# Patient Record
Sex: Female | Born: 1940 | Race: White | Hispanic: No | Smoking: Never smoker
Health system: Southern US, Community
[De-identification: ages and names within clinical notes are randomized; demographics above are authoritative.]

## PROBLEM LIST (undated history)

## (undated) DIAGNOSIS — Z9289 Personal history of other medical treatment: Secondary | ICD-10-CM

## (undated) DIAGNOSIS — I447 Left bundle-branch block, unspecified: Secondary | ICD-10-CM

## (undated) DIAGNOSIS — M47812 Spondylosis without myelopathy or radiculopathy, cervical region: Secondary | ICD-10-CM

## (undated) DIAGNOSIS — E785 Hyperlipidemia, unspecified: Secondary | ICD-10-CM

## (undated) DIAGNOSIS — I471 Supraventricular tachycardia, unspecified: Secondary | ICD-10-CM

## (undated) DIAGNOSIS — N189 Chronic kidney disease, unspecified: Secondary | ICD-10-CM

## (undated) DIAGNOSIS — I4819 Other persistent atrial fibrillation: Secondary | ICD-10-CM

## (undated) DIAGNOSIS — E039 Hypothyroidism, unspecified: Secondary | ICD-10-CM

## (undated) DIAGNOSIS — J40 Bronchitis, not specified as acute or chronic: Secondary | ICD-10-CM

## (undated) DIAGNOSIS — M79606 Pain in leg, unspecified: Secondary | ICD-10-CM

## (undated) DIAGNOSIS — I5042 Chronic combined systolic (congestive) and diastolic (congestive) heart failure: Secondary | ICD-10-CM

## (undated) DIAGNOSIS — M47816 Spondylosis without myelopathy or radiculopathy, lumbar region: Secondary | ICD-10-CM

## (undated) DIAGNOSIS — C801 Malignant (primary) neoplasm, unspecified: Secondary | ICD-10-CM

## (undated) DIAGNOSIS — I10 Essential (primary) hypertension: Secondary | ICD-10-CM

## (undated) DIAGNOSIS — F419 Anxiety disorder, unspecified: Secondary | ICD-10-CM

## (undated) HISTORY — DX: Spondylosis without myelopathy or radiculopathy, lumbar region: M47.816

## (undated) HISTORY — DX: Supraventricular tachycardia, unspecified: I47.10

## (undated) HISTORY — PX: EYE SURGERY: SHX253

## (undated) HISTORY — DX: Hypothyroidism, unspecified: E03.9

## (undated) HISTORY — DX: Bronchitis, not specified as acute or chronic: J40

## (undated) HISTORY — DX: Essential (primary) hypertension: I10

## (undated) HISTORY — DX: Chronic kidney disease, unspecified: N18.9

## (undated) HISTORY — DX: Left bundle-branch block, unspecified: I44.7

## (undated) HISTORY — DX: Anxiety disorder, unspecified: F41.9

## (undated) HISTORY — DX: Hyperlipidemia, unspecified: E78.5

## (undated) HISTORY — DX: Spondylosis without myelopathy or radiculopathy, cervical region: M47.812

## (undated) HISTORY — DX: Supraventricular tachycardia: I47.1

---

## 2001-09-09 ENCOUNTER — Ambulatory Visit (HOSPITAL_COMMUNITY): Admission: RE | Admit: 2001-09-09 | Discharge: 2001-09-09 | Payer: Self-pay | Admitting: Neurosurgery

## 2001-09-09 ENCOUNTER — Encounter: Payer: Self-pay | Admitting: Neurosurgery

## 2001-10-01 ENCOUNTER — Encounter: Admission: RE | Admit: 2001-10-01 | Discharge: 2001-10-01 | Payer: Self-pay | Admitting: Neurosurgery

## 2001-10-01 ENCOUNTER — Encounter: Payer: Self-pay | Admitting: Neurosurgery

## 2001-10-17 ENCOUNTER — Encounter: Payer: Self-pay | Admitting: Neurosurgery

## 2001-10-17 ENCOUNTER — Encounter: Admission: RE | Admit: 2001-10-17 | Discharge: 2001-10-17 | Payer: Self-pay | Admitting: Neurosurgery

## 2001-11-01 ENCOUNTER — Encounter: Admission: RE | Admit: 2001-11-01 | Discharge: 2001-11-01 | Payer: Self-pay | Admitting: Neurosurgery

## 2001-11-01 ENCOUNTER — Encounter: Payer: Self-pay | Admitting: Neurosurgery

## 2005-03-08 ENCOUNTER — Ambulatory Visit: Payer: Self-pay | Admitting: Family Medicine

## 2005-03-14 ENCOUNTER — Ambulatory Visit: Payer: Self-pay | Admitting: Family Medicine

## 2005-12-04 ENCOUNTER — Ambulatory Visit: Payer: Self-pay | Admitting: Family Medicine

## 2006-07-16 ENCOUNTER — Ambulatory Visit: Payer: Self-pay | Admitting: Family Medicine

## 2007-08-07 ENCOUNTER — Ambulatory Visit: Payer: Self-pay | Admitting: Family Medicine

## 2008-01-31 ENCOUNTER — Other Ambulatory Visit: Payer: Self-pay

## 2008-01-31 ENCOUNTER — Inpatient Hospital Stay: Payer: Self-pay | Admitting: Internal Medicine

## 2008-02-01 ENCOUNTER — Other Ambulatory Visit: Payer: Self-pay

## 2008-08-26 ENCOUNTER — Ambulatory Visit: Payer: Self-pay | Admitting: Family Medicine

## 2009-07-07 ENCOUNTER — Ambulatory Visit: Payer: Self-pay | Admitting: Ophthalmology

## 2009-07-19 ENCOUNTER — Ambulatory Visit: Payer: Self-pay | Admitting: Ophthalmology

## 2009-08-30 ENCOUNTER — Ambulatory Visit: Payer: Self-pay | Admitting: Family Medicine

## 2009-12-13 ENCOUNTER — Ambulatory Visit: Payer: Self-pay | Admitting: Family Medicine

## 2010-10-11 ENCOUNTER — Ambulatory Visit: Payer: Self-pay | Admitting: Unknown Physician Specialty

## 2011-11-09 ENCOUNTER — Ambulatory Visit: Payer: Self-pay | Admitting: Unknown Physician Specialty

## 2012-02-21 HISTORY — PX: OTHER SURGICAL HISTORY: SHX169

## 2012-02-23 ENCOUNTER — Inpatient Hospital Stay: Payer: Self-pay | Admitting: Internal Medicine

## 2012-02-23 LAB — CBC
HGB: 14.9 g/dL (ref 12.0–16.0)
MCV: 94 fL (ref 80–100)
RBC: 4.6 10*6/uL (ref 3.80–5.20)
WBC: 7.3 10*3/uL (ref 3.6–11.0)

## 2012-02-23 LAB — CK TOTAL AND CKMB (NOT AT ARMC)
CK, Total: 66 U/L (ref 21–215)
CK, Total: 65 U/L (ref 21–215)
CK-MB: 1.2 ng/mL (ref 0.5–3.6)

## 2012-02-23 LAB — COMPREHENSIVE METABOLIC PANEL
Albumin: 4 g/dL (ref 3.4–5.0)
Anion Gap: 8 (ref 7–16)
BUN: 20 mg/dL — ABNORMAL HIGH (ref 7–18)
Bilirubin,Total: 0.5 mg/dL (ref 0.2–1.0)
Chloride: 111 mmol/L — ABNORMAL HIGH (ref 98–107)
Co2: 23 mmol/L (ref 21–32)
Glucose: 136 mg/dL — ABNORMAL HIGH (ref 65–99)
Potassium: 4.1 mmol/L (ref 3.5–5.1)
SGPT (ALT): 21 U/L
Sodium: 142 mmol/L (ref 136–145)

## 2012-02-23 LAB — PROTIME-INR: Prothrombin Time: 12.8 secs (ref 11.5–14.7)

## 2012-02-23 LAB — TROPONIN I: Troponin-I: 0.1 ng/mL — ABNORMAL HIGH

## 2012-02-23 LAB — APTT: Activated PTT: 70.4 secs — ABNORMAL HIGH (ref 23.6–35.9)

## 2012-02-24 DIAGNOSIS — R000 Tachycardia, unspecified: Secondary | ICD-10-CM

## 2012-02-24 LAB — BASIC METABOLIC PANEL
BUN: 18 mg/dL (ref 7–18)
Calcium, Total: 8.8 mg/dL (ref 8.5–10.1)
Chloride: 109 mmol/L — ABNORMAL HIGH (ref 98–107)
EGFR (African American): >60
Osmolality: 287 (ref 275–301)
Potassium: 3.4 mmol/L — ABNORMAL LOW (ref 3.5–5.1)
Sodium: 143 mmol/L (ref 136–145)

## 2012-02-24 LAB — CK TOTAL AND CKMB (NOT AT ARMC): CK, Total: 47 U/L (ref 21–215)

## 2012-02-24 LAB — CBC WITH DIFFERENTIAL/PLATELET
Basophil #: 0 10*3/uL (ref 0.0–0.1)
Basophil %: 0.4 %
Eosinophil #: 0.1 10*3/uL (ref 0.0–0.7)
Eosinophil %: 1.2 %
HCT: 38 % (ref 35.0–47.0)
HGB: 13.2 g/dL (ref 12.0–16.0)
Lymphocyte %: 26.3 %
MCH: 32.3 pg (ref 26.0–34.0)
Monocyte #: 0.7 x10 3/mm (ref 0.2–0.9)
Neutrophil #: 6.8 10*3/uL — ABNORMAL HIGH (ref 1.4–6.5)
Neutrophil %: 65.8 %
Platelet: 187 10*3/uL (ref 150–440)
RDW: 13.5 % (ref 11.5–14.5)

## 2012-02-24 LAB — MAGNESIUM
Magnesium: 1.9 mg/dL
Magnesium: 1.8 mg/dL

## 2012-02-24 LAB — TROPONIN I: Troponin-I: 0.08 ng/mL — ABNORMAL HIGH

## 2012-02-24 LAB — APTT
Activated PTT: 65.9 secs — ABNORMAL HIGH (ref 23.6–35.9)
Activated PTT: 61.2 secs — ABNORMAL HIGH (ref 23.6–35.9)

## 2012-02-24 LAB — T4, FREE: Free Thyroxine: 1.42 ng/dL (ref 0.76–1.46)

## 2012-02-26 ENCOUNTER — Telehealth: Payer: Self-pay | Admitting: Physician Assistant

## 2012-02-26 ENCOUNTER — Other Ambulatory Visit: Payer: Self-pay | Admitting: Physician Assistant

## 2012-02-26 ENCOUNTER — Telehealth: Payer: Self-pay | Admitting: Internal Medicine

## 2012-02-26 DIAGNOSIS — R002 Palpitations: Secondary | ICD-10-CM

## 2012-02-26 NOTE — Telephone Encounter (Addendum)
Rec'd a call from Dr Johney Frame. [name] was seen in [place] and is coming to [facility] 5/7 for procedure. She is to be at [facility] at 5:30 am. Nehemiah Settle has H&P with her.   Called [facility] to let them know, no answer.   Tried to write orders, they are signed and held.  Left note for Lillia Abed and extenders to let them know situation.

## 2012-02-26 NOTE — Telephone Encounter (Signed)
Pt's dtr calling re procedure to be scheduled for pt this week per dr allred who saw her at [facility]

## 2012-02-26 NOTE — Telephone Encounter (Signed)
Pt is scheduled for EP study and ablation with Dr Ladona Ridgel 02/27/12 7:30 am by Dr Ladona Ridgel. She will arrive to short stay at 5:30am. She will hold toprol overnight tonight. Orders for EP study and ablation will need to be placed in the am.

## 2012-02-26 NOTE — Telephone Encounter (Signed)
Patient called stated Dr.Allred saw her this past Saturday and was told she would get a phone call about a procedure that needs to be scheduled.Message fowarded to Dr.Allred's nurse.

## 2012-02-27 ENCOUNTER — Encounter (HOSPITAL_COMMUNITY): Payer: Self-pay | Admitting: *Deleted

## 2012-02-27 ENCOUNTER — Ambulatory Visit (HOSPITAL_COMMUNITY)
Admission: RE | Admit: 2012-02-27 | Discharge: 2012-02-28 | Disposition: A | Discharge status: Home / Self Care | Payer: Medicare Other | Source: Ambulatory Visit | Attending: Internal Medicine | Admitting: Internal Medicine

## 2012-02-27 ENCOUNTER — Encounter (HOSPITAL_COMMUNITY)
Admission: RE | Disposition: A | Discharge status: Home / Self Care | Payer: Self-pay | Source: Ambulatory Visit | Attending: Internal Medicine

## 2012-02-27 DIAGNOSIS — I471 Supraventricular tachycardia: Secondary | ICD-10-CM

## 2012-02-27 DIAGNOSIS — Z8679 Personal history of other diseases of the circulatory system: Secondary | ICD-10-CM

## 2012-02-27 DIAGNOSIS — I498 Other specified cardiac arrhythmias: Secondary | ICD-10-CM | POA: Insufficient documentation

## 2012-02-27 DIAGNOSIS — I447 Left bundle-branch block, unspecified: Secondary | ICD-10-CM | POA: Insufficient documentation

## 2012-02-27 HISTORY — PX: ELECTROPHYSIOLOGY STUDY: SHX5467

## 2012-02-27 HISTORY — PX: SUPRAVENTRICULAR TACHYCARDIA ABLATION: SHX5492

## 2012-02-27 LAB — CBC
MCH: 32.8 pg (ref 26.0–34.0)
MCHC: 36.1 g/dL — ABNORMAL HIGH (ref 30.0–36.0)
MCV: 90.7 fL (ref 78.0–100.0)
Platelets: 191 10*3/uL (ref 150–400)
WBC: 7.5 10*3/uL (ref 4.0–10.5)

## 2012-02-27 LAB — APTT: aPTT: 26 seconds (ref 24–37)

## 2012-02-27 LAB — BASIC METABOLIC PANEL
CO2: 25 mEq/L (ref 19–32)
Calcium: 9.9 mg/dL (ref 8.4–10.5)
Creatinine, Ser: 0.71 mg/dL (ref 0.50–1.10)
GFR calc non Af Amer: 86 mL/min — ABNORMAL LOW (ref >90)

## 2012-02-27 SURGERY — ELECTROPHYSIOLOGY STUDY
Anesthesia: LOCAL

## 2012-02-27 MED ORDER — MIDAZOLAM HCL 5 MG/5ML IJ SOLN
INTRAMUSCULAR | Status: AC
  Filled 2012-02-27: qty 5

## 2012-02-27 MED ORDER — HYDROXYUREA 500 MG PO CAPS
ORAL_CAPSULE | ORAL | Status: AC
  Filled 2012-02-27: qty 1

## 2012-02-27 MED ORDER — ACETAMINOPHEN 325 MG PO TABS: 650.0000 mg | ORAL_TABLET | ORAL | Status: DC | PRN

## 2012-02-27 MED ORDER — METOPROLOL SUCCINATE ER 50 MG PO TB24
50.0000 mg | ORAL_TABLET | Freq: Every day | ORAL | Status: DC
  Administered 2012-02-27: 50 mg via ORAL
  Filled 2012-02-27 (×2): qty 1

## 2012-02-27 MED ORDER — POTASSIUM CHLORIDE ER 10 MEQ PO TBCR
10.0000 meq | EXTENDED_RELEASE_TABLET | Freq: Every day | ORAL | Status: DC
  Administered 2012-02-27: 10 meq via ORAL
  Filled 2012-02-27 (×2): qty 1

## 2012-02-27 MED ORDER — LEVOTHYROXINE SODIUM 112 MCG PO TABS
112.0000 ug | ORAL_TABLET | Freq: Every day | ORAL | Status: DC
  Filled 2012-02-27 (×2): qty 1

## 2012-02-27 MED ORDER — ASPIRIN EC 81 MG PO TBEC
81.0000 mg | DELAYED_RELEASE_TABLET | Freq: Every day | ORAL | Status: DC
  Administered 2012-02-27: 81 mg via ORAL
  Filled 2012-02-27 (×2): qty 1

## 2012-02-27 MED ORDER — SIMVASTATIN 20 MG PO TABS
20.0000 mg | ORAL_TABLET | Freq: Every day | ORAL | Status: DC
  Administered 2012-02-27: 20 mg via ORAL
  Filled 2012-02-27 (×2): qty 1

## 2012-02-27 MED ORDER — BUSPIRONE HCL 15 MG PO TABS
7.5000 mg | ORAL_TABLET | Freq: Two times a day (BID) | ORAL | Status: DC
  Administered 2012-02-27 (×2): 7.5 mg via ORAL
  Filled 2012-02-27 (×5): qty 1

## 2012-02-27 MED ORDER — ADULT MULTIVITAMIN W/MINERALS CH
1.0000 | ORAL_TABLET | Freq: Every day | ORAL | Status: DC
  Administered 2012-02-27: 1 via ORAL
  Filled 2012-02-27 (×2): qty 1

## 2012-02-27 MED ORDER — HYDROCHLOROTHIAZIDE 12.5 MG PO CAPS
12.5000 mg | ORAL_CAPSULE | Freq: Every day | ORAL | Status: DC
  Administered 2012-02-27: 12.5 mg via ORAL
  Filled 2012-02-27 (×2): qty 1

## 2012-02-27 MED ORDER — LOSARTAN POTASSIUM 50 MG PO TABS
100.0000 mg | ORAL_TABLET | Freq: Every day | ORAL | Status: DC
  Administered 2012-02-27: 100 mg via ORAL
  Filled 2012-02-27 (×2): qty 2

## 2012-02-27 MED ORDER — LOSARTAN POTASSIUM-HCTZ 100-12.5 MG PO TABS: 1.0000 | ORAL_TABLET | Freq: Every day | ORAL | Status: DC

## 2012-02-27 MED ORDER — FENTANYL CITRATE 0.05 MG/ML IJ SOLN
INTRAMUSCULAR | Status: AC
  Filled 2012-02-27: qty 2

## 2012-02-27 MED ORDER — FAMOTIDINE 20 MG PO TABS
20.0000 mg | ORAL_TABLET | Freq: Two times a day (BID) | ORAL | Status: DC
  Administered 2012-02-27: 20 mg via ORAL
  Filled 2012-02-27 (×3): qty 1

## 2012-02-27 MED ORDER — LORATADINE 10 MG PO TABS
10.0000 mg | ORAL_TABLET | Freq: Every day | ORAL | Status: DC
  Administered 2012-02-27: 10 mg via ORAL
  Filled 2012-02-27 (×2): qty 1

## 2012-02-27 MED ORDER — SODIUM CHLORIDE 0.9 % IV SOLN: 250.0000 mL | INTRAVENOUS | Status: DC | PRN

## 2012-02-27 MED ORDER — HEPARIN (PORCINE) IN NACL 2-0.9 UNIT/ML-% IJ SOLN
INTRAMUSCULAR | Status: AC
  Filled 2012-02-27: qty 1000

## 2012-02-27 MED ORDER — ONDANSETRON HCL 4 MG/2ML IJ SOLN: 4.0000 mg | Freq: Four times a day (QID) | INTRAMUSCULAR | Status: DC | PRN

## 2012-02-27 MED ORDER — ACETAMINOPHEN 325 MG PO TABS: 650.0000 mg | ORAL_TABLET | Freq: Four times a day (QID) | ORAL | Status: DC | PRN

## 2012-02-27 MED ORDER — SODIUM CHLORIDE 0.9 % IV SOLN
INTRAVENOUS | Status: DC
  Administered 2012-02-27: 1000 mL via INTRAVENOUS

## 2012-02-27 MED ORDER — BUPIVACAINE HCL (PF) 0.25 % IJ SOLN
INTRAMUSCULAR | Status: AC
  Filled 2012-02-27: qty 60

## 2012-02-27 MED ORDER — CYCLOBENZAPRINE HCL 10 MG PO TABS
10.0000 mg | ORAL_TABLET | Freq: Every day | ORAL | Status: DC
Start: 2012-02-27 — End: 2012-02-28
  Administered 2012-02-27: 10 mg via ORAL
  Filled 2012-02-27 (×2): qty 1

## 2012-02-27 NOTE — Op Note (Signed)
EPS/RFA of NS AVNRT performed without immediate complication. [id number].

## 2012-02-27 NOTE — Op Note (Signed)
[name], [name] NO.:  [id number]  MEDICAL RECORD NO.:  [id number]  LOCATION:  3729                         FACILITY:  [facility]  PHYSICIAN:  Doylene Canning. 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  INTRODUCTION:  The patient is a very pleasant 71 year old woman with recurrent tachy palpitations and documented left bundle-branch block tachycardia at rates of over 200 beats per minute.  She presented several days ago and was treated with amiodarone initially and was discharged home.  She now presents for catheter ablation.  PROCEDURE:  After informed was obtained, the patient was taken to the diagnostic EP lab in a fasting state.  After usual preparation and draping, intravenous fentanyl and midazolam were given for sedation.  A 6-French hexapolar catheter was inserted percutaneously into the right jugular vein and advanced to coronary sinus.  A 6-French quadripolar catheter was inserted percutaneously in the right femoral vein and advanced to the right ventricle.  A 6-French quadripolar catheter was inserted percutaneously in the right femoral vein and advanced to His bundle region.  After measurement of the basic intervals, rapid ventricular pacing was carried out from the right ventricle demonstrating VA dissociation at 600 msec.  Programmed ventricular stimulation was carried out from the right ventricle at S1 at base drive cycle length of 147 msec.  S1, S2; S1, S2, S3; and S1, S2, S3, S4 stimuli were delivered with no inducible VT induced.  At this point, programmed atrial stimulation was carried out from the coronary sinus in the right atrium  at base drive cycle length of 829, 500, and 400 msec. The S1, S2 interval was stepwise decreased down to the AV node ERP, and then on down to atrial refractoriness.  During programmed atrial stimulation, the PR interval was greater than the RR interval.  There are multiple AH jumps, echo beats, double echo beats, and triple echo beats, but there was no inducible sustained tachycardia.  Rapid atrial pacing was carried out from the coronary sinus and high right atrium, paced cycle length of 600 msec and stepwise decreased down to 200 msec where atrial refractoriness was observed.  During rapid atrial pacing, the AV Wenckebach cycle length was 390 msec.  At this point, isoproterenol was infused at rates from 1-4 mcg per minute, and additional rapid atrial and ventricular pacing, programmed atrial and ventricular stimulation were carried out.  There was no sustained tachycardia induced.  At this point, a decision was made because there was no inducible ventricular tachycardia and because there was no evidence of accessory pathway conduction, and because there was evidence of dual AV nodal physiology as well as nonsustained SVT, the decision was made to proceed with slow pathway modification.  The 7-French quadripolar ablation catheter was inserted percutaneously into the right femoral vein and advanced to the right atrium.  Mapping of Koch's triangle was carried out.  It was oriented vertically and was extraordinarily small.  There were 2 catheter widths between the AV node and the slow pathway region.  Additional displacements of the catheter resulted in no atrial signal.  At this point, a total of 5 very brief RF energy applications were delivered resulting in accelerated junctional rhythm.  At times, there was VA block noted and the ablation catheter was immediately removed and RF was discontinued.  Following the final RF energy application, there remained AH jumps but no echo beats,  no inducible SVT, and the PR interval was less than the RR interval.  The decision was made at this point because of concerns of resulting in complete heart block and because we had not a very good definite endpoint to discontinue any additional ablation attempts.  It appeared a slow pathway modification had been successfully completed.  The catheters were then removed.  Hemostasis was assured, and the patient was returned to her room in satisfactory condition.  COMPLICATIONS:  There are no immediate procedure complications.  RESULTS:  A.  Baseline ECG:  Baseline ECG demonstrates sinus rhythm with left bundle-branch block. B.  Baseline intervals:  QRS duration was 175 msec, HV interval 65 msec, the AH interval was 115 msec.  The sinus node cycle length rather was 828 msec. C.  Rapid ventricular pacing:  At baseline, rapid ventricular pacing demonstrated VA dissociation at 600 msec.  On isoproterenol,  VA dissociation was present at 500 msec.  Rapid ventricular pacing was carried out down to 270 msec.  There was no inducible VT. D.  Programmed ventricular stimulation:  Programmed ventricular stimulation was carried out from the right ventricle at a base drive cycle length of 161 msec.  The S1-S2 interval stepwise decreased down to 220 msec where ventricular refractoriness was observed.  During programmed ventricular stimulation, there was VA dissociation at baseline, and there was no inducible SVT.  It should be noted that additional programmed stimulation was carried out from the ventricle and attempts to induce ventricular tachycardia with S1, S2, S3 and S1, S2, S3, S4 stimuli carried out, and decremented all way down to ventricular refractoriness.  Despite an aggressive pacing protocol, there was no inducible VT. E.  Rapid atrial pacing:  Rapid atrial pacing was carried out from the coronary sinus in the right atrium at base drive cycle length of 096 msec, stepwise decreased down  to 200 msec.  There were no sustained inducible arrhythmias.  The PR interval was greater than the RR interval initially prior to catheter ablation. F.  Programmed atrial stimulation:  Programmed atrial stimulation was carried out from the coronary sinus as well as the high right atrium at base drive cycle lengths of 045, 500, and 400 msec.  S1, S2 and S1, S2, S3 stimuli were delivered.  At baseline, the AV node ERP was noted at 600/340.  During programmed atrial stimulation with and without isoproterenol, there were echo beats, double echo beats, and triple echo beats.  There was also nonsustained SVT.  This was left bundle branch block SVT with one-to-one VA conduction.  The tachycardia would always terminate with block in the AV node. G.  Arrhythmias observed. 1. Nonsustained AV node reentry tachycardia, duration was     nonsustained, termination was spontaneous, QRS morphology was left     bundle-branch block.     a.     Mapping.  Mapping demonstrated a almost vertical Koch's      triangle which was exceedingly small.  b.     RF energy application.  A total of 5 brief RF energy      applications were delivered into Koch's triangle.  RF energy was      applied approximately 1 and 2 catheter widths way from the AV      node.  Accelerated junctional rhythm was observed with transient      VA block resulting in immediate discontinuation of the ablation of      the RF energy and movement of the ablation catheter.  CONCLUSION:  This study demonstrates inducible nonsustained AV node reentry tachycardia and apparent slow pathway modification.  The results of ablation are not as clear cut in that the patient's tachycardia could not be sustained.  With all of the above criteria, however, it was believed that the most likely explanation for the patient's SVT was that she had AV node reentrant tachycardia utilizing a left bundle-branch block aberrancy.  In addition, the patient's Koch's  triangle was unusually small and vertically oriented making ablation quite difficult.     Doylene Canning. Ladona Ridgel, MD     GWT/MEDQ  D:  02/27/2012  T:  02/27/2012  Job:  [id number]  cc:   Harold Hedge, MD Hillis Range, MD

## 2012-02-27 NOTE — H&P (Signed)
[name] is an 71 y.o. female.   Chief Complaint: I am here for an ablation HPI: 71 yo woman with a h/o recurrent tachypalpitations admitted for EPS/RFA of LBBB tachycardia. I have reviewed the history as documented by Dr. Johney Frame. Briefly she has had a h/o tachypalpitations over the past 3 years, refractory to medical therapy on beta blockers. She was in her usual state of health until presenting with a LBBB tachycadia at 220/min. She was given amio and the tachy terminated. She desires to have this ablated if possible as she experiences shortness of breath with her tachypalpitations but has never had syncope.  No past medical history on file.  No past surgical history on file.  No family history on file. Social History:  does not have a smoking history on file. She does not have any smokeless tobacco history on file. Her alcohol and drug histories not on file.  Allergies:  Allergies  Allergen Reactions  . Codeine Nausea And Vomiting  . Erythromycin Swelling  . Penicillins Swelling  . Septra (Sulfamethoxazole W-Trimethoprim) Swelling    Medications Prior to Admission  Medication Sig Dispense Refill  . acetaminophen (TYLENOL) 325 MG tablet Take 650 mg by mouth every 6 (six) hours as needed. For pain      . aspirin EC 81 MG tablet Take 81 mg by mouth daily.      Marland Kitchen atorvastatin (LIPITOR) 20 MG tablet Take 20 mg by mouth daily.      . busPIRone (BUSPAR) 5 MG tablet Take 7.5 mg by mouth 2 (two) times daily.      . Calcium Carbonate (CALTRATE 600 PO) Take 1,800 mg by mouth 3 (three) times daily.      . cyclobenzaprine (FLEXERIL) 10 MG tablet Take 10 mg by mouth daily.      Marland Kitchen levothyroxine (SYNTHROID, LEVOTHROID) 112 MCG tablet Take 112 mcg by mouth daily.      Marland Kitchen loratadine (CLARITIN) 10 MG tablet Take 10 mg by mouth daily.      Marland Kitchen losartan-hydrochlorothiazide (HYZAAR) 100-12.5 MG per tablet Take 1 tablet by mouth daily.      . metoprolol succinate (TOPROL-XL) 50 MG 24 hr tablet Take  50 mg by mouth daily. Take with or immediately following a meal.      . Multiple Vitamin (MULITIVITAMIN WITH MINERALS) TABS Take 1 tablet by mouth daily.      . potassium chloride (K-DUR) 10 MEQ tablet Take 10 mEq by mouth daily.        Results for orders placed during the hospital encounter of 02/27/12 (from the past 48 hour(s))  CBC     Status: Abnormal   Collection Time   02/27/12  6:07 AM      Component Value Range Comment   WBC 7.5  4.0 - 10.5 (K/uL)    RBC 4.61  3.87 - 5.11 (MIL/uL)    Hemoglobin 15.1 (*) 12.0 - 15.0 (g/dL)    HCT 40.9  81.1 - 91.4 (%)    MCV 90.7  78.0 - 100.0 (fL)    MCH 32.8  26.0 - 34.0 (pg)    MCHC 36.1 (*) 30.0 - 36.0 (g/dL)    RDW 78.2  95.6 - 21.3 (%)    Platelets 191  150 - 400 (K/uL)   BASIC METABOLIC PANEL     Status: Abnormal   Collection Time   02/27/12  6:07 AM      Component Value Range Comment   Sodium 138  135 - 145 (  mEq/L)    Potassium 3.8  3.5 - 5.1 (mEq/L)    Chloride 101  96 - 112 (mEq/L)    CO2 25  19 - 32 (mEq/L)    Glucose, Bld 114 (*) 70 - 99 (mg/dL)    BUN 18  6 - 23 (mg/dL)    Creatinine, Ser 1.61  0.50 - 1.10 (mg/dL)    Calcium 9.9  8.4 - 10.5 (mg/dL)    GFR calc non Af Amer 86 (*) >90 (mL/min)    GFR calc Af Amer >90  >90 (mL/min)   APTT     Status: Normal   Collection Time   02/27/12  6:07 AM      Component Value Range Comment   aPTT 26  24 - 37 (seconds)   PROTIME-INR     Status: Normal   Collection Time   02/27/12  6:07 AM      Component Value Range Comment   Prothrombin Time 12.5  11.6 - 15.2 (seconds)    INR 0.91  0.00 - 1.49     No results found.  As noted in the HPI. All systems reviewed and negative except for arthritis  PE  Blood pressure 145/71, pulse 85, temperature 96.9 F (36.1 C), temperature source Oral, resp. rate 18, height 5\' 6"  (1.676 m), weight 152 lb (68.947 kg), SpO2 94.00%. Well appearing NAD HEENT: Unremarkable Neck:  No JVD, no thyromegally Lymphatics:  No adenopathy Back:  No CVA  tenderness Lungs:  Clear HEART:  Regular rate rhythm, no murmurs, no rubs, no clicks Abd:  Flat, positive bowel sounds, no organomegally, no rebound, no guarding Ext:  2 plus pulses, no edema, no cyanosis, no clubbing Skin:  No rashes no nodules Neuro:  CN II through XII intact, motor grossly intact EKG: NSR with LBBB.  Assessment/Plan 1.  Recurrent medically refractory LBBB tachycardia - I have discussed the risk/benfit/goals/expectations of catheter ablation and she wishes to proceed. There is about 5/100 of heart block requiring PPM.   Lewayne Bunting 02/27/2012, 7:12 AM

## 2012-02-28 DIAGNOSIS — I471 Supraventricular tachycardia: Secondary | ICD-10-CM

## 2012-02-28 NOTE — Progress Notes (Signed)
Pt and family provied with d/c instructions and education. Pt and family verbalized understanding. All questions answered. Pt appears stable for discharge. No needs at this time. IV removed with tip intact. Heart monitor returned to front. Pt is d/c home with daughter and left floor via wheelchair. Ramond Craver, Rn

## 2012-02-28 NOTE — Discharge Summary (Signed)
ELECTROPHYSIOLOGY PROCEDURE DISCHARGE SUMMARY    Patient ID: [name],  MRN: [id number], DOB/AGE: 1941-09-23 71 y.o.  Admit date: 02/27/2012 Discharge date: 02/28/2012  Primary Care Physician: No primary provider on file. Primary Cardiologist: Dr. Mariel Kansky  Primary Discharge Diagnosis:  1.  AVNRT s/p RF ablation  Secondary Discharge Diagnosis:  1.  Chronic LBBB  Procedures Performed:  1.  ELECTROPHYSIOLOGY STUDY AND SUPRAVENTRICULAR TACHYCARDIA ABLATION Please see full dictated report by Dr. Lewayne Bunting. Briefly, arrhythmias observed/induced - nonsustained AV node reentry tachycardia, duration was nonsustained, termination was spontaneous, QRS morphology was left bundle-branch block. Mapping demonstrated an almost vertical Koch's triangle which was exceedingly small. RF energy application. A total of 5 brief RF energy applications were delivered into Koch's triangle. RF energy was applied approximately 1 and 2 catheter widths way from the AV node. Accelerated junctional rhythm was observed with transient VA block resulting in immediate discontinuation of the ablation of the RF energy and movement of the ablation catheter. CONCLUSION: This study demonstrates inducible nonsustained AV node reentry tachycardia and apparent slow pathway modification. The results of ablation are not as clear cut in that the patient's tachycardia could not be sustained. With all of the above criteria, however, it was believed that the most likely explanation for the patient's SVT was that she had AV node reentrant tachycardia utilizing a left bundle-branch block aberrancy. In addition, the patient's Koch's triangle was unusually small and vertically oriented making ablation quite difficult.  History and Hospital Course:  Please see admission H&P for full details. Briefly, [name] is a pleasant 71 year old woman with recurrent tachy palpitations x 3 years and documented left bundle-branch block tachycardia at  rates of over 200 beats per minute. She presented to [facility] several days ago and was treated with amiodarone initially then discharged home. She was evaluated by Dr. Johney Frame in consultation and decided to proceed with EP study and possible catheter ablation. She presented for EPS +/- ablation on 02/27/2012. After informed consent was obtained, [name] underwent an EP study with inducible nonsustained AV nodal reentrant tachycardia utilizing a LBBB aberrancy, as outlined above. She then underwent slow pathway modification. The results of ablation are not as clear cut in that the patient's tachycardia could not be sustained. She tolerated the procedure well with no immediate complications. She remains hemodynamically stable, afebrile and has been ambulating around the room without difficulty. Her groin site is intact with no evidence of edema, erythema, warmth, pain, drainage, bleeding or hematoma. Her telemetry shows NSR with rare PVCs. She has been seen, examined and deemed stable for discharge to home today by Dr. Lewayne Bunting.  Discharge Vitals: Blood pressure 112/46, pulse 67, temperature 97.6 F (36.4 C), temperature source Axillary, resp. rate 16, height 5\' 6"  (1.676 m), weight 152 lb (68.947 kg), SpO2 100.00%.   Labs: Lab Results  Component Value Date   WBC 7.5 02/27/2012   HGB 15.1* 02/27/2012   HCT 41.8 02/27/2012   MCV 90.7 02/27/2012   PLT 191 02/27/2012     Lab 02/27/12 0607  NA 138  K 3.8  CL 101  CO2 25  BUN 18  CREATININE 0.71  CALCIUM 9.9  PROT --  BILITOT --  ALKPHOS --  ALT --  AST --  GLUCOSE 114*    Discharge Medications:  Medication List  As of 02/28/2012  7:58 AM   STOP taking these medications     metoprolol succinate 50 MG 24 hr tablet  TAKE these medications     acetaminophen 325 MG tablet   Commonly known as: TYLENOL   Take 650 mg by mouth every 6 (six) hours as needed. For pain      aspirin EC 81 MG tablet   Take 81 mg by mouth  daily.      atorvastatin 20 MG tablet   Commonly known as: LIPITOR   Take 20 mg by mouth daily.      busPIRone 5 MG tablet   Commonly known as: BUSPAR   Take 7.5 mg by mouth 2 (two) times daily.      CALTRATE 600 PO   Take 1,800 mg by mouth 3 (three) times daily.      cyclobenzaprine 10 MG tablet   Commonly known as: FLEXERIL   Take 10 mg by mouth daily.      levothyroxine 112 MCG tablet   Commonly known as: SYNTHROID, LEVOTHROID   Take 112 mcg by mouth daily.      loratadine 10 MG tablet   Commonly known as: CLARITIN   Take 10 mg by mouth daily.      losartan-hydrochlorothiazide 100-12.5 MG per tablet   Commonly known as: HYZAAR   Take 1 tablet by mouth daily.      mulitivitamin with minerals Tabs   Take 1 tablet by mouth daily.      potassium chloride 10 MEQ tablet   Commonly known as: K-DUR   Take 10 mEq by mouth daily.       Disposition:  [name] is being discharged in stable condition.  Follow-up: Follow-up Information    Follow up with Hillis Range, MD. (In 3 months; our office will call to notify you of appointment date and time)    Contact information:   [address] [place] [address] [phone]   Duration of Discharge Encounter: Greater than 30 minutes including physician time.  Signed, Minda Meo., PA-C 02/28/2012, 7:58 AM  Cardiology Attending  Patient seen and examined. Agree with above assessment and plan. Her exam is normal this morning and her Tele demonstrates NSR with LBBB. She will followup as above.  Lewayne Bunting, M.D.

## 2012-02-28 NOTE — Discharge Instructions (Signed)
Keep procedure site clean & dry. If you notice increased pain, swelling, bleeding or pus, contact our office - [facility] [phone].  You may shower, but no soaking baths/hot tubs/pools for 1 week. No driving for 2 days. No lifting over 5 lbs for 5 days.

## 2012-05-27 ENCOUNTER — Ambulatory Visit (INDEPENDENT_AMBULATORY_CARE_PROVIDER_SITE_OTHER): Payer: Medicare Other | Admitting: Internal Medicine

## 2012-05-27 ENCOUNTER — Encounter: Payer: Self-pay | Admitting: Internal Medicine

## 2012-05-27 VITALS — BP 140/92 | HR 82 | Resp 18 | Ht 66.0 in | Wt 157.0 lb

## 2012-05-27 DIAGNOSIS — I471 Supraventricular tachycardia, unspecified: Secondary | ICD-10-CM | POA: Insufficient documentation

## 2012-05-27 DIAGNOSIS — I10 Essential (primary) hypertension: Secondary | ICD-10-CM | POA: Insufficient documentation

## 2012-05-27 DIAGNOSIS — I498 Other specified cardiac arrhythmias: Secondary | ICD-10-CM

## 2012-05-27 NOTE — Patient Instructions (Addendum)
Your physician wants you to follow-up in: 6 months with Dr Taylor in [place]. You will receive a reminder letter in the mail two months in advance. If you don't receive a letter, please call our office to schedule the follow-up appointment.  

## 2012-05-27 NOTE — Assessment & Plan Note (Signed)
Elevated today, though she reports good BP control at home. She does not wish to have her medicines adjusted today.

## 2012-05-27 NOTE — Assessment & Plan Note (Signed)
Doing well s/p ablation No further workup planned  She would like to follow-up with Dr Ladona Ridgel in [place]. I will see her as needed.

## 2012-05-27 NOTE — Progress Notes (Signed)
  [name] is a 71 y.o. female who presents today for routine electrophysiology followup.  Since her SVT ablation by Dr Ladona Ridgel, the patient reports doing very well.  She reports brief tachypalpitations which woke her from sleep a month ago but denies any other sustained arrhythmias.  Today, she denies symptoms of palpitations, chest pain, shortness of breath,  lower extremity edema, dizziness, presyncope, or syncope.  The patient is otherwise without complaint today.   Past Medical History  Diagnosis Date  . SVT (supraventricular tachycardia)     s/p ablation by Dr Ladona Ridgel 5/13  . LBBB (left bundle branch block)   . Hypertension   . Hypothyroid   . Hyperlipidemia    Past Surgical History  Procedure Date  . Eps and ablation for svt 5/13    slow pathway ablation by Dr Ladona Ridgel    Current Outpatient Prescriptions  Medication Sig Dispense Refill  . acetaminophen (TYLENOL) 325 MG tablet Take 650 mg by mouth every 6 (six) hours as needed. For pain      . aspirin EC 81 MG tablet Take 81 mg by mouth daily.      Marland Kitchen atorvastatin (LIPITOR) 20 MG tablet Take 20 mg by mouth daily.      . busPIRone (BUSPAR) 5 MG tablet Take 7.5 mg by mouth 2 (two) times daily.      . Calcium Carbonate (CALTRATE 600 PO) Take 1,800 mg by mouth 3 (three) times daily.      . cyclobenzaprine (FLEXERIL) 10 MG tablet Take 10 mg by mouth daily.      Marland Kitchen levothyroxine (SYNTHROID, LEVOTHROID) 112 MCG tablet Take 112 mcg by mouth daily.      Marland Kitchen loratadine (CLARITIN) 10 MG tablet Take 10 mg by mouth daily.      Marland Kitchen losartan-hydrochlorothiazide (HYZAAR) 100-12.5 MG per tablet Take 1 tablet by mouth daily.      . Multiple Vitamin (MULITIVITAMIN WITH MINERALS) TABS Take 1 tablet by mouth daily.      Marland Kitchen PATADAY 0.2 % SOLN       . potassium chloride (K-DUR) 10 MEQ tablet Take 10 mEq by mouth daily.        Physical Exam: Filed Vitals:   05/27/12 1604  BP: 140/92  Pulse: 82  Resp: 18  Height: 5\' 6"  (1.676 m)  Weight: 157 lb  (71.215 kg)  SpO2: 98%    GEN- The patient is well appearing, alert and oriented x 3 today.   Head- normocephalic, atraumatic Eyes-  Sclera clear, conjunctiva pink Ears- hearing intact Oropharynx- clear Lungs- Clear to ausculation bilaterally, normal work of breathing Heart- Regular rate and rhythm, no murmurs, rubs or gallops, PMI not laterally displaced GI- soft, NT, ND, + BS Extremities- no clubbing, cyanosis, or edema  ekg today reveals sinus rhythm, LBBB Assessment and Plan:

## 2012-12-10 ENCOUNTER — Ambulatory Visit (INDEPENDENT_AMBULATORY_CARE_PROVIDER_SITE_OTHER): Payer: Medicare Other | Admitting: Internal Medicine

## 2012-12-10 ENCOUNTER — Encounter: Payer: Self-pay | Admitting: Internal Medicine

## 2012-12-10 VITALS — BP 158/80 | HR 90 | Ht 66.0 in | Wt 156.0 lb

## 2012-12-10 DIAGNOSIS — I10 Essential (primary) hypertension: Secondary | ICD-10-CM

## 2012-12-10 DIAGNOSIS — R079 Chest pain, unspecified: Secondary | ICD-10-CM

## 2012-12-10 DIAGNOSIS — I471 Supraventricular tachycardia: Secondary | ICD-10-CM

## 2012-12-10 DIAGNOSIS — I498 Other specified cardiac arrhythmias: Secondary | ICD-10-CM

## 2012-12-10 DIAGNOSIS — R0602 Shortness of breath: Secondary | ICD-10-CM

## 2012-12-10 NOTE — Assessment & Plan Note (Signed)
Her blood pressure is elevated today. I've asked her to reduce her salt intake. She is on both metoprolol and amlodipine. She states that her blood pressure has been well-controlled. If her blood pressure remains elevated, we would consider up titration of her calcium channel blocker.

## 2012-12-10 NOTE — Progress Notes (Signed)
HPI Mrs. [name] returns today for followup. She is a very pleasant 72 year old woman with a history of left bundle branch block tachycardia, who underwent electrophysiologic study and catheter ablation of AV node reentrant tachycardia several months ago. In the interim, she has had intermittent palpitations, and heart rates of up 100 or 110 beats per minute. Sometimes her episodes are associated with exertion and sometimes not. She denies chest pain. No peripheral edema. Previously, she would have tachycardia palpitations at rates of over 200 beats per minute. Allergies  Allergen Reactions  . Codeine Nausea And Vomiting  . Erythromycin Swelling  . Penicillins Swelling  . Septra (Sulfamethoxazole W-Trimethoprim) Swelling     Current Outpatient Prescriptions  Medication Sig Dispense Refill  . acetaminophen (TYLENOL) 325 MG tablet Take 650 mg by mouth every 6 (six) hours as needed. For pain      . amLODipine (NORVASC) 2.5 MG tablet Take 2.5 mg by mouth daily.      Marland Kitchen aspirin EC 81 MG tablet Take 81 mg by mouth daily.      Marland Kitchen atorvastatin (LIPITOR) 20 MG tablet Take 20 mg by mouth daily.      . busPIRone (BUSPAR) 5 MG tablet Take 7.5 mg by mouth 2 (two) times daily.      . cholecalciferol (VITAMIN D) 1000 UNITS tablet Take 2,000 Units by mouth daily.      . cyclobenzaprine (FLEXERIL) 10 MG tablet Take 10 mg by mouth daily.      Marland Kitchen levothyroxine (SYNTHROID, LEVOTHROID) 112 MCG tablet Take 112 mcg by mouth daily.      Marland Kitchen loratadine (CLARITIN) 10 MG tablet Take 10 mg by mouth daily.      Marland Kitchen losartan-hydrochlorothiazide (HYZAAR) 100-12.5 MG per tablet Take 1 tablet by mouth daily.      . metoprolol succinate (TOPROL-XL) 50 MG 24 hr tablet Take 50 mg by mouth daily. Take with or immediately following a meal.      . Multiple Vitamin (MULITIVITAMIN WITH MINERALS) TABS Take 1 tablet by mouth daily.      . potassium chloride (K-DUR) 10 MEQ tablet Take 10 mEq by mouth daily.       No current  facility-administered medications for this visit.     Past Medical History  Diagnosis Date  . SVT (supraventricular tachycardia)     s/p ablation by Dr Ladona Ridgel 5/13  . LBBB (left bundle branch block)   . Hypertension   . Hypothyroid   . Hyperlipidemia     ROS:   All systems reviewed and negative except as noted in the HPI.   Past Surgical History  Procedure Laterality Date  . Eps and ablation for svt  5/13    slow pathway ablation by Dr Ladona Ridgel     History reviewed. No pertinent family history.   History   Social History  . Marital Status: Married    Spouse Name: N/A    Number of Children: N/A  . Years of Education: N/A   Occupational History  . Not on file.   Social History Main Topics  . Smoking status: Never Smoker   . Smokeless tobacco: Not on file  . Alcohol Use: No  . Drug Use: No  . Sexually Active: Not on file   Other Topics Concern  . Not on file   Social History Narrative   Lives in [place]     BP 158/80  Ht 5\' 6"  (1.676 m)  Physical Exam:  Well appearing 72 year old woman, NAD HEENT: Unremarkable Neck:  No JVD, no thyromegally Lungs:  Clear with no wheezes, rales, or rhonchi. HEART:  Regular rate rhythm, no murmurs, no rubs, no clicks Abd:  soft, positive bowel sounds, no organomegally, no rebound, no guarding Ext:  2 plus pulses, no edema, no cyanosis, no clubbing Skin:  No rashes no nodules Neuro:  CN II through XII intact, motor grossly intact  EKG Normal sinus rhythm with left bundle branch block  Assess/Plan:

## 2012-12-10 NOTE — Assessment & Plan Note (Signed)
She has had no recurrent supraventricular tachycardia. It appears that she is having sinus tachycardia. We discussed the mechanism of this. We discussed the possibility of additional diagnostic evaluation which I have not recommended versus watchful waiting and reinitiation of her regular daily exercise program. She states that she would like to start exercising and I've encouraged her to do so. Walking on flat ground has been recommended. I'll plan to see the patient back in several months.

## 2012-12-10 NOTE — Patient Instructions (Addendum)
Your physician wants you to follow-up in: 6 months with Dr Taylor You will receive a reminder letter in the mail two months in advance. If you don't receive a letter, please call our office to schedule the follow-up appointment.  

## 2012-12-18 ENCOUNTER — Telehealth: Payer: Self-pay | Admitting: *Deleted

## 2012-12-18 ENCOUNTER — Ambulatory Visit (INDEPENDENT_AMBULATORY_CARE_PROVIDER_SITE_OTHER): Payer: Medicare Other | Admitting: Nurse Practitioner

## 2012-12-18 ENCOUNTER — Encounter: Payer: Self-pay | Admitting: Nurse Practitioner

## 2012-12-18 VITALS — BP 166/76 | HR 120 | Temp 97.9°F | Ht 66.0 in | Wt 155.5 lb

## 2012-12-18 DIAGNOSIS — I471 Supraventricular tachycardia: Secondary | ICD-10-CM

## 2012-12-18 DIAGNOSIS — J40 Bronchitis, not specified as acute or chronic: Secondary | ICD-10-CM

## 2012-12-18 DIAGNOSIS — R002 Palpitations: Secondary | ICD-10-CM | POA: Insufficient documentation

## 2012-12-18 DIAGNOSIS — I4891 Unspecified atrial fibrillation: Secondary | ICD-10-CM

## 2012-12-18 DIAGNOSIS — I10 Essential (primary) hypertension: Secondary | ICD-10-CM

## 2012-12-18 DIAGNOSIS — I498 Other specified cardiac arrhythmias: Secondary | ICD-10-CM

## 2012-12-18 MED ORDER — METOPROLOL SUCCINATE ER 50 MG PO TB24: ORAL_TABLET | ORAL | Status: DC

## 2012-12-18 NOTE — Telephone Encounter (Signed)
Dr. Marguerite Olea called to say pt is in his office with possible new onset atrial fib with a rate of 100-150 BPM Pt is overall asymptomatic VSS Wants pt seen today or tomm with first available provider since this is a new issue Per Ward Givens, NP he will see pt today Dr. Marguerite Olea informed and will send pt over to [place] now

## 2012-12-18 NOTE — Progress Notes (Signed)
Patient Name: [name] Date of Encounter: 12/18/2012  Primary Care Provider:  Wonda Cheng, MD Primary Cardiologist:  Reece Agar. Ladona Ridgel, MD  Patient Profile  72 y/o female withh/o SVT s/p RFCA who presents secondary to palpitations.  Problem List   Past Medical History  Diagnosis Date  . SVT (supraventricular tachycardia)     a. s/p ablation by Dr Ladona Ridgel 5/13  . LBBB (left bundle branch block)   . Hypertension   . Hypothyroid   . Hyperlipidemia   . Bronchitis    Past Surgical History  Procedure Laterality Date  . Eps and ablation for svt  5/13    slow pathway ablation by Dr Ladona Ridgel    Allergies  Allergies  Allergen Reactions  . Codeine Nausea And Vomiting  . Erythromycin Swelling  . Penicillins Swelling  . Septra (Sulfamethoxazole W-Trimethoprim) Swelling    HPI  72 year old female with the above problem list. She was last seen in clinic on February 18 at which time she reported occasional tachycardia and palpitations. He was felt that this likely represented sinus tachycardia. Her rate and that day was in the 90s. Earlier this week, patient began to experience sore throat and this was followed by chest and upper respiratory congestion. She took Coricidin HBP yesterday and saw her primary care provider today. Over the course of the past couple days she has noted some increase in palpitations and heart rate. While at her primary care provider's office, an ECG was performed showing sinus tachycardia with PACs and PVCs. She has a left bundle branch block which is old. There was some concern that this might represent atrial fibrillation and she was referred to our clinic today. She does note that her rates have been elevated some but otherwise denies chest pain, shortness of breath, PND, orthopnea, dizziness, syncope, edema, or early satiety.  Home Medications  Prior to Admission medications   Medication Sig Start Date End Date Taking? Authorizing Provider  acetaminophen  (TYLENOL) 325 MG tablet Take 650 mg by mouth every 6 (six) hours as needed. For pain   Yes Historical Provider, MD  amLODipine (NORVASC) 2.5 MG tablet Take 2.5 mg by mouth daily.   Yes Historical Provider, MD  aspirin EC 81 MG tablet Take 81 mg by mouth daily.   Yes Historical Provider, MD  atorvastatin (LIPITOR) 20 MG tablet Take 20 mg by mouth daily.   Yes Historical Provider, MD  busPIRone (BUSPAR) 5 MG tablet Take 7.5 mg by mouth 2 (two) times daily.   Yes Historical Provider, MD  cholecalciferol (VITAMIN D) 1000 UNITS tablet Take 2,000 Units by mouth daily.   Yes Historical Provider, MD  cyclobenzaprine (FLEXERIL) 10 MG tablet Take 10 mg by mouth daily.   Yes Historical Provider, MD  doxycycline (DORYX) 100 MG EC tablet Take 100 mg by mouth 2 (two) times daily.   Yes Historical Provider, MD  guaiFENesin-codeine 100-10 MG/5ML syrup Take 5 mLs by mouth as needed for cough.   Yes Historical Provider, MD  levothyroxine (SYNTHROID, LEVOTHROID) 112 MCG tablet Take 112 mcg by mouth daily.   Yes Historical Provider, MD  loratadine (CLARITIN) 10 MG tablet Take 10 mg by mouth daily.   Yes Historical Provider, MD  losartan-hydrochlorothiazide (HYZAAR) 100-12.5 MG per tablet Take 1 tablet by mouth daily.   Yes Historical Provider, MD  metoprolol succinate (TOPROL-XL) 50 MG 24 hr tablet Take 1 1/2 tablets daily 12/18/12  Yes Ok Anis, NP  Multiple Vitamin (MULITIVITAMIN WITH MINERALS) TABS Take  1 tablet by mouth daily.   Yes Historical Provider, MD  potassium chloride (K-DUR) 10 MEQ tablet Take 10 mEq by mouth daily.   Yes Historical Provider, MD    Review of Systems  Palpitations as outlined above.  All other systems reviewed and are otherwise negative except as noted above.  Physical Exam  Blood pressure 166/76, pulse 120, temperature 97.9 F (36.6 C), height 5\' 6"  (1.676 m), weight 155 lb 8 oz (70.534 kg).  General: Pleasant, NAD Psych: Normal affect. Neuro: Alert and oriented X 3.  Moves all extremities spontaneously. HEENT: Normal  Neck: Supple without bruits or JVD. Lungs:  Resp regular and unlabored, CTA. Heart: Irregular, tachycardic, no s3, s4, or murmurs. Abdomen: Soft, non-tender, non-distended, BS + x 4.  Extremities: No clubbing, cyanosis or edema. DP/PT/Radials 2+ and equal bilaterally.  Accessory Clinical Findings  ECG - sinus tach, pac's, pvc's, 114, lbbb.  Assessment & Plan  1.  Palpitations/tachycardia: Patient has had increase in palpitations and tachycardia in the setting of bronchitis for which she was prescribed doxycycline earlier today. EKG in the office shows frequent PACs and PVCs. She had a thyroid profile and basic metabolic panel recently and these were apparently normal. I've asked her increase her metoprolol XL to 75 mg daily. I am hopeful that as she recovers from bronchitis her heart rate was stabilize.  2. Acute bronchitis: She was prescribed antibiotics this morning. She reports having had a low-grade fever this morning although is currently afebrile. This is likely driving her tachycardia.  3. Hypertension: Blood pressure is elevated today. Increasing beta blocker as above.  4. History of SVT: Status post ablation.  5. Hypothyroidism: Patient reports having had thyroid function testing within the past month and this was reportedly normal.  6. Disposition: Patient will increase her beta blocker dose and transfer heart rates at home and notify us if these do not improve. Otherwise, she will follow up with Dr. Ladona Ridgel as previously scheduled.  Nicolasa Ducking, NP 12/18/2012, 3:33 PM

## 2012-12-18 NOTE — Patient Instructions (Addendum)
Your physician has recommended you make the following change in your medication:  -increase metoprolol to 75 mg daily (1 1/2 tablets daily)  Keep appointment with Dr. Ladona Ridgel as scheduled

## 2012-12-18 NOTE — Telephone Encounter (Signed)
Kim from Dr Marguerite Olea office calling stating that pt is being seen in there office today for AFIB.pt was just seen by Dr Ladona Ridgel or 2/18. Wants to know what she should do.

## 2013-07-04 ENCOUNTER — Ambulatory Visit (INDEPENDENT_AMBULATORY_CARE_PROVIDER_SITE_OTHER): Payer: Medicare Other | Admitting: Internal Medicine

## 2013-07-04 ENCOUNTER — Encounter: Payer: Self-pay | Admitting: Internal Medicine

## 2013-07-04 VITALS — BP 184/81 | HR 84 | Ht 66.5 in | Wt 161.0 lb

## 2013-07-04 DIAGNOSIS — I471 Supraventricular tachycardia: Secondary | ICD-10-CM

## 2013-07-04 DIAGNOSIS — I10 Essential (primary) hypertension: Secondary | ICD-10-CM

## 2013-07-04 DIAGNOSIS — I498 Other specified cardiac arrhythmias: Secondary | ICD-10-CM

## 2013-07-04 DIAGNOSIS — R002 Palpitations: Secondary | ICD-10-CM

## 2013-07-04 NOTE — Assessment & Plan Note (Signed)
She has had no recurrent documented SVT. She will continue her current medical therapy.

## 2013-07-04 NOTE — Progress Notes (Signed)
HPI [name] returns today for followup. She is a very pleasant 72 year old woman with a remote history of SVT and left bundle branch block, who underwent catheter ablation several years ago. She developed palpitations which were ultimately found to be due to sinus tachycardia. These have resolved. She is been maintained on beta blockers, and her blood pressure has been fairly well-controlled. She states that at home she checks her pressure and the systolic is typically between 161 and 140 mm mercury. No peripheral edema. Allergies  Allergen Reactions  . Codeine Nausea And Vomiting  . Erythromycin Swelling  . Penicillins Swelling  . Septra [Sulfamethoxazole W-Trimethoprim] Swelling     Current Outpatient Prescriptions  Medication Sig Dispense Refill  . acetaminophen (TYLENOL) 325 MG tablet Take 650 mg by mouth every 6 (six) hours as needed. For pain      . amLODipine (NORVASC) 2.5 MG tablet Take 2.5 mg by mouth daily.      Marland Kitchen aspirin EC 81 MG tablet Take 81 mg by mouth daily.      Marland Kitchen atorvastatin (LIPITOR) 20 MG tablet Take 20 mg by mouth daily.      . busPIRone (BUSPAR) 5 MG tablet Take 7.5 mg by mouth 2 (two) times daily.      . cholecalciferol (VITAMIN D) 1000 UNITS tablet Take 2,000 Units by mouth daily.      . cyclobenzaprine (FLEXERIL) 10 MG tablet Take 10 mg by mouth daily.      Marland Kitchen levothyroxine (SYNTHROID, LEVOTHROID) 112 MCG tablet Take 112 mcg by mouth daily.      Marland Kitchen loratadine (CLARITIN) 10 MG tablet Take 10 mg by mouth daily.      Marland Kitchen losartan-hydrochlorothiazide (HYZAAR) 100-12.5 MG per tablet Take 1 tablet by mouth daily.      . metoprolol succinate (TOPROL-XL) 50 MG 24 hr tablet Take 1 1/2 tablets daily  45 tablet  6  . Multiple Vitamin (MULITIVITAMIN WITH MINERALS) TABS Take 1 tablet by mouth daily.      . potassium chloride (K-DUR) 10 MEQ tablet Take 10 mEq by mouth daily.       No current facility-administered medications for this visit.     Past Medical History   Diagnosis Date  . SVT (supraventricular tachycardia)     a. s/p ablation by Dr Ladona Ridgel 5/13  . LBBB (left bundle branch block)   . Hypertension   . Hypothyroid   . Hyperlipidemia   . Bronchitis     ROS:   All systems reviewed and negative except as noted in the HPI.   Past Surgical History  Procedure Laterality Date  . Eps and ablation for svt  5/13    slow pathway ablation by Dr Ladona Ridgel     History reviewed. No pertinent family history.   History   Social History  . Marital Status: Married    Spouse Name: N/A    Number of Children: N/A  . Years of Education: N/A   Occupational History  . Not on file.   Social History Main Topics  . Smoking status: Never Smoker   . Smokeless tobacco: Not on file  . Alcohol Use: No  . Drug Use: No  . Sexual Activity: Not on file   Other Topics Concern  . Not on file   Social History Narrative   Lives in [place]     BP 184/81  Pulse 84  Ht 5' 6.5" (1.689 m)  Wt 161 lb (73.029 kg)  BMI 25.6 kg/m2 Blood pressure 160/80  on my exam Physical Exam:  Well appearing 72 year old woman, NAD HEENT: Unremarkable Neck:  6 cm JVD, no thyromegally Back:  No CVA tenderness Lungs:  Clear with no wheezes, rales, or rhonchi. HEART:  Regular rate rhythm, no murmurs, no rubs, no clicks Abd:  soft, positive bowel sounds, no organomegally, no rebound, no guarding Ext:  2 plus pulses, no edema, no cyanosis, no clubbing Skin:  No rashes no nodules Neuro:  CN II through XII intact, motor grossly intact  EKG - normal sinus rhythm with left bundle branch block  Assess/Plan:

## 2013-07-04 NOTE — Patient Instructions (Signed)
Your physician recommends that you continue on your current medications as directed. Please refer to the Current Medication list given to you today.  Your physician wants you to follow-up in: 1 year with Dr. Taylor.  You will receive a reminder letter in the mail two months in advance. If you don't receive a letter, please call our office to schedule the follow-up appointment.  

## 2013-07-04 NOTE — Assessment & Plan Note (Signed)
On recheck, her blood pressure remains high though improved. She states that she has white coat hypertension. Her blood pressures at home are 130. I've asked the patient to keep a lot of her blood pressure and bring it when I see her back in a year. She admits to dietary indiscretion.

## 2014-10-01 ENCOUNTER — Encounter (HOSPITAL_COMMUNITY): Payer: Self-pay | Admitting: Internal Medicine

## 2015-01-19 DIAGNOSIS — I10 Essential (primary) hypertension: Secondary | ICD-10-CM | POA: Diagnosis not present

## 2015-01-19 DIAGNOSIS — E785 Hyperlipidemia, unspecified: Secondary | ICD-10-CM | POA: Diagnosis not present

## 2015-01-19 DIAGNOSIS — E039 Hypothyroidism, unspecified: Secondary | ICD-10-CM | POA: Diagnosis not present

## 2015-01-19 DIAGNOSIS — M542 Cervicalgia: Secondary | ICD-10-CM | POA: Diagnosis not present

## 2015-01-19 DIAGNOSIS — F419 Anxiety disorder, unspecified: Secondary | ICD-10-CM | POA: Diagnosis not present

## 2015-02-14 NOTE — H&P (Signed)
PATIENT NAME:  [name], [name] MR#:  [id number] DATE OF BIRTH:  07-28-1941  DATE OF ADMISSION:  02/23/2012  ADMITTING PHYSICIAN: Dr. Gladstone Lighter.   PRIMARY CARE PHYSICIAN: Dr. Debbora Dus. Since he will be retiring, the patient will be seeing Dr. Ginette Pitman from July 2013.   PRIMARY CARDIOLOGIST: Dr. Ubaldo Glassing.   CHIEF COMPLAINT: Chest pain and palpitations.   HISTORY OF PRESENT ILLNESS: [name] is a 74 year old pleasant Caucasian female with past medical history significant for hypertension, hypothyroidism, hyperlipidemia, and chronic left bundle branch block, brought in from home by EMS secondary to chest pain that started this morning. The patient said that she was fine last night when she went to bed, could not sleep well last night, woke up this morning, did not feel well. She was nauseous. She took her medications, fed her cats and then all of a sudden she had chest pain, which was sharp and heavy, 8/10 in intensity radiating to her jaw. Also, had palpitations, broke into sweat and also nearly passed out. EMS was called and her EKG showed heart rate of about 300 and the rhythm strip is ventricular tach versus atrial fibrillation with RVR. Difficult to interpret with her underlying left bundle branch block. She did receive amiodarone 300 milligrams and rate controlled and her rhythm is more bundle branch with atrial fibrillation now at this time. Her chest pain is 2/10 in intensity currently and she feels a whole lot better. She has been seeing Dr. Ubaldo Glassing for her left bundle branch block. Her echocardiogram and last stress test was in 06/2011 which was exercise stress test. EKG changes were limited secondary to underlying left bundle branch block rhythm, but the ejection fraction was 60% and no ischemia was identified on the stress test.   PAST MEDICAL HISTORY:  1. Hypertension.  2. Hypothyroidism.  3. Hyperlipidemia.  4. Chronic neck pain probably musculoskeletal versus degenerative disk disease.   5. Mild mitral and aortic insufficiency.  6. Osteopenia.  7. Anxiety.  8. Hypokalemia.   PAST SURGICAL HISTORY:  1. Polypectomy.  2. Detached left eye retina surgery.  3. Cataract surgery in her left eye.   ALLERGIES TO MEDICATIONS: Allergy to penicillin.   HOME MEDICATIONS:  1. Losartan/HCTZ 100/12 .5 mg 1 tablet p.o. daily.  2. Levothyroxine 112 micrograms p.o. daily.  3. Metoprolol ER 50 mg p.o. daily.  4. Buspirone 7.5 mg p.o. b.i.d.  5. Flexeril 10 mg p.o. at bedtime.  6. Atorvastatin 20 mg p.o. daily.  7. Calcium with vitamin D supplements.  8. Aspirin 2 tablets p.o. daily.  9. Klor-Con 10 milliequivalents p.o. daily.   SOCIAL HISTORY: Lives at home by herself, very functional. Denies use of any tobacco, alcohol, or drugs.   FAMILY HISTORY: Father died from breast cancer. He also had heart disease in his 12s and stroke. Mom died with Alzheimer's disease.   REVIEW OF SYSTEMS: CONSTITUTIONAL: No fever, fatigue, or weakness. 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 Left eye status post cataract surgery and detached retinal surgery. ENT: No tinnitus, ear pain, hearing loss, epistaxis, or discharge. RESPIRATORY: Dyspnea on exertion which is chronic. No cough, wheeze, hemoptysis, or chronic obstructive pulmonary disease. CARDIOVASCULAR: Positive for chest pain, palpitations, and arrhythmia and near syncope. GASTROINTESTINAL: No nausea, vomiting, diarrhea, abdominal pain, hematemesis, or melena. GENITOURINARY: No dysuria, hematuria, renal calculus, frequency, or incontinence. ENDOCRINE: No polyuria, nocturia, thyroid problems, heat or cold intolerance. HEMATOLOGY: No anemia, easy bruising or bleeding. SKIN: No acne, rash, or  lesions. MUSCULOSKELETAL: Positive for chronic neck pain. No gout or arthritis. NEUROLOGIC: No numbness, weakness, cerebrovascular accident, transient ischemic attack, or seizures. PSYCHOLOGIC: Positive for anxiety. No insomnia or depression.    PHYSICAL EXAMINATION:  VITAL SIGNS: Temperature afebrile, pulse 113 after converted to atrial fibrillation, after giving amiodarone, respirations 20, blood pressure 148/74, pulse ox 98% on room air.   GENERAL: Well built, well nourished female lying in bed, not in any acute distress.   HEENT: Normocephalic, atraumatic. Pupils equal, round, reacting to light. Anicteric sclerae. Extraocular movements intact. Oropharynx clear without erythema, mass or exudates.   NECK: Supple. No thyromegaly, JVD, or carotid bruits. No lymphadenopathy.   LUNGS: Moving air bilaterally. No wheeze or crackles. No use of accessory muscles for breathing.   CARDIOVASCULAR: S1, S2. II/VI systolic murmur in the precordial region. No rubs or gallops.   ABDOMEN: Soft, nontender, nondistended. No splenomegaly.   EXTREMITIES: No pedal edema. No clubbing or cyanosis. 2+ dorsalis pedis pulses palpable bilaterally.   SKIN: No acne, rash, or lesions.   LYMPHATICS: No cervical lymphadenopathy.   NEUROLOGIC: Cranial nerves intact. No focal motor or sensory deficits.   PSYCHOLOGICAL: The patient is awake, alert, oriented x3.   LABORATORY DATA: Sodium 142, potassium 4.1, chloride 111, bicarbonate 23, BUN 20, creatinine 0.72, glucose 136, calcium 8.5. ALT 21, AST 27, alkaline phosphatase 57, total bilirubin 0.7, albumin 4.0. WBC 7.3, hemoglobin 14.9, hematocrit 43.9, platelet count 200, troponin less than 0.02. Magnesium 1.9. INR 0.9. CK 66, CK-MB 0.7. Chest x-ray on admission showing mild changes consistent with mild pulmonary vascular congestion. No frank edema or pleural effusion seen, minimal atelectasis of the left lung base. No pneumonia is seen. EKG now shows atrial fibrillation with underlying bundle branch block, heart rate of 110.   ASSESSMENT AND PLAN: 74 year old female with a history of hypertension, underlying left bundle branch block, hypothyroidism, who comes in for chest pain and initial rhythm strip showing  V. tach versus atrial fibrillation with RVR converted to atrial fibrillation rate controlled now.  1. V. tach versus atrial fibrillation with RVR. Difficult to intubate with underlying left bundle branch block, now after amiodarone rate better controlled and she is in atrial fibrillation. Admit to telemetry. Cycle cardiac enzymes. Echocardiogram ordered and cardiology consultation with Dr. Ubaldo Glassing. Dr. Saralyn Pilar has not been consulted and I spoke with him. Continue Toprol for now and currently on heparin drip. Her last exercise stress test was in 06/2011 at Dr. Bethanne Ginger office which was normal.  2. Hypertension. Continue metoprolol, losartan and HCTZ.  3. Hyperlipidemia. Continue Lipitor.  4. Chronic neck pain, degenerative disk disease. On Flexeril at bedtime.  5. Anxiety. Continue buspirone.  6. CODE STATUS: FULL CODE.   TIME SPENT ON ADMISSION:  50 minutes.   ____________________________ Gladstone Lighter, MD rk:ap D: 02/23/2012 10:59:27 ET T: 02/23/2012 11:28:32 ET JOB#: [id number]  cc: Gladstone Lighter, MD, <Dictator> Milinda Pointer. Jacqualine Code, MD Javier Docker. Ubaldo Glassing, MD Gladstone Lighter MD ELECTRONICALLY SIGNED 02/27/2012 14:25

## 2015-02-14 NOTE — Consult Note (Signed)
PATIENT NAME:  [name], [name] MR#:  [id number] DATE OF BIRTH:  10-16-1941  DATE OF CONSULTATION:  02/24/2012  REFERRING PHYSICIAN:  Gladstone Lighter, MD   CONSULTING PHYSICIAN:  Isaias Cowman, MD  PRIMARY CARE PHYSICIAN: Tracie Harrier, MD   CHIEF COMPLAINT: Palpitations.   HISTORY OF PRESENT ILLNESS: The patient is a 74 year old female referred for evaluation of wide complex tachycardia. The patient has had a recent history of left bundle branch block previously seen by Dr. Ubaldo Glassing. Echocardiogram previously reported was normal. The patient was in her usual state of health until the morning of 02/23/2012 when she took her morning medications and started to experience substernal chest discomfort with palpitations. The patient felt somewhat presyncopal. EMS was called and arrived, and the patient was in rapid wide complex tachycardia. The patient was brought to the [facility] Emergency Room where she was treated with 300 mg of IV amiodarone. Her heart rate slowed down to atrial fibrillation with an underlying left bundle branch block. The patient was admitted to telemetry where she has been in sinus rhythm patient. The patient has borderline elevated troponin of 0.10. Other notable labs included an elevated thyroid stimulating hormone of 6.92.   PAST MEDICAL HISTORY:  1. History of tachycardia.  2. Hypertension.  3. Hyperlipidemia.  4. Hypothyroidism.  5. Mild mitral and aortic insufficiency.  6. Anxiety.   MEDICATIONS ON ADMISSION:  1. Metoprolol ER 50 mg daily.  2. Losartan/HCTZ 100/12.5 mg daily.  3. Aspirin 2 tablets daily.  4. Atorvastatin 20 mg daily.  5. Klor-Con 10 mEq daily.  6. Levothyroxine 112 mcg daily.  7. Buspirone 7.5 mg b.i.d.  8. Flexeril 10 mg at bedtime.  9. Calcium with D 1 daily.   SOCIAL HISTORY: The patient currently lives alone. She denies tobacco or EtOH abuse.   FAMILY HISTORY: Father had a history of coronary artery disease.     REVIEW OF SYSTEMS: CONSTITUTIONAL: No fever or chills. EYES: No blurry vision. EARS: No hearing loss. RESPIRATORY: The patient does have chronic exertional dyspnea. CARDIOVASCULAR: The patient had chest pain and palpitations as described above. GASTROINTESTINAL: No nausea, vomiting, or diarrhea. GU: No dysuria or hematuria. ENDOCRINE: No polyuria or polydipsia. HEMATOLOGICAL: No bleeding or easy bruising. MUSCULOSKELETAL: The patient has some chronic neck pain. NEUROLOGICAL: The patient denies focal muscle weakness or numbness. PSYCHOLOGICAL: The patient does have anxiety.   PHYSICAL EXAMINATION:  VITAL SIGNS: Blood pressure 150/61, pulse 86, respirations 18, temperature 98.2, pulse oximetry 98%.   HEENT: Pupils are equal, reactive to light and accommodation.   NECK: Supple without thyromegaly.   LUNGS: Clear.   CARDIOVASCULAR: Normal jugular venous pressure. Normal point of maximal impulse. Regular rate and rhythm. Normal S1, S2. No appreciable gallop, murmur, or rub.   ABDOMEN: Soft and nontender. Pulses were intact bilaterally.   MUSCULOSKELETAL: Normal muscle tone.   NEUROLOGICAL: The patient is alert and oriented x3. Motor and sensory are both grossly intact.   IMPRESSION: A 74 year old female who presents with wide complex tachycardia with very rapid rate, with underlying left bundle branch block without change in QRS axis. The patient has known normal left ventricular function. This likely represents a supraventricular source, possibly atrial flutter, with near 1:1 conduction or atrial fibrillation with a rapid ventricular response. Nonetheless, the patient converted to atrial fibrillation with amiodarone with controlled rate and has remained clinically hemodynamically stable. The patient has borderline elevated troponin likely due to demand/supply ischemia.   RECOMMENDATIONS:  1. Increase metoprolol to 50  mg b.i.d.  2. Review 2-D echocardiogram.  3. Work-up elevated TSH, though  this may be stress related.  4. Consider a functional study, which may be done as an outpatient.  5. Consider EP study in light of the patient's very rapid rate and may consider possible catheter ablation.   ____________________________ Isaias Cowman, MD ap:cbb D: 02/24/2012 09:22:11 ET T: 02/24/2012 09:50:32 ET JOB#: [id number]  cc: Isaias Cowman, MD, <Dictator> Isaias Cowman MD ELECTRONICALLY SIGNED 02/25/2012 9:39

## 2015-02-14 NOTE — Consult Note (Signed)
Brief Consult Note: Diagnosis: WCT, very rapid, probable AF or AFL with rapid condustion though cannot completely rule-out VT.   Patient was seen by consultant.   Consult note dictated.   Comments: REC  Increase metoprolol succinate to 50 mg BID, review echo, strongly consider EP eval, possible EPS and catheter ablation. Defer further amio or chronic anticoagulation, CHADS 2 score of 1.  Electronic Signatures: Isaias Cowman (MD)  (Signed [phone] 09:37)  Authored: Brief Consult Note   Last Updated: 04-May-13 09:37 by Isaias Cowman (MD)

## 2015-02-14 NOTE — Consult Note (Signed)
General Aspect EP Consultation  Requesting MD:  Dr Saralyn Pilar    Present Illness [name] is a pleasant 74 year old female with a history of LBBB and prior tachycardia who was admitted with symptomatic wide complex tachycardia.  [name] states that [name] woke yesterday morning with symptomatic dizziness, lightheadedness, diaphoresis, and chest discomfort.  [name] did not have syncope but felt presyncopal.  EMS was called and upon their arrival, [name] was found to have a left bundle branch block wide QRS tachycardia at 220 bpm.  The patient was brought to the [facility] Emergency Room where [name] was treated with 300 mg of IV amiodarone.  [name] converted to atrial fibrillation.  [name] subsequently converted to sinus rhythm with resolution of [name] symptoms.  [name] has been observed overnight to have sinus rhythm with very frequent atrial ectopy.  [name] reports feeling "tired" today and attributes this to sleeping poorly overnight.  Presently, [name] denies CP, SOB, palpitations, dizziness, N/V, presyncope, or other concerns.  [name] has had a recent history of left bundle branch block previously seen by Dr. Ubaldo Glassing. Echocardiogram previously reported was normal.  Echo this admit per Dr Saralyn Pilar reveals EF 45-50%.  PAST MEDICAL HISTORY:  1. History of tachycardia (4 years ago).  2. Hypertension.  3. Hyperlipidemia.  4. Hypothyroidism.  5. Mild mitral and aortic insufficiency.  6. Anxiety.   Allergies: reviewed  FH:  + alzheimers disease  SH:  Lives in [place] alone.  Retired Merchandiser, retail.  Denies tobacco, ETOH, drug use  ROS:  all systems reviewed and normal except as per HPI above, in addition, [name] has decreased vision chronically  EKG 02/23/12- afib, 110 bpm, LBBB Rhythm strip 02/23/12- left bundle branch morphology (similar to sinus) tachycardia at 220 bpm   Physical Exam:   GEN well developed, well nourished, no acute distress    HEENT PERRL, Oropharynx clear    NECK supple  No masses     RESP normal resp effort  clear BS    CARD Regular rate and rhythm  Normal, S1, S2  No murmur    ABD denies tenderness  denies Flank Tenderness  soft  normal BS    LYMPH negative neck    EXTR negative cyanosis/clubbing, negative edema    SKIN normal to palpation    NEURO motor/sensory function intact    PSYCH alert, A+O to time, place, person   Home Medications: Medication Instructions Status  aspirin 81 mg oral enteric coated tablet 2 tab(s) orally once a day Active  BuSpar 5 mg oral tablet 1.5 tab(s) orally 2 times a day  Active  Caltrate 600 mg oral tablet 3 tab(s) orally 3 times a day (total 1820m) Active  Levoxyl 112 mcg (0.112 mg) oral tablet 1 tab(s) orally once a day Active  Toprol-XL 50 mg oral tablet, extended release 1 tab(s) orally once a day Active  Klor-Con M10 oral tablet, extended release 1 tab(s) orally once a day Active  hydrochlorothiazide-losartan 12.5 mg-100 mg oral tablet 1 tab(s) orally once a day Active  Lipitor 20 mg oral tablet 1 tab(s) orally once a day Active  Flexeril 10 mg oral tablet 1 tab(s) orally once a day Active   Cardiac:  03-May-13 08:24    CK, Total 66   CPK-MB, Serum 0.7  Routine Chem:  03-May-13 08:24    Glucose, Serum 136   BUN 20   Creatinine (comp) 0.72   Sodium, Serum 142   Potassium, Serum 4.1   Chloride, Serum 111  CO2, Serum 23   Calcium (Total), Serum 8.5  Hepatic:  03-May-13 08:24    Bilirubin, Total 0.5   Alkaline Phosphatase 57   SGPT (ALT) 21   SGOT (AST) 27   Total Protein, Serum 7.3   Albumin, Serum 4.0  Routine Chem:  03-May-13 08:24    Osmolality (calc) 288   eGFR (African American) >60   eGFR (Non-African American) >60   Anion Gap 8  Routine Hem:  03-May-13 08:24    WBC (CBC) 7.3   RBC (CBC) 4.60   Hemoglobin (CBC) 14.9   Hematocrit (CBC) 43.1   Platelet Count (CBC) 200   MCV 94   MCH 32.4   MCHC 34.5   RDW 13.5  Cardiac:  03-May-13 08:24    Troponin I < 0.02  Routine Coag:   03-May-13 08:24    Activated PTT (APTT) 28.6  Routine Chem:  03-May-13 08:24    Magnesium, Serum 1.9  Routine Coag:  03-May-13 08:24    Prothrombin 12.8   INR 0.9  Thyroid:  03-May-13 08:24    Thyroid Stimulating Hormone 6.92  Cardiac:  03-May-13 16:42    CK, Total 65   CPK-MB, Serum 1.2   Troponin I 0.10  Routine Coag:  03-May-13 16:42    Activated PTT (APTT) 70.4  Cardiac:  04-May-13 01:12    CK, Total 47   CPK-MB, Serum 1.2  Routine Chem:  04-May-13 01:12    Glucose, Serum 107   BUN 18   Creatinine (comp) 0.68   Sodium, Serum 143   Potassium, Serum 3.4   Chloride, Serum 109   CO2, Serum 27   Calcium (Total), Serum 8.8   Osmolality (calc) 287   eGFR (African American) >60   eGFR (Non-African American) >60   Anion Gap 7  Routine Hem:  04-May-13 01:12    WBC (CBC) 10.4   RBC (CBC) 4.09   Hemoglobin (CBC) 13.2   Hematocrit (CBC) 38.0   Platelet Count (CBC) 187   MCV 93   MCH 32.3   MCHC 34.8   RDW 13.5  Cardiac:  04-May-13 01:12    Troponin I 0.08  Routine Coag:  04-May-13 01:12    Activated PTT (APTT) 65.9  Routine Chem:  04-May-13 01:12    Magnesium, Serum 1.9  Routine Hem:  04-May-13 01:12    Neutrophil % 65.8   Lymphocyte % 26.3   Monocyte % 6.3   Eosinophil % 1.2   Basophil % 0.4   Neutrophil # 6.8   Lymphocyte # 2.7   Monocyte # 0.7   Eosinophil # 0.1   Basophil # 0.0  Thyroid:  04-May-13 01:12    Thyroxine, Free 1.42  Routine Coag:  04-May-13 11:02    Activated PTT (APTT) 61.2  Routine Chem:  04-May-13 11:02    Magnesium, Serum 1.8    PCN: Swelling  Amoxicillin: Swelling  E-Mycin: Swelling  Codeine: N/V/Diarrhea  Septra: Swelling  Vital Signs/Nurse's Notes: **Vital Signs.:   04-May-13 12:34   Vital Signs Type Routine   Temperature Temperature (F) 98.1   Celsius 36.7   Temperature Source oral   Pulse Pulse 93   Pulse source per Dinamap   Respirations Respirations 20   Systolic BP Systolic BP 970   Diastolic BP  (mmHg) Diastolic BP (mmHg) 87   Mean BP 101   BP Source Dinamap   Pulse Ox % Pulse Ox % 98   Pulse Ox Activity Level  At rest   Oxygen Delivery Room  Air/ 21 %     Impression The patient presents with a left bundle branch block morphology wide complex tachycardia.  This is a very reqular tachycardia and likely represents either bundle branch reentry, 1:1 atrial flutter, or SVT.  [name] converted to afib with amiodarone and subsequently sinus rhythm.  [name] does not have a known history of afib.  Given CHADS2 score of 1, I would not favor anticoagulation unless afib presents itselft in the future.  [name] EF is 45-50%.  Given chronic dyspnea and LBBB, Dr Saralyn Pilar is planning an outpatient myoview in the next few weeks which I think is appropriate.    Plan Therapeutic options for wide complex tachycardia were discussed at length with the patient today including both medicine and catheter based therapy.  Risks, benefits, and alternatives to EP study and radiofrequency ablation were also discussed at length.  [name] understands that the risks include but are not limited to bleeding, vascular damage, tamponade, AV block requiring PPM, renal failure, stroke, MI, and death.  [name] accepts these risks and wishes to proceed. We will  therefore plan EP study in the next week as EP lab at [facility] availability will allow. In the interim, [name] will be discharged to home on twice daily toprol 69m.  [name] is instructed to not drive until after [name] EP study and to return should any of [name] symptoms return.   Electronic Signatures: ACoralyn Mark(MD)  (Signed [id number])  Authored: General Aspect/Present Illness, History and Physical Exam, Home Medications, Labs, Allergies, Vital Signs/Nurse's Notes, Impression/Plan   Last Updated: 04-May-13 17:15 by ACoralyn Mark(MD)

## 2015-02-14 NOTE — Discharge Summary (Signed)
PATIENT NAME:  [name], [name] MR#:  [id number] DATE OF BIRTH:  May 01, 1941  DATE OF ADMISSION:  02/23/2012 DATE OF DISCHARGE:  02/24/2012  ADDENDUM:   The patient was seen by cardiologist, Dr. Thompson Grayer, from [facility]. The patient presented with a left bundle branch block morphology wide complex tachycardia. He felt that it was a very regular tachycardia and likely represented either a bundle branch re-entry, 1:1 atrial flutter, or SVT. She converted to atrial fibrillation with amiodarone and subsequently to sinus rhythm. According to Dr. Rayann Heman, she doesn't have any known history of atrial fibrillation. Given the patient's CHADS-II score of 1, he would not favor anticoagulation unless atrial fibrillation presented itself in the future. Her ejection fraction was noted to be 45 to 50% on recent echocardiogram. Given chronic dyspnea as well as left bundle branch block, Dr. Saralyn Pilar was planning an outpatient Myoview in the next few weeks and Dr. Rayann Heman felt that it was appropriate. According to him, therapeutic options for wide complex tachycardia were discussed at length with the patient on day of discharge, 02/24/2012, including both medicine as well as catheter based therapy. Risks, benefits, as well as alternatives to EP study as well as radiofrequency ablation were also discussed at length. The patient understood the risks including, but not limited to, bleeding, vascular damage, tamponade, AV block requiring PPM, renal failure, stroke, MI, or death. She accepted those risks and wishes to proceed. That is why Dr. Rayann Heman is going to plan EP study next week at EP Lab at [facility] depending on availability. In the interim he recommended to discharge the patient home on Toprol-XL 50 mg twice daily dose. She was also instructed not to drive until her EP study is done and recommendations completed. For this reason, the patient is being discharged home with previously mentioned medications  as well as follow-up.   The patient's echocardiogram results are as follows: Mildly reduced left ventricular function, estimated ejection fraction of 45 to 50%, LVH, moderate MR as well as TR, small pericardial effusion of uncertain clinical significance, mild left atrial enlargement, moderate mitral regurgitation as mentioned above. Left atrium mildly dilated. Mild aortic regurgitation. Small pericardial effusion.   OTHER STUDIES: Magnesium level was found to be normal at 1.8. The patient's free thyroxine as mentioned above 1.42 which was within normal limits. T3 level is still pending.  DISCHARGE VITALS: Temperature 98.5, pulse ranging from 86 to 100, respiration rate 18 to 20, blood pressure 144/80, saturation 99% on room air at rest.   TIME SPENT: Additional 15 minutes.   ____________________________ Theodoro Grist, MD rv:drc D: 02/24/2012 17:21:08 ET T: 02/25/2012 11:05:13 ET JOB#: [id number]  cc: Theodoro Grist, MD, <Dictator> Milinda Pointer. Jacqualine Code, MD Javier Docker. Ubaldo Glassing, MD Theodoro Grist MD ELECTRONICALLY SIGNED 03/02/2012 14:18

## 2015-02-14 NOTE — Discharge Summary (Signed)
PATIENT NAME:  [name], HAGGAR MR#:  [id number] DATE OF BIRTH:  1941/03/26  DATE OF ADMISSION:  02/23/2012 DATE OF DISCHARGE:  02/24/2012  ADMITTING DIAGNOSIS: Atrial fibrillation with rapid ventricular response.   DISCHARGE DIAGNOSES:  1. Arrhythmia, wide complex tachycardia not otherwise specified, questionable ventricular tachycardia, atrial fibrillation, rapid ventricular response after slowing it down and now in sinus rhythm lead.  2. History of left bundle branch block.  3. Chest pain with arrhythmia.  4. Elevated troponin, no acute coronary syndrome, per Cardiology, likely demand ischemia.  5. History of hypertension. 6. Hyperlipidemia. 7. Hypothyroidism. 8. Anxiety. 9. Hypokalemia.   DISCHARGE CONDITION: Stable.   DISCHARGE MEDICATIONS: The patient is to resume her outpatient medications which are: 1. Aspirin 81 mg, 2 tablets once daily. 2. BuSpar 5 mg, 1-1/2 tablets twice daily.  3. Caltrate 600 mg, 3 tablets t.i.d.  4. Levoxyl 112 mcg p.o. daily.  5. Klor-Con 10 mEq p.o. daily.  6. Hydrochlorothiazide/losartan 12.5/100 mg once daily.  7. Lipitor 20 mg p.o. daily.  8. Flexeril 10 mg p.o. daily.   ADDITIONAL MEDICATIONS: Toprol-XL 50 mg p.o. b.i.d. This is a new dose, up from once daily, to be started today on 02/24/2012.   HOME OXYGEN: None.   DIET: 2 grams salt, low fat, low cholesterol.   ACTIVITY LIMITATIONS: As tolerated.   FOLLOWUP:  1. Follow-up appointment with Dr. Ubaldo Glassing in two days after discharge.  2. Follow-up appointment with Dr. Jacqualine Code in two days after discharge.    CONSULTANT: Dr. Saralyn Pilar.   LABORATORY, DIAGNOSTIC AND RADIOLOGICAL DATA:  Chest x-ray, portable single view on 02/23/2012, showed no pneumonia identified. There are observed mild changes consistent with pulmonary vascular congestion, but no frank pulmonary edema or pleural effusion is seen. Minimal atelectasis of the left lung base was noted. Echocardiogram is pending.  The patient's  initial labs were remarkable for an elevated BUN to 20, glucose 136, otherwise BMP was unremarkable. Magnesium level was normal at 1.9.  Liver enzymes were normal.  Cardiac enzymes, first set, was normal; however, troponin on the second set was elevated to 0.10 and on the third set 0.08.  MB fraction as well as CK total were within normal limits.  TSH was slightly elevated at 6.92. However, the patient's free T4 was normal at 1.42.  CBC was within normal limits.  Coagulation panel was unremarkable.   HISTORY AND PHYSICAL: The patient is a 74 year old Caucasian female who presented to the hospital with complaints of chest pains as well as palpitations. Please refer to Dr. Wyatt Portela admission note on 02/23/2012. On arrival to the hospital, the patient was syncopal, broke into a sweat, had palpitations. EMS was called, and EKG showed heart rate of around 250s to 300s, and strip was concerning for ventricular tachycardia versus ventricular fibrillation/RVR, which was very difficult to interpret due to underlying left bundle branch block. The patient received 300 mg of amiodarone which slowed her rhythm down to 110 and showed atrial fibrillation/RVR.   HOSPITAL COURSE: The patient was admitted to the hospital and started on heparin IV. She also was continued on Toprol-XL at 50 mg p.o. daily dose. Consultation with Dr. Saralyn Pilar was obtained. Dr. Saralyn Pilar felt that the patient did have wide complex tachycardia, very rapid around 250, probable atrial fibrillation or some other tachycardia, though cannot completely rule out ventricular tachycardia. Dr. Saralyn Pilar recommended to increase metoprolol succinate to 50 mg twice daily dose, get echocardiogram, also strongly consider EP evaluation, possible EPS as well as cardiac ablation. He  recommended to defer amiodarone or chronic anticoagulation as the patient's CHADS score was one. The patient converted into sinus rhythm during her stay in the hospital. Her vital  signs were stable with temperature 98.2, pulse 86, respiration rate 18, blood pressure 150/61, saturation 98% and 99% on room air at rest.   DISPOSITION: The patient will be transferred to [facility] for electrophysiologic evaluation. Echocardiogram results, as mentioned above, are still pending.   TIME SPENT: 40 minutes.  ____________________________ Theodoro Grist, MD rv:cbb D: 02/24/2012 10:10:14 ET T: 02/24/2012 10:50:23 ET JOB#: [id number]  cc: Theodoro Grist, MD, <Dictator> Javier Docker. Ubaldo Glassing, MD Milinda Pointer Jacqualine Code, MD Theodoro Grist MD ELECTRONICALLY SIGNED 03/02/2012 14:18

## 2015-04-21 ENCOUNTER — Ambulatory Visit (INDEPENDENT_AMBULATORY_CARE_PROVIDER_SITE_OTHER): Payer: Medicare Other | Admitting: Family Medicine

## 2015-04-21 ENCOUNTER — Encounter: Payer: Self-pay | Admitting: Family Medicine

## 2015-04-21 VITALS — BP 146/78 | HR 76 | Temp 97.7°F | Ht 67.0 in | Wt 153.4 lb

## 2015-04-21 DIAGNOSIS — E876 Hypokalemia: Secondary | ICD-10-CM

## 2015-04-21 DIAGNOSIS — I10 Essential (primary) hypertension: Secondary | ICD-10-CM

## 2015-04-21 DIAGNOSIS — F419 Anxiety disorder, unspecified: Secondary | ICD-10-CM | POA: Insufficient documentation

## 2015-04-21 DIAGNOSIS — T502X5A Adverse effect of carbonic-anhydrase inhibitors, benzothiadiazides and other diuretics, initial encounter: Secondary | ICD-10-CM | POA: Diagnosis not present

## 2015-04-21 DIAGNOSIS — M47812 Spondylosis without myelopathy or radiculopathy, cervical region: Secondary | ICD-10-CM | POA: Diagnosis not present

## 2015-04-21 DIAGNOSIS — E038 Other specified hypothyroidism: Secondary | ICD-10-CM

## 2015-04-21 DIAGNOSIS — E039 Hypothyroidism, unspecified: Secondary | ICD-10-CM | POA: Insufficient documentation

## 2015-04-21 DIAGNOSIS — E785 Hyperlipidemia, unspecified: Secondary | ICD-10-CM | POA: Diagnosis not present

## 2015-04-21 DIAGNOSIS — I471 Supraventricular tachycardia: Secondary | ICD-10-CM | POA: Diagnosis not present

## 2015-04-21 MED ORDER — AMLODIPINE BESYLATE 2.5 MG PO TABS: 2.5000 mg | ORAL_TABLET | Freq: Every day | ORAL | Status: DC

## 2015-04-21 MED ORDER — BUSPIRONE HCL 5 MG PO TABS: 7.5000 mg | ORAL_TABLET | Freq: Two times a day (BID) | ORAL | Status: DC

## 2015-04-21 MED ORDER — CYCLOBENZAPRINE HCL 10 MG PO TABS: 10.0000 mg | ORAL_TABLET | Freq: Every day | ORAL | Status: DC

## 2015-04-21 MED ORDER — LEVOTHYROXINE SODIUM 112 MCG PO TABS: 112.0000 ug | ORAL_TABLET | Freq: Every day | ORAL | Status: DC

## 2015-04-21 MED ORDER — METOPROLOL SUCCINATE ER 50 MG PO TB24: 50.0000 mg | ORAL_TABLET | Freq: Every day | ORAL | Status: DC

## 2015-04-21 MED ORDER — POTASSIUM CHLORIDE ER 10 MEQ PO TBCR: 10.0000 meq | EXTENDED_RELEASE_TABLET | Freq: Every day | ORAL | Status: DC

## 2015-04-21 NOTE — Progress Notes (Signed)
Name: [name]   MRN: [id number]    DOB: [birth year]   Date:04/21/2015       Progress Note  Subjective  Chief Complaint  Chief Complaint  Patient presents with  . Establish Care    Dr Jacqualine Code pt.    Hypertension This is a chronic problem. Associated symptoms include anxiety, neck pain, palpitations and shortness of breath. Pertinent negatives include no blurred vision, chest pain or headaches. Past treatments include angiotensin blockers, calcium channel blockers and diuretics. The current treatment provides significant improvement. Hypertensive end-organ damage includes a thyroid problem. There is no history of angina, kidney disease, CAD/MI or CVA.  Thyroid Problem Presents for follow-up visit. Symptoms include anxiety, dry skin, palpitations and weight loss (has lost 10 lbs.). Patient reports no cold intolerance, depressed mood, leg swelling or weight gain. The symptoms have been stable. Past treatments include levothyroxine. Her past medical history is significant for hyperlipidemia. There is no history of diabetes.  Anxiety Presents for follow-up visit. Symptoms include nervous/anxious behavior, palpitations and shortness of breath. Patient reports no chest pain, depressed mood, dizziness or insomnia. Symptoms occur most days.   Past treatments include non-benzodiazephine anxiolytics. The treatment provided significant relief. Compliance with prior treatments has been good.  Neck Pain  This is a chronic problem. The current episode started more than 1 year ago (present for over 40 years.). 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 The pain is moderate. Associated symptoms include weight loss (has lost 10 lbs.). Pertinent negatives include no chest pain, headaches, numbness or tingling. She has tried acetaminophen and muscle relaxants for the symptoms. The treatment provided moderate relief.  Supraventricular Tachycardia Pt. Has history of SVT, last  episode 3 years ago when her heart rate went up to 250 beats/min. She has had ablation therapy by Cardiology. Currently well-controlled on daily beta blocker therapy.    Past Medical History  Diagnosis Date  . SVT (supraventricular tachycardia)     a. s/p ablation by Dr Lovena Le 5/13  . LBBB (left bundle branch block)   . Hypertension   . Hypothyroid   . Hyperlipidemia   . Bronchitis   . DJD (degenerative joint disease), cervical     Past Surgical History  Procedure Laterality Date  . Eps and ablation for svt  5/13    slow pathway ablation by Dr Lovena Le  . Electrophysiology study N/A 02/27/2012    Procedure: ELECTROPHYSIOLOGY STUDY;  Surgeon: Evans Lance, MD;  Location: [facility] CATH LAB;  Service: Cardiovascular;  Laterality: N/A;  . Supraventricular tachycardia ablation N/A 02/27/2012    Procedure: SUPRAVENTRICULAR TACHYCARDIA ABLATION;  Surgeon: Evans Lance, MD;  Location: [facility] CATH LAB;  Service: Cardiovascular;  Laterality: N/A;  . Eye surgery      surgery for detatched retina    Family History  Problem Relation Age of Onset  . Alzheimer's disease Mother   . Cancer Father   . Stroke Father   . Cancer Paternal Aunt   . Thyroid disease Maternal Grandmother     History   Social History  . Marital Status: Married    Spouse Name: N/A  . Number of Children: N/A  . Years of Education: N/A   Occupational History  . Not on file.   Social History Main Topics  . Smoking status: Never Smoker   . Smokeless tobacco: Not on file  . Alcohol Use: No  . Drug Use: No  . Sexual Activity: Not on file   Other  Topics Concern  . Not on file   Social History Narrative   Lives in [place]     Current outpatient prescriptions:  .  acetaminophen (TYLENOL) 325 MG tablet, Take 650 mg by mouth every 6 (six) hours as needed. For pain, Disp: , Rfl:  .  amLODipine (NORVASC) 2.5 MG tablet, Take 1 tablet (2.5 mg total) by mouth daily., Disp: 90 tablet, Rfl: 0 .  aspirin EC 81 MG tablet,  Take 81 mg by mouth daily., Disp: , Rfl:  .  atorvastatin (LIPITOR) 20 MG tablet, Take 20 mg by mouth daily., Disp: , Rfl:  .  busPIRone (BUSPAR) 5 MG tablet, Take 1.5 tablets (7.5 mg total) by mouth 2 (two) times daily., Disp: 270 tablet, Rfl: 0 .  cholecalciferol (VITAMIN D) 1000 UNITS tablet, Take 2,000 Units by mouth daily., Disp: , Rfl:  .  cyclobenzaprine (FLEXERIL) 10 MG tablet, Take 1 tablet (10 mg total) by mouth at bedtime., Disp: 90 tablet, Rfl: 0 .  levothyroxine (SYNTHROID, LEVOTHROID) 112 MCG tablet, Take 1 tablet (112 mcg total) by mouth daily before breakfast., Disp: 90 tablet, Rfl: 0 .  loratadine (CLARITIN) 10 MG tablet, Take 10 mg by mouth daily., Disp: , Rfl:  .  losartan-hydrochlorothiazide (HYZAAR) 100-12.5 MG per tablet, Take 1 tablet by mouth daily., Disp: , Rfl:  .  metoprolol succinate (TOPROL-XL) 50 MG 24 hr tablet, Take 1 tablet (50 mg total) by mouth daily., Disp: 90 tablet, Rfl: 0 .  Multiple Vitamin (MULITIVITAMIN WITH MINERALS) TABS, Take 1 tablet by mouth daily., Disp: , Rfl:  .  potassium chloride (K-DUR) 10 MEQ tablet, Take 1 tablet (10 mEq total) by mouth daily., Disp: 90 tablet, Rfl: 0  Allergies  Allergen Reactions  . Codeine Nausea And Vomiting  . Erythromycin Swelling  . Penicillins Swelling  . Septra [Sulfamethoxazole-Trimethoprim] Swelling     Review of Systems  Constitutional: Positive for weight loss (has lost 10 lbs.). Negative for weight gain.  Eyes: Negative for blurred vision.  Respiratory: Positive for shortness of breath.   Cardiovascular: Positive for palpitations. Negative for chest pain.  Musculoskeletal: Positive for neck pain.  Neurological: Negative for dizziness, tingling, numbness and headaches.  Endo/Heme/Allergies: Negative for cold intolerance.  Psychiatric/Behavioral: The patient is nervous/anxious. The patient does not have insomnia.       Objective  Filed Vitals:   04/21/15 0844  BP: 146/78  Pulse: 76  Temp: 97.7  F (36.5 C)  TempSrc: Oral  Height: 5\' 7"  (1.702 m)  Weight: 153 lb 6.4 oz (69.582 kg)    Physical Exam  Constitutional: She is oriented to person, place, and time and well-developed, well-nourished, and in no distress.  HENT:  Head: Normocephalic and atraumatic.  Cardiovascular: Normal rate and regular rhythm.   Pulmonary/Chest: Effort normal and breath sounds normal.  Musculoskeletal: She exhibits no edema.       Cervical back: She exhibits tenderness and spasm.  Neurological: She is alert and oriented to person, place, and time.  Skin: Skin is warm and dry.  Psychiatric: Affect and judgment normal.  Nursing note and vitals reviewed.      No results found for this or any previous visit (from the past 2160 hour(s)).   Assessment & Plan 1. SVT (supraventricular tachycardia) Status post ablation. Heart rate is well controlled. Continue beta blocker therapy - metoprolol succinate (TOPROL-XL) 50 MG 24 hr tablet; Take 1 tablet (50 mg total) by mouth daily.  Dispense: 90 tablet; Refill: 0  2. Essential hypertension  Blood pressure is slightly elevated and her office today but patient has history of white coat hypertension in the past. She will follow-up in 3 months. - amLODipine (NORVASC) 2.5 MG tablet; Take 1 tablet (2.5 mg total) by mouth daily.  Dispense: 90 tablet; Refill: 0 - metoprolol succinate (TOPROL-XL) 50 MG 24 hr tablet; Take 1 tablet (50 mg total) by mouth daily.  Dispense: 90 tablet; Refill: 0  3. Other specified hypothyroidism Last TSH was mildly elevated above normal. We will repeat TSH today. - levothyroxine (SYNTHROID, LEVOTHROID) 112 MCG tablet; Take 1 tablet (112 mcg total) by mouth daily before breakfast.  Dispense: 90 tablet; Refill: 0 - TSH  4. DJD (degenerative joint disease) of cervical spine  - cyclobenzaprine (FLEXERIL) 10 MG tablet; Take 1 tablet (10 mg total) by mouth at bedtime.  Dispense: 90 tablet; Refill: 0  5. Anxiety Patient on buspirone  daily for symptoms of anxiety and it seems to be working well. Continue current management. - busPIRone (BUSPAR) 5 MG tablet; Take 1.5 tablets (7.5 mg total) by mouth 2 (two) times daily.  Dispense: 270 tablet; Refill: 0  6. Hyperlipidemia We'll recheck fasting lipid panel. Patient is on Lipitor 20 mg at bedtime. - Lipid Profile  7. Diuretic-induced hypokalemia  - Comprehensive Metabolic Panel (CMET)   Sarin Comunale [facility]ad [facility] 04/21/2015 9:29 AM

## 2015-04-22 ENCOUNTER — Other Ambulatory Visit: Payer: Self-pay

## 2015-04-22 DIAGNOSIS — E0590 Thyrotoxicosis, unspecified without thyrotoxic crisis or storm: Secondary | ICD-10-CM

## 2015-04-22 LAB — COMPREHENSIVE METABOLIC PANEL
ALT: 13 IU/L (ref 0–32)
AST: 19 IU/L (ref 0–40)
Albumin/Globulin Ratio: 2.2 (ref 1.1–2.5)
Albumin: 4.8 g/dL (ref 3.5–4.8)
Alkaline Phosphatase: 70 IU/L (ref 39–117)
BUN / CREAT RATIO: 17 (ref 11–26)
BUN: 15 mg/dL (ref 8–27)
Bilirubin Total: 0.6 mg/dL (ref 0.0–1.2)
CHLORIDE: 97 mmol/L (ref 97–108)
CO2: 23 mmol/L (ref 18–29)
CREATININE: 0.86 mg/dL (ref 0.57–1.00)
Calcium: 9.7 mg/dL (ref 8.7–10.3)
GFR calc non Af Amer: 68 mL/min/{1.73_m2} (ref >59)
GFR, EST AFRICAN AMERICAN: 78 mL/min/{1.73_m2} (ref >59)
GLOBULIN, TOTAL: 2.2 g/dL (ref 1.5–4.5)
Glucose: 119 mg/dL — ABNORMAL HIGH (ref 65–99)
POTASSIUM: 3.7 mmol/L (ref 3.5–5.2)
Sodium: 138 mmol/L (ref 134–144)
TOTAL PROTEIN: 7 g/dL (ref 6.0–8.5)

## 2015-04-22 LAB — LIPID PANEL
CHOL/HDL RATIO: 2.9 ratio (ref 0.0–4.4)
Cholesterol, Total: 162 mg/dL (ref 100–199)
HDL: 55 mg/dL (ref >39)
LDL Calculated: 80 mg/dL (ref 0–99)
Triglycerides: 134 mg/dL (ref 0–149)
VLDL Cholesterol Cal: 27 mg/dL (ref 5–40)

## 2015-04-22 LAB — TSH: TSH: 5.96 u[IU]/mL — AB (ref 0.450–4.500)

## 2015-07-22 ENCOUNTER — Ambulatory Visit (INDEPENDENT_AMBULATORY_CARE_PROVIDER_SITE_OTHER): Payer: Medicare Other | Admitting: Family Medicine

## 2015-07-22 ENCOUNTER — Encounter: Payer: Self-pay | Admitting: Family Medicine

## 2015-07-22 VITALS — BP 144/72 | HR 73 | Temp 98.0°F | Resp 17 | Ht 67.0 in | Wt 151.1 lb

## 2015-07-22 DIAGNOSIS — Z23 Encounter for immunization: Secondary | ICD-10-CM

## 2015-07-22 DIAGNOSIS — I10 Essential (primary) hypertension: Secondary | ICD-10-CM

## 2015-07-22 DIAGNOSIS — E038 Other specified hypothyroidism: Secondary | ICD-10-CM | POA: Diagnosis not present

## 2015-07-22 MED ORDER — METOPROLOL SUCCINATE ER 50 MG PO TB24: 50.0000 mg | ORAL_TABLET | Freq: Every day | ORAL | Status: DC

## 2015-07-22 NOTE — Progress Notes (Signed)
Name: [name]   MRN: [id number]    DOB: 03/24/1941   Date:07/22/2015       Progress Note  Subjective  Chief Complaint  Chief Complaint  Patient presents with  . Follow-up    3 MO  . Hyperlipidemia  . Anxiety  . Hypertension  . Medication Refill    Hypertension This is a chronic problem. Pertinent negatives include no blurred vision, chest pain, headaches or palpitations. Past treatments include calcium channel blockers, diuretics and angiotensin blockers. Hypertensive end-organ damage includes a thyroid problem.  Thyroid Problem Presents for follow-up visit. Patient reports no constipation, depressed mood, dry skin or palpitations. The symptoms have been stable. Past treatments include levothyroxine.    Past Medical History  Diagnosis Date  . SVT (supraventricular tachycardia)     a. s/p ablation by Dr Lovena Le 5/13  . LBBB (left bundle branch block)   . Hypertension   . Hypothyroid   . Hyperlipidemia   . Bronchitis   . DJD (degenerative joint disease), cervical     Past Surgical History  Procedure Laterality Date  . Eps and ablation for svt  5/13    slow pathway ablation by Dr Lovena Le  . Electrophysiology study N/A 02/27/2012    Procedure: ELECTROPHYSIOLOGY STUDY;  Surgeon: Evans Lance, MD;  Location: [facility] CATH LAB;  Service: Cardiovascular;  Laterality: N/A;  . Supraventricular tachycardia ablation N/A 02/27/2012    Procedure: SUPRAVENTRICULAR TACHYCARDIA ABLATION;  Surgeon: Evans Lance, MD;  [facility] C[facility] LAB;  Service: Cardiovascular;  Laterality: N/A;  . Eye surgery      surgery for detatched retina    Family History  Problem Relation Age of Onset  . Alzheimer's disease Mother   . Cancer Father   . Stroke Father   . Cancer Paternal Aunt   . Thyroid disease Maternal Grandmother     Social History   Social History  . Marital Status: Married    Spouse Name: N/A  . Number of Children: N/A  . Years of Education: N/A   Occupational History  . Not  on file.   Social History Main Topics  . Smoking status: Never Smoker   . Smokeless tobacco: Not on file  . Alcohol Use: No  . Drug Use: No  . Sexual Activity: Not on file   Other Topics Concern  . Not on file   Social History Narrative   Lives in [place]    Current outpatient prescriptions:  .  acetaminophen (TYLENOL) 325 MG tablet, Take 650 mg by mouth every 6 (six) hours as needed. For pain, Disp: , Rfl:  .  amLODipine (NORVASC) 2.5 MG tablet, Take 1 tablet (2.5 mg total) by mouth daily., Disp: 90 tablet, Rfl: 0 .  aspirin EC 81 MG tablet, Take 81 mg by mouth daily., Disp: , Rfl:  .  atorvastatin (LIPITOR) 20 MG tablet, Take 20 mg by mouth daily., Disp: , Rfl:  .  busPIRone (BUSPAR) 5 MG tablet, Take 1.5 tablets (7.5 mg total) by mouth 2 (two) times daily., Disp: 270 tablet, Rfl: 0 .  cholecalciferol (VITAMIN D) 1000 UNITS tablet, Take 2,000 Units by mouth daily., Disp: , Rfl:  .  cyclobenzaprine (FLEXERIL) 10 MG tablet, Take 1 tablet (10 mg total) by mouth at bedtime., Disp: 90 tablet, Rfl: 0 .  levothyroxine (SYNTHROID, LEVOTHROID) 112 MCG tablet, Take 1 tablet (112 mcg total) by mouth daily before breakfast., Disp: 90 tablet, Rfl: 0 .  loratadine (CLARITIN) 10 MG tablet, Take 10  mg by mouth daily., Disp: , Rfl:  .  losartan-hydrochlorothiazide (HYZAAR) 100-12.5 MG per tablet, Take 1 tablet by mouth daily., Disp: , Rfl:  .  metoprolol succinate (TOPROL-XL) 50 MG 24 hr tablet, Take 1 tablet (50 mg total) by mouth daily., Disp: 90 tablet, Rfl: 0 .  Multiple Vitamin (MULITIVITAMIN WITH MINERALS) TABS, Take 1 tablet by mouth daily., Disp: , Rfl:  .  potassium chloride (K-DUR) 10 MEQ tablet, Take 1 tablet (10 mEq total) by mouth daily., Disp: 90 tablet, Rfl: 0  Allergies  Allergen Reactions  . Codeine Nausea And Vomiting  . Erythromycin Swelling  . Penicillins Swelling  . Septra [Sulfamethoxazole-Trimethoprim] Swelling   Review of Systems  Eyes: Negative for blurred vision.   Cardiovascular: Negative for chest pain and palpitations.  Gastrointestinal: Negative for constipation.  Neurological: Negative for headaches.   Objective  Filed Vitals:   07/22/15 0837  BP: 144/72  Pulse: 73  Temp: 98 F (36.7 C)  TempSrc: Oral  Resp: 17  Height: 5\' 7"  (1.702 m)  Weight: 151 lb 1.6 oz (68.539 kg)  SpO2: 98%    Physical Exam  Constitutional: She is well-developed, well-nourished, and in no distress.  Neck: No thyroid mass and no thyromegaly present.  Cardiovascular: Normal rate and regular rhythm.   Pulmonary/Chest: Effort normal and breath sounds normal.  Musculoskeletal:       Right ankle: She exhibits swelling.       Left ankle: She exhibits swelling.  LE swelling L>R  Nursing note and vitals reviewed.   Assessment & Plan  1. Need for immunization against influenza  - Flu vaccine HIGH DOSE PF (Fluzone High dose)  2. Essential hypertension  - metoprolol succinate (TOPROL-XL) 50 MG 24 hr tablet; Take 1 tablet (50 mg total) by mouth daily.  Dispense: 90 tablet; Refill: 0  3. Other specified hypothyroidism  - TSH - T4, free      Syed Asad A. [facility] 07/22/2015 8:51 AM

## 2015-07-23 LAB — T4, FREE: Free T4: 1.51 ng/dL (ref 0.82–1.77)

## 2015-07-23 LAB — TSH: TSH: 2.55 u[IU]/mL (ref 0.450–4.500)

## 2015-08-24 ENCOUNTER — Other Ambulatory Visit: Payer: Self-pay | Admitting: Family Medicine

## 2015-08-25 ENCOUNTER — Telehealth: Payer: Self-pay

## 2015-08-25 DIAGNOSIS — E038 Other specified hypothyroidism: Secondary | ICD-10-CM

## 2015-08-25 MED ORDER — LEVOTHYROXINE SODIUM 112 MCG PO TABS: 112.0000 ug | ORAL_TABLET | Freq: Every day | ORAL | Status: DC

## 2015-08-25 NOTE — Telephone Encounter (Signed)
Medication has been refilled and sent to [facility] 

## 2015-09-21 ENCOUNTER — Other Ambulatory Visit: Payer: Self-pay | Admitting: Family Medicine

## 2015-09-24 ENCOUNTER — Other Ambulatory Visit: Payer: Self-pay | Admitting: Family Medicine

## 2015-10-20 ENCOUNTER — Other Ambulatory Visit: Payer: Self-pay | Admitting: Family Medicine

## 2015-10-21 ENCOUNTER — Ambulatory Visit: Payer: Medicare Other | Admitting: Family Medicine

## 2015-10-27 ENCOUNTER — Other Ambulatory Visit: Payer: Self-pay | Admitting: Family Medicine

## 2015-11-01 ENCOUNTER — Ambulatory Visit: Payer: Medicare Other | Admitting: Family Medicine

## 2015-11-05 ENCOUNTER — Ambulatory Visit (INDEPENDENT_AMBULATORY_CARE_PROVIDER_SITE_OTHER): Payer: Medicare Other | Admitting: Family Medicine

## 2015-11-05 ENCOUNTER — Encounter: Payer: Self-pay | Admitting: Family Medicine

## 2015-11-05 VITALS — BP 132/78 | HR 83 | Temp 97.6°F | Resp 16 | Ht 67.0 in | Wt 151.5 lb

## 2015-11-05 DIAGNOSIS — E785 Hyperlipidemia, unspecified: Secondary | ICD-10-CM | POA: Diagnosis not present

## 2015-11-05 MED ORDER — ATORVASTATIN CALCIUM 20 MG PO TABS: 20.0000 mg | ORAL_TABLET | Freq: Every day | ORAL | Status: DC

## 2015-11-05 NOTE — Progress Notes (Signed)
Name: [name]   MRN: [id number]    DOB: 1941/07/04   Date:11/05/2015       Progress Note  Subjective  Chief Complaint  Chief Complaint  Patient presents with  . Medication Refill    follow-up  . Hypertension  . Hyperlipidemia  . Hypothyroidism    Hyperlipidemia This is a chronic problem. The problem is controlled. Recent lipid tests were reviewed and are normal. Pertinent negatives include no leg pain or myalgias. Current antihyperlipidemic treatment includes statins. The current treatment provides significant improvement of lipids. There are no compliance problems.       Past Medical History  Diagnosis Date  . SVT (supraventricular tachycardia) ([place])     a. s/p ablation by Dr Lovena Le 5/13  . LBBB (left bundle branch block)   . Hypertension   . Hypothyroid   . Hyperlipidemia   . Bronchitis   . DJD (degenerative joint disease), cervical     Past Surgical History  Procedure Laterality Date  . Eps and ablation for svt  5/13    slow pathway ablation by Dr Lovena Le  . Electrophysiology study N/A 02/27/2012    Procedure: ELECTROPHYSIOLOGY STUDY;  Surgeon: Evans Lance, MD;  Location: [facility] CATH LAB;  Service: Cardiovascular;  Laterality: N/A;  . Supraventricular tachycardia ablation N/A 02/27/2012    Procedure: SUPRAVENTRICULAR TACHYCARDIA ABLATION;  Surgeon: Evans Lance, MD;  Location: [facility] CATH LAB;  Service: Cardiovascular;  Laterality: N/A;  . Eye surgery      surgery for detatched retina    Family History  Problem Relation Age of Onset  . Alzheimer's disease Mother   . Cancer Father   . Stroke Father   . Cancer Paternal Aunt   . Thyroid disease Maternal Grandmother     Social History   Social History  . Marital Status: Married    Spouse Name: N/A  . Number of Children: N/A  . Years of Education: N/A   Occupational History  . Not on file.   Social History Main Topics  . Smoking status: Never Smoker   . Smokeless tobacco: Not on file  . Alcohol Use: No   . Drug Use: No  . Sexual Activity: Not on file   Other Topics Concern  . Not on file   Social History Narrative   Lives in [place]     Current outpatient prescriptions:  .  acetaminophen (TYLENOL) 325 MG tablet, Take 650 mg by mouth every 6 (six) hours as needed. For pain, Disp: , Rfl:  .  amLODipine (NORVASC) 2.5 MG tablet, Take 1 tablet (2.5 mg total) by mouth daily., Disp: 90 tablet, Rfl: 0 .  aspirin EC 81 MG tablet, Take 81 mg by mouth daily., Disp: , Rfl:  .  atorvastatin (LIPITOR) 20 MG tablet, Take 20 mg by mouth daily., Disp: , Rfl:  .  busPIRone (BUSPAR) 5 MG tablet, Take 1.5 tablets (7.5 mg total) by mouth 2 (two) times daily., Disp: 270 tablet, Rfl: 0 .  cholecalciferol (VITAMIN D) 1000 UNITS tablet, Take 2,000 Units by mouth daily., Disp: , Rfl:  .  cyclobenzaprine (FLEXERIL) 10 MG tablet, TAKE ONE TABLET BY MOUTH AT BEDTIME, Disp: 90 tablet, Rfl: 0 .  levothyroxine (SYNTHROID, LEVOTHROID) 112 MCG tablet, Take 1 tablet (112 mcg total) by mouth daily before breakfast., Disp: 90 tablet, Rfl: 0 .  loratadine (CLARITIN) 10 MG tablet, Take 10 mg by mouth daily., Disp: , Rfl:  .  losartan-hydrochlorothiazide (HYZAAR) 100-12.5 MG tablet, TAKE 1 TABLET  BY MOUTH DAILY, Disp: 90 tablet, Rfl: 0 .  metoprolol succinate (TOPROL-XL) 50 MG 24 hr tablet, TAKE 1 TABLET(50 MG) BY MOUTH DAILY, Disp: 90 tablet, Rfl: 0 .  Multiple Vitamin (MULITIVITAMIN WITH MINERALS) TABS, Take 1 tablet by mouth daily., Disp: , Rfl:  .  potassium chloride (K-DUR) 10 MEQ tablet, Take 1 tablet (10 mEq total) by mouth daily., Disp: 90 tablet, Rfl: 0 .  potassium chloride (MICRO-K) 10 MEQ CR capsule, TAKE ONE CAPSULE BY MOUTH EVERY DAY, Disp: 90 capsule, Rfl: 0  Allergies  Allergen Reactions  . Codeine Nausea And Vomiting  . Erythromycin Swelling  . Penicillins Swelling  . Septra [Sulfamethoxazole-Trimethoprim] Swelling     Review of Systems  Musculoskeletal: Negative for myalgias.       Objective  Filed Vitals:   11/05/15 0923  BP: 132/78  Pulse: 83  Temp: 97.6 F (36.4 C)  TempSrc: Oral  Resp: 16  Height: 5\' 7"  (1.702 m)  Weight: 151 lb 8 oz (68.72 kg)  SpO2: 97%    Physical Exam  Constitutional: She is well-developed, well-nourished, and in no distress.  Cardiovascular: Normal rate and regular rhythm.   Pulmonary/Chest: Effort normal and breath sounds normal.  Abdominal: Soft. Bowel sounds are normal.  Skin: Skin is warm and dry.     Assessment & Plan  1. Hyperlipidemia  - Lipid Profile - Comprehensive Metabolic Panel (CMET) - atorvastatin (LIPITOR) 20 MG tablet; Take 1 tablet (20 mg total) by mouth daily at 6 PM.  Dispense: 90 tablet; Refill: 1   Adelle Zachar Asad A[facility] 11/05/2015 10:23 AM

## 2015-11-06 LAB — COMPREHENSIVE METABOLIC PANEL
A/G RATIO: 1.8 (ref 1.1–2.5)
ALBUMIN: 4.6 g/dL (ref 3.5–4.8)
ALK PHOS: 82 IU/L (ref 39–117)
ALT: 13 IU/L (ref 0–32)
AST: 15 IU/L (ref 0–40)
BUN / CREAT RATIO: 27 — AB (ref 11–26)
BUN: 18 mg/dL (ref 8–27)
Bilirubin Total: 0.6 mg/dL (ref 0.0–1.2)
CO2: 22 mmol/L (ref 18–29)
CREATININE: 0.67 mg/dL (ref 0.57–1.00)
Calcium: 9.4 mg/dL (ref 8.7–10.3)
Chloride: 101 mmol/L (ref 96–106)
GFR calc Af Amer: 101 mL/min/{1.73_m2} (ref >59)
GFR, EST NON AFRICAN AMERICAN: 87 mL/min/{1.73_m2} (ref >59)
GLOBULIN, TOTAL: 2.5 g/dL (ref 1.5–4.5)
Glucose: 115 mg/dL — ABNORMAL HIGH (ref 65–99)
POTASSIUM: 4.4 mmol/L (ref 3.5–5.2)
SODIUM: 143 mmol/L (ref 134–144)
Total Protein: 7.1 g/dL (ref 6.0–8.5)

## 2015-11-06 LAB — LIPID PANEL
CHOLESTEROL TOTAL: 178 mg/dL (ref 100–199)
Chol/HDL Ratio: 3.6 ratio units (ref 0.0–4.4)
HDL: 50 mg/dL (ref >39)
LDL CALC: 93 mg/dL (ref 0–99)
TRIGLYCERIDES: 174 mg/dL — AB (ref 0–149)
VLDL CHOLESTEROL CAL: 35 mg/dL (ref 5–40)

## 2015-11-21 ENCOUNTER — Other Ambulatory Visit: Payer: Self-pay | Admitting: Family Medicine

## 2015-11-24 ENCOUNTER — Other Ambulatory Visit: Payer: Self-pay | Admitting: Family Medicine

## 2015-12-22 ENCOUNTER — Other Ambulatory Visit: Payer: Self-pay | Admitting: Family Medicine

## 2016-01-26 ENCOUNTER — Other Ambulatory Visit: Payer: Self-pay | Admitting: Family Medicine

## 2016-02-19 ENCOUNTER — Other Ambulatory Visit: Payer: Self-pay | Admitting: Family Medicine

## 2016-02-20 ENCOUNTER — Other Ambulatory Visit: Payer: Self-pay | Admitting: Family Medicine

## 2016-02-21 ENCOUNTER — Other Ambulatory Visit: Payer: Self-pay | Admitting: Family Medicine

## 2016-02-21 ENCOUNTER — Telehealth: Payer: Self-pay | Admitting: Family Medicine

## 2016-02-21 DIAGNOSIS — I10 Essential (primary) hypertension: Secondary | ICD-10-CM

## 2016-02-21 DIAGNOSIS — E038 Other specified hypothyroidism: Secondary | ICD-10-CM

## 2016-02-21 NOTE — Telephone Encounter (Signed)
Patient prescriptions where denied stated that she need appointment. Wanted me to inform you that she has appointment for 03-06-16 and asking that you please refill Losartan, Levothyroxin, and Buspirone. Please send to [facility]

## 2016-02-22 MED ORDER — LEVOTHYROXINE SODIUM 112 MCG PO TABS: 112.0000 ug | ORAL_TABLET | Freq: Every day | ORAL | Status: DC

## 2016-02-22 MED ORDER — LOSARTAN POTASSIUM-HCTZ 100-12.5 MG PO TABS: 1.0000 | ORAL_TABLET | Freq: Every day | ORAL | Status: DC

## 2016-02-22 NOTE — Telephone Encounter (Signed)
Patient informed. 

## 2016-02-22 NOTE — Telephone Encounter (Signed)
Prescriptions have been sent to pharmacy 

## 2016-03-06 ENCOUNTER — Ambulatory Visit (INDEPENDENT_AMBULATORY_CARE_PROVIDER_SITE_OTHER): Payer: Medicare Other | Admitting: Family Medicine

## 2016-03-06 ENCOUNTER — Encounter: Payer: Self-pay | Admitting: Family Medicine

## 2016-03-06 VITALS — BP 138/76 | HR 72 | Temp 97.5°F | Resp 18 | Ht 67.0 in | Wt 153.1 lb

## 2016-03-06 DIAGNOSIS — E785 Hyperlipidemia, unspecified: Secondary | ICD-10-CM

## 2016-03-06 DIAGNOSIS — R739 Hyperglycemia, unspecified: Secondary | ICD-10-CM | POA: Diagnosis not present

## 2016-03-06 LAB — POCT GLYCOSYLATED HEMOGLOBIN (HGB A1C): HEMOGLOBIN A1C: 5.5

## 2016-03-06 NOTE — Progress Notes (Signed)
Name: [name]   MRN: [id number]    DOB: May 22, 1941   Date:03/06/2016       Progress Note  Subjective  Chief Complaint  Chief Complaint  Patient presents with  . Hyperlipidemia    pt here for 4 month follow up    Hyperlipidemia This is a chronic problem. The problem is controlled. Recent lipid tests were reviewed and are normal. Pertinent negatives include no chest pain, leg pain, myalgias or shortness of breath. Current antihyperlipidemic treatment includes statins. The current treatment provides significant improvement of lipids. There are no compliance problems.     Past Medical History  Diagnosis Date  . SVT (supraventricular tachycardia) ([place])     a. s/p ablation by Dr Lovena Le 5/13  . LBBB (left bundle branch block)   . Hypertension   . Hypothyroid   . Hyperlipidemia   . Bronchitis   . DJD (degenerative joint disease), cervical     Past Surgical History  Procedure Laterality Date  . Eps and ablation for svt  5/13    slow pathway ablation by Dr Lovena Le  . Electrophysiology study N/A 02/27/2012    Procedure: ELECTROPHYSIOLOGY STUDY;  Surgeon: Evans Lance, MD;  Location: [facility] CATH LAB;  Service: Cardiovascular;  Laterality: N/A;  . Supraventricular tachycardia ablation N/A 02/27/2012    Procedure: SUPRAVENTRICULAR TACHYCARDIA ABLATION;  Surgeon: Evans Lance, MD;  Location: [facility] CATH LAB;  Service: Cardiovascular;  Laterality: N/A;  . Eye surgery      surgery for detatched retina    Family History  Problem Relation Age of Onset  . Alzheimer's disease Mother   . Cancer Father   . Stroke Father   . Cancer Paternal Aunt   . Thyroid disease Maternal Grandmother     Social History   Social History  . Marital Status: Married    Spouse Name: N/A  . Number of Children: N/A  . Years of Education: N/A   Occupational History  . Not on file.   Social History Main Topics  . Smoking status: Never Smoker   . Smokeless tobacco: Not on file  . Alcohol Use: No  . Drug  Use: No  . Sexual Activity: Not on file   Other Topics Concern  . Not on file   Social History Narrative   Lives in [place]     Current outpatient prescriptions:  .  acetaminophen (TYLENOL) 325 MG tablet, Take 650 mg by mouth every 6 (six) hours as needed. For pain, Disp: , Rfl:  .  amLODipine (NORVASC) 2.5 MG tablet, TAKE 1 TABLET BY MOUTH DAILY, Disp: 90 tablet, Rfl: 0 .  aspirin EC 81 MG tablet, Take 81 mg by mouth daily., Disp: , Rfl:  .  atorvastatin (LIPITOR) 20 MG tablet, Take 1 tablet (20 mg total) by mouth daily at 6 PM., Disp: 90 tablet, Rfl: 1 .  busPIRone (BUSPAR) 5 MG tablet, TAKE 1 AND 1/2 TABLETS BY MOUTH TWICE DAILY, Disp: 270 tablet, Rfl: 0 .  cholecalciferol (VITAMIN D) 1000 UNITS tablet, Take 2,000 Units by mouth daily., Disp: , Rfl:  .  cyclobenzaprine (FLEXERIL) 10 MG tablet, TAKE ONE TABLET BY MOUTH AT BEDTIME, Disp: 90 tablet, Rfl: 0 .  levothyroxine (SYNTHROID, LEVOTHROID) 112 MCG tablet, Take 1 tablet (112 mcg total) by mouth daily before breakfast., Disp: 90 tablet, Rfl: 0 .  loratadine (CLARITIN) 10 MG tablet, Take 10 mg by mouth daily., Disp: , Rfl:  .  losartan-hydrochlorothiazide (HYZAAR) 100-12.5 MG tablet, Take 1 tablet  by mouth daily., Disp: 90 tablet, Rfl: 0 .  metoprolol succinate (TOPROL-XL) 50 MG 24 hr tablet, TAKE 1 TABLET BY MOUTH EVERY DAY, Disp: 90 tablet, Rfl: 0 .  Multiple Vitamin (MULITIVITAMIN WITH MINERALS) TABS, Take 1 tablet by mouth daily., Disp: , Rfl:  .  potassium chloride (MICRO-K) 10 MEQ CR capsule, TAKE ONE CAPSULE BY MOUTH EVERY DAY, Disp: 90 capsule, Rfl: 0  Allergies  Allergen Reactions  . Codeine Nausea And Vomiting  . Erythromycin Swelling  . Penicillins Swelling  . Septra [Sulfamethoxazole-Trimethoprim] Swelling     Review of Systems  Respiratory: Negative for shortness of breath.   Cardiovascular: Negative for chest pain.  Musculoskeletal: Negative for myalgias.     Objective  Filed Vitals:   03/06/16 0833   BP: 138/76  Pulse: 72  Temp: 97.5 F (36.4 C)  Resp: 18  Height: 5\' 7"  (1.702 m)  Weight: 153 lb 2 oz (69.457 kg)  SpO2: 98%    Physical Exam  Constitutional: She is well-developed, well-nourished, and in no distress.  Cardiovascular: Normal rate and regular rhythm.   Pulmonary/Chest: Effort normal and breath sounds normal.  Abdominal: Soft. Bowel sounds are normal.  Skin: Skin is warm and dry.      Assessment & Plan  1. Hyperlipidemia Obtain FLP and adjust statin as necessary - Lipid Profile  2. Hyperglycemia Rule out diabetes mellitus by obtaining A1c - POCT HgB A1C  Raesha Coonrod Asad A[facility] 03/06/2016 8:44 AM

## 2016-03-07 LAB — LIPID PANEL
CHOL/HDL RATIO: 3.1 ratio (ref 0.0–4.4)
CHOLESTEROL TOTAL: 161 mg/dL (ref 100–199)
HDL: 52 mg/dL (ref >39)
LDL Calculated: 79 mg/dL (ref 0–99)
TRIGLYCERIDES: 149 mg/dL (ref 0–149)
VLDL Cholesterol Cal: 30 mg/dL (ref 5–40)

## 2016-03-21 ENCOUNTER — Other Ambulatory Visit: Payer: Self-pay | Admitting: Family Medicine

## 2016-04-23 ENCOUNTER — Other Ambulatory Visit: Payer: Self-pay | Admitting: Family Medicine

## 2016-04-28 ENCOUNTER — Other Ambulatory Visit: Payer: Self-pay | Admitting: Family Medicine

## 2016-05-19 ENCOUNTER — Other Ambulatory Visit: Payer: Self-pay | Admitting: Family Medicine

## 2016-05-20 ENCOUNTER — Other Ambulatory Visit: Payer: Self-pay | Admitting: Family Medicine

## 2016-05-20 DIAGNOSIS — I10 Essential (primary) hypertension: Secondary | ICD-10-CM

## 2016-05-22 ENCOUNTER — Other Ambulatory Visit: Payer: Self-pay | Admitting: Family Medicine

## 2016-05-22 DIAGNOSIS — E038 Other specified hypothyroidism: Secondary | ICD-10-CM

## 2016-05-22 DIAGNOSIS — I10 Essential (primary) hypertension: Secondary | ICD-10-CM

## 2016-05-26 MED ORDER — LOSARTAN POTASSIUM-HCTZ 100-12.5 MG PO TABS: 1.0000 | ORAL_TABLET | Freq: Every day | ORAL | 0 refills | Status: DC

## 2016-05-26 NOTE — Telephone Encounter (Signed)
Medication has been refilled and sent to [facility] per pharmacy request pt has been notified

## 2016-06-06 ENCOUNTER — Ambulatory Visit: Payer: Medicare Other | Admitting: Family Medicine

## 2016-06-06 ENCOUNTER — Encounter: Payer: Self-pay | Admitting: Family Medicine

## 2016-06-06 ENCOUNTER — Other Ambulatory Visit: Payer: Self-pay | Admitting: Family Medicine

## 2016-06-06 ENCOUNTER — Ambulatory Visit (INDEPENDENT_AMBULATORY_CARE_PROVIDER_SITE_OTHER): Payer: Medicare Other | Admitting: Family Medicine

## 2016-06-06 VITALS — BP 138/76 | HR 86 | Temp 97.8°F | Resp 14 | Wt 151.6 lb

## 2016-06-06 DIAGNOSIS — E038 Other specified hypothyroidism: Secondary | ICD-10-CM

## 2016-06-06 DIAGNOSIS — E785 Hyperlipidemia, unspecified: Secondary | ICD-10-CM | POA: Diagnosis not present

## 2016-06-06 DIAGNOSIS — I10 Essential (primary) hypertension: Secondary | ICD-10-CM

## 2016-06-06 DIAGNOSIS — I471 Supraventricular tachycardia: Secondary | ICD-10-CM

## 2016-06-06 MED ORDER — ATORVASTATIN CALCIUM 20 MG PO TABS: ORAL_TABLET | ORAL | 0 refills | Status: DC

## 2016-06-06 MED ORDER — AMLODIPINE BESYLATE 2.5 MG PO TABS: 2.5000 mg | ORAL_TABLET | Freq: Every day | ORAL | 0 refills | Status: DC

## 2016-06-06 MED ORDER — METOPROLOL SUCCINATE ER 50 MG PO TB24: 50.0000 mg | ORAL_TABLET | Freq: Every day | ORAL | 0 refills | Status: DC

## 2016-06-06 NOTE — Progress Notes (Signed)
Name: [name]   MRN: [id number]    DOB: [birth year]   Date:06/06/2016       Progress Note  Subjective  Chief Complaint  Chief Complaint  Patient presents with  . Follow-up    3 months  . Hypothyroidism  . Hypertension  . Hyperlipidemia    Hypertension  This is a chronic problem. The problem is unchanged. Pertinent negatives include no blurred vision, chest pain, headaches, palpitations or shortness of breath. Past treatments include beta blockers, calcium channel blockers, diuretics and angiotensin blockers. Hypertensive end-organ damage includes a thyroid problem. There is no history of kidney disease, CAD/MI or CVA.  Hyperlipidemia  This is a chronic problem. The problem is controlled. Recent lipid tests were reviewed and are normal. Pertinent negatives include no chest pain, leg pain, myalgias or shortness of breath. Current antihyperlipidemic treatment includes statins.  Thyroid Problem  Presents for follow-up visit. Patient reports no cold intolerance, constipation, depressed mood, fatigue, hair loss or palpitations. The symptoms have been stable. Her past medical history is significant for hyperlipidemia.     Past Medical History:  Diagnosis Date  . Bronchitis   . DJD (degenerative joint disease), cervical   . Hyperlipidemia   . Hypertension   . Hypothyroid   . LBBB (left bundle branch block)   . SVT (supraventricular tachycardia) (HCC)    a. s/p ablation by Dr Lovena Le 5/13    Past Surgical History:  Procedure Laterality Date  . ELECTROPHYSIOLOGY STUDY N/A 02/27/2012   Procedure: ELECTROPHYSIOLOGY STUDY;  Surgeon: Evans Lance, MD;  Location: [facility] CATH LAB;  Service: Cardiovascular;  Laterality: N/A;  . EPS and ablation for SVT  5/13   slow pathway ablation by Dr Lovena Le  . EYE SURGERY     surgery for detatched retina  . SUPRAVENTRICULAR TACHYCARDIA ABLATION N/A 02/27/2012   Procedure: SUPRAVENTRICULAR TACHYCARDIA ABLATION;  Surgeon: Evans Lance, MD;  Location:  [facility] CATH LAB;  Service: Cardiovascular;  Laterality: N/A;    Family History  Problem Relation Age of Onset  . Alzheimer's disease Mother   . Cancer Father   . Stroke Father   . Cancer Paternal Aunt   . Thyroid disease Maternal Grandmother     Social History   Social History  . Marital status: Married    Spouse name: N/A  . Number of children: N/A  . Years of education: N/A   Occupational History  . Not on file.   Social History Main Topics  . Smoking status: Never Smoker  . Smokeless tobacco: Not on file  . Alcohol use No  . Drug use: No  . Sexual activity: Not on file   Other Topics Concern  . Not on file   Social History Narrative   Lives in [place]     Current Outpatient Prescriptions:  .  acetaminophen (TYLENOL) 325 MG tablet, Take 650 mg by mouth every 6 (six) hours as needed. For pain, Disp: , Rfl:  .  amLODipine (NORVASC) 2.5 MG tablet, TAKE 1 TABLET BY MOUTH DAILY, Disp: 90 tablet, Rfl: 0 .  aspirin EC 81 MG tablet, Take 81 mg by mouth daily., Disp: , Rfl:  .  atorvastatin (LIPITOR) 20 MG tablet, TAKE 1 TABLET(20 MG) BY MOUTH DAILY AT 6 PM, Disp: 90 tablet, Rfl: 0 .  busPIRone (BUSPAR) 5 MG tablet, Take 1 tablet (5 mg total) by mouth 2 (two) times daily., Disp: 270 tablet, Rfl: 0 .  cholecalciferol (VITAMIN D) 1000 UNITS tablet, Take 2,000 Units by  mouth daily., Disp: , Rfl:  .  cyclobenzaprine (FLEXERIL) 10 MG tablet, TAKE ONE TABLET BY MOUTH AT BEDTIME, Disp: 90 tablet, Rfl: 0 .  levothyroxine (SYNTHROID, LEVOTHROID) 112 MCG tablet, Take 1 tablet (112 mcg total) by mouth daily before breakfast., Disp: 90 tablet, Rfl: 0 .  loratadine (CLARITIN) 10 MG tablet, Take 10 mg by mouth daily., Disp: , Rfl:  .  losartan-hydrochlorothiazide (HYZAAR) 100-12.5 MG tablet, Take 1 tablet by mouth daily., Disp: 90 tablet, Rfl: 0 .  metoprolol succinate (TOPROL-XL) 50 MG 24 hr tablet, TAKE 1 TABLET BY MOUTH EVERY DAY, Disp: 90 tablet, Rfl: 0 .  Multiple Vitamin  (MULITIVITAMIN WITH MINERALS) TABS, Take 1 tablet by mouth daily., Disp: , Rfl:  .  potassium chloride (MICRO-K) 10 MEQ CR capsule, TAKE ONE CAPSULE BY MOUTH EVERY DAY, Disp: 90 capsule, Rfl: 0  Allergies  Allergen Reactions  . Codeine Nausea And Vomiting  . Erythromycin Swelling  . Penicillins Swelling  . Septra [Sulfamethoxazole-Trimethoprim] Swelling     Review of Systems  Constitutional: Negative for chills, fatigue and fever.  Eyes: Negative for blurred vision.  Respiratory: Negative for shortness of breath.   Cardiovascular: Negative for chest pain and palpitations.  Gastrointestinal: Negative for constipation.  Musculoskeletal: Negative for myalgias.  Neurological: Negative for headaches.  Endo/Heme/Allergies: Negative for cold intolerance.    Objective  Vitals:   06/06/16 0821  BP: 138/76  Pulse: 86  Resp: 14  Temp: 97.8 F (36.6 C)  TempSrc: Oral  SpO2: 95%  Weight: 151 lb 9.6 oz (68.8 kg)    Physical Exam  Constitutional: She is well-developed, well-nourished, and in no distress.  Cardiovascular: Normal rate, regular rhythm, S1 normal, S2 normal and normal heart sounds.   No murmur heard. Pulmonary/Chest: Breath sounds normal. No respiratory distress. She has no wheezes. She has no rales.  Abdominal: Soft. Bowel sounds are normal.  Psychiatric: Mood, memory, affect and judgment normal.  Nursing note and vitals reviewed.     Assessment & Plan  1. Essential hypertension  - amLODipine (NORVASC) 2.5 MG tablet; Take 1 tablet (2.5 mg total) by mouth daily.  Dispense: 90 tablet; Refill: 0  2. Other specified hypothyroidism  - TSH  3. Hyperlipidemia  - Lipid Profile - COMPLETE METABOLIC PANEL WITH GFR - atorvastatin (LIPITOR) 20 MG tablet; TAKE 1 TABLET(20 MG) BY MOUTH DAILY AT 6 PM  Dispense: 90 tablet; Refill: 0  4. SVT (supraventricular tachycardia) (HCC)  - metoprolol succinate (TOPROL-XL) 50 MG 24 hr tablet; Take 1 tablet (50 mg total) by  mouth daily. Take with or immediately following a meal.  Dispense: 90 tablet; Refill: 0   Azrael Maddix Asad A. [facility] 06/06/2016 8:30 AM

## 2016-06-07 LAB — COMPREHENSIVE METABOLIC PANEL
ALK PHOS: 74 IU/L (ref 39–117)
ALT: 12 IU/L (ref 0–32)
AST: 18 IU/L (ref 0–40)
Albumin/Globulin Ratio: 2 (ref 1.2–2.2)
Albumin: 4.7 g/dL (ref 3.5–4.8)
BILIRUBIN TOTAL: 0.6 mg/dL (ref 0.0–1.2)
BUN / CREAT RATIO: 16 (ref 12–28)
BUN: 12 mg/dL (ref 8–27)
CHLORIDE: 99 mmol/L (ref 96–106)
CO2: 27 mmol/L (ref 18–29)
CREATININE: 0.76 mg/dL (ref 0.57–1.00)
Calcium: 9.7 mg/dL (ref 8.7–10.3)
GFR calc Af Amer: 90 mL/min/{1.73_m2} (ref >59)
GFR calc non Af Amer: 78 mL/min/{1.73_m2} (ref >59)
GLOBULIN, TOTAL: 2.4 g/dL (ref 1.5–4.5)
GLUCOSE: 116 mg/dL — AB (ref 65–99)
Potassium: 4.2 mmol/L (ref 3.5–5.2)
SODIUM: 141 mmol/L (ref 134–144)
Total Protein: 7.1 g/dL (ref 6.0–8.5)

## 2016-06-07 LAB — LIPID PANEL W/O CHOL/HDL RATIO
CHOLESTEROL TOTAL: 164 mg/dL (ref 100–199)
HDL: 54 mg/dL (ref >39)
LDL CALC: 83 mg/dL (ref 0–99)
TRIGLYCERIDES: 134 mg/dL (ref 0–149)
VLDL Cholesterol Cal: 27 mg/dL (ref 5–40)

## 2016-06-19 ENCOUNTER — Other Ambulatory Visit: Payer: Self-pay | Admitting: Family Medicine

## 2016-07-24 ENCOUNTER — Other Ambulatory Visit: Payer: Self-pay | Admitting: Family Medicine

## 2016-07-25 ENCOUNTER — Other Ambulatory Visit: Payer: Self-pay | Admitting: Family Medicine

## 2016-08-21 ENCOUNTER — Other Ambulatory Visit: Payer: Self-pay | Admitting: Family Medicine

## 2016-08-21 DIAGNOSIS — I10 Essential (primary) hypertension: Secondary | ICD-10-CM

## 2016-08-22 ENCOUNTER — Other Ambulatory Visit: Payer: Self-pay | Admitting: Family Medicine

## 2016-08-22 DIAGNOSIS — E038 Other specified hypothyroidism: Secondary | ICD-10-CM

## 2016-09-07 ENCOUNTER — Ambulatory Visit (INDEPENDENT_AMBULATORY_CARE_PROVIDER_SITE_OTHER): Payer: Medicare Other | Admitting: Family Medicine

## 2016-09-07 ENCOUNTER — Encounter: Payer: Self-pay | Admitting: Family Medicine

## 2016-09-07 VITALS — BP 122/80 | HR 79 | Temp 97.9°F | Resp 16 | Ht 67.0 in | Wt 151.8 lb

## 2016-09-07 DIAGNOSIS — E785 Hyperlipidemia, unspecified: Secondary | ICD-10-CM | POA: Diagnosis not present

## 2016-09-07 DIAGNOSIS — E038 Other specified hypothyroidism: Secondary | ICD-10-CM | POA: Diagnosis not present

## 2016-09-07 DIAGNOSIS — I10 Essential (primary) hypertension: Secondary | ICD-10-CM

## 2016-09-07 DIAGNOSIS — I471 Supraventricular tachycardia, unspecified: Secondary | ICD-10-CM

## 2016-09-07 DIAGNOSIS — Z23 Encounter for immunization: Secondary | ICD-10-CM

## 2016-09-07 MED ORDER — METOPROLOL SUCCINATE ER 50 MG PO TB24: 50.0000 mg | ORAL_TABLET | Freq: Every day | ORAL | 0 refills | Status: DC

## 2016-09-07 MED ORDER — ATORVASTATIN CALCIUM 20 MG PO TABS: ORAL_TABLET | ORAL | 0 refills | Status: DC

## 2016-09-07 MED ORDER — AMLODIPINE BESYLATE 2.5 MG PO TABS: 2.5000 mg | ORAL_TABLET | Freq: Every day | ORAL | 0 refills | Status: DC

## 2016-09-07 MED ORDER — LOSARTAN POTASSIUM-HCTZ 100-12.5 MG PO TABS: 1.0000 | ORAL_TABLET | Freq: Every day | ORAL | 0 refills | Status: DC

## 2016-09-07 MED ORDER — LEVOTHYROXINE SODIUM 112 MCG PO TABS: ORAL_TABLET | ORAL | 0 refills | Status: DC

## 2016-09-07 NOTE — Progress Notes (Signed)
Name: [name]   MRN: [id number]    DOB: 11-17-1940   Date:09/07/2016       Progress Note  Subjective  Chief Complaint  Chief Complaint  Patient presents with  . Hypertension    3 month follow up, medication refills  . Hyperlipidemia  . Hypothyroidism    Hypertension  This is a chronic problem. The problem is unchanged. The problem is controlled. Pertinent negatives include no blurred vision, chest pain, headaches, orthopnea, palpitations or shortness of breath. Past treatments include angiotensin blockers, diuretics, calcium channel blockers and beta blockers. Hypertensive end-organ damage includes a thyroid problem. There is no history of kidney disease, CAD/MI or CVA.  Hyperlipidemia  This is a chronic problem. The problem is controlled. Recent lipid tests were reviewed and are normal. Pertinent negatives include no chest pain, leg pain, myalgias or shortness of breath. Current antihyperlipidemic treatment includes statins.  Thyroid Problem  Presents for follow-up visit. Patient reports no anxiety, cold intolerance, depressed mood, heat intolerance or palpitations. The symptoms have been stable. Her past medical history is significant for hyperlipidemia.     Past Medical History:  Diagnosis Date  . Bronchitis   . DJD (degenerative joint disease), cervical   . Hyperlipidemia   . Hypertension   . Hypothyroid   . LBBB (left bundle branch block)   . SVT (supraventricular tachycardia) (HCC)    a. s/p ablation by Dr Lovena Le 5/13    Past Surgical History:  Procedure Laterality Date  . ELECTROPHYSIOLOGY STUDY N/A 02/27/2012   Procedure: ELECTROPHYSIOLOGY STUDY;  Surgeon: Evans Lance, MD;  Location: [facility] CATH LAB;  Service: Cardiovascular;  Laterality: N/A;  . EPS and ablation for SVT  5/13   slow pathway ablation by Dr Lovena Le  . EYE SURGERY     surgery for detatched retina  . SUPRAVENTRICULAR TACHYCARDIA ABLATION N/A 02/27/2012   Procedure: SUPRAVENTRICULAR TACHYCARDIA  ABLATION;  Surgeon: Evans Lance, MD;  Location: [facility] CATH LAB;  Service: Cardiovascular;  Laterality: N/A;    Family History  Problem Relation Age of Onset  . Alzheimer's disease Mother   . Cancer Father   . Stroke Father   . Cancer Paternal Aunt   . Thyroid disease Maternal Grandmother     Social History   Social History  . Marital status: Married    Spouse name: N/A  . Number of children: N/A  . Years of education: N/A   Occupational History  . Not on file.   Social History Main Topics  . Smoking status: Never Smoker  . Smokeless tobacco: Not on file  . Alcohol use No  . Drug use: No  . Sexual activity: Not on file   Other Topics Concern  . Not on file   Social History Narrative   Lives in [place]     Current Outpatient Prescriptions:  .  acetaminophen (TYLENOL) 325 MG tablet, Take 650 mg by mouth every 6 (six) hours as needed. For pain, Disp: , Rfl:  .  amLODipine (NORVASC) 2.5 MG tablet, Take 1 tablet (2.5 mg total) by mouth daily., Disp: 90 tablet, Rfl: 0 .  aspirin EC 81 MG tablet, Take 81 mg by mouth daily., Disp: , Rfl:  .  atorvastatin (LIPITOR) 20 MG tablet, TAKE 1 TABLET(20 MG) BY MOUTH DAILY AT 6 PM, Disp: 90 tablet, Rfl: 0 .  atorvastatin (LIPITOR) 20 MG tablet, TAKE 1 TABLET(20 MG) BY MOUTH DAILY AT 6 PM, Disp: 90 tablet, Rfl: 0 .  busPIRone (BUSPAR) 5 MG  tablet, Take 1 tablet (5 mg total) by mouth 2 (two) times daily., Disp: 270 tablet, Rfl: 0 .  cholecalciferol (VITAMIN D) 1000 UNITS tablet, Take 2,000 Units by mouth daily., Disp: , Rfl:  .  cyclobenzaprine (FLEXERIL) 10 MG tablet, TAKE ONE TABLET BY MOUTH AT BEDTIME, Disp: 90 tablet, Rfl: 0 .  levothyroxine (SYNTHROID, LEVOTHROID) 112 MCG tablet, TAKE 1 TABLET(112 MCG) BY MOUTH DAILY BEFORE BREAKFAST, Disp: 90 tablet, Rfl: 0 .  loratadine (CLARITIN) 10 MG tablet, Take 10 mg by mouth daily., Disp: , Rfl:  .  losartan-hydrochlorothiazide (HYZAAR) 100-12.5 MG tablet, TAKE 1 TABLET BY MOUTH DAILY,  Disp: 90 tablet, Rfl: 0 .  metoprolol succinate (TOPROL-XL) 50 MG 24 hr tablet, Take 1 tablet (50 mg total) by mouth daily. Take with or immediately following a meal., Disp: 90 tablet, Rfl: 0 .  metoprolol succinate (TOPROL-XL) 50 MG 24 hr tablet, TAKE 1 TABLET BY MOUTH EVERY DAY, Disp: 90 tablet, Rfl: 0 .  Multiple Vitamin (MULITIVITAMIN WITH MINERALS) TABS, Take 1 tablet by mouth daily., Disp: , Rfl:  .  potassium chloride (MICRO-K) 10 MEQ CR capsule, TAKE ONE CAPSULE BY MOUTH EVERY DAY, Disp: 90 capsule, Rfl: 0  Allergies  Allergen Reactions  . Codeine Nausea And Vomiting  . Erythromycin Swelling  . Penicillins Swelling  . Septra [Sulfamethoxazole-Trimethoprim] Swelling     Review of Systems  Eyes: Negative for blurred vision.  Respiratory: Negative for shortness of breath.   Cardiovascular: Negative for chest pain, palpitations and orthopnea.  Musculoskeletal: Negative for myalgias.  Neurological: Negative for headaches.  Endo/Heme/Allergies: Negative for cold intolerance and heat intolerance.  Psychiatric/Behavioral: The patient is not nervous/anxious.      Objective  Vitals:   09/07/16 0828  BP: 122/80  Pulse: 79  Resp: 16  Temp: 97.9 F (36.6 C)  TempSrc: Oral  SpO2: 98%  Weight: 151 lb 12.8 oz (68.9 kg)  Height: 5\' 7"  (1.702 m)    Physical Exam  Constitutional: She is well-developed, well-nourished, and in no distress.  HENT:  Head: Normocephalic.  Cardiovascular: Normal rate, regular rhythm, S1 normal, S2 normal and normal heart sounds.   No murmur heard. Pulmonary/Chest: Breath sounds normal. No respiratory distress. She has no wheezes. She has no rales.  Abdominal: Soft. Bowel sounds are normal.  Psychiatric: Mood, memory, affect and judgment normal.  Nursing note and vitals reviewed.    Assessment & Plan  1. Essential hypertension BP stable and controlled on present antihypertensive therapy - losartan-hydrochlorothiazide (HYZAAR) 100-12.5 MG  tablet; Take 1 tablet by mouth daily.  Dispense: 90 tablet; Refill: 0 - amLODipine (NORVASC) 2.5 MG tablet; Take 1 tablet (2.5 mg total) by mouth daily.  Dispense: 90 tablet; Refill: 0  2. Other specified hypothyroidism  - levothyroxine (SYNTHROID, LEVOTHROID) 112 MCG tablet; TAKE 1 TABLET(112 MCG) BY MOUTH DAILY BEFORE BREAKFAST  Dispense: 90 tablet; Refill: 0  3. Hyperlipidemia, unspecified hyperlipidemia type  - atorvastatin (LIPITOR) 20 MG tablet; TAKE 1 TABLET(20 MG) BY MOUTH DAILY AT 6 PM  Dispense: 90 tablet; Refill: 0  4. SVT (supraventricular tachycardia) (HCC)  - metoprolol succinate (TOPROL-XL) 50 MG 24 hr tablet; Take 1 tablet (50 mg total) by mouth daily. Take with or immediately following a meal.  Dispense: 90 tablet; Refill: 0  5. Need for pneumococcal vaccination  - Pneumococcal conjugate vaccine 13-valent   Mccayla Shimada Asad A. [facility] 09/07/2016 8:35 AM

## 2016-09-19 ENCOUNTER — Other Ambulatory Visit: Payer: Self-pay | Admitting: Family Medicine

## 2016-10-10 ENCOUNTER — Other Ambulatory Visit: Payer: Self-pay | Admitting: Family Medicine

## 2016-10-20 ENCOUNTER — Other Ambulatory Visit: Payer: Self-pay | Admitting: Family Medicine

## 2016-11-11 ENCOUNTER — Other Ambulatory Visit: Payer: Self-pay | Admitting: Family Medicine

## 2016-11-11 DIAGNOSIS — I10 Essential (primary) hypertension: Secondary | ICD-10-CM

## 2016-11-29 ENCOUNTER — Ambulatory Visit (INDEPENDENT_AMBULATORY_CARE_PROVIDER_SITE_OTHER): Payer: Medicare Other | Admitting: Family Medicine

## 2016-11-29 ENCOUNTER — Encounter: Payer: Self-pay | Admitting: Family Medicine

## 2016-11-29 VITALS — BP 124/80 | HR 87 | Temp 98.8°F | Resp 16

## 2016-11-29 DIAGNOSIS — R6889 Other general symptoms and signs: Secondary | ICD-10-CM

## 2016-11-29 DIAGNOSIS — J101 Influenza due to other identified influenza virus with other respiratory manifestations: Secondary | ICD-10-CM | POA: Insufficient documentation

## 2016-11-29 LAB — POCT INFLUENZA A/B
INFLUENZA A, POC: POSITIVE — AB
Influenza B, POC: NEGATIVE

## 2016-11-29 MED ORDER — OSELTAMIVIR PHOSPHATE 75 MG PO CAPS: 75.0000 mg | ORAL_CAPSULE | Freq: Two times a day (BID) | ORAL | 0 refills | Status: DC

## 2016-11-29 NOTE — Progress Notes (Signed)
Name: [name]   MRN: [id number]    DOB: 08-05-1941   Date:11/29/2016       Progress Note  Subjective  Chief Complaint  Chief Complaint  Patient presents with  . Nausea    Seen by EMS this morning. Patient is nauseated, vomiting, congested, sorethroat, dizzy    Sore Throat   This is a new problem. The current episode started yesterday. The problem has been unchanged. There has been no fever. Associated symptoms include coughing, shortness of breath and vomiting. Treatments tried: Coricidin HBP. The treatment provided mild relief.     Past Medical History:  Diagnosis Date  . Bronchitis   . DJD (degenerative joint disease), cervical   . Hyperlipidemia   . Hypertension   . Hypothyroid   . LBBB (left bundle branch block)   . SVT (supraventricular tachycardia) (HCC)    a. s/p ablation by Dr Lovena Le 5/13    Past Surgical History:  Procedure Laterality Date  . ELECTROPHYSIOLOGY STUDY N/A 02/27/2012   Procedure: ELECTROPHYSIOLOGY STUDY;  Surgeon: Evans Lance, MD;  Location: [facility] CATH LAB;  Service: Cardiovascular;  Laterality: N/A;  . EPS and ablation for SVT  5/13   slow pathway ablation by Dr Lovena Le  . EYE SURGERY     surgery for detatched retina  . SUPRAVENTRICULAR TACHYCARDIA ABLATION N/A 02/27/2012   Procedure: SUPRAVENTRICULAR TACHYCARDIA ABLATION;  Surgeon: Evans Lance, MD;  Location: [facility] CATH LAB;  Service: Cardiovascular;  Laterality: N/A;    Family History  Problem Relation Age of Onset  . Alzheimer's disease Mother   . Cancer Father   . Stroke Father   . Cancer Paternal Aunt   . Thyroid disease Maternal Grandmother     Social History   Social History  . Marital status: Married    Spouse name: N/A  . Number of children: N/A  . Years of education: N/A   Occupational History  . Not on file.   Social History Main Topics  . Smoking status: Never Smoker  . Smokeless tobacco: Never Used  . Alcohol use No  . Drug use: No  . Sexual activity: Not on file    Other Topics Concern  . Not on file   Social History Narrative   Lives in [place]     Current Outpatient Prescriptions:  .  acetaminophen (TYLENOL) 325 MG tablet, Take 650 mg by mouth every 6 (six) hours as needed. For pain, Disp: , Rfl:  .  amLODipine (NORVASC) 2.5 MG tablet, Take 1 tablet (2.5 mg total) by mouth daily., Disp: 90 tablet, Rfl: 0 .  amLODipine (NORVASC) 2.5 MG tablet, TAKE 1 TABLET(2.5 MG) BY MOUTH DAILY, Disp: 90 tablet, Rfl: 0 .  aspirin EC 81 MG tablet, Take 81 mg by mouth daily., Disp: , Rfl:  .  atorvastatin (LIPITOR) 20 MG tablet, TAKE 1 TABLET(20 MG) BY MOUTH DAILY AT 6 PM, Disp: 90 tablet, Rfl: 0 .  busPIRone (BUSPAR) 5 MG tablet, TAKE 1 TABLET BY MOUTH TWICE DAILY, Disp: 270 tablet, Rfl: 0 .  cholecalciferol (VITAMIN D) 1000 UNITS tablet, Take 2,000 Units by mouth daily., Disp: , Rfl:  .  cyclobenzaprine (FLEXERIL) 10 MG tablet, TAKE ONE TABLET BY MOUTH AT BEDTIME, Disp: 90 tablet, Rfl: 0 .  levothyroxine (SYNTHROID, LEVOTHROID) 112 MCG tablet, TAKE 1 TABLET(112 MCG) BY MOUTH DAILY BEFORE BREAKFAST, Disp: 90 tablet, Rfl: 0 .  loratadine (CLARITIN) 10 MG tablet, Take 10 mg by mouth daily., Disp: , Rfl:  .  losartan-hydrochlorothiazide (  HYZAAR) 100-12.5 MG tablet, Take 1 tablet by mouth daily., Disp: 90 tablet, Rfl: 0 .  metoprolol succinate (TOPROL-XL) 50 MG 24 hr tablet, Take 1 tablet (50 mg total) by mouth daily. Take with or immediately following a meal., Disp: 90 tablet, Rfl: 0 .  Multiple Vitamin (MULITIVITAMIN WITH MINERALS) TABS, Take 1 tablet by mouth daily., Disp: , Rfl:  .  potassium chloride (MICRO-K) 10 MEQ CR capsule, TAKE ONE CAPSULE BY MOUTH EVERY DAY, Disp: 90 capsule, Rfl: 0  Allergies  Allergen Reactions  . Codeine Nausea And Vomiting  . Erythromycin Swelling  . Penicillins Swelling  . Septra [Sulfamethoxazole-Trimethoprim] Swelling     Review of Systems  Constitutional: Positive for fever.  Respiratory: Positive for cough and  shortness of breath.   Gastrointestinal: Positive for nausea and vomiting.     Objective  Vitals:   11/29/16 1004  BP: 124/80  Pulse: 87  Resp: 16  Temp: 98.8 F (37.1 C)  TempSrc: Oral  SpO2: 95%    Physical Exam  Constitutional: She is oriented to person, place, and time and well-developed, well-nourished, and in no distress.  HENT:  Head: Normocephalic and atraumatic.  Mouth/Throat: Posterior oropharyngeal erythema present.  Cardiovascular: Normal rate, regular rhythm, S1 normal, S2 normal and normal heart sounds.   No murmur heard. Pulmonary/Chest: Effort normal and breath sounds normal. She has no wheezes.  Abdominal: Soft. Bowel sounds are normal. There is tenderness.  Neurological: She is alert and oriented to person, place, and time.  Psychiatric: Mood, memory, affect and judgment normal.  Nursing note and vitals reviewed.      Recent Results (from the past 2160 hour(s))  POCT Influenza A/B     Status: Abnormal   Collection Time: 11/29/16 10:29 AM  Result Value Ref Range   Influenza A, POC Positive (A) Negative   Influenza B, POC Negative Negative     Assessment & Plan  1. Flu-like symptoms Point-of-care testing for Influenza is positive for Flu A.  - POCT Influenza A/B  2. Influenza A Start on Tamiflu for treatment. Advised on symtomatic measures for relief. - oseltamivir (TAMIFLU) 75 MG capsule; Take 1 capsule (75 mg total) by mouth 2 (two) times daily.  Dispense: 20 capsule; Refill: 0   Phylis Javed Asad A. [facility] 11/29/2016 10:34 AM

## 2016-12-08 ENCOUNTER — Encounter: Payer: Self-pay | Admitting: Family Medicine

## 2016-12-08 ENCOUNTER — Ambulatory Visit (INDEPENDENT_AMBULATORY_CARE_PROVIDER_SITE_OTHER): Payer: Medicare Other | Admitting: Family Medicine

## 2016-12-08 VITALS — BP 126/80 | HR 77 | Temp 97.4°F | Resp 16 | Ht 67.0 in | Wt 155.5 lb

## 2016-12-08 DIAGNOSIS — M47812 Spondylosis without myelopathy or radiculopathy, cervical region: Secondary | ICD-10-CM

## 2016-12-08 DIAGNOSIS — I471 Supraventricular tachycardia: Secondary | ICD-10-CM

## 2016-12-08 DIAGNOSIS — E038 Other specified hypothyroidism: Secondary | ICD-10-CM

## 2016-12-08 DIAGNOSIS — E785 Hyperlipidemia, unspecified: Secondary | ICD-10-CM | POA: Diagnosis not present

## 2016-12-08 DIAGNOSIS — I10 Essential (primary) hypertension: Secondary | ICD-10-CM | POA: Diagnosis not present

## 2016-12-08 LAB — COMPLETE METABOLIC PANEL WITH GFR
ALT: 10 U/L (ref 6–29)
AST: 15 U/L (ref 10–35)
Albumin: 4.2 g/dL (ref 3.6–5.1)
Alkaline Phosphatase: 75 U/L (ref 33–130)
BUN: 14 mg/dL (ref 7–25)
CALCIUM: 9.7 mg/dL (ref 8.6–10.4)
CHLORIDE: 100 mmol/L (ref 98–110)
CO2: 24 mmol/L (ref 20–31)
Creat: 0.79 mg/dL (ref 0.60–0.93)
GFR, EST AFRICAN AMERICAN: 85 mL/min (ref >=60)
GFR, EST NON AFRICAN AMERICAN: 74 mL/min (ref >=60)
Glucose, Bld: 116 mg/dL — ABNORMAL HIGH (ref 65–99)
POTASSIUM: 4.6 mmol/L (ref 3.5–5.3)
Sodium: 139 mmol/L (ref 135–146)
Total Bilirubin: 0.5 mg/dL (ref 0.2–1.2)
Total Protein: 7.2 g/dL (ref 6.1–8.1)

## 2016-12-08 LAB — LIPID PANEL
CHOL/HDL RATIO: 3.4 ratio (ref <5.0)
CHOLESTEROL: 161 mg/dL (ref <200)
HDL: 47 mg/dL — ABNORMAL LOW (ref >50)
LDL Cholesterol: 83 mg/dL (ref <100)
TRIGLYCERIDES: 153 mg/dL — AB (ref <150)
VLDL: 31 mg/dL — AB (ref <30)

## 2016-12-08 LAB — TSH: TSH: 7.02 mIU/L — ABNORMAL HIGH

## 2016-12-08 MED ORDER — POTASSIUM CHLORIDE ER 10 MEQ PO CPCR: 10.0000 meq | ORAL_CAPSULE | Freq: Every day | ORAL | 1 refills | Status: DC

## 2016-12-08 MED ORDER — AMLODIPINE BESYLATE 2.5 MG PO TABS: ORAL_TABLET | ORAL | 0 refills | Status: DC

## 2016-12-08 MED ORDER — METOPROLOL SUCCINATE ER 50 MG PO TB24: 50.0000 mg | ORAL_TABLET | Freq: Every day | ORAL | 1 refills | Status: DC

## 2016-12-08 MED ORDER — CYCLOBENZAPRINE HCL 10 MG PO TABS: 10.0000 mg | ORAL_TABLET | Freq: Every day | ORAL | 0 refills | Status: DC

## 2016-12-08 MED ORDER — LEVOTHYROXINE SODIUM 112 MCG PO TABS: ORAL_TABLET | ORAL | 1 refills | Status: DC

## 2016-12-08 MED ORDER — LOSARTAN POTASSIUM-HCTZ 100-12.5 MG PO TABS: 1.0000 | ORAL_TABLET | Freq: Every day | ORAL | 0 refills | Status: DC

## 2016-12-08 MED ORDER — ATORVASTATIN CALCIUM 20 MG PO TABS: ORAL_TABLET | ORAL | 0 refills | Status: DC

## 2016-12-08 NOTE — Progress Notes (Signed)
Name: [name]   MRN: [id number]    DOB: 12-11-1940   Date:12/08/2016       Progress Note  Subjective  Chief Complaint  Chief Complaint  Patient presents with  . Hypertension     follow up  . Hypothyroidism  . Hyperlipidemia  . Medication Refill    Hypertension  This is a chronic problem. The problem is unchanged. The problem is controlled. Associated symptoms include palpitations (occasional palpitations, hx of ablation, takes Metoprolol). Pertinent negatives include no blurred vision, chest pain, headaches or shortness of breath. Past treatments include beta blockers, angiotensin blockers and diuretics. There is no history of kidney disease, CAD/MI or CVA. Identifiable causes of hypertension include a thyroid problem.  Hyperlipidemia  This is a chronic problem. The problem is controlled. Recent lipid tests were reviewed and are normal. Pertinent negatives include no chest pain, leg pain, myalgias or shortness of breath. Current antihyperlipidemic treatment includes statins.  Thyroid Problem  Presents for follow-up visit. Symptoms include palpitations (occasional palpitations, hx of ablation, takes Metoprolol). Patient reports no anxiety, cold intolerance, constipation, depressed mood, dry skin or fatigue. Her past medical history is significant for hyperlipidemia.     Past Medical History:  Diagnosis Date  . Bronchitis   . DJD (degenerative joint disease), cervical   . Hyperlipidemia   . Hypertension   . Hypothyroid   . LBBB (left bundle branch block)   . SVT (supraventricular tachycardia) (HCC)    a. s/p ablation by Dr Lovena Le 5/13    Past Surgical History:  Procedure Laterality Date  . ELECTROPHYSIOLOGY STUDY N/A 02/27/2012   Procedure: ELECTROPHYSIOLOGY STUDY;  Surgeon: Evans Lance, MD;  Location: [facility] CATH LAB;  Service: Cardiovascular;  Laterality: N/A;  . EPS and ablation for SVT  5/13   slow pathway ablation by Dr Lovena Le  . EYE SURGERY     surgery for detatched  retina  . SUPRAVENTRICULAR TACHYCARDIA ABLATION N/A 02/27/2012   Procedure: SUPRAVENTRICULAR TACHYCARDIA ABLATION;  Surgeon: Evans Lance, MD;  Location: [facility] CATH LAB;  Service: Cardiovascular;  Laterality: N/A;    Family History  Problem Relation Age of Onset  . Alzheimer's disease Mother   . Cancer Father   . Stroke Father   . Cancer Paternal Aunt   . Thyroid disease Maternal Grandmother     Social History   Social History  . Marital status: Married    Spouse name: N/A  . Number of children: N/A  . Years of education: N/A   Occupational History  . Not on file.   Social History Main Topics  . Smoking status: Never Smoker  . Smokeless tobacco: Never Used  . Alcohol use No  . Drug use: No  . Sexual activity: Not on file   Other Topics Concern  . Not on file   Social History Narrative   Lives in [place]     Current Outpatient Prescriptions:  .  acetaminophen (TYLENOL) 325 MG tablet, Take 650 mg by mouth every 6 (six) hours as needed. For pain, Disp: , Rfl:  .  amLODipine (NORVASC) 2.5 MG tablet, TAKE 1 TABLET(2.5 MG) BY MOUTH DAILY, Disp: 90 tablet, Rfl: 0 .  aspirin EC 81 MG tablet, Take 81 mg by mouth daily., Disp: , Rfl:  .  atorvastatin (LIPITOR) 20 MG tablet, TAKE 1 TABLET(20 MG) BY MOUTH DAILY AT 6 PM, Disp: 90 tablet, Rfl: 0 .  busPIRone (BUSPAR) 5 MG tablet, TAKE 1 TABLET BY MOUTH TWICE DAILY, Disp: 270 tablet,  Rfl: 0 .  cholecalciferol (VITAMIN D) 1000 UNITS tablet, Take 2,000 Units by mouth daily., Disp: , Rfl:  .  cyclobenzaprine (FLEXERIL) 10 MG tablet, TAKE ONE TABLET BY MOUTH AT BEDTIME, Disp: 90 tablet, Rfl: 0 .  levothyroxine (SYNTHROID, LEVOTHROID) 112 MCG tablet, TAKE 1 TABLET(112 MCG) BY MOUTH DAILY BEFORE BREAKFAST, Disp: 90 tablet, Rfl: 0 .  loratadine (CLARITIN) 10 MG tablet, Take 10 mg by mouth daily., Disp: , Rfl:  .  losartan-hydrochlorothiazide (HYZAAR) 100-12.5 MG tablet, Take 1 tablet by mouth daily., Disp: 90 tablet, Rfl: 0 .  metoprolol  succinate (TOPROL-XL) 50 MG 24 hr tablet, Take 1 tablet (50 mg total) by mouth daily. Take with or immediately following a meal., Disp: 90 tablet, Rfl: 0 .  Multiple Vitamin (MULITIVITAMIN WITH MINERALS) TABS, Take 1 tablet by mouth daily., Disp: , Rfl:  .  potassium chloride (MICRO-K) 10 MEQ CR capsule, TAKE ONE CAPSULE BY MOUTH EVERY DAY, Disp: 90 capsule, Rfl: 0  Allergies  Allergen Reactions  . Codeine Nausea And Vomiting  . Erythromycin Swelling  . Penicillins Swelling  . Septra [Sulfamethoxazole-Trimethoprim] Swelling     Review of Systems  Constitutional: Negative for fatigue.  Eyes: Negative for blurred vision.  Respiratory: Negative for shortness of breath.   Cardiovascular: Positive for palpitations (occasional palpitations, hx of ablation, takes Metoprolol). Negative for chest pain.  Gastrointestinal: Negative for constipation.  Musculoskeletal: Negative for myalgias.  Neurological: Negative for headaches.  Endo/Heme/Allergies: Negative for cold intolerance.  Psychiatric/Behavioral: The patient is not nervous/anxious.     Objective  Vitals:   12/08/16 0901  BP: 126/80  Pulse: 77  Resp: 16  Temp: 97.4 F (36.3 C)  TempSrc: Oral  SpO2: 98%  Weight: 155 lb 8 oz (70.5 kg)  Height: 5\' 7"  (1.702 m)    Physical Exam  Constitutional: She is oriented to person, place, and time and well-developed, well-nourished, and in no distress.  HENT:  Head: Normocephalic and atraumatic.  Neck: Neck supple. No thyromegaly present.  Cardiovascular: Normal rate, regular rhythm, S1 normal, S2 normal and normal heart sounds.   No murmur heard. Pulmonary/Chest: Effort normal and breath sounds normal. No respiratory distress. She has no wheezes. She has no rales.  Abdominal: Soft. Bowel sounds are normal. There is no tenderness.  Musculoskeletal: She exhibits no edema.  Neurological: She is alert and oriented to person, place, and time.  Psychiatric: Mood, memory, affect and  judgment normal.  Nursing note and vitals reviewed.   Assessment & Plan  1. Essential hypertension BP stable on present antihypertensive therapy - amLODipine (NORVASC) 2.5 MG tablet; TAKE 1 TABLET(2.5 MG) BY MOUTH DAILY  Dispense: 90 tablet; Refill: 0 - losartan-hydrochlorothiazide (HYZAAR) 100-12.5 MG tablet; Take 1 tablet by mouth daily.  Dispense: 90 tablet; Refill: 0 - potassium chloride (MICRO-K) 10 MEQ CR capsule; Take 1 capsule (10 mEq total) by mouth daily.  Dispense: 90 capsule; Refill: 1  2. Hyperlipidemia, unspecified hyperlipidemia type Obtain FLP, and just statin as necessary - atorvastatin (LIPITOR) 20 MG tablet; TAKE 1 TABLET(20 MG) BY MOUTH DAILY AT 6 PM  Dispense: 90 tablet; Refill: 0 - Lipid panel - COMPLETE METABOLIC PANEL WITH GFR  3. Other specified hypothyroidism  - levothyroxine (SYNTHROID, LEVOTHROID) 112 MCG tablet; TAKE 1 TABLET(112 MCG) BY MOUTH DAILY BEFORE BREAKFAST  Dispense: 90 tablet; Refill: 1 - TSH  4. SVT (supraventricular tachycardia) (HCC)  - metoprolol succinate (TOPROL-XL) 50 MG 24 hr tablet; Take 1 tablet (50 mg total) by mouth daily. Take with  or immediately following a meal.  Dispense: 90 tablet; Refill: 1  5. Spondylosis of cervical region without myelopathy or radiculopathy Takes cyclobenzaprine at bedtime for relief of muscle spasm in the cervical spine. - cyclobenzaprine (FLEXERIL) 10 MG tablet; Take 1 tablet (10 mg total) by mouth at bedtime.  Dispense: 90 tablet; Refill: 0   Kizzy Olafson Asad A. [facility] 12/08/2016 9:10 AM

## 2016-12-13 ENCOUNTER — Other Ambulatory Visit: Payer: Self-pay | Admitting: Emergency Medicine

## 2016-12-13 MED ORDER — LEVOTHYROXINE SODIUM 125 MCG PO TABS: 125.0000 ug | ORAL_TABLET | Freq: Every day | ORAL | 0 refills | Status: DC

## 2016-12-13 NOTE — Progress Notes (Unsigned)
synthroid °

## 2016-12-18 DIAGNOSIS — H25041 Posterior subcapsular polar age-related cataract, right eye: Secondary | ICD-10-CM | POA: Diagnosis not present

## 2017-01-09 ENCOUNTER — Other Ambulatory Visit: Payer: Self-pay | Admitting: Family Medicine

## 2017-01-10 ENCOUNTER — Other Ambulatory Visit: Payer: Self-pay

## 2017-01-10 DIAGNOSIS — E038 Other specified hypothyroidism: Secondary | ICD-10-CM | POA: Diagnosis not present

## 2017-01-10 LAB — T4, FREE: Free T4: 1.2 ng/dL (ref 0.8–1.8)

## 2017-01-10 LAB — TSH: TSH: 6.02 mIU/L — ABNORMAL HIGH

## 2017-01-12 ENCOUNTER — Telehealth: Payer: Self-pay

## 2017-01-12 MED ORDER — LEVOTHYROXINE SODIUM 137 MCG PO TABS: 137.0000 ug | ORAL_TABLET | ORAL | 0 refills | Status: DC

## 2017-01-12 NOTE — Telephone Encounter (Signed)
Patient has been notified of lab results and a new prescription of Levothyroxine 137 mcg #90 take by mouth every morning has been sent to [facility] per Dr. Manuella Ghazi, patient has been notified and verbalized understanding

## 2017-01-16 ENCOUNTER — Telehealth: Payer: Self-pay | Admitting: Family Medicine

## 2017-01-16 NOTE — Telephone Encounter (Signed)
Pt can no longer afford the potassium medication she has been on due to the change in the tier. Her insurance gave her 2 other names that are Tier 1 medications, they are [place] or [place]. Pt is asking to be switched to either one.

## 2017-01-19 NOTE — Telephone Encounter (Signed)
I am not familiar within either one of these formulations for potassium supplementation, please request information including the change in tier and the alternatives in writing from the insurance company. We'll be happy to review it once received

## 2017-01-22 ENCOUNTER — Telehealth: Payer: Self-pay | Admitting: Emergency Medicine

## 2017-01-22 NOTE — Telephone Encounter (Signed)
Pt will contact insurance company to fax all information

## 2017-01-22 NOTE — Telephone Encounter (Signed)
Spoke to [place] at [facility]. Info will be sent

## 2017-01-22 NOTE — Telephone Encounter (Signed)
[name] from [facility] called on behalf of [name]. The Potassium Chloride called in is  on a higher tier. Potassium chloride Micro is on tier 1. Can it be changed

## 2017-01-24 NOTE — Telephone Encounter (Signed)
Patient is taking potassium chloride Micro-K 10 mEq, please review patient's medication refill history from February 2018. If this is approved, it may be refilled and sent to patient's pharmacy

## 2017-01-26 NOTE — Telephone Encounter (Signed)
Patient stated that Potassium was 68.00 and she could not afford. I called [facility] and got them to run Potassium Micro K and it was $29.68. Patient was notified and stated she could afford that better

## 2017-02-28 ENCOUNTER — Other Ambulatory Visit: Payer: Self-pay | Admitting: Family Medicine

## 2017-03-05 ENCOUNTER — Ambulatory Visit: Payer: Medicare Other

## 2017-03-05 ENCOUNTER — Ambulatory Visit: Payer: Medicare Other | Admitting: Family Medicine

## 2017-03-12 ENCOUNTER — Ambulatory Visit (INDEPENDENT_AMBULATORY_CARE_PROVIDER_SITE_OTHER): Payer: Medicare Other | Admitting: Family Medicine

## 2017-03-12 ENCOUNTER — Ambulatory Visit (INDEPENDENT_AMBULATORY_CARE_PROVIDER_SITE_OTHER): Payer: Medicare Other

## 2017-03-12 VITALS — BP 144/66 | HR 72 | Temp 97.6°F | Ht 67.0 in | Wt 154.8 lb

## 2017-03-12 VITALS — BP 144/76 | HR 72 | Temp 97.6°F | Resp 16 | Ht 67.0 in | Wt 154.8 lb

## 2017-03-12 DIAGNOSIS — E785 Hyperlipidemia, unspecified: Secondary | ICD-10-CM

## 2017-03-12 DIAGNOSIS — Z0000 Encounter for general adult medical examination without abnormal findings: Secondary | ICD-10-CM

## 2017-03-12 DIAGNOSIS — E039 Hypothyroidism, unspecified: Secondary | ICD-10-CM

## 2017-03-12 DIAGNOSIS — I10 Essential (primary) hypertension: Secondary | ICD-10-CM

## 2017-03-12 MED ORDER — AMLODIPINE BESYLATE 2.5 MG PO TABS: ORAL_TABLET | ORAL | 0 refills | Status: DC

## 2017-03-12 MED ORDER — LOSARTAN POTASSIUM-HCTZ 100-12.5 MG PO TABS: 1.0000 | ORAL_TABLET | Freq: Every day | ORAL | 0 refills | Status: DC

## 2017-03-12 MED ORDER — ATORVASTATIN CALCIUM 20 MG PO TABS: ORAL_TABLET | ORAL | 0 refills | Status: DC

## 2017-03-12 NOTE — Progress Notes (Signed)
Name: [name]   MRN: [id number]    DOB: Oct 06, 1941   Date:03/13/2017       Progress Note  Subjective  Chief Complaint  No chief complaint on file.   Hypertension  This is a chronic problem. The problem is unchanged. The problem is uncontrolled (has ben a little nervous today, which may be the reason her BP is up). Pertinent negatives include no blurred vision, chest pain, headaches, orthopnea, palpitations or shortness of breath. Past treatments include angiotensin blockers, calcium channel blockers, diuretics and beta blockers. There is no history of kidney disease, CAD/MI or CVA. Identifiable causes of hypertension include a thyroid problem.  Hyperlipidemia  This is a chronic problem. The problem is uncontrolled. Recent lipid tests were reviewed and are high. Pertinent negatives include no chest pain, leg pain, myalgias or shortness of breath. Current antihyperlipidemic treatment includes statins.  Thyroid Problem  Presents for follow-up visit. Patient reports no cold intolerance (hands feel cold, sometimes turn red), constipation, depressed mood, dry skin, fatigue or palpitations. Symptom course: TSH was elevate 2 months ago, Levothyroxine was increased to 137 mcg daily, repeat levels today. Her past medical history is significant for hyperlipidemia.     Past Medical History:  Diagnosis Date  . Bronchitis   . DJD (degenerative joint disease), cervical   . Hyperlipidemia   . Hypertension   . Hypothyroid   . LBBB (left bundle branch block)   . SVT (supraventricular tachycardia) (HCC)    a. s/p ablation by Dr Lovena Le 5/13    Past Surgical History:  Procedure Laterality Date  . ELECTROPHYSIOLOGY STUDY N/A 02/27/2012   Procedure: ELECTROPHYSIOLOGY STUDY;  Surgeon: Evans Lance, MD;  Location: [facility] CATH LAB;  Service: Cardiovascular;  Laterality: N/A;  . EPS and ablation for SVT  5/13   slow pathway ablation by Dr Lovena Le  . EYE SURGERY     surgery for detatched retina  .  SUPRAVENTRICULAR TACHYCARDIA ABLATION N/A 02/27/2012   Procedure: SUPRAVENTRICULAR TACHYCARDIA ABLATION;  Surgeon: Evans Lance, MD;  Location: [facility] CATH LAB;  Service: Cardiovascular;  Laterality: N/A;    Family History  Problem Relation Age of Onset  . Alzheimer's disease Mother   . Cancer Father   . Stroke Father   . Cancer Paternal Aunt   . Thyroid disease Maternal Grandmother     Social History   Social History  . Marital status: Married    Spouse name: N/A  . Number of children: N/A  . Years of education: N/A   Occupational History  . Not on file.   Social History Main Topics  . Smoking status: Never Smoker  . Smokeless tobacco: Never Used  . Alcohol use No  . Drug use: No  . Sexual activity: Not on file   Other Topics Concern  . Not on file   Social History Narrative   Lives in [place]     Current Outpatient Prescriptions:  .  acetaminophen (TYLENOL) 325 MG tablet, Take 650 mg by mouth every 6 (six) hours as needed. For pain, Disp: , Rfl:  .  amLODipine (NORVASC) 2.5 MG tablet, TAKE 1 TABLET(2.5 MG) BY MOUTH DAILY, Disp: 90 tablet, Rfl: 0 .  aspirin EC 81 MG tablet, Take 162 mg by mouth daily. , Disp: , Rfl:  .  atorvastatin (LIPITOR) 20 MG tablet, TAKE 1 TABLET(20 MG) BY MOUTH DAILY AT 6 PM, Disp: 90 tablet, Rfl: 0 .  busPIRone (BUSPAR) 5 MG tablet, TAKE 1 TABLET BY MOUTH TWICE DAILY,  Disp: 270 tablet, Rfl: 0 .  cholecalciferol (VITAMIN D) 1000 UNITS tablet, Take 2,000 Units by mouth daily., Disp: , Rfl:  .  cyclobenzaprine (FLEXERIL) 10 MG tablet, Take 1 tablet (10 mg total) by mouth at bedtime., Disp: 90 tablet, Rfl: 0 .  levothyroxine (SYNTHROID, LEVOTHROID) 137 MCG tablet, Take 1 tablet (137 mcg total) by mouth every morning., Disp: 90 tablet, Rfl: 0 .  loratadine (CLARITIN) 10 MG tablet, Take 10 mg by mouth daily., Disp: , Rfl:  .  losartan-hydrochlorothiazide (HYZAAR) 100-12.5 MG tablet, Take 1 tablet by mouth daily., Disp: 90 tablet, Rfl: 0 .   metoprolol succinate (TOPROL-XL) 50 MG 24 hr tablet, Take 1 tablet (50 mg total) by mouth daily. Take with or immediately following a meal., Disp: 90 tablet, Rfl: 1 .  Multiple Vitamin (MULITIVITAMIN WITH MINERALS) TABS, Take 1 tablet by mouth daily., Disp: , Rfl:  .  potassium chloride (MICRO-K) 10 MEQ CR capsule, Take 1 capsule (10 mEq total) by mouth daily., Disp: 90 capsule, Rfl: 1  Allergies  Allergen Reactions  . Codeine Nausea And Vomiting  . Erythromycin Swelling  . Penicillins Swelling  . Septra [Sulfamethoxazole-Trimethoprim] Swelling     Review of Systems  Constitutional: Negative for fatigue.  Eyes: Negative for blurred vision.  Respiratory: Negative for shortness of breath.   Cardiovascular: Negative for chest pain, palpitations and orthopnea.  Gastrointestinal: Negative for constipation.  Musculoskeletal: Negative for myalgias.  Neurological: Negative for headaches.  Endo/Heme/Allergies: Negative for cold intolerance (hands feel cold, sometimes turn red).    Objective  Vitals:   03/12/17 1400  BP: (!) 144/76  Pulse: 72  Resp: 16  Temp: 97.6 F (36.4 C)  Weight: 154 lb 12.8 oz (70.2 kg)  Height: 5\' 7"  (1.702 m)    Physical Exam  Constitutional: She is well-developed, well-nourished, and in no distress.  HENT:  Head: Normocephalic and atraumatic.  Neck: No thyromegaly present.  Cardiovascular: Normal rate, regular rhythm and normal heart sounds.   No murmur heard. Pulmonary/Chest: Effort normal and breath sounds normal. She has no wheezes.  Abdominal: Soft. Bowel sounds are normal. There is no tenderness.  Musculoskeletal: She exhibits edema (trace pitting edema).  Skin: Skin is warm and dry.  Psychiatric: Mood, memory, affect and judgment normal.  Nursing note and vitals reviewed.    Assessment & Plan  1. Essential hypertension BP Elevated, likely because of anxiety, no change in therapy, reassess in 4 weeks - losartan-hydrochlorothiazide (HYZAAR)  100-12.5 MG tablet; Take 1 tablet by mouth daily.  Dispense: 90 tablet; Refill: 0 - amLODipine (NORVASC) 2.5 MG tablet; TAKE 1 TABLET(2.5 MG) BY MOUTH DAILY  Dispense: 90 tablet; Refill: 0  2. Hyperlipidemia, unspecified hyperlipidemia type  - Lipid panel - atorvastatin (LIPITOR) 20 MG tablet; TAKE 1 TABLET(20 MG) BY MOUTH DAILY AT 6 PM  Dispense: 90 tablet; Refill: 0  3. Hypothyroidism, unspecified type  - TSH   Keyleigh Manninen Asad A. [facility] 03/13/2017 3:40 PM

## 2017-03-12 NOTE — Progress Notes (Signed)
Subjective:   [name] is a 76 y.o. female who presents for an Initial Medicare Annual Wellness Visit.  Review of Systems    N/A  Cardiac Risk Factors include: advanced age (>32men, >83 women);dyslipidemia;hypertension     Objective:    Today's Vitals   03/12/17 1345 03/12/17 1352  BP: (!) 144/66   Pulse: 72   Temp: 97.6 F (36.4 C)   TempSrc: Oral   Weight: 154 lb 12.8 oz (70.2 kg)   Height: 5\' 7"  (1.702 m)   PainSc: 4  4   PainLoc: Neck    Body mass index is 24.25 kg/m.   Current Medications (verified) Outpatient Encounter Prescriptions as of 03/12/2017  Medication Sig  . acetaminophen (TYLENOL) 325 MG tablet Take 650 mg by mouth every 6 (six) hours as needed. For pain  . amLODipine (NORVASC) 2.5 MG tablet TAKE 1 TABLET(2.5 MG) BY MOUTH DAILY  . aspirin EC 81 MG tablet Take 162 mg by mouth daily.   Marland Kitchen atorvastatin (LIPITOR) 20 MG tablet TAKE 1 TABLET(20 MG) BY MOUTH DAILY AT 6 PM  . busPIRone (BUSPAR) 5 MG tablet TAKE 1 TABLET BY MOUTH TWICE DAILY  . cholecalciferol (VITAMIN D) 1000 UNITS tablet Take 2,000 Units by mouth daily.  . cyclobenzaprine (FLEXERIL) 10 MG tablet Take 1 tablet (10 mg total) by mouth at bedtime.  Marland Kitchen levothyroxine (SYNTHROID, LEVOTHROID) 137 MCG tablet Take 1 tablet (137 mcg total) by mouth every morning.  . loratadine (CLARITIN) 10 MG tablet Take 10 mg by mouth daily.  Marland Kitchen losartan-hydrochlorothiazide (HYZAAR) 100-12.5 MG tablet Take 1 tablet by mouth daily.  . metoprolol succinate (TOPROL-XL) 50 MG 24 hr tablet Take 1 tablet (50 mg total) by mouth daily. Take with or immediately following a meal.  . Multiple Vitamin (MULITIVITAMIN WITH MINERALS) TABS Take 1 tablet by mouth daily.  . potassium chloride (MICRO-K) 10 MEQ CR capsule Take 1 capsule (10 mEq total) by mouth daily.   No facility-administered encounter medications on file as of 03/12/2017.     Allergies (verified) Codeine; Erythromycin; Penicillins; and Septra  [sulfamethoxazole-trimethoprim]   History: Past Medical History:  Diagnosis Date  . Bronchitis   . DJD (degenerative joint disease), cervical   . Hyperlipidemia   . Hypertension   . Hypothyroid   . LBBB (left bundle branch block)   . SVT (supraventricular tachycardia) (HCC)    a. s/p ablation by Dr Lovena Le 5/13   Past Surgical History:  Procedure Laterality Date  . ELECTROPHYSIOLOGY STUDY N/A 02/27/2012   Procedure: ELECTROPHYSIOLOGY STUDY;  Surgeon: Evans Lance, MD;  Location: [facility] CATH LAB;  Service: Cardiovascular;  Laterality: N/A;  . EPS and ablation for SVT  5/13   slow pathway ablation by Dr Lovena Le  . EYE SURGERY     surgery for detatched retina  . SUPRAVENTRICULAR TACHYCARDIA ABLATION N/A 02/27/2012   Procedure: SUPRAVENTRICULAR TACHYCARDIA ABLATION;  Surgeon: Evans Lance, MD;  Location: [facility] CATH LAB;  Service: Cardiovascular;  Laterality: N/A;   Family History  Problem Relation Age of Onset  . Alzheimer's disease Mother   . Cancer Father   . Stroke Father   . Cancer Paternal Aunt   . Thyroid disease Maternal Grandmother    Social History   Occupational History  . Not on file.   Social History Main Topics  . Smoking status: Never Smoker  . Smokeless tobacco: Never Used  . Alcohol use No  . Drug use: No  . Sexual activity: Not on file  Tobacco Counseling Counseling given: Not Answered   Activities of Daily Living In your present state of health, do you have any difficulty performing the following activities: 03/12/2017 06/06/2016  Hearing? N N  Vision? N Y  Difficulty concentrating or making decisions? N N  Walking or climbing stairs? N N  Dressing or bathing? N N  Doing errands, shopping? N N  Preparing Food and eating ? N -  Using the Toilet? N -  In the past six months, have you accidently leaked urine? Y -  Do you have problems with loss of bowel control? N -  Managing your Medications? N -  Managing your Finances? N -  Housekeeping or managing  your Housekeeping? N -  Some recent data might be hidden    Immunizations and Health Maintenance Immunization History  Administered Date(s) Administered  . Influenza, High Dose Seasonal PF 07/22/2015  . Influenza-Unspecified 08/25/2016  . Pneumococcal Conjugate-13 09/07/2016   There are no preventive care reminders to display for this patient.  Patient Care Team: Roselee Nova, MD as PCP - General (Family Medicine) Joan Flores Richard Miu, DO as Consulting Physician (Optometry) Dingeldein, Remo Lipps, MD as Consulting Physician (Ophthalmology)  Indicate any recent Medical Services you may have received from other than [facility] providers in the past year (date may be approximate).     Assessment:   This is a routine wellness examination for Mariela.   Hearing/Vision screen Vision Screening Comments: Pt sees Dr Joan Flores for vision checks every 6 months.  Dietary issues and exercise activities discussed: Current Exercise Habits: Home exercise routine, Type of exercise: walking;Other - see comments (dance), Time (Minutes): 30, Frequency (Times/Week): 6, Weekly Exercise (Minutes/Week): 180, Intensity: Mild  Goals    . Increase water intake          Recommend increasing water intake to 5-6 glasses a day.      Depression Screen PHQ 2/9 Scores 03/12/2017 06/06/2016 11/05/2015 07/22/2015  PHQ - 2 Score 1 0 0 0    Fall Risk Fall Risk  03/12/2017 06/06/2016 11/05/2015 07/22/2015  Falls in the past year? No No Yes Yes  Number falls in past yr: - - 1 1  Injury with Fall? - - No Yes  Risk for fall due to : - - - Other (Comment)  Risk for fall due to (comments): - - - patient states she tripped walking in the front door of her home  Follow up - - - Falls evaluation completed    Cognitive Function:     6CIT Screen 03/12/2017  What Year? 0 points  What month? 0 points  What time? 0 points  Count back from 20 0 points  Months in reverse 0 points  Repeat phrase 0 points  Total Score 0    Screening  Tests Health Maintenance  Topic Date Due  . MAMMOGRAM  02/20/2018 (Originally 08/14/1992)  . INFLUENZA VACCINE  05/23/2017  . PNA vac Low Risk Adult (2 of 2 - PPSV23) 09/07/2017  . TETANUS/TDAP  10/23/2017  . COLONOSCOPY  10/23/2018  . DEXA SCAN  Completed      Plan:  I have personally reviewed and addressed the Medicare Annual Wellness questionnaire and have noted the following in the patient's chart:  A. Medical and social history B. Use of alcohol, tobacco or illicit drugs  C. Current medications and supplements D. Functional ability and status E.  Nutritional status F.  Physical activity G. Advance directives H. List of other physicians I.  Hospitalizations, surgeries,  and ER visits in previous 12 months J.  [place] such as hearing and vision if needed, cognitive and depression L. Referrals and appointments - none  In addition, I have reviewed and discussed with patient certain preventive protocols, quality metrics, and best practice recommendations. A written personalized care plan for preventive services as well as general preventive health recommendations were provided to patient.  See attached scanned questionnaire for additional information.   Signed,  Fabio Neighbors, LPN Nurse Health Advisor   MD Recommendations: None, pt to set up mammogram within the next year and will receive the new shingles vaccine at a later date.  I, as supervising physician, have reviewed the nurse health advisor's Medicare Wellness Visit note for this patient and concur with the findings and recommendations listed above.  Signed Syed Asad A. Manuella Ghazi MD Attending Physician.

## 2017-03-12 NOTE — Patient Instructions (Addendum)
[name] , Thank you for taking time to come for your Medicare Wellness Visit. I appreciate your ongoing commitment to your health goals. Please review the following plan we discussed and let me know if I can assist you in the future.   Screening recommendations/referrals: Colonoscopy: completed 10/23/08, due 10/2018 Mammogram: patient to set up within the next year Bone Density: completed in 2013 (per patient), due 2023 Recommended yearly ophthalmology/optometry visit for glaucoma screening and checkup Recommended yearly dental visit for hygiene and checkup  Vaccinations: Influenza vaccine: up to date, due 06/2017 Pneumococcal vaccine: completed series Tdap vaccine: completed 10/24/2007, due 10/2017 Shingles vaccine: completed (per patient)   Advanced directives: Please bring a copy of your POA (Power of [place]) and/or Living Will to your next appointment.   Conditions/risks identified: Recommend increasing water intake to 5-6 glasses a day.  Next appointment: None, need to schedule 1 year AWV.   Preventive Care 55 Years and Older, Female Preventive care refers to lifestyle choices and visits with your health care provider that can promote health and wellness. What does preventive care include?  A yearly physical exam. This is also called an annual well check.  Dental exams once or twice a year.  Routine eye exams. Ask your health care provider how often you should have your eyes checked.  Personal lifestyle choices, including:  Daily care of your teeth and gums.  Regular physical activity.  Eating a healthy diet.  Avoiding tobacco and drug use.  Limiting alcohol use.  Practicing safe sex.  Taking low-dose aspirin every day.  Taking vitamin and mineral supplements as recommended by your health care provider. What happens during an annual well check? The services and screenings done by your health care provider during your annual well check will depend on your  age, overall health, lifestyle risk factors, and family history of disease. Counseling  Your health care provider may ask you questions about your:  Alcohol use.  Tobacco use.  Drug use.  Emotional well-being.  Home and relationship well-being.  Sexual activity.  Eating habits.  History of falls.  Memory and ability to understand (cognition).  Work and work Statistician.  Reproductive health. Screening  You may have the following tests or measurements:  Height, weight, and BMI.  Blood pressure.  Lipid and cholesterol levels. These may be checked every 5 years, or more frequently if you are over 36 years old.  Skin check.  Lung cancer screening. You may have this screening every year starting at age 78 if you have a 30-pack-year history of smoking and currently smoke or have quit within the past 15 years.  Fecal occult blood test (FOBT) of the stool. You may have this test every year starting at age 69.  Flexible sigmoidoscopy or colonoscopy. You may have a sigmoidoscopy every 5 years or a colonoscopy every 10 years starting at age 63.  Hepatitis C blood test.  Hepatitis B blood test.  Sexually transmitted disease (STD) testing.  Diabetes screening. This is done by checking your blood sugar (glucose) after you have not eaten for a while (fasting). You may have this done every 1-3 years.  Bone density scan. This is done to screen for osteoporosis. You may have this done starting at age 33.  Mammogram. This may be done every 1-2 years. Talk to your health care provider about how often you should have regular mammograms. Talk with your health care provider about your test results, treatment options, and if necessary, the need for more  tests. Vaccines  Your health care provider may recommend certain vaccines, such as:  Influenza vaccine. This is recommended every year.  Tetanus, diphtheria, and acellular pertussis (Tdap, Td) vaccine. You may need a Td booster  every 10 years.  Zoster vaccine. You may need this after age 18.  Pneumococcal 13-valent conjugate (PCV13) vaccine. One dose is recommended after age 1.  Pneumococcal polysaccharide (PPSV23) vaccine. One dose is recommended after age 14. Talk to your health care provider about which screenings and vaccines you need and how often you need them. This information is not intended to replace advice given to you by your health care provider. Make sure you discuss any questions you have with your health care provider. Document Released: 11/05/2015 Document Revised: 06/28/2016 Document Reviewed: 08/10/2015 Elsevier Interactive Patient Education  2017 [place] Prevention in the Home Falls can cause injuries. They can happen to people of all ages. There are many things you can do to make your home safe and to help prevent falls. What can I do on the outside of my home?  Regularly fix the edges of walkways and driveways and fix any cracks.  Remove anything that might make you trip as you walk through a door, such as a raised step or threshold.  Trim any bushes or trees on the path to your home.  Use bright outdoor lighting.  Clear any walking paths of anything that might make someone trip, such as rocks or tools.  Regularly check to see if handrails are loose or broken. Make sure that both sides of any steps have handrails.  Any raised decks and porches should have guardrails on the edges.  Have any leaves, snow, or ice cleared regularly.  Use sand or salt on walking paths during winter.  Clean up any spills in your garage right away. This includes oil or grease spills. What can I do in the bathroom?  Use night lights.  Install grab bars by the toilet and in the tub and shower. Do not use towel bars as grab bars.  Use non-skid mats or decals in the tub or shower.  If you need to sit down in the shower, use a plastic, non-slip stool.  Keep the floor dry. Clean up any  water that spills on the floor as soon as it happens.  Remove soap buildup in the tub or shower regularly.  Attach bath mats securely with double-sided non-slip rug tape.  Do not have throw rugs and other things on the floor that can make you trip. What can I do in the bedroom?  Use night lights.  Make sure that you have a light by your bed that is easy to reach.  Do not use any sheets or blankets that are too big for your bed. They should not hang down onto the floor.  Have a firm chair that has side arms. You can use this for support while you get dressed.  Do not have throw rugs and other things on the floor that can make you trip. What can I do in the kitchen?  Clean up any spills right away.  Avoid walking on wet floors.  Keep items that you use a lot in easy-to-reach places.  If you need to reach something above you, use a strong step stool that has a grab bar.  Keep electrical cords out of the way.  Do not use floor polish or wax that makes floors slippery. If you must use wax, use non-skid floor  wax.  Do not have throw rugs and other things on the floor that can make you trip. What can I do with my stairs?  Do not leave any items on the stairs.  Make sure that there are handrails on both sides of the stairs and use them. Fix handrails that are broken or loose. Make sure that handrails are as long as the stairways.  Check any carpeting to make sure that it is firmly attached to the stairs. Fix any carpet that is loose or worn.  Avoid having throw rugs at the top or bottom of the stairs. If you do have throw rugs, attach them to the floor with carpet tape.  Make sure that you have a light switch at the top of the stairs and the bottom of the stairs. If you do not have them, ask someone to add them for you. What else can I do to help prevent falls?  Wear shoes that:  Do not have high heels.  Have rubber bottoms.  Are comfortable and fit you well.  Are closed  at the toe. Do not wear sandals.  If you use a stepladder:  Make sure that it is fully opened. Do not climb a closed stepladder.  Make sure that both sides of the stepladder are locked into place.  Ask someone to hold it for you, if possible.  Clearly mark and make sure that you can see:  Any grab bars or handrails.  First and last steps.  Where the edge of each step is.  Use tools that help you move around (mobility aids) if they are needed. These include:  Canes.  Walkers.  Scooters.  Crutches.  Turn on the lights when you go into a dark area. Replace any light bulbs as soon as they burn out.  Set up your furniture so you have a clear path. Avoid moving your furniture around.  If any of your floors are uneven, fix them.  If there are any pets around you, be aware of where they are.  Review your medicines with your doctor. Some medicines can make you feel dizzy. This can increase your chance of falling. Ask your doctor what other things that you can do to help prevent falls. This information is not intended to replace advice given to you by your health care provider. Make sure you discuss any questions you have with your health care provider. Document Released: 08/05/2009 Document Revised: 03/16/2016 Document Reviewed: 11/13/2014 Elsevier Interactive Patient Education  2017 [employer].

## 2017-03-13 ENCOUNTER — Encounter: Payer: Self-pay | Admitting: Family Medicine

## 2017-03-14 LAB — LIPID PANEL
CHOL/HDL RATIO: 3.3 ratio (ref <5.0)
Cholesterol: 156 mg/dL (ref <200)
HDL: 47 mg/dL — AB (ref >50)
LDL CALC: 77 mg/dL (ref <100)
Triglycerides: 159 mg/dL — ABNORMAL HIGH (ref <150)
VLDL: 32 mg/dL — ABNORMAL HIGH (ref <30)

## 2017-03-15 LAB — TSH: TSH: 3.13 mIU/L

## 2017-03-20 ENCOUNTER — Other Ambulatory Visit: Payer: Self-pay | Admitting: Family Medicine

## 2017-03-20 DIAGNOSIS — M47812 Spondylosis without myelopathy or radiculopathy, cervical region: Secondary | ICD-10-CM

## 2017-04-09 ENCOUNTER — Other Ambulatory Visit: Payer: Self-pay | Admitting: Family Medicine

## 2017-04-14 ENCOUNTER — Other Ambulatory Visit: Payer: Self-pay | Admitting: Family Medicine

## 2017-04-14 DIAGNOSIS — E785 Hyperlipidemia, unspecified: Secondary | ICD-10-CM

## 2017-05-01 ENCOUNTER — Encounter: Payer: Medicare Other | Admitting: Family Medicine

## 2017-05-09 ENCOUNTER — Other Ambulatory Visit: Payer: Self-pay | Admitting: Family Medicine

## 2017-05-09 DIAGNOSIS — I10 Essential (primary) hypertension: Secondary | ICD-10-CM

## 2017-05-17 ENCOUNTER — Encounter: Payer: Self-pay | Admitting: Family Medicine

## 2017-05-17 ENCOUNTER — Ambulatory Visit (INDEPENDENT_AMBULATORY_CARE_PROVIDER_SITE_OTHER): Payer: Medicare Other | Admitting: Family Medicine

## 2017-05-17 VITALS — BP 140/70 | HR 92 | Temp 97.3°F | Resp 16 | Ht 67.0 in | Wt 152.6 lb

## 2017-05-17 DIAGNOSIS — Z780 Asymptomatic menopausal state: Secondary | ICD-10-CM

## 2017-05-17 DIAGNOSIS — R739 Hyperglycemia, unspecified: Secondary | ICD-10-CM | POA: Diagnosis not present

## 2017-05-17 DIAGNOSIS — I10 Essential (primary) hypertension: Secondary | ICD-10-CM | POA: Diagnosis not present

## 2017-05-17 DIAGNOSIS — E559 Vitamin D deficiency, unspecified: Secondary | ICD-10-CM | POA: Diagnosis not present

## 2017-05-17 DIAGNOSIS — Z1239 Encounter for other screening for malignant neoplasm of breast: Secondary | ICD-10-CM

## 2017-05-17 DIAGNOSIS — Z0000 Encounter for general adult medical examination without abnormal findings: Secondary | ICD-10-CM

## 2017-05-17 LAB — CBC WITH DIFFERENTIAL/PLATELET
BASOS PCT: 0 %
Basophils Absolute: 0 cells/uL (ref 0–200)
Eosinophils Absolute: 142 cells/uL (ref 15–500)
Eosinophils Relative: 2 %
HEMATOCRIT: 38.4 % (ref 35.0–45.0)
Hemoglobin: 12.9 g/dL (ref 11.7–15.5)
LYMPHS PCT: 20 %
Lymphs Abs: 1420 cells/uL (ref 850–3900)
MCH: 32.1 pg (ref 27.0–33.0)
MCHC: 33.6 g/dL (ref 32.0–36.0)
MCV: 95.5 fL (ref 80.0–100.0)
MONO ABS: 497 {cells}/uL (ref 200–950)
MPV: 8.8 fL (ref 7.5–12.5)
Monocytes Relative: 7 %
NEUTROS ABS: 5041 {cells}/uL (ref 1500–7800)
Neutrophils Relative %: 71 %
PLATELETS: 230 10*3/uL (ref 140–400)
RBC: 4.02 MIL/uL (ref 3.80–5.10)
RDW: 13.8 % (ref 11.0–15.0)
WBC: 7.1 10*3/uL (ref 3.8–10.8)

## 2017-05-17 LAB — POCT GLYCOSYLATED HEMOGLOBIN (HGB A1C): Hemoglobin A1C: 5.7

## 2017-05-17 NOTE — Progress Notes (Signed)
Name: [name]   MRN: [id number]    DOB: [birth year]   Date:05/17/2017       Progress Note  Subjective  Chief Complaint  Chief Complaint  Patient presents with  . Annual Exam    CPE    HPI  Pt. Presents for a Complete Physical Exam.  She had her colonoscopy in 2010, records are not available but she believes she needs to repeat in 2020. She is due for mammogram and BMD testing.     Past Medical History:  Diagnosis Date  . Bronchitis   . DJD (degenerative joint disease), cervical   . Hyperlipidemia   . Hypertension   . Hypothyroid   . LBBB (left bundle branch block)   . SVT (supraventricular tachycardia) (HCC)    a. s/p ablation by Dr Lovena Le 5/13    Past Surgical History:  Procedure Laterality Date  . ELECTROPHYSIOLOGY STUDY N/A 02/27/2012   Procedure: ELECTROPHYSIOLOGY STUDY;  Surgeon: Evans Lance, MD;  [facility];  Service: Cardiovascular;  Laterality: N/A;  . EPS and ablation for SVT  5/13   slow pathway ablation by Dr Lovena Le  . EYE SURGERY     surgery for detatched retina  . SUPRAVENTRICULAR TACHYCARDIA ABLATION N/A 02/27/2012   Procedure: SUPRAVENTRICULAR TACHYCARDIA ABLATION;  Surgeon: Evans Lance, MD;  Location: [facility] CATH LAB;  Service: Cardiovascular;  Laterality: N/A;    Family History  Problem Relation Age of Onset  . Alzheimer's disease Mother   . Cancer Father   . Stroke Father   . Cancer Paternal Aunt   . Thyroid disease Maternal Grandmother     Social History   Social History  . Marital status: Married    Spouse name: N/A  . Number of children: N/A  . Years of education: N/A   Occupational History  . Not on file.   Social History Main Topics  . Smoking status: Never Smoker  . Smokeless tobacco: Never Used  . Alcohol use No  . Drug use: No  . Sexual activity: Not on file   Other Topics Concern  . Not on file   Social History Narrative   Lives in [place]     Current Outpatient Prescriptions:  .   acetaminophen (TYLENOL) 325 MG tablet, Take 650 mg by mouth every 6 (six) hours as needed. For pain, Disp: , Rfl:  .  amLODipine (NORVASC) 2.5 MG tablet, TAKE 1 TABLET(2.5 MG) BY MOUTH DAILY, Disp: 90 tablet, Rfl: 0 .  aspirin EC 81 MG tablet, Take 162 mg by mouth daily. , Disp: , Rfl:  .  atorvastatin (LIPITOR) 20 MG tablet, TAKE 1 TABLET(20 MG) BY MOUTH DAILY AT 6 PM, Disp: 90 tablet, Rfl: 0 .  atorvastatin (LIPITOR) 20 MG tablet, TAKE 1 TABLET(20 MG) BY MOUTH DAILY AT 6 PM, Disp: 90 tablet, Rfl: 0 .  busPIRone (BUSPAR) 5 MG tablet, TAKE 1 TABLET BY MOUTH TWICE DAILY, Disp: 270 tablet, Rfl: 0 .  cholecalciferol (VITAMIN D) 1000 UNITS tablet, Take 2,000 Units by mouth daily., Disp: , Rfl:  .  cyclobenzaprine (FLEXERIL) 10 MG tablet, TAKE 1 TABLET BY MOUTH AT BEDTIME, Disp: 90 tablet, Rfl: 0 .  levothyroxine (SYNTHROID, LEVOTHROID) 137 MCG tablet, TAKE 1 TABLET(137 MCG) BY MOUTH EVERY MORNING, Disp: 90 tablet, Rfl: 0 .  loratadine (CLARITIN) 10 MG tablet, Take 10 mg by mouth daily., Disp: , Rfl:  .  losartan-hydrochlorothiazide (HYZAAR) 100-12.5 MG tablet, Take 1 tablet by mouth daily., Disp: 90 tablet, Rfl:  0 .  metoprolol succinate (TOPROL-XL) 50 MG 24 hr tablet, Take 1 tablet (50 mg total) by mouth daily. Take with or immediately following a meal., Disp: 90 tablet, Rfl: 1 .  Multiple Vitamin (MULITIVITAMIN WITH MINERALS) TABS, Take 1 tablet by mouth daily., Disp: , Rfl:  .  potassium chloride (MICRO-K) 10 MEQ CR capsule, Take 1 capsule (10 mEq total) by mouth daily., Disp: 90 capsule, Rfl: 1  Allergies  Allergen Reactions  . Codeine Nausea And Vomiting  . Erythromycin Swelling  . Penicillins Swelling  . Septra [Sulfamethoxazole-Trimethoprim] Swelling     Review of Systems  Constitutional: Negative for chills, fever and malaise/fatigue.  HENT: Negative for congestion, ear pain and sore throat.   Eyes: Negative for blurred vision and double vision.  Respiratory: Negative for cough,  sputum production and shortness of breath.   Cardiovascular: Negative for chest pain and leg swelling (ankle swelling).  Gastrointestinal: Negative for blood in stool, constipation, diarrhea, nausea and vomiting.  Genitourinary: Negative for hematuria.  Musculoskeletal: Positive for neck pain. Negative for back pain.  Neurological: Negative for dizziness and headaches.  Psychiatric/Behavioral: Negative for depression. The patient is not nervous/anxious and does not have insomnia.     Objective  Vitals:   05/17/17 1050  BP: 140/70  Pulse: 92  Resp: 16  Temp: (!) 97.3 F (36.3 C)  TempSrc: Oral  SpO2: 96%  Weight: 152 lb 9.6 oz (69.2 kg)  Height: 5\' 7"  (1.702 m)    Physical Exam  Constitutional: She is oriented to person, place, and time and well-developed, well-nourished, and in no distress.  HENT:  Head: Normocephalic and atraumatic.  Right Ear: External ear normal.  Left Ear: External ear normal.  Cardiovascular: Normal rate, regular rhythm and normal heart sounds.   No murmur heard. Pulmonary/Chest: Effort normal and breath sounds normal. She has no wheezes.  Abdominal: Soft. Bowel sounds are normal. There is no tenderness.  Musculoskeletal: She exhibits no edema.  Neurological: She is alert and oriented to person, place, and time.  Psychiatric: Mood, memory, affect and judgment normal.  Nursing note and vitals reviewed.     Assessment & Plan  1. Well woman exam (no gynecological exam) Obtain age-appropriate laboratory screenings - VITAMIN D 25 Hydroxy (Vit-D Deficiency, Fractures) - CBC with Differential/Platelet  2. Screening for breast cancer  - MM DIGITAL SCREENING BILATERAL; Future  3. Post-menopausal  - DG Bone Density; Future  4. Hyperglycemia A1c is 5.7%,  considered normal  -POCT glycosylated hemoglobin (Hb A1C)   [name] [name] 05/17/2017 11:03 AM

## 2017-05-18 LAB — VITAMIN D 25 HYDROXY (VIT D DEFICIENCY, FRACTURES): Vit D, 25-Hydroxy: 46 ng/mL (ref 30–100)

## 2017-06-12 ENCOUNTER — Ambulatory Visit
Admission: RE | Admit: 2017-06-12 | Discharge: 2017-06-12 | Disposition: A | Discharge status: Home / Self Care | Payer: Medicare Other | Source: Ambulatory Visit | Attending: Family Medicine | Admitting: Family Medicine

## 2017-06-12 DIAGNOSIS — Z780 Asymptomatic menopausal state: Secondary | ICD-10-CM | POA: Insufficient documentation

## 2017-06-12 DIAGNOSIS — Z1239 Encounter for other screening for malignant neoplasm of breast: Secondary | ICD-10-CM

## 2017-06-12 DIAGNOSIS — Z1231 Encounter for screening mammogram for malignant neoplasm of breast: Secondary | ICD-10-CM | POA: Insufficient documentation

## 2017-06-13 ENCOUNTER — Other Ambulatory Visit: Payer: Self-pay | Admitting: Family Medicine

## 2017-06-13 DIAGNOSIS — R928 Other abnormal and inconclusive findings on diagnostic imaging of breast: Secondary | ICD-10-CM

## 2017-06-13 DIAGNOSIS — N6489 Other specified disorders of breast: Secondary | ICD-10-CM

## 2017-06-18 ENCOUNTER — Other Ambulatory Visit: Payer: Self-pay | Admitting: Family Medicine

## 2017-06-18 ENCOUNTER — Other Ambulatory Visit: Payer: Self-pay | Admitting: *Deleted

## 2017-06-18 ENCOUNTER — Inpatient Hospital Stay
Admission: RE | Admit: 2017-06-18 | Discharge: 2017-06-18 | Disposition: A | Discharge status: Home / Self Care | Payer: Self-pay | Source: Ambulatory Visit | Attending: *Deleted | Admitting: *Deleted

## 2017-06-18 DIAGNOSIS — Z9289 Personal history of other medical treatment: Secondary | ICD-10-CM

## 2017-06-18 DIAGNOSIS — M47812 Spondylosis without myelopathy or radiculopathy, cervical region: Secondary | ICD-10-CM

## 2017-06-28 ENCOUNTER — Ambulatory Visit
Admission: RE | Admit: 2017-06-28 | Discharge: 2017-06-28 | Disposition: A | Discharge status: Home / Self Care | Payer: Medicare Other | Source: Ambulatory Visit | Attending: Family Medicine | Admitting: Family Medicine

## 2017-06-28 DIAGNOSIS — N6489 Other specified disorders of breast: Secondary | ICD-10-CM | POA: Diagnosis not present

## 2017-06-28 DIAGNOSIS — R928 Other abnormal and inconclusive findings on diagnostic imaging of breast: Secondary | ICD-10-CM | POA: Insufficient documentation

## 2017-06-28 DIAGNOSIS — R922 Inconclusive mammogram: Secondary | ICD-10-CM | POA: Diagnosis not present

## 2017-07-07 ENCOUNTER — Other Ambulatory Visit: Payer: Self-pay | Admitting: Family Medicine

## 2017-07-11 ENCOUNTER — Other Ambulatory Visit: Payer: Self-pay | Admitting: Family Medicine

## 2017-07-11 DIAGNOSIS — E785 Hyperlipidemia, unspecified: Secondary | ICD-10-CM

## 2017-07-11 DIAGNOSIS — I471 Supraventricular tachycardia: Secondary | ICD-10-CM

## 2017-07-12 ENCOUNTER — Other Ambulatory Visit: Payer: Self-pay | Admitting: Family Medicine

## 2017-07-23 ENCOUNTER — Other Ambulatory Visit: Payer: Self-pay | Admitting: Family Medicine

## 2017-07-23 DIAGNOSIS — I10 Essential (primary) hypertension: Secondary | ICD-10-CM

## 2017-08-09 ENCOUNTER — Other Ambulatory Visit: Payer: Self-pay | Admitting: Family Medicine

## 2017-08-09 DIAGNOSIS — I10 Essential (primary) hypertension: Secondary | ICD-10-CM

## 2017-08-10 ENCOUNTER — Other Ambulatory Visit: Payer: Self-pay | Admitting: Family Medicine

## 2017-08-10 DIAGNOSIS — I10 Essential (primary) hypertension: Secondary | ICD-10-CM

## 2017-08-17 ENCOUNTER — Ambulatory Visit: Payer: Medicare Other | Admitting: Family Medicine

## 2017-08-31 ENCOUNTER — Ambulatory Visit: Payer: Medicare Other | Admitting: Family Medicine

## 2017-09-03 ENCOUNTER — Ambulatory Visit (INDEPENDENT_AMBULATORY_CARE_PROVIDER_SITE_OTHER): Payer: Medicare Other | Admitting: Family Medicine

## 2017-09-03 ENCOUNTER — Encounter: Payer: Self-pay | Admitting: Family Medicine

## 2017-09-03 VITALS — BP 136/72 | HR 83 | Temp 98.3°F | Resp 14 | Ht 66.0 in | Wt 150.6 lb

## 2017-09-03 DIAGNOSIS — E039 Hypothyroidism, unspecified: Secondary | ICD-10-CM

## 2017-09-03 DIAGNOSIS — I10 Essential (primary) hypertension: Secondary | ICD-10-CM | POA: Diagnosis not present

## 2017-09-03 DIAGNOSIS — E782 Mixed hyperlipidemia: Secondary | ICD-10-CM

## 2017-09-03 NOTE — Progress Notes (Signed)
Name: [name]   MRN: [id number]    DOB: [birth year]   Date:09/03/2017       Progress Note  Subjective  Chief Complaint  Chief Complaint  Patient presents with  . Hypothyroidism    3 mnth f/u  . Hypertension    3 mnth f/u  . Hyperlipidemia    3 mnth f/u    Hypertension  This is a chronic problem. The problem is unchanged. The problem is controlled. Pertinent negatives include no blurred vision, chest pain, headaches, palpitations or shortness of breath. Past treatments include calcium channel blockers, beta blockers, angiotensin blockers and diuretics. There is no history of kidney disease, CAD/MI or CVA. Identifiable causes of hypertension include a thyroid problem.  Hyperlipidemia  This is a chronic problem. The problem is uncontrolled. Recent lipid tests were reviewed and are high (elevated Triglycerides.). Pertinent negatives include no chest pain, leg pain, myalgias or shortness of breath. Current antihyperlipidemic treatment includes statins.  Thyroid Problem  Presents for follow-up visit. Patient reports no cold intolerance, constipation, depressed mood, diaphoresis, dry skin, fatigue or palpitations. The symptoms have been stable. Her past medical history is significant for hyperlipidemia.     Past Medical History:  Diagnosis Date  . Bronchitis   . DJD (degenerative joint disease), cervical   . Hyperlipidemia   . Hypertension   . Hypothyroid   . LBBB (left bundle branch block)   . SVT (supraventricular tachycardia) (HCC)    a. s/p ablation by Dr Lovena Le 5/13    Past Surgical History:  Procedure Laterality Date  . EPS and ablation for SVT  5/13   slow pathway ablation by Dr Lovena Le  . EYE SURGERY     surgery for detatched retina    Family History  Problem Relation Age of Onset  . Alzheimer's disease Mother   . Cancer Father   . Stroke Father   . Cancer Paternal Aunt   . Thyroid disease Maternal Grandmother     Social History   Socioeconomic History   . Marital status: Married    Spouse name: Not on file  . Number of children: Not on file  . Years of education: Not on file  . Highest education level: Not on file  Social Needs  . Financial resource strain: Not on file  . Food insecurity - worry: Not on file  . Food insecurity - inability: Not on file  . Transportation needs - medical: Not on file  . Transportation needs - non-medical: Not on file  Occupational History  . Not on file  Tobacco Use  . Smoking status: Never Smoker  . Smokeless tobacco: Never Used  Substance and Sexual Activity  . Alcohol use: No  . Drug use: No  . Sexual activity: Not on file  Other Topics Concern  . Not on file  Social History Narrative   Lives in [place]     Current Outpatient Medications:  .  acetaminophen (TYLENOL) 325 MG tablet, Take 650 mg by mouth every 6 (six) hours as needed. For pain, Disp: , Rfl:  .  amLODipine (NORVASC) 2.5 MG tablet, TAKE 1 TABLET(2.5 MG) BY MOUTH DAILY, Disp: 90 tablet, Rfl: 0 .  aspirin EC 81 MG tablet, Take 162 mg by mouth daily. , Disp: , Rfl:  .  atorvastatin (LIPITOR) 20 MG tablet, TAKE 1 TABLET(20 MG) BY MOUTH DAILY AT 6 PM, Disp: 90 tablet, Rfl: 0 .  busPIRone (BUSPAR) 5 MG tablet, Take 1 tablet (5 mg total) by mouth  2 (two) times daily., Disp: 180 tablet, Rfl: 0 .  cholecalciferol (VITAMIN D) 1000 UNITS tablet, Take 2,000 Units by mouth daily., Disp: , Rfl:  .  levothyroxine (SYNTHROID, LEVOTHROID) 137 MCG tablet, TAKE 1 TABLET(137 MCG) BY MOUTH EVERY MORNING, Disp: 90 tablet, Rfl: 0 .  loratadine (CLARITIN) 10 MG tablet, Take 10 mg by mouth daily., Disp: , Rfl:  .  losartan-hydrochlorothiazide (HYZAAR) 100-12.5 MG tablet, TAKE 1 TABLET BY MOUTH DAILY, Disp: 90 tablet, Rfl: 0 .  metoprolol succinate (TOPROL-XL) 50 MG 24 hr tablet, TAKE 1 TABLET BY MOUTH DAILY. TAKE WITH OR IMMEDIATELY FOLLOWING A MEAL, Disp: 90 tablet, Rfl: 0 .  Multiple Vitamin (MULITIVITAMIN WITH MINERALS) TABS, Take 1 tablet by mouth  daily., Disp: , Rfl:  .  potassium chloride (MICRO-K) 10 MEQ CR capsule, TAKE 1 CAPSULE(10 MEQ) BY MOUTH DAILY, Disp: 90 capsule, Rfl: 0 .  atorvastatin (LIPITOR) 20 MG tablet, TAKE 1 TABLET(20 MG) BY MOUTH DAILY AT 6 PM (Patient not taking: Reported on 09/03/2017), Disp: 90 tablet, Rfl: 0  Allergies  Allergen Reactions  . Codeine Nausea And Vomiting  . Erythromycin Swelling  . Penicillins Swelling  . Septra [Sulfamethoxazole-Trimethoprim] Swelling     Review of Systems  Constitutional: Negative for diaphoresis and fatigue.  Eyes: Negative for blurred vision.  Respiratory: Negative for shortness of breath.   Cardiovascular: Negative for chest pain and palpitations.  Gastrointestinal: Negative for constipation.  Musculoskeletal: Negative for myalgias.  Neurological: Negative for headaches.  Endo/Heme/Allergies: Negative for cold intolerance.    Objective  Vitals:   09/03/17 1035  BP: 136/72  Pulse: 83  Resp: 14  Temp: 98.3 F (36.8 C)  TempSrc: Oral  SpO2: 98%  Weight: 150 lb 9.6 oz (68.3 kg)  Height: 5\' 6"  (1.676 m)    Physical Exam  Constitutional: She is well-developed, well-nourished, and in no distress.  HENT:  Head: Normocephalic and atraumatic.  Neck: No thyromegaly present.  Cardiovascular: Normal rate, regular rhythm and normal heart sounds.  No murmur heard. Pulmonary/Chest: Effort normal and breath sounds normal. She has no wheezes.  Abdominal: Soft. Bowel sounds are normal. There is no tenderness.  Musculoskeletal: She exhibits edema (trace pitting edema).  Skin: Skin is warm and dry.  Psychiatric: Mood, memory, affect and judgment normal.  Nursing note and vitals reviewed.    Assessment & Plan  1. Essential hypertension BP stable on present anti-hypertensive treatment  2. Mixed hyperlipidemia Elevated triglycerides from May 2018, repeat and no consider adjusting statin - Lipid panel  3. Hypothyroidism, unspecified type TSH normal 5 months  ago, repeat levels today - TSH   [facility] 09/03/2017 10:56 AM

## 2017-09-04 DIAGNOSIS — E039 Hypothyroidism, unspecified: Secondary | ICD-10-CM | POA: Diagnosis not present

## 2017-09-04 DIAGNOSIS — E782 Mixed hyperlipidemia: Secondary | ICD-10-CM | POA: Diagnosis not present

## 2017-09-04 LAB — TSH: TSH: 3.97 m[IU]/L (ref 0.40–4.50)

## 2017-09-04 LAB — LIPID PANEL
CHOL/HDL RATIO: 2.9 (calc) (ref <5.0)
Cholesterol: 173 mg/dL (ref <200)
HDL: 60 mg/dL (ref >50)
LDL Cholesterol (Calc): 87 mg/dL (calc)
NON-HDL CHOLESTEROL (CALC): 113 mg/dL (ref <130)
Triglycerides: 159 mg/dL — ABNORMAL HIGH (ref <150)

## 2017-10-08 ENCOUNTER — Other Ambulatory Visit: Payer: Self-pay | Admitting: Family Medicine

## 2017-10-08 DIAGNOSIS — I471 Supraventricular tachycardia: Secondary | ICD-10-CM

## 2017-10-08 DIAGNOSIS — E785 Hyperlipidemia, unspecified: Secondary | ICD-10-CM

## 2017-10-10 MED ORDER — METOPROLOL SUCCINATE ER 50 MG PO TB24: ORAL_TABLET | ORAL | 0 refills | Status: DC

## 2017-10-10 MED ORDER — ATORVASTATIN CALCIUM 20 MG PO TABS: ORAL_TABLET | ORAL | 0 refills | Status: DC

## 2017-10-10 NOTE — Telephone Encounter (Signed)
PT seen 09/03/2017 for HTN, hyperlipidemia, and Hypothyroidism by PCP Dr. Manuella Ghazi. Lipid panel and TSH reviewed from 09/04/17, pt to remain on current regimen. F/u on 12/04/17 is scheduled. Pt is completely out of medications today, 90-day supplies are provided per orders.

## 2017-10-10 NOTE — Telephone Encounter (Signed)
Recent labs were done. Last visit was 09/03/2017.

## 2017-10-11 ENCOUNTER — Other Ambulatory Visit: Payer: Self-pay | Admitting: Family Medicine

## 2017-10-20 ENCOUNTER — Other Ambulatory Visit: Payer: Self-pay | Admitting: Family Medicine

## 2017-10-20 DIAGNOSIS — I10 Essential (primary) hypertension: Secondary | ICD-10-CM

## 2017-11-04 ENCOUNTER — Other Ambulatory Visit: Payer: Self-pay | Admitting: Family Medicine

## 2017-11-04 DIAGNOSIS — I10 Essential (primary) hypertension: Secondary | ICD-10-CM

## 2017-11-06 ENCOUNTER — Other Ambulatory Visit: Payer: Self-pay | Admitting: Family Medicine

## 2017-11-06 DIAGNOSIS — I10 Essential (primary) hypertension: Secondary | ICD-10-CM

## 2017-11-09 ENCOUNTER — Encounter: Payer: Self-pay | Admitting: Family Medicine

## 2017-11-09 ENCOUNTER — Ambulatory Visit (INDEPENDENT_AMBULATORY_CARE_PROVIDER_SITE_OTHER): Payer: Medicare Other | Admitting: Family Medicine

## 2017-11-09 VITALS — BP 120/80 | HR 82 | Temp 98.1°F | Resp 16 | Ht 66.0 in | Wt 153.8 lb

## 2017-11-09 DIAGNOSIS — J069 Acute upper respiratory infection, unspecified: Secondary | ICD-10-CM | POA: Diagnosis not present

## 2017-11-09 MED ORDER — PROMETHAZINE-DM 6.25-15 MG/5ML PO SYRP: 5.0000 mL | ORAL_SOLUTION | Freq: Four times a day (QID) | ORAL | 0 refills | Status: DC | PRN

## 2017-11-09 MED ORDER — FLUTICASONE PROPIONATE 50 MCG/ACT NA SUSP: 2.0000 | Freq: Every day | NASAL | 0 refills | Status: DC

## 2017-11-09 MED ORDER — FEXOFENADINE HCL 180 MG PO TABS: 180.0000 mg | ORAL_TABLET | Freq: Every day | ORAL | Status: DC

## 2017-11-09 NOTE — Progress Notes (Signed)
Name: [name]   MRN: [id number]    DOB: 09/10/1941   Date:11/09/2017       Progress Note  Subjective  Chief Complaint  Chief Complaint  Patient presents with  . Sinusitis    cough, bodyaches for 3 days    HPI  Patient presents with 3 day history of cold symptoms.  She reports coughing fits where she has trouble catching her breath - this has been keeping her up at night.  She also endorses chest congestion with some occasional discomfort with coughing. Endorses some shortness of breath, occasional diarrhea and nausea as well.  Reports temp was 74F yesterday at home, and chills.  No abdominal pain, body aches.  Has not been taking her claritin because it doesn't really do anything.  She had flu last year, but says this feels much less severe.  Patient Active Problem List   Diagnosis Date Noted  . Hyperglycemia 03/06/2016  . Hypothyroid 04/21/2015  . 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 Anxiety 04/21/2015  . Hyperlipidemia 04/21/2015  . Diuretic-induced hypokalemia 04/21/2015  . Palpitations 12/18/2012  . SVT (supraventricular tachycardia) ([place]) 05/27/2012  . Hypertension 05/27/2012    Social History   Tobacco Use  . Smoking status: Never Smoker  . Smokeless tobacco: Never Used  Substance Use Topics  . Alcohol use: No     Current Outpatient Medications:  .  acetaminophen (TYLENOL) 325 MG tablet, Take 650 mg by mouth every 6 (six) hours as needed. For pain, Disp: , Rfl:  .  amLODipine (NORVASC) 2.5 MG tablet, TAKE 1 TABLET(2.5 MG) BY MOUTH DAILY, Disp: 90 tablet, Rfl: 0 .  aspirin EC 81 MG tablet, Take 162 mg by mouth daily. , Disp: , Rfl:  .  atorvastatin (LIPITOR) 20 MG tablet, TAKE 1 TABLET(20 MG) BY MOUTH DAILY AT 6 PM, Disp: 90 tablet, Rfl: 0 .  busPIRone (BUSPAR) 5 MG tablet, TAKE 1 TABLET(5 MG) BY MOUTH TWICE DAILY, Disp: 180 tablet, Rfl: 0 .  cholecalciferol (VITAMIN D) 1000 UNITS tablet, Take 2,000 Units by mouth daily., Disp: ,  Rfl:  .  levothyroxine (SYNTHROID, LEVOTHROID) 137 MCG tablet, TAKE 1 TABLET(137 MCG) BY MOUTH EVERY MORNING, Disp: 90 tablet, Rfl: 0 .  loratadine (CLARITIN) 10 MG tablet, Take 10 mg by mouth daily., Disp: , Rfl:  .  losartan-hydrochlorothiazide (HYZAAR) 100-12.5 MG tablet, TAKE 1 TABLET BY MOUTH DAILY, Disp: 90 tablet, Rfl: 0 .  metoprolol succinate (TOPROL-XL) 50 MG 24 hr tablet, TAKE 1 TABLET BY MOUTH DAILY. TAKE WITH OR IMMEDIATELY FOLLOWING A MEAL, Disp: 90 tablet, Rfl: 0 .  Multiple Vitamin (MULITIVITAMIN WITH MINERALS) TABS, Take 1 tablet by mouth daily., Disp: , Rfl:  .  potassium chloride (MICRO-K) 10 MEQ CR capsule, TAKE 1 CAPSULE(10 MEQ) BY MOUTH DAILY, Disp: 90 capsule, Rfl: 0  Allergies  Allergen Reactions  . Codeine Nausea And Vomiting  . Erythromycin Swelling  . Penicillins Swelling  . Septra [Sulfamethoxazole-Trimethoprim] Swelling    ROS  Ten systems reviewed and is negative except as mentioned in HPI  Objective  Vitals:   11/09/17 0818  BP: 120/80  Pulse: 82  Resp: 16  Temp: 98.1 F (36.7 C)  TempSrc: Oral  SpO2: 96%  Weight: 153 lb 12.8 oz (69.8 kg)  Height: 5\' 6"  (1.676 m)    Body mass index is 24.82 kg/m.  Nursing Note and Vital Signs reviewed.  Physical Exam  Constitutional: Patient appears well-developed and well-nourished.  No distress.  HEENT:  head atraumatic, normocephalic, pupils equal and reactive to light, EOM's intact, TM's without erythema or bulging, +maxillary sinus tenderness (mild), no frontal sinus tenderness, neck supple without lymphadenopathy, oropharynx pink and moist without exudate Cardiovascular: Normal rate, regular rhythm, S1/S2 present.  No murmur or rub heard. No BLE edema. Pulmonary/Chest: Effort normal and breath sounds clear. No respiratory distress or retractions. Abdominal: Soft and non-tender, bowel sounds present x4 quadrants. Psychiatric: Patient has a normal mood and affect. behavior is normal. Judgment and  thought content normal.  No results found for this or any previous visit (from the past 72 hour(s)).  Assessment & Plan  1. Upper respiratory tract infection, unspecified type - Saline nasal spray PRN, cool mist humidifier. - promethazine-dextromethorphan (PROMETHAZINE-DM) 6.25-15 MG/5ML syrup; Take 5 mLs by mouth 4 (four) times daily as needed for cough.  Dispense: 180 mL; Refill: 0 - fluticasone (FLONASE) 50 MCG/ACT nasal spray; Place 2 sprays into both nostrils daily.  Dispense: 16 g; Refill: 0 - fexofenadine (ALLEGRA ALLERGY) 180 MG tablet; Take 1 tablet (180 mg total) by mouth daily. - Advised that I do not suspect this is influenza based on mildness of symptoms and lack of fever/body aches/sudden onset.  We will monitor and treat symptomatically for the time being. Follow up in 5-7 days if not improving, sooner if worsening.   -Red flags and when to present for emergency care or RTC including fever >101.42F, chest pain, shortness of breath, new/worsening/un-resolving symptoms, reviewed with patient at time of visit. Follow up and care instructions discussed and provided in AVS.

## 2017-11-09 NOTE — Patient Instructions (Signed)
Cool Mist Vaporizer A cool mist vaporizer is a device that releases a cool mist into the air. If you have a cough or a cold, using a vaporizer may help relieve your symptoms. The mist adds moisture to the air, which may help thin your mucus and make it less sticky. When your mucus is thin and less sticky, it easier for you to breathe and to cough up secretions. Do not use a vaporizer if you are allergic to mold. Follow these instructions at home:  Follow the instructions that come with the vaporizer.  Do not use anything other than distilled water in the vaporizer.  Do not run the vaporizer all of the time. Doing that can cause mold or bacteria to grow in the vaporizer.  Clean the vaporizer after each time that you use it.  Clean and dry the vaporizer well before storing it.  Stop using the vaporizer if your breathing symptoms get worse. This information is not intended to replace advice given to you by your health care provider. Make sure you discuss any questions you have with your health care provider. Document Released: 07/06/2004 Document Revised: 04/28/2016 Document Reviewed: 01/08/2016 Elsevier Interactive Patient Education  2018 Elsevier Inc[employer]   Upper Respiratory Infection, Adult Most upper respiratory infections (URIs) are caused by a virus. A URI affects the nose, throat, and upper air passages. The most common type of URI is often called "the common cold." Follow these instructions at home:  Take medicines only as told by your doctor.  Gargle warm saltwater or take cough drops to comfort your throat as told by your doctor.  Use a warm mist humidifier or inhale steam from a shower to increase air moisture. This may make it easier to breathe.  Drink enough fluid to keep your pee (urine) clear or pale yellow.  Eat soups and other clear broths.  Have a healthy diet.  Rest as needed.  Go back to work when your fever is gone or your doctor says it is okay. ? You may need  to stay home longer to avoid giving your URI to others. ? You can also wear a face mask and wash your hands often to prevent spread of the virus.  Use your inhaler more if you have asthma.  Do not use any tobacco products, including cigarettes, chewing tobacco, or electronic cigarettes. If you need help quitting, ask your doctor. Contact a doctor if:  You are getting worse, not better.  Your symptoms are not helped by medicine.  You have chills.  You are getting more short of breath.  You have brown or red mucus.  You have yellow or brown discharge from your nose.  You have pain in your face, especially when you bend forward.  You have a fever.  You have puffy (swollen) neck glands.  You have pain while swallowing.  You have white areas in the back of your throat. Get help right away if:  You have very bad or constant: ? Headache. ? Ear pain. ? Pain in your forehead, behind your eyes, and over your cheekbones (sinus pain). ? Chest pain.  You have long-lasting (chronic) lung disease and any of the following: ? Wheezing. ? Long-lasting cough. ? Coughing up blood. ? A change in your usual mucus.  You have a stiff neck.  You have changes in your: ? Vision. ? Hearing. ? Thinking. ? Mood. This information is not intended to replace advice given to you by your health care provider. Make   sure you discuss any questions you have with your health care provider. Document Released: 03/27/2008 Document Revised: 06/11/2016 Document Reviewed: 01/14/2014 Elsevier Interactive Patient Education  2018 Elsevier Inc.   

## 2017-12-04 ENCOUNTER — Ambulatory Visit
Admission: RE | Admit: 2017-12-04 | Discharge: 2017-12-04 | Disposition: A | Discharge status: Home / Self Care | Payer: Medicare Other | Source: Ambulatory Visit | Attending: Family Medicine | Admitting: Family Medicine

## 2017-12-04 ENCOUNTER — Ambulatory Visit (INDEPENDENT_AMBULATORY_CARE_PROVIDER_SITE_OTHER): Payer: Medicare Other | Admitting: Family Medicine

## 2017-12-04 ENCOUNTER — Encounter: Payer: Self-pay | Admitting: Family Medicine

## 2017-12-04 VITALS — BP 122/84 | Temp 97.9°F | Resp 14 | Ht 66.0 in | Wt 153.6 lb

## 2017-12-04 DIAGNOSIS — M4312 Spondylolisthesis, cervical region: Secondary | ICD-10-CM | POA: Diagnosis not present

## 2017-12-04 DIAGNOSIS — M542 Cervicalgia: Secondary | ICD-10-CM | POA: Diagnosis not present

## 2017-12-04 DIAGNOSIS — M25561 Pain in right knee: Secondary | ICD-10-CM | POA: Insufficient documentation

## 2017-12-04 DIAGNOSIS — Z23 Encounter for immunization: Secondary | ICD-10-CM | POA: Diagnosis not present

## 2017-12-04 DIAGNOSIS — M47812 Spondylosis without myelopathy or radiculopathy, cervical region: Secondary | ICD-10-CM | POA: Insufficient documentation

## 2017-12-04 MED ORDER — TIZANIDINE HCL 4 MG PO CAPS: 4.0000 mg | ORAL_CAPSULE | Freq: Two times a day (BID) | ORAL | 0 refills | Status: DC | PRN

## 2017-12-04 NOTE — Progress Notes (Signed)
Name: [name]   MRN: [id number]    DOB: [birth year]   Date:12/04/2017       Progress Note  Subjective  Chief Complaint  Chief Complaint  Patient presents with  . Follow-up    3 months   . Neck Pain    back of her neck. Onset years ago. Pt states she had injections in her neck for years last one was 2004 and now its bothering her more .  Marland Kitchen Leg Pain    right leg; tingling in right leg ad hands    Neck Pain   This is a recurrent (had first episode in 2004, received injections to her neck, now its starting to bother her again.) problem. The problem occurs constantly. The pain is present in the right side. The quality of the pain is described as aching. The pain is moderate. Exacerbated by: low pressure weather makes it worse, working in her yard made it worse as well. Associated symptoms include leg pain (right leg numbness, has been going on fro last 3 months.). Pertinent negatives include no trouble swallowing or visual change. She has tried muscle relaxants, bed rest and acetaminophen for the symptoms.     Past Medical History:  Diagnosis Date  . Bronchitis   . DJD (degenerative joint disease), cervical   . Hyperlipidemia   . Hypertension   . Hypothyroid   . LBBB (left bundle branch block)   . SVT (supraventricular tachycardia) (HCC)    a. s/p ablation by Dr Lovena Le 5/13    Past Surgical History:  Procedure Laterality Date  . ELECTROPHYSIOLOGY STUDY N/A 02/27/2012   Procedure: ELECTROPHYSIOLOGY STUDY;  Surgeon: Evans Lance, MD;  Location: [facility] CATH LAB;  Service: Cardiovascular;  Laterality: N/A;  . EPS and ablation for SVT  5/13   slow pathway ablation by Dr Lovena Le  . EYE SURGERY     surgery for detatched retina  . SUPRAVENTRICULAR TACHYCARDIA ABLATION N/A 02/27/2012   Procedure: SUPRAVENTRICULAR TACHYCARDIA ABLATION;  Surgeon: Evans Lance, MD;  Location: [facility] CATH LAB;  Service: Cardiovascular;  Laterality: N/A;    Family History  Problem Relation Age of Onset  .  Alzheimer's disease Mother   . Cancer Father   . Stroke Father   . Cancer Paternal Aunt   . Thyroid disease Maternal Grandmother     Social History   Socioeconomic History  . Marital status: Married    Spouse name: Not on file  . Number of children: Not on file  . Years of education: Not on file  . Highest education level: Not on file  Social Needs  . Financial resource strain: Not on file  . Food insecurity - worry: Not on file  . Food insecurity - inability: Not on file  . Transportation needs - medical: Not on file  . Transportation needs - non-medical: Not on file  Occupational History  . Not on file  Tobacco Use  . Smoking status: Never Smoker  . Smokeless tobacco: Never Used  Substance and Sexual Activity  . Alcohol use: No  . Drug use: No  . Sexual activity: Not on file  Other Topics Concern  . Not on file  Social History Narrative   Lives in [place]     Current Outpatient Medications:  .  acetaminophen (TYLENOL) 325 MG tablet, Take 650 mg by mouth every 6 (six) hours as needed. For pain, Disp: , Rfl:  .  amLODipine (NORVASC) 2.5 MG tablet, TAKE 1 TABLET(2.5 MG) BY MOUTH  DAILY, Disp: 90 tablet, Rfl: 0 .  aspirin EC 81 MG tablet, Take 162 mg by mouth daily. , Disp: , Rfl:  .  atorvastatin (LIPITOR) 20 MG tablet, TAKE 1 TABLET(20 MG) BY MOUTH DAILY AT 6 PM, Disp: 90 tablet, Rfl: 0 .  busPIRone (BUSPAR) 5 MG tablet, TAKE 1 TABLET(5 MG) BY MOUTH TWICE DAILY, Disp: 180 tablet, Rfl: 0 .  cholecalciferol (VITAMIN D) 1000 UNITS tablet, Take 2,000 Units by mouth daily., Disp: , Rfl:  .  levothyroxine (SYNTHROID, LEVOTHROID) 137 MCG tablet, TAKE 1 TABLET(137 MCG) BY MOUTH EVERY MORNING, Disp: 90 tablet, Rfl: 0 .  losartan-hydrochlorothiazide (HYZAAR) 100-12.5 MG tablet, TAKE 1 TABLET BY MOUTH DAILY, Disp: 90 tablet, Rfl: 0 .  metoprolol succinate (TOPROL-XL) 50 MG 24 hr tablet, TAKE 1 TABLET BY MOUTH DAILY. TAKE WITH OR IMMEDIATELY FOLLOWING A MEAL, Disp: 90 tablet, Rfl:  0 .  Multiple Vitamin (MULITIVITAMIN WITH MINERALS) TABS, Take 1 tablet by mouth daily., Disp: , Rfl:  .  potassium chloride (MICRO-K) 10 MEQ CR capsule, TAKE 1 CAPSULE(10 MEQ) BY MOUTH DAILY, Disp: 90 capsule, Rfl: 0 .  fexofenadine (ALLEGRA ALLERGY) 180 MG tablet, Take 1 tablet (180 mg total) by mouth daily. (Patient not taking: Reported on 12/04/2017), Disp: , Rfl:  .  fluticasone (FLONASE) 50 MCG/ACT nasal spray, Place 2 sprays into both nostrils daily. (Patient not taking: Reported on 12/04/2017), Disp: 16 g, Rfl: 0 .  promethazine-dextromethorphan (PROMETHAZINE-DM) 6.25-15 MG/5ML syrup, Take 5 mLs by mouth 4 (four) times daily as needed for cough. (Patient not taking: Reported on 12/04/2017), Disp: 180 mL, Rfl: 0  Allergies  Allergen Reactions  . Codeine Nausea And Vomiting  . Erythromycin Swelling  . Penicillins Swelling  . Septra [Sulfamethoxazole-Trimethoprim] Swelling     Review of Systems  HENT: Negative for trouble swallowing.   Musculoskeletal: Positive for neck pain.     Objective  Vitals:   12/04/17 0820  BP: 122/84  Resp: 14  Temp: 97.9 F (36.6 C)  TempSrc: Oral  SpO2: 97%  Weight: 153 lb 9.6 oz (69.7 kg)  Height: 5\' 6"  (1.676 m)    Physical Exam  Constitutional: She is oriented to person, place, and time and well-developed, well-nourished, and in no distress.  Cardiovascular: Normal rate, regular rhythm and normal heart sounds.  No murmur heard. Pulmonary/Chest: Effort normal and breath sounds normal. She has no wheezes.  Musculoskeletal:       Right knee: She exhibits swelling. She exhibits no erythema. Tenderness found. Lateral joint line tenderness noted.       Cervical back: She exhibits tenderness, pain and spasm.       Back:       Legs: Neurological: She is alert and oriented to person, place, and time.  Nursing note and vitals reviewed.    Assessment & Plan  1. Need for 23-polyvalent pneumococcal polysaccharide vaccine  - Pneumococcal  polysaccharide vaccine 23-valent greater than or equal to 2yo subcutaneous/IM  2. Neck pain Suspect osteoarthritis, obtain x-rays of cervical spine, start on muscle relaxer - DG Cervical Spine Complete; Future - tiZANidine (ZANAFLEX) 4 MG capsule; Take 1 capsule (4 mg total) by mouth 2 (two) times daily as needed for up to 10 days for muscle spasms.  Dispense: 20 capsule; Refill: 0  3. Acute pain of right knee  - DG Knee Complete 4 Views Right; Future   Nasire Reali [facility] 12/04/2017 8:38 AM

## 2017-12-06 ENCOUNTER — Other Ambulatory Visit: Payer: Self-pay | Admitting: Family Medicine

## 2017-12-06 DIAGNOSIS — J069 Acute upper respiratory infection, unspecified: Secondary | ICD-10-CM

## 2017-12-07 ENCOUNTER — Other Ambulatory Visit: Payer: Self-pay | Admitting: Family Medicine

## 2017-12-07 DIAGNOSIS — M4712 Other spondylosis with myelopathy, cervical region: Secondary | ICD-10-CM

## 2017-12-10 ENCOUNTER — Other Ambulatory Visit: Payer: Self-pay

## 2017-12-10 DIAGNOSIS — M62838 Other muscle spasm: Secondary | ICD-10-CM

## 2017-12-10 MED ORDER — TIZANIDINE HCL 4 MG PO TABS: ORAL_TABLET | ORAL | 0 refills | Status: DC

## 2017-12-10 NOTE — Telephone Encounter (Signed)
Pharmacy sends incoming request for tizanidine capsules to be changed to tablets. Per Dr.Shah orders changed, new rx sent in.

## 2017-12-12 ENCOUNTER — Other Ambulatory Visit: Payer: Self-pay

## 2017-12-13 MED ORDER — VALSARTAN-HYDROCHLOROTHIAZIDE 160-12.5 MG PO TABS: 1.0000 | ORAL_TABLET | Freq: Every day | ORAL | 1 refills | Status: DC

## 2017-12-19 ENCOUNTER — Ambulatory Visit (INDEPENDENT_AMBULATORY_CARE_PROVIDER_SITE_OTHER): Payer: Medicare Other | Admitting: Family Medicine

## 2017-12-19 ENCOUNTER — Encounter: Payer: Self-pay | Admitting: Family Medicine

## 2017-12-19 VITALS — BP 160/90 | HR 68 | Resp 14 | Ht 66.0 in | Wt 151.2 lb

## 2017-12-19 DIAGNOSIS — I6523 Occlusion and stenosis of bilateral carotid arteries: Secondary | ICD-10-CM | POA: Diagnosis not present

## 2017-12-19 DIAGNOSIS — I4891 Unspecified atrial fibrillation: Secondary | ICD-10-CM | POA: Diagnosis not present

## 2017-12-19 DIAGNOSIS — I499 Cardiac arrhythmia, unspecified: Secondary | ICD-10-CM | POA: Diagnosis not present

## 2017-12-19 DIAGNOSIS — I10 Essential (primary) hypertension: Secondary | ICD-10-CM

## 2017-12-19 DIAGNOSIS — M5412 Radiculopathy, cervical region: Secondary | ICD-10-CM

## 2017-12-19 MED ORDER — RIVAROXABAN 20 MG PO TABS: 20.0000 mg | ORAL_TABLET | Freq: Every day | ORAL | 0 refills | Status: DC

## 2017-12-19 NOTE — Progress Notes (Signed)
Name: [name]   MRN: [id number]    DOB: 1941/08/16   Date:12/19/2017       Progress Note  Subjective  Chief Complaint  Chief Complaint  Patient presents with  . Hypothyroidism  . Hypertension  . Hyperlipidemia    HPI  Carotid calcification: found on x-ray of neck. She also had a vascular study done back in 2011 that showed mild plaque formation. She is on statin therapy and aspirin, last LDL was 87. Discussed going up on statin therapy to get LDL below 70, but she would like to hold off on adjusting dose at this time. She denies headache or dizziness, she states she has balance problems - but states secondary to visual disturbance ( she can see better from left eye than right).   Cervical radiculitis: seeing neurosurgeon at [facility] finishing medrol dose pain, still has neck pain and some pain that radiates down to right shoulder, described as aching, also occasional tingling on fingers. Also getting evaluated for lower back pain because on right leg tingling, also improving with medrol dose pain.   HTN: bp at home usually controlled but up during visit today, she states she was nervous to see a new physician, no chest pain or palpitation. She is compliant with her medications   Patient Active Problem List   Diagnosis Date Noted  . Carotid artery calcification, bilateral 12/19/2017  . Cervical radiculitis 12/19/2017  . Hyperglycemia 03/06/2016  . Hypothyroid 04/21/2015  . 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 Anxiety 04/21/2015  . Hyperlipidemia 04/21/2015  . Diuretic-induced hypokalemia 04/21/2015  . Palpitations 12/18/2012  . SVT (supraventricular tachycardia) ([place]) 05/27/2012  . Hypertension 05/27/2012    Past Surgical History:  Procedure Laterality Date  . ELECTROPHYSIOLOGY STUDY N/A 02/27/2012   Procedure: ELECTROPHYSIOLOGY STUDY;  Surgeon: Evans Lance, MD;  Location: [facility] CATH LAB;  Service: Cardiovascular;  Laterality: N/A;  .  EPS and ablation for SVT  5/13   slow pathway ablation by Dr Lovena Le  . EYE SURGERY     surgery for detatched retina  . SUPRAVENTRICULAR TACHYCARDIA ABLATION N/A 02/27/2012   Procedure: SUPRAVENTRICULAR TACHYCARDIA ABLATION;  Surgeon: Evans Lance, MD;  Location: [facility] CATH LAB;  Service: Cardiovascular;  Laterality: N/A;    Family History  Problem Relation Age of Onset  . Alzheimer's disease Mother   . Cancer Father   . Stroke Father   . Cancer Paternal Aunt   . Thyroid disease Maternal Grandmother     Social History   Socioeconomic History  . Marital status: Married    Spouse name: Not on file  . Number of children: Not on file  . Years of education: Not on file  . Highest education level: Not on file  Social Needs  . Financial resource strain: Not on file  . Food insecurity - worry: Not on file  . Food insecurity - inability: Not on file  . Transportation needs - medical: Not on file  . Transportation needs - non-medical: Not on file  Occupational History  . Not on file  Tobacco Use  . Smoking status: Never Smoker  . Smokeless tobacco: Never Used  Substance and Sexual Activity  . Alcohol use: No  . Drug use: No  . Sexual activity: Not on file  Other Topics Concern  . Not on file  Social History Narrative   Lives in [place]     Current Outpatient Medications:  .  acetaminophen (TYLENOL) 325 MG tablet, Take  650 mg by mouth every 6 (six) hours as needed. For pain, Disp: , Rfl:  .  amLODipine (NORVASC) 2.5 MG tablet, TAKE 1 TABLET(2.5 MG) BY MOUTH DAILY, Disp: 90 tablet, Rfl: 0 .  aspirin EC 81 MG tablet, Take 162 mg by mouth daily. , Disp: , Rfl:  .  atorvastatin (LIPITOR) 20 MG tablet, TAKE 1 TABLET(20 MG) BY MOUTH DAILY AT 6 PM, Disp: 90 tablet, Rfl: 0 .  busPIRone (BUSPAR) 5 MG tablet, TAKE 1 TABLET(5 MG) BY MOUTH TWICE DAILY, Disp: 180 tablet, Rfl: 0 .  cholecalciferol (VITAMIN D) 1000 UNITS tablet, Take 2,000 Units by mouth daily., Disp: , Rfl:  .   fexofenadine (ALLEGRA ALLERGY) 180 MG tablet, Take 1 tablet (180 mg total) by mouth daily., Disp: , Rfl:  .  levothyroxine (SYNTHROID, LEVOTHROID) 137 MCG tablet, TAKE 1 TABLET(137 MCG) BY MOUTH EVERY MORNING, Disp: 90 tablet, Rfl: 0 .  metoprolol succinate (TOPROL-XL) 50 MG 24 hr tablet, TAKE 1 TABLET BY MOUTH DAILY. TAKE WITH OR IMMEDIATELY FOLLOWING A MEAL, Disp: 90 tablet, Rfl: 0 .  Multiple Vitamin (MULITIVITAMIN WITH MINERALS) TABS, Take 1 tablet by mouth daily., Disp: , Rfl:  .  potassium chloride (MICRO-K) 10 MEQ CR capsule, TAKE 1 CAPSULE(10 MEQ) BY MOUTH DAILY, Disp: 90 capsule, Rfl: 0 .  tiZANidine (ZANAFLEX) 4 MG tablet, Take 1 tablet by mouth twice daily for up to 10 days as needed for muscle spasms, Disp: 20 tablet, Rfl: 0 .  valsartan-hydrochlorothiazide (DIOVAN HCT) 160-12.5 MG tablet, Take 1 tablet by mouth daily. In place of losartan because of recall, Disp: 90 tablet, Rfl: 1  Allergies  Allergen Reactions  . Codeine Nausea And Vomiting  . Erythromycin Swelling  . Penicillins Swelling  . Septra [Sulfamethoxazole-Trimethoprim] Swelling     ROS  Ten systems reviewed and is negative except as mentioned in HPI   Objective  Vitals:   12/19/17 1340  BP: (!) 160/80  Pulse: 68  Resp: 14  SpO2: 98%  Weight: 151 lb 3.2 oz (68.6 kg)  Height: 5\' 6"  (1.676 m)    Body mass index is 24.4 kg/m.  Physical Exam  Constitutional: Patient appears well-developed and well-nourished.  No distress.  HEENT: head atraumatic, normocephalic, pupils equal and reactive to light,  neck supple, but has decrease rom , throat within normal limits Cardiovascular:  Irregular rate and  rhythm and normal heart sounds.  No murmur heard. No BLE edema. Pulmonary/Chest: Effort normal and breath sounds normal. No respiratory distress. Abdominal: Soft.  There is no tenderness. Psychiatric: Patient has a normal mood and affect. behavior is normal. Judgment and thought content  normal.  PHQ2/9: Depression screen [facility] 2/9 12/04/2017 09/03/2017 05/17/2017 03/12/2017 06/06/2016  Decreased Interest 0 0 0 0 0  Down, Depressed, Hopeless 0 0 0 1 0  PHQ - 2 Score 0 0 0 1 0     Fall Risk: Fall Risk  12/19/2017 12/04/2017 09/03/2017 05/17/2017 03/12/2017  Falls in the past year? No No No No No  Number falls in past yr: - - - - -  Injury with Fall? - - - - -  [place] for fall due to : - - - - -  Risk for fall due to: Comment - - - - -  Follow up - - - - -     Functional Status Survey: Is the patient deaf or have difficulty hearing?: No Does the patient have difficulty seeing, even when wearing glasses/contacts?:  No Does the patient have difficulty concentrating, remembering, or making decisions?: No Does the patient have difficulty walking or climbing stairs?: No Does the patient have difficulty dressing or bathing?: No Does the patient have difficulty doing errands alone such as visiting a doctor's office or shopping?: No   Assessment & Plan   1. Carotid artery calcification, bilateral  - Ambulatory referral to Vascular Surgery  2. Cervical radiculitis  Going to [facility], but states they are out of network and if she needs surgery she will have to go to [facility]  3. Essential hypertension  She also has anxiety, advised buspar before visit next time  4. Irregular heart beats  - EKG 12-Lead  5. Atrial fibrillation, new onset (HCC)  - rivaroxaban (XARELTO) 20 MG TABS tablet; Take 1 tablet (20 mg total) by mouth daily with supper.  Dispense: 30 tablet; Refill: 0 - Ambulatory referral to Cardiology  Hold aspirin until seen by cardiologist

## 2017-12-19 NOTE — Patient Instructions (Signed)
Atrial Fibrillation Atrial fibrillation is a type of irregular or rapid heartbeat (arrhythmia). In atrial fibrillation, the heart quivers continuously in a chaotic pattern. This occurs when parts of the heart receive disorganized signals that make the heart unable to pump blood normally. This can increase the risk for stroke, heart failure, and other heart-related conditions. There are different types of atrial fibrillation, including:  Paroxysmal atrial fibrillation. This type starts suddenly, and it usually stops on its own shortly after it starts.  Persistent atrial fibrillation. This type often lasts longer than a week. It may stop on its own or with treatment.  Long-lasting persistent atrial fibrillation. This type lasts longer than 12 months.  Permanent atrial fibrillation. This type does not go away.  Talk with your health care provider to learn about the type of atrial fibrillation that you have. What are the causes? This condition is caused by some heart-related conditions or procedures, including:  A heart attack.  Coronary artery disease.  Heart failure.  Heart valve conditions.  High blood pressure.  Inflammation of the sac that surrounds the heart (pericarditis).  Heart surgery.  Certain heart rhythm disorders, such as Wolf-Parkinson-White syndrome.  Other causes include:  Pneumonia.  Obstructive sleep apnea.  Blockage of an artery in the lungs (pulmonary embolism, or PE).  Lung cancer.  Chronic lung disease.  Thyroid problems, especially if the thyroid is overactive (hyperthyroidism).  Caffeine.  Excessive alcohol use or illegal drug use.  Use of some medicines, including certain decongestants and diet pills.  Sometimes, the cause cannot be found. What increases the risk? This condition is more likely to develop in:  People who are older in age.  People who smoke.  People who have diabetes mellitus.  People who are overweight  (obese).  Athletes who exercise vigorously.  What are the signs or symptoms? Symptoms of this condition include:  A feeling that your heart is beating rapidly or irregularly.  A feeling of discomfort or pain in your chest.  Shortness of breath.  Sudden light-headedness or weakness.  Getting tired easily during exercise.  In some cases, there are no symptoms. How is this diagnosed? Your health care provider may be able to detect atrial fibrillation when taking your pulse. If detected, this condition may be diagnosed with:  An electrocardiogram (ECG).  A Holter monitor test that records your heartbeat patterns over a 24-hour period.  Transthoracic echocardiogram (TTE) to evaluate how blood flows through your heart.  Transesophageal echocardiogram (TEE) to view more detailed images of your heart.  A stress test.  Imaging tests, such as a CT scan or chest X-ray.  Blood tests.  How is this treated? The main goals of treatment are to prevent blood clots from forming and to keep your heart beating at a normal rate and rhythm. The type of treatment that you receive depends on many factors, such as your underlying medical conditions and how you feel when you are experiencing atrial fibrillation. This condition may be treated with:  Medicine to slow down the heart rate, bring the heart's rhythm back to normal, or prevent clots from forming.  Electrical cardioversion. This is a procedure that resets your heart's rhythm by delivering a controlled, low-energy shock to the heart through your skin.  Different types of ablation, such as catheter ablation, catheter ablation with pacemaker, or surgical ablation. These procedures destroy the heart tissues that send abnormal signals. When the pacemaker is used, it is placed under your skin to help your heart beat in   a regular rhythm.  Follow these instructions at home:  Take over-the counter and prescription medicines only as told by your  health care provider.  If your health care provider prescribed a blood-thinning medicine (anticoagulant), take it exactly as told. Taking too much blood-thinning medicine can cause bleeding. If you do not take enough blood-thinning medicine, you will not have the protection that you need against stroke and other problems.  Do not use tobacco products, including cigarettes, chewing tobacco, and e-cigarettes. If you need help quitting, ask your health care provider.  If you have obstructive sleep apnea, manage your condition as told by your health care provider.  Do not drink alcohol.  Do not drink beverages that contain caffeine, such as coffee, soda, and tea.  Maintain a healthy weight. Do not use diet pills unless your health care provider approves. Diet pills may make heart problems worse.  Follow diet instructions as told by your health care provider.  Exercise regularly as told by your health care provider.  Keep all follow-up visits as told by your health care provider. This is important. How is this prevented?  Avoid drinking beverages that contain caffeine or alcohol.  Avoid certain medicines, especially medicines that are used for breathing problems.  Avoid certain herbs and herbal medicines, such as those that contain ephedra or ginseng.  Do not use illegal drugs, such as cocaine and amphetamines.  Do not smoke.  Manage your high blood pressure. Contact a health care provider if:  You notice a change in the rate, rhythm, or strength of your heartbeat.  You are taking an anticoagulant and you notice increased bruising.  You tire more easily when you exercise or exert yourself. Get help right away if:  You have chest pain, abdominal pain, sweating, or weakness.  You feel nauseous.  You notice blood in your vomit, bowel movement, or urine.  You have shortness of breath.  You suddenly have swollen feet and ankles.  You feel dizzy.  You have sudden weakness or  numbness of the face, arm, or leg, especially on one side of the body.  You have trouble speaking, trouble understanding, or both (aphasia).  Your face or your eyelid droops on one side. These symptoms may represent a serious problem that is an emergency. Do not wait to see if the symptoms will go away. Get medical help right away. Call your local emergency services (911 in the [place] Do not drive yourself to the hospital. This information is not intended to replace advice given to you by your health care provider. Make sure you discuss any questions you have with your health care provider. Document Released: 10/09/2005 Document Revised: 02/16/2016 Document Reviewed: 02/03/2015 Elsevier Interactive Patient Education  2018 [employer]  

## 2018-01-03 ENCOUNTER — Ambulatory Visit (INDEPENDENT_AMBULATORY_CARE_PROVIDER_SITE_OTHER): Payer: Medicare Other | Admitting: Vascular Surgery

## 2018-01-03 ENCOUNTER — Encounter (INDEPENDENT_AMBULATORY_CARE_PROVIDER_SITE_OTHER): Payer: Self-pay | Admitting: Vascular Surgery

## 2018-01-03 VITALS — BP 169/66 | HR 76 | Resp 17 | Ht 66.0 in | Wt 150.4 lb

## 2018-01-03 DIAGNOSIS — M79605 Pain in left leg: Secondary | ICD-10-CM | POA: Diagnosis not present

## 2018-01-03 DIAGNOSIS — I4891 Unspecified atrial fibrillation: Secondary | ICD-10-CM

## 2018-01-03 DIAGNOSIS — I6523 Occlusion and stenosis of bilateral carotid arteries: Secondary | ICD-10-CM | POA: Diagnosis not present

## 2018-01-03 DIAGNOSIS — M79604 Pain in right leg: Secondary | ICD-10-CM

## 2018-01-03 DIAGNOSIS — E782 Mixed hyperlipidemia: Secondary | ICD-10-CM

## 2018-01-03 DIAGNOSIS — I739 Peripheral vascular disease, unspecified: Secondary | ICD-10-CM | POA: Diagnosis not present

## 2018-01-04 ENCOUNTER — Encounter (INDEPENDENT_AMBULATORY_CARE_PROVIDER_SITE_OTHER): Payer: Self-pay | Admitting: Vascular Surgery

## 2018-01-04 DIAGNOSIS — M79606 Pain in leg, unspecified: Secondary | ICD-10-CM | POA: Insufficient documentation

## 2018-01-04 DIAGNOSIS — I739 Peripheral vascular disease, unspecified: Secondary | ICD-10-CM | POA: Insufficient documentation

## 2018-01-04 NOTE — Progress Notes (Signed)
MRN : [id number]  [name] is a 77 y.o. (1941-05-12) female who presents with chief complaint of  Chief Complaint  Patient presents with  . New Patient (Initial Visit)    ref [facility] for Carotid   .  History of Present Illness: The patient is seen for evaluation of carotid stenosis. The carotid stenosis was identified after  A bruit was appreciated on exam.  The patient denies amaurosis fugax. There is no recent history of TIA symptoms or focal motor deficits. There is no prior documented CVA.  There is no history of migraine headaches. There is no history of seizures.  The patient is also c/o of painful lower extremities. Patient notes the pain is variable and not always associated with activity.  The pain is somewhat consistent day to day occurring on most days. The patient notes the pain also occurs with standing and routinely seems worse as the day wears on. The pain has been progressive over the past several years. The patient states these symptoms are causing  a profound negative impact on quality of life and daily activities.  The patient denies rest pain or dangling of an extremity off the side of the bed during the night for relief. No open wounds or sores at this time.  The patient is taking enteric-coated aspirin 81 mg daily.  The patient has a history of coronary artery disease, no recent episodes of angina or shortness of breath. There is a history of hyperlipidemia which is being treated with a statin.    Current Meds  Medication Sig  . acetaminophen (TYLENOL) 325 MG tablet Take 650 mg by mouth every 6 (six) hours as needed. For pain  . amLODipine (NORVASC) 2.5 MG tablet TAKE 1 TABLET(2.5 MG) BY MOUTH DAILY  . atorvastatin (LIPITOR) 20 MG tablet TAKE 1 TABLET(20 MG) BY MOUTH DAILY AT 6 PM  . busPIRone (BUSPAR) 5 MG tablet TAKE 1 TABLET(5 MG) BY MOUTH TWICE DAILY  . cholecalciferol (VITAMIN D) 1000 UNITS tablet Take 2,000 Units by mouth daily.  . fexofenadine  (ALLEGRA ALLERGY) 180 MG tablet Take 1 tablet (180 mg total) by mouth daily.  Marland Kitchen levothyroxine (SYNTHROID, LEVOTHROID) 137 MCG tablet TAKE 1 TABLET(137 MCG) BY MOUTH EVERY MORNING  . metoprolol succinate (TOPROL-XL) 50 MG 24 hr tablet TAKE 1 TABLET BY MOUTH DAILY. TAKE WITH OR IMMEDIATELY FOLLOWING A MEAL  . Multiple Vitamin (MULITIVITAMIN WITH MINERALS) TABS Take 1 tablet by mouth daily.  . potassium chloride (MICRO-K) 10 MEQ CR capsule TAKE 1 CAPSULE(10 MEQ) BY MOUTH DAILY  . rivaroxaban (XARELTO) 20 MG TABS tablet Take 1 tablet (20 mg total) by mouth daily with supper.  Marland Kitchen tiZANidine (ZANAFLEX) 4 MG tablet Take 1 tablet by mouth twice daily for up to 10 days as needed for muscle spasms  . valsartan-hydrochlorothiazide (DIOVAN HCT) 160-12.5 MG tablet Take 1 tablet by mouth daily. In place of losartan because of recall    Past Medical History:  Diagnosis Date  . Bronchitis   . DJD (degenerative joint disease), cervical   . Hyperlipidemia   . Hypertension   . Hypothyroid   . LBBB (left bundle branch block)   . SVT (supraventricular tachycardia) (HCC)    a. s/p ablation by Dr Lovena Le 5/13    Past Surgical History:  Procedure Laterality Date  . ELECTROPHYSIOLOGY STUDY N/A 02/27/2012   Procedure: ELECTROPHYSIOLOGY STUDY;  Surgeon: Evans Lance, MD;  Location: [facility] CATH LAB;  Service: Cardiovascular;  Laterality: N/A;  . EPS  and ablation for SVT  5/13   slow pathway ablation by Dr Lovena Le  . EYE SURGERY     surgery for detatched retina  . SUPRAVENTRICULAR TACHYCARDIA ABLATION N/A 02/27/2012   Procedure: SUPRAVENTRICULAR TACHYCARDIA ABLATION;  Surgeon: Evans Lance, MD;  Location: [facility] CATH LAB;  Service: Cardiovascular;  Laterality: N/A;    Social History Social History   Tobacco Use  . Smoking status: Never Smoker  . Smokeless tobacco: Never Used  Substance Use Topics  . Alcohol use: No  . Drug use: No    Family History Family History  Problem Relation Age of Onset  . Alzheimer's  disease Mother   . Cancer Father   . Stroke Father   . Cancer Paternal Aunt   . Thyroid disease Maternal Grandmother   No family history of bleeding/clotting disorders, porphyria or autoimmune disease   Allergies  Allergen Reactions  . Codeine Nausea And Vomiting  . Erythromycin Swelling  . Penicillins Swelling  . Septra [Sulfamethoxazole-Trimethoprim] Swelling     REVIEW OF SYSTEMS (Negative unless checked)  Constitutional: [] Weight loss  [] Fever  [] Chills Cardiac: [] Chest pain   [] Chest pressure   [] Palpitations   [] Shortness of breath when laying flat   [] Shortness of breath with exertion. Vascular:  [x] Pain in legs with walking   [] Pain in legs at rest  [] History of DVT   [] Phlebitis   [] Swelling in legs   [] Varicose veins   [] Non-healing ulcers Pulmonary:   [] Uses home oxygen   [] Productive cough   [] Hemoptysis   [] Wheeze  [] COPD   [] Asthma Neurologic:  [x] Dizziness   [] Seizures   [] History of stroke   [] History of TIA  [] Aphasia   [] Vissual changes   [] Weakness or numbness in arm   [] Weakness or numbness in leg Musculoskeletal:   [] Joint swelling   [] Joint pain   [] Low back pain Hematologic:  [] Easy bruising  [] Easy bleeding   [] Hypercoagulable state   [] Anemic Gastrointestinal:  [] Diarrhea   [] Vomiting  [] Gastroesophageal reflux/heartburn   [] Difficulty swallowing. Genitourinary:  [] Chronic kidney disease   [] Difficult urination  [] Frequent urination   [] Blood in urine Skin:  [] Rashes   [] Ulcers  Psychological:  [] History of anxiety   []  History of major depression.  Physical Examination  Vitals:   01/03/18 0928  BP: (!) 169/66  Pulse: 76  Resp: 17  Weight: 150 lb 6.4 oz (68.2 kg)  Height: 5\' 6"  (1.676 m)   Body mass index is 24.28 kg/m. Gen: WD/WN, NAD Head: [place]/AT, No temporalis wasting.  Ear/Nose/Throat: Hearing grossly intact, nares w/o erythema or drainage, poor dentition Eyes: PER, EOMI, sclera nonicteric.  Neck: Supple, no masses.  No bruit or JVD.    Pulmonary:  Good air movement, clear to auscultation bilaterally, no use of accessory muscles.  Cardiac: RRR, normal S1, S2, no Murmurs. Vascular:  Bilateral carotid bruits left >right Vessel Right Left  Radial Palpable Palpable  Brachial Palpable Palpable  Carotid Palpable Palpable  PT Not Palpable Not Palpable  DP Trace Palpable Trace Palpable  Gastrointestinal: soft, non-distended. No guarding/no peritoneal signs.  Musculoskeletal: M/S 5/5 throughout.  No deformity or atrophy.  Neurologic: CN 2-12 intact. Pain and light touch intact in extremities.  Symmetrical.  Speech is fluent. Motor exam as listed above. Psychiatric: Judgment intact, Mood & affect appropriate for pt's clinical situation. Dermatologic: No rashes or ulcers noted.  No changes consistent with cellulitis. Lymph : No Cervical lymphadenopathy, no lichenification or skin changes of chronic lymphedema.  CBC Lab Results  Component Value Date   WBC 7.1 05/17/2017   HGB 12.9 05/17/2017   HCT 38.4 05/17/2017   MCV 95.5 05/17/2017   PLT 230 05/17/2017    BMET    Component Value Date/Time   NA 139 12/08/2016 0924   NA 141 06/06/2016 0907   NA 143 02/24/2012 0112   K 4.6 12/08/2016 0924   K 3.4 (L) 02/24/2012 0112   CL 100 12/08/2016 0924   CL 109 (H) 02/24/2012 0112   CO2 24 12/08/2016 0924   CO2 27 02/24/2012 0112   GLUCOSE 116 (H) 12/08/2016 0924   GLUCOSE 107 (H) 02/24/2012 0112   BUN 14 12/08/2016 0924   BUN 12 06/06/2016 0907   BUN 18 02/24/2012 0112   CREATININE 0.79 12/08/2016 0924   CALCIUM 9.7 12/08/2016 0924   CALCIUM 8.8 02/24/2012 0112   GFRNONAA 74 12/08/2016 0924   GFRAA 85 12/08/2016 0924   CrCl cannot be calculated (Patient's most recent lab result is older than the maximum 21 days allowed.).  COAG Lab Results  Component Value Date   INR 0.91 02/27/2012   INR 0.9 02/23/2012    Radiology No results found.   Assessment/Plan 1. Carotid artery calcification,  bilateral Recommend:  Given the patient's asymptomatic subcritical stenosis no further invasive testing or surgery at this time.  Duplex ultrasound will be obtained at the patients convenience to assess the degree of stenosis bilaterally.  Continue antiplatelet therapy as prescribed Continue management of CAD, HTN and Hyperlipidemia Healthy heart diet,  encouraged exercise at least 4 times per week Follow up in 1 month with duplex ultrasound and physical exam   - VAS US CAROTID; Future  2. Pain in both lower extremities  Recommend:  The patient has atypical pain symptoms for pure atherosclerotic disease. However, on physical exam there is evidence of mixed venous and arterial disease, given the diminished pulses of the legs.  Noninvasive studies including ABI's of the legs will be obtained and the patient will follow up with me to review these studies.  The patient should continue walking and begin a more formal exercise program. The patient should continue his antiplatelet therapy and aggressive treatment of the lipid abnormalities.  The patient should begin wearing graduated compression socks 15-20 mmHg strength to control edema.  - VAS [place] ABI WITH/WO TBI; Future  3. PAD (peripheral artery disease) ([place]) See #2  - VAS [place] ABI WITH/WO TBI; Future  4. Atrial fibrillation, new onset ([place]) Continue antiarrhythmia medications as already ordered, these medications have been reviewed and there are no changes at this time.  Continue anticoagulation as ordered by Cardiology Service   5. Mixed hyperlipidemia Continue statin as ordered and reviewed, no changes at this time     Hortencia Pilar, MD  01/04/2018 7:58 AM

## 2018-01-06 ENCOUNTER — Other Ambulatory Visit: Payer: Self-pay | Admitting: Family Medicine

## 2018-01-06 DIAGNOSIS — E785 Hyperlipidemia, unspecified: Secondary | ICD-10-CM

## 2018-01-06 DIAGNOSIS — I471 Supraventricular tachycardia: Secondary | ICD-10-CM

## 2018-01-16 ENCOUNTER — Other Ambulatory Visit: Payer: Self-pay | Admitting: Family Medicine

## 2018-01-16 DIAGNOSIS — I4891 Unspecified atrial fibrillation: Secondary | ICD-10-CM

## 2018-01-16 NOTE — Telephone Encounter (Signed)
Refill request for general medication. Xarelto to [facility].  Last office visit: 12/19/2017   Follow up on 02/15/2018

## 2018-01-17 ENCOUNTER — Other Ambulatory Visit: Payer: Self-pay

## 2018-01-17 DIAGNOSIS — I10 Essential (primary) hypertension: Secondary | ICD-10-CM

## 2018-01-17 NOTE — Telephone Encounter (Signed)
Refill request for general medication. Potassium to [facility].  Last office visit: 12/19/2017   Follow up on 02/15/2018

## 2018-01-23 ENCOUNTER — Ambulatory Visit (INDEPENDENT_AMBULATORY_CARE_PROVIDER_SITE_OTHER): Payer: Medicare Other | Admitting: Vascular Surgery

## 2018-01-23 ENCOUNTER — Ambulatory Visit (INDEPENDENT_AMBULATORY_CARE_PROVIDER_SITE_OTHER): Payer: Medicare Other

## 2018-01-23 ENCOUNTER — Encounter (INDEPENDENT_AMBULATORY_CARE_PROVIDER_SITE_OTHER): Payer: Self-pay | Admitting: Vascular Surgery

## 2018-01-23 VITALS — BP 172/74 | HR 86 | Resp 15 | Ht 66.0 in | Wt 150.0 lb

## 2018-01-23 DIAGNOSIS — M79604 Pain in right leg: Secondary | ICD-10-CM

## 2018-01-23 DIAGNOSIS — I6523 Occlusion and stenosis of bilateral carotid arteries: Secondary | ICD-10-CM

## 2018-01-23 DIAGNOSIS — I739 Peripheral vascular disease, unspecified: Secondary | ICD-10-CM | POA: Diagnosis not present

## 2018-01-23 DIAGNOSIS — M79605 Pain in left leg: Secondary | ICD-10-CM | POA: Diagnosis not present

## 2018-01-23 DIAGNOSIS — E782 Mixed hyperlipidemia: Secondary | ICD-10-CM | POA: Diagnosis not present

## 2018-01-23 DIAGNOSIS — R739 Hyperglycemia, unspecified: Secondary | ICD-10-CM | POA: Diagnosis not present

## 2018-01-23 NOTE — Progress Notes (Signed)
Subjective:    Patient ID: [name], female    DOB: 01-21-1941, 76 y.o.   MRN: [id number] Chief Complaint  Patient presents with  . Follow-up    ABI and Carotid f/u   Patient presents to review vascular studies.  Patient was initially seen on January 03, 2018 for evaluation of a newly auscultated left-sided bruit and lower extremity pain.  The patient underwent a bilateral carotid artery duplex which was notable for no evidence of carotid artery stenosis bilaterally there is minimal nonhemodynamically significant plaque noted in the left common carotid artery.  Bilateral vertebral arteries demonstrate antegrade flow.  Normal flow dynamics were seen in the bilateral subclavian arteries the patient also underwent a bilateral ABI which was notable for normal resting ankle-brachial indices bilateral.  No evidence of significant bilateral lower extremity arterial disease.  Triphasic tibials bilaterally.  The patient denies any worsening of [name] symptoms.  The patient denies any fever, nausea vomiting.  Review of Systems  Constitutional: Negative.   HENT: Negative.   Eyes: Negative.   Respiratory: Negative.   Cardiovascular:       Minimal Left carotid bruit noted  Gastrointestinal: Negative.   Endocrine: Negative.   Genitourinary: Negative.   Musculoskeletal: Negative.   Skin: Negative.   Allergic/Immunologic: Negative.   Neurological: Negative.   Hematological: Negative.   Psychiatric/Behavioral: Negative.       Objective:   Physical Exam  Constitutional: [name] is oriented to person, place, and time. [name] appears well-developed and well-nourished. No distress.  HENT:  Head: Normocephalic and atraumatic.  Eyes: Pupils are equal, round, and reactive to light. Conjunctivae are normal.  Neck: Normal range of motion.  Cardiovascular: Normal rate, regular rhythm, normal heart sounds and intact distal pulses.  Pulses:      Radial pulses are 2+ on the right side, and 2+ on the left side.         Dorsalis pedis pulses are 1+ on the right side, and 1+ on the left side.       Posterior tibial pulses are 0 on the right side, and 0 on the left side.  Pulmonary/Chest: Effort normal and breath sounds normal.  Musculoskeletal: Normal range of motion. [name] exhibits no edema.  Neurological: [name] is alert and oriented to person, place, and time.  Skin: Skin is warm and dry. [name] is not diaphoretic.  Psychiatric: [name] has a normal mood and affect. [name] behavior is normal. Judgment and thought content normal.  Vitals reviewed.  BP (!) 172/74 (BP Location: Right Arm, Patient Position: Sitting)   Pulse 86   Resp 15   Ht 5\' 6"  (1.676 m)   Wt 150 lb (68 kg)   BMI 24.21 kg/m   Past Medical History:  Diagnosis Date  . Bronchitis   . DJD (degenerative joint disease), cervical   . Hyperlipidemia   . Hypertension   . Hypothyroid   . LBBB (left bundle branch block)   . SVT (supraventricular tachycardia) (HCC)    a. s/p ablation by Dr Lovena Le 5/13   Social History   Socioeconomic History  . Marital status: Married    Spouse name: Not on file  . Number of children: Not on file  . Years of education: Not on file  . Highest education level: Not on file  Occupational History  . Not on file  Social Needs  . Financial resource strain: Not on file  . Food insecurity:    Worry: Not on file    Inability:  Not on file  . Transportation needs:    Medical: Not on file    Non-medical: Not on file  Tobacco Use  . Smoking status: Never Smoker  . Smokeless tobacco: Never Used  Substance and Sexual Activity  . Alcohol use: No  . Drug use: No  . Sexual activity: Not on file  Lifestyle  . Physical activity:    Days per week: Not on file    Minutes per session: Not on file  . Stress: Not on file  Relationships  . Social connections:    Talks on phone: Not on file    Gets together: Not on file    Attends religious service: Not on file    Active member of club or organization: Not on file     Attends meetings of clubs or organizations: Not on file    Relationship status: Not on file  . Intimate partner violence:    Fear of current or ex partner: Not on file    Emotionally abused: Not on file    Physically abused: Not on file    Forced sexual activity: Not on file  Other Topics Concern  . Not on file  Social History Narrative   L[place] in [place]   Past Surgical History:  Procedure Laterality Date  . ELECTROPHYSIOLOGY STUDY N/A 02/27/2012   Procedure: ELECTROPHYSIOLOGY STUDY;  Surgeon: Evans Lance, MD;  Location[facility];  Service: Cardiovascular;  Laterality: N/A;  . EPS and ablation for SVT  5/13   slow pathway ablation by Dr Lovena Le  . EYE SURGERY     surgery for detatched retina  . SUPRAVENTRICULAR TACHYCARDIA ABLATION N/A 02/27/2012   Procedure: SUPRAVENTRICULAR TACHYCARDIA ABLATION;  Surgeon: Evans Lance, MD;  Location: [facility] CATH LAB;  Service: Cardiovascular;  Laterality: N/A;   Family History  Problem Relation Age of Onset  . Alzheimer's disease Mother   . Cancer Father   . Stroke Father   . Cancer Paternal Aunt   . Thyroid disease Maternal Grandmother    Allergies  Allergen Reactions  . Codeine Nausea And Vomiting  . Erythromycin Swelling  . Penicillins Swelling  . Septra [Sulfamethoxazole-Trimethoprim] Swelling      Assessment & Plan:  Patient presents to review vascular studies.  Patient was initially seen on January 03, 2018 for evaluation of a newly auscultated left-sided bruit and lower extremity pain.  The patient underwent a bilateral carotid artery duplex which was notable for no evidence of carotid artery stenosis bilaterally there is minimal nonhemodynamically significant plaque noted in the left common carotid artery.  Bilateral vertebral arteries demonstrate antegrade flow.  Normal flow dynamics were seen in the bilateral subclavian arteries the patient also underwent a bilateral ABI which was notable for normal resting ankle-brachial  indices bilateral.  No evidence of significant bilateral lower extremity arterial disease.  Triphasic tibials bilaterally.  The patient denies any worsening of [name] symptoms.  The patient denies any fever, nausea vomiting.  1. Carotid artery calcification, bilateral - Stable Patient with minimal nonhemodynamically significant plaque noted in the left common carotid artery. No hemodynamically significant stenosis to the right common carotid artery Recommend follow-up in about 2-3 years with a repeat carotid ultrasound I have discussed with the patient at length the risk factors for and pathogenesis of atherosclerotic disease and encouraged a healthy diet, regular exercise regimen and blood pressure / glucose control.  Patient was instructed to contact our office in the interim with problems such as arm / leg weakness  or numbness, speech / swallowing difficulty or temporary monocular blindness. The patient expresses their understanding  - VAS US CAROTID; Future  2. Pain in both lower extremities - Stable Patient with normal ABI to the bilateral lower extremity Triphasic tibials with no evidence of significant lower extremity arterial disease Recommend follow-up with the patient's primary care physician for further workup with DDD / OA I have discussed with the patient at length the risk factors for and pathogenesis of atherosclerotic disease and encouraged a healthy diet, regular exercise regimen and blood pressure / glucose control.  The patient was encouraged to call the office in the interim if he experiences any claudication like symptoms, rest pain or ulcers to his feet / toes.  3. Mixed hyperlipidemia - Stable Encouraged good control as its slows the progression of atherosclerotic disease  4. Hyperglycemia - Stable Encouraged good control as its slows the progression of atherosclerotic disease  Current Outpatient Medications on File Prior to Visit  Medication Sig Dispense Refill  .  acetaminophen (TYLENOL) 325 MG tablet Take 650 mg by mouth every 6 (six) hours as needed. For pain    . amLODipine (NORVASC) 2.5 MG tablet TAKE 1 TABLET(2.5 MG) BY MOUTH DAILY 90 tablet 0  . aspirin EC 81 MG tablet Take 162 mg by mouth daily.     Marland Kitchen atorvastatin (LIPITOR) 20 MG tablet TAKE 1 TABLET(20 MG) BY MOUTH DAILY AT 6 PM 90 tablet 0  . cholecalciferol (VITAMIN D) 1000 UNITS tablet Take 2,000 Units by mouth daily.    . fexofenadine (ALLEGRA ALLERGY) 180 MG tablet Take 1 tablet (180 mg total) by mouth daily.    Marland Kitchen levothyroxine (SYNTHROID, LEVOTHROID) 137 MCG tablet TAKE 1 TABLET(137 MCG) BY MOUTH EVERY MORNING 90 tablet 0  . metoprolol succinate (TOPROL-XL) 50 MG 24 hr tablet TAKE 1 TABLET BY MOUTH DAILY WITH OR IMMEDIATELY FOLLOWING A MEAL 90 tablet 0  . Multiple Vitamin (MULITIVITAMIN WITH MINERALS) TABS Take 1 tablet by mouth daily.    . potassium chloride (MICRO-K) 10 MEQ CR capsule TAKE 1 CAPSULE(10 MEQ) BY MOUTH DAILY 90 capsule 0  . valsartan-hydrochlorothiazide (DIOVAN HCT) 160-12.5 MG tablet Take 1 tablet by mouth daily. In place of losartan because of recall 90 tablet 1  . XARELTO 20 MG TABS tablet TAKE 1 TABLET BY MOUTH DAILY WITH SUPPER 30 tablet 0  . busPIRone (BUSPAR) 5 MG tablet TAKE 1 TABLET(5 MG) BY MOUTH TWICE DAILY (Patient not taking: Reported on 01/23/2018) 180 tablet 0  . tiZANidine (ZANAFLEX) 4 MG tablet Take 1 tablet by mouth twice daily for up to 10 days as needed for muscle spasms (Patient not taking: Reported on 01/23/2018) 20 tablet 0   No current facility-administered medications on file prior to visit.    There are no Patient Instructions on file for this visit. No follow-ups on file.  Christobal Morado A Yannis Gumbs, PA-C

## 2018-01-25 ENCOUNTER — Other Ambulatory Visit: Payer: Self-pay | Admitting: Family Medicine

## 2018-01-25 MED ORDER — BUSPIRONE HCL 5 MG PO TABS: 5.0000 mg | ORAL_TABLET | Freq: Two times a day (BID) | ORAL | 0 refills | Status: DC

## 2018-01-25 NOTE — Telephone Encounter (Signed)
Copied from [place]. Topic: Quick Communication - See Telephone Encounter >> Jan 25, 2018 11:43 AM Rutherford Nail, NT wrote: CRM for notification. See Telephone encounter for: 01/25/18.  Patient called stating she needs her busPIRone (BUSPAR) 5 MG tablet sent to the pharmacy.   [facility] [place], [place] [phone] (Phone) [phone] (Fax)

## 2018-01-26 NOTE — Progress Notes (Signed)
Cardiology Office Note  Date:  01/29/2018   ID:  [name], DOB 11-Jul-1941, MRN [id number]  PCP:  Steele Sizer, MD   Chief Complaint  Patient presents with  . other    Afib c/o right leg pain. Meds reviewed verbally with pt.    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 Lovena Le to have sinus tachycardia, lost to cardiology follow-up Who presents by referral from Dr. Ancil Boozer for possible atrial fibrillation, abnormal EKG, palpitations  She reports long history of palpitations, tachycardia Typically rate 90-100 bpm.  Previous EP notes recorded heart rate 100-110 at times She is symptomatic, will typically sit until symptoms resolve Present at rest, goes away after several minutes  EKG done in primary care office read as atrial fibrillation  Active at baseline denies any chest pain or shortness of breath on exertion,  No lower extremity edema, no PND orthopnea  She reports having mild carotid stenosis on the left Initially found on x-ray of neck.  vascular study done back in 2011 that showed mild plaque formation. Repeat ultrasound April 2019 with plaque noted,  less than 50%   EKG personally reviewed by myself on todays visit Shows normal sinus rhythm rate 69 bpm left bundle branch block  EKG dated 12/19/2017 reviewed Showing periods of irregular rhythm, as well as normal sinus rhythm rate 90 bpm Fusion beats, PVCs left bundle branch block unable to exclude Paroxysmal atrial fibrillation   PMH:   has a past medical history of Bronchitis, DJD (degenerative joint disease), cervical, Hyperlipidemia, Hypertension, Hypothyroid, LBBB (left bundle branch block), and SVT (supraventricular tachycardia) ([place]).  PSH:    Past Surgical History:  Procedure Laterality Date  . ELECTROPHYSIOLOGY STUDY N/A 02/27/2012   Procedure: ELECTROPHYSIOLOGY STUDY;  Surgeon: Evans Lance, MD;   Location: [facility] CATH LAB;  Service: Cardiovascular;  Laterality: N/A;  . EPS and ablation for SVT  5/13   slow pathway ablation by Dr Lovena Le  . EYE SURGERY     surgery for detatched retina  . SUPRAVENTRICULAR TACHYCARDIA ABLATION N/A 02/27/2012   Procedure: SUPRAVENTRICULAR TACHYCARDIA ABLATION;  Surgeon: Evans Lance, MD;  Location: [facility] CATH LAB;  Service: Cardiovascular;  Laterality: N/A;    Current Outpatient Medications  Medication Sig Dispense Refill  . acetaminophen (TYLENOL) 325 MG tablet Take 650 mg by mouth every 6 (six) hours as needed. For pain    . amLODipine (NORVASC) 2.5 MG tablet TAKE 1 TABLET(2.5 MG) BY MOUTH DAILY 90 tablet 0  . atorvastatin (LIPITOR) 20 MG tablet TAKE 1 TABLET(20 MG) BY MOUTH DAILY AT 6 PM 90 tablet 0  . busPIRone (BUSPAR) 5 MG tablet Take 1 tablet (5 mg total) by mouth 2 (two) times daily. 60 tablet 0  . cholecalciferol (VITAMIN D) 1000 UNITS tablet Take 2,000 Units by mouth daily.    Marland Kitchen levothyroxine (SYNTHROID, LEVOTHROID) 137 MCG tablet TAKE 1 TABLET(137 MCG) BY MOUTH EVERY MORNING 90 tablet 0  . metoprolol succinate (TOPROL-XL) 50 MG 24 hr tablet TAKE 1 TABLET BY MOUTH DAILY WITH OR IMMEDIATELY FOLLOWING A MEAL 90 tablet 0  . Multiple Vitamin (MULITIVITAMIN WITH MINERALS) TABS Take 1 tablet by mouth daily.    . potassium chloride (MICRO-K) 10 MEQ CR capsule TAKE 1 CAPSULE(10 MEQ) BY MOUTH DAILY 90 capsule 0  . valsartan-hydrochlorothiazide (DIOVAN HCT) 160-12.5 MG tablet Take 1 tablet by mouth daily. In place of  losartan because of recall 90 tablet 1  . XARELTO 20 MG TABS tablet TAKE 1 TABLET BY MOUTH DAILY WITH SUPPER 30 tablet 0  . ezetimibe (ZETIA) 10 MG tablet Take 1 tablet (10 mg total) by mouth daily. 30 tablet 11   No current facility-administered medications for this visit.      Allergies:   Codeine; Erythromycin; Penicillins; and Septra [sulfamethoxazole-trimethoprim]   Social History:  The patient  reports that she has never smoked. She has  never used smokeless tobacco. She reports that she does not drink alcohol or use drugs.   Family History:   family history includes Alzheimer's disease in her mother; Cancer in her father and paternal aunt; Stroke in her father; Thyroid disease in her maternal grandmother.    Review of Systems: Review of Systems  Constitutional: Negative.   Respiratory: Negative.   Cardiovascular: Positive for palpitations.       Tachycardia  Gastrointestinal: Negative.   Musculoskeletal: Negative.   Neurological: Negative.   Psychiatric/Behavioral: Negative.   All other systems reviewed and are negative.    PHYSICAL EXAM: VS:  BP (!) 164/78 (BP Location: Right Arm, Patient Position: Sitting, Cuff Size: Normal)   Pulse 69   Ht 5\' 6"  (1.676 m)   Wt 151 lb (68.5 kg)   BMI 24.37 kg/m  , BMI Body mass index is 24.37 kg/m. GEN: Well nourished, well developed, in no acute distress  HEENT: normal  Neck: no JVD, +2+ carotid bruit on the left, no masses Cardiac: RRR; no murmurs, rubs, or gallops,no edema  Respiratory:  clear to auscultation bilaterally, normal work of breathing GI: soft, nontender, nondistended, + BS Leslie: no deformity or atrophy  Skin: warm and dry, no rash Neuro:  Strength and sensation are intact Psych: euthymic mood, full affect   Recent Labs: 05/17/2017: Hemoglobin 12.9; Platelets 230 09/04/2017: TSH 3.97    Lipid Panel Lab Results  Component Value Date   CHOL 173 09/04/2017   HDL 60 09/04/2017   LDLCALC 87 09/04/2017   TRIG 159 (H) 09/04/2017      Wt Readings from Last 3 Encounters:  01/29/18 151 lb (68.5 kg)  01/23/18 150 lb (68 kg)  01/03/18 150 lb 6.4 oz (68.2 kg)       ASSESSMENT AND PLAN:  Atrial fibrillation, new onset ([place]) - Plan: LONG TERM MONITOR (3-14 DAYS) Previous EKG reviewed Sinus beats noted in V1, fusion beats,  PVCs, unable to exclude paroxysmal atrial fibrillation Long discussion with her  she was recently started on anticoagulation,  Xarelto She was previously noted to have sinus tachycardia when she was seen by Dr. Lovena Le after ablation 2013 We have ordered event monitor for further evaluation  PAD (peripheral artery disease) ([place]) - Plan: EKG 12-Lead Carotid stenosis less than 50% on the left per the notes Cholesterol not at goal, Recommended LDL less than 70 SVT (supraventricular tachycardia) ([place]) - Plan: EKG 12-Lead  Mixed hyperlipidemia We have suggested she is down her Lipitor 20, add Zetia 10 mg daily  Essential hypertension   Blood pressure elevated on today's visit Previous office visits January February was well controlled 109 systolic at home Recommended she monitor blood pressure at home, call us with numbers  Paroxysmal sinus tachycardia ([place]) - Plan: EKG 12-Lead Previous EP notes 2013 documenting sinus tachycardia Sinus beats noted in V1 on EKG in February Monitor ordered  Disposition:   F/U  1 month   Total encounter time more than 60 minutes  Greater than 50% was  spent in counseling and coordination of care with the patient    Orders Placed This Encounter  Procedures  . LONG TERM MONITOR (3-14 DAYS)  . EKG 12-Lead     Signed, Esmond Plants, M.D., Ph.D. 01/29/2018  [place], [place]

## 2018-01-29 ENCOUNTER — Encounter: Payer: Self-pay | Admitting: Cardiovascular Disease

## 2018-01-29 ENCOUNTER — Ambulatory Visit: Payer: Medicare Other | Admitting: Cardiovascular Disease

## 2018-01-29 ENCOUNTER — Ambulatory Visit (INDEPENDENT_AMBULATORY_CARE_PROVIDER_SITE_OTHER): Payer: Medicare Other

## 2018-01-29 VITALS — BP 164/78 | HR 69 | Ht 66.0 in | Wt 151.0 lb

## 2018-01-29 DIAGNOSIS — I471 Supraventricular tachycardia: Secondary | ICD-10-CM | POA: Diagnosis not present

## 2018-01-29 DIAGNOSIS — I739 Peripheral vascular disease, unspecified: Secondary | ICD-10-CM

## 2018-01-29 DIAGNOSIS — I4891 Unspecified atrial fibrillation: Secondary | ICD-10-CM

## 2018-01-29 DIAGNOSIS — I10 Essential (primary) hypertension: Secondary | ICD-10-CM

## 2018-01-29 DIAGNOSIS — E782 Mixed hyperlipidemia: Secondary | ICD-10-CM

## 2018-01-29 MED ORDER — EZETIMIBE 10 MG PO TABS: 10.0000 mg | ORAL_TABLET | Freq: Every day | ORAL | 11 refills | Status: DC

## 2018-01-29 NOTE — Patient Instructions (Addendum)
Medication Instructions:   Please start zetia one a day for cholesterol  Labwork:  No new labs needed  Testing/Procedures:  Event monitor (Zio) for sinus tachycardia, possible atrial fibrillation   Follow-Up: It was a pleasure seeing you in the office today. Please call us if you have new issues that need to be addressed before your next appt.  [phone]  Your physician wants you to follow-up in: 4 - 5  weeks You will receive a reminder letter in the mail two months in advance. If you don't receive a letter, please call our office to schedule the follow-up appointment.  If you need a refill on your cardiac medications before your next appointment, please call your pharmacy.  For educational health videos Log in to : [email or web address] Or : [email or web address], password : [email or web address]

## 2018-02-04 ENCOUNTER — Other Ambulatory Visit: Payer: Self-pay

## 2018-02-04 DIAGNOSIS — I10 Essential (primary) hypertension: Secondary | ICD-10-CM

## 2018-02-04 NOTE — Telephone Encounter (Signed)
Dr. Rockey Situ, I'm covering for Dr. Ancil Boozer Received request for amlodipine 2.5 mg, but I see other issues going on I see that her BP has not been controlled Would you prefer that I increase her CCB or should we increase her beta-blocker? Wanted your input since I've never seen her Thank you

## 2018-02-04 NOTE — Telephone Encounter (Signed)
Refill request for Hypertension medication:  Amlodipine 2.5 mg  Last office visit pertaining to hypertension: 01/29/2018  BP Readings from Last 3 Encounters:  01/29/18 (!) 164/78  01/23/18 (!) 172/74  01/03/18 (!) 169/66     Lab Results  Component Value Date   CREATININE 0.79 12/08/2016   BUN 14 12/08/2016   NA 139 12/08/2016   K 4.6 12/08/2016   CL 100 12/08/2016   CO2 24 12/08/2016    Follow-ups on file. 03/04/2018

## 2018-02-07 ENCOUNTER — Other Ambulatory Visit: Payer: Self-pay

## 2018-02-07 DIAGNOSIS — I10 Essential (primary) hypertension: Secondary | ICD-10-CM

## 2018-02-07 NOTE — Telephone Encounter (Signed)
Hypertension medication request: Amlodipine to [facility].   Last office visit pertaining to hypertension:  12/19/2017  BP Readings from Last 3 Encounters:  01/29/18 (!) 164/78  01/23/18 (!) 172/74  01/03/18 (!) 169/66    Lab Results  Component Value Date   CREATININE 0.79 12/08/2016   BUN 14 12/08/2016   NA 139 12/08/2016   K 4.6 12/08/2016   CL 100 12/08/2016   CO2 24 12/08/2016     Follow up on 02/15/2018

## 2018-02-08 MED ORDER — AMLODIPINE BESYLATE 2.5 MG PO TABS: 2.5000 mg | ORAL_TABLET | Freq: Every day | ORAL | 0 refills | Status: DC

## 2018-02-12 DIAGNOSIS — I4891 Unspecified atrial fibrillation: Secondary | ICD-10-CM

## 2018-02-14 ENCOUNTER — Other Ambulatory Visit: Payer: Self-pay | Admitting: Family Medicine

## 2018-02-14 DIAGNOSIS — I4891 Unspecified atrial fibrillation: Secondary | ICD-10-CM

## 2018-02-15 ENCOUNTER — Ambulatory Visit (INDEPENDENT_AMBULATORY_CARE_PROVIDER_SITE_OTHER): Payer: Medicare Other | Admitting: Family Medicine

## 2018-02-15 ENCOUNTER — Encounter: Payer: Self-pay | Admitting: Family Medicine

## 2018-02-15 VITALS — BP 152/76 | HR 63 | Temp 98.1°F | Resp 16 | Ht 66.0 in | Wt 155.7 lb

## 2018-02-15 DIAGNOSIS — M5441 Lumbago with sciatica, right side: Secondary | ICD-10-CM

## 2018-02-15 DIAGNOSIS — E782 Mixed hyperlipidemia: Secondary | ICD-10-CM | POA: Diagnosis not present

## 2018-02-15 DIAGNOSIS — I4891 Unspecified atrial fibrillation: Secondary | ICD-10-CM | POA: Diagnosis not present

## 2018-02-15 DIAGNOSIS — I10 Essential (primary) hypertension: Secondary | ICD-10-CM | POA: Diagnosis not present

## 2018-02-15 DIAGNOSIS — G8929 Other chronic pain: Secondary | ICD-10-CM | POA: Diagnosis not present

## 2018-02-15 DIAGNOSIS — F411 Generalized anxiety disorder: Secondary | ICD-10-CM

## 2018-02-15 DIAGNOSIS — R002 Palpitations: Secondary | ICD-10-CM | POA: Diagnosis not present

## 2018-02-15 DIAGNOSIS — M5412 Radiculopathy, cervical region: Secondary | ICD-10-CM

## 2018-02-15 DIAGNOSIS — J209 Acute bronchitis, unspecified: Secondary | ICD-10-CM | POA: Diagnosis not present

## 2018-02-15 DIAGNOSIS — I471 Supraventricular tachycardia: Secondary | ICD-10-CM | POA: Diagnosis not present

## 2018-02-15 DIAGNOSIS — M5416 Radiculopathy, lumbar region: Secondary | ICD-10-CM

## 2018-02-15 MED ORDER — FLUTICASONE FUROATE-VILANTEROL 100-25 MCG/INH IN AEPB: 1.0000 | INHALATION_SPRAY | Freq: Every day | RESPIRATORY_TRACT | 0 refills | Status: DC

## 2018-02-15 MED ORDER — BUSPIRONE HCL 5 MG PO TABS: 5.0000 mg | ORAL_TABLET | Freq: Two times a day (BID) | ORAL | 1 refills | Status: DC

## 2018-02-15 MED ORDER — BENZONATATE 100 MG PO CAPS: 100.0000 mg | ORAL_CAPSULE | Freq: Three times a day (TID) | ORAL | 0 refills | Status: DC | PRN

## 2018-02-15 NOTE — Progress Notes (Signed)
Name: [name]   MRN: [id number]    DOB: [birth year]   Date:02/15/2018       Progress Note  Subjective  Chief Complaint  Chief Complaint  Patient presents with  . Follow-up    low back pain and left leg pain. Was seen at [facility] and had testing, still in pain worse at night  . URI    cough, congested  . Neck Pain    HPI  Carotid calcification: found on x-ray of neck. She also had a vascular study done back in 2011 that showed mild plaque formation on right. . She is on statin therapy and aspirin, last LDL was 87, recently seen by Dr. Rockey Situ and he added Zetia, she is also on aspirin.  She denies headache or dizziness, she states she has balance problems - but states secondary to visual disturbance ( she can see better from left eye than right). She went back to Dr. Erven Colla and had ABI that was normal, carotid doppler unchanged and will go back in 2 years   Cervical radiculitis: seeing neurosurgeon at [facility] finishing medrol dose pain, still has neck pain and some pain that radiates down to right shoulder, described as aching, also occasional tingling on fingers. Also getting evaluated for lower back pain because on right leg tingling, also had temporarily improved with medrol dose pack given back in Feb, however back is getting worse. Affecting ability to walk and standing for a long time because it causes numbness on right lateral thigh. She has also stopped dancing because of pain. She never got the appointment for MRI c-spine and lumbar spine recommended by neurosurgeon and would like to see another neurosurgeon ( in network)   HTN: bp at home usually controlled but up during office visits. Possible white coat component, advised Buspar right before office visit. She also states she has not taken her bp medication this am. No chest pain, but has occasional palpitation  Possible Afib versus SVT: seen by Dr. Rockey Situ, he does not think afib, she had a heart holter and result is  pending, he will decide if she needs to stay on Xarelto. So far, no side effects of medication  GAD: doing well on Buspar twice daily  URI: she states mild rhinorrhea, no fever or chills, but has also been coughing, usually dry but sometimes productive ( pale yellow), symptoms started a few days ago. Feels like tickle on the back of her throat, also some wheezing at night. She never smoked in the past.     Patient Active Problem List   Diagnosis Date Noted  . Paroxysmal sinus tachycardia ([place]) 01/29/2018  . Leg pain 01/04/2018  . PAD (peripheral artery disease) ([place]) 01/04/2018  . Carotid artery calcification, bilateral 12/19/2017  . Cervical radiculitis 12/19/2017  . Atrial fibrillation, new onset ([place]) 12/19/2017  . Hyperglycemia 03/06/2016  . Hypothyroid 04/21/2015  . 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 Anxiety 04/21/2015  . Hyperlipidemia 04/21/2015  . Diuretic-induced hypokalemia 04/21/2015  . Palpitations 12/18/2012  . SVT (supraventricular tachycardia) ([place]) 05/27/2012  . Hypertension 05/27/2012    Past Surgical History:  Procedure Laterality Date  . ELECTROPHYSIOLOGY STUDY N/A 02/27/2012   Procedure: ELECTROPHYSIOLOGY STUDY;  Surgeon: Evans Lance, MD;  Location: [facility] CATH LAB;  Service: Cardiovascular;  Laterality: N/A;  . EPS and ablation for SVT  5/13   slow pathway ablation by Dr Lovena Le  . EYE SURGERY     surgery for detatched retina  .  SUPRAVENTRICULAR TACHYCARDIA ABLATION N/A 02/27/2012   Procedure: SUPRAVENTRICULAR TACHYCARDIA ABLATION;  Surgeon: Evans Lance, MD;  Location: [facility] CATH LAB;  Service: Cardiovascular;  Laterality: N/A;    Family History  Problem Relation Age of Onset  . Alzheimer's disease Mother   . Cancer Father   . Stroke Father   . Cancer Paternal Aunt   . Thyroid disease Maternal Grandmother     Social History   Socioeconomic History  . Marital status: Married    Spouse name: Not on file  . Number of  children: Not on file  . Years of education: Not on file  . Highest education level: Not on file  Occupational History  . Not on file  Social Needs  . Financial resource strain: Not on file  . Food insecurity:    Worry: Not on file    Inability: Not on file  . Transportation needs:    Medical: Not on file    Non-medical: Not on file  Tobacco Use  . Smoking status: Never Smoker  . Smokeless tobacco: Never Used  Substance and Sexual Activity  . Alcohol use: No  . Drug use: No  . Sexual activity: Not on file  Lifestyle  . Physical activity:    Days per week: Not on file    Minutes per session: Not on file  . Stress: Not on file  Relationships  . Social connections:    Talks on phone: Not on file    Gets together: Not on file    Attends religious service: Not on file    Active member of club or organization: Not on file    Attends meetings of clubs or organizations: Not on file    Relationship status: Not on file  . Intimate partner violence:    Fear of current or ex partner: Not on file    Emotionally abused: Not on file    Physically abused: Not on file    Forced sexual activity: Not on file  Other Topics Concern  . Not on file  Social History Narrative   Lives in [place]     Current Outpatient Medications:  .  acetaminophen (TYLENOL) 325 MG tablet, Take 650 mg by mouth every 6 (six) hours as needed. For pain, Disp: , Rfl:  .  amLODipine (NORVASC) 2.5 MG tablet, Take 1 tablet (2.5 mg total) by mouth daily., Disp: 30 tablet, Rfl: 0 .  atorvastatin (LIPITOR) 20 MG tablet, TAKE 1 TABLET(20 MG) BY MOUTH DAILY AT 6 PM, Disp: 90 tablet, Rfl: 0 .  busPIRone (BUSPAR) 5 MG tablet, Take 1 tablet (5 mg total) by mouth 2 (two) times daily., Disp: 180 tablet, Rfl: 1 .  cholecalciferol (VITAMIN D) 1000 UNITS tablet, Take 2,000 Units by mouth daily., Disp: , Rfl:  .  ezetimibe (ZETIA) 10 MG tablet, Take 1 tablet (10 mg total) by mouth daily., Disp: 30 tablet, Rfl: 11 .   levothyroxine (SYNTHROID, LEVOTHROID) 137 MCG tablet, TAKE 1 TABLET(137 MCG) BY MOUTH EVERY MORNING, Disp: 90 tablet, Rfl: 0 .  metoprolol succinate (TOPROL-XL) 50 MG 24 hr tablet, TAKE 1 TABLET BY MOUTH DAILY WITH OR IMMEDIATELY FOLLOWING A MEAL, Disp: 90 tablet, Rfl: 0 .  Multiple Vitamin (MULITIVITAMIN WITH MINERALS) TABS, Take 1 tablet by mouth daily., Disp: , Rfl:  .  potassium chloride (MICRO-K) 10 MEQ CR capsule, TAKE 1 CAPSULE(10 MEQ) BY MOUTH DAILY, Disp: 90 capsule, Rfl: 0 .  valsartan-hydrochlorothiazide (DIOVAN HCT) 160-12.5 MG tablet, Take 1 tablet by mouth  daily. In place of losartan because of recall, Disp: 90 tablet, Rfl: 1 .  XARELTO 20 MG TABS tablet, TAKE 1 TABLET BY MOUTH DAILY WITH SUPPER, Disp: 30 tablet, Rfl: 0 .  benzonatate (TESSALON) 100 MG capsule, Take 1-2 capsules (100-200 mg total) by mouth 3 (three) times daily as needed., Disp: 40 capsule, Rfl: 0 .  fluticasone furoate-vilanterol (BREO ELLIPTA) 100-25 MCG/INH AEPB, Inhale 1 puff into the lungs daily., Disp: 60 each, Rfl: 0  Allergies  Allergen Reactions  . Codeine Nausea And Vomiting  . Erythromycin Swelling  . Penicillins Swelling  . Septra [Sulfamethoxazole-Trimethoprim] Swelling     ROS  Constitutional: Negative for fever or weight change.  Respiratory: Positive  for cough but no  shortness of breath.   Cardiovascular: Negative for chest pain , she has intermittent palpitations.  Gastrointestinal: Negative for abdominal pain, no bowel changes.  Musculoskeletal: Positive for gait problem ( needs to stop when walking because pain goes from right lower back to right leg) or joint swelling.  Skin: Negative for rash.  Neurological: Negative for dizziness or headache.  No other specific complaints in a complete review of systems (except as listed in HPI above).  Objective  Vitals:   02/15/18 0759 02/15/18 0900  BP: (!) 160/82 (!) 152/76  Pulse: 63   Resp: 16   Temp: 98.1 F (36.7 C)   TempSrc: Oral    SpO2: 94%   Weight: 155 lb 11.2 oz (70.6 kg)   Height: 5\' 6"  (1.676 m)     Body mass index is 25.13 kg/m.  Physical Exam  Constitutional: Patient appears well-developed and well-nourished.No distress.  HEENT: head atraumatic, normocephalic, pupils equal and reactive to light, normal TM bilaterally, neck supple, throat within normal limits. Pain with rom of neck, pain radiates from right side of neck to shoulder, no weakness on arms, pain worse with right rotation  Cardiovascular: Normal rate, irregular rhythm and normal heart sounds.  No murmur heard. No BLE edema. Pulmonary/Chest: Effort normal and breath sounds normal. No respiratory distress. Abdominal: Soft.  There is no tenderness. Psychiatric: Patient has a normal mood and affect. behavior is normal. Judgment and thought content normal. Muscular Skeletal: lumbar spine tender to palpation of right lower back, negative straight leg raise.  PHQ2/9: Depression screen [facility] 2/9 12/04/2017 09/03/2017 05/17/2017 03/12/2017 06/06/2016  Decreased Interest 0 0 0 0 0  Down, Depressed, Hopeless 0 0 0 1 0  PHQ - 2 Score 0 0 0 1 0     Fall Risk: Fall Risk  12/19/2017 12/04/2017 09/03/2017 05/17/2017 03/12/2017  Falls in the past year? No No No No No  Number falls in past yr: - - - - -  Injury with Fall? - - - - -  [place] for fall due to : - - - - -  Risk for fall due to: Comment - - - - -  Follow up - - - - -     Functional Status Survey:   Normal   Assessment & Plan   1. Cervical radiculitis  - MR Cervical Spine Wo Contrast; Future - Ambulatory referral to Neurosurgery  2. Chronic radicular low back pain  - Ambulatory referral to Neurosurgery  3. Chronic right-sided low back pain with right-sided sciatica  - MR Lumbar Spine Wo Contrast; Future  4. Acute bronchitis, unspecified organism  - benzonatate (TESSALON) 100 MG capsule; Take 1-2 capsules (100-200 mg total) by mouth 3 (three) times daily as  needed.   Dispense: 40 capsule; Refill: 0 - fluticasone furoate-vilanterol (BREO ELLIPTA) 100-25 MCG/INH AEPB; Inhale 1 puff into the lungs daily.  Dispense: 60 each; Refill: 0  5. Essential hypertension  Recheck bp improved before she left, take bp medication prior to office visit  6. SVT (supraventricular tachycardia) (HCC)  Keep follow up with Dr. Rockey Situ   7. Atrial fibrillation, new onset ([place])  Keep follow up with Dr. Rockey Situ   8. Mixed hyperlipidemia  On statin and Zetia   9. GAD (generalized anxiety disorder)  - busPIRone (BUSPAR) 5 MG tablet; Take 1 tablet (5 mg total) by mouth 2 (two) times daily.  Dispense: 180 tablet; Refill: 1

## 2018-02-21 MED ORDER — AMLODIPINE BESYLATE 2.5 MG PO TABS: ORAL_TABLET | ORAL | 0 refills | Status: DC

## 2018-03-03 NOTE — Progress Notes (Signed)
Cardiology Office Note  Date:  03/04/2018   ID:  [name], DOB June 23, 1941, MRN [id number]  PCP:  Steele Sizer, MD   Chief Complaint  Patient presents with  . othe r    1 month follow up as well as discuss the [place]. Meds reviewed by the pt. verbally. Pt. c/o ankles swelling, rapid heart beats, dry cough, shortness of breath and chest tightness; symptoms lasted all last week.     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 Lovena Le to have sinus tachycardia, lost to cardiology follow-up Who presents by referral from Dr. Ancil Boozer for possible atrial fibrillation, abnormal EKG, palpitations  Event monitor She is having paroxysmal atrial fibrillation Would stay on her anticoagulation, Xarelto Also having other runs of tachycardia but very short  In follow-up today she reports having atrial fibrillation, rate greater than 100 Had difficulty going to church yesterday rate was 140 Today in clinic rate 130, moderately symptomatic Someone else's driving  Reports having periodic bradycardia but is asymptomatic Periodic leg swelling, not bad this morning Consistent on her anticoagulation  EKG personally reviewed by myself on todays visit Shows atrial fibrillation intermittent left bundle branch block rate 138 bpm  Other past medical history reviewed She reports having mild carotid stenosis on the left Initially found on x-ray of neck.  vascular study done back in 2011 that showed mild plaque formation. Repeat ultrasound April 2019 with plaque noted,  less than 50%   EKG dated 12/19/2017  Showing periods of irregular rhythm, as well as normal sinus rhythm rate 90 bpm Fusion beats, PVCs left bundle branch block unable to exclude Paroxysmal atrial fibrillation   PMH:   has a past medical history of Bronchitis, DJD (degenerative joint disease), cervical, Hyperlipidemia, Hypertension,  Hypothyroid, LBBB (left bundle branch block), and SVT (supraventricular tachycardia) ([place]).  PSH:    Past Surgical History:  Procedure Laterality Date  . ELECTROPHYSIOLOGY STUDY N/A 02/27/2012   Procedure: ELECTROPHYSIOLOGY STUDY;  Surgeon: Evans Lance, MD;  Location: [facility] CATH LAB;  Service: Cardiovascular;  Laterality: N/A;  . EPS and ablation for SVT  5/13   slow pathway ablation by Dr Lovena Le  . EYE SURGERY     surgery for detatched retina  . SUPRAVENTRICULAR TACHYCARDIA ABLATION N/A 02/27/2012   Procedure: SUPRAVENTRICULAR TACHYCARDIA ABLATION;  Surgeon: Evans Lance, MD;  Location: [facility] CATH LAB;  Service: Cardiovascular;  Laterality: N/A;    Current Outpatient Medications  Medication Sig Dispense Refill  . acetaminophen (TYLENOL) 325 MG tablet Take 650 mg by mouth every 6 (six) hours as needed. For pain    . amLODipine (NORVASC) 2.5 MG tablet TAKE 1 TABLET(2.5 MG) BY MOUTH DAILY 90 tablet 0  . atorvastatin (LIPITOR) 20 MG tablet TAKE 1 TABLET(20 MG) BY MOUTH DAILY AT 6 PM 90 tablet 0  . benzonatate (TESSALON) 100 MG capsule Take 1-2 capsules (100-200 mg total) by mouth 3 (three) times daily as needed. 40 capsule 0  . busPIRone (BUSPAR) 5 MG tablet Take 1 tablet (5 mg total) by mouth 2 (two) times daily. 180 tablet 1  . cholecalciferol (VITAMIN D) 1000 UNITS tablet Take 2,000 Units by mouth daily.    Marland Kitchen ezetimibe (ZETIA) 10 MG tablet Take 1 tablet (10 mg total) by mouth daily. 30 tablet 11  . levothyroxine (SYNTHROID, LEVOTHROID) 137 MCG tablet TAKE 1 TABLET(137 MCG) BY MOUTH EVERY MORNING 90  tablet 0  . metoprolol succinate (TOPROL-XL) 50 MG 24 hr tablet TAKE 1 TABLET BY MOUTH DAILY WITH OR IMMEDIATELY FOLLOWING A MEAL 90 tablet 0  . Multiple Vitamin (MULITIVITAMIN WITH MINERALS) TABS Take 1 tablet by mouth daily.    . potassium chloride (MICRO-K) 10 MEQ CR capsule TAKE 1 CAPSULE(10 MEQ) BY MOUTH DAILY 90 capsule 0  . valsartan-hydrochlorothiazide (DIOVAN HCT) 160-12.5 MG tablet Take 1  tablet by mouth daily. In place of losartan because of recall 90 tablet 1  . XARELTO 20 MG TABS tablet TAKE 1 TABLET BY MOUTH DAILY WITH SUPPER 30 tablet 0   No current facility-administered medications for this visit.      Allergies:   Codeine; Erythromycin; Penicillins; and Septra [sulfamethoxazole-trimethoprim]   Social History:  The patient  reports that she has never smoked. She has never used smokeless tobacco. She reports that she does not drink alcohol or use drugs.   Family History:   family history includes Alzheimer's disease in her mother; Cancer in her father and paternal aunt; Stroke in her father; Thyroid disease in her maternal grandmother.    Review of Systems: Review of Systems  Constitutional: Negative.   Respiratory: Negative.   Cardiovascular:       Tachycardia  Gastrointestinal: Negative.   Musculoskeletal: Negative.   Neurological: Negative.   Psychiatric/Behavioral: Negative.   All other systems reviewed and are negative.    PHYSICAL EXAM: VS:  BP (!) 164/80 (BP Location: Left Arm, Patient Position: Sitting, Cuff Size: Normal)   Pulse (!) 138   Ht 5\' 6"  (1.676 m)   Wt 153 lb (69.4 kg)   SpO2 98%   BMI 24.69 kg/m  , BMI Body mass index is 24.69 kg/m. Constitutional:  oriented to person, place, and time. No distress.  HENT:  Head: Normocephalic and atraumatic.  Eyes:  no discharge. No scleral icterus.  Neck: Normal range of motion. Neck supple. No JVD present.  Cardiovascular: Irregularly irregular, normal heart sounds and intact distal pulses. Exam reveals no gallop and no friction rub. No edema No murmur heard. Pulmonary/Chest: Effort normal and breath sounds normal. No stridor. No respiratory distress.  no wheezes.  no rales.  no tenderness.  Abdominal: Soft.  no distension.  no tenderness.  Musculoskeletal: Normal range of motion.  no  tenderness or deformity.  Neurological:  normal muscle tone. Coordination normal. No atrophy Skin: Skin is  warm and dry. No rash noted. not diaphoretic.  Psychiatric:  normal mood and affect. behavior is normal. Thought content normal.    Recent Labs: 05/17/2017: Hemoglobin 12.9; Platelets 230 09/04/2017: TSH 3.97    Lipid Panel Lab Results  Component Value Date   CHOL 173 09/04/2017   HDL 60 09/04/2017   LDLCALC 87 09/04/2017   TRIG 159 (H) 09/04/2017      Wt Readings from Last 3 Encounters:  03/04/18 153 lb (69.4 kg)  02/15/18 155 lb 11.2 oz (70.6 kg)  01/29/18 151 lb (68.5 kg)       ASSESSMENT AND PLAN:  Atrial fibrillation, new onset ([place]) - Plan: LONG TERM MONITOR (3-14 DAYS) Tolerating her anticoagulation Having paroxysmal atrial fibrillation as noted on EKG today In an effort to restore normal sinus rhythm we will increase metoprolol succinate up to 50 twice a day Stop amlodipine and change to diltiazem extended release 120 mg daily  PAD (peripheral artery disease) ([place]) - Plan: EKG 12-Lead Carotid stenosis less than 50% on the left  Recommended LDL less than 70 Statin with  Zetia  SVT (supraventricular tachycardia) (HCC) - Plan: EKG 12-Lead Medication changes as above  Mixed hyperlipidemia Lipitor 20, Zetia 10 mg daily  Essential hypertension   Blood pressure elevated on today's visit Medication changes as above for atrial fibrillation and rate control  Paroxysmal sinus tachycardia ([place]) - Plan: EKG 12-Lead Previous EP notes 2013 documenting sinus tachycardia Atrial fibrillation noted on event monitor Monitor reviewed with her in detail  Disposition:   F/U  1 month   Total encounter time more than 25 minutes  Greater than 50% was spent in counseling and coordination of care with the patient    Orders Placed This Encounter  Procedures  . EKG 12-Lead     Signed, Esmond Plants, M.D., Ph.D. 03/04/2018  [place], [place]

## 2018-03-04 ENCOUNTER — Encounter: Payer: Self-pay | Admitting: Cardiovascular Disease

## 2018-03-04 ENCOUNTER — Ambulatory Visit: Payer: Medicare Other | Admitting: Cardiovascular Disease

## 2018-03-04 VITALS — BP 164/80 | HR 138 | Ht 66.0 in | Wt 153.0 lb

## 2018-03-04 DIAGNOSIS — I739 Peripheral vascular disease, unspecified: Secondary | ICD-10-CM

## 2018-03-04 DIAGNOSIS — I4891 Unspecified atrial fibrillation: Secondary | ICD-10-CM | POA: Diagnosis not present

## 2018-03-04 DIAGNOSIS — I471 Supraventricular tachycardia: Secondary | ICD-10-CM | POA: Diagnosis not present

## 2018-03-04 DIAGNOSIS — I10 Essential (primary) hypertension: Secondary | ICD-10-CM

## 2018-03-04 DIAGNOSIS — I6523 Occlusion and stenosis of bilateral carotid arteries: Secondary | ICD-10-CM | POA: Diagnosis not present

## 2018-03-04 DIAGNOSIS — E782 Mixed hyperlipidemia: Secondary | ICD-10-CM

## 2018-03-04 MED ORDER — DILTIAZEM HCL ER COATED BEADS 120 MG PO CP24: 120.0000 mg | ORAL_CAPSULE | Freq: Every day | ORAL | 3 refills | Status: DC

## 2018-03-04 MED ORDER — METOPROLOL SUCCINATE ER 50 MG PO TB24: ORAL_TABLET | ORAL | 3 refills | Status: DC

## 2018-03-04 NOTE — Patient Instructions (Addendum)
Monitor your heart rate/ blood pressure readings for the next few days- call and let us know how your readings are doing.   Medication Instructions:   Please increase the metoprolol succinate up to 50 mg twice a day Start diltiazem 120 mg- take one capsule a day Stop the amlodipine   Labwork:  No new labs needed  Testing/Procedures:  No further testing at this time   Follow-Up: It was a pleasure seeing you in the office today. Please call us if you have new issues that need to be addressed before your next appt.  [phone]  Your physician wants you to follow-up in: 1 months.  You will receive a reminder letter in the mail two months in advance. If you don't receive a letter, please call our office to schedule the follow-up appointment.  If you need a refill on your cardiac medications before your next appointment, please call your pharmacy.  For educational health videos Log in to : [email or web address] Or : [email or web address], password : [email or web address]

## 2018-03-05 ENCOUNTER — Telehealth: Payer: Self-pay | Admitting: Cardiovascular Disease

## 2018-03-05 DIAGNOSIS — M47892 Other spondylosis, cervical region: Secondary | ICD-10-CM | POA: Diagnosis not present

## 2018-03-05 DIAGNOSIS — M47812 Spondylosis without myelopathy or radiculopathy, cervical region: Secondary | ICD-10-CM | POA: Diagnosis not present

## 2018-03-05 NOTE — Telephone Encounter (Signed)
I reviewed the patient's HR readings with Dr. Rockey Situ. No changes recommended at this time. He asked that the patient come in later this week for an EKG to be done to confirm sinus rhythm. The patient is agreeable with coming in this Thursday at 11:00 am for an EKG.

## 2018-03-05 NOTE — Telephone Encounter (Signed)
Pt is calling with HR today is 77, and 79

## 2018-03-07 ENCOUNTER — Ambulatory Visit (INDEPENDENT_AMBULATORY_CARE_PROVIDER_SITE_OTHER): Payer: Medicare Other | Admitting: *Deleted

## 2018-03-07 VITALS — BP 142/70 | HR 117 | Ht 66.0 in | Wt 153.8 lb

## 2018-03-07 DIAGNOSIS — I4891 Unspecified atrial fibrillation: Secondary | ICD-10-CM | POA: Diagnosis not present

## 2018-03-07 MED ORDER — DILTIAZEM HCL ER COATED BEADS 120 MG PO CP24: 120.0000 mg | ORAL_CAPSULE | Freq: Two times a day (BID) | ORAL | 3 refills | Status: DC

## 2018-03-07 NOTE — Patient Instructions (Signed)
Medication Instructions:  Your physician has recommended you make the following change in your medication:   1. INCREASE Diltiazem 120 mg twice a day   Follow-Up: Nurse visit for repeat EKG in one week.   Follow up with Dr. Rockey Situ first part of June.  Your physician has requested that you regularly monitor and record your blood pressure readings at home. Please use the same machine at the same time of day to check your readings and record them to bring to your follow-up visit.  Dr. Rockey Situ would like you to monitor your blood pressures and heart rates. He would like you to keep a log of those readings to bring into your appointments.

## 2018-03-07 NOTE — Progress Notes (Signed)
1.) Reason for visit: EKG  2.) Name of MD requesting visit: Gollan  3.) H&P: Patient with new onset atrial fibrillation that was seen in the office on 03/04/18 with Dr. Rockey Situ. The patient presented to the office in a-fib w/ RVR on 5/13 with at HR of 138 bpm. She have complaints of shortness of breath at that time. Dr. Rockey Situ increased her metoprolol succinate to 50 mg BID & started diltiazem 120 mg once daily at that visit. She called the office the next day reporting her HR's were in the mid to upper 70's and that she was feeling much better. Dr. Rockey Situ advised she come in later this week for an EKG to follow up on her rhythm.  4.) ROS related to problem: Per the patient, HR readings at home have been in the 70's up to around 80 bpm last night. She states she is feeling much better and was able to walk from the parking lot to the office today with getting SOB. EKG obtained today shows atrial fib with a HR of 117 bpm. The patient states her HR was 79 just prior to leaving her house to come here.  5.) Assessment and plan per MD: EKG reviewed with Dr. Rockey Situ. Orders received to have the patient increase diltiazem to 120 mg BID and come in for a follow up nurse visit in 1 week. The patient was agreeable.

## 2018-03-08 ENCOUNTER — Other Ambulatory Visit: Payer: Self-pay | Admitting: Neurosurgery

## 2018-03-08 DIAGNOSIS — M47892 Other spondylosis, cervical region: Secondary | ICD-10-CM

## 2018-03-08 DIAGNOSIS — M5416 Radiculopathy, lumbar region: Secondary | ICD-10-CM

## 2018-03-13 ENCOUNTER — Ambulatory Visit (INDEPENDENT_AMBULATORY_CARE_PROVIDER_SITE_OTHER): Payer: Medicare Other | Admitting: *Deleted

## 2018-03-13 VITALS — BP 142/82 | HR 83 | Ht 66.0 in | Wt 156.8 lb

## 2018-03-13 DIAGNOSIS — I4891 Unspecified atrial fibrillation: Secondary | ICD-10-CM

## 2018-03-13 NOTE — Progress Notes (Signed)
1.) Reason for visit: EKG check  2.) Name of MD requesting visit: Gollan*  3.) H&P: atrial fib  4.) ROS related to problem: EKG check. Diltiazem 120 mg was increased to BID on 03/07/18 after EKG nurse visit. Patient denies shortness of breath, chest pain or dizziness. She is experiencing palpitations at times and lower extremity swelling that worsens throughout the day and gets better at night.  5.) Assessment and plan per MD: Advised patient I will show EKG to Dr Rockey Situ and let her know if he changes the plan of care. Otherwise, continue current medications and keep f/u appointment as scheduled. She verbalized understanding.

## 2018-03-14 ENCOUNTER — Other Ambulatory Visit: Payer: Self-pay | Admitting: Family Medicine

## 2018-03-14 DIAGNOSIS — J209 Acute bronchitis, unspecified: Secondary | ICD-10-CM

## 2018-03-16 ENCOUNTER — Ambulatory Visit
Admission: RE | Admit: 2018-03-16 | Discharge: 2018-03-16 | Disposition: A | Discharge status: Home / Self Care | Payer: Medicare Other | Source: Ambulatory Visit | Attending: Neurosurgery | Admitting: Neurosurgery

## 2018-03-16 DIAGNOSIS — M4802 Spinal stenosis, cervical region: Secondary | ICD-10-CM | POA: Diagnosis not present

## 2018-03-16 DIAGNOSIS — M4722 Other spondylosis with radiculopathy, cervical region: Secondary | ICD-10-CM | POA: Diagnosis not present

## 2018-03-16 DIAGNOSIS — M48061 Spinal stenosis, lumbar region without neurogenic claudication: Secondary | ICD-10-CM | POA: Diagnosis not present

## 2018-03-16 DIAGNOSIS — M5416 Radiculopathy, lumbar region: Secondary | ICD-10-CM

## 2018-03-16 DIAGNOSIS — M5126 Other intervertebral disc displacement, lumbar region: Secondary | ICD-10-CM | POA: Diagnosis not present

## 2018-03-16 DIAGNOSIS — M47892 Other spondylosis, cervical region: Secondary | ICD-10-CM

## 2018-03-16 DIAGNOSIS — M545 Low back pain: Secondary | ICD-10-CM | POA: Diagnosis not present

## 2018-03-20 ENCOUNTER — Telehealth: Payer: Self-pay | Admitting: Cardiovascular Disease

## 2018-03-20 DIAGNOSIS — R0602 Shortness of breath: Secondary | ICD-10-CM

## 2018-03-20 DIAGNOSIS — I4891 Unspecified atrial fibrillation: Secondary | ICD-10-CM

## 2018-03-20 NOTE — Telephone Encounter (Signed)
Pt c/o swelling: STAT is pt has developed SOB within 24 hours  How much weight have you gained and in what time span? A few pounds  1) If swelling, where is the swelling located? Both ankles and up into legs, up more into the left  2) Are you currently taking a fluid pill? yes  3) Are you currently SOB? Yes, states she had to sit up to sleep  4) Do you have a log of your daily weights (if so, list)? 3 pounds over 3-4 days  5) Have you gained 3 pounds in a day or 5 pounds in a week? yes  6) Have you traveled recently? no

## 2018-03-20 NOTE — Telephone Encounter (Signed)
Please have [name] come in for a basic metabolic panel, CBC, BNP, and blood pressure/heart rate check today or tomorrow.  Assuming her renal function and electrolytes are normal, I recommend starting furosemide 40 mg daily and set her up for an echo as soon as possible.  She should be seen by an APP or Dr. Rockey Situ later this week or early next week.  If her symptoms worsen in the meantime, she should seek immediate medical attention in the emergency department.  Leslie Bush, MD [facility] Pager: [phone]

## 2018-03-20 NOTE — Progress Notes (Signed)
Minna Merritts, MD  Vanessa Ralphs, RN        Rate was well controlled  No med changes  TG

## 2018-03-20 NOTE — Telephone Encounter (Signed)
Spoke with patient and she reports that her ankles, feet, and legs have been swelling over the last couple of weeks. Now she reports trouble breathing, dry cough, and now not able to sleep. She also reports gaining 7 pounds in the last week 160.5 and was 153 at last office visit. Instructed her to wear compression socks/hose, elevate feet when possible, and avoid salt and fluid intake. Let her know that I would have provider review and if any recommendations I would give her a call back. She verbalized understanding with no further questions at this time.

## 2018-03-20 NOTE — Telephone Encounter (Signed)
Spoke with patient and reviewed Dr. Darnelle Bos recommendations. Patient will come in tomorrow AM to have labs done over at the [facility] and then come to our office for blood pressure and heart rate check. Also scheduled her to come in on Friday to see Christell Faith PA at 11:00 AM. Patient also needs echocardiogram as well and will make sure this is scheduled as well. She verbalized understanding of all instructions with no further questions at this time.

## 2018-03-21 ENCOUNTER — Ambulatory Visit (INDEPENDENT_AMBULATORY_CARE_PROVIDER_SITE_OTHER): Payer: Medicare Other

## 2018-03-21 ENCOUNTER — Other Ambulatory Visit
Admission: RE | Admit: 2018-03-21 | Discharge: 2018-03-21 | Disposition: A | Discharge status: Home / Self Care | Payer: Medicare Other | Source: Ambulatory Visit | Attending: Cardiovascular Disease | Admitting: Cardiovascular Disease

## 2018-03-21 ENCOUNTER — Other Ambulatory Visit: Payer: Self-pay

## 2018-03-21 ENCOUNTER — Ambulatory Visit (INDEPENDENT_AMBULATORY_CARE_PROVIDER_SITE_OTHER): Payer: Medicare Other | Admitting: *Deleted

## 2018-03-21 VITALS — BP 155/89 | HR 87 | Resp 19 | Ht 66.0 in | Wt 157.5 lb

## 2018-03-21 DIAGNOSIS — M5126 Other intervertebral disc displacement, lumbar region: Secondary | ICD-10-CM | POA: Diagnosis not present

## 2018-03-21 DIAGNOSIS — R0602 Shortness of breath: Secondary | ICD-10-CM | POA: Insufficient documentation

## 2018-03-21 DIAGNOSIS — I4891 Unspecified atrial fibrillation: Secondary | ICD-10-CM | POA: Diagnosis not present

## 2018-03-21 DIAGNOSIS — M542 Cervicalgia: Secondary | ICD-10-CM | POA: Diagnosis not present

## 2018-03-21 DIAGNOSIS — M47892 Other spondylosis, cervical region: Secondary | ICD-10-CM | POA: Diagnosis not present

## 2018-03-21 DIAGNOSIS — M5416 Radiculopathy, lumbar region: Secondary | ICD-10-CM | POA: Diagnosis not present

## 2018-03-21 DIAGNOSIS — I10 Essential (primary) hypertension: Secondary | ICD-10-CM | POA: Diagnosis not present

## 2018-03-21 LAB — CBC WITH DIFFERENTIAL/PLATELET
BASOS ABS: 0 10*3/uL (ref 0–0.1)
BASOS PCT: 0 %
EOS PCT: 1 %
Eosinophils Absolute: 0.1 10*3/uL (ref 0–0.7)
HCT: 35.4 % (ref 35.0–47.0)
Hemoglobin: 12.3 g/dL (ref 12.0–16.0)
Lymphocytes Relative: 17 %
Lymphs Abs: 1.1 10*3/uL (ref 1.0–3.6)
MCH: 32.2 pg (ref 26.0–34.0)
MCHC: 34.6 g/dL (ref 32.0–36.0)
MCV: 93 fL (ref 80.0–100.0)
MONO ABS: 0.5 10*3/uL (ref 0.2–0.9)
Monocytes Relative: 8 %
Neutro Abs: 4.7 10*3/uL (ref 1.4–6.5)
Neutrophils Relative %: 74 %
PLATELETS: 299 10*3/uL (ref 150–440)
RBC: 3.81 MIL/uL (ref 3.80–5.20)
RDW: 14.1 % (ref 11.5–14.5)
WBC: 6.5 10*3/uL (ref 3.6–11.0)

## 2018-03-21 LAB — BASIC METABOLIC PANEL
Anion gap: 9 (ref 5–15)
BUN: 15 mg/dL (ref 6–20)
CO2: 26 mmol/L (ref 22–32)
CREATININE: 0.78 mg/dL (ref 0.44–1.00)
Calcium: 9.1 mg/dL (ref 8.9–10.3)
Chloride: 104 mmol/L (ref 101–111)
GFR calc Af Amer: >60 mL/min (ref >60)
GLUCOSE: 145 mg/dL — AB (ref 65–99)
Potassium: 3.5 mmol/L (ref 3.5–5.1)
Sodium: 139 mmol/L (ref 135–145)

## 2018-03-21 LAB — BRAIN NATRIURETIC PEPTIDE: B NATRIURETIC PEPTIDE 5: 336 pg/mL — AB (ref 0.0–100.0)

## 2018-03-21 MED ORDER — PERFLUTREN LIPID MICROSPHERE
1.0000 mL | INTRAVENOUS | Status: AC | PRN
  Administered 2018-03-21: 2 mL via INTRAVENOUS

## 2018-03-21 NOTE — Progress Notes (Signed)
  1.) Reason for visit: BP/Heart rate check  2.) Name of MD requesting visit: Dr. Saunders Revel  3.) H&P: Afib, ST, SVT, Hypertension  4.) ROS related to problem: Patient was experiencing increased swelling to legs, feet, and ankles with 7 pound weight increase based on her scales. She did keep legs elevated yesterday for most of the day.  She reports chest "feels tight like fluid there" and is going to Neurosurgery after this visit. She reports some chest pain but not all the time and reports it feels like indigestion. Also has some shortness of breath but not all the time. Denies any pain at this time and reports her swelling is better.  EKG showed afib and will review with provider.  5.) Assessment and plan per MD: Patient had labs and echo done this morning and has appointment to see Christell Faith PA tomorrow. She reports swelling has gotten better and denies pain at this time. She confirmed appointment for tomorrow with no further questions or concerns at this time.

## 2018-03-21 NOTE — Progress Notes (Signed)
Cardiology Office Note Date:  03/22/2018  Patient ID:  [name], [name] October 29, 1940, MRN [id number] PCP:  Steele Sizer, MD  Cardiologist:  Dr. Rockey Situ, MD    Chief Complaint: Follow-up A. fib  History of Present Illness: [name] is a 77 y.o. female with history of AVNRT status post catheter ablation in 2013, SVT, recently diagnosed PAF on Xarelto, LBBB, mild carotid artery stenosis with carotid ultrasound on 01/2018 showing no significant stenosis, hypertension, hyperlipidemia, leg pain with normal ABIs in 01/2018, and DJD who presents for follow-up of A. fib  Patient with a long history of tachypalpitations that usually resolve with sitting after several minutes.  Patient was seen PCP in 11/2017 with twelve-lead EKG at that time showing periods of irregular rhythm as well as NSR with PVCs, fusion beats, LBBB, heart rate 90 bpm, unable to exclude PAF.  She was started on Xarelto.  She was referred to cardiology and was seen by Dr. Rockey Situ on 01/29/2018 with EKG at that visit showing NSR, 69 bpm, LBBB.  At that time, most recent labs from 08/2017 showed a normal TSH.  No recent BMP.  CBC from 04/2017 was unremarkable.  She underwent outpatient cardiac monitoring that showed normal sinus rhythm with a minimum heart rate of 30 bpm, maximum heart rate 179 bpm, and an average heart rate of 66 bpm.  Bundle branch block was noted.  She was noted to have 45 runs of SVT with the fastest interval lasting 13 beats with a max rate of 179 bpm and the longest lasting 14.5 seconds with an average rate of 96 bpm.  It was felt that some episodes of SVT may be short bursts of A. fib/flutter.  She was noted to have an A. fib burden of less than 2% with heart rates ranging from 56 to 129 bpm with an average rate of 79 bpm.  The longest run of A. fib was 4 hours and 24 minutes with an average rate of 79 bpm.  Mobitz type I was noted.  Some of the patient detected events corresponded with A. fib.  She was most recently  seen in the office on 03/04/2018 with a BP of 164/80, heart rate 138 bpm.  She was noted to be in A. fib with RVR.  Her Toprol-XL was increased to 50 mg twice daily, amlodipine was stopped and she was started on Cardizem CD 120 mg daily.  RN visit on 5/16 showed continued A. fib with RVR with heart rate of 116 bpm.  At that time, her Cardizem CD 120 mg was increased to twice daily.  RN visit on 5/22 showed A. fib, 83 bpm.  No changes were made.  Patient called our office on 5/29 noting increased lower extremity swelling and orthopnea with associated weight gain of 7 pounds over the prior week.  Labs drawn on 5/30 showed an unremarkable CBC, BNP 336, potassium 3.5, serum creatinine 0.78, glucose 145, sodium 139.  TTE on 03/21/2018 showed EF 45-50%, diffuse HK, trivial AI, mildly calcified mitral annulus with moderate mitral regurgitation directed posteriorly, mildly dilated left atrium, RVSF normal, PASP 40-45 mmHg, elevated CVP, trivial pericardial effusion. EKG at that visit showed Afib, 78 bpm, LBBB.  Patient comes in today continuing to note lower extremity swelling. She dates this back to the titration of her diltiazem to bid dosing. She also notes palpitations, increased SOB, and intermittent dizziness. No chest pain, presyncope, or syncope. No falls. No BRBPR or melena. She has been compliant with  her Xarelto since she was started on it in 11/2017, not missing any doses. Heart rate at home has ranged from the 70s to 145 bpm. She is fairly symptomatic with her Afib. Weight is up 5 pounds today when compared to her 01/2018 office visit, though down 4 pounds from her peak weight of 160 pounds the week prior.     Past Medical History:  Diagnosis Date  . Bronchitis   . DJD (degenerative joint disease), cervical   . Hyperlipidemia   . Hypertension   . Hypothyroid   . LBBB (left bundle branch block)   . SVT (supraventricular tachycardia) (HCC)    a. s/p ablation by Dr Lovena Le 5/13    Past Surgical  History:  Procedure Laterality Date  . ELECTROPHYSIOLOGY STUDY N/A 02/27/2012   Procedure: ELECTROPHYSIOLOGY STUDY;  Surgeon: Evans Lance, MD;  Location: [facility] CATH LAB;  Service: Cardiovascular;  Laterality: N/A;  . EPS and ablation for SVT  5/13   slow pathway ablation by Dr Lovena Le  . EYE SURGERY     surgery for detatched retina  . SUPRAVENTRICULAR TACHYCARDIA ABLATION N/A 02/27/2012   Procedure: SUPRAVENTRICULAR TACHYCARDIA ABLATION;  Surgeon: Evans Lance, MD;  Location: [facility] C[facility] LAB;  Service: Cardiovascular;  Laterality: N/A;    Current Meds  Medication Sig  . acetaminophen (TYLENOL) 325 MG tablet Take 650 mg by mouth every 6 (six) hours as needed. For pain  . atorvastatin (LIPITOR) 20 MG tablet TAKE 1 TABLET(20 MG) BY MOUTH DAILY AT 6 PM  . busPIRone (BUSPAR) 5 MG tablet Take 1 tablet (5 mg total) by mouth 2 (two) times daily.  . cholecalciferol (VITAMIN D) 1000 UNITS tablet Take 2,000 Units by mouth daily.  Marland Kitchen ezetimibe (ZETIA) 10 MG tablet Take 1 tablet (10 mg total) by mouth daily.  Marland Kitchen levothyroxine (SYNTHROID, LEVOTHROID) 137 MCG tablet TAKE 1 TABLET(137 MCG) BY MOUTH EVERY MORNING  . Multiple Vitamin (MULITIVITAMIN WITH MINERALS) TABS Take 1 tablet by mouth daily.  . valsartan-hydrochlorothiazide (DIOVAN HCT) 160-12.5 MG tablet Take 1 tablet by mouth daily. In place of losartan because of recall  . XARELTO 20 MG TABS tablet TAKE 1 TABLET BY MOUTH DAILY WITH SUPPER  . [DISCONTINUED] diltiazem (CARDIZEM CD) 120 MG 24 hr capsule Take 1 capsule (120 mg total) by mouth 2 (two) times daily.  . [DISCONTINUED] metoprolol succinate (TOPROL-XL) 50 MG 24 hr tablet TAKE 1 TABLET (50 mg)  BY MOUTH TWICE DAILY WITH OR IMMEDIATELY FOLLOWING A MEAL    Allergies:   Codeine; Erythromycin; Penicillins; and Septra [sulfamethoxazole-trimethoprim]   Social History:  The patient  reports that she has never smoked. She has never used smokeless tobacco. She reports that she does not drink alcohol or use  drugs.   Family History:  The patient's family history includes Alzheimer's disease in her mother; Cancer in her father and paternal aunt; Stroke in her father; Thyroid disease in her maternal grandmother.  ROS:   Review of Systems  Constitutional: Positive for malaise/fatigue. Negative for chills, diaphoresis, fever and weight loss.  HENT: Negative for congestion.   Eyes: Negative for discharge and redness.  Respiratory: Positive for shortness of breath. Negative for cough, hemoptysis, sputum production and wheezing.   Cardiovascular: Positive for palpitations and leg swelling. Negative for chest pain, orthopnea, claudication and PND.  Gastrointestinal: Negative for abdominal pain, blood in stool, heartburn, melena, nausea and vomiting.  Genitourinary: Negative for hematuria.  Musculoskeletal: Negative for falls and myalgias.  Skin: Negative for rash.  Neurological:  Positive for weakness. Negative for dizziness, tingling, tremors, sensory change, speech change, focal weakness and loss of consciousness.  Endo/Heme/Allergies: Does not bruise/bleed easily.  Psychiatric/Behavioral: Negative for substance abuse. The patient is not nervous/anxious.   All other systems reviewed and are negative.    PHYSICAL EXAM:  VS:  BP (!) 144/70 (BP Location: Left Arm, Patient Position: Sitting, Cuff Size: Normal)   Pulse 84   Ht 5\' 6"  (1.676 m)   Wt 156 lb (70.8 kg)   SpO2 97%   BMI 25.18 kg/m  BMI: Body mass index is 25.18 kg/m.  Physical Exam  Constitutional: She is oriented to person, place, and time. She appears well-developed and well-nourished.  HENT:  Head: Normocephalic and atraumatic.  Eyes: Right eye exhibits no discharge. Left eye exhibits no discharge.  Neck: Normal range of motion. No JVD present.  Cardiovascular: Normal rate, S1 normal and S2 normal. An irregularly irregular rhythm present. Exam reveals no distant heart sounds, no friction rub, no midsystolic click and no opening  snap.  Murmur heard. High-pitched blowing holosystolic murmur is present with a grade of 2/6 at the apex. Pulses:      Posterior tibial pulses are 2+ on the right side, and 2+ on the left side.  Pulmonary/Chest: Effort normal and breath sounds normal. No respiratory distress. She has no decreased breath sounds. She has no wheezes. She has no rales. She exhibits no tenderness.  Abdominal: Soft. She exhibits no distension. There is no tenderness.  Musculoskeletal: She exhibits edema.  1+ bilateral lower extremity edema to the knees  Neurological: She is alert and oriented to person, place, and time.  Skin: Skin is warm and dry. No cyanosis. Nails show no clubbing.  Psychiatric: She has a normal mood and affect. Her speech is normal and behavior is normal. Judgment and thought content normal.     EKG:  Was ordered and interpreted by me today. Shows Afib, 77 bpm, LBBB  Recent Labs: 09/04/2017: TSH 3.97 03/21/2018: B Natriuretic Peptide 336.0; BUN 15; Creatinine, Ser 0.78; Hemoglobin 12.3; Platelets 299; Potassium 3.5; Sodium 139  09/04/2017: Cholesterol 173; HDL 60; LDL Cholesterol (Calc) 87; Total CHOL/HDL Ratio 2.9; Triglycerides 159   Estimated Creatinine Clearance: 56.9 mL/min (by C-G formula based on SCr of 0.78 mg/dL).   Wt Readings from Last 3 Encounters:  03/22/18 156 lb (70.8 kg)  03/21/18 157 lb 8 oz (71.4 kg)  03/13/18 156 lb 12 oz (71.1 kg)     Other studies reviewed: Additional studies/records reviewed today include: summarized above  ASSESSMENT AND PLAN:  1. Persistent Afib: She remains in Aifb and is quite symptomatic. She has been adequately anticoagulated since 11/2017 without missing any doses. TTE on 03/21/2018 showed acute systolic CHF with elevated PA pressures, elevated CVP, and moderate mitral regurgitation. She would benefit from restoring sinus rhythm, sooner rather than later. Given her cardiomyopathy and mildly dilated left atrium, I do have concerns that she  may not be able to hold sinus rhythm without the aid an AAD. Start amiodarone 400 mg bid x 1 week, followed by 200 mg bid x 1 week, then 200 mg daily thereafter. Recent TSH normal. Check LFTs. Side effects discussed. Increase Toprol XL to 75 mg bid. Stop diltiazem as this is likely playing a role in her lower extremity swelling. Continue Xarelto 20 mg q dinner. Scheduled for DCCV the following week.   2. Acute systolic CHF/pulmonary hypertension: She prefers to continue Diovan HCT and not add Lasix in the place of  HCTZ at this time. Hopefully, with restoration of sinus rhythm and stopping of her calcium channel blocker her volume status will improve. If it does not, we will likely need to add Lasix in place of her HCTZ. Continue Toprol XL and valsartan. Plan to recheck TTE in 1 month following restoration of sinus rhythm to evaluate for improved LVSF. If her EF remains low following restoration of sinus rhythm, would add spironolactone and she would require ischemia evaluation. CHF education.   3. Lower extremity swelling: Likely multifactorial including her acute CHF as above as well as in the setting of diltiazem. Stop diltiazem and diuresis as above. Elevate legs.   4. Mitral regurgitation: Recheck echo once sinus rhythm has been restored. Likely exacerbated by the above.   5. SVT: Noted on Holter monitor as above. Uncertain at this time if her home tachycardic readings are related to Afib with RVR vs paroxysmal SVT. Increase Toprol to 75 mg bid as well as add amiodarone as above. Recommend EP evaluation following DCCV for her Afib as above.   6. HLD: LDL of 87 from 08/2017. On Lipitor and Zetia.   7. Carotid artery disease: Followed by vascular. Lipitor and Zetia as above.  8. High risk medication: Recent BMP and CBC from 5/30 stable. Check LFT today. Will need routine ophthalmology checks. Monitor QTc in follow up.   Disposition: F/u with me 1 week following DCCV.   Current medicines are  reviewed at length with the patient today.  The patient did not have any concerns regarding medicines.  Signed, Christell Faith, PA-C 03/22/2018 12:32 PM     [address] [place], [place] [address] [phone]

## 2018-03-22 ENCOUNTER — Other Ambulatory Visit
Admission: RE | Admit: 2018-03-22 | Discharge: 2018-03-22 | Disposition: A | Discharge status: Home / Self Care | Payer: Medicare Other | Source: Ambulatory Visit | Attending: Physician Assistant | Admitting: Physician Assistant

## 2018-03-22 ENCOUNTER — Encounter: Payer: Self-pay | Admitting: Physician Assistant

## 2018-03-22 ENCOUNTER — Other Ambulatory Visit: Payer: Self-pay | Admitting: Physician Assistant

## 2018-03-22 ENCOUNTER — Ambulatory Visit: Payer: Medicare Other | Admitting: Physician Assistant

## 2018-03-22 VITALS — BP 144/70 | HR 84 | Ht 66.0 in | Wt 156.0 lb

## 2018-03-22 DIAGNOSIS — Z79899 Other long term (current) drug therapy: Secondary | ICD-10-CM | POA: Diagnosis not present

## 2018-03-22 DIAGNOSIS — E782 Mixed hyperlipidemia: Secondary | ICD-10-CM

## 2018-03-22 DIAGNOSIS — I4819 Other persistent atrial fibrillation: Secondary | ICD-10-CM

## 2018-03-22 DIAGNOSIS — I481 Persistent atrial fibrillation: Secondary | ICD-10-CM

## 2018-03-22 DIAGNOSIS — I5021 Acute systolic (congestive) heart failure: Secondary | ICD-10-CM | POA: Diagnosis not present

## 2018-03-22 DIAGNOSIS — I471 Supraventricular tachycardia: Secondary | ICD-10-CM

## 2018-03-22 DIAGNOSIS — I272 Pulmonary hypertension, unspecified: Secondary | ICD-10-CM | POA: Diagnosis not present

## 2018-03-22 DIAGNOSIS — I340 Nonrheumatic mitral (valve) insufficiency: Secondary | ICD-10-CM | POA: Diagnosis not present

## 2018-03-22 LAB — HEPATIC FUNCTION PANEL
ALK PHOS: 65 U/L (ref 38–126)
ALT: 25 U/L (ref 14–54)
AST: 29 U/L (ref 15–41)
Albumin: 4.3 g/dL (ref 3.5–5.0)
BILIRUBIN INDIRECT: 0.7 mg/dL (ref 0.3–0.9)
BILIRUBIN TOTAL: 0.8 mg/dL (ref 0.3–1.2)
Bilirubin, Direct: 0.1 mg/dL (ref 0.1–0.5)
Total Protein: 7.1 g/dL (ref 6.5–8.1)

## 2018-03-22 MED ORDER — METOPROLOL SUCCINATE ER 25 MG PO TB24: 75.0000 mg | ORAL_TABLET | Freq: Two times a day (BID) | ORAL | 6 refills | Status: DC

## 2018-03-22 MED ORDER — AMIODARONE HCL 200 MG PO TABS: ORAL_TABLET | ORAL | 3 refills | Status: DC

## 2018-03-22 NOTE — Addendum Note (Signed)
Addended by: Valora Corporal on: 03/22/2018 11:06 AM   Modules accepted: Orders

## 2018-03-22 NOTE — Patient Instructions (Addendum)
Medication Instructions:  Your physician has recommended you make the following change in your medication:   1- STOP Diltiazem. 2- START Amiodarone - Take 2 tablets (400 mg) by mouth two times a day for 1 week, then take 1 tablet (200 mg) by mouth two a day. 3 - Take metoprolol 75 mg by mouth two times a day.  Labwork: Your physician recommends that you return for lab work in: ADDED ON TO [place].   Testing/Procedures: Your physician has recommended that you have a Cardioversion (DCCV). Electrical Cardioversion uses a jolt of electricity to your heart either through paddles or wired patches attached to your chest. This is a controlled, usually prescheduled, procedure. Defibrillation is done under light anesthesia in the hospital, and you usually go home the day of the procedure. This is done to get your heart back into a normal rhythm. You are not awake for the procedure. Please see the instruction sheet given to you today.  You are scheduled for a Cardioversion on __6/5/2019__ with Dr._GOLLAN___  Please arrive at the [facility] at __9:00__ a.m. on the day of your procedure.  DIET INSTRUCTIONS:  Nothing to eat or drink after midnight except your medications with a SMALL sip of water.         1) Labs: ___DONE______  2) Medications:  YOU MAY TAKE ALL of your remaining medications with a small amount of water.  3) Must have a responsible person to drive you home.  4) Bring a current list of your medications and current insurance cards.    If you have any questions after you get home, please call the office at [phone]    Follow-Up: Your physician recommends that you schedule a follow-up appointment in: [place].  If you need a refill on your cardiac medications before your next appointment, please call your pharmacy.     Electrical Cardioversion Electrical cardioversion is the delivery of a jolt of  electricity to restore a normal rhythm to the heart. A rhythm that is too fast or is not regular keeps the heart from pumping well. In this procedure, sticky patches or metal paddles are placed on the chest to deliver electricity to the heart from a device. This procedure may be done in an emergency if:  There is low or no blood pressure as a result of the heart rhythm.  Normal rhythm must be restored as fast as possible to protect the brain and heart from further damage.  It may save a life.  This procedure may also be done for irregular or fast heart rhythms that are not immediately life-threatening. Tell a health care provider about:  Any allergies you have.  All medicines you are taking, including vitamins, herbs, eye drops, creams, and over-the-counter medicines.  Any problems you or family members have had with anesthetic medicines.  Any blood disorders you have.  Any surgeries you have had.  Any medical conditions you have.  Whether you are pregnant or may be pregnant. What are the risks? Generally, this is a safe procedure. However, problems may occur, including:  Allergic reactions to medicines.  A blood clot that breaks free and travels to other parts of your body.  The possible return of an abnormal heart rhythm within hours or days after the procedure.  Your heart stopping (cardiac arrest). This is rare.  What happens before the procedure? Medicines  Your health care provider may have you start  taking: ? Blood-thinning medicines (anticoagulants) so your blood does not clot as easily. ? Medicines may be given to help stabilize your heart rate and rhythm.  Ask your health care provider about changing or stopping your regular medicines. This is especially important if you are taking diabetes medicines or blood thinners. General instructions  Plan to have someone take you home from the hospital or clinic.  If you will be going home right after the procedure,  plan to have someone with you for 24 hours.  Follow instructions from your health care provider about eating or drinking restrictions. What happens during the procedure?  To lower your risk of infection: ? Your health care team will wash or sanitize their hands. ? Your skin will be washed with soap.  An IV tube will be inserted into one of your veins.  You will be given a medicine to help you relax (sedative).  Sticky patches (electrodes) or metal paddles may be placed on your chest.  An electrical shock will be delivered. The procedure may vary among health care providers and hospitals. What happens after the procedure?  Your blood pressure, heart rate, breathing rate, and blood oxygen level will be monitored until the medicines you were given have worn off.  Do not drive for 24 hours if you were given a sedative.  Your heart rhythm will be watched to make sure it does not change. This information is not intended to replace advice given to you by your health care provider. Make sure you discuss any questions you have with your health care provider. Document Released: 09/29/2002 Document Revised: 06/07/2016 Document Reviewed: 04/14/2016 Elsevier Interactive Patient Education  2017 [employer].

## 2018-03-22 NOTE — Progress Notes (Signed)
Orders for cardioversion have been placed.

## 2018-03-25 ENCOUNTER — Encounter: Payer: Self-pay | Admitting: Nurse Practitioner

## 2018-03-25 ENCOUNTER — Emergency Department: Payer: Medicare Other

## 2018-03-25 ENCOUNTER — Inpatient Hospital Stay
Admission: EM | Admit: 2018-03-25 | Discharge: 2018-03-27 | DRG: 308 | Disposition: A | Discharge status: Home / Self Care | Payer: Medicare Other | Attending: Specialist | Admitting: Specialist

## 2018-03-25 ENCOUNTER — Other Ambulatory Visit: Payer: Self-pay

## 2018-03-25 DIAGNOSIS — Z880 Allergy status to penicillin: Secondary | ICD-10-CM | POA: Diagnosis not present

## 2018-03-25 DIAGNOSIS — I4819 Other persistent atrial fibrillation: Secondary | ICD-10-CM

## 2018-03-25 DIAGNOSIS — I272 Pulmonary hypertension, unspecified: Secondary | ICD-10-CM | POA: Diagnosis not present

## 2018-03-25 DIAGNOSIS — M47812 Spondylosis without myelopathy or radiculopathy, cervical region: Secondary | ICD-10-CM | POA: Diagnosis not present

## 2018-03-25 DIAGNOSIS — R079 Chest pain, unspecified: Secondary | ICD-10-CM | POA: Diagnosis not present

## 2018-03-25 DIAGNOSIS — Z823 Family history of stroke: Secondary | ICD-10-CM

## 2018-03-25 DIAGNOSIS — R0989 Other specified symptoms and signs involving the circulatory and respiratory systems: Secondary | ICD-10-CM

## 2018-03-25 DIAGNOSIS — I482 Chronic atrial fibrillation: Secondary | ICD-10-CM | POA: Diagnosis present

## 2018-03-25 DIAGNOSIS — R0602 Shortness of breath: Secondary | ICD-10-CM | POA: Diagnosis not present

## 2018-03-25 DIAGNOSIS — Z881 Allergy status to other antibiotic agents status: Secondary | ICD-10-CM

## 2018-03-25 DIAGNOSIS — E785 Hyperlipidemia, unspecified: Secondary | ICD-10-CM | POA: Diagnosis not present

## 2018-03-25 DIAGNOSIS — I110 Hypertensive heart disease with heart failure: Secondary | ICD-10-CM | POA: Diagnosis present

## 2018-03-25 DIAGNOSIS — Z7901 Long term (current) use of anticoagulants: Secondary | ICD-10-CM | POA: Diagnosis not present

## 2018-03-25 DIAGNOSIS — I739 Peripheral vascular disease, unspecified: Secondary | ICD-10-CM | POA: Diagnosis present

## 2018-03-25 DIAGNOSIS — Z79890 Hormone replacement therapy (postmenopausal): Secondary | ICD-10-CM | POA: Diagnosis not present

## 2018-03-25 DIAGNOSIS — Z820 Family history of epilepsy and other diseases of the nervous system: Secondary | ICD-10-CM

## 2018-03-25 DIAGNOSIS — I480 Paroxysmal atrial fibrillation: Secondary | ICD-10-CM | POA: Diagnosis not present

## 2018-03-25 DIAGNOSIS — I5041 Acute combined systolic (congestive) and diastolic (congestive) heart failure: Secondary | ICD-10-CM | POA: Diagnosis not present

## 2018-03-25 DIAGNOSIS — Z79899 Other long term (current) drug therapy: Secondary | ICD-10-CM

## 2018-03-25 DIAGNOSIS — I5033 Acute on chronic diastolic (congestive) heart failure: Secondary | ICD-10-CM | POA: Diagnosis not present

## 2018-03-25 DIAGNOSIS — E039 Hypothyroidism, unspecified: Secondary | ICD-10-CM | POA: Diagnosis present

## 2018-03-25 DIAGNOSIS — I5043 Acute on chronic combined systolic (congestive) and diastolic (congestive) heart failure: Secondary | ICD-10-CM | POA: Diagnosis present

## 2018-03-25 DIAGNOSIS — I5023 Acute on chronic systolic (congestive) heart failure: Secondary | ICD-10-CM

## 2018-03-25 DIAGNOSIS — I481 Persistent atrial fibrillation: Principal | ICD-10-CM | POA: Diagnosis present

## 2018-03-25 DIAGNOSIS — Z885 Allergy status to narcotic agent status: Secondary | ICD-10-CM

## 2018-03-25 DIAGNOSIS — Z882 Allergy status to sulfonamides status: Secondary | ICD-10-CM | POA: Diagnosis not present

## 2018-03-25 DIAGNOSIS — I509 Heart failure, unspecified: Secondary | ICD-10-CM | POA: Diagnosis not present

## 2018-03-25 DIAGNOSIS — Z809 Family history of malignant neoplasm, unspecified: Secondary | ICD-10-CM | POA: Diagnosis not present

## 2018-03-25 DIAGNOSIS — I447 Left bundle-branch block, unspecified: Secondary | ICD-10-CM | POA: Diagnosis present

## 2018-03-25 DIAGNOSIS — I4891 Unspecified atrial fibrillation: Secondary | ICD-10-CM | POA: Diagnosis not present

## 2018-03-25 DIAGNOSIS — I10 Essential (primary) hypertension: Secondary | ICD-10-CM | POA: Diagnosis not present

## 2018-03-25 HISTORY — DX: Pain in leg, unspecified: M79.606

## 2018-03-25 HISTORY — DX: Other persistent atrial fibrillation: I48.19

## 2018-03-25 HISTORY — DX: Personal history of other medical treatment: Z92.89

## 2018-03-25 HISTORY — DX: Chronic combined systolic (congestive) and diastolic (congestive) heart failure: I50.42

## 2018-03-25 LAB — BASIC METABOLIC PANEL
Anion gap: 9 (ref 5–15)
BUN: 21 mg/dL — ABNORMAL HIGH (ref 6–20)
CALCIUM: 8.9 mg/dL (ref 8.9–10.3)
CHLORIDE: 106 mmol/L (ref 101–111)
CO2: 22 mmol/L (ref 22–32)
CREATININE: 0.79 mg/dL (ref 0.44–1.00)
GFR calc Af Amer: >60 mL/min (ref >60)
GFR calc non Af Amer: >60 mL/min (ref >60)
GLUCOSE: 163 mg/dL — AB (ref 65–99)
Potassium: 3.6 mmol/L (ref 3.5–5.1)
Sodium: 137 mmol/L (ref 135–145)

## 2018-03-25 LAB — CBC
HCT: 36.9 % (ref 35.0–47.0)
Hemoglobin: 12.8 g/dL (ref 12.0–16.0)
MCH: 31.9 pg (ref 26.0–34.0)
MCHC: 34.7 g/dL (ref 32.0–36.0)
MCV: 91.8 fL (ref 80.0–100.0)
PLATELETS: 322 10*3/uL (ref 150–440)
RBC: 4.02 MIL/uL (ref 3.80–5.20)
RDW: 14 % (ref 11.5–14.5)
WBC: 9.6 10*3/uL (ref 3.6–11.0)

## 2018-03-25 LAB — TROPONIN I
Troponin I: <0.03 ng/mL (ref <0.03)
Troponin I: <0.03 ng/mL (ref <0.03)

## 2018-03-25 LAB — BRAIN NATRIURETIC PEPTIDE: B Natriuretic Peptide: 452 pg/mL — ABNORMAL HIGH (ref 0.0–100.0)

## 2018-03-25 MED ORDER — ACETAMINOPHEN 325 MG PO TABS
650.0000 mg | ORAL_TABLET | Freq: Four times a day (QID) | ORAL | Status: DC | PRN
  Administered 2018-03-25 – 2018-03-27 (×4): 650 mg via ORAL
  Filled 2018-03-25 (×5): qty 2

## 2018-03-25 MED ORDER — RIVAROXABAN 20 MG PO TABS
20.0000 mg | ORAL_TABLET | Freq: Every day | ORAL | Status: DC
  Administered 2018-03-25 – 2018-03-26 (×2): 20 mg via ORAL
  Filled 2018-03-25 (×3): qty 1

## 2018-03-25 MED ORDER — EZETIMIBE 10 MG PO TABS
10.0000 mg | ORAL_TABLET | Freq: Every day | ORAL | Status: DC
  Administered 2018-03-25 – 2018-03-27 (×3): 10 mg via ORAL
  Filled 2018-03-25 (×3): qty 1

## 2018-03-25 MED ORDER — METOPROLOL SUCCINATE ER 50 MG PO TB24
75.0000 mg | ORAL_TABLET | Freq: Two times a day (BID) | ORAL | Status: DC
  Administered 2018-03-25 (×2): 75 mg via ORAL
  Filled 2018-03-25 (×2): qty 2

## 2018-03-25 MED ORDER — FUROSEMIDE 10 MG/ML IJ SOLN
20.0000 mg | Freq: Two times a day (BID) | INTRAMUSCULAR | Status: DC
  Administered 2018-03-25: 20 mg via INTRAVENOUS
  Filled 2018-03-25: qty 2

## 2018-03-25 MED ORDER — ASPIRIN 81 MG PO CHEW
324.0000 mg | CHEWABLE_TABLET | Freq: Once | ORAL | Status: AC
  Administered 2018-03-25: 324 mg via ORAL
  Filled 2018-03-25: qty 4

## 2018-03-25 MED ORDER — ONDANSETRON HCL 4 MG PO TABS: 4.0000 mg | ORAL_TABLET | Freq: Four times a day (QID) | ORAL | Status: DC | PRN

## 2018-03-25 MED ORDER — BENZONATATE 100 MG PO CAPS
200.0000 mg | ORAL_CAPSULE | Freq: Three times a day (TID) | ORAL | Status: DC | PRN
  Administered 2018-03-25 – 2018-03-26 (×3): 200 mg via ORAL
  Filled 2018-03-25 (×3): qty 2

## 2018-03-25 MED ORDER — LEVOTHYROXINE SODIUM 137 MCG PO TABS
137.0000 ug | ORAL_TABLET | Freq: Every day | ORAL | Status: DC
  Administered 2018-03-25 – 2018-03-27 (×3): 137 ug via ORAL
  Filled 2018-03-25 (×3): qty 1

## 2018-03-25 MED ORDER — AMIODARONE HCL 200 MG PO TABS: 200.0000 mg | ORAL_TABLET | Freq: Two times a day (BID) | ORAL | Status: DC

## 2018-03-25 MED ORDER — VITAMIN D3 25 MCG (1000 UNIT) PO TABS
2000.0000 [IU] | ORAL_TABLET | Freq: Every day | ORAL | Status: DC
Start: 2018-03-25 — End: 2018-03-27
  Administered 2018-03-25 – 2018-03-27 (×3): 2000 [IU] via ORAL
  Filled 2018-03-25 (×3): qty 2

## 2018-03-25 MED ORDER — TRAZODONE HCL 50 MG PO TABS
25.0000 mg | ORAL_TABLET | Freq: Every evening | ORAL | Status: DC | PRN
  Administered 2018-03-25 – 2018-03-26 (×2): 25 mg via ORAL
  Filled 2018-03-25 (×2): qty 1

## 2018-03-25 MED ORDER — NITROGLYCERIN 2 % TD OINT
1.0000 [in_us] | TOPICAL_OINTMENT | Freq: Once | TRANSDERMAL | Status: AC
  Administered 2018-03-25: 1 [in_us] via TOPICAL
  Filled 2018-03-25: qty 1

## 2018-03-25 MED ORDER — DOCUSATE SODIUM 100 MG PO CAPS
100.0000 mg | ORAL_CAPSULE | Freq: Two times a day (BID) | ORAL | Status: DC
  Administered 2018-03-25 – 2018-03-27 (×4): 100 mg via ORAL
  Filled 2018-03-25 (×5): qty 1

## 2018-03-25 MED ORDER — FUROSEMIDE 10 MG/ML IJ SOLN
20.0000 mg | Freq: Once | INTRAMUSCULAR | Status: AC
  Administered 2018-03-25: 20 mg via INTRAVENOUS
  Filled 2018-03-25: qty 4

## 2018-03-25 MED ORDER — BISACODYL 5 MG PO TBEC: 5.0000 mg | DELAYED_RELEASE_TABLET | Freq: Every day | ORAL | Status: DC | PRN

## 2018-03-25 MED ORDER — ACETAMINOPHEN 650 MG RE SUPP: 650.0000 mg | Freq: Four times a day (QID) | RECTAL | Status: DC | PRN

## 2018-03-25 MED ORDER — SODIUM CHLORIDE 0.9% FLUSH
3.0000 mL | Freq: Two times a day (BID) | INTRAVENOUS | Status: DC
  Administered 2018-03-25 – 2018-03-27 (×3): 3 mL via INTRAVENOUS

## 2018-03-25 MED ORDER — ADULT MULTIVITAMIN W/MINERALS CH
1.0000 | ORAL_TABLET | Freq: Every day | ORAL | Status: DC
  Administered 2018-03-25 – 2018-03-27 (×3): 1 via ORAL
  Filled 2018-03-25 (×3): qty 1

## 2018-03-25 MED ORDER — ONDANSETRON HCL 4 MG/2ML IJ SOLN: 4.0000 mg | Freq: Four times a day (QID) | INTRAMUSCULAR | Status: DC | PRN

## 2018-03-25 MED ORDER — FUROSEMIDE 10 MG/ML IJ SOLN: 40.0000 mg | Freq: Two times a day (BID) | INTRAMUSCULAR | Status: DC

## 2018-03-25 MED ORDER — FUROSEMIDE 10 MG/ML IJ SOLN: 20.0000 mg | Freq: Every day | INTRAMUSCULAR | Status: DC

## 2018-03-25 MED ORDER — IRBESARTAN 150 MG PO TABS
150.0000 mg | ORAL_TABLET | Freq: Every day | ORAL | Status: DC
  Administered 2018-03-25 – 2018-03-27 (×3): 150 mg via ORAL
  Filled 2018-03-25 (×3): qty 1

## 2018-03-25 MED ORDER — ATORVASTATIN CALCIUM 20 MG PO TABS
10.0000 mg | ORAL_TABLET | Freq: Every day | ORAL | Status: DC
  Administered 2018-03-25 – 2018-03-26 (×2): 10 mg via ORAL
  Filled 2018-03-25 (×2): qty 1

## 2018-03-25 MED ORDER — SODIUM CHLORIDE 0.9 % IV SOLN
250.0000 mL | INTRAVENOUS | Status: DC
  Administered 2018-03-26: 10:00:00 via INTRAVENOUS

## 2018-03-25 MED ORDER — AMIODARONE HCL 200 MG PO TABS
400.0000 mg | ORAL_TABLET | Freq: Two times a day (BID) | ORAL | Status: DC
  Administered 2018-03-25 – 2018-03-27 (×5): 400 mg via ORAL
  Filled 2018-03-25 (×5): qty 2

## 2018-03-25 MED ORDER — BUSPIRONE HCL 5 MG PO TABS
5.0000 mg | ORAL_TABLET | Freq: Two times a day (BID) | ORAL | Status: DC
  Administered 2018-03-25 – 2018-03-27 (×5): 5 mg via ORAL
  Filled 2018-03-25 (×6): qty 1

## 2018-03-25 MED ORDER — SODIUM CHLORIDE 0.9% FLUSH: 3.0000 mL | INTRAVENOUS | Status: DC | PRN

## 2018-03-25 NOTE — Care Management (Signed)
From home.  Admitted withy atrial fib with rvr.  Was to have had elective cardioversion this week. She has been very symptomatic with her atrial fib. Chronic Xarelto.  Receiving IV lasix for acute heart failure. Current with her cardiologist and pcp. No issues accessing medical care, transportation or obtaining medications.  Cariology consult is pending

## 2018-03-25 NOTE — Progress Notes (Signed)
Talked to Dr. Jannifer Franklin about patient's complaints of cough, order for Tessalon, PRN given. RN will continue to monitor.

## 2018-03-25 NOTE — ED Notes (Signed)
Patient placed on 2L [place] per MD VO.

## 2018-03-25 NOTE — ED Notes (Addendum)
This RN ambulated patient and her pulse increased to 139, and oxygen saturation decreased to 91% on room air.  MD notified.

## 2018-03-25 NOTE — ED Triage Notes (Signed)
Patient reports Saturday and Sunday night having shortness of breath, states "it feels like I can hear a rattle".  Patient reports schedule for cardiac cardioversion on Wednesday for A Fib.

## 2018-03-25 NOTE — Consult Note (Signed)
Cardiology Consult    Patient ID: LESHIA KOPE MRN: [id number], DOB/AGE: 76-30-1942   Admit date: 03/25/2018 Date of Consult: 03/25/2018  Primary Physician: Steele Sizer, MD Primary Cardiologist: Ida Rogue, MD Requesting Provider: Governor Specking, MD  Patient Profile    [name] is a 77 y.o. female with a history of AVNRT s/p RFCA, PAF (Xarelto), LBBB, HTN, HL, DJD, leg pain w/ nl ABI's (01/2018), HFpEF, and mild carotid dzs, who is being seen today for the evaluation of afib and CHF at the request of Dr. Vianne Bulls.  Past Medical History   Past Medical History:  Diagnosis Date  . Bronchitis   . Chronic combined systolic (congestive) and diastolic (congestive) heart failure ([place])    a. 02/2018 Echo: EF 45-50%, diff HK, triv AI, mod MR, mildly dil LA/RA, nl RV fxn, mod TR. PASP 40-34mmHg.  Marland Kitchen DJD (degenerative joint disease), cervical   . History of stress test    a. 01/2008 MV: EF 76%. Fair ex tol. No ischemia.  . Hyperlipidemia   . Hypertension   . Hypothyroid   . LBBB (left bundle branch block)   . Leg pain    a. 01/2018 ABI's wnl.  . Persistent atrial fibrillation ([place])    a. 01/2018 Event monitor: 45 runs of SVT, longes 14.5 sec. SVT felt to be Afib/flutter-->2% burden. Longest run of AF 4h 64m (CHA2DS2VASc = 4-->Xarelto).  . SVT (supraventricular tachycardia) ([place])    a. AVNRT - s/p ablation by Dr Lovena Le 5/13    Past Surgical History:  Procedure Laterality Date  . ELECTROPHYSIOLOGY STUDY N/A 02/27/2012   Procedure: ELECTROPHYSIOLOGY STUDY;  Surgeon: Evans Lance, MD;  Location: [facility] CATH LAB;  Service: Cardiovascular;  Laterality: N/A;  . EPS and ablation for SVT  5/13   slow pathway ablation by Dr Lovena Le  . EYE SURGERY     surgery for detatched retina  . SUPRAVENTRICULAR TACHYCARDIA ABLATION N/A 02/27/2012   Procedure: SUPRAVENTRICULAR TACHYCARDIA ABLATION;  Surgeon: Evans Lance, MD;  Location: [facility] CATH LAB;  Service: Cardiovascular;  Laterality: N/A;      Allergies  Allergies  Allergen Reactions  . Codeine Nausea And Vomiting  . Erythromycin Itching and Swelling  . Septra [Sulfamethoxazole-Trimethoprim] Swelling  . Penicillins Swelling and Rash    Has patient had a PCN reaction causing immediate rash, facial/tongue/throat swelling, SOB or lightheadedness with hypotension: Yes Has patient had a PCN reaction causing severe rash involving mucus membranes or skin necrosis: No Has patient had a PCN reaction that required hospitalization: No Has patient had a PCN reaction occurring within the last 10 years: No If all of the above answers are "NO", then may proceed with Cephalosporin use.     History of Present Illness    77 year old female with the above complex past medical history including AVNRT/SVT status post catheter ablation in 2013, left bundle branch block, hypertension, hyperlipidemia, hypothyroidism, and more recent findings of persistent atrial fibrillation and chronic combined heart failure.  In February 2019, she reported palpitations and was noted on 12-lead ECG to have frequent PVCs and fusion beats.  She was placed on event monitoring in April which showed runs of SVT as well as runs of atrial fibrillation.  She has been anticoagulated with Xarelto.  When she was seen in clinic on May 13, she was noted to be in atrial fibrillation with rapid ventricular response and her Toprol was increased.  Amlodipine was discontinued in favor of diltiazem.  She remained in atrial fibrillation  on nurse visit May 16 at which time diltiazem was increased further to 120 mill grams twice daily.  She was seen in the office on May 31 with progressive swelling and weight gain.  She remained in atrial fibrillation and it was felt that she would require cardioversion.  She was placed on amiodarone therapy and metoprolol therapy was increased.  She did not wish to start Lasix.  Unfortunately, over the weekend, she had 2 episodes of significant dyspnea  including one that occurred this morning at 1:30 AM, awakening her from sleep.  She noted wheezing and coarse breath sounds, which prompted her to call her boyfriend who then came and took her to the emergency department.  Here, she was in rapid atrial fibrillation.  Chest x-ray showed pulmonary vascular congestion and bronchitic changes as well as bibasilar consolidation with small right pleural effusion.  She was admitted for further evaluation.  She has responded well to IV diuresis and is down 4 pounds since admission.  Inpatient Medications    . amiodarone  400 mg Oral BID  . atorvastatin  10 mg Oral q1800  . busPIRone  5 mg Oral BID  . cholecalciferol  2,000 Units Oral Daily  . docusate sodium  100 mg Oral BID  . ezetimibe  10 mg Oral Daily  . [START ON 03/26/2018] furosemide  20 mg Intravenous Daily  . furosemide  40 mg Intravenous Q12H  . levothyroxine  137 mcg Oral QAC breakfast  . metoprolol succinate  75 mg Oral BID  . multivitamin with minerals  1 tablet Oral Daily  . rivaroxaban  20 mg Oral Daily    Family History    Family History  Problem Relation Age of Onset  . Alzheimer's disease Mother   . Cancer Father   . Stroke Father   . Cancer Paternal Aunt   . Thyroid disease Maternal Grandmother    indicated that her mother is deceased. She indicated that her father is deceased. She indicated that the status of her maternal grandmother is unknown. She indicated that the status of her paternal aunt is unknown.   Social History    Social History   Socioeconomic History  . Marital status: Widowed    Spouse name: Not on file  . Number of children: Not on file  . Years of education: Not on file  . Highest education level: Not on file  Occupational History  . Not on file  Social Needs  . Financial resource strain: Not on file  . Food insecurity:    Worry: Not on file    Inability: Not on file  . Transportation needs:    Medical: Not on file    Non-medical: Not on  file  Tobacco Use  . Smoking status: Never Smoker  . Smokeless tobacco: Never Used  Substance and Sexual Activity  . Alcohol use: No  . Drug use: No  . Sexual activity: Not on file  Lifestyle  . Physical activity:    Days per week: Not on file    Minutes per session: Not on file  . Stress: Not on file  Relationships  . Social connections:    Talks on phone: Not on file    Gets together: Not on file    Attends religious service: Not on file    Active member of club or organization: Not on file    Attends meetings of clubs or organizations: Not on file    Relationship status: Not on file  .  Intimate partner violence:    Fear of current or ex partner: Not on file    Emotionally abused: Not on file    Physically abused: Not on file    Forced sexual activity: Not on file  Other Topics Concern  . Not on file  Social History Narrative   Lives in [place]:  No chills, fever, night sweats or weight changes.  Cardiovascular:  No chest pain, +++ dyspnea on exertion, +++ edema, +++ orthopnea, +++ palpitations, +++ paroxysmal nocturnal dyspnea. Dermatological: No rash, lesions/masses Respiratory: No cough, +++ dyspnea Urologic: No hematuria, dysuria Abdominal:   No nausea, vomiting, diarrhea, bright red blood per rectum, melena, or hematemesis Neurologic:  No visual changes, wkns, changes in mental status. All other systems reviewed and are otherwise negative except as noted above.  Physical Exam    Blood pressure (!) 147/119, pulse (!) 109, temperature (!) 97.5 F (36.4 C), temperature source Oral, resp. rate (!) 21, weight 153 lb (69.4 kg), SpO2 97 %.  General: Pleasant, NAD Psych: Normal affect. Neuro: Alert and oriented X 3. Moves all extremities spontaneously. HEENT: Normal  Neck: Supple without bruits.  JVP ~ 10 cm Lungs:  Resp regular and unlabored, crackles 1/2 way up bilat. Heart: IR, IR no s3, s4, or murmurs. Abdomen: Soft,  non-tender, non-distended, BS + x 4.  Extremities: No clubbing, cyanosis or edema. DP/PT/Radials 2+ and equal bilaterally.  Labs     Recent Labs    03/25/18 0537 03/25/18 0829  TROPONINI <0.03 <0.03   Lab Results  Component Value Date   WBC 9.6 03/25/2018   HGB 12.8 03/25/2018   HCT 36.9 03/25/2018   MCV 91.8 03/25/2018   PLT 322 03/25/2018    Recent Labs  Lab 03/21/18 0816 03/25/18 0537  NA 139 137  K 3.5 3.6  CL 104 106  CO2 26 22  BUN 15 21*  CREATININE 0.78 0.79  CALCIUM 9.1 8.9  PROT 7.1  --   BILITOT 0.8  --   ALKPHOS 65  --   ALT 25  --   AST 29  --   GLUCOSE 145* 163*   Lab Results  Component Value Date   CHOL 173 09/04/2017   HDL 60 09/04/2017   LDLCALC 87 09/04/2017   TRIG 159 (H) 09/04/2017     Radiology Studies    Dg Chest 2 View  Result Date: 03/25/2018 CLINICAL DATA:  Shortness of breath. History of atrial fibrillation, bronchitis. EXAM: CHEST - 2 VIEW COMPARISON:  Chest radiograph Feb 23, 2012 FINDINGS: Cardiac silhouette is moderately enlarged. Calcified aortic arch. Pulmonary vascular congestion. Bronchitic changes. Small RIGHT pleural effusion with bibasilar consolidation. No pneumothorax. Soft tissue planes and included osseous structures are nonsuspicious. IMPRESSION: Similar cardiomegaly with pulmonary vascular congestion and bronchitic changes. Bibasilar consolidation with small RIGHT pleural effusion. Aortic Atherosclerosis (ICD10-I70.0). Electronically Signed   By: Elon Alas M.D.   On: 03/25/2018 05:53   Mr Cervical Spine Wo Contrast  Result Date: 03/16/2018 CLINICAL DATA:  SIX-MONTH HISTORY OF CHRONIC CERVICAL PAIN WITH CRACKING AND POPPING. THREE MONTH HISTORY OF LOW BACK PAIN EXTENDING INTO THE RIGHT LOWER EXTREMITY. RIGHT LOWER EXTREMITY TINGLING. EXAM: MRI CERVICAL AND LUMBAR SPINE WITHOUT CONTRAST TECHNIQUE: Multiplanar and multiecho pulse sequences of the cervical spine, to include the craniocervical junction and  cervicothoracic junction, and lumbar spine, were obtained without intravenous contrast. COMPARISON:  Cervical spine radiographs 12/04/2017. FINDINGS: MRI CERVICAL SPINE FINDINGS Alignment: Degenerative  anterolisthesis is again noted at C3-4. More mild anterolisthesis at C4-5 is stable. Retrolisthesis at C5-6 is noted. Vertebrae: Chronic endplate marrow changes are present at C5-6 greater than C6-7. Marrow signal and vertebral body heights are otherwise normal. Cord: Normal signal is present in the cervical and upper thoracic spinal cord to the lowest imaged level, T2-3. There is some artifact over the cord on the sagittal images. Posterior Fossa, vertebral arteries, paraspinal tissues: Craniocervical junction is normal. The visualized intracranial contents are normal. Flow is present in the vertebral arteries bilaterally. The right vertebral artery is dominant. Paraspinous soft tissues are within normal limits. Musculature is unremarkable. Disc levels: C2-3: Asymmetric facet hypertrophy is present on the right. This contributes to mild right foraminal narrowing. Central canal and left foramen are patent. C3-4: There is uncovering of a broad-based disc osteophyte complex. Advanced facet hypertrophy and spurring is present on the right. This results in severe right and moderate left foraminal stenosis. There is partial effacement of the ventral CSF. C4-5: The central disc protrusion effaces the ventral CSF. Asymmetric facet hypertrophy is present on the right. Mild foraminal narrowing is worse on the right. C5-6: A broad-based disc osteophyte complex is present. Uncovertebral spurring is asymmetric on the left. Moderate facet hypertrophy is noted bilaterally. Severe left and moderate right foraminal narrowing is present. There is partial effacement of the ventral CSF. C6-7: A broad-based disc osteophyte complex partially effaces the ventral CSF. Uncovertebral spurring contributes to moderate foraminal narrowing  bilaterally, slightly worse on the left. C7-T1: Negative. A perineural root sleeve cyst is noted on the left at T1-2. MRI LUMBAR SPINE FINDINGS Segmentation: 5 non rib-bearing lumbar type vertebral bodies are present. The lowest fully formed vertebral body is L5. Alignment: AP alignment is anatomic. There is slight retrolisthesis at L1-2. Mild leftward curvature is centered at L3. Vertebrae:  Marrow signal and vertebral body heights are normal. Conus medullaris and cauda equina: Conus extends to the L1 level. Conus and cauda equina appear normal. Paraspinal and other soft tissues: Limited imaging of the abdomen is unremarkable. There is no significant adenopathy. Disc levels: L1-2: Mild disc bulging is present bilaterally at L1-2 without significant focal stenosis. L2-3: A large right paramedian disc protrusion is present. This results in severe right lateral recess and subarticular stenosis. Mild left subarticular narrowing is present. Mild foraminal narrowing is present on the right. L3-4: A mild broad-based disc protrusion is asymmetric to the left. Mild left subarticular narrowing is present. The foramina are patent bilaterally. L4-5: A leftward disc protrusion is present. Asymmetric left-sided facet hypertrophy and spurring is noted. This results an moderate left subarticular and foraminal stenosis. Mild right subarticular and foraminal narrowing is present. L5-S1: Moderate facet hypertrophy is worse on the right. There is no significant stenosis. IMPRESSION: 1. Multilevel spondylosis of the cervical spine as described. 2. Mild right foraminal narrowing at C2-3 due to asymmetric facet hypertrophy. 3. Advanced right-sided facet hypertrophy at C3-4 and C4-5. 4. Severe right and moderate left foraminal narrowing at C3-4. 5. Mild foraminal narrowing at C4-5 is worse on the right. 6. Severe left and moderate right foraminal narrowing at C5-6. 7. Moderate foraminal narrowing bilaterally at C6-7 is slightly worse on  the left. 8. Mild central canal narrowing at multiple levels is most significant at C4-5. 9. Large right paramedian disc protrusion at L2-3 with severe right subarticular stenosis, likely impacting the right L3 nerve root. 10. Mild left subarticular and right foraminal narrowing at L2-3. 11. Mild left subarticular narrowing at L3-4  without significant foraminal stenosis. 12. Moderate left and mild right subarticular and foraminal stenosis at L4-5 secondary to a leftward disc protrusion. This potentially impacts the L4 and L5 nerve roots. 13. Asymmetric right-sided facet hypertrophy at L5-S1 without focal stenosis. Electronically Signed   By: San Morelle M.D.   On: 03/16/2018 14:39   Mr Lumbar Spine Wo Contrast  Result Date: 03/16/2018 CLINICAL DATA:  SIX-MONTH HISTORY OF CHRONIC CERVICAL PAIN WITH CRACKING AND POPPING. THREE MONTH HISTORY OF LOW BACK PAIN EXTENDING INTO THE RIGHT LOWER EXTREMITY. RIGHT LOWER EXTREMITY TINGLING. EXAM: MRI CERVICAL AND LUMBAR SPINE WITHOUT CONTRAST TECHNIQUE: Multiplanar and multiecho pulse sequences of the cervical spine, to include the craniocervical junction and cervicothoracic junction, and lumbar spine, were obtained without intravenous contrast. COMPARISON:  Cervical spine radiographs 12/04/2017. FINDINGS: MRI CERVICAL SPINE FINDINGS Alignment: Degenerative anterolisthesis is again noted at C3-4. More mild anterolisthesis at C4-5 is stable. Retrolisthesis at C5-6 is noted. Vertebrae: Chronic endplate marrow changes are present at C5-6 greater than C6-7. Marrow signal and vertebral body heights are otherwise normal. Cord: Normal signal is present in the cervical and upper thoracic spinal cord to the lowest imaged level, T2-3. There is some artifact over the cord on the sagittal images. Posterior Fossa, vertebral arteries, paraspinal tissues: Craniocervical junction is normal. The visualized intracranial contents are normal. Flow is present in the vertebral arteries  bilaterally. The right vertebral artery is dominant. Paraspinous soft tissues are within normal limits. Musculature is unremarkable. Disc levels: C2-3: Asymmetric facet hypertrophy is present on the right. This contributes to mild right foraminal narrowing. Central canal and left foramen are patent. C3-4: There is uncovering of a broad-based disc osteophyte complex. Advanced facet hypertrophy and spurring is present on the right. This results in severe right and moderate left foraminal stenosis. There is partial effacement of the ventral CSF. C4-5: The central disc protrusion effaces the ventral CSF. Asymmetric facet hypertrophy is present on the right. Mild foraminal narrowing is worse on the right. C5-6: A broad-based disc osteophyte complex is present. Uncovertebral spurring is asymmetric on the left. Moderate facet hypertrophy is noted bilaterally. Severe left and moderate right foraminal narrowing is present. There is partial effacement of the ventral CSF. C6-7: A broad-based disc osteophyte complex partially effaces the ventral CSF. Uncovertebral spurring contributes to moderate foraminal narrowing bilaterally, slightly worse on the left. C7-T1: Negative. A perineural root sleeve cyst is noted on the left at T1-2. MRI LUMBAR SPINE FINDINGS Segmentation: 5 non rib-bearing lumbar type vertebral bodies are present. The lowest fully formed vertebral body is L5. Alignment: AP alignment is anatomic. There is slight retrolisthesis at L1-2. Mild leftward curvature is centered at L3. Vertebrae:  Marrow signal and vertebral body heights are normal. Conus medullaris and cauda equina: Conus extends to the L1 level. Conus and cauda equina appear normal. Paraspinal and other soft tissues: Limited imaging of the abdomen is unremarkable. There is no significant adenopathy. Disc levels: L1-2: Mild disc bulging is present bilaterally at L1-2 without significant focal stenosis. L2-3: A large right paramedian disc protrusion is  present. This results in severe right lateral recess and subarticular stenosis. Mild left subarticular narrowing is present. Mild foraminal narrowing is present on the right. L3-4: A mild broad-based disc protrusion is asymmetric to the left. Mild left subarticular narrowing is present. The foramina are patent bilaterally. L4-5: A leftward disc protrusion is present. Asymmetric left-sided facet hypertrophy and spurring is noted. This results an moderate left subarticular and foraminal stenosis. Mild right subarticular and  foraminal narrowing is present. L5-S1: Moderate facet hypertrophy is worse on the right. There is no significant stenosis. IMPRESSION: 1. Multilevel spondylosis of the cervical spine as described. 2. Mild right foraminal narrowing at C2-3 due to asymmetric facet hypertrophy. 3. Advanced right-sided facet hypertrophy at C3-4 and C4-5. 4. Severe right and moderate left foraminal narrowing at C3-4. 5. Mild foraminal narrowing at C4-5 is worse on the right. 6. Severe left and moderate right foraminal narrowing at C5-6. 7. Moderate foraminal narrowing bilaterally at C6-7 is slightly worse on the left. 8. Mild central canal narrowing at multiple levels is most significant at C4-5. 9. Large right paramedian disc protrusion at L2-3 with severe right subarticular stenosis, likely impacting the right L3 nerve root. 10. Mild left subarticular and right foraminal narrowing at L2-3. 11. Mild left subarticular narrowing at L3-4 without significant foraminal stenosis. 12. Moderate left and mild right subarticular and foraminal stenosis at L4-5 secondary to a leftward disc protrusion. This potentially impacts the L4 and L5 nerve roots. 13. Asymmetric right-sided facet hypertrophy at L5-S1 without focal stenosis. Electronically Signed   By: San Morelle M.D.   On: 03/16/2018 14:39    ECG & Cardiac Imaging    Afib, 107, left axis, lbbb, no acute changes  Assessment & Plan    1.  Persistent Atrial  Fibrillation: Patient with a recent finding of persistent atrial fibrillation RVR since mid May despite outpatient attempts to titrate medications and control rate.  She was recently seen in clinic with heart failure symptoms on top of rapid atrial fibrillation was placed on amiodarone with plan for cardioversion this coming Wednesday.  She had worsening dyspnea and heart failure symptoms this morning prompting presentation and admission.  Continue IV diuresis along with amiodarone and beta-blocker therapy.  She has not missed any doses of Xarelto.  Likely plan on cardioversion in the morning.  2.  Acute on chronic diastolic CHF: Still with mild volume excess.  She is responding well to diuresis.  Continue.  Recent echo showed an EF of 45 to 50% in the setting of rapid A. fib.  3.  Essential HTN: Blood pressure elevated this morning.  Follow after morning meds.  Signed, Murray Hodgkins, NP 03/25/2018, 1:09 PM  For questions or updates, please contact   Please consult [email or web address] for contact info under Cardiology/STEMI.

## 2018-03-25 NOTE — ED Provider Notes (Addendum)
[facility] Emergency Department Provider Note   ____________________________________________   First MD Initiated Contact with Patient 03/25/18 [phone]     (approximate)  I have reviewed the triage vital signs and the nursing notes.   HISTORY  Chief Complaint Shortness of Breath and Tachycardia    HPI [name] is a 77 y.o. female who presents to the ED from home with a chief complaint of chest tightness and shortness of breath.  Patient has atrial fibrillation, on Xarelto, scheduled for cardioversion in 2 days.  Awoke with chest tightness and shortness of breath.  Feels a rattling in her chest.  Complains of nonproductive cough.  Denies recent fever, chills, abdominal pain, nausea, vomiting, dysuria, diarrhea.  Denies recent travel or trauma.   Past Medical History:  Diagnosis Date  . Bronchitis   . DJD (degenerative joint disease), cervical   . Hyperlipidemia   . Hypertension   . Hypothyroid   . LBBB (left bundle branch block)   . SVT (supraventricular tachycardia) ([place])    a. s/p ablation by Dr Lovena Le 5/13    Patient Active Problem List   Diagnosis Date Noted  . Paroxysmal sinus tachycardia ([place]) 01/29/2018  . Leg pain 01/04/2018  . PAD (peripheral artery disease) ([place]) 01/04/2018  . Carotid artery calcification, bilateral 12/19/2017  . Cervical radiculitis 12/19/2017  . Atrial fibrillation, new onset ([place]) 12/19/2017  . Hyperglycemia 03/06/2016  . Hypothyroid 04/21/2015  . 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 Anxiety 04/21/2015  . Hyperlipidemia 04/21/2015  . Diuretic-induced hypokalemia 04/21/2015  . Palpitations 12/18/2012  . SVT (supraventricular tachycardia) ([place]) 05/27/2012  . Hypertension 05/27/2012    Past Surgical History:  Procedure Laterality Date  . ELECTROPHYSIOLOGY STUDY N/A 02/27/2012   Procedure: ELECTROPHYSIOLOGY STUDY;  Surgeon: Evans Lance, MD;  Location: [facility] CATH LAB;  Service:  Cardiovascular;  Laterality: N/A;  . EPS and ablation for SVT  5/13   slow pathway ablation by Dr Lovena Le  . EYE SURGERY     surgery for detatched retina  . SUPRAVENTRICULAR TACHYCARDIA ABLATION N/A 02/27/2012   Procedure: SUPRAVENTRICULAR TACHYCARDIA ABLATION;  Surgeon: Evans Lance, MD;  Location: [facility] CATH LAB;  Service: Cardiovascular;  Laterality: N/A;    Prior to Admission medications   Medication Sig Start Date End Date Taking? Authorizing Provider  acetaminophen (TYLENOL) 650 MG CR tablet Take 1,300 mg by mouth every 8 (eight) hours as needed for pain.   Yes [provider]  amiodarone (PACERONE) 200 MG tablet Take 2 tab (400 mg) by mouth two times a day for 1 week, then take 1 tablet (200 mg) by mouth two times a day. 03/22/18  Yes Dunn, Ryan M, PA-C  atorvastatin (LIPITOR) 20 MG tablet TAKE 1 TABLET(20 MG) BY MOUTH DAILY AT 6 PM 01/07/18  Yes Sowles, Drue Stager, MD  busPIRone (BUSPAR) 5 MG tablet Take 1 tablet (5 mg total) by mouth 2 (two) times daily. 02/15/18  Yes Sowles, Drue Stager, MD  cholecalciferol (VITAMIN D) 1000 UNITS tablet Take 2,000 Units by mouth daily.   Yes [provider]  ezetimibe (ZETIA) 10 MG tablet Take 1 tablet (10 mg total) by mouth daily. 01/29/18  Yes Gollan, Kathlene November, MD  levothyroxine (SYNTHROID, LEVOTHROID) 137 MCG tablet TAKE 1 TABLET(137 MCG) BY MOUTH EVERY MORNING 01/07/18  Yes Sowles, Drue Stager, MD  metoprolol succinate (TOPROL-XL) 25 MG 24 hr tablet Take 3 tablets (75 mg total) by mouth 2 (two) times daily. Take with or immediately following  a meal. 03/22/18  Yes Dunn, Areta Haber, PA-C  Multiple Vitamin (MULITIVITAMIN WITH MINERALS) TABS Take 1 tablet by mouth daily.   Yes [provider]  valsartan-hydrochlorothiazide (DIOVAN HCT) 160-12.5 MG tablet Take 1 tablet by mouth daily. In place of losartan because of recall 12/13/17  Yes Sowles, Drue Stager, MD  XARELTO 20 MG TABS tablet TAKE 1 TABLET BY MOUTH DAILY WITH SUPPER 02/14/18  Yes Steele Sizer,  MD    Allergies Codeine; Erythromycin; Septra [sulfamethoxazole-trimethoprim]; and Penicillins  Family History  Problem Relation Age of Onset  . Alzheimer's disease Mother   . Cancer Father   . Stroke Father   . Cancer Paternal Aunt   . Thyroid disease Maternal Grandmother     Social History Social History   Tobacco Use  . Smoking status: Never Smoker  . Smokeless tobacco: Never Used  Substance Use Topics  . Alcohol use: No  . Drug use: No    Review of Systems  Constitutional: No fever/chills Eyes: No visual changes. ENT: No sore throat. Cardiovascular: Positive for chest pain. Respiratory: Positive for shortness of breath. Gastrointestinal: No abdominal pain.  No nausea, no vomiting.  No diarrhea.  No constipation. Genitourinary: Negative for dysuria. Musculoskeletal: Negative for back pain. Skin: Negative for rash. Neurological: Negative for headaches, focal weakness or numbness.   ____________________________________________   PHYSICAL EXAM:  VITAL SIGNS: ED Triage Vitals [03/25/18 0512]  Enc Vitals Group     BP (!) 157/98     Pulse Rate (!) 111     Resp 20     Temp (!) 95.6 F (35.3 C)     Temp Source Oral     SpO2 96 %     Weight      Height      Head Circumference      Peak Flow      Pain Score 6     Pain Loc      Pain Edu?      Excl. in [place]?     Constitutional: Alert and oriented. Well appearing and in no acute distress. Eyes: Conjunctivae are normal. PERRL. EOMI. Head: Atraumatic. Nose: No congestion/rhinnorhea. Mouth/Throat: Mucous membranes are moist.  Oropharynx non-erythematous. Neck: No stridor.   Cardiovascular: Tachycardic rate, irregular rhythm. Grossly normal heart sounds.  Good peripheral circulation. Respiratory: Normal respiratory effort.  No retractions. Lungs with bibasilar rales. Gastrointestinal: Soft and nontender. No distention. No abdominal bruits. No CVA tenderness. Musculoskeletal: No lower extremity tenderness. 1+  BLE nonpitting edema.  No joint effusions. Neurologic:  Normal speech and language. No gross focal neurologic deficits are appreciated. No gait instability. Skin:  Skin is warm, dry and intact. No rash noted. Psychiatric: Mood and affect are normal. Speech and behavior are normal.  ____________________________________________   LABS (all labs ordered are listed, but only abnormal results are displayed)  Labs Reviewed  BASIC METABOLIC PANEL - Abnormal; Notable for the following components:      Result Value   Glucose, Bld 163 (*)    BUN 21 (*)    All other components within normal limits  BRAIN NATRIURETIC PEPTIDE - Abnormal; Notable for the following components:   B Natriuretic Peptide 452.0 (*)    All other components within normal limits  CBC  TROPONIN I   ____________________________________________  EKG  ED ECG REPORT I, Kolbie Clarkston J, the attending physician, personally viewed and interpreted this ECG.   Date: 03/25/2018  EKG Time: 0507  Rate: 107  Rhythm: atrial fibrillation, rate 107  Axis:  LAD  Intervals:left bundle branch block  ST&T Change: Nonspecific No significant change from 03/13/2018 ____________________________________________  RADIOLOGY  ED MD interpretation: Pulmonary vascular congestion, right pleural effusion  Official radiology report(s): Dg Chest 2 View  Result Date: 03/25/2018 CLINICAL DATA:  Shortness of breath. History of atrial fibrillation, bronchitis. EXAM: CHEST - 2 VIEW COMPARISON:  Chest radiograph Feb 23, 2012 FINDINGS: Cardiac silhouette is moderately enlarged. Calcified aortic arch. Pulmonary vascular congestion. Bronchitic changes. Small RIGHT pleural effusion with bibasilar consolidation. No pneumothorax. Soft tissue planes and included osseous structures are nonsuspicious. IMPRESSION: Similar cardiomegaly with pulmonary vascular congestion and bronchitic changes. Bibasilar consolidation with small RIGHT pleural effusion. Aortic  Atherosclerosis (ICD10-I70.0). Electronically Signed   By: Elon Alas M.D.   On: 03/25/2018 05:53    ____________________________________________   PROCEDURES  Procedure(s) performed: None  Procedures  Critical Care performed:   CRITICAL CARE Performed by: Paulette Blanch   Total critical care time: 30 minutes  Critical care time was exclusive of separately billable procedures and treating other patients.  Critical care was necessary to treat or prevent imminent or life-threatening deterioration.  Critical care was time spent personally by me on the following activities: development of treatment plan with patient and/or surrogate as well as nursing, discussions with consultants, evaluation of patient's response to treatment, examination of patient, obtaining history from patient or surrogate, ordering and performing treatments and interventions, ordering and review of laboratory studies, ordering and review of radiographic studies, pulse oximetry and re-evaluation of patient's condition.  ____________________________________________   INITIAL IMPRESSION / ASSESSMENT AND PLAN / ED COURSE  As part of my medical decision making, I reviewed the following data within the [place] History obtained from family, Nursing notes reviewed and incorporated, Labs reviewed, EKG interpreted, Old EKG reviewed, Old chart reviewed, Radiograph reviewed and Notes from prior ED visits   77 year old female with atrial fibrillation who is scheduled for ablation on Wednesday.  Presents with chest tightness and shortness of breath. Differential includes, but is not limited to, viral syndrome, bronchitis including COPD exacerbation, pneumonia, reactive airway disease including asthma, CHF including exacerbation with or without pulmonary/interstitial edema, pneumothorax, ACS, thoracic trauma, and pulmonary embolism.   Laboratory results unremarkable; initial troponin negative.  Will  check BNP.  Clinically and by x-ray patient is fluid overloaded.  Will ambulate patient to assess how she does.  Will initiate IV diuresis.   Clinical Course as of Mar 25 658  Mon Mar 25, 2018  0631 Patient ambulated with pulse oximeter.  She became tachycardic to the 130s, atrial fibrillation.  Room air saturation dropped to 91%.  This was just walking around the room; did not attempt to walk patient into the hallway.  She returns to her bed tachypneic and tachycardic.   [JS]    Clinical Course User Index [JS] Paulette Blanch, MD     ____________________________________________   FINAL CLINICAL IMPRESSION(S) / ED DIAGNOSES  Final diagnoses:  Persistent atrial fibrillation [facility])  Pulmonary vascular congestion  Chest pain, unspecified type     ED Discharge Orders    None       Note:  This document was prepared using Dragon voice recognition software and may include unintentional dictation errors.    Paulette Blanch, MD 03/25/18 2229    Paulette Blanch, MD 03/25/18 [phone]

## 2018-03-25 NOTE — H&P (Signed)
[facility] [place] NAME: [name]    MR#:  [id number]  DATE OF BIRTH:  [birth year]  DATE OF ADMISSION:  03/25/2018  PRIMARY CARE PHYSICIAN: Steele Sizer, MD   REQUESTING/REFERRING PHYSICIAN: Dr. Lurline Hare  CHIEF COMPLAINT: Chest tightness   Chief Complaint  Patient presents with  . Shortness of Breath  . Tachycardia    HISTORY OF PRESENT ILLNESS:  [name]  is a 77 y.o. female with a known history of chronic A. fib, supposed to get cardioverted this Wednesday came in because of chest tightness, shortness of breath patient had chest tightness that woke her up from sleep associated with nausea, dizziness.  Patient is on Xarelto, amiodarone, supposed to be cardioverted this once a day.  But she says she cannot take anymore and having shortness of breath, palpitations.  Chest x-ray showed pulmonary vascular congestion.  First set of troponins are negative.  Ambulation patient heart rate went up to 139 bpm, oxygen saturation decreased to 91% on room air.  She is now on 2 L of oxygen saturation is around 98% at rest and also she is not tachycardic at rest heart rate is staying around 80 to 90 bpm.  Patient appears comfortable at rest.  She also received Nitropaste in the emergency room with she says it is helping her with chest pain. PAST MEDICAL HISTORY:   Past Medical History:  Diagnosis Date  . Bronchitis   . DJD (degenerative joint disease), cervical   . Hyperlipidemia   . Hypertension   . Hypothyroid   . LBBB (left bundle branch block)   . SVT (supraventricular tachycardia) ([facility])    a. s/p ablation by Dr Lovena Le 5/13    PAST SURGICAL HISTOIRY:   Past Surgical History:  Procedure Laterality Date  . ELECTROPHYSIOLOGY STUDY N/A 02/27/2012   Procedure: ELECTROPHYSIOLOGY STUDY;  Surgeon: Evans Lance, MD;  Location: [facility] CATH LAB;  Service: Cardiovascular;  Laterality: N/A;  . EPS and ablation for SVT  5/13   slow pathway  ablation by Dr Lovena Le  . EYE SURGERY     surgery for detatched retina  . SUPRAVENTRICULAR TACHYCARDIA ABLATION N/A 02/27/2012   Procedure: SUPRAVENTRICULAR TACHYCARDIA ABLATION;  Surgeon: Evans Lance, MD;  Location: [facility] CATH LAB;  Service: Cardiovascular;  Laterality: N/A;    SOCIAL HISTORY:   Social History   Tobacco Use  . Smoking status: Never Smoker  . Smokeless tobacco: Never Used  Substance Use Topics  . Alcohol use: No    FAMILY HISTORY:   Family History  Problem Relation Age of Onset  . Alzheimer's disease Mother   . Cancer Father   . Stroke Father   . Cancer Paternal Aunt   . Thyroid disease Maternal Grandmother     DRUG ALLERGIES:   Allergies  Allergen Reactions  . Codeine Nausea And Vomiting  . Erythromycin Itching and Swelling  . Septra [Sulfamethoxazole-Trimethoprim] Swelling  . Penicillins Swelling and Rash    Has patient had a PCN reaction causing immediate rash, facial/tongue/throat swelling, SOB or lightheadedness with hypotension: Yes Has patient had a PCN reaction causing severe rash involving mucus membranes or skin necrosis: No Has patient had a PCN reaction that required hospitalization: No Has patient had a PCN reaction occurring within the last 10 years: No If all of the above answers are "NO", then may proceed with Cephalosporin use.     REVIEW OF SYSTEMS:  CONSTITUTIONAL: No fever, fatigue or weakness.  EYES: No blurred or double vision.  EARS, NOSE, AND THROAT: No tinnitus or ear pain.  RESPIRATORY: No cough, shortness of breath, wheezing or hemoptysis.  CARDIOVASCULAR: Chest pain, shortness of breath   GASTROINTESTINAL: No nausea, vomiting, diarrhea or abdominal pain.  GENITOURINARY: No dysuria, hematuria.  ENDOCRINE: No polyuria, nocturia,  HEMATOLOGY: No anemia, easy bruising or bleeding SKIN: No rash or lesion. MUSCULOSKELETAL: No joint pain or arthritis.   NEUROLOGIC: No tingling, numbness, weakness.  PSYCHIATRY: No anxiety or  depression.   MEDICATIONS AT HOME:   Prior to Admission medications   Medication Sig Start Date End Date Taking? Authorizing Provider  acetaminophen (TYLENOL) 650 MG CR tablet Take 1,300 mg by mouth every 8 (eight) hours as needed for pain.   Yes [provider]  amiodarone (PACERONE) 200 MG tablet Take 2 tab (400 mg) by mouth two times a day for 1 week, then take 1 tablet (200 mg) by mouth two times a day. 03/22/18  Yes Dunn, Ryan M, PA-C  atorvastatin (LIPITOR) 20 MG tablet TAKE 1 TABLET(20 MG) BY MOUTH DAILY AT 6 PM 01/07/18  Yes Sowles, Drue Stager, MD  busPIRone (BUSPAR) 5 MG tablet Take 1 tablet (5 mg total) by mouth 2 (two) times daily. 02/15/18  Yes Sowles, Drue Stager, MD  cholecalciferol (VITAMIN D) 1000 UNITS tablet Take 2,000 Units by mouth daily.   Yes [provider]  ezetimibe (ZETIA) 10 MG tablet Take 1 tablet (10 mg total) by mouth daily. 01/29/18  Yes Gollan, Kathlene November, MD  levothyroxine (SYNTHROID, LEVOTHROID) 137 MCG tablet TAKE 1 TABLET(137 MCG) BY MOUTH EVERY MORNING 01/07/18  Yes Sowles, Drue Stager, MD  metoprolol succinate (TOPROL-XL) 25 MG 24 hr tablet Take 3 tablets (75 mg total) by mouth 2 (two) times daily. Take with or immediately following a meal. 03/22/18  Yes Dunn, Areta Haber, PA-C  Multiple Vitamin (MULITIVITAMIN WITH MINERALS) TABS Take 1 tablet by mouth daily.   Yes [provider]  valsartan-hydrochlorothiazide (DIOVAN HCT) 160-12.5 MG tablet Take 1 tablet by mouth daily. In place of losartan because of recall 12/13/17  Yes Sowles, Drue Stager, MD  XARELTO 20 MG TABS tablet TAKE 1 TABLET BY MOUTH DAILY WITH SUPPER 02/14/18  Yes Sowles, Drue Stager, MD      VITAL SIGNS:  Blood pressure (!) 143/93, pulse 89, temperature (!) 95.6 F (35.3 C), temperature source Oral, resp. rate (!) 21, SpO2 97 %.  PHYSICAL EXAMINATION:  GENERAL:  77 y.o.-year-old patient lying in the bed with no acute distress.  EYES: Pupils equal, round, reactive to light and accommodation. No  scleral icterus. Extraocular muscles intact.  HEENT: Head atraumatic, normocephalic. Oropharynx and nasopharynx clear.  NECK:  Supple, no jugular venous distention. No thyroid enlargement, no tenderness.  LUNGS: Normal breath sounds bilaterally, no wheezing, rales,rhonchi or crepitation. No use of accessory muscles of respiration.  CARDIOVASCULAR: S1, S2 normal. No murmurs, rubs, or gallops.  ABDOMEN: Soft, nontender, nondistended. Bowel sounds present. No organomegaly or mass.  EXTREMITIES: No pedal edema, cyanosis, or clubbing.  NEUROLOGIC: Cranial nerves II through XII are intact. Muscle strength 5/5 in all extremities. Sensation intact. Gait not checked.  PSYCHIATRIC: The patient is alert and oriented x 3.  SKIN: No obvious rash, lesion, or ulcer.   LABORATORY PANEL:   CBC Recent Labs  Lab 03/25/18 0537  WBC 9.6  HGB 12.8  HCT 36.9  PLT 322   ------------------------------------------------------------------------------------------------------------------  Chemistries  Recent Labs  Lab 03/21/18 0816 03/25/18 0537  NA 139 137  K 3.5 3.6  CL 104 106  CO2 26 22  GLUCOSE 145* 163*  BUN 15 21*  CREATININE 0.78 0.79  CALCIUM 9.1 8.9  AST 29  --   ALT 25  --   ALKPHOS 65  --   BILITOT 0.8  --    ------------------------------------------------------------------------------------------------------------------  Cardiac Enzymes Recent Labs  Lab 03/25/18 0537  TROPONINI <0.03   ------------------------------------------------------------------------------------------------------------------  RADIOLOGY:  Dg Chest 2 View  Result Date: 03/25/2018 CLINICAL DATA:  Shortness of breath. History of atrial fibrillation, bronchitis. EXAM: CHEST - 2 VIEW COMPARISON:  Chest radiograph Feb 23, 2012 FINDINGS: Cardiac silhouette is moderately enlarged. Calcified aortic arch. Pulmonary vascular congestion. Bronchitic changes. Small RIGHT pleural effusion with bibasilar consolidation. No  pneumothorax. Soft tissue planes and included osseous structures are nonsuspicious. IMPRESSION: Similar cardiomegaly with pulmonary vascular congestion and bronchitic changes. Bibasilar consolidation with small RIGHT pleural effusion. Aortic Atherosclerosis (ICD10-I70.0). Electronically Signed   By: Elon Alas M.D.   On: 03/25/2018 05:53    EKG:   Orders placed or performed during the hospital encounter of 03/25/18  . EKG 12-Lead  . EKG 12-Lead  . ED EKG within 10 minutes  . 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 Rockey Situ came in because of chest tightness, shortness of breath. 1.  Atrial fibrillation with RVR: Patient heart rate went up to 140 bpm with ambulation with hypoxia.  Admit the patient to telemetry, continue oxygen, patient will get her home dose amiodarone, Toprol-XL 75 mg p.o. twice daily.  Patient started on high-dose amiodarone 400 twice daily for 1 week that he started on May 31.  wondering if she can havecardioversion while she is here.  2.  Acute systolic CHF, pulmonary hypertension: Patient BNP slightly elevated to 452.  Patient referred Diovan instead of Lasix as per cardiology note.  But she was given Lasix in the emergency room.  Appreciate cardiology following on this.  3.  Carotid artery disease: Patient is on Lipitor, Zetia. Case discussed with Dr. Fletcher Anon from [facility] health cardiology. All the records are reviewed and case discussed with ED provider. Management plans discussed with the patient, family and they are in agreement.  CODE STATUS:full code  TOTAL TIME TAKING CARE OF THIS PATIENT: 55 minutes.    Epifanio Lesches M.D on 03/25/2018 at 8:00 AM  Between 7am to 6pm - Pager -  [phone]  After 6pm go to [email or web address] - password [email or web address] [facility]  [place] [facility]  Office  [phone]  CC: Primary care physician; Steele Sizer, MD  Note: This dictation was prepared with Dragon dictation along with smaller phrase technology. Any transcriptional errors that result from this process are unintentional.

## 2018-03-26 ENCOUNTER — Other Ambulatory Visit: Payer: Self-pay | Admitting: Family Medicine

## 2018-03-26 ENCOUNTER — Inpatient Hospital Stay: Payer: Medicare Other | Admitting: Anesthesiology

## 2018-03-26 ENCOUNTER — Encounter
Admission: EM | Disposition: A | Discharge status: Home / Self Care | Payer: Self-pay | Source: Home / Self Care | Attending: Internal Medicine

## 2018-03-26 ENCOUNTER — Other Ambulatory Visit: Payer: Self-pay | Admitting: Cardiovascular Disease

## 2018-03-26 DIAGNOSIS — I4891 Unspecified atrial fibrillation: Secondary | ICD-10-CM

## 2018-03-26 DIAGNOSIS — I5041 Acute combined systolic (congestive) and diastolic (congestive) heart failure: Secondary | ICD-10-CM

## 2018-03-26 HISTORY — PX: CARDIOVERSION: EP1203

## 2018-03-26 LAB — GLUCOSE, CAPILLARY: Glucose-Capillary: 130 mg/dL — ABNORMAL HIGH (ref 65–99)

## 2018-03-26 LAB — CBC
HEMATOCRIT: 35.9 % (ref 35.0–47.0)
Hemoglobin: 12.2 g/dL (ref 12.0–16.0)
MCH: 31.4 pg (ref 26.0–34.0)
MCHC: 34.1 g/dL (ref 32.0–36.0)
MCV: 92.1 fL (ref 80.0–100.0)
Platelets: 301 10*3/uL (ref 150–440)
RBC: 3.89 MIL/uL (ref 3.80–5.20)
RDW: 14.1 % (ref 11.5–14.5)
WBC: 10 10*3/uL (ref 3.6–11.0)

## 2018-03-26 LAB — BASIC METABOLIC PANEL
Anion gap: 11 (ref 5–15)
BUN: 21 mg/dL — AB (ref 6–20)
CO2: 24 mmol/L (ref 22–32)
Calcium: 8.8 mg/dL — ABNORMAL LOW (ref 8.9–10.3)
Chloride: 103 mmol/L (ref 101–111)
Creatinine, Ser: 0.78 mg/dL (ref 0.44–1.00)
GFR calc Af Amer: >60 mL/min (ref >60)
GLUCOSE: 135 mg/dL — AB (ref 65–99)
POTASSIUM: 3.4 mmol/L — AB (ref 3.5–5.1)
Sodium: 138 mmol/L (ref 135–145)

## 2018-03-26 SURGERY — CARDIOVERSION (CATH LAB)
Anesthesia: Choice

## 2018-03-26 MED ORDER — PROPOFOL 10 MG/ML IV BOLUS
INTRAVENOUS | Status: DC | PRN
  Administered 2018-03-26: 20 mg via INTRAVENOUS
  Administered 2018-03-26: 40 mg via INTRAVENOUS

## 2018-03-26 MED ORDER — ONDANSETRON HCL 4 MG/2ML IJ SOLN: 4.0000 mg | Freq: Once | INTRAMUSCULAR | Status: DC | PRN

## 2018-03-26 MED ORDER — POTASSIUM CHLORIDE CRYS ER 20 MEQ PO TBCR
40.0000 meq | EXTENDED_RELEASE_TABLET | Freq: Once | ORAL | Status: AC
  Administered 2018-03-26: 40 meq via ORAL
  Filled 2018-03-26: qty 2

## 2018-03-26 MED ORDER — FENTANYL CITRATE (PF) 100 MCG/2ML IJ SOLN: 25.0000 ug | INTRAMUSCULAR | Status: DC | PRN

## 2018-03-26 MED ORDER — FUROSEMIDE 20 MG PO TABS
20.0000 mg | ORAL_TABLET | Freq: Every day | ORAL | Status: DC
  Administered 2018-03-26 – 2018-03-27 (×2): 20 mg via ORAL
  Filled 2018-03-26 (×2): qty 1

## 2018-03-26 NOTE — Progress Notes (Signed)
Dr End notified pt HR irregular, with unsustained episodes of bradycardia in the 40's. Orders to hold metoprolol, and continue to monitor patient at this time.

## 2018-03-26 NOTE — Plan of Care (Signed)
  Problem: Clinical Measurements: Goal: Cardiovascular complication will be avoided Outcome: Progressing   Problem: Activity: Goal: Risk for activity intolerance will decrease Outcome: Progressing   Problem: Pain Managment: Goal: General experience of comfort will improve Outcome: Progressing   Problem: Safety: Goal: Ability to remain free from injury will improve Outcome: Progressing   

## 2018-03-26 NOTE — Plan of Care (Signed)
°  Problem: Clinical Measurements: °Goal: Ability to maintain clinical measurements within normal limits will improve °Outcome: Progressing °Goal: Diagnostic test results will improve °Outcome: Progressing °Goal: Cardiovascular complication will be avoided °Outcome: Progressing °  °

## 2018-03-26 NOTE — Progress Notes (Signed)
[facility] NAME: [name]    MR#:  [id number]  DATE OF BIRTH:  1941-01-03  SUBJECTIVE: Admitted yesterday by me for atrial fibrillation with RVR, acute systolic heart failure.  Patient is cardioverted this morning by [facility] health cardiologist Dr. Saunders Revel, now in sinus rhythm.  She says that she is feeling much better than yesterday.  CHIEF COMPLAINT:   Chief Complaint  Patient presents with  . Shortness of Breath  . Tachycardia    REVIEW OF SYSTEMS:    ROS  Nutrition:  Tolerating Diet: Tolerating PT:      DRUG ALLERGIES:   Allergies  Allergen Reactions  . Codeine Nausea And Vomiting  . Erythromycin Itching and Swelling  . Septra [Sulfamethoxazole-Trimethoprim] Swelling  . Penicillins Swelling and Rash    Has patient had a PCN reaction causing immediate rash, facial/tongue/throat swelling, SOB or lightheadedness with hypotension: Yes Has patient had a PCN reaction causing severe rash involving mucus membranes or skin necrosis: No Has patient had a PCN reaction that required hospitalization: No Has patient had a PCN reaction occurring within the last 10 years: No If all of the above answers are "NO", then may proceed with Cephalosporin use.     VITALS:  Blood pressure (!) 130/54, pulse 64, temperature 97.8 F (36.6 C), temperature source Oral, resp. rate (!) 22, weight 68.5 kg (151 lb), SpO2 98 %.  PHYSICAL EXAMINATION:   Physical Exam  GENERAL:  77 y.o.-year-old patient lying in the bed with no acute distress.  EYES: Pupils equal, round, reactive to light and accommodation. No scleral icterus. Extraocular muscles intact.  HEENT: Head atraumatic, normocephalic. Oropharynx and nasopharynx clear.  NECK:  Supple, no jugular venous distention. No thyroid enlargement, no tenderness.  LUNGS: Normal breath sounds bilaterally, no wheezing, rales,rhonchi or crepitation. No use of accessory muscles of respiration.   CARDIOVASCULAR: S1, S2 normal. No murmurs, rubs, or gallops.  ABDOMEN: Soft, nontender, nondistended. Bowel sounds present. No organomegaly or mass.  EXTREMITIES: No pedal edema, cyanosis, or clubbing.  NEUROLOGIC: Cranial nerves II through XII are intact. Muscle strength 5/5 in all extremities. Sensation intact. Gait not checked.  PSYCHIATRIC: The patient is alert and oriented x 3.  SKIN: No obvious rash, lesion, or ulcer.    LABORATORY PANEL:   CBC Recent Labs  Lab 03/26/18 0346  WBC 10.0  HGB 12.2  HCT 35.9  PLT 301   ------------------------------------------------------------------------------------------------------------------  Chemistries  Recent Labs  Lab 03/21/18 0816  03/26/18 0346  NA 139   < > 138  K 3.5   < > 3.4*  CL 104   < > 103  CO2 26   < > 24  GLUCOSE 145*   < > 135*  BUN 15   < > 21*  CREATININE 0.78   < > 0.78  CALCIUM 9.1   < > 8.8*  AST 29  --   --   ALT 25  --   --   ALKPHOS 65  --   --   BILITOT 0.8  --   --    < > = values in this interval not displayed.   ------------------------------------------------------------------------------------------------------------------  Cardiac Enzymes Recent Labs  Lab 03/25/18 1951  TROPONINI <0.03   ------------------------------------------------------------------------------------------------------------------  RADIOLOGY:  Dg Chest 2 View  Result Date: 03/25/2018 CLINICAL DATA:  Shortness of breath. History of atrial fibrillation, bronchitis. EXAM: CHEST - 2 VIEW COMPARISON:  Chest radiograph Feb 23, 2012 FINDINGS: Cardiac silhouette is  moderately enlarged. Calcified aortic arch. Pulmonary vascular congestion. Bronchitic changes. Small RIGHT pleural effusion with bibasilar consolidation. No pneumothorax. Soft tissue planes and included osseous structures are nonsuspicious. IMPRESSION: Similar cardiomegaly with pulmonary vascular congestion and bronchitic changes. Bibasilar consolidation with small  RIGHT pleural effusion. Aortic Atherosclerosis (ICD10-I70.0). Electronically Signed   By: Elon Alas M.D.   On: 03/25/2018 05:53     ASSESSMENT AND PLAN:   Active Problems:   A-fib ([place])   #1. persistent atrial fibrillation: Patient started on amiodarone 400 mg twice daily on May 30 for 1 week, metoprolol, Xarelto, plan to do cardioversion as an outpatient tomorrow but patient got admitted because of admission with RVR with heart rate up to 140 bpm with exertion and also patient was feeling short of breath.  Cardioversion is expectorated, patient had cardioversion today morning, now she is in sinus rhythm.  She says that she is feeling much better, denies any chest pain or shortness of breath.  Continue to monitor on telemetry, post cardioversion sinus bradycardia with 57 bpm, discontinued #2 beta-blocker, monitor overnight for watching heart rate closely, likely discharge home tomorrow and follow-up with Dr. Rockey Situ as an outpatient.  #2.  acute on chronic systolic heart failure, Lasix started on admission, patient says that she feels much better today less short of breath, continue Lasix 20 mg p.o. daily, ARB, patient does have hypokalemia, potassium being replaced, potassium more than 4.  3.  Essential hypertension: Controlled.   All the records are reviewed and case discussed with Care Management/Social Workerr. Management plans discussed with the patient, family and they are in agreement.  CODE STATUS: Full  code  TOTAL TIME TAKING CARE OF THIS PATIENT:35  minutes.   POSSIBLE D/C IN 1 DAY, DEPENDING ON CLINICAL CONDITION.   Epifanio Lesches M.D on 03/26/2018 at 4:58 PM  Between 7am to 6pm - Pager - [phone]  After 6pm go to [email or web address] - password [email or web address] [facility]  [facility]  Office  [phone]  CC: Primary care physician; Steele Sizer, MD

## 2018-03-26 NOTE — Transfer of Care (Signed)
Immediate Anesthesia Transfer of Care Note  Patient: [name]  Procedure(s) Performed: CARDIOVERSION (N/A )  Patient Location:  [facility] 7  Anesthesia Type:General  Level of Consciousness: sedated  Airway & Oxygen Therapy: Patient Spontanous Breathing and Patient connected to nasal cannula oxygen  Post-op Assessment: Report given to RN and Post -op Vital signs reviewed and stable  Post vital signs: Reviewed and stable  Last Vitals:  Vitals Value Taken Time  BP    Temp    Pulse 92 03/26/2018  9:32 AM  Resp 23 03/26/2018  9:32 AM  SpO2 99 % 03/26/2018  9:32 AM  Vitals shown include unvalidated device data.  Last Pain:  Vitals:   03/26/18 0915  TempSrc: Oral  PainSc: 0-No pain         Complications: No apparent anesthesia complications

## 2018-03-26 NOTE — Telephone Encounter (Signed)
She had recent hospital visit and cardioversion. Please contact Dr. Fletcher Anon. I have not seen her since procedure.

## 2018-03-26 NOTE — CV Procedure (Signed)
    Cardioversion Note  [name] [id number] [birth year]  Procedure: DC Cardioversion Indications: Persistent atrial fibrillation  Procedure Details Consent: Obtained Time Out: Verified patient identification, verified procedure, site/side was marked, verified correct patient position, special equipment/implants available, Radiology Safety Procedures followed,  medications/allergies/relevent history reviewed, required imaging and test results available.  Performed  The patient has been on adequate anticoagulation.  The patient received IV propofol for sedation by anesthesia services.  Synchronous cardioversion was performed at 120 joules.  The cardioversion was successful with restoration of sinus rhythm.  Complications: No apparent complications Patient did tolerate procedure well.  Nelva Bush., MD 03/26/2018, 9:55 AM

## 2018-03-26 NOTE — Anesthesia Procedure Notes (Signed)
Date/Time: 03/26/2018 9:30 AM Performed by: Allean Found, CRNA Pre-anesthesia Checklist: Patient identified, Emergency Drugs available, Suction available, Patient being monitored and Timeout performed Patient Re-evaluated:Patient Re-evaluated prior to induction Oxygen Delivery Method: Nasal cannula Induction Type: IV induction Placement Confirmation: positive ETCO2 Dental Injury: Teeth and Oropharynx as per pre-operative assessment

## 2018-03-26 NOTE — Progress Notes (Signed)
Progress Note  Patient Name: [name] Date of Encounter: 03/26/2018  Primary Cardiologist: Ida Rogue, MD  Subjective   Breathing much improved.  For dccv this AM.  Questions answered.  Inpatient Medications    Scheduled Meds: . amiodarone  400 mg Oral BID  . atorvastatin  10 mg Oral q1800  . busPIRone  5 mg Oral BID  . cholecalciferol  2,000 Units Oral Daily  . docusate sodium  100 mg Oral BID  . ezetimibe  10 mg Oral Daily  . furosemide  20 mg Intravenous BID  . irbesartan  150 mg Oral Daily  . levothyroxine  137 mcg Oral QAC breakfast  . metoprolol succinate  75 mg Oral BID  . multivitamin with minerals  1 tablet Oral Daily  . rivaroxaban  20 mg Oral Daily  . sodium chloride flush  3 mL Intravenous Q12H   Continuous Infusions: . sodium chloride     PRN Meds: acetaminophen **OR** acetaminophen, benzonatate, bisacodyl, ondansetron **OR** ondansetron (ZOFRAN) IV, sodium chloride flush, traZODone   Vital Signs    Vitals:   03/25/18 1534 03/25/18 1932 03/26/18 0347 03/26/18 0740  BP: 126/77 133/78 132/79 (!) 149/76  Pulse: (!) 101 (!) 119 65 75  Resp:  18 18 16   Temp: (!) 97.5 F (36.4 C) 97.6 F (36.4 C) 98.3 F (36.8 C) (!) 97.3 F (36.3 C)  TempSrc: Oral Oral  Oral  SpO2: 99% 96% 96% 94%  Weight:   151 lb (68.5 kg)     Intake/Output Summary (Last 24 hours) at 03/26/2018 0837 Last data filed at 03/26/2018 0300 Gross per 24 hour  Intake 240 ml  Output 2000 ml  Net -1760 ml   Filed Weights   03/25/18 0828 03/26/18 0347  Weight: 153 lb (69.4 kg) 151 lb (68.5 kg)    Physical Exam   GEN: Well nourished, well developed, in no acute distress.  HEENT: Grossly normal.  Neck: Supple, no JVD, carotid bruits, or masses. Cardiac: IR, IR, 2/6 SEM RUSB, no rubs, or gallops. No clubbing, cyanosis, edema.  Radials/DP/PT 2+ and equal bilaterally.  Respiratory:  Respirations regular and unlabored, bibasilar crackles. GI: Soft, nontender, nondistended, BS + x  4. MS: no deformity or atrophy. Skin: warm and dry, no rash. Neuro:  Strength and sensation are intact. Psych: AAOx3.  Normal affect.  Labs    Chemistry Recent Labs  Lab 03/21/18 0816 03/25/18 0537 03/26/18 0346  NA 139 137 138  K 3.5 3.6 3.4*  CL 104 106 103  CO2 26 22 24   GLUCOSE 145* 163* 135*  BUN 15 21* 21*  CREATININE 0.78 0.79 0.78  CALCIUM 9.1 8.9 8.8*  PROT 7.1  --   --   ALBUMIN 4.3  --   --   AST 29  --   --   ALT 25  --   --   ALKPHOS 65  --   --   BILITOT 0.8  --   --   GFRNONAA >60 >60 >60  GFRAA >60 >60 >60  ANIONGAP 9 9 11      Hematology Recent Labs  Lab 03/21/18 0816 03/25/18 0537 03/26/18 0346  WBC 6.5 9.6 10.0  RBC 3.81 4.02 3.89  HGB 12.3 12.8 12.2  HCT 35.4 36.9 35.9  MCV 93.0 91.8 92.1  MCH 32.2 31.9 31.4  MCHC 34.6 34.7 34.1  RDW 14.1 14.0 14.1  PLT 299 322 301    Cardiac Enzymes Recent Labs  Lab 03/25/18 0537 03/25/18 0829  03/25/18 1258 03/25/18 1951  TROPONINI <0.03 <0.03 <0.03 <0.03      BNP Recent Labs  Lab 03/21/18 0816 03/25/18 0554  BNP 336.0* 452.0*     Radiology    Dg Chest 2 View  Result Date: 03/25/2018 CLINICAL DATA:  Shortness of breath. History of atrial fibrillation, bronchitis. EXAM: CHEST - 2 VIEW COMPARISON:  Chest radiograph Feb 23, 2012 FINDINGS: Cardiac silhouette is moderately enlarged. Calcified aortic arch. Pulmonary vascular congestion. Bronchitic changes. Small RIGHT pleural effusion with bibasilar consolidation. No pneumothorax. Soft tissue planes and included osseous structures are nonsuspicious. IMPRESSION: Similar cardiomegaly with pulmonary vascular congestion and bronchitic changes. Bibasilar consolidation with small RIGHT pleural effusion. Aortic Atherosclerosis (ICD10-I70.0). Electronically Signed   By: Elon Alas M.D.   On: 03/25/2018 05:53    Telemetry    Afib, 60's to 90's, pvc's - Personally Reviewed  Cardiac Studies   2D Echocardiogram 5.30.2019  Study  Conclusions  - Left ventricle: The cavity size was normal. Wall thickness was   increased in a pattern of mild LVH. Systolic function was mildly   reduced. The estimated ejection fraction was in the range of 45%   to 50%. Diffuse hypokinesis. - Aortic valve: There was trivial regurgitation. - Mitral valve: Mildly calcified annulus. There was moderate   regurgitation directed posteriorly. - Left atrium: The atrium was mildly dilated. - Right ventricle: The cavity size was normal. Systolic function   was normal. - Right atrium: The atrium was mildly dilated. - Tricuspid valve: There was moderate regurgitation. - Pulmonary arteries: Systolic pressure was mildly to moderately   increased, in the range of 40 mm Hg to 45 mm Hg. - Inferior vena cava: The vessel was normal in size. The   respirophasic diameter changes were blunted (< 50%), consistent   with elevated central venous pressure. - Pericardium, extracardiac: A trivial pericardial effusion was   identified.  Patient Profile      77 y.o. female with a history of AVNRT s/p RFCA, PAF (Xarelto), LBBB, HTN, HL, DJD, leg pain w/ nl ABI's (01/2018), HFpEF, and mild carotid dzs, admitted 6/2 with progressive dyspnea and chf in the setting of afib.  Assessment & Plan    1.  Paroxysmal for fibrillation: Patient with recent finding of persistent fibrillation RVR since mid May, despite outpatient times of-medications and control rate.  She was initially set up for outpatient cardioversion later this week but presented over the weekend with progressive dyspnea and volume overload.  She has responded well to diuresis and is -1.7 L for admission.  Weight down to 151 pounds.  Appears euvolemic on exam this morning though bibasilar crackles remain.  Chest x-ray yesterday showed bibasilar consolidation with small right effusion.  Continue amiodarone.  Hold beta-blocker this morning as she is scheduled for cardioversion.  Continue Xarelto.  2.  Acute on  chronic combined systolic and diastolic congestive heart failure: Recent echo showed an EF of 45 to 50% in the setting of A. fib.  As above, volume much improved.  -1.7 L with weight down to 151.  We will switch over from IV Lasix to oral Lasix today.  She was only on HCTZ at home.  Plan to continue beta-blocker if not bradycardic post cardioversion.  Continue ARB.  3.  Essential hypertension: Higher this morning but stable yesterday and overnight.  Signed, Murray Hodgkins, NP  03/26/2018, 8:37 AM    For questions or updates, please contact   Please consult [email or web address] for contact info under Cardiology/STEMI.

## 2018-03-26 NOTE — Progress Notes (Signed)
Notified Dr. Marcille Blanco of K level of 3.4. No new orders.

## 2018-03-26 NOTE — Progress Notes (Signed)
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  CXR revealed pulmonary vascular congestion. BNP on 03/21/2018 336 and 452 on 03/25/2018.  Patient is a NEVER smoker.    Active Problem List this admission: 1. Atrial Fib with RVR 2. Acute on chronic combined systolic and diastolic CHF.  Echo performed on 03/21/2018 revealed EF 45-50% in the setting of a-fib.   3. Carotid Artery Disease   Patient underwent cardioversion this a.m. and was successful converted to NSR.    CHF Education:  Patient sitting up in bed with TV on and talking on the telephone.  Patient informed this RN that she feels much better after having had the cardioversion.   Educational session with patient completed.   Provided patient with "Living Better with Heart Failure" packet. Briefly reviewed definition of heart failure and signs and symptoms of an exacerbation.    *Reviewed importance of and reason behind checking weight daily in the AM, after using the bathroom, but before getting dressed. Patient has scales.  Patient stated she had noticed an increase in her weight the last couple of weeks, but had attributed the weight gain to eating too much and lack of exercise, as she did not feel like walking (exercising) the past few weeks.    Reviewed the following information with patient:  *Discussed when to call the Dr= weight gain of >2-3lb overnight of 5lb in a week,  *Discussed yellow zone= call MD: weight gain of >2-3lb overnight of 5lb in a week, increased swelling, increased SOB when lying down, chest discomfort, dizziness, increased fatigue. ?Patient was able to repeat this back to me.?? *Red Zone= call 911: struggle to breath, fainting or near fainting,  significant chest pain. ?Patient stated this is how she felt when she came to the ER.  Patient stated she presented to the ER with worsening SOB and swelling in her feet and ankles.   *Reviewed low sodium diet-provided handout of recommended and not recommended foods. Reviewed reading labels with patient. Discussed fluid intake with patient as well.   Patient stated this is really a problem for her.  She reports she has always loved salt.  Patient stated, "I love potato chips."  Long discussion with patient about sodium restriction of less than 2000 mg of sodium per day.    *Patient not currently on a fluid restriction, but advised no more than 8-8 ounces glass of fluids (all liquids) per day.    *Instructed patient to take medications as prescribed for heart failure. Explained briefly why pt is on the medications (either make you feel better, live longer or keep you out of the hospital) and discussed monitoring and side effects.   *Discussed exercise. Patient likes to walk, but has been unable to do so the last couple of weeks.  Encouraged patient to remain as active as possible.    *Smoking Cessation?- patient is a NEVER smoker.    ? *[facility] - Role of the [facility] explained to patient. Patient does not want to have an appointment with the [facility] at this time.   Patient will discuss with Dr. Rockey Situ when she sees him next week.   *Follow-up:  Patient has an appointment scheduled with  Dr. Rockey Situ on 04/04/2018 at 0900.     *[place] - Provided patient with information on [employer] of [place].  Invited / Encourage patient to attend.   Patient thanked me for my time in reviewing this information with her.    Roanna Epley, RN, BSN, [facility]? [facility]  Cardiovascular &?Pulmonary Nurse Navigator  Direct Line: [phone]  Department Phone #: [phone] Fax:  [phone]? Email Address: [email or web address]

## 2018-03-26 NOTE — Anesthesia Post-op Follow-up Note (Signed)
Anesthesia QCDR form completed.        

## 2018-03-26 NOTE — Anesthesia Preprocedure Evaluation (Signed)
Anesthesia Evaluation  Patient identified by MRN, date of birth, ID band Patient awake    Reviewed: Allergy & Precautions, H&P , NPO status , Patient's Chart, lab work & pertinent test results, reviewed documented beta blocker date and time   Airway Mallampati: II   Neck ROM: full    Dental  (+) Poor Dentition   Pulmonary neg pulmonary ROS,    Pulmonary exam normal        Cardiovascular hypertension, + Peripheral Vascular Disease and +CHF  negative cardio ROS Normal cardiovascular exam+ dysrhythmias Atrial Fibrillation  Rhythm:regular Rate:Normal     Neuro/Psych Anxiety  Neuromuscular disease negative neurological ROS  negative psych ROS   GI/Hepatic negative GI ROS, Neg liver ROS,   Endo/Other  negative endocrine ROSHypothyroidism   Renal/GU negative Renal ROS  negative genitourinary   Musculoskeletal   Abdominal   Peds  Hematology negative hematology ROS (+)   Anesthesia Other Findings Past Medical History: No date: Bronchitis No date: Chronic combined systolic (congestive) and diastolic  (congestive) heart failure (HCC)     Comment:  a. 02/2018 Echo: EF 45-50%, diff HK, triv AI, mod MR,               mildly dil LA/RA, nl RV fxn, mod TR. PASP 40-31mmHg. No date: DJD (degenerative joint disease), cervical No date: History of stress test     Comment:  a. 01/2008 MV: EF 76%. Fair ex tol. No ischemia. No date: Hyperlipidemia No date: Hypertension No date: Hypothyroid No date: LBBB (left bundle branch block) No date: Leg pain     Comment:  a. 01/2018 ABI's wnl. No date: Persistent atrial fibrillation ([place])     Comment:  a. 01/2018 Event monitor: 45 runs of SVT, longes 14.5               sec. SVT felt to be Afib/flutter-->2% burden. Longest run              of AF 4h 50m (CHA2DS2VASc = 4-->Xarelto). No date: SVT (supraventricular tachycardia) (HCC)     Comment:  a. AVNRT - s/p ablation by Dr Lovena Le 5/13 Past  Surgical History: 02/27/2012: ELECTROPHYSIOLOGY STUDY; N/A     Comment:  Procedure: ELECTROPHYSIOLOGY STUDY;  Surgeon: Evans Lance, MD;  Location[facility];  Service:               Cardiovascular;  Laterality: N/A; 5/13: EPS and ablation for SVT     Comment:  slow pathway ablation by Dr Lovena Le No date: EYE SURGERY     Comment:  surgery for detatched retina 02/27/2012: [place]; N/A     Comment:  Procedure: [place];                Surgeon: Evans Lance, MD;  Location: [facility] CATH LAB;                Service: Cardiovascular;  Laterality: N/A; BMI    Body Mass Index:  24.37 kg/m     Reproductive/Obstetrics negative OB ROS                             Anesthesia Physical Anesthesia Plan  ASA: IV  Anesthesia Plan: General   Post-op Pain Management:    Induction:   PONV Risk Score and Plan:   Airway Management Planned:   Additional  Equipment:   Intra-op Plan:   Post-operative Plan:   Informed Consent: I have reviewed the patients History and Physical, chart, labs and discussed the procedure including the risks, benefits and alternatives for the proposed anesthesia with the patient or authorized representative who has indicated his/her understanding and acceptance.   Dental Advisory Given  Plan Discussed with: CRNA  Anesthesia Plan Comments:         Anesthesia Quick Evaluation

## 2018-03-27 ENCOUNTER — Encounter: Admission: RE | Payer: Self-pay | Source: Ambulatory Visit

## 2018-03-27 ENCOUNTER — Other Ambulatory Visit: Payer: Self-pay | Admitting: Cardiovascular Disease

## 2018-03-27 ENCOUNTER — Other Ambulatory Visit: Payer: Self-pay

## 2018-03-27 ENCOUNTER — Ambulatory Visit: Admission: RE | Admit: 2018-03-27 | Payer: Medicare Other | Source: Ambulatory Visit | Admitting: Cardiovascular Disease

## 2018-03-27 DIAGNOSIS — I4891 Unspecified atrial fibrillation: Secondary | ICD-10-CM

## 2018-03-27 LAB — GLUCOSE, CAPILLARY: Glucose-Capillary: 127 mg/dL — ABNORMAL HIGH (ref 65–99)

## 2018-03-27 SURGERY — CARDIOVERSION (CATH LAB)
Anesthesia: General

## 2018-03-27 MED ORDER — POTASSIUM CHLORIDE ER 10 MEQ PO TBCR: 10.0000 meq | EXTENDED_RELEASE_TABLET | Freq: Every day | ORAL | 1 refills | Status: DC

## 2018-03-27 MED ORDER — AMIODARONE HCL 200 MG PO TABS: ORAL_TABLET | ORAL | 1 refills | Status: DC

## 2018-03-27 MED ORDER — FUROSEMIDE 20 MG PO TABS: 20.0000 mg | ORAL_TABLET | Freq: Every day | ORAL | 1 refills | Status: DC

## 2018-03-27 MED ORDER — VALSARTAN 160 MG PO TABS: 160.0000 mg | ORAL_TABLET | Freq: Every day | ORAL | 1 refills | Status: DC

## 2018-03-27 NOTE — Progress Notes (Signed)
Pt discharged home today with daughter, she has expressed understanding of her medication regimen, and follow up appointments. Her VSS are stable at this time, and she voices no complaints.

## 2018-03-27 NOTE — Addendum Note (Signed)
Addended by: Alvis Lemmings C on: 03/27/2018 09:16 AM   Modules accepted: Orders

## 2018-03-27 NOTE — Telephone Encounter (Signed)
Please review for refill. Thanks!  

## 2018-03-27 NOTE — Discharge Summary (Signed)
[facility] NAME: [name]    MR#:  [id number]  DATE OF BIRTH:  1941-08-09  DATE OF ADMISSION:  03/25/2018 ADMITTING PHYSICIAN: Epifanio Lesches, MD  DATE OF DISCHARGE: 03/27/2018  1:16 PM  PRIMARY CARE PHYSICIAN: Steele Sizer, MD    ADMISSION DIAGNOSIS:  Persistent atrial fibrillation ([place]) [I48.1] Pulmonary vascular congestion [R09.89] Chest pain, unspecified type [R07.9]  DISCHARGE DIAGNOSIS:  Active Problems:   A-fib ([place])   SECONDARY DIAGNOSIS:   Past Medical History:  Diagnosis Date  . Bronchitis   . Chronic combined systolic (congestive) and diastolic (congestive) heart failure ([place])    a. 02/2018 Echo: EF 45-50%, diff HK, triv AI, mod MR, mildly dil LA/RA, nl RV fxn, mod TR. PASP 40-41mmHg.  Marland Kitchen DJD (degenerative joint disease), cervical   . History of stress test    a. 01/2008 MV: EF 76%. Fair ex tol. No ischemia.  . Hyperlipidemia   . Hypertension   . Hypothyroid   . LBBB (left bundle branch block)   . Leg pain    a. 01/2018 ABI's wnl.  . Persistent atrial fibrillation ([place])    a. 01/2018 Event monitor: 45 runs of SVT, longes 14.5 sec. SVT felt to be Afib/flutter-->2% burden. Longest run of AF 4h 39m (CHA2DS2VASc = 4-->Xarelto).  . 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  1.  Atrial fibrillation with rapid ventricular response- patient was admitted to the hospital and placed on a oral amiodarone bolus, maintained on her metoprolol.  Despite being on those medications patient's heart rates continue to be elevated and therefore cardiology consult was obtained.  Patient underwent electrical  cardioversion and currently is in a normal sinus rhythm. - Patient is somewhat bradycardic and therefore patient has been taken off the metoprolol for now.  Patient will continue small dose of amiodarone which is to be tapered over the next few days.  Patient is clinically stable and therefore being discharged home. -Patient will continue her Xarelto  2.  CHF-acute on chronic combined systolic diastolic CHF. - Patient was diuresed with IV Lasix and now being discharged on small dose of oral Lasix.   -Patient's metoprolol was discontinued due to some bradycardia.  Patient will continue Diovan.  3.  Hypothyroidism-patient will resume her Synthroid.  4.  Hyperlipidemia-patient will continue her atorvastatin, Zetia.  5. Essential HTN - pt. Will cont. Her Diovan. Pt. Has been taken off HCTZ as she was started on some low dose lasix while in the hospital.   DISCHARGE CONDITIONS:   Stable.   CONSULTS OBTAINED:    DRUG ALLERGIES:   Allergies  Allergen Reactions  . Codeine Nausea And Vomiting  . Erythromycin Itching and Swelling  . Septra [Sulfamethoxazole-Trimethoprim] Swelling  . Penicillins Swelling and Rash    Has patient had a PCN reaction causing immediate rash, facial/tongue/throat swelling, SOB or lightheadedness with hypotension: Yes Has patient had a PCN reaction causing severe rash involving mucus membranes or skin necrosis: No Has patient had a PCN reaction that required hospitalization: No Has patient had a PCN reaction occurring within the last 10 years: No If all of the above answers are "NO", then may proceed with Cephalosporin use.  DISCHARGE MEDICATIONS:   Allergies as of 03/27/2018      Reactions   Codeine Nausea And Vomiting   Erythromycin Itching, Swelling   Septra [sulfamethoxazole-trimethoprim] Swelling   Penicillins Swelling, Rash   Has patient had a PCN reaction causing immediate rash, facial/tongue/throat swelling, SOB or lightheadedness with  hypotension: Yes Has patient had a PCN reaction causing severe rash involving mucus membranes or skin necrosis: No Has patient had a PCN reaction that required hospitalization: No Has patient had a PCN reaction occurring within the last 10 years: No If all of the above answers are "NO", then may proceed with Cephalosporin use.      Medication List    STOP taking these medications   metoprolol succinate 25 MG 24 hr tablet Commonly known as:  TOPROL XL   valsartan-hydrochlorothiazide 160-12.5 MG tablet Commonly known as:  DIOVAN HCT     TAKE these medications   acetaminophen 650 MG CR tablet Commonly known as:  TYLENOL Take 1,300 mg by mouth every 8 (eight) hours as needed for pain.   amiodarone 200 MG tablet Commonly known as:  PACERONE Take 400 mg PO BID X 4 days, then take 200 mg PO BID What changed:  additional instructions   atorvastatin 20 MG tablet Commonly known as:  LIPITOR TAKE 1 TABLET(20 MG) BY MOUTH DAILY AT 6 PM   busPIRone 5 MG tablet Commonly known as:  BUSPAR Take 1 tablet (5 mg total) by mouth 2 (two) times daily.   cholecalciferol 1000 units tablet Commonly known as:  VITAMIN D Take 2,000 Units by mouth daily.   ezetimibe 10 MG tablet Commonly known as:  ZETIA Take 1 tablet (10 mg total) by mouth daily.   furosemide 20 MG tablet Commonly known as:  LASIX Take 1 tablet (20 mg total) by mouth daily. Start taking on:  03/28/2018   levothyroxine 137 MCG tablet Commonly known as:  SYNTHROID, LEVOTHROID TAKE 1 TABLET(137 MCG) BY MOUTH EVERY MORNING   multivitamin with minerals Tabs tablet Take 1 tablet by mouth daily.   potassium chloride 10 MEQ tablet Commonly known as:  K-DUR Take 1 tablet (10 mEq total) by mouth daily.   valsartan 160 MG tablet Commonly known as:  DIOVAN Take 1 tablet (160 mg total) by mouth daily.   XARELTO 20 MG Tabs tablet Generic drug:  rivaroxaban TAKE 1 TABLET BY MOUTH DAILY WITH SUPPER         DISCHARGE  INSTRUCTIONS:   DIET:  Cardiac diet  DISCHARGE CONDITION:  Stable  ACTIVITY:  Activity as tolerated  OXYGEN:  Home Oxygen: No.   Oxygen Delivery: room air  DISCHARGE LOCATION:  home   If you experience worsening of your admission symptoms, develop shortness of breath, life threatening emergency, suicidal or homicidal thoughts you must seek medical attention immediately by calling 911 or calling your MD immediately  if symptoms less severe.  You Must read complete instructions/literature along with all the possible adverse reactions/side effects for all the Medicines you take and that have been prescribed to you. Take any new Medicines after you have completely understood and accpet all the possible adverse reactions/side effects.   Please note  You were cared for by a hospitalist during your hospital stay. If you have any questions about your discharge medications or the care you received while you were in the hospital after you are discharged, you can call the unit and asked to speak with the hospitalist on call if the hospitalist that took care  of you is not available. Once you are discharged, your primary care physician will handle any further medical issues. Please note that NO REFILLS for any discharge medications will be authorized once you are discharged, as it is imperative that you return to your primary care physician (or establish a relationship with a primary care physician if you do not have one) for your aftercare needs so that they can reassess your need for medications and monitor your lab values.     Today   Bradycardia improved.  Asymptomatic.  No shortness of breath.  Remains in a sinus rhythm.  Will discharge home today.  VITAL SIGNS:  Blood pressure (!) 141/54, pulse 63, temperature (!) 97.5 F (36.4 C), temperature source Oral, resp. rate 20, weight 69 kg (152 lb 1.6 oz), SpO2 92 %.  I/O:    Intake/Output Summary (Last 24 hours) at 03/27/2018 1553 Last  data filed at 03/27/2018 1024 Gross per 24 hour  Intake 240 ml  Output 900 ml  Net -660 ml    PHYSICAL EXAMINATION:  GENERAL:  77 y.o.-year-old patient lying in the bed with no acute distress.  EYES: Pupils equal, round, reactive to light and accommodation. No scleral icterus. Extraocular muscles intact.  HEENT: Head atraumatic, normocephalic. Oropharynx and nasopharynx clear.  NECK:  Supple, no jugular venous distention. No thyroid enlargement, no tenderness.  LUNGS: Normal breath sounds bilaterally, no wheezing, rales,rhonchi. No use of accessory muscles of respiration.  CARDIOVASCULAR: S1, S2 normal. No murmurs, rubs, or gallops.  ABDOMEN: Soft, non-tender, non-distended. Bowel sounds present. No organomegaly or mass.  EXTREMITIES: No pedal edema, cyanosis, or clubbing.  NEUROLOGIC: Cranial nerves II through XII are intact. No focal motor or sensory defecits b/l.  PSYCHIATRIC: The patient is alert and oriented x 3.  SKIN: No obvious rash, lesion, or ulcer.   DATA REVIEW:   CBC Recent Labs  Lab 03/26/18 0346  WBC 10.0  HGB 12.2  HCT 35.9  PLT 301    Chemistries  Recent Labs  Lab 03/21/18 0816  03/26/18 0346  NA 139   < > 138  K 3.5   < > 3.4*  CL 104   < > 103  CO2 26   < > 24  GLUCOSE 145*   < > 135*  BUN 15   < > 21*  CREATININE 0.78   < > 0.78  CALCIUM 9.1   < > 8.8*  AST 29  --   --   ALT 25  --   --   ALKPHOS 65  --   --   BILITOT 0.8  --   --    < > = values in this interval not displayed.    Cardiac Enzymes Recent Labs  Lab 03/25/18 1951  TROPONINI <0.03    Microbiology Results  No results found for this or any previous visit.  RADIOLOGY:  No results found.    Management plans discussed with the patient, family and they are in agreement.  CODE STATUS:     Code Status Orders  (From admission, onward)        Start     Ordered   03/25/18 0736  Full code  Continuous     03/25/18 0740    TOTAL TIME TAKING CARE OF THIS PATIENT: 40  minutes.    Henreitta Leber M.D on 03/27/2018 at 3:53 PM  Between 7am to 6pm - Pager - [phone]  After 6pm go to [email or web address] - [facility]  [facility]  Office  [phone]  CC: Primary care physician; Steele Sizer, MD

## 2018-03-27 NOTE — Plan of Care (Signed)
  Problem: Clinical Measurements: Goal: Ability to maintain clinical measurements within normal limits will improve Outcome: Adequate for Discharge   Problem: Clinical Measurements: Goal: Respiratory complications will improve Outcome: Adequate for Discharge   Problem: Clinical Measurements: Goal: Cardiovascular complication will be avoided Outcome: Adequate for Discharge   Problem: Activity: Goal: Risk for activity intolerance will decrease Outcome: Adequate for Discharge   Problem: Cardiac: Goal: Ability to achieve and maintain adequate cardiopulmonary perfusion will improve Outcome: Adequate for Discharge   Problem: Education: Goal: Knowledge of disease or condition will improve Outcome: Adequate for Discharge

## 2018-03-27 NOTE — Telephone Encounter (Signed)
Age 77 years 03/22/2018 Wt 70.8 03/22/2018 Saw Dayna Dunn Has appt to see Dr Rockey Situ on 04/04/2018 03/26/2018 Hgb 12.2 HCT 35.9  SrCr 0.78 CrCl 69.65  Refill done for Xarelto 20 mg daily as requested

## 2018-03-28 ENCOUNTER — Telehealth: Payer: Self-pay

## 2018-03-28 NOTE — Telephone Encounter (Signed)
Transition Care Management Follow-Up Telephone Call   Date discharged and where: 03/27/18 from [facility]  How have you been since you were released from the hospital?  States she is feeling much better. Further states she got up this morning and walked around [employer] without any concerns or issues. Denies chest pain, dyspnea, dizziness, headache or increased pedal edema. Confirmed she is weighing herself daily and has not noticed any weight gain since discharge.  Any patient concerns? None  Items Reviewed: Verified = V   Meds: V  Allergies: V  Dietary Orders: Heart Healthy diet. Verbalized acceptance and understanding about dietary limitations and restrictions.  Functional Questionnaire:  Independent = I Dependent = D  ADLs: I  Dressing- I   Eating- I  Maintaining continence- I  Transferring- I  Transportation- I  Meal Prep- I  Managing Meds- I  Additional Items Reviewed:  Hospital f/u appt confirmed? Yes. Scheduled to see Suezanne Cheshire, NP on 04/03/18 @ 2:20pm. Denies need for transportation arrangements. Also confirmed f/u appt with Dr. Rockey Situ on 04/04/18  If their condition worsens, is the pt aware to call PCP or go to the Emergency Dept.? Yes. Verbalized acceptance and understanding  Was the patient provided with contact information for the PCP's office or ED? Yes  Was to pt encouraged to call back with questions or concerns? Yes

## 2018-03-29 ENCOUNTER — Telehealth: Payer: Self-pay | Admitting: Cardiovascular Disease

## 2018-03-29 NOTE — Telephone Encounter (Signed)
Patient c/o Palpitations:  High priority if patient c/o lightheadedness, shortness of breath, or chest pain  1) How long have you had palpitations/irregular HR/ Afib? Are you having the symptoms now? Since yesterday  2) Are you currently experiencing lightheadedness, SOB or CP? Sob shaky   3) Do you have a history of afib (atrial fibrillation) or irregular heart rhythm ? Yes s/p cardioversion   4) Have you checked your BP or HR? (document readings if available): HR now 120-140  5) Are you experiencing any other symptoms? spb shaky maybe nerves not sure what to do now

## 2018-03-29 NOTE — Telephone Encounter (Signed)
Spoke with patient and reviewed that provider recommended she start Toprol XL 25 mg twice a day if SBP greater than 100 and heart rate greater than 100. She verbalized understanding of all instructions, medication changes, and has no further questions at this time.

## 2018-03-29 NOTE — Telephone Encounter (Signed)
Sounds like she may be back in Afib. Would need EKG to confirm. She was not continued on Toprol XL at discharge given bradycardia it appears. If she has Toprol XL at home, could take 25 mg bid (if BP > 161 mmHg systolic and if HR > 096 bpm). Would continue amiodarone and Xarelto.

## 2018-03-29 NOTE — Telephone Encounter (Signed)
Spoke with patient and she states that she was good yesterday but today not felt well. BP was 140/89 heart rate 124 and was up to 140 earlier today. She does report shortness of breath and she just don't feel well. Reviewed signs and symptoms to monitor which would require immediate evaluation in the ED. Advised that I would route to provider and if there are any further recommendations we will give her a call. She does have appointment next week here in our office. She verbalized understanding with no further questions.

## 2018-04-01 NOTE — Anesthesia Postprocedure Evaluation (Signed)
Anesthesia Post Note  Patient: [name]  Procedure(s) Performed: CARDIOVERSION (N/A )  Patient location during evaluation: PACU Anesthesia Type: General Level of consciousness: awake and alert Pain management: pain level controlled Vital Signs Assessment: post-procedure vital signs reviewed and stable Respiratory status: spontaneous breathing, nonlabored ventilation, respiratory function stable and patient connected to nasal cannula oxygen Cardiovascular status: blood pressure returned to baseline and stable Postop Assessment: no apparent nausea or vomiting Anesthetic complications: no     Last Vitals:  Vitals:   03/27/18 0318 03/27/18 0723  BP: (!) 140/52 (!) 141/54  Pulse: 60 63  Resp: 18 20  Temp: (!) 36.4 C (!) 36.4 C  SpO2: 92% 92%    Last Pain:  Vitals:   03/27/18 0941  TempSrc:   PainSc: 1                  Molli Barrows

## 2018-04-03 ENCOUNTER — Inpatient Hospital Stay: Payer: Medicare Other | Admitting: Nurse Practitioner

## 2018-04-03 NOTE — Progress Notes (Signed)
Cardiology Office Note  Date:  04/04/2018   ID:  [name], DOB Jul 02, 1941, MRN [id number]  PCP:  Steele Sizer, MD   Chief Complaint  Patient presents with  . OTHER    1 month f/u c/o chest discomfort, unable to sleep at night and coughing up clear phelgm. Meds reviewed verbally with pt.    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 Lovena Le to have sinus tachycardia, lost to cardiology follow-up Who presents for follow-up of her paroxysmal  atrial fibrillation  Was in the hospital with atrial fib with RVR  Atrial fibrillation with rapid ventricular response-   admitted to the hospital and placed on a oral amiodarone bolus,  Metoprolol. It appears that diltiazem was held  Patient underwent electrical cardioversion   normal sinus rhythm obtained discharged on only amiodarone 200 twice a day with no diltiazem or metoprolol  Maintained NSR for 2 days after d/c Then back into atrial fib Some cough , Chronic congestion Bad neck pain, scheduled to see pain clinic  Bothered by palpitations at nighttime, would like to restore normal sinus rhythm  EKG personally reviewed by myself on todays visit Shows atrial fibrillation with left bundle branch block rate 95 bpm  Other past medical history reviewed Event monitor She is having paroxysmal atrial fibrillation  She reports having mild carotid stenosis on the left Initially found on x-ray of neck.  vascular study done back in 2011 that showed mild plaque formation. Repeat ultrasound April 2019 with plaque noted,  less than 50%   EKG dated 12/19/2017  Showing periods of irregular rhythm, as well as normal sinus rhythm rate 90 bpm Fusion beats, PVCs left bundle branch block unable to exclude Paroxysmal atrial fibrillation   PMH:   has a past medical history of Bronchitis, Chronic  combined systolic (congestive) and diastolic (congestive) heart failure ([place]), DJD (degenerative joint disease), cervical, History of stress test, Hyperlipidemia, Hypertension, Hypothyroid, LBBB (left bundle branch block), Leg pain, Persistent atrial fibrillation ([place]), and SVT (supraventricular tachycardia) ([place]).  PSH:    Past Surgical History:  Procedure Laterality Date  . CARDIOVERSION N/A 03/26/2018   Procedure: CARDIOVERSION;  Surgeon: Nelva Bush, MD;  Location: [facility] ORS;  Service: Cardiovascular;  Laterality: N/A;  . ELECTROPHYSIOLOGY STUDY N/A 02/27/2012   Procedure: ELECTROPHYSIOLOGY STUDY;  Surgeon: Evans Lance, MD;  Location: [facility] CATH LAB;  Service: Cardiovascular;  Laterality: N/A;  . EPS and ablation for SVT  5/13   slow pathway ablation by Dr Lovena Le  . EYE SURGERY     surgery for detatched retina  . SUPRAVENTRICULAR TACHYCARDIA ABLATION N/A 02/27/2012   Procedure: SUPRAVENTRICULAR TACHYCARDIA ABLATION;  Surgeon: Evans Lance, MD;  Location: [facility] CATH LAB;  Service: Cardiovascular;  Laterality: N/A;    Current Outpatient Medications  Medication Sig Dispense Refill  . acetaminophen (TYLENOL) 650 MG CR tablet Take 1,300 mg by mouth every 8 (eight) hours as needed for pain.    Marland Kitchen amiodarone (PACERONE) 200 MG tablet Take 400 mg PO BID X 4 days, then take 200 mg PO BID 60 tablet 1  . atorvastatin (LIPITOR) 20 MG tablet TAKE 1 TABLET(20 MG) BY MOUTH DAILY AT 6 PM 90 tablet 0  . busPIRone (BUSPAR) 5 MG tablet Take 1 tablet (5 mg total) by mouth 2 (two) times daily. 180 tablet 1  . cholecalciferol (VITAMIN D)  1000 UNITS tablet Take 2,000 Units by mouth daily.    Marland Kitchen ezetimibe (ZETIA) 10 MG tablet Take 1 tablet (10 mg total) by mouth daily. 30 tablet 11  . furosemide (LASIX) 20 MG tablet Take 1 tablet (20 mg total) by mouth daily. 30 tablet 1  . levothyroxine (SYNTHROID, LEVOTHROID) 137 MCG tablet TAKE 1 TABLET(137 MCG) BY MOUTH EVERY MORNING 90 tablet 0  . metoprolol succinate  (TOPROL-XL) 25 MG 24 hr tablet Take 25 mg by mouth 2 (two) times daily as needed.    . Multiple Vitamin (MULITIVITAMIN WITH MINERALS) TABS Take 1 tablet by mouth daily.    . potassium chloride (K-DUR) 10 MEQ tablet Take 1 tablet (10 mEq total) by mouth daily. 30 tablet 1  . valsartan (DIOVAN) 160 MG tablet Take 1 tablet (160 mg total) by mouth daily. 30 tablet 1  . XARELTO 20 MG TABS tablet TAKE 1 TABLET BY MOUTH DAILY WITH SUPPER 30 tablet 5   No current facility-administered medications for this visit.      Allergies:   Codeine; Erythromycin; Septra [sulfamethoxazole-trimethoprim]; and Penicillins   Social History:  The patient  reports that she has never smoked. She has never used smokeless tobacco. She reports that she does not drink alcohol or use drugs.   Family History:   family history includes Alzheimer's disease in her mother; Cancer in her father and paternal aunt; Stroke in her father; Thyroid disease in her maternal grandmother.    Review of Systems: Review of Systems  Constitutional: Negative.   Respiratory: Positive for cough.   Cardiovascular: Positive for palpitations.       Tachycardia  Gastrointestinal: Negative.   Musculoskeletal: Negative.   Neurological: Negative.   Psychiatric/Behavioral: Negative.   All other systems reviewed and are negative.    PHYSICAL EXAM: VS:  BP 134/84 (BP Location: Left Arm, Patient Position: Sitting, Cuff Size: Normal)   Pulse 95   Ht 5\' 6"  (1.676 m)   Wt 154 lb 8 oz (70.1 kg)   BMI 24.94 kg/m  , BMI Body mass index is 24.94 kg/m.  o significant change in exam Constitutional:  oriented to person, place, and time. No distress.  HENT:  Head: Normocephalic and atraumatic.  Eyes:  no discharge. No scleral icterus.  Neck: Normal range of motion. Neck supple. No JVD present.  Cardiovascular: Irregularly irregular, normal heart sounds and intact distal pulses. Exam reveals no gallop and no friction rub. No edema No murmur  heard. Pulmonary/Chest: Effort normal and breath sounds normal. No stridor. No respiratory distress.  no wheezes.  no rales.  no tenderness.  Abdominal: Soft.  no distension.  no tenderness.  Musculoskeletal: Normal range of motion.  no  tenderness or deformity.  Neurological:  normal muscle tone. Coordination normal. No atrophy Skin: Skin is warm and dry. No rash noted. not diaphoretic.  Psychiatric:  normal mood and affect. behavior is normal. Thought content normal.    Recent Labs: 09/04/2017: TSH 3.97 03/21/2018: ALT 25 03/25/2018: B Natriuretic Peptide 452.0 03/26/2018: BUN 21; Creatinine, Ser 0.78; Hemoglobin 12.2; Platelets 301; Potassium 3.4; Sodium 138    Lipid Panel Lab Results  Component Value Date   CHOL 173 09/04/2017   HDL 60 09/04/2017   LDLCALC 87 09/04/2017   TRIG 159 (H) 09/04/2017      Wt Readings from Last 3 Encounters:  04/04/18 154 lb 8 oz (70.1 kg)  03/27/18 152 lb 1.6 oz (69 kg)  03/22/18 156 lb (70.8 kg)  ASSESSMENT AND PLAN:  Atrial fibrillation, new onset ([place]) - Plan: LONG TERM MONITOR (3-14 DAYS) Diltiazem previously held presumably for mildly depressed ejection fraction in the setting of atrial fibrillation Recent discharge from the hospital on amiodarone only without metoprolol secondary to bradycardia. 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 Limited by bradycardia  PAD (peripheral artery disease) ([place]) - Plan: EKG 12-Lead Carotid stenosis less than 50% on the left  Recommended LDL less than 70 Statin with Zetia. No changes made  SVT (supraventricular tachycardia) (HCC) - Plan: EKG  12-Lead Medication changes as above On amiodarone and metoprolol  Mixed hyperlipidemia Lipitor 20, Zetia 10 mg daily  Essential hypertension Increased metoprolol as above blood pressure stable  Disposition:   F/U  1 month   Total encounter time more than 45 minutes  Greater than 50% was spent in counseling and coordination of care with the patient    Orders Placed This Encounter  Procedures  . EKG 12-Lead     Signed, Esmond Plants, M.D., Ph.D. 04/04/2018  [place], [place]

## 2018-04-04 ENCOUNTER — Encounter: Payer: Self-pay | Admitting: Cardiovascular Disease

## 2018-04-04 ENCOUNTER — Ambulatory Visit: Payer: Medicare Other | Admitting: Cardiovascular Disease

## 2018-04-04 VITALS — BP 134/84 | HR 95 | Ht 66.0 in | Wt 154.5 lb

## 2018-04-04 DIAGNOSIS — I4891 Unspecified atrial fibrillation: Secondary | ICD-10-CM | POA: Diagnosis not present

## 2018-04-04 DIAGNOSIS — I739 Peripheral vascular disease, unspecified: Secondary | ICD-10-CM | POA: Diagnosis not present

## 2018-04-04 DIAGNOSIS — E782 Mixed hyperlipidemia: Secondary | ICD-10-CM | POA: Diagnosis not present

## 2018-04-04 DIAGNOSIS — I471 Supraventricular tachycardia: Secondary | ICD-10-CM | POA: Diagnosis not present

## 2018-04-04 MED ORDER — METOPROLOL SUCCINATE ER 50 MG PO TB24: 50.0000 mg | ORAL_TABLET | Freq: Two times a day (BID) | ORAL | Status: DC

## 2018-04-04 MED ORDER — AMIODARONE HCL 200 MG PO TABS: 200.0000 mg | ORAL_TABLET | Freq: Two times a day (BID) | ORAL | 1 refills | Status: DC

## 2018-04-04 NOTE — Patient Instructions (Signed)
Medication Instructions:   Increase the metoprolol up to 50 mg twice a day  Stay on amiodarone 200 mg twice a day  Call if still in atrial fibrillation in a week  Labwork:  No new labs needed  Testing/Procedures:  No further testing at this time   Follow-Up: It was a pleasure seeing you in the office today. Please call us if you have new issues that need to be addressed before your next appt.  [phone]  Your physician wants you to follow-up in: 1 month.   If you need a refill on your cardiac medications before your next appointment, please call your pharmacy.  For educational health videos Log in to : [email or web address] Or : [email or web address], password : [email or web address]

## 2018-04-05 ENCOUNTER — Ambulatory Visit (INDEPENDENT_AMBULATORY_CARE_PROVIDER_SITE_OTHER): Payer: Medicare Other | Admitting: Family Medicine

## 2018-04-05 ENCOUNTER — Encounter: Payer: Self-pay | Admitting: Family Medicine

## 2018-04-05 VITALS — BP 158/88 | HR 87 | Temp 97.7°F | Resp 16 | Ht 66.0 in | Wt 153.1 lb

## 2018-04-05 DIAGNOSIS — I482 Chronic atrial fibrillation, unspecified: Secondary | ICD-10-CM

## 2018-04-05 DIAGNOSIS — I5043 Acute on chronic combined systolic (congestive) and diastolic (congestive) heart failure: Secondary | ICD-10-CM | POA: Insufficient documentation

## 2018-04-05 DIAGNOSIS — F5102 Adjustment insomnia: Secondary | ICD-10-CM

## 2018-04-05 DIAGNOSIS — I5042 Chronic combined systolic (congestive) and diastolic (congestive) heart failure: Secondary | ICD-10-CM | POA: Diagnosis not present

## 2018-04-05 DIAGNOSIS — R0989 Other specified symptoms and signs involving the circulatory and respiratory systems: Secondary | ICD-10-CM

## 2018-04-05 DIAGNOSIS — Z09 Encounter for follow-up examination after completed treatment for conditions other than malignant neoplasm: Secondary | ICD-10-CM | POA: Diagnosis not present

## 2018-04-05 DIAGNOSIS — E039 Hypothyroidism, unspecified: Secondary | ICD-10-CM

## 2018-04-05 DIAGNOSIS — I272 Pulmonary hypertension, unspecified: Secondary | ICD-10-CM | POA: Diagnosis not present

## 2018-04-05 DIAGNOSIS — I471 Supraventricular tachycardia: Secondary | ICD-10-CM

## 2018-04-05 DIAGNOSIS — I10 Essential (primary) hypertension: Secondary | ICD-10-CM

## 2018-04-05 LAB — COMPLETE METABOLIC PANEL WITH GFR
AG Ratio: 2.1 (calc) (ref 1.0–2.5)
ALBUMIN MSPROF: 4.6 g/dL (ref 3.6–5.1)
ALKALINE PHOSPHATASE (APISO): 65 U/L (ref 33–130)
ALT: 23 U/L (ref 6–29)
AST: 25 U/L (ref 10–35)
BUN / CREAT RATIO: 18 (calc) (ref 6–22)
BUN: 18 mg/dL (ref 7–25)
CALCIUM: 9.3 mg/dL (ref 8.6–10.4)
CO2: 26 mmol/L (ref 20–32)
CREATININE: 1.01 mg/dL — AB (ref 0.60–0.93)
Chloride: 103 mmol/L (ref 98–110)
GFR, EST AFRICAN AMERICAN: 63 mL/min/{1.73_m2} (ref >=60)
GFR, EST NON AFRICAN AMERICAN: 54 mL/min/{1.73_m2} — AB (ref >=60)
GLOBULIN: 2.2 g/dL (ref 1.9–3.7)
Glucose, Bld: 150 mg/dL — ABNORMAL HIGH (ref 65–139)
Potassium: 3.9 mmol/L (ref 3.5–5.3)
Sodium: 138 mmol/L (ref 135–146)
TOTAL PROTEIN: 6.8 g/dL (ref 6.1–8.1)
Total Bilirubin: 0.7 mg/dL (ref 0.2–1.2)

## 2018-04-05 LAB — TSH: TSH: 4.13 mIU/L (ref 0.40–4.50)

## 2018-04-05 MED ORDER — GUAIFENESIN ER 600 MG PO TB12: 600.0000 mg | ORAL_TABLET | Freq: Two times a day (BID) | ORAL | 0 refills | Status: DC

## 2018-04-05 MED ORDER — TEMAZEPAM 15 MG PO CAPS: 15.0000 mg | ORAL_CAPSULE | Freq: Every evening | ORAL | 0 refills | Status: DC | PRN

## 2018-04-05 NOTE — Progress Notes (Signed)
Name: [name]   MRN: [id number]    DOB: Oct 09, 1941   Date:04/05/2018       Progress Note  Subjective  Chief Complaint  Chief Complaint  Patient presents with  . Hospitalization Follow-up    Went to the ER for SOB and Pulse was running 140. They did a Cardioversion and she seen Dr. Rockey Situ. Diagnosis with CHF  . Congestive Heart Failure    HPI  Hospital discharge follow up: she developed worsening of SOB and palpitation, heart rate went up to 140 range. She has a history of SVT and back in 2013 had an ablation. Diagnosed with afib 12/19/2017. She was under control with metoprolol and Xarelto to preventing clotting. However with symptoms change she went to [facility] on 06/03 and was given amiodarone bolus, also had a cardioversion on 03/26/2018 however two days after discharge she started to have palpitation again. She had been discharged off metoprolol and Dr. Rockey Situ re-started when symptoms resumed and yesterday during her follow up visit, dose was increased again. BP at his office was at goal, but elevated today. She continues to feel tired, lack of energy also difficulty eating food without salt. She has been compliant with medication, still has some palpitation intermittently. Lost some weight since discharged, she states edema of lower legs has resolved with lasix. Overall she is feeling much better. She walked a couple of blocks with her boyfriend yesterday. She has been having chest congestion for weeks and is afraid of taking anything otc. Sputum is clear to white no fever or chills. We will try otc mucinex, also having difficulty sleeping at night, , advised to raise the head of bed since CHF causes orthopnea, and we will try low dose temazepam to see if helps with her sleep, but advised to try raising the head of the bed, take melatonin first.    Echo before hospital stay showed mild to moderate pulmonary hypertension, mildly reduced LVEF, and moderate MR/TR    Patient Active Problem  List   Diagnosis Date Noted  . Pulmonary hypertension ([place]) 04/05/2018  . A-fib ([place]) 03/25/2018  . Paroxysmal sinus tachycardia ([place]) 01/29/2018  . Leg pain 01/04/2018  . PAD (peripheral artery disease) ([place]) 01/04/2018  . Carotid artery calcification, bilateral 12/19/2017  . Cervical radiculitis 12/19/2017  . Hyperglycemia 03/06/2016  . Hypothyroid 04/21/2015  . 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 Anxiety 04/21/2015  . Hyperlipidemia 04/21/2015  . Diuretic-induced hypokalemia 04/21/2015  . Palpitations 12/18/2012  . SVT (supraventricular tachycardia) ([place]) 05/27/2012  . Hypertension 05/27/2012    Past Surgical History:  Procedure Laterality Date  . CARDIOVERSION N/A 03/26/2018   Procedure: CARDIOVERSION;  Surgeon: Nelva Bush, MD;  Location: [facility] ORS;  Service: Cardiovascular;  Laterality: N/A;  . ELECTROPHYSIOLOGY STUDY N/A 02/27/2012   Procedure: ELECTROPHYSIOLOGY STUDY;  Surgeon: Evans Lance, MD;  Location: [facility] CATH LAB;  Service: Cardiovascular;  Laterality: N/A;  . EPS and ablation for SVT  5/13   slow pathway ablation by Dr Lovena Le  . EYE SURGERY     surgery for detatched retina  . SUPRAVENTRICULAR TACHYCARDIA ABLATION N/A 02/27/2012   Procedure: SUPRAVENTRICULAR TACHYCARDIA ABLATION;  Surgeon: Evans Lance, MD;  Location: [facility] CATH LAB;  Service: Cardiovascular;  Laterality: N/A;    Family History  Problem Relation Age of Onset  . Alzheimer's disease Mother   . Cancer Father   . Stroke Father   . Cancer Paternal Aunt   . Thyroid disease Maternal Grandmother  Social History   Socioeconomic History  . Marital status: Widowed    Spouse name: Not on file  . Number of children: Not on file  . Years of education: Not on file  . Highest education level: Not on file  Occupational History  . Not on file  Social Needs  . Financial resource strain: Not on file  . Food insecurity:    Worry: Not on file    Inability: Not on file  .  Transportation needs:    Medical: Not on file    Non-medical: Not on file  Tobacco Use  . Smoking status: Never Smoker  . Smokeless tobacco: Never Used  Substance and Sexual Activity  . Alcohol use: No  . Drug use: No  . Sexual activity: Not on file  Lifestyle  . Physical activity:    Days per week: Not on file    Minutes per session: Not on file  . Stress: Not on file  Relationships  . Social connections:    Talks on phone: Not on file    Gets together: Not on file    Attends religious service: Not on file    Active member of club or organization: Not on file    Attends meetings of clubs or organizations: Not on file    Relationship status: Not on file  . Intimate partner violence:    Fear of current or ex partner: Not on file    Emotionally abused: Not on file    Physically abused: Not on file    Forced sexual activity: Not on file  Other Topics Concern  . Not on file  Social History Narrative   Lives in [place]     Current Outpatient Medications:  .  acetaminophen (TYLENOL) 650 MG CR tablet, Take 1,300 mg by mouth every 8 (eight) hours as needed for pain., Disp: , Rfl:  .  amiodarone (PACERONE) 200 MG tablet, Take 1 tablet (200 mg total) by mouth 2 (two) times daily., Disp: 60 tablet, Rfl: 1 .  atorvastatin (LIPITOR) 20 MG tablet, TAKE 1 TABLET(20 MG) BY MOUTH DAILY AT 6 PM, Disp: 90 tablet, Rfl: 0 .  busPIRone (BUSPAR) 5 MG tablet, Take 1 tablet (5 mg total) by mouth 2 (two) times daily., Disp: 180 tablet, Rfl: 1 .  cholecalciferol (VITAMIN D) 1000 UNITS tablet, Take 2,000 Units by mouth daily., Disp: , Rfl:  .  ezetimibe (ZETIA) 10 MG tablet, Take 1 tablet (10 mg total) by mouth daily., Disp: 30 tablet, Rfl: 11 .  furosemide (LASIX) 20 MG tablet, Take 1 tablet (20 mg total) by mouth daily., Disp: 30 tablet, Rfl: 1 .  levothyroxine (SYNTHROID, LEVOTHROID) 137 MCG tablet, TAKE 1 TABLET(137 MCG) BY MOUTH EVERY MORNING, Disp: 90 tablet, Rfl: 0 .  metoprolol succinate  (TOPROL-XL) 50 MG 24 hr tablet, Take 1 tablet (50 mg total) by mouth 2 (two) times daily., Disp: , Rfl:  .  Multiple Vitamin (MULITIVITAMIN WITH MINERALS) TABS, Take 1 tablet by mouth daily., Disp: , Rfl:  .  potassium chloride (K-DUR) 10 MEQ tablet, Take 1 tablet (10 mEq total) by mouth daily., Disp: 30 tablet, Rfl: 1 .  valsartan (DIOVAN) 160 MG tablet, Take 1 tablet (160 mg total) by mouth daily., Disp: 30 tablet, Rfl: 1 .  XARELTO 20 MG TABS tablet, TAKE 1 TABLET BY MOUTH DAILY WITH SUPPER, Disp: 30 tablet, Rfl: 5  Allergies  Allergen Reactions  . Codeine Nausea And Vomiting  . Erythromycin Itching and Swelling  .  Septra [Sulfamethoxazole-Trimethoprim] Swelling  . Penicillins Swelling and Rash    Has patient had a PCN reaction causing immediate rash, facial/tongue/throat swelling, SOB or lightheadedness with hypotension: Yes Has patient had a PCN reaction causing severe rash involving mucus membranes or skin necrosis: No Has patient had a PCN reaction that required hospitalization: No Has patient had a PCN reaction occurring within the last 10 years: No If all of the above answers are "NO", then may proceed with Cephalosporin use.      ROS  Constitutional: Negative for fever, positive for  weight change - had gone up to 163 lbs before hospital stay but is back to baseline.  Respiratory: Positive  for cough and shortness of breath.   Cardiovascular: Negative for chest pain but has intermittent  palpitations.  Gastrointestinal: Negative for abdominal pain, no bowel changes.  Musculoskeletal: Negative for gait problem or joint swelling.  Skin: Negative for rash.  Neurological: Negative for dizziness or headache.  No other specific complaints in a complete review of systems (except as listed in HPI above).  Objective  Vitals:   04/05/18 0841  BP: (!) 158/88  Pulse: 87  Resp: 16  Temp: 97.7 F (36.5 C)  TempSrc: Oral  SpO2: 98%  Weight: 153 lb 1.6 oz (69.4 kg)  Height: 5'  6" (1.676 m)    Body mass index is 24.71 kg/m.  Physical Exam  Constitutional: Patient appears well-developed and well-nourished.  No distress.  HEENT: head atraumatic, normocephalic, pupils equal and reactive to light,  neck supple, throat within normal limits Cardiovascular:irregular rhythm, opening click , 1/6 SEM. No BLE edema. Pulmonary/Chest: Effort normal and breath sounds normal. No respiratory distress. Abdominal: Soft.  There is no tenderness. Psychiatric: Patient has a normal mood and affect. behavior is normal. Judgment and thought content normal.  Recent Results (from the past 2160 hour(s))  Brain natriuretic peptide     Status: Abnormal   Collection Time: 03/21/18  8:16 AM  Result Value Ref Range   B Natriuretic Peptide 336.0 (H) 0.0 - 100.0 pg/mL    Comment: Performed at [facility], [address] [place], [place] [address]  Basic metabolic panel     Status: Abnormal   Collection Time: 03/21/18  8:16 AM  Result Value Ref Range   Sodium 139 135 - 145 mmol/L   Potassium 3.5 3.5 - 5.1 mmol/L   Chloride 104 101 - 111 mmol/L   CO2 26 22 - 32 mmol/L   Glucose, Bld 145 (H) 65 - 99 mg/dL   BUN 15 6 - 20 mg/dL   Creatinine, Ser 0.78 0.44 - 1.00 mg/dL   Calcium 9.1 8.9 - 10.3 mg/dL   GFR calc non Af Amer >60 >60 mL/min   GFR calc Af Amer >60 >60 mL/min    Comment: (NOTE) The eGFR has been calculated using the CKD EPI equation. This calculation has not been validated in all clinical situations. eGFR's persistently <60 mL/min signify possible Chronic Kidney Disease.    Anion gap 9 5 - 15    Comment: Performed at [facility], [address] [place], [place] [address]  CBC with Differential/Platelet     Status: None   Collection Time: 03/21/18  8:16 AM  Result Value Ref Range   WBC 6.5 3.6 - 11.0 K/uL   RBC 3.81 3.80 - 5.20 MIL/uL   Hemoglobin 12.3 12.0 - 16.0 g/dL   HCT 35.4 35.0 - 47.0 %   MCV 93.0 80.0 - 100.0 fL  MCH 32.2 26.0 - 34.0 pg    MCHC 34.6 32.0 - 36.0 g/dL   RDW 14.1 11.5 - 14.5 %   Platelets 299 150 - 440 K/uL   Neutrophils Relative % 74 %   Neutro Abs 4.7 1.4 - 6.5 K/uL   Lymphocytes Relative 17 %   Lymphs Abs 1.1 1.0 - 3.6 K/uL   Monocytes Relative 8 %   Monocytes Absolute 0.5 0.2 - 0.9 K/uL   Eosinophils Relative 1 %   Eosinophils Absolute 0.1 0 - 0.7 K/uL   Basophils Relative 0 %   Basophils Absolute 0.0 0 - 0.1 K/uL    Comment: Performed at [facility], [address] [place], [place] [address]  Hepatic function panel     Status: None   Collection Time: 03/21/18  8:16 AM  Result Value Ref Range   Total Protein 7.1 6.5 - 8.1 g/dL   Albumin 4.3 3.5 - 5.0 g/dL   AST 29 15 - 41 U/L   ALT 25 14 - 54 U/L   Alkaline Phosphatase 65 38 - 126 U/L   Total Bilirubin 0.8 0.3 - 1.2 mg/dL   Bilirubin, Direct 0.1 0.1 - 0.5 mg/dL   Indirect Bilirubin 0.7 0.3 - 0.9 mg/dL    Comment: Performed at [facility], [address] [place], [place] [address]  Basic metabolic panel     Status: Abnormal   Collection Time: 03/25/18  5:37 AM  Result Value Ref Range   Sodium 137 135 - 145 mmol/L   Potassium 3.6 3.5 - 5.1 mmol/L   Chloride 106 101 - 111 mmol/L   CO2 22 22 - 32 mmol/L   Glucose, Bld 163 (H) 65 - 99 mg/dL   BUN 21 (H) 6 - 20 mg/dL   Creatinine, Ser 0.79 0.44 - 1.00 mg/dL   Calcium 8.9 8.9 - 10.3 mg/dL   GFR calc non Af Amer >60 >60 mL/min   GFR calc Af Amer >60 >60 mL/min    Comment: (NOTE) The eGFR has been calculated using the CKD EPI equation. This calculation has not been validated in all clinical situations. eGFR's persistently <60 mL/min signify possible Chronic Kidney Disease.    Anion gap 9 5 - 15    Comment: Performed at [facility], [address] [place], [place] [address]  CBC     Status: None   Collection Time: 03/25/18  5:37 AM  Result Value Ref Range   WBC 9.6 3.6 - 11.0 K/uL   RBC 4.02 3.80 - 5.20 MIL/uL   Hemoglobin 12.8 12.0 - 16.0 g/dL   HCT  36.9 35.0 - 47.0 %   MCV 91.8 80.0 - 100.0 fL   MCH 31.9 26.0 - 34.0 pg   MCHC 34.7 32.0 - 36.0 g/dL   RDW 14.0 11.5 - 14.5 %   Platelets 322 150 - 440 K/uL    Comment: Performed at [facility], [address] [place], [place] [address]  Troponin I     Status: None   Collection Time: 03/25/18  5:37 AM  Result Value Ref Range   Troponin I <0.03 <0.03 ng/mL    Comment: Performed at [facility], [address] [place],  [address]  Brain natriuretic peptide     Status: Abnormal   Collection Time: 03/25/18  5:54 AM  Result Value Ref Range   B Natriuretic Peptide 452.0 (H) 0.0 - 100.0 pg/mL    Comment: Performed at [facility], [address] [place], [place] [address]  Troponin I     Status: None   Collection Time: 03/25/18  8:29 AM  Result Value Ref Range   Troponin I <0.03 <0.03 ng/mL    Comment: Performed at [facility], [address] [place], [place] [address]  Troponin I     Status: None   Collection Time: 03/25/18 12:58 PM  Result Value Ref Range   Troponin I <0.03 <0.03 ng/mL    Comment: Performed at [facility], [address] [place], [place] [address]  Troponin I     Status: None   Collection Time: 03/25/18  7:51 PM  Result Value Ref Range   Troponin I <0.03 <0.03 ng/mL    Comment: Performed at [facility], [address] [place], [place] [address]  Basic metabolic panel     Status: Abnormal   Collection Time: 03/26/18  3:46 AM  Result Value Ref Range   Sodium 138 135 - 145 mmol/L   Potassium 3.4 (L) 3.5 - 5.1 mmol/L   Chloride 103 101 - 111 mmol/L   CO2 24 22 - 32 mmol/L   Glucose, Bld 135 (H) 65 - 99 mg/dL   BUN 21 (H) 6 - 20 mg/dL   Creatinine, Ser 0.78 0.44 - 1.00 mg/dL   Calcium 8.8 (L) 8.9 - 10.3 mg/dL   GFR calc non Af Amer >60 >60 mL/min   GFR calc Af Amer >60 >60 mL/min    Comment: (NOTE) The eGFR has been calculated using the CKD EPI equation. This calculation has not been  validated in all clinical situations. eGFR's persistently <60 mL/min signify possible Chronic Kidney Disease.    Anion gap 11 5 - 15    Comment: Performed at [facility], [address] [place], [place] [address]  CBC     Status: None   Collection Time: 03/26/18  3:46 AM  Result Value Ref Range   WBC 10.0 3.6 - 11.0 K/uL   RBC 3.89 3.80 - 5.20 MIL/uL   Hemoglobin 12.2 12.0 - 16.0 g/dL   HCT 35.9 35.0 - 47.0 %   MCV 92.1 80.0 - 100.0 fL   MCH 31.4 26.0 - 34.0 pg   MCHC 34.1 32.0 - 36.0 g/dL   RDW 14.1 11.5 - 14.5 %   Platelets 301 150 - 440 K/uL    Comment: Performed at [facility], [address] [place], [place] [address]  Glucose, capillary     Status: Abnormal   Collection Time: 03/26/18  7:43 AM  Result Value Ref Range   Glucose-Capillary 130 (H) 65 - 99 mg/dL   Comment 1 Notify RN   Glucose, capillary     Status: Abnormal   Collection Time: 03/27/18  7:26 AM  Result Value Ref Range   Glucose-Capillary 127 (H) 65 - 99 mg/dL   Comment 1 Notify RN      PHQ2/9: Depression screen [facility] 2/9 12/04/2017 09/03/2017 05/17/2017 03/12/2017 06/06/2016  Decreased Interest 0 0 0 0 0  Down, Depressed, Hopeless 0 0 0 1 0  PHQ - 2 Score 0 0 0 1 0     Fall Risk: Fall Risk  04/05/2018 12/19/2017 12/04/2017 09/03/2017 05/17/2017  Falls in the past year? _0   Number falls in past yr: - - - - -  Injury with Fall? - - - - -  [place] for fall due to : - - - - -  Risk for fall due to: Comment - - - - -  Follow up - - - - -     Assessment & Plan  1. Hospital discharge follow-up  She has been compliant with medication, and follow ups, started to walk again  2. Chronic a-fib (HCC)  Continue Xarelto, beta-blocker and amiodarone   3. SVT (supraventricular tachycardia) (HCC)  S/p ablation  4. Essential hypertension  BP a little high today, it was at goal yesterday, she states feeling a little anxious today  - COMPLETE METABOLIC PANEL  WITH GFR Low potassium during hospital stay we will recheck labs  5. Pulmonary hypertension ([place])  Keep follow up with Dr. Rockey Situ, no symptoms of Sleep apnea  6. Chest congestion  - guaiFENesin (MUCINEX) 600 MG 12 hr tablet; Take 1 tablet (600 mg total) by mouth 2 (two) times daily.  Dispense: 60 tablet; Refill: 0  7. Hypothyroidism, unspecified type  - TSH  8. Adjustment insomnia  - temazepam (RESTORIL) 15 MG capsule; Take 1 capsule (15 mg total) by mouth at bedtime as needed for sleep.  Dispense: 30 capsule; Refill: 0  9. Chronic combined systolic (congestive) and diastolic (congestive) heart failure (HCC)  Recent echo reviewed

## 2018-04-06 ENCOUNTER — Other Ambulatory Visit: Payer: Self-pay | Admitting: Family Medicine

## 2018-04-06 DIAGNOSIS — E785 Hyperlipidemia, unspecified: Secondary | ICD-10-CM

## 2018-04-11 ENCOUNTER — Telehealth: Payer: Self-pay | Admitting: Cardiovascular Disease

## 2018-04-11 NOTE — Telephone Encounter (Signed)
Patient was told to call with update on her heart rate. She checks it in the morning before her medications and it has been running 63, 86, 71, 63, 89, 66. She also takes it again around 4 pm and it has been 88, 85, 105, 92, 94. She states that her blood pressures have been good and she has been feeling better. She stated that Dr. Rockey Situ may make changes to her medications and she does report that it still "skips around". Let her know that I would route this over to the provider and I would give her a call back with any further recommendations. She was agreeable with no further concerns.

## 2018-04-11 NOTE — Telephone Encounter (Signed)
Pt calling stating her HR is getting better but she states it is still skipping She states Dr Rockey Situ wanted an update for he was going to change her medications around  Please advise

## 2018-04-12 NOTE — Telephone Encounter (Signed)
Spoke with patient and reviewed Dr. Donivan Scull recommendations. She verbalized understanding and states that she is going out of town a few days. Let her know that I would have scheduling give her a call to get the nurse visit scheduled and we will leave her appointment with Dr. Rockey Situ as well. She was very appreciative for the call with no further questions at this time.

## 2018-04-12 NOTE — Telephone Encounter (Signed)
Good to hear that heart rate is better controlled Would recommend repeat EKG early July,  nurse visit If still in atrial fibrillation would schedule cardioversion again in effort to restore normal sinus rhythm if she is willing to proceed Restoring normal sinus rhythm with cardioversion is not mandatory. Though she would like to stay in atrial fibrillation

## 2018-04-18 DIAGNOSIS — M47812 Spondylosis without myelopathy or radiculopathy, cervical region: Secondary | ICD-10-CM | POA: Diagnosis not present

## 2018-04-18 DIAGNOSIS — M47892 Other spondylosis, cervical region: Secondary | ICD-10-CM | POA: Diagnosis not present

## 2018-04-18 DIAGNOSIS — M542 Cervicalgia: Secondary | ICD-10-CM | POA: Diagnosis not present

## 2018-04-19 ENCOUNTER — Telehealth: Payer: Self-pay | Admitting: Cardiovascular Disease

## 2018-04-19 NOTE — Telephone Encounter (Signed)
° °  [facility] Pre-operative Risk Assessment    Request for surgical clearance:  1. What type of surgery is being performed? Cervical Spine Epidural   2. When is this surgery scheduled? TBA   3. What type of clearance is required (medical clearance vs. Pharmacy clearance to hold med vs. Both)?Pharmacy   4. Are there any medications that need to be held prior to surgery and how long? Asking about Xarelto 3-5 days   5. Practice name and name of physician performing surgery? [facility] Dr Brien Few   6. What is your office phone number [phone]  7.   What is your office fax number [phone]  8.   Anesthesia type (None, local, MAC, general) ? Not listed

## 2018-04-19 NOTE — Telephone Encounter (Signed)
The patient has new onset A-fib and is pending possible DCCV.  She is currently on xarelto 20 mg once daily. She is due to come back on Monday 04/22/18 for a repeat EKG.   To Dr. Rockey Situ for recommendations regarding timing of spinal epidural injection.

## 2018-04-22 ENCOUNTER — Emergency Department: Payer: Medicare Other

## 2018-04-22 ENCOUNTER — Encounter: Payer: Self-pay | Admitting: Emergency Medicine

## 2018-04-22 ENCOUNTER — Ambulatory Visit: Payer: Medicare Other

## 2018-04-22 ENCOUNTER — Other Ambulatory Visit: Payer: Self-pay

## 2018-04-22 ENCOUNTER — Inpatient Hospital Stay
Admission: EM | Admit: 2018-04-22 | Discharge: 2018-04-25 | DRG: 291 | Disposition: A | Discharge status: Home / Self Care | Payer: Medicare Other | Attending: Internal Medicine | Admitting: Internal Medicine

## 2018-04-22 DIAGNOSIS — I447 Left bundle-branch block, unspecified: Secondary | ICD-10-CM | POA: Diagnosis not present

## 2018-04-22 DIAGNOSIS — Z79899 Other long term (current) drug therapy: Secondary | ICD-10-CM | POA: Diagnosis not present

## 2018-04-22 DIAGNOSIS — I4891 Unspecified atrial fibrillation: Secondary | ICD-10-CM | POA: Diagnosis not present

## 2018-04-22 DIAGNOSIS — Z888 Allergy status to other drugs, medicaments and biological substances status: Secondary | ICD-10-CM | POA: Diagnosis not present

## 2018-04-22 DIAGNOSIS — I429 Cardiomyopathy, unspecified: Secondary | ICD-10-CM | POA: Diagnosis present

## 2018-04-22 DIAGNOSIS — Z880 Allergy status to penicillin: Secondary | ICD-10-CM | POA: Diagnosis not present

## 2018-04-22 DIAGNOSIS — J9601 Acute respiratory failure with hypoxia: Secondary | ICD-10-CM | POA: Diagnosis present

## 2018-04-22 DIAGNOSIS — I110 Hypertensive heart disease with heart failure: Principal | ICD-10-CM | POA: Diagnosis present

## 2018-04-22 DIAGNOSIS — I5023 Acute on chronic systolic (congestive) heart failure: Secondary | ICD-10-CM

## 2018-04-22 DIAGNOSIS — Z885 Allergy status to narcotic agent status: Secondary | ICD-10-CM | POA: Diagnosis not present

## 2018-04-22 DIAGNOSIS — I5021 Acute systolic (congestive) heart failure: Secondary | ICD-10-CM

## 2018-04-22 DIAGNOSIS — E785 Hyperlipidemia, unspecified: Secondary | ICD-10-CM | POA: Diagnosis present

## 2018-04-22 DIAGNOSIS — I481 Persistent atrial fibrillation: Secondary | ICD-10-CM | POA: Diagnosis present

## 2018-04-22 DIAGNOSIS — E876 Hypokalemia: Secondary | ICD-10-CM | POA: Diagnosis present

## 2018-04-22 DIAGNOSIS — I10 Essential (primary) hypertension: Secondary | ICD-10-CM | POA: Diagnosis not present

## 2018-04-22 DIAGNOSIS — I5033 Acute on chronic diastolic (congestive) heart failure: Secondary | ICD-10-CM | POA: Diagnosis not present

## 2018-04-22 DIAGNOSIS — Z7901 Long term (current) use of anticoagulants: Secondary | ICD-10-CM

## 2018-04-22 DIAGNOSIS — I5043 Acute on chronic combined systolic (congestive) and diastolic (congestive) heart failure: Secondary | ICD-10-CM | POA: Diagnosis present

## 2018-04-22 DIAGNOSIS — R0602 Shortness of breath: Secondary | ICD-10-CM | POA: Diagnosis not present

## 2018-04-22 DIAGNOSIS — Z881 Allergy status to other antibiotic agents status: Secondary | ICD-10-CM | POA: Diagnosis not present

## 2018-04-22 DIAGNOSIS — I739 Peripheral vascular disease, unspecified: Secondary | ICD-10-CM | POA: Diagnosis not present

## 2018-04-22 DIAGNOSIS — I4819 Other persistent atrial fibrillation: Secondary | ICD-10-CM | POA: Diagnosis present

## 2018-04-22 DIAGNOSIS — E039 Hypothyroidism, unspecified: Secondary | ICD-10-CM | POA: Diagnosis present

## 2018-04-22 LAB — CBC WITH DIFFERENTIAL/PLATELET
Basophils Absolute: 0 10*3/uL (ref 0–0.1)
Basophils Relative: 1 %
EOS ABS: 0.1 10*3/uL (ref 0–0.7)
Eosinophils Relative: 1 %
HEMATOCRIT: 38.3 % (ref 35.0–47.0)
HEMOGLOBIN: 13.2 g/dL (ref 12.0–16.0)
LYMPHS ABS: 1.4 10*3/uL (ref 1.0–3.6)
Lymphocytes Relative: 18 %
MCH: 31.2 pg (ref 26.0–34.0)
MCHC: 34.4 g/dL (ref 32.0–36.0)
MCV: 90.6 fL (ref 80.0–100.0)
MONOS PCT: 5 %
Monocytes Absolute: 0.4 10*3/uL (ref 0.2–0.9)
NEUTROS ABS: 6.1 10*3/uL (ref 1.4–6.5)
NEUTROS PCT: 75 %
Platelets: 341 10*3/uL (ref 150–440)
RBC: 4.22 MIL/uL (ref 3.80–5.20)
RDW: 15.6 % — ABNORMAL HIGH (ref 11.5–14.5)
WBC: 8 10*3/uL (ref 3.6–11.0)

## 2018-04-22 LAB — COMPREHENSIVE METABOLIC PANEL
ALT: 29 U/L (ref 0–44)
ANION GAP: 10 (ref 5–15)
AST: 34 U/L (ref 15–41)
Albumin: 4.4 g/dL (ref 3.5–5.0)
Alkaline Phosphatase: 64 U/L (ref 38–126)
BILIRUBIN TOTAL: 0.7 mg/dL (ref 0.3–1.2)
BUN: 19 mg/dL (ref 8–23)
CHLORIDE: 105 mmol/L (ref 98–111)
CO2: 23 mmol/L (ref 22–32)
Calcium: 8.9 mg/dL (ref 8.9–10.3)
Creatinine, Ser: 0.95 mg/dL (ref 0.44–1.00)
GFR, EST NON AFRICAN AMERICAN: 57 mL/min — AB (ref >60)
Glucose, Bld: 224 mg/dL — ABNORMAL HIGH (ref 70–99)
POTASSIUM: 3.3 mmol/L — AB (ref 3.5–5.1)
Sodium: 138 mmol/L (ref 135–145)
TOTAL PROTEIN: 6.9 g/dL (ref 6.5–8.1)

## 2018-04-22 LAB — BRAIN NATRIURETIC PEPTIDE: B NATRIURETIC PEPTIDE 5: 614 pg/mL — AB (ref 0.0–100.0)

## 2018-04-22 LAB — TROPONIN I

## 2018-04-22 LAB — MRSA PCR SCREENING: MRSA by PCR: NEGATIVE

## 2018-04-22 LAB — MAGNESIUM: Magnesium: 2.2 mg/dL (ref 1.7–2.4)

## 2018-04-22 LAB — GLUCOSE, CAPILLARY: Glucose-Capillary: 123 mg/dL — ABNORMAL HIGH (ref 70–99)

## 2018-04-22 MED ORDER — BLISTEX MEDICATED EX OINT
TOPICAL_OINTMENT | CUTANEOUS | Status: DC | PRN
  Administered 2018-04-22: 13:00:00 via TOPICAL
  Filled 2018-04-22: qty 6.3

## 2018-04-22 MED ORDER — SENNOSIDES-DOCUSATE SODIUM 8.6-50 MG PO TABS: 1.0000 | ORAL_TABLET | Freq: Every evening | ORAL | Status: DC | PRN

## 2018-04-22 MED ORDER — GUAIFENESIN ER 600 MG PO TB12
600.0000 mg | ORAL_TABLET | Freq: Two times a day (BID) | ORAL | Status: DC
  Administered 2018-04-22 – 2018-04-25 (×6): 600 mg via ORAL
  Filled 2018-04-22 (×7): qty 1

## 2018-04-22 MED ORDER — AMIODARONE HCL 200 MG PO TABS
200.0000 mg | ORAL_TABLET | Freq: Two times a day (BID) | ORAL | Status: DC
  Administered 2018-04-22: 200 mg via ORAL
  Filled 2018-04-22: qty 1

## 2018-04-22 MED ORDER — METOPROLOL TARTRATE 50 MG PO TABS
75.0000 mg | ORAL_TABLET | Freq: Three times a day (TID) | ORAL | Status: DC
  Administered 2018-04-22 – 2018-04-23 (×6): 75 mg via ORAL
  Filled 2018-04-22 (×6): qty 2

## 2018-04-22 MED ORDER — SODIUM CHLORIDE 0.9 % IV SOLN
250.0000 mL | INTRAVENOUS | Status: DC | PRN
  Administered 2018-04-24: 1000 mL via INTRAVENOUS

## 2018-04-22 MED ORDER — RIVAROXABAN 20 MG PO TABS
20.0000 mg | ORAL_TABLET | Freq: Every day | ORAL | Status: DC
  Administered 2018-04-22 – 2018-04-24 (×3): 20 mg via ORAL
  Filled 2018-04-22 (×3): qty 1

## 2018-04-22 MED ORDER — ENALAPRILAT 1.25 MG/ML IV SOLN: 1.2500 mg | Freq: Once | INTRAVENOUS | Status: DC

## 2018-04-22 MED ORDER — TEMAZEPAM 15 MG PO CAPS
15.0000 mg | ORAL_CAPSULE | Freq: Every evening | ORAL | Status: DC | PRN
  Administered 2018-04-22 – 2018-04-24 (×3): 15 mg via ORAL
  Filled 2018-04-22 (×3): qty 1

## 2018-04-22 MED ORDER — VITAMIN D 1000 UNITS PO TABS
2000.0000 [IU] | ORAL_TABLET | Freq: Every day | ORAL | Status: DC
  Administered 2018-04-22 – 2018-04-25 (×4): 2000 [IU] via ORAL
  Filled 2018-04-22 (×4): qty 2

## 2018-04-22 MED ORDER — ASPIRIN 81 MG PO CHEW
324.0000 mg | CHEWABLE_TABLET | Freq: Once | ORAL | Status: AC
  Administered 2018-04-22: 324 mg via ORAL
  Filled 2018-04-22: qty 4

## 2018-04-22 MED ORDER — BUSPIRONE HCL 5 MG PO TABS
5.0000 mg | ORAL_TABLET | Freq: Two times a day (BID) | ORAL | Status: DC
  Administered 2018-04-22 – 2018-04-25 (×7): 5 mg via ORAL
  Filled 2018-04-22 (×9): qty 1

## 2018-04-22 MED ORDER — ACETAMINOPHEN 325 MG PO TABS
650.0000 mg | ORAL_TABLET | Freq: Four times a day (QID) | ORAL | Status: DC | PRN
  Administered 2018-04-22 (×2): 650 mg via ORAL
  Filled 2018-04-22 (×2): qty 2

## 2018-04-22 MED ORDER — SODIUM CHLORIDE 0.9% FLUSH
3.0000 mL | INTRAVENOUS | Status: DC | PRN
  Administered 2018-04-22: 3 mL via INTRAVENOUS
  Filled 2018-04-22: qty 3

## 2018-04-22 MED ORDER — POTASSIUM CHLORIDE ER 10 MEQ PO TBCR
20.0000 meq | EXTENDED_RELEASE_TABLET | Freq: Every day | ORAL | Status: DC
  Administered 2018-04-23: 20 meq via ORAL
  Filled 2018-04-22 (×2): qty 2

## 2018-04-22 MED ORDER — IRBESARTAN 150 MG PO TABS
150.0000 mg | ORAL_TABLET | Freq: Every day | ORAL | Status: DC
  Administered 2018-04-22 – 2018-04-24 (×3): 150 mg via ORAL
  Filled 2018-04-22 (×3): qty 1

## 2018-04-22 MED ORDER — AMIODARONE HCL 200 MG PO TABS
400.0000 mg | ORAL_TABLET | Freq: Two times a day (BID) | ORAL | Status: DC
Start: 2018-04-22 — End: 2018-04-24
  Administered 2018-04-22 – 2018-04-23 (×3): 400 mg via ORAL
  Filled 2018-04-22 (×3): qty 2

## 2018-04-22 MED ORDER — LIP MEDEX EX OINT: 1.0000 "application " | TOPICAL_OINTMENT | CUTANEOUS | Status: DC | PRN

## 2018-04-22 MED ORDER — METOPROLOL SUCCINATE ER 50 MG PO TB24
50.0000 mg | ORAL_TABLET | Freq: Two times a day (BID) | ORAL | Status: DC
  Administered 2018-04-22: 50 mg via ORAL
  Filled 2018-04-22: qty 1

## 2018-04-22 MED ORDER — ATORVASTATIN CALCIUM 20 MG PO TABS
20.0000 mg | ORAL_TABLET | Freq: Every day | ORAL | Status: DC
  Administered 2018-04-22 – 2018-04-24 (×3): 20 mg via ORAL
  Filled 2018-04-22 (×3): qty 1

## 2018-04-22 MED ORDER — FUROSEMIDE 10 MG/ML IJ SOLN: 20.0000 mg | Freq: Once | INTRAMUSCULAR | Status: DC

## 2018-04-22 MED ORDER — ACETAMINOPHEN 650 MG RE SUPP: 650.0000 mg | Freq: Four times a day (QID) | RECTAL | Status: DC | PRN

## 2018-04-22 MED ORDER — ONDANSETRON HCL 4 MG/2ML IJ SOLN
4.0000 mg | Freq: Four times a day (QID) | INTRAMUSCULAR | Status: DC | PRN
Start: 2018-04-22 — End: 2018-04-25

## 2018-04-22 MED ORDER — EZETIMIBE 10 MG PO TABS
10.0000 mg | ORAL_TABLET | Freq: Every day | ORAL | Status: DC
  Administered 2018-04-22 – 2018-04-25 (×4): 10 mg via ORAL
  Filled 2018-04-22 (×4): qty 1

## 2018-04-22 MED ORDER — ALBUTEROL SULFATE (2.5 MG/3ML) 0.083% IN NEBU: 2.5000 mg | INHALATION_SOLUTION | RESPIRATORY_TRACT | Status: DC | PRN

## 2018-04-22 MED ORDER — FUROSEMIDE 10 MG/ML IJ SOLN
40.0000 mg | Freq: Two times a day (BID) | INTRAMUSCULAR | Status: DC
  Administered 2018-04-22 – 2018-04-23 (×2): 40 mg via INTRAVENOUS
  Filled 2018-04-22 (×2): qty 4

## 2018-04-22 MED ORDER — BISACODYL 5 MG PO TBEC: 5.0000 mg | DELAYED_RELEASE_TABLET | Freq: Every day | ORAL | Status: DC | PRN

## 2018-04-22 MED ORDER — ONDANSETRON HCL 4 MG PO TABS: 4.0000 mg | ORAL_TABLET | Freq: Four times a day (QID) | ORAL | Status: DC | PRN

## 2018-04-22 MED ORDER — POTASSIUM CHLORIDE ER 10 MEQ PO TBCR
10.0000 meq | EXTENDED_RELEASE_TABLET | Freq: Every day | ORAL | Status: DC
  Administered 2018-04-22: 10 meq via ORAL
  Filled 2018-04-22: qty 1

## 2018-04-22 MED ORDER — NITROGLYCERIN 2 % TD OINT: 0.5000 [in_us] | TOPICAL_OINTMENT | Freq: Four times a day (QID) | TRANSDERMAL | Status: DC

## 2018-04-22 MED ORDER — NITROGLYCERIN IN D5W 200-5 MCG/ML-% IV SOLN
0.0000 ug/min | Freq: Once | INTRAVENOUS | Status: AC
  Administered 2018-04-22: 5 ug/min via INTRAVENOUS
  Filled 2018-04-22: qty 250

## 2018-04-22 MED ORDER — LEVOTHYROXINE SODIUM 137 MCG PO TABS
137.0000 ug | ORAL_TABLET | Freq: Every day | ORAL | Status: DC
  Administered 2018-04-22 – 2018-04-25 (×4): 137 ug via ORAL
  Filled 2018-04-22 (×4): qty 1

## 2018-04-22 MED ORDER — FUROSEMIDE 10 MG/ML IJ SOLN
20.0000 mg | Freq: Two times a day (BID) | INTRAMUSCULAR | Status: DC
  Administered 2018-04-22: 20 mg via INTRAVENOUS
  Filled 2018-04-22: qty 2

## 2018-04-22 MED ORDER — SODIUM CHLORIDE 0.9% FLUSH
3.0000 mL | Freq: Two times a day (BID) | INTRAVENOUS | Status: DC
  Administered 2018-04-22 – 2018-04-25 (×7): 3 mL via INTRAVENOUS

## 2018-04-22 MED ORDER — MUSCLE RUB 10-15 % EX CREA
1.0000 "application " | TOPICAL_CREAM | CUTANEOUS | Status: DC | PRN
  Administered 2018-04-22 (×2): 1 via TOPICAL
  Filled 2018-04-22: qty 85

## 2018-04-22 MED ORDER — SODIUM CHLORIDE 0.9% FLUSH
3.0000 mL | Freq: Two times a day (BID) | INTRAVENOUS | Status: DC
  Administered 2018-04-22 – 2018-04-23 (×4): 3 mL via INTRAVENOUS

## 2018-04-22 NOTE — Progress Notes (Signed)
Patient transferred to room 205. Report called to Bear RN on [place]. Patient transferred to and from wheelchair with no difficulties and was able to walk to the bathroom in room 205 to void. Patient's boyfriend and sister at bedside. Patient titrated to room air during shift.

## 2018-04-22 NOTE — Telephone Encounter (Signed)
Update- the patient is currently admitted as of today.

## 2018-04-22 NOTE — H&P (Signed)
PULMONARY CONSULT/ICU NOTE  PATIENT NAME: [name]    MR#:  [id number]  DATE OF BIRTH:  12-09-1940  DATE OF ADMISSION:  04/22/2018  PRIMARY CARE PHYSICIAN: Steele Sizer, MD   REQUESTING/REFERRING PHYSICIAN: Darel Hong, MD  CHIEF COMPLAINT:   Chief Complaint  Patient presents with  . Respiratory Distress   Shortness of breath and cough for 2 to 3 days, worsening this morning. HISTORY OF PRESENT ILLNESS:  [name]  is a 77 y.o. female with a known history of multiple medical problems as below.   The patient has had worsening shortness of breath, cough with thick sputum for the past 2 to 3 days.  She also complains of leg swelling, orthopnea and nocturnal dyspnea.   She is found respiratory distress, put on BiPAP. Chest x-ray ED show pulmonary edema.  She is given Lasix in the ED.  Patient was placed on biPAP and nitro infusion and transferred to ICU   PAST MEDICAL HISTORY:   Past Medical History:  Diagnosis Date  . Bronchitis   . Chronic combined systolic (congestive) and diastolic (congestive) heart failure ([place])    a. 02/2018 Echo: EF 45-50%, diff HK, triv AI, mod MR, mildly dil LA/RA, nl RV fxn, mod TR. PASP 40-96mmHg.  Marland Kitchen DJD (degenerative joint disease), cervical   . History of stress test    a. 01/2008 MV: EF 76%. Fair ex tol. No ischemia.  . Hyperlipidemia   . Hypertension   . Hypothyroid   . LBBB (left bundle branch block)   . Leg pain    a. 01/2018 ABI's wnl.  . Persistent atrial fibrillation ([place])    a. 01/2018 Event monitor: 45 runs of SVT, longes 14.5 sec. SVT felt to be Afib/flutter-->2% burden. Longest run of AF 4h 74m (CHA2DS2VASc = 4-->Xarelto).  . 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 CARDIOVERSION N/A 03/26/2018   Procedure: CARDIOVERSION;  Surgeon: Nelva Bush, MD;  Location: [facility] ORS;  Service:  Cardiovascular;  Laterality: N/A;  . ELECTROPHYSIOLOGY STUDY N/A 02/27/2012   Procedure: ELECTROPHYSIOLOGY STUDY;  Surgeon: Evans Lance, MD;  Location: [facility] C[facility] LAB;  Service: Cardiovascular;  Laterality: N/A;  . EPS and ablation for SVT  5/13   slow pathway ablation by Dr Lovena Le  . EYE SURGERY     surgery for detatched retina  . SUPRAVENTRICULAR TACHYCARDIA ABLATION N/A 02/27/2012   Procedure: SUPRAVENTRICULAR TACHYCARDIA ABLATION;  Surgeon: Evans Lance, MD;  Location: [facility] C[facility] LAB;  Service: Cardiovascular;  Laterality: N/A;    SOCIAL HISTORY:   Social History   Tobacco Use  . Smoking status: Never Smoker  . Smokeless tobacco: Never Used  Substance Use Topics  . Alcohol use: No    FAMILY HISTORY:   Family History  Problem Relation Age of Onset  . Alzheimer's disease Mother   . Cancer Father   . Stroke Father   . Cancer Paternal Aunt   . Thyroid disease Maternal Grandmother     DRUG ALLERGIES:   Allergies  Allergen Reactions  . Codeine Nausea And Vomiting  . Erythromycin Itching and Swelling  . Septra [Sulfamethoxazole-Trimethoprim] Swelling  . Penicillins Swelling and Rash    Has patient had a PCN reaction causing immediate rash, facial/tongue/throat swelling, SOB or lightheadedness with hypotension: Yes Has patient had a PCN reaction causing severe rash involving mucus membranes or skin necrosis:  No Has patient had a PCN reaction that required hospitalization: No Has patient had a PCN reaction occurring within the last 10 years: No If all of the above answers are "NO", then may proceed with Cephalosporin use.     REVIEW OF SYSTEMS:   Review of Systems  Constitutional: Positive for malaise/fatigue. Negative for chills and fever.  HENT: Negative for sore throat.   Eyes: Negative for blurred vision and double vision.  Respiratory: Positive for cough, sputum production and shortness of breath. Negative for hemoptysis, wheezing and stridor.   Cardiovascular:  Positive for orthopnea and leg swelling. Negative for chest pain and palpitations.  Gastrointestinal: Negative for abdominal pain, blood in stool, diarrhea, melena, nausea and vomiting.  Genitourinary: Negative for dysuria, flank pain and hematuria.  Musculoskeletal: Negative for back pain and joint pain.  Skin: Negative for rash.  Neurological: Negative for dizziness, sensory change, focal weakness, seizures, loss of consciousness, weakness and headaches.  Endo/Heme/Allergies: Negative for polydipsia.  Psychiatric/Behavioral: Negative for depression. The patient is not nervous/anxious.     MEDICATIONS AT HOME:   Prior to Admission medications   Medication Sig Start Date End Date Taking? Authorizing Provider  acetaminophen (TYLENOL) 650 MG CR tablet Take 1,300 mg by mouth every 8 (eight) hours as needed for pain.   Yes [provider]  amiodarone (PACERONE) 200 MG tablet Take 1 tablet (200 mg total) by mouth 2 (two) times daily. 04/04/18  Yes Gollan, Kathlene November, MD  atorvastatin (LIPITOR) 20 MG tablet TAKE 1 TABLET(20 MG) BY MOUTH DAILY AT 6 PM 04/06/18  Yes Sowles, Drue Stager, MD  busPIRone (BUSPAR) 5 MG tablet Take 1 tablet (5 mg total) by mouth 2 (two) times daily. 02/15/18  Yes Sowles, Drue Stager, MD  cholecalciferol (VITAMIN D) 1000 UNITS tablet Take 2,000 Units by mouth daily.   Yes [provider]  ezetimibe (ZETIA) 10 MG tablet Take 1 tablet (10 mg total) by mouth daily. 01/29/18  Yes Gollan, Kathlene November, MD  furosemide (LASIX) 20 MG tablet Take 1 tablet (20 mg total) by mouth daily. 03/28/18  Yes Henreitta Leber, MD  guaiFENesin (MUCINEX) 600 MG 12 hr tablet Take 1 tablet (600 mg total) by mouth 2 (two) times daily. 04/05/18  Yes Sowles, Drue Stager, MD  levothyroxine (SYNTHROID, LEVOTHROID) 137 MCG tablet TAKE 1 TABLET(137 MCG) BY MOUTH EVERY MORNING 04/06/18  Yes Sowles, Drue Stager, MD  metoprolol succinate (TOPROL-XL) 50 MG 24 hr tablet Take 1 tablet (50 mg total) by mouth 2 (two) times  daily. 04/04/18  Yes Gollan, Kathlene November, MD  Multiple Vitamin (MULITIVITAMIN WITH MINERALS) TABS Take 1 tablet by mouth daily.   Yes [provider]  potassium chloride (K-DUR) 10 MEQ tablet Take 1 tablet (10 mEq total) by mouth daily. 03/27/18 05/26/18 Yes Sainani, Belia Heman, MD  temazepam (RESTORIL) 15 MG capsule Take 1 capsule (15 mg total) by mouth at bedtime as needed for sleep. 04/05/18  Yes Sowles, Drue Stager, MD  valsartan (DIOVAN) 160 MG tablet Take 1 tablet (160 mg total) by mouth daily. 03/27/18 05/26/18 Yes Sainani, Belia Heman, MD  XARELTO 20 MG TABS tablet TAKE 1 TABLET BY MOUTH DAILY WITH SUPPER 03/27/18  Yes Wellington Hampshire, MD      VITAL SIGNS:  Blood pressure (!) 144/101, pulse 97, temperature 97.8 F (36.6 C), temperature source Axillary, resp. rate 20, height 5\' 6"  (1.676 m), weight 153 lb 3.5 oz (69.5 kg), SpO2 97 %.  PHYSICAL EXAMINATION:  Physical Exam  GENERAL:  77 y.o.-year-old patient  lying in the bed, on BIPAP. EYES: Pupils equal, round, reactive to light and accommodation. No scleral icterus. Extraocular muscles intact.  HEENT: Head atraumatic, normocephalic. Oropharynx and nasopharynx clear.  NECK:  Supple, no jugular venous distention. No thyroid enlargement, no tenderness.  LUNGS: Normal breath sounds bilaterally, no wheezing, bilateral basilar rales,no rhonchi or crepitation. No use of accessory muscles of respiration.  CARDIOVASCULAR: Irregular rate and rhythm. No murmurs, rubs, or gallops.  ABDOMEN: Soft, nontender, nondistended. Bowel sounds present. No organomegaly or mass.  EXTREMITIES: Bilateral leg trace edema no cyanosis, or clubbing.  NEUROLOGIC: Cranial nerves II through XII are intact. Muscle strength 5/5 in all extremities. Sensation intact. Gait not checked.  PSYCHIATRIC: The patient is alert and oriented x 3.  SKIN: No obvious rash, lesion, or ulcer.   LABORATORY PANEL:   CBC Recent Labs  Lab 04/22/18 0616  WBC 8.0  HGB 13.2  HCT 38.3  PLT 341    ------------------------------------------------------------------------------------------------------------------  Chemistries  Recent Labs  Lab 04/22/18 0616  NA 138  K 3.3*  CL 105  CO2 23  GLUCOSE 224*  BUN 19  CREATININE 0.95  CALCIUM 8.9  AST 34  ALT 29  ALKPHOS 64  BILITOT 0.7   ------------------------------------------------------------------------------------------------------------------  Cardiac Enzymes Recent Labs  Lab 04/22/18 0616  TROPONINI <0.03   ------------------------------------------------------------------------------------------------------------------  RADIOLOGY:  Dg Chest Port 1 View  Result Date: 04/22/2018 CLINICAL DATA:  Shortness of Breath EXAM: PORTABLE CHEST 1 VIEW COMPARISON:  March 25, 2018 FINDINGS: There are small pleural effusions bilaterally. There is patchy consolidation in the right base. There is mild atelectatic change in the left base. There is cardiomegaly with mild pulmonary venous hypertension. No adenopathy. There is aortic atherosclerosis. No evident bone lesions. IMPRESSION: Pulmonary vascular congestion. Small pleural effusions bilaterally. Patchy consolidation right base concerning for early pneumonia. Mild left base atelectasis. There is aortic atherosclerosis. Aortic Atherosclerosis (ICD10-I70.0). Electronically Signed   By: Lowella Grip III M.D.   On: 04/22/2018 07:01   I have Independently reviewed images of  CXR   on 04/22/2018 Interpretation: b/l infiltrates, RLL opacity-chronic    IMPRESSION AND PLAN:   Acute respiratory failure with hypoxia due to acute on chronic diastolic CHF EF 40 to 81%. Wean off  BiPAP Start CHF protocol, Lasix 20 mg IV twice daily, fluid restriction and monitor input and output.  History of chronic persistent A. fib.  Continue Xarelto and amiodarone.  No need for ABX or steroids Wean off nitro infusion Wean off oxygen  Cardiology consult  Corrin Parker, M.D.  [facility] Heckler  Pulmonary & Critical Care Medicine  Medical Director [place] Director [facility] Cardio-Pulmonary Department

## 2018-04-22 NOTE — Care Management (Signed)
Signed           Show:Clear all [x] Manual[] Template[] Copied  Added by: [x] Marshell Garfinkel, RN   [] Hover for details   [facility] consult received an will follow. Patient had cardioversion early in June 2019. She is on Chronic Xarelto per notes.  She is followed by Dr. Johnny Bridge. She has been referred to outpatient cardio rehab per cardiac rehab RN navigator note 03/26/18.

## 2018-04-22 NOTE — Telephone Encounter (Signed)
Pt is scheduled to come in today for EKG  Nothing else needed.

## 2018-04-22 NOTE — ED Notes (Signed)
XR at bedside

## 2018-04-22 NOTE — ED Provider Notes (Signed)
[facility] Emergency Department Provider Note  ____________________________________________   First MD Initiated Contact with Patient 04/22/18 0615     (approximate)  I have reviewed the triage vital signs and the nursing notes.   HISTORY  Chief Complaint Respiratory Distress   HPI [name] is a 77 y.o. female who comes to the emergency department via EMS with acute shortness of breath that woke her from sleep this morning.  She has a past medical history of nonischemic cardiomyopathy and congestive heart failure as well as atrial fibrillation.  She denies gaining weight although last night she began sleeping on 3 pillows.  Her shortness of breath is worse when lying flat and improved when sitting up.  When EMS arrived and noted she was saturating in the low 80s.  The initially placed her on nasal cannula however her symptoms persisted so they put her on CPAP which nearly resolved her shortness of breath.  She also had moderate severity aching chest pain.  Not sudden onset not maximal onset.  Not ripping or tearing.  She reports compliance with her medications.  03/21/18 Echo: Study Conclusions  - Left ventricle: The cavity size was normal. Wall thickness was   increased in a pattern of mild LVH. Systolic function was mildly   reduced. The estimated ejection fraction was in the range of 45%   to 50%. Diffuse hypokinesis.   Past Medical History:  Diagnosis Date  . Bronchitis   . Chronic combined systolic (congestive) and diastolic (congestive) heart failure ([place])    a. 02/2018 Echo: EF 45-50%, diff HK, triv AI, mod MR, mildly dil LA/RA, nl RV fxn, mod TR. PASP 40-67mmHg.  Marland Kitchen DJD (degenerative joint disease), cervical   . History of stress test    a. 01/2008 MV: EF 76%. Fair ex tol. No ischemia.  . Hyperlipidemia   . Hypertension   . Hypothyroid   . LBBB (left bundle branch block)   . Leg pain    a. 01/2018 ABI's wnl.  . Persistent atrial  fibrillation ([place])    a. 01/2018 Event monitor: 45 runs of SVT, longes 14.5 sec. SVT felt to be Afib/flutter-->2% burden. Longest run of AF 4h 27m (CHA2DS2VASc = 4-->Xarelto).  . 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 Pulmonary hypertension ([place]) 04/05/2018  . Chronic combined systolic (congestive) and diastolic (congestive) heart failure ([place]) 04/05/2018  . Persistent atrial fibrillation ([place]) 03/25/2018  . Paroxysmal sinus tachycardia ([place]) 01/29/2018  . Leg pain 01/04/2018  . PAD (peripheral artery disease) ([place]) 01/04/2018  . Carotid artery calcification, bilateral 12/19/2017  . Cervical radiculitis 12/19/2017  . Hyperglycemia 03/06/2016  . Hypothyroid 04/21/2015  . 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 Anxiety 04/21/2015  . Hyperlipidemia 04/21/2015  . Diuretic-induced hypokalemia 04/21/2015  . Palpitations 12/18/2012  . SVT (supraventricular tachycardia) ([place]) 05/27/2012  . Hypertension 05/27/2012    Past Surgical History:  Procedure Laterality Date  . CARDIOVERSION N/A 03/26/2018   Procedure: CARDIOVERSION;  Surgeon: Nelva Bush, MD;  Location: [facility] ORS;  Service: Cardiovascular;  Laterality: N/A;  . ELECTROPHYSIOLOGY STUDY N/A 02/27/2012   Procedure: ELECTROPHYSIOLOGY STUDY;  Surgeon: Evans Lance, MD;  Location: [facility] CATH LAB;  Service: Cardiovascular;  Laterality: N/A;  . EPS and ablation for SVT  5/13   slow pathway ablation by Dr Lovena Le  . EYE SURGERY  surgery for detatched retina  . SUPRAVENTRICULAR TACHYCARDIA ABLATION N/A 02/27/2012   Procedure: SUPRAVENTRICULAR TACHYCARDIA ABLATION;  Surgeon: Evans Lance, MD;  Location: [facility] CATH LAB;  Service: Cardiovascular;  Laterality: N/A;    Prior to Admission medications   Medication Sig Start Date End Date Taking? Authorizing Provider  acetaminophen (TYLENOL) 650 MG CR tablet Take 1,300  mg by mouth every 8 (eight) hours as needed for pain.   Yes [provider]  amiodarone (PACERONE) 200 MG tablet Take 1 tablet (200 mg total) by mouth 2 (two) times daily. 04/04/18  Yes Gollan, Kathlene November, MD  atorvastatin (LIPITOR) 20 MG tablet TAKE 1 TABLET(20 MG) BY MOUTH DAILY AT 6 PM 04/06/18  Yes Sowles, Drue Stager, MD  busPIRone (BUSPAR) 5 MG tablet Take 1 tablet (5 mg total) by mouth 2 (two) times daily. 02/15/18  Yes Sowles, Drue Stager, MD  cholecalciferol (VITAMIN D) 1000 UNITS tablet Take 2,000 Units by mouth daily.   Yes [provider]  ezetimibe (ZETIA) 10 MG tablet Take 1 tablet (10 mg total) by mouth daily. 01/29/18  Yes Gollan, Kathlene November, MD  furosemide (LASIX) 20 MG tablet Take 1 tablet (20 mg total) by mouth daily. 03/28/18  Yes Henreitta Leber, MD  guaiFENesin (MUCINEX) 600 MG 12 hr tablet Take 1 tablet (600 mg total) by mouth 2 (two) times daily. 04/05/18  Yes Sowles, Drue Stager, MD  levothyroxine (SYNTHROID, LEVOTHROID) 137 MCG tablet TAKE 1 TABLET(137 MCG) BY MOUTH EVERY MORNING 04/06/18  Yes Sowles, Drue Stager, MD  metoprolol succinate (TOPROL-XL) 50 MG 24 hr tablet Take 1 tablet (50 mg total) by mouth 2 (two) times daily. 04/04/18  Yes Gollan, Kathlene November, MD  Multiple Vitamin (MULITIVITAMIN WITH MINERALS) TABS Take 1 tablet by mouth daily.   Yes [provider]  potassium chloride (K-DUR) 10 MEQ tablet Take 1 tablet (10 mEq total) by mouth daily. 03/27/18 05/26/18 Yes Sainani, Belia Heman, MD  temazepam (RESTORIL) 15 MG capsule Take 1 capsule (15 mg total) by mouth at bedtime as needed for sleep. 04/05/18  Yes Sowles, Drue Stager, MD  valsartan (DIOVAN) 160 MG tablet Take 1 tablet (160 mg total) by mouth daily. 03/27/18 05/26/18 Yes Sainani, Belia Heman, MD  XARELTO 20 MG TABS tablet TAKE 1 TABLET BY MOUTH DAILY WITH SUPPER 03/27/18  Yes Wellington Hampshire, MD    Allergies Codeine; Erythromycin; Septra [sulfamethoxazole-trimethoprim]; and Penicillins  Family History  Problem Relation Age of  Onset  . Alzheimer's disease Mother   . Cancer Father   . Stroke Father   . Cancer Paternal Aunt   . Thyroid disease Maternal Grandmother     Social History Social History   Tobacco Use  . Smoking status: Never Smoker  . Smokeless tobacco: Never Used  Substance Use Topics  . Alcohol use: No  . Drug use: No    Review of Systems Constitutional: No fever/chills Eyes: No visual changes. ENT: No sore throat. Cardiovascular: Positive for chest pain. Respiratory: Positive for shortness of breath. Gastrointestinal: No abdominal pain.  No nausea, no vomiting.  No diarrhea.  No constipation. Genitourinary: Negative for dysuria. Musculoskeletal: Negative for back pain. Skin: Negative for rash. Neurological: Negative for headaches, focal weakness or numbness.   ____________________________________________   PHYSICAL EXAM:  VITAL SIGNS: ED Triage Vitals  Enc Vitals Group     BP      Pulse      Resp      Temp      Temp src  SpO2      Weight      Height      Head Circumference      Peak Flow      Pain Score      Pain Loc      Pain Edu?      Excl. in [place]?     Constitutional: Severe respiratory distress Eyes: PERRL EOMI. Head: Atraumatic. Nose: No congestion/rhinnorhea. Mouth/Throat: No trismus Neck: No stridor.   Cardiovascular: Tachycardic irregularly irregular Respiratory: Severe respiratory distress on BiPAP.  Crackles throughout.  Using accessory muscles Gastrointestinal: Soft nontender Musculoskeletal: 1+ pitting edema to midshin bilaterally.  Legs are equal in size Neurologic:  Normal speech and language. No gross focal neurologic deficits are appreciated. Skin:  Skin is warm, dry and intact. No rash noted. Psychiatric: Anxious appearing    ____________________________________________   DIFFERENTIAL includes but not limited to  Acute pulmonary edema, CHF exacerbation, aortic dissection, acute coronary syndrome,  pneumothorax ____________________________________________   LABS (all labs ordered are listed, but only abnormal results are displayed)  Labs Reviewed  CBC WITH DIFFERENTIAL/PLATELET - Abnormal; Notable for the following components:      Result Value   RDW 15.6 (*)    All other components within normal limits  COMPREHENSIVE METABOLIC PANEL  BRAIN NATRIURETIC PEPTIDE  TROPONIN I    Lab work reviewed by me with no signs of acute ischemia __________________________________________  EKG  ED ECG REPORT I, Darel Hong, the attending physician, personally viewed and interpreted this ECG.  Date: 04/22/2018 EKG Time:  Rate: 104 Rhythm: Atrial fibrillation with rapid ventricular response QRS Axis: Leftward axis Intervals: normal ST/T Wave abnormalities: normal Narrative Interpretation: Left bundle branch block consistent with old.  No signs of acute ischemia  ____________________________________________  RADIOLOGY  Chest x-ray reviewed by me shows pulmonary congestion and right-sided pleural effusion ____________________________________________   PROCEDURES  Procedure(s) performed: no  .Critical Care Performed by: Darel Hong, MD Authorized by: Darel Hong, MD   Critical care provider statement:    Critical care time (minutes):  30   Critical care time was exclusive of:  Separately billable procedures and treating other patients   Critical care was necessary to treat or prevent imminent or life-threatening deterioration of the following conditions:  Respiratory failure   Critical care was time spent personally by me on the following activities:  Development of treatment plan with patient or surrogate, discussions with consultants, evaluation of patient's response to treatment, examination of patient, obtaining history from patient or surrogate, ordering and performing treatments and interventions, ordering and review of laboratory studies, ordering and review of  radiographic studies, pulse oximetry, re-evaluation of patient's condition and review of old charts    Critical Care performed: Yes  ____________________________________________   INITIAL IMPRESSION / ASSESSMENT AND PLAN / ED COURSE  Pertinent labs & imaging results that were available during my care of the patient were reviewed by me and considered in my medical decision making (see chart for details).   The patient arrives extremely short of breath unable to tolerate being off BiPAP.  Her clinical picture is most consistent with acute pulmonary edema from acute CHF exacerbation.  We will continue the BiPAP start a nitroglycerin drip and reevaluate.  ----------------------------------------- 7:24 AM on 04/22/2018 -----------------------------------------  The patient is significantly improved on BiPAP..  I have begun IV diuresis now.  At this point the patient requires inpatient admission for continued management of her acute hypoxic respiratory failure.  I spoke with the hospitalist Dr. Marcille Blanco  who has graciously agreed to admit the patient to his service.      ____________________________________________   FINAL CLINICAL IMPRESSION(S) / ED DIAGNOSES  Final diagnoses:  Acute on chronic systolic congestive heart failure (HCC)  Acute respiratory failure with hypoxia (HCC)      NEW MEDICATIONS STARTED DURING THIS VISIT:  New Prescriptions   No medications on file     Note:  This document was prepared using Dragon voice recognition software and may include unintentional dictation errors.     Darel Hong, MD 04/22/18 [phone]

## 2018-04-22 NOTE — ED Notes (Signed)
Pt repositioned in bed for comfort. External urinary catheter applied due to patient complaints of having to urinate, pt is alert and oriented, remains on BiPap. Will continue to monitor.

## 2018-04-22 NOTE — Consult Note (Signed)
Cardiology Consultation:   Patient ID: [name]; [id number]; 1941-03-04   Admit date: 04/22/2018 Date of Consult: 04/22/2018  Primary Care Provider: Steele Sizer, MD Primary Cardiologist: [name]   Patient Profile:   [name] is a 76 y.o. female with a hx of AVNRT status post catheter ablation in 2013, SVT, recently diagnosed persistent Afib on Xarelto s/p DCCV 6/4, LBBB, mild carotid artery stenosis with carotid ultrasound on 01/2018 showing no significant stenosis, hypertension, hyperlipidemia, leg pain with normal ABIs in 01/2018, and DJD who is being seen today for the evaluation of acute on chronic combined CHF, persistent A. fib with RVR at the request of [name].  History of Present Illness:   [name] has a long history of tachypalpitations that usually resolve with sitting after several minutes.  Patient was seen PCP in 11/2017 with twelve-lead EKG at that time showing periods of irregular rhythm as well as NSR with PVCs, fusion beats, LBBB, heart rate 90 bpm, unable to exclude PAF.  She was started on Xarelto.  She was referred to cardiology and was seen by Dr. Rockey Gonzales on 01/29/2018 with EKG at that visit showing NSR, 69 bpm, LBBB.  At that time, most recent labs from 08/2017 showed a normal TSH.  No recent BMP.  CBC from 04/2017 was unremarkable.  She underwent outpatient cardiac monitoring that showed normal sinus rhythm with a minimum heart rate of 30 bpm, maximum heart rate 179 bpm, and an average heart rate of 66 bpm.  Bundle branch block was noted.  She was noted to have 45 runs of SVT with the fastest interval lasting 13 beats with a max rate of 179 bpm and the longest lasting 14.5 seconds with an average rate of 96 bpm.  It was felt that some episodes of SVT may be short bursts of A. fib/flutter.  She was noted to have an A. fib burden of less than 2% with heart rates ranging from 56 to 129 bpm with an average rate of 79 bpm.  The longest run of A. fib was 4 hours and  24 minutes with an average rate of 79 bpm.  Mobitz type I was noted.  Some of the patient detected events corresponded with A. fib.  She was seen in the office on 03/04/2018 with a BP of 164/80, heart rate 138 bpm.  She was noted to be in A. fib with RVR.  Her Toprol-XL was increased to 50 mg twice daily, amlodipine was stopped and she was started on Cardizem CD 120 mg daily.  RN visit on 5/16 showed continued A. fib with RVR with heart rate of 116 bpm.  At that time, her Cardizem CD 120 mg was increased to twice daily.  RN visit on 5/22 showed A. fib, 83 bpm.  No changes were made.  Patient called our office on 5/29 noting increased lower extremity swelling and orthopnea with associated weight gain of 7 pounds over the prior week.  Labs drawn on 5/30 showed an unremarkable CBC, BNP 336, potassium 3.5, serum creatinine 0.78, glucose 145, sodium 139.  TTE on 03/21/2018 showed EF 45-50%, diffuse HK, trivial AI, mildly calcified mitral annulus with moderate mitral regurgitation directed posteriorly, mildly dilated left atrium, RVSF normal, PASP 40-45 mmHg, elevated CVP, trivial pericardial effusion. EKG at that visit showed Afib, 78 bpm, LBBB.  And follow-up on 5/31 she remained in A. fib and was volume overloaded.  She prefer to avoid Lasix at that time and was  continued on HCTZ.  Unfortunately, her shortness of breath worsened prompting hospital admission on 6/3 for volume overload and A. fib with RVR.  She was IV diuresed and underwent successful DCCV on 03/26/2018.  Discharge weight of 152 pounds.  At that time, her Toprol-XL was held given mildly bradycardic heart rates.  She was continued on amiodarone, Lipitor, Zetia, Lasix 20 mg daily, valsartan, and Xarelto.  She was seen in hospital follow-up on 04/04/2018 with a weight of 154 pounds.  She was noted to be back in atrial fibrillation at that time with a ventricular rate of 95 bpm.  Her metoprolol was increased and she was advised to stay on amiodarone 200 mg twice  daily with plans for possible repeat cardioversion following a longer load of amiodarone.  Unfortunately, the patient continued to notice worsening shortness of breath and tachypalpitations with associated 3 pillow orthopnea (baseline 2 pillow), abdominal distention, and lower extremity swelling.  She reports compliance with all medications including Xarelto and Eliquis.  She does not add salt to foods and eats out at restaurants approximately 1 time per week.  She drinks approximately 2 L of fluids daily.  Weight has been slowly increasing at home, gaining 1 to just under 2 pounds on a nightly basis with a maximum weight prior to admission of 155 pounds at home.  Because of her worsening shortness of breath, and increasing weight she presented to [facility] where she was noted to be in A. fib with RVR with heart rates in the 1 teens to 120s bpm and volume overloaded with chest x-ray showing pulmonary edema.  She was initially placed on BiPAP though has subsequently been transitioned to supplemental oxygen via nasal cannula.  BNP mildly elevated at 614, troponin negative x1, sodium 138, potassium 3.3, glucose 224, BUN 19, serum creatinine 0.95, LFT normal, WBC 8.0, Hgb 13.2, magnesium 2.2.  EKG showed A. fib with RVR, 104 bpm, LBBB (old).  She was started on IV Lasix.  No documented urine output however patient has approximately 500 mL's noted in catheter.  Continues to note shortness of breath along with palpitations.   Past Medical History:  Diagnosis Date  . Bronchitis   . Chronic combined systolic (congestive) and diastolic (congestive) heart failure ([place])    a. 02/2018 Echo: EF 45-50%, diff HK, triv AI, mod MR, mildly dil LA/RA, nl RV fxn, mod TR. PASP 40-66mmHg.  Marland Kitchen DJD (degenerative joint disease), cervical   . History of stress test    a. 01/2008 MV: EF 76%. Fair ex tol. No ischemia.  . Hyperlipidemia   . Hypertension   . Hypothyroid   . LBBB (left bundle branch block)   . Leg pain    a. 01/2018 ABI's  wnl.  . Persistent atrial fibrillation ([place])    a. 01/2018 Event monitor: 45 runs of SVT, longes 14.5 sec. SVT felt to be Afib/flutter-->2% burden. Longest run of AF 4h 69m (CHA2DS2VASc = 4-->Xarelto).  . SVT (supraventricular tachycardia) ([place])    a. AVNRT - s/p ablation by Dr Lovena Le 5/13    Past Surgical History:  Procedure Laterality Date  . CARDIOVERSION N/A 03/26/2018   Procedure: CARDIOVERSION;  Surgeon: Nelva Bush, MD;  Location: [facility] ORS;  Service: Cardiovascular;  Laterality: N/A;  . ELECTROPHYSIOLOGY STUDY N/A 02/27/2012   Procedure: ELECTROPHYSIOLOGY STUDY;  Surgeon: Evans Lance, MD;  Location: [facility] CATH LAB;  Service: Cardiovascular;  Laterality: N/A;  . EPS and ablation for SVT  5/13   slow pathway ablation by Dr  Lovena Le  . EYE SURGERY     surgery for detatched retina  . SUPRAVENTRICULAR TACHYCARDIA ABLATION N/A 02/27/2012   Procedure: SUPRAVENTRICULAR TACHYCARDIA ABLATION;  Surgeon: Evans Lance, MD;  Location: [facility] CATH LAB;  Service: Cardiovascular;  Laterality: N/A;     Home Meds: Prior to Admission medications   Medication Sig Start Date End Date Taking? Authorizing Provider  acetaminophen (TYLENOL) 650 MG CR tablet Take 1,300 mg by mouth every 8 (eight) hours as needed for pain.   Yes [provider]  amiodarone (PACERONE) 200 MG tablet Take 1 tablet (200 mg total) by mouth 2 (two) times daily. 04/04/18  Yes Gollan, Kathlene November, MD  atorvastatin (LIPITOR) 20 MG tablet TAKE 1 TABLET(20 MG) BY MOUTH DAILY AT 6 PM 04/06/18  Yes Sowles, Drue Stager, MD  busPIRone (BUSPAR) 5 MG tablet Take 1 tablet (5 mg total) by mouth 2 (two) times daily. 02/15/18  Yes Sowles, Drue Stager, MD  cholecalciferol (VITAMIN D) 1000 UNITS tablet Take 2,000 Units by mouth daily.   Yes [provider]  ezetimibe (ZETIA) 10 MG tablet Take 1 tablet (10 mg total) by mouth daily. 01/29/18  Yes Gollan, Kathlene November, MD  furosemide (LASIX) 20 MG tablet Take 1 tablet (20 mg total) by mouth daily. 03/28/18   Yes Henreitta Leber, MD  guaiFENesin (MUCINEX) 600 MG 12 hr tablet Take 1 tablet (600 mg total) by mouth 2 (two) times daily. 04/05/18  Yes Sowles, Drue Stager, MD  levothyroxine (SYNTHROID, LEVOTHROID) 137 MCG tablet TAKE 1 TABLET(137 MCG) BY MOUTH EVERY MORNING 04/06/18  Yes Sowles, Drue Stager, MD  metoprolol succinate (TOPROL-XL) 50 MG 24 hr tablet Take 1 tablet (50 mg total) by mouth 2 (two) times daily. 04/04/18  Yes Gollan, Kathlene November, MD  Multiple Vitamin (MULITIVITAMIN WITH MINERALS) TABS Take 1 tablet by mouth daily.   Yes [provider]  potassium chloride (K-DUR) 10 MEQ tablet Take 1 tablet (10 mEq total) by mouth daily. 03/27/18 05/26/18 Yes Sainani, Belia Heman, MD  temazepam (RESTORIL) 15 MG capsule Take 1 capsule (15 mg total) by mouth at bedtime as needed for sleep. 04/05/18  Yes Sowles, Drue Stager, MD  valsartan (DIOVAN) 160 MG tablet Take 1 tablet (160 mg total) by mouth daily. 03/27/18 05/26/18 Yes Sainani, Belia Heman, MD  XARELTO 20 MG TABS tablet TAKE 1 TABLET BY MOUTH DAILY WITH SUPPER 03/27/18  Yes Wellington Hampshire, MD    Inpatient Medications: Scheduled Meds: . amiodarone  400 mg Oral BID  . atorvastatin  20 mg Oral q1800  . busPIRone  5 mg Oral BID  . cholecalciferol  2,000 Units Oral Daily  . ezetimibe  10 mg Oral Daily  . furosemide  40 mg Intravenous Q12H  . guaiFENesin  600 mg Oral BID  . irbesartan  150 mg Oral Daily  . levothyroxine  137 mcg Oral QAC breakfast  . metoprolol tartrate  75 mg Oral TID  . [START ON 04/23/2018] potassium chloride  20 mEq Oral Daily  . rivaroxaban  20 mg Oral Q supper  . sodium chloride flush  3 mL Intravenous Q12H  . sodium chloride flush  3 mL Intravenous Q12H   Continuous Infusions: . sodium chloride     PRN Meds: sodium chloride, acetaminophen **OR** acetaminophen, albuterol, bisacodyl, lip balm, MUSCLE RUB, ondansetron **OR** ondansetron (ZOFRAN) IV, senna-docusate, sodium chloride flush, temazepam  Allergies:   Allergies  Allergen  Reactions  . Codeine Nausea And Vomiting  . Erythromycin Itching and Swelling  . Septra [Sulfamethoxazole-Trimethoprim] Swelling  .  Penicillins Swelling and Rash    Has patient had a PCN reaction causing immediate rash, facial/tongue/throat swelling, SOB or lightheadedness with hypotension: Yes Has patient had a PCN reaction causing severe rash involving mucus membranes or skin necrosis: No Has patient had a PCN reaction that required hospitalization: No Has patient had a PCN reaction occurring within the last 10 years: No If all of the above answers are "NO", then may proceed with Cephalosporin use.     Social History:   Social History   Socioeconomic History  . Marital status: Widowed    Spouse name: Not on file  . Number of children: Not on file  . Years of education: Not on file  . Highest education level: Not on file  Occupational History  . Not on file  Social Needs  . Financial resource strain: Not on file  . Food insecurity:    Worry: Not on file    Inability: Not on file  . Transportation needs:    Medical: Not on file    Non-medical: Not on file  Tobacco Use  . Smoking status: Never Smoker  . Smokeless tobacco: Never Used  Substance and Sexual Activity  . Alcohol use: No  . Drug use: No  . Sexual activity: Not on file  Lifestyle  . Physical activity:    Days per week: Not on file    Minutes per session: Not on file  . Stress: Not on file  Relationships  . Social connections:    Talks on phone: Not on file    Gets together: Not on file    Attends religious service: Not on file    Active member of club or organization: Not on file    Attends meetings of clubs or organizations: Not on file    Relationship status: Not on file  . Intimate partner violence:    Fear of current or ex partner: Not on file    Emotionally abused: Not on file    Physically abused: Not on file    Forced sexual activity: Not on file  Other Topics Concern  . Not on file  Social  History Narrative   Lives in [place]     Family History:   Family History  Problem Relation Age of Onset  . Alzheimer's disease Mother   . Cancer Father   . Stroke Father   . Cancer Paternal Aunt   . Thyroid disease Maternal Grandmother     ROS:  Review of Systems  Constitutional: Positive for malaise/fatigue. Negative for chills, diaphoresis, fever and weight loss.  HENT: Negative for congestion.   Eyes: Negative for discharge and redness.  Respiratory: Positive for shortness of breath. Negative for cough, hemoptysis, sputum production and wheezing.   Cardiovascular: Positive for palpitations, orthopnea and leg swelling. Negative for chest pain, claudication and PND.  Gastrointestinal: Negative for abdominal pain, blood in stool, heartburn, melena, nausea and vomiting.  Genitourinary: Negative for hematuria.  Musculoskeletal: Negative for falls and myalgias.  Skin: Negative for rash.  Neurological: Positive for weakness. Negative for dizziness, tingling, tremors, sensory change, speech change, focal weakness and loss of consciousness.  Endo/Heme/Allergies: Does not bruise/bleed easily.  Psychiatric/Behavioral: Negative for substance abuse. The patient is not nervous/anxious.   All other systems reviewed and are negative.     Physical Exam/Data:   Vitals:   04/22/18 1100 04/22/18 1200 04/22/18 1230 04/22/18 1300  BP: (!) 152/95 122/87  139/86  Pulse: (!) 101 (!) 125 88 92  Resp: Marland Kitchen)  24 (!) 25 20 (!) 24  Temp:   (!) 96.8 F (36 C)   TempSrc:   Axillary   SpO2: 98% 96% 99% 94%  Weight:      Height:        Intake/Output Summary (Last 24 hours) at 04/22/2018 1349 Last data filed at 04/22/2018 1200 Gross per 24 hour  Intake 484.05 ml  Output -  Net 484.05 ml   Filed Weights   04/22/18 0621 04/22/18 0825  Weight: 155 lb 6.8 oz (70.5 kg) 153 lb 3.5 oz (69.5 kg)   Body mass index is 24.73 kg/m.   Physical Exam: General: Well developed, well nourished, in no acute  distress. Head: Normocephalic, atraumatic, sclera non-icteric, no xanthomas, nares without discharge.  Neck: Negative for carotid bruits. JVD not elevated. Lungs: Diminished breath sounds bilaterally with crackles along the bilateral bases.  Breathing is unlabored. Heart: Tachycardic, irregularly irregular with S1 S2. II/VI systolic murmur at the apex, no rubs, or gallops appreciated. Abdomen: Soft, non-tender, distended with normoactive bowel sounds. No hepatomegaly. No rebound/guarding. No obvious abdominal masses. Msk:  Strength and tone appear normal for age. Extremities: No clubbing or cyanosis.  Trace pretibial edema bilaterally. Distal pedal pulses are 2+ and equal bilaterally. Neuro: Alert and oriented X 3. No facial asymmetry. No focal deficit. Moves all extremities spontaneously. Psych:  Responds to questions appropriately with a normal affect.   EKG:  The EKG was personally reviewed and demonstrates: A. fib with RVR, 104 bpm, LBBB (old) Telemetry:  Telemetry was personally reviewed and demonstrates: A. fib with RVR, 1 teens bpm, LBBB  Weights: [facility] Weights   04/22/18 0621 04/22/18 0825  Weight: 155 lb 6.8 oz (70.5 kg) 153 lb 3.5 oz (69.5 kg)    Relevant CV Studies: Echo 03/21/2018: Study Conclusions  - Left ventricle: The cavity size was normal. Wall thickness was   increased in a pattern of mild LVH. Systolic function was mildly   reduced. The estimated ejection fraction was in the range of 45%   to 50%. Diffuse hypokinesis. - Aortic valve: There was trivial regurgitation. - Mitral valve: Mildly calcified annulus. There was moderate   regurgitation directed posteriorly. - Left atrium: The atrium was mildly dilated. - Right ventricle: The cavity size was normal. Systolic function   was normal. - Right atrium: The atrium was mildly dilated. - Tricuspid valve: There was moderate regurgitation. - Pulmonary arteries: Systolic pressure was mildly to moderately    increased, in the range of 40 mm Hg to 45 mm Hg. - Inferior vena cava: The vessel was normal in size. The   respirophasic diameter changes were blunted (< 50%), consistent   with elevated central venous pressure. - Pericardium, extracardiac: A trivial pericardial effusion was   identified.  Laboratory Data:  Chemistry Recent Labs  Lab 04/22/18 0616  NA 138  K 3.3*  CL 105  CO2 23  GLUCOSE 224*  BUN 19  CREATININE 0.95  CALCIUM 8.9  GFRNONAA 57*  GFRAA >60  ANIONGAP 10    Recent Labs  Lab 04/22/18 0616  PROT 6.9  ALBUMIN 4.4  AST 34  ALT 29  ALKPHOS 64  BILITOT 0.7   Hematology Recent Labs  Lab 04/22/18 0616  WBC 8.0  RBC 4.22  HGB 13.2  HCT 38.3  MCV 90.6  MCH 31.2  MCHC 34.4  RDW 15.6*  PLT 341   Cardiac Enzymes Recent Labs  Lab 04/22/18 0616  TROPONINI <0.03   No results for input(s): TROPIPOC  in the last 168 hours.  BNP Recent Labs  Lab 04/22/18 0616  BNP 614.0*    DDimer No results for input(s): DDIMER in the last 168 hours.  Radiology/Studies:  Dg Chest Port 1 View  Result Date: 04/22/2018 IMPRESSION: Pulmonary vascular congestion. Small pleural effusions bilaterally. Patchy consolidation right base concerning for early pneumonia. Mild left base atelectasis. There is aortic atherosclerosis. Aortic Atherosclerosis (ICD10-I70.0). Electronically Signed   By: Lowella Grip III M.D.   On: 04/22/2018 07:01    Assessment and Plan:   1.  Acute respiratory distress with hypoxia: -Likely multifactorial including acute on chronic combined CHF in the setting of persistent A. fib with RVR -Initially required BiPAP, now on supplemental oxygen via nasal cannula, continue to wean as able -Supportive care  2.  Persistent A. fib with RVR: -Status post briefly successful DCCV on 6/4.  Patient indicates she was back in A. fib approximately 6/7 to 6/8 -She has been compliant with all medications including full dose anticoagulation -Recommend reloading  with amiodarone for a longer time.  As well as changing her metoprolol succinate to metoprolol tartrate 75 mg 3 times daily for added rate control.  Increase amiodarone to 400 mg twice daily for now. -Continue to diurese as below -Following diuresis, if she does not spontaneously convert to sinus rhythm on her own, would plan for repeat DCCV -Continue Xarelto  3.  Acute on chronic combined CHF: -She remains volume overloaded -Increase IV Lasix to 40 mg twice daily with KCl repletion -Strict I's and O's with daily weights -Metoprolol as above -Continue irbesartan -Cardiomyopathy possibly in the setting of persistent A. fib with RVR, plan to repeat limited echocardiogram following sinus rhythm as an outpatient  4.  Hypertension: -Blood pressure has been running elevated as an outpatient into the 751W systolic recently -Blood pressure remains elevated as an inpatient -Escalate metoprolol and Lasix therapy as above  5.  Hypokalemia: -Replete to goal greater than 4.0   For questions or updates, please contact [place] Please consult [email or web address] for contact info under Cardiology/STEMI.   Signed, Christell Faith, PA-C [place] Pager: [phone] 04/22/2018, 1:49 PM

## 2018-04-22 NOTE — Care Management (Deleted)
RNCM consult received an will follow. Patient had cardioversion early in June 2019. She is on Chronic Xarelto per notes.  She is followed by Dr. Johnny Bridge. She has been referred to outpatient cardio rehab per cardiac rehab RN navigator note 03/26/18.

## 2018-04-22 NOTE — H&P (Addendum)
[facility] [place] NAME: [name]    MR#:  [id number]  DATE OF BIRTH:  1940/12/23  DATE OF ADMISSION:  04/22/2018  PRIMARY CARE PHYSICIAN: Steele Sizer, MD   REQUESTING/REFERRING PHYSICIAN: Darel Hong, MD  CHIEF COMPLAINT:   Chief Complaint  Patient presents with  . Respiratory Distress   Shortness of breath and cough for 2 to 3 days, worsening this morning. HISTORY OF PRESENT ILLNESS:  [name]  is a 77 y.o. female with a known history of multiple medical problems as below.  The patient has had worsening shortness of breath, cough with thick sputum for the past 2 to 3 days.  She also complains of leg swelling, orthopnea and nocturnal dyspnea.  She is found respiratory distress, put on BiPAP. Chest x-ray ED show pulmonary edema.  She is given Lasix in the ED.  PAST MEDICAL HISTORY:   Past Medical History:  Diagnosis Date  . Bronchitis   . Chronic combined systolic (congestive) and diastolic (congestive) heart failure ([place])    a. 02/2018 Echo: EF 45-50%, diff HK, triv AI, mod MR, mildly dil LA/RA, nl RV fxn, mod TR. PASP 40-76mmHg.  Marland Kitchen DJD (degenerative joint disease), cervical   . History of stress test    a. 01/2008 MV: EF 76%. Fair ex tol. No ischemia.  . Hyperlipidemia   . Hypertension   . Hypothyroid   . LBBB (left bundle branch block)   . Leg pain    a. 01/2018 ABI's wnl.  . Persistent atrial fibrillation ([place])    a. 01/2018 Event monitor: 45 runs of SVT, longes 14.5 sec. SVT felt to be Afib/flutter-->2% burden. Longest run of AF 4h 26m (CHA2DS2VASc = 4-->Xarelto).  . 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 CARDIOVERSION N/A 03/26/2018   Procedure: CARDIOVERSION;  Surgeon: Nelva Bush, MD;  Location: [facility] ORS;  Service: Cardiovascular;  Laterality: N/A;  . ELECTROPHYSIOLOGY  STUDY N/A 02/27/2012   Procedure: ELECTROPHYSIOLOGY STUDY;  Surgeon: Evans Lance, MD;  Location: [facility] CATH LAB;  Service: Cardiovascular;  Laterality: N/A;  . EPS and ablation for SVT  5/13   slow pathway ablation by Dr Lovena Le  . EYE SURGERY     surgery for detatched retina  . SUPRAVENTRICULAR TACHYCARDIA ABLATION N/A 02/27/2012   Procedure: SUPRAVENTRICULAR TACHYCARDIA ABLATION;  Surgeon: Evans Lance, MD;  Location: [facility] C[facility] LAB;  Service: Cardiovascular;  Laterality: N/A;    SOCIAL HISTORY:   Social History   Tobacco Use  . Smoking status: Never Smoker  . Smokeless tobacco: Never Used  Substance Use Topics  . Alcohol use: No    FAMILY HISTORY:   Family History  Problem Relation Age of Onset  . Alzheimer's disease Mother   . Cancer Father   . Stroke Father   . Cancer Paternal Aunt   . Thyroid disease Maternal Grandmother     DRUG ALLERGIES:   Allergies  Allergen Reactions  . Codeine Nausea And Vomiting  . Erythromycin Itching and Swelling  . Septra [Sulfamethoxazole-Trimethoprim] Swelling  . Penicillins Swelling and Rash    Has patient had a PCN reaction causing immediate rash, facial/tongue/throat swelling, SOB or lightheadedness with hypotension: Yes Has patient had a PCN reaction causing severe rash involving mucus membranes or skin necrosis: No Has patient had a PCN reaction that required  hospitalization: No Has patient had a PCN reaction occurring within the last 10 years: No If all of the above answers are "NO", then may proceed with Cephalosporin use.     REVIEW OF SYSTEMS:   Review of Systems  Constitutional: Positive for malaise/fatigue. Negative for chills and fever.  HENT: Negative for sore throat.   Eyes: Negative for blurred vision and double vision.  Respiratory: Positive for cough, sputum production and shortness of breath. Negative for hemoptysis, wheezing and stridor.   Cardiovascular: Positive for orthopnea and leg swelling. Negative for chest  pain and palpitations.  Gastrointestinal: Negative for abdominal pain, blood in stool, diarrhea, melena, nausea and vomiting.  Genitourinary: Negative for dysuria, flank pain and hematuria.  Musculoskeletal: Negative for back pain and joint pain.  Skin: Negative for rash.  Neurological: Negative for dizziness, sensory change, focal weakness, seizures, loss of consciousness, weakness and headaches.  Endo/Heme/Allergies: Negative for polydipsia.  Psychiatric/Behavioral: Negative for depression. The patient is not nervous/anxious.     MEDICATIONS AT HOME:   Prior to Admission medications   Medication Sig Start Date End Date Taking? Authorizing Provider  acetaminophen (TYLENOL) 650 MG CR tablet Take 1,300 mg by mouth every 8 (eight) hours as needed for pain.   Yes [provider]  amiodarone (PACERONE) 200 MG tablet Take 1 tablet (200 mg total) by mouth 2 (two) times daily. 04/04/18  Yes Gollan, Kathlene November, MD  atorvastatin (LIPITOR) 20 MG tablet TAKE 1 TABLET(20 MG) BY MOUTH DAILY AT 6 PM 04/06/18  Yes Sowles, Drue Stager, MD  busPIRone (BUSPAR) 5 MG tablet Take 1 tablet (5 mg total) by mouth 2 (two) times daily. 02/15/18  Yes Sowles, Drue Stager, MD  cholecalciferol (VITAMIN D) 1000 UNITS tablet Take 2,000 Units by mouth daily.   Yes [provider]  ezetimibe (ZETIA) 10 MG tablet Take 1 tablet (10 mg total) by mouth daily. 01/29/18  Yes Gollan, Kathlene November, MD  furosemide (LASIX) 20 MG tablet Take 1 tablet (20 mg total) by mouth daily. 03/28/18  Yes Henreitta Leber, MD  guaiFENesin (MUCINEX) 600 MG 12 hr tablet Take 1 tablet (600 mg total) by mouth 2 (two) times daily. 04/05/18  Yes Sowles, Drue Stager, MD  levothyroxine (SYNTHROID, LEVOTHROID) 137 MCG tablet TAKE 1 TABLET(137 MCG) BY MOUTH EVERY MORNING 04/06/18  Yes Sowles, Drue Stager, MD  metoprolol succinate (TOPROL-XL) 50 MG 24 hr tablet Take 1 tablet (50 mg total) by mouth 2 (two) times daily. 04/04/18  Yes Gollan, Kathlene November, MD  Multiple Vitamin  (MULITIVITAMIN WITH MINERALS) TABS Take 1 tablet by mouth daily.   Yes [provider]  potassium chloride (K-DUR) 10 MEQ tablet Take 1 tablet (10 mEq total) by mouth daily. 03/27/18 05/26/18 Yes Sainani, Belia Heman, MD  temazepam (RESTORIL) 15 MG capsule Take 1 capsule (15 mg total) by mouth at bedtime as needed for sleep. 04/05/18  Yes Sowles, Drue Stager, MD  valsartan (DIOVAN) 160 MG tablet Take 1 tablet (160 mg total) by mouth daily. 03/27/18 05/26/18 Yes Sainani, Belia Heman, MD  XARELTO 20 MG TABS tablet TAKE 1 TABLET BY MOUTH DAILY WITH SUPPER 03/27/18  Yes Wellington Hampshire, MD      VITAL SIGNS:  Blood pressure (!) 146/97, pulse 93, temperature 97.6 F (36.4 C), temperature source Oral, resp. rate (!) 21, height 5\' 6"  (1.676 m), weight 155 lb 6.8 oz (70.5 kg), SpO2 98 %.  PHYSICAL EXAMINATION:  Physical Exam  GENERAL:  77 y.o.-year-old patient lying in the bed, on BIPAP. EYES: Pupils  equal, round, reactive to light and accommodation. No scleral icterus. Extraocular muscles intact.  HEENT: Head atraumatic, normocephalic. Oropharynx and nasopharynx clear.  NECK:  Supple, no jugular venous distention. No thyroid enlargement, no tenderness.  LUNGS: Normal breath sounds bilaterally, no wheezing, bilateral basilar rales,no rhonchi or crepitation. No use of accessory muscles of respiration.  CARDIOVASCULAR: Irregular rate and rhythm. No murmurs, rubs, or gallops.  ABDOMEN: Soft, nontender, nondistended. Bowel sounds present. No organomegaly or mass.  EXTREMITIES: Bilateral leg trace edema no cyanosis, or clubbing.  NEUROLOGIC: Cranial nerves II through XII are intact. Muscle strength 5/5 in all extremities. Sensation intact. Gait not checked.  PSYCHIATRIC: The patient is alert and oriented x 3.  SKIN: No obvious rash, lesion, or ulcer.   LABORATORY PANEL:   CBC Recent Labs  Lab 04/22/18 0616  WBC 8.0  HGB 13.2  HCT 38.3  PLT 341    ------------------------------------------------------------------------------------------------------------------  Chemistries  Recent Labs  Lab 04/22/18 0616  NA 138  K 3.3*  CL 105  CO2 23  GLUCOSE 224*  BUN 19  CREATININE 0.95  CALCIUM 8.9  AST 34  ALT 29  ALKPHOS 64  BILITOT 0.7   ------------------------------------------------------------------------------------------------------------------  Cardiac Enzymes Recent Labs  Lab 04/22/18 0616  TROPONINI <0.03   ------------------------------------------------------------------------------------------------------------------  RADIOLOGY:  Dg Chest Port 1 View  Result Date: 04/22/2018 CLINICAL DATA:  Shortness of Breath EXAM: PORTABLE CHEST 1 VIEW COMPARISON:  March 25, 2018 FINDINGS: There are small pleural effusions bilaterally. There is patchy consolidation in the right base. There is mild atelectatic change in the left base. There is cardiomegaly with mild pulmonary venous hypertension. No adenopathy. There is aortic atherosclerosis. No evident bone lesions. IMPRESSION: Pulmonary vascular congestion. Small pleural effusions bilaterally. Patchy consolidation right base concerning for early pneumonia. Mild left base atelectasis. There is aortic atherosclerosis. Aortic Atherosclerosis (ICD10-I70.0). Electronically Signed   By: Lowella Grip III M.D.   On: 04/22/2018 07:01      IMPRESSION AND PLAN:   Acute respiratory failure with hypoxia due to acute on chronic diastolic CHF EF 40 to 71%. The patient will be admitted to stepdown unit. Continue BiPAP, DuoNeb 6 hours and intensivist consult. Start CHF protocol, Lasix 20 mg IV twice daily, fluid restriction and monitor input and output.  History of chronic persistent A. fib.  Continue Xarelto and amiodarone.  Hypokalemia.  Give potassium supplement, follow-up potassium and magnesium level.  Hypertension.  Continue home hypertension medication. Informed  intensivist. All the records are reviewed and case discussed with ED provider. Management plans discussed with the patient, family and they are in agreement.  CODE STATUS: Full code  TOTAL CRITICAL TIME TAKING CARE OF THIS PATIENT: 56 minutes.    Demetrios Loll M.D on 04/22/2018 at 7:44 AM  Between 7am to 6pm - Pager - [phone]  After 6pm go to [email or web address] - [employer] [facility]  Office  [phone]  CC: Primary care physician; Steele Sizer, MD   Note: This dictation was prepared with Dragon dictation along with smaller phrase technology. Any transcriptional errors that result from this process are unin

## 2018-04-22 NOTE — ED Triage Notes (Signed)
Patient arrived from home co SOB, was dx with CHF and "was just told recently I got over it".  Patient is on CPAP from EMS and patient in NAD ao arrival.  Pt stated she had 7/10 CP and was given Nitro paste on left chest.  MD at bedside during triage.  Patient currently co of no CP.

## 2018-04-23 DIAGNOSIS — I5033 Acute on chronic diastolic (congestive) heart failure: Secondary | ICD-10-CM

## 2018-04-23 LAB — BASIC METABOLIC PANEL
Anion gap: 12 (ref 5–15)
BUN: 19 mg/dL (ref 8–23)
CALCIUM: 8.7 mg/dL — AB (ref 8.9–10.3)
CO2: 25 mmol/L (ref 22–32)
CREATININE: 0.86 mg/dL (ref 0.44–1.00)
Chloride: 99 mmol/L (ref 98–111)
GFR calc Af Amer: >60 mL/min (ref >60)
GFR calc non Af Amer: >60 mL/min (ref >60)
Glucose, Bld: 130 mg/dL — ABNORMAL HIGH (ref 70–99)
Potassium: 3.2 mmol/L — ABNORMAL LOW (ref 3.5–5.1)
Sodium: 136 mmol/L (ref 135–145)

## 2018-04-23 LAB — CBC
HCT: 37.1 % (ref 35.0–47.0)
Hemoglobin: 12.7 g/dL (ref 12.0–16.0)
MCH: 30.7 pg (ref 26.0–34.0)
MCHC: 34.1 g/dL (ref 32.0–36.0)
MCV: 90.1 fL (ref 80.0–100.0)
PLATELETS: 306 10*3/uL (ref 150–440)
RBC: 4.12 MIL/uL (ref 3.80–5.20)
RDW: 15.4 % — ABNORMAL HIGH (ref 11.5–14.5)
WBC: 9 10*3/uL (ref 3.6–11.0)

## 2018-04-23 MED ORDER — FUROSEMIDE 10 MG/ML IJ SOLN
60.0000 mg | Freq: Two times a day (BID) | INTRAMUSCULAR | Status: DC
  Administered 2018-04-23 – 2018-04-25 (×3): 60 mg via INTRAVENOUS
  Filled 2018-04-23 (×3): qty 8

## 2018-04-23 MED ORDER — POTASSIUM CHLORIDE CRYS ER 10 MEQ PO TBCR
20.0000 meq | EXTENDED_RELEASE_TABLET | Freq: Two times a day (BID) | ORAL | Status: DC
  Administered 2018-04-23 – 2018-04-25 (×4): 20 meq via ORAL
  Filled 2018-04-23 (×7): qty 2

## 2018-04-23 MED ORDER — POTASSIUM CHLORIDE CRYS ER 20 MEQ PO TBCR
40.0000 meq | EXTENDED_RELEASE_TABLET | Freq: Once | ORAL | Status: AC
  Administered 2018-04-23: 40 meq via ORAL
  Filled 2018-04-23: qty 2

## 2018-04-23 NOTE — Progress Notes (Signed)
Progress Note  Patient Name: [name] Date of Encounter: 04/23/2018  Primary Cardiologist: Rockey Situ  Subjective   Transferred out of the ICU. Remains on supplemental oxygen. Reports her SOB is much improved. No weight this morning. No documentation of UOP. Renal function stable. Potassium low at 3.2. Remains on IV Lasix 40 mg q 12 hours. Remains in Afib with controlled ventricular response.   Inpatient Medications    Scheduled Meds: . amiodarone  400 mg Oral BID  . atorvastatin  20 mg Oral q1800  . busPIRone  5 mg Oral BID  . cholecalciferol  2,000 Units Oral Daily  . ezetimibe  10 mg Oral Daily  . furosemide  40 mg Intravenous Q12H  . guaiFENesin  600 mg Oral BID  . irbesartan  150 mg Oral Daily  . levothyroxine  137 mcg Oral QAC breakfast  . metoprolol tartrate  75 mg Oral TID  . potassium chloride  20 mEq Oral Daily  . potassium chloride  40 mEq Oral Once  . rivaroxaban  20 mg Oral Q supper  . sodium chloride flush  3 mL Intravenous Q12H  . sodium chloride flush  3 mL Intravenous Q12H   Continuous Infusions: . sodium chloride     PRN Meds: sodium chloride, acetaminophen **OR** acetaminophen, albuterol, bisacodyl, lip balm, MUSCLE RUB, ondansetron **OR** ondansetron (ZOFRAN) IV, senna-docusate, sodium chloride flush, temazepam   Vital Signs    Vitals:   04/22/18 1700 04/22/18 1830 04/22/18 2028 04/23/18 0625  BP: (!) 140/94 (!) 153/92 (!) 142/89 134/77  Pulse: 80 65 88 (!) 59  Resp: (!) 21 16 19 18   Temp:  97.7 F (36.5 C) (!) 96.6 F (35.9 C) (!) 97.2 F (36.2 C)  TempSrc:  Oral Axillary Axillary  SpO2: 94% 93% 94% 95%  Weight:      Height:        Intake/Output Summary (Last 24 hours) at 04/23/2018 1135 Last data filed at 04/23/2018 0900 Gross per 24 hour  Intake 720 ml  Output 1750 ml  Net -1030 ml   Filed Weights   04/22/18 0621 04/22/18 0825  Weight: 155 lb 6.8 oz (70.5 kg) 153 lb 3.5 oz (69.5 kg)    Telemetry    Afib, 80s to 90s bpm -  Personally Reviewed  ECG    n/a - Personally Reviewed  Physical Exam   GEN: No acute distress.   Neck: JVD elevated to the angle of the mandible. Cardiac: Irregularly irregular, no murmurs, rubs, or gallops.  Respiratory: Crackles along the bilateral bases.  GI: Soft, nontender, non-distended.   MS: Improved LE edema; No deformity. Neuro:  Alert and oriented x 3; Nonfocal.  Psych: Normal affect.  Labs    Chemistry Recent Labs  Lab 04/22/18 0616 04/23/18 0424  NA 138 136  K 3.3* 3.2*  CL 105 99  CO2 23 25  GLUCOSE 224* 130*  BUN 19 19  CREATININE 0.95 0.86  CALCIUM 8.9 8.7*  PROT 6.9  --   ALBUMIN 4.4  --   AST 34  --   ALT 29  --   ALKPHOS 64  --   BILITOT 0.7  --   GFRNONAA 57* >60  GFRAA >60 >60  ANIONGAP 10 12     Hematology Recent Labs  Lab 04/22/18 0616 04/23/18 0424  WBC 8.0 9.0  RBC 4.22 4.12  HGB 13.2 12.7  HCT 38.3 37.1  MCV 90.6 90.1  MCH 31.2 30.7  MCHC 34.4 34.1  RDW  15.6* 15.4*  PLT 341 306    Cardiac Enzymes Recent Labs  Lab 04/22/18 0616  TROPONINI <0.03   No results for input(s): TROPIPOC in the last 168 hours.   BNP Recent Labs  Lab 04/22/18 0616  BNP 614.0*     DDimer No results for input(s): DDIMER in the last 168 hours.   Radiology    Dg Chest Port 1 View  Result Date: 04/22/2018 IMPRESSION: Pulmonary vascular congestion. Small pleural effusions bilaterally. Patchy consolidation right base concerning for early pneumonia. Mild left base atelectasis. There is aortic atherosclerosis. Aortic Atherosclerosis (ICD10-I70.0). Electronically Signed   By: Lowella Grip III M.D.   On: 04/22/2018 07:01    Cardiac Studies   Echo 03/21/2018: Study Conclusions  - Left ventricle: The cavity size was normal. Wall thickness was increased in a pattern of mild LVH. Systolic function was mildly reduced. The estimated ejection fraction was in the range of 45% to 50%. Diffuse hypokinesis. - Aortic valve: There was  trivial regurgitation. - Mitral valve: Mildly calcified annulus. There was moderate regurgitation directed posteriorly. - Left atrium: The atrium was mildly dilated. - Right ventricle: The cavity size was normal. Systolic function was normal. - Right atrium: The atrium was mildly dilated. - Tricuspid valve: There was moderate regurgitation. - Pulmonary arteries: Systolic pressure was mildly to moderately increased, in the range of 40 mm Hg to 45 mm Hg. - Inferior vena cava: The vessel was normal in size. The respirophasic diameter changes were blunted (<50%), consistent with elevated central venous pressure. - Pericardium, extracardiac: A trivial pericardial effusion was identified.  Patient Profile     77 y.o. female with history of status post catheter ablation in 2013, SVT, recently diagnosed persistent Afib on Xarelto s/p DCCV 6/4,LBBB, mild carotid artery stenosis with carotid ultrasound on 01/2018 showing no significant stenosis, hypertension, hyperlipidemia, leg pain with normal ABIs in 01/2018, and DJD who is being seen today for the evaluation of acute on chronic combined CHF, persistent A. fib with RVR.  Assessment & Plan    1. Acute respiratory distress with hypoxia: -Likely multifactorial including acute on chronic combined CHF in the setting of persistent A. fib with RVR -Initially required BiPAP, now on supplemental oxygen via nasal cannula, continue to wean as able -Supportive care  2.  Persistent A. fib with RVR: -Status post briefly successful DCCV on 6/4.  Patient indicates she was back in A. fib approximately 6/7 to 6/8 -She has been compliant with all medications including full dose anticoagulation -Continue reloading with amiodarone for a longer time at 400 mg bid -Continue increased dose of metoprolol tartrate 75 mg 3 times daily for added rate control. Following DCCV, this can be changed back to Toprol XL -Continue to diurese as below -Plan for  DCCV on 7/3 -Continue Xarelto  3.  Acute on chronic combined CHF: -She remains volume overloaded -Increase IV Lasix to 60 mg twice daily with KCl repletion -Strict I's and O's with daily weights (orders placed), needs to be weighed on a standing scale today -Metoprolol as above -Continue irbesartan -Cardiomyopathy possibly in the setting of persistent A. fib with RVR, plan to repeat limited echocardiogram following sinus rhythm as an outpatient  4.  Hypertension: -Reasonably controlled -Continue current medications as above  5.  Hypokalemia: -Replete to goal greater than 4.0    For questions or updates, please contact [place] Please consult [email or web address] for contact info under Cardiology/STEMI.    Signed, Christell Faith, PA-C [facility] Pager: [phone])  [phone] 04/23/2018, 11:35 AM

## 2018-04-23 NOTE — Progress Notes (Signed)
[facility] [place] NAME: [name]    MR#:  [id number]  DATE OF BIRTH:  [birth year]  SUBJECTIVE:  CHIEF COMPLAINT:   Chief Complaint  Patient presents with  . Respiratory Distress   Much better shortness of breath and cough.  Off BiPAP and off oxygen. REVIEW OF SYSTEMS:  Review of Systems  Constitutional: Negative for chills, fever and malaise/fatigue.  HENT: Negative for sore throat.   Eyes: Negative for blurred vision and double vision.  Respiratory: Negative for cough, hemoptysis, shortness of breath, wheezing and stridor.   Cardiovascular: Negative for chest pain, palpitations, orthopnea and leg swelling.  Gastrointestinal: Negative for abdominal pain, blood in stool, diarrhea, melena, nausea and vomiting.  Genitourinary: Negative for dysuria, flank pain and hematuria.  Musculoskeletal: Negative for back pain and joint pain.  Skin: Negative for rash.  Neurological: Negative for dizziness, sensory change, focal weakness, seizures, loss of consciousness, weakness and headaches.  Endo/Heme/Allergies: Negative for polydipsia.  Psychiatric/Behavioral: Negative for depression. The patient is not nervous/anxious.     DRUG ALLERGIES:   Allergies  Allergen Reactions  . Codeine Nausea And Vomiting  . Erythromycin Itching and Swelling  . Septra [Sulfamethoxazole-Trimethoprim] Swelling  . Penicillins Swelling and Rash    Has patient had a PCN reaction causing immediate rash, facial/tongue/throat swelling, SOB or lightheadedness with hypotension: Yes Has patient had a PCN reaction causing severe rash involving mucus membranes or skin necrosis: No Has patient had a PCN reaction that required hospitalization: No Has patient had a PCN reaction occurring within the last 10 years: No If all of the above answers are "NO", then may proceed with Cephalosporin use.    VITALS:  Blood pressure 133/88, pulse 82, temperature (!) 97.1 F (36.2  C), temperature source Axillary, resp. rate 16, height 5\' 6"  (1.676 m), weight 151 lb 14.4 oz (68.9 kg), SpO2 97 %. PHYSICAL EXAMINATION:  Physical Exam  Constitutional: She is oriented to person, place, and time. She appears well-developed.  HENT:  Head: Normocephalic.  Mouth/Throat: Oropharynx is clear and moist.  Eyes: Pupils are equal, round, and reactive to light. Conjunctivae and EOM are normal. No scleral icterus.  Neck: Normal range of motion. Neck supple. No JVD present. No tracheal deviation present.  Cardiovascular: Normal rate, regular rhythm and normal heart sounds. Exam reveals no gallop.  No murmur heard. Pulmonary/Chest: Effort normal. No respiratory distress. She has no wheezes. She has rales.  Abdominal: Soft. Bowel sounds are normal. She exhibits no distension. There is no tenderness. There is no rebound.  Musculoskeletal: Normal range of motion. She exhibits no edema or tenderness.  Neurological: She is alert and oriented to person, place, and time. No cranial nerve deficit.  Skin: No rash noted. No erythema.   LABORATORY PANEL:  Female CBC Recent Labs  Lab 04/23/18 0424  WBC 9.0  HGB 12.7  HCT 37.1  PLT 306   ------------------------------------------------------------------------------------------------------------------ Chemistries  Recent Labs  Lab 04/22/18 0616 04/23/18 0424  NA 138 136  K 3.3* 3.2*  CL 105 99  CO2 23 25  GLUCOSE 224* 130*  BUN 19 19  CREATININE 0.95 0.86  CALCIUM 8.9 8.7*  MG 2.2  --   AST 34  --   ALT 29  --   ALKPHOS 64  --   BILITOT 0.7  --    RADIOLOGY:  No results found. ASSESSMENT AND PLAN:  Steele Ledonne  is a 77 y.o. female with a known history of  multiple medical problems has had worsening shortness of breath, cough with thick sputum for the past 2 to 3 days.  She also complains of leg swelling, orthopnea and nocturnal dyspnea.  She is found respiratory distress, put on BiPAP. Chest x-ray ED show pulmonary  edema.  Acute respiratory failure with hypoxia due to acute on chronic diastolic CHF EF 40 to 18%. She is off BiPAP and O2 [place].  Lasix increased to 60 mg IV twice daily per cardiologist, fluid restriction and monitor input and output.  Persistent A. Fib with RVR.   Per cardiology consult, reloading with amiodarone for a longer time at 400 mg bid, increased dose of metoprolol tartrate 75 mg 3 times daily, can be changed back to Toprol XL. Continue Xarelto. Cardioversion tomorrow.   Hypokalemia.    Continue potassium supplement, follow-up potassium.  Hypertension.  Continue home hypertension medication.  All the records are reviewed and case discussed with Care Management/Social Worker. Management plans discussed with the patient, family and they are in agreement.  CODE STATUS: Full Code  TOTAL TIME TAKING CARE OF THIS PATIENT: 35 minutes.   More than 50% of the time was spent in counseling/coordination of care: YES  POSSIBLE D/C IN 1-2 DAYS, DEPENDING ON CLINICAL CONDITION.   Demetrios Loll M.D on 04/23/2018 at 4:42 PM  Between 7am to 6pm - Pager - [phone]  After 6pm go to [email or web address] - [facility]

## 2018-04-24 ENCOUNTER — Inpatient Hospital Stay: Payer: Medicare Other

## 2018-04-24 ENCOUNTER — Encounter: Payer: Self-pay | Admitting: *Deleted

## 2018-04-24 ENCOUNTER — Encounter
Admission: EM | Disposition: A | Discharge status: Home / Self Care | Payer: Self-pay | Source: Home / Self Care | Attending: Internal Medicine

## 2018-04-24 ENCOUNTER — Telehealth: Payer: Self-pay | Admitting: Family Medicine

## 2018-04-24 DIAGNOSIS — J9601 Acute respiratory failure with hypoxia: Secondary | ICD-10-CM

## 2018-04-24 HISTORY — PX: CARDIOVERSION: EP1203

## 2018-04-24 LAB — CBC WITH DIFFERENTIAL/PLATELET
BASOS ABS: 0 10*3/uL (ref 0–0.1)
Basophils Relative: 0 %
EOS PCT: 1 %
Eosinophils Absolute: 0.1 10*3/uL (ref 0–0.7)
HCT: 38.9 % (ref 35.0–47.0)
Hemoglobin: 13.3 g/dL (ref 12.0–16.0)
LYMPHS PCT: 20 %
Lymphs Abs: 1.7 10*3/uL (ref 1.0–3.6)
MCH: 30.3 pg (ref 26.0–34.0)
MCHC: 34.1 g/dL (ref 32.0–36.0)
MCV: 88.9 fL (ref 80.0–100.0)
MONO ABS: 0.8 10*3/uL (ref 0.2–0.9)
MONOS PCT: 9 %
Neutro Abs: 6.2 10*3/uL (ref 1.4–6.5)
Neutrophils Relative %: 70 %
PLATELETS: 319 10*3/uL (ref 150–440)
RBC: 4.38 MIL/uL (ref 3.80–5.20)
RDW: 15.2 % — AB (ref 11.5–14.5)
WBC: 8.8 10*3/uL (ref 3.6–11.0)

## 2018-04-24 LAB — BASIC METABOLIC PANEL
Anion gap: 11 (ref 5–15)
BUN: 25 mg/dL — AB (ref 8–23)
CALCIUM: 9.2 mg/dL (ref 8.9–10.3)
CO2: 25 mmol/L (ref 22–32)
Chloride: 102 mmol/L (ref 98–111)
Creatinine, Ser: 1.05 mg/dL — ABNORMAL HIGH (ref 0.44–1.00)
GFR calc Af Amer: 59 mL/min — ABNORMAL LOW (ref >60)
GFR, EST NON AFRICAN AMERICAN: 51 mL/min — AB (ref >60)
GLUCOSE: 141 mg/dL — AB (ref 70–99)
POTASSIUM: 4.2 mmol/L (ref 3.5–5.1)
Sodium: 138 mmol/L (ref 135–145)

## 2018-04-24 SURGERY — CARDIOVERSION (CATH LAB)
Anesthesia: General

## 2018-04-24 MED ORDER — PROPOFOL 10 MG/ML IV BOLUS
INTRAVENOUS | Status: DC | PRN
  Administered 2018-04-24: 20 mg via INTRAVENOUS
  Administered 2018-04-24: 50 mg via INTRAVENOUS

## 2018-04-24 MED ORDER — AMIODARONE HCL 200 MG PO TABS
200.0000 mg | ORAL_TABLET | Freq: Two times a day (BID) | ORAL | Status: DC
  Administered 2018-04-24 – 2018-04-25 (×3): 200 mg via ORAL
  Filled 2018-04-24 (×4): qty 1

## 2018-04-24 MED ORDER — PROPOFOL 10 MG/ML IV BOLUS
INTRAVENOUS | Status: AC
  Filled 2018-04-24: qty 20

## 2018-04-24 MED ORDER — EPHEDRINE SULFATE 50 MG/ML IJ SOLN
INTRAMUSCULAR | Status: AC
  Filled 2018-04-24: qty 1

## 2018-04-24 MED ORDER — METOPROLOL TARTRATE 25 MG PO TABS
25.0000 mg | ORAL_TABLET | Freq: Two times a day (BID) | ORAL | Status: DC
  Administered 2018-04-24: 25 mg via ORAL
  Filled 2018-04-24: qty 1

## 2018-04-24 NOTE — CV Procedure (Signed)
Cardioversion procedure note For atrial fibrillation, persistent  Procedure Details:  Consent: Risks of procedure as well as the alternatives and risks of each were explained to the (patient/caregiver).  Consent for procedure obtained.  Time Out: Verified patient identification, verified procedure, site/side was marked, verified correct patient position, special equipment/implants available, medications/allergies/relevent history reviewed, required imaging and test results available.  Performed  Patient placed on cardiac monitor, pulse oximetry, supplemental oxygen as necessary.   Sedation given: propofol IV, Dr. Karenz Pacer pads placed anterior and posterior chest.   Cardioverted 1 time(s).   Cardioverted at  150 J. Synchronized biphasic Converted to NSR   Evaluation: Findings: Post procedure EKG shows: NSR Complications: None Patient did tolerate procedure well.  Time Spent Directly with the Patient:  45 minutes   Tim Gollan, M.D., Ph.D.  

## 2018-04-24 NOTE — Transfer of Care (Signed)
Immediate Anesthesia Transfer of Care Note  Patient: [name]  Procedure(s) Performed: CARDIOVERSION (N/A )  Patient Location: spu  Anesthesia Type:General  Level of Consciousness: awake  Airway & Oxygen Therapy: Patient Spontanous Breathing and Patient connected to nasal cannula oxygen  Post-op Assessment: Report given to RN and Post -op Vital signs reviewed and stable  Post vital signs: Reviewed  Last Vitals:  Vitals Value Taken Time  BP 112/65 04/24/2018  7:40 AM  Temp    Pulse 49 04/24/2018  7:41 AM  Resp 18 04/24/2018  7:41 AM  SpO2 97 % 04/24/2018  7:41 AM  Vitals shown include unvalidated device data.  Last Pain:  Vitals:   04/24/18 0716  TempSrc:   PainSc: 0-No pain      Patients Stated Pain Goal: 2 (12/50/87 1994)  Complications: No apparent anesthesia complications

## 2018-04-24 NOTE — Progress Notes (Signed)
[facility] NAME: [name]    MR#:  [id number]  DATE OF BIRTH:  09/30/1941  SUBJECTIVE:  CHIEF COMPLAINT:   Chief Complaint  Patient presents with  . Respiratory Distress   The patient has no complaints, status post cardioversion this morning.  Bradycardia at 40 to 50s. REVIEW OF SYSTEMS:  Review of Systems  Constitutional: Negative for chills, fever and malaise/fatigue.  HENT: Negative for sore throat.   Eyes: Negative for blurred vision and double vision.  Respiratory: Negative for cough, hemoptysis, shortness of breath, wheezing and stridor.   Cardiovascular: Negative for chest pain, palpitations, orthopnea and leg swelling.  Gastrointestinal: Negative for abdominal pain, blood in stool, diarrhea, melena, nausea and vomiting.  Genitourinary: Negative for dysuria, flank pain and hematuria.  Musculoskeletal: Negative for back pain and joint pain.  Skin: Negative for rash.  Neurological: Negative for dizziness, sensory change, focal weakness, seizures, loss of consciousness, weakness and headaches.  Endo/Heme/Allergies: Negative for polydipsia.  Psychiatric/Behavioral: Negative for depression. The patient is not nervous/anxious.     DRUG ALLERGIES:   Allergies  Allergen Reactions  . Codeine Nausea And Vomiting  . Erythromycin Itching and Swelling  . Septra [Sulfamethoxazole-Trimethoprim] Swelling  . Penicillins Swelling and Rash    Has patient had a PCN reaction causing immediate rash, facial/tongue/throat swelling, SOB or lightheadedness with hypotension: Yes Has patient had a PCN reaction causing severe rash involving mucus membranes or skin necrosis: No Has patient had a PCN reaction that required hospitalization: No Has patient had a PCN reaction occurring within the last 10 years: No If all of the above answers are "NO", then may proceed with Cephalosporin use.    VITALS:  Blood pressure (!) 124/57, pulse  (!) 48, temperature 97.7 F (36.5 C), temperature source Oral, resp. rate 16, height 5\' 6"  (1.676 m), weight 158 lb 4.6 oz (71.8 kg), SpO2 97 %. PHYSICAL EXAMINATION:  Physical Exam  Constitutional: She is oriented to person, place, and time. She appears well-developed.  HENT:  Head: Normocephalic.  Mouth/Throat: Oropharynx is clear and moist.  Eyes: Pupils are equal, round, and reactive to light. Conjunctivae and EOM are normal. No scleral icterus.  Neck: Normal range of motion. Neck supple. No JVD present. No tracheal deviation present.  Cardiovascular: Regular rhythm and normal heart sounds. Exam reveals no gallop.  No murmur heard. Bradycardia.  Pulmonary/Chest: Effort normal. No respiratory distress. She has no wheezes. She has no rales.  Abdominal: Soft. Bowel sounds are normal. She exhibits no distension. There is no tenderness. There is no rebound.  Musculoskeletal: Normal range of motion. She exhibits no edema or tenderness.  Neurological: She is alert and oriented to person, place, and time. No cranial nerve deficit.  Skin: No rash noted. No erythema.   LABORATORY PANEL:  Female CBC Recent Labs  Lab 04/24/18 0515  WBC 8.8  HGB 13.3  HCT 38.9  PLT 319   ------------------------------------------------------------------------------------------------------------------ Chemistries  Recent Labs  Lab 04/22/18 0616  04/24/18 0515  NA 138   < > 138  K 3.3*   < > 4.2  CL 105   < > 102  CO2 23   < > 25  GLUCOSE 224*   < > 141*  BUN 19   < > 25*  CREATININE 0.95   < > 1.05*  CALCIUM 8.9   < > 9.2  MG 2.2  --   --   AST 34  --   --  ALT 29  --   --   ALKPHOS 64  --   --   BILITOT 0.7  --   --    < > = values in this interval not displayed.   RADIOLOGY:  No results found. ASSESSMENT AND PLAN:  [name]  is a 77 y.o. female with a known history of multiple medical problems has had worsening shortness of breath, cough with thick sputum for the past 2 to 3 days.   She also complains of leg swelling, orthopnea and nocturnal dyspnea.  She is found respiratory distress, put on BiPAP. Chest x-ray ED show pulmonary edema.  Acute respiratory failure with hypoxia due to acute on chronic diastolic CHF EF 40 to 20%. She is off BiPAP and O2 [name].  Lasix increased to 60 mg IV twice daily per cardiologist, fluid restriction and monitor input and output. Change to p.o. Lasix 20 mg twice daily tomorrow.  Persistent A. Fib with RVR.   Per cardiology consult, reloading with amiodarone for a longer time at 400 mg bid, increased dose of metoprolol tartrate 75 mg 3 times daily, can be changed back to Toprol XL after cardioversion. Continue Xarelto. Status post cardioversion today. Amiodarone 200 mg twice daily and Lopressor 25 mg p.o. twice daily, hold if heart rate less than 50 per Dr. Rockey Situ.  Hypokalemia.    Improved with potassium supplement.  Hypertension.  Continue home hypertension medication.  I discussed with Dr. Donivan Scull PA Mr. Idolina Primer. All the records are reviewed and case discussed with Care Management/Social Worker. Management plans discussed with the patient, family and they are in agreement.  CODE STATUS: Full Code  TOTAL TIME TAKING CARE OF THIS PATIENT: 32 minutes.   More than 50% of the time was spent in counseling/coordination of care: YES  POSSIBLE D/C IN 1 DAYS, DEPENDING ON CLINICAL CONDITION.   Demetrios Loll M.D on 04/24/2018 at 1:01 PM  Between 7am to 6pm - Pager - [phone]  After 6pm go to [email or web address] - [facility]

## 2018-04-24 NOTE — Anesthesia Preprocedure Evaluation (Signed)
Anesthesia Evaluation  Patient identified by MRN, date of birth, ID band Patient awake    Reviewed: Allergy & Precautions, H&P , NPO status , Patient's Chart, lab work & pertinent test results, reviewed documented beta blocker date and time   History of Anesthesia Complications Negative for: history of anesthetic complications  Airway Mallampati: II   Neck ROM: full    Dental  (+) Poor Dentition   Pulmonary shortness of breath (on oxygen today),    Pulmonary exam normal        Cardiovascular hypertension, + Peripheral Vascular Disease and +CHF  Normal cardiovascular exam+ dysrhythmias Atrial Fibrillation  Rhythm:regular Rate:Normal     Neuro/Psych Anxiety negative neurological ROS  negative psych ROS   GI/Hepatic negative GI ROS, Neg liver ROS,   Endo/Other  neg diabetesHypothyroidism   Renal/GU negative Renal ROS  negative genitourinary   Musculoskeletal   Abdominal   Peds  Hematology negative hematology ROS (+)   Anesthesia Other Findings Past Medical History: No date: Bronchitis No date: Chronic combined systolic (congestive) and diastolic  (congestive) heart failure (HCC)     Comment:  a. 02/2018 Echo: EF 45-50%, diff HK, triv AI, mod MR,               mildly dil LA/RA, nl RV fxn, mod TR. PASP 40-7mmHg. No date: DJD (degenerative joint disease), cervical No date: History of stress test     Comment:  a. 01/2008 MV: EF 76%. Fair ex tol. No ischemia. No date: Hyperlipidemia No date: Hypertension No date: Hypothyroid No date: LBBB (left bundle branch block) No date: Leg pain     Comment:  a. 01/2018 ABI's wnl. No date: Persistent atrial fibrillation ([place])     Comment:  a. 01/2018 Event monitor: 45 runs of SVT, longes 14.5               sec. SVT felt to be Afib/flutter-->2% burden. Longest run              of AF 4h 77m (CHA2DS2VASc = 4-->Xarelto). 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 Past Surgical History: 02/27/2012: ELECTROPHYSIOLOGY STUDY; N/A     Comment:  Procedure: ELECTROPHYSIOLOGY STUDY;  Surgeon: Evans Lance, MD;  Location: [facility] CATH LAB;  Service:               Cardiovascular;  Laterality: N/A; 5/13: EPS and ablation for SVT     Comment:  slow pathway ablation by Dr Lovena Le No date: EYE SURGERY     Comment:  surgery for detatched retina 02/27/2012: [place]; N/A     Comment:  Procedure: [place];                Surgeon: Evans Lance, MD;  Location: [place] CATH LAB;                Service: Cardiovascular;  Laterality: N/A; BMI    Body Mass Index:  24.37 kg/m     Reproductive/Obstetrics negative OB ROS                             Anesthesia Physical  Anesthesia Plan  ASA: IV  Anesthesia Plan: General   Post-op Pain Management:    Induction: Intravenous  PONV Risk Score and Plan: 3 and Propofol infusion  Airway Management Planned: Nasal Cannula  Additional Equipment:   Intra-op Plan:   Post-operative Plan:   Informed Consent: I have reviewed the patients History and Physical, chart, labs and discussed the procedure including the risks, benefits and alternatives for the proposed anesthesia with the patient or authorized representative who has indicated his/her understanding and acceptance.   Dental Advisory Given  Plan Discussed with: CRNA  Anesthesia Plan Comments:         Anesthesia Quick Evaluation

## 2018-04-24 NOTE — Telephone Encounter (Signed)
Copied from [place] [id number]. Topic: Appointment Scheduling - Scheduling Inquiry for Clinic >> Apr 24, 2018 11:52 AM Cecelia Byars, NT wrote: Reason for [id number] is being  d/c from [facility]  today and will need a  f/u  visit with Dr Ancil Boozer please advise

## 2018-04-24 NOTE — Telephone Encounter (Signed)
Check with Dr. Ancil Boozer and see where she want to add her at next week.

## 2018-04-24 NOTE — Telephone Encounter (Signed)
Please advise as to where we can schedule pt for HFU

## 2018-04-24 NOTE — Care Management (Signed)
Per nursing staff patient ambulated independently to the bathroom.  Reported that patient ambulated 2 laps around the nursing station yesterday.  At baseline patient goes to [facility] every morning to walk.  Patient would not meet home bound criteria at this time for home health.

## 2018-04-24 NOTE — Progress Notes (Signed)
Progress Note  Patient Name: [name] Date of Encounter: 04/24/2018  Primary Cardiologist: Rockey Situ  Subjective   on supplemental oxygen.  Cardioversion this AM Potassium up to 4.2 Still mild SOB, not at her bnaseline    Inpatient Medications    Scheduled Meds: . [MAR Hold] amiodarone  400 mg Oral BID  . [MAR Hold] atorvastatin  20 mg Oral q1800  . [MAR Hold] busPIRone  5 mg Oral BID  . [MAR Hold] cholecalciferol  2,000 Units Oral Daily  . [MAR Hold] ezetimibe  10 mg Oral Daily  . [MAR Hold] furosemide  60 mg Intravenous Q12H  . [MAR Hold] guaiFENesin  600 mg Oral BID  . [MAR Hold] irbesartan  150 mg Oral Daily  . [MAR Hold] levothyroxine  137 mcg Oral QAC breakfast  . [MAR Hold] metoprolol tartrate  75 mg Oral TID  . [MAR Hold] potassium chloride  20 mEq Oral BID  . [MAR Hold] rivaroxaban  20 mg Oral Q supper  . [MAR Hold] sodium chloride flush  3 mL Intravenous Q12H  . [MAR Hold] sodium chloride flush  3 mL Intravenous Q12H   Continuous Infusions: . [MAR Hold] sodium chloride 1,000 mL (04/24/18 0725)   PRN Meds: [MAR Hold] sodium chloride, [MAR Hold] acetaminophen **OR** [MAR Hold] acetaminophen, [MAR Hold] albuterol, [MAR Hold] bisacodyl, [MAR Hold] lip balm, [MAR Hold] MUSCLE RUB, [MAR Hold] ondansetron **OR** [MAR Hold] ondansetron (ZOFRAN) IV, [MAR Hold] senna-docusate, [MAR Hold] sodium chloride flush, [MAR Hold] temazepam   Vital Signs    Vitals:   04/24/18 0736 04/24/18 0737 04/24/18 0738 04/24/18 0740  BP: (!) 121/58   112/65  Pulse: (!) 50 (!) 44 (!) 44 (!) 50  Resp: 19 (!) 23 (!) 23 12  Temp:      TempSrc:      SpO2: 94% 92% 93% 97%  Weight:      Height:        Intake/Output Summary (Last 24 hours) at 04/24/2018 0743 Last data filed at 04/24/2018 0737 Gross per 24 hour  Intake 700 ml  Output 1600 ml  Net -900 ml   Filed Weights   04/22/18 0825 04/23/18 1350 04/24/18 0458  Weight: 153 lb 3.5 oz (69.5 kg) 151 lb 14.4 oz (68.9 kg) 158 lb 4.6  oz (71.8 kg)    Telemetry    NSR/sinus brady - Personally Reviewed  ECG    n/a - Personally Reviewed  Physical Exam   Constitutional:  oriented to person, place, and time. No distress.  HENT:  Head: Normocephalic and atraumatic.  Eyes:  no discharge. No scleral icterus.  Neck: Normal range of motion. Neck supple. No JVD present.  Cardiovascular: normal rhythm, bradycardia normal heart sounds and intact distal pulses. Exam reveals no gallop and no friction rub. No edema No murmur heard. Pulmonary/Chest: Effort normal and breath sounds normal. No stridor. No respiratory distress.  no wheezes.  no rales.  no tenderness.  Abdominal: Soft.  no distension.  no tenderness.  Musculoskeletal: Normal range of motion.  no  tenderness or deformity.  Neurological:  normal muscle tone. Coordination normal. No atrophy Skin: Skin is warm and dry. No rash noted. not diaphoretic.  Psychiatric:  normal mood and affect. behavior is normal. Thought content normal.      Labs    Chemistry Recent Labs  Lab 04/22/18 0616 04/23/18 0424 04/24/18 0515  NA 138 136 138  K 3.3* 3.2* 4.2  CL 105 99 102  CO2 23 25  25  GLUCOSE 224* 130* 141*  BUN 19 19 25*  CREATININE 0.95 0.86 1.05*  CALCIUM 8.9 8.7* 9.2  PROT 6.9  --   --   ALBUMIN 4.4  --   --   AST 34  --   --   ALT 29  --   --   ALKPHOS 64  --   --   BILITOT 0.7  --   --   GFRNONAA 57* >60 51*  GFRAA >60 >60 59*  ANIONGAP 10 12 11      Hematology Recent Labs  Lab 04/22/18 0616 04/23/18 0424 04/24/18 0515  WBC 8.0 9.0 8.8  RBC 4.22 4.12 4.38  HGB 13.2 12.7 13.3  HCT 38.3 37.1 38.9  MCV 90.6 90.1 88.9  MCH 31.2 30.7 30.3  MCHC 34.4 34.1 34.1  RDW 15.6* 15.4* 15.2*  PLT 341 306 319    Cardiac Enzymes Recent Labs  Lab 04/22/18 0616  TROPONINI <0.03   No results for input(s): TROPIPOC in the last 168 hours.   BNP Recent Labs  Lab 04/22/18 0616  BNP 614.0*     DDimer No results for input(s): DDIMER in the last 168  hours.   Radiology    Dg Chest Port 1 View  Result Date: 04/22/2018 IMPRESSION: Pulmonary vascular congestion. Small pleural effusions bilaterally. Patchy consolidation right base concerning for early pneumonia. Mild left base atelectasis. There is aortic atherosclerosis. Aortic Atherosclerosis (ICD10-I70.0). Electronically Signed   By: Lowella Grip III M.D.   On: 04/22/2018 07:01    Cardiac Studies   Echo 03/21/2018: Study Conclusions  - Left ventricle: The cavity size was normal. Wall thickness was increased in a pattern of mild LVH. Systolic function was mildly reduced. The estimated ejection fraction was in the range of 45% to 50%. Diffuse hypokinesis. - Aortic valve: There was trivial regurgitation. - Mitral valve: Mildly calcified annulus. There was moderate regurgitation directed posteriorly. - Left atrium: The atrium was mildly dilated. - Right ventricle: The cavity size was normal. Systolic function was normal. - Right atrium: The atrium was mildly dilated. - Tricuspid valve: There was moderate regurgitation. - Pulmonary arteries: Systolic pressure was mildly to moderately increased, in the range of 40 mm Hg to 45 mm Hg. - Inferior vena cava: The vessel was normal in size. The respirophasic diameter changes were blunted (<50%), consistent with elevated central venous pressure. - Pericardium, extracardiac: A trivial pericardial effusion was identified.  Patient Profile     77 y.o. female with history of status post catheter ablation in 2013, SVT, recently diagnosed persistent Afib on Xarelto s/p DCCV 6/4,LBBB, mild carotid artery stenosis with carotid ultrasound on 01/2018 showing no significant stenosis, hypertension, hyperlipidemia, leg pain with normal ABIs in 01/2018, and DJD who is being seen today for the evaluation of acute on chronic combined CHF, persistent A. fib with RVR.  Assessment & Plan    1. Acute respiratory distress with  hypoxia:  acute on chronic combined CHF in the setting of persistent A. fib with RVR CR and BUN starting to climb, will monitor Consider changing to oral lasix BID tomorrow  2.  Persistent A. fib with RVR: -Status post briefly successful DCCV on 6/4.  back in A. fib approximately 6/7 to 6/8  DCCV  7/3, successful, bradycardia -Continue Xarelto -Metoprolol BID, amiodarone (rate currently in the 40s) Hold parameters placed : Amiodarone 200 BID hold for pulse <50 Metoprolol 25 BID hold for pulse <55  3.  Acute on chronic combined CHF:  Lasix to 60 mg twice daily with KCl repletion, then oral dosing 20 mg  tomorrow BID -Continue irbesartan   4.  Hypertension: -Reasonably controlled  5.  Hypokalemia: Better this AM   Total encounter time more than 25 minutes  Greater than 50% was spent in counseling and coordination of care with the patient   For questions or updates, please contact [place] Please consult [email or web address] for contact info under Cardiology/STEMI.    Signed, Signed, Esmond Plants, MD, Ph.D [facility]

## 2018-04-24 NOTE — Anesthesia Post-op Follow-up Note (Signed)
Anesthesia QCDR form completed.        

## 2018-04-25 LAB — BASIC METABOLIC PANEL
ANION GAP: 6 (ref 5–15)
BUN: 31 mg/dL — ABNORMAL HIGH (ref 8–23)
CO2: 28 mmol/L (ref 22–32)
CREATININE: 1.13 mg/dL — AB (ref 0.44–1.00)
Calcium: 8.9 mg/dL (ref 8.9–10.3)
Chloride: 104 mmol/L (ref 98–111)
GFR, EST AFRICAN AMERICAN: 54 mL/min — AB (ref >60)
GFR, EST NON AFRICAN AMERICAN: 46 mL/min — AB (ref >60)
GLUCOSE: 124 mg/dL — AB (ref 70–99)
Potassium: 4.3 mmol/L (ref 3.5–5.1)
Sodium: 138 mmol/L (ref 135–145)

## 2018-04-25 MED ORDER — METOPROLOL TARTRATE 50 MG PO TABS: 50.0000 mg | ORAL_TABLET | Freq: Two times a day (BID) | ORAL | Status: DC

## 2018-04-25 MED ORDER — VALSARTAN 320 MG PO TABS: 320.0000 mg | ORAL_TABLET | Freq: Every day | ORAL | 1 refills | Status: DC

## 2018-04-25 MED ORDER — APIXABAN 5 MG PO TABS: 5.0000 mg | ORAL_TABLET | Freq: Two times a day (BID) | ORAL | 0 refills | Status: DC

## 2018-04-25 MED ORDER — APIXABAN 5 MG PO TABS: 5.0000 mg | ORAL_TABLET | Freq: Two times a day (BID) | ORAL | Status: DC

## 2018-04-25 MED ORDER — FUROSEMIDE 20 MG PO TABS: 20.0000 mg | ORAL_TABLET | Freq: Two times a day (BID) | ORAL | Status: DC

## 2018-04-25 MED ORDER — IRBESARTAN 150 MG PO TABS
300.0000 mg | ORAL_TABLET | Freq: Every day | ORAL | Status: DC
  Administered 2018-04-25: 300 mg via ORAL
  Filled 2018-04-25: qty 2

## 2018-04-25 NOTE — Progress Notes (Signed)
Progress Note  Patient Name: [name] Date of Encounter: 04/25/2018  Primary Cardiologist: Rockey Situ  Subjective   Continues to maintain normal sinus rhythm Denies significant shortness of breath Ambulating without assistance Feels ready to go home Telemetry reviewed maintaining normal sinus rhythm Daughter at the bedside, long discussion concerning CHF management    Inpatient Medications    Scheduled Meds: . amiodarone  200 mg Oral BID  . apixaban  5 mg Oral BID  . atorvastatin  20 mg Oral q1800  . busPIRone  5 mg Oral BID  . cholecalciferol  2,000 Units Oral Daily  . ezetimibe  10 mg Oral Daily  . [START ON 04/26/2018] furosemide  20 mg Oral BID  . guaiFENesin  600 mg Oral BID  . irbesartan  300 mg Oral Daily  . levothyroxine  137 mcg Oral QAC breakfast  . metoprolol tartrate  50 mg Oral BID  . potassium chloride  20 mEq Oral BID  . sodium chloride flush  3 mL Intravenous Q12H   Continuous Infusions:  PRN Meds: acetaminophen **OR** acetaminophen, albuterol, bisacodyl, lip balm, MUSCLE RUB, ondansetron **OR** ondansetron (ZOFRAN) IV, senna-docusate, temazepam   Vital Signs    Vitals:   04/24/18 1958 04/25/18 0538 04/25/18 0758 04/25/18 1108  BP: (!) 150/66 (!) 144/67  (!) 141/50  Pulse: 71 (!) 55  (!) 57  Resp: 18 20  18   Temp: 97.7 F (36.5 C) 97.6 F (36.4 C)  97.6 F (36.4 C)  TempSrc: Oral Oral  Oral  SpO2: 95% 96%  97%  Weight:   147 lb 0.8 oz (66.7 kg)   Height:        Intake/Output Summary (Last 24 hours) at 04/25/2018 1407 Last data filed at 04/25/2018 1031 Gross per 24 hour  Intake 240 ml  Output -  Net 240 ml   Filed Weights   04/23/18 1350 04/24/18 0458 04/25/18 0758  Weight: 151 lb 14.4 oz (68.9 kg) 158 lb 4.6 oz (71.8 kg) 147 lb 0.8 oz (66.7 kg)    Telemetry    NSR/sinus brady - Personally Reviewed  ECG    n/a - Personally Reviewed  Physical Exam   No significant change in exam Constitutional:  oriented to person, place,  and time. No distress.  HENT:  Head: Normocephalic and atraumatic.  Eyes:  no discharge. No scleral icterus.  Neck: Normal range of motion. Neck supple. No JVD present.  Cardiovascular: normal rhythm, bradycardia normal heart sounds and intact distal pulses. Exam reveals no gallop and no friction rub. No edema No murmur heard. Pulmonary/Chest: Effort normal and breath sounds normal. No stridor. No respiratory distress.  no wheezes.  no rales.  no tenderness.  Abdominal: Soft.  no distension.  no tenderness.  Musculoskeletal: Normal range of motion.  no  tenderness or deformity.  Neurological:  normal muscle tone. Coordination normal. No atrophy Skin: Skin is warm and dry. No rash noted. not diaphoretic.  Psychiatric:  normal mood and affect. behavior is normal. Thought content normal.      Labs    Chemistry Recent Labs  Lab 04/22/18 0616 04/23/18 0424 04/24/18 0515 04/25/18 0425  NA 138 136 138 138  K 3.3* 3.2* 4.2 4.3  CL 105 99 102 104  CO2 23 25 25 28   GLUCOSE 224* 130* 141* 124*  BUN 19 19 25* 31*  CREATININE 0.95 0.86 1.05* 1.13*  CALCIUM 8.9 8.7* 9.2 8.9  PROT 6.9  --   --   --  ALBUMIN 4.4  --   --   --   AST 34  --   --   --   ALT 29  --   --   --   ALKPHOS 64  --   --   --   BILITOT 0.7  --   --   --   GFRNONAA 57* >60 51* 46*  GFRAA >60 >60 59* 54*  ANIONGAP 10 12 11 6      Hematology Recent Labs  Lab 04/22/18 0616 04/23/18 0424 04/24/18 0515  WBC 8.0 9.0 8.8  RBC 4.22 4.12 4.38  HGB 13.2 12.7 13.3  HCT 38.3 37.1 38.9  MCV 90.6 90.1 88.9  MCH 31.2 30.7 30.3  MCHC 34.4 34.1 34.1  RDW 15.6* 15.4* 15.2*  PLT 341 306 319    Cardiac Enzymes Recent Labs  Lab 04/22/18 0616  TROPONINI <0.03   No results for input(s): TROPIPOC in the last 168 hours.   BNP Recent Labs  Lab 04/22/18 0616  BNP 614.0*     DDimer No results for input(s): DDIMER in the last 168 hours.   Radiology    Dg Chest Port 1 View  Result Date: 04/22/2018 IMPRESSION:  Pulmonary vascular congestion. Small pleural effusions bilaterally. Patchy consolidation right base concerning for early pneumonia. Mild left base atelectasis. There is aortic atherosclerosis. Aortic Atherosclerosis (ICD10-I70.0). Electronically Signed   By: Lowella Grip III M.D.   On: 04/22/2018 07:01    Cardiac Studies   Echo 03/21/2018: Study Conclusions  - Left ventricle: The cavity size was normal. Wall thickness was increased in a pattern of mild LVH. Systolic function was mildly reduced. The estimated ejection fraction was in the range of 45% to 50%. Diffuse hypokinesis. - Aortic valve: There was trivial regurgitation. - Mitral valve: Mildly calcified annulus. There was moderate regurgitation directed posteriorly. - Left atrium: The atrium was mildly dilated. - Right ventricle: The cavity size was normal. Systolic function was normal. - Right atrium: The atrium was mildly dilated. - Tricuspid valve: There was moderate regurgitation. - Pulmonary arteries: Systolic pressure was mildly to moderately increased, in the range of 40 mm Hg to 45 mm Hg. - Inferior vena cava: The vessel was normal in size. The respirophasic diameter changes were blunted (<50%), consistent with elevated central venous pressure. - Pericardium, extracardiac: A trivial pericardial effusion was identified.  Patient Profile     77 y.o. female with history of status post catheter ablation in 2013, SVT, recently diagnosed persistent Afib on Xarelto s/p DCCV 6/4,LBBB, mild carotid artery stenosis with carotid ultrasound on 01/2018 showing no significant stenosis, hypertension, hyperlipidemia, leg pain with normal ABIs in 01/2018, and DJD who is being seen today for the evaluation of acute on chronic combined CHF, persistent A. fib with RVR.  Assessment & Plan    1. Acute respiratory distress with hypoxia:  acute on chronic combined CHF in the setting of persistent A. fib with RVR CR and  BUN starting to climb Would recommend she restart Lasix 20 twice a day 2 days after dischargengiven prerenal state -track daily weights Will need outpatient BMP  2.  Persistent A. fib with RVR: -Status post briefly successful DCCV on 6/4.  back in A. fib approximately 6/7 to 6/8  DCCV  7/3, successful, bradycardia -Continue Xarelto Amiodarone 200 BID  Metoprolol 50 BID   3.  Acute on chronic combined CHF: Changed to oral dose lasix 20 twice a day -Continue irbesartan, could increase up to 300 mg daily  4.  Hypertension: -Reasonably controlled  Case discussed with hospitalist service  Total encounter time more than 35 minutes  Greater than 50% was spent in counseling and coordination of care with the patient   For questions or updates, please contact [place] Please consult [email or web address] for contact info under Cardiology/STEMI.    Signed, Signed, Esmond Plants, MD, Ph.D [facility]

## 2018-04-25 NOTE — Care Management Important Message (Signed)
Important Message  Patient Details  Name: [name] MRN: [id number] Date of Birth: 1941-06-29   Medicare Important Message Given:  Yes    Juliann Pulse A Tiarah Shisler 04/25/2018, 9:33 AM

## 2018-04-25 NOTE — Discharge Summary (Signed)
[name] at [place] NAME: [name]    MR#:  [id number]  DATE OF BIRTH:  1941/03/17  DATE OF ADMISSION:  04/22/2018 ADMITTING PHYSICIAN: Demetrios Loll, MD  DATE OF DISCHARGE: 04/25/2018  PRIMARY CARE PHYSICIAN: Steele Sizer, MD    ADMISSION DIAGNOSIS:  Acute on chronic systolic congestive heart failure (HCC) [I50.23] Acute respiratory failure with hypoxia (HCC) [J96.01]  DISCHARGE DIAGNOSIS:  Principal Problem:   Acute respiratory failure with hypoxia (HCC) Active Problems:   Persistent atrial fibrillation (HCC)   Acute on chronic systolic congestive heart failure ([place])   SECONDARY DIAGNOSIS:   Past Medical History:  Diagnosis Date  . Bronchitis   . Chronic combined systolic (congestive) and diastolic (congestive) heart failure ([place])    a. 02/2018 Echo: EF 45-50%, diff HK, triv AI, mod MR, mildly dil LA/RA, nl RV fxn, mod TR. PASP 40-40mmHg.  Marland Kitchen DJD (degenerative joint disease), cervical   . History of stress test    a. 01/2008 MV: EF 76%. Fair ex tol. No ischemia.  . Hyperlipidemia   . Hypertension   . Hypothyroid   . LBBB (left bundle branch block)   . Leg pain    a. 01/2018 ABI's wnl.  . Persistent atrial fibrillation ([place])    a. 01/2018 Event monitor: 45 runs of SVT, longes 14.5 sec. SVT felt to be Afib/flutter-->2% burden. Longest run of AF 4h 31m (CHA2DS2VASc = 4-->Xarelto).  . 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   1.  Acute hypoxic respiratory failure due to acute on chronic combined systolic and diastolic heart failure in the setting of persistent atrial fibrillation with RVR. Patient has been weaned off of oxygen.  She initially required  BiPAP.  2.  Persistent atrial fibrillation with RVR status post DCCV in June: Patient was eval by cardiology. Due to persistent true fibrillation she underwent another attempt for DCCV. She will continue on amiodarone and metoprolol.  She will continue Eliquis for anticoagulation.  3.  Acute on chronic combined congestive heart failure ejection fraction 45%: Patient is euvolemic.  Patient will continue current dose of Lasix.  4.  Essential hypertension: Recommendations are to increase ARB.  She will continue metoprolol.  5.  Hypokalemia: This was repleted.  DISCHARGE CONDITIONS AND DIET:  Stable for discharge on heart healthy diet  CONSULTS OBTAINED:  Treatment Team:  Wellington Hampshire, MD  DRUG ALLERGIES:   Allergies  Allergen Reactions  . Codeine Nausea And Vomiting  . Erythromycin Itching and Swelling  . Septra [Sulfamethoxazole-Trimethoprim] Swelling  . Penicillins Swelling and Rash    Has patient had a PCN reaction causing immediate rash, facial/tongue/throat swelling, SOB or lightheadedness with hypotension: Yes Has patient had a PCN reaction causing severe rash involving mucus membranes or skin necrosis: No Has patient had a PCN reaction that required hospitalization: No Has patient had a PCN reaction occurring within the last 10 years: No If all of the above answers are "NO", then may proceed with Cephalosporin use.     DISCHARGE MEDICATIONS:   Allergies as of 04/25/2018      Reactions   Codeine Nausea And Vomiting   Erythromycin Itching, Swelling   Septra [sulfamethoxazole-trimethoprim] Swelling   Penicillins Swelling, Rash  Has patient had a PCN reaction causing immediate rash, facial/tongue/throat swelling, SOB or lightheadedness with hypotension: Yes Has patient had a PCN reaction causing severe rash involving mucus membranes or skin necrosis: No Has patient had a PCN reaction that required hospitalization: No Has patient had a PCN reaction occurring within  the last 10 years: No If all of the above answers are "NO", then may proceed with Cephalosporin use.      Medication List    STOP taking these medications   XARELTO 20 MG Tabs tablet Generic drug:  rivaroxaban     TAKE these medications   acetaminophen 650 MG CR tablet Commonly known as:  TYLENOL Take 1,300 mg by mouth every 8 (eight) hours as needed for pain.   amiodarone 200 MG tablet Commonly known as:  PACERONE Take 1 tablet (200 mg total) by mouth 2 (two) times daily.   apixaban 5 MG Tabs tablet Commonly known as:  ELIQUIS Take 1 tablet (5 mg total) by mouth 2 (two) times daily.   atorvastatin 20 MG tablet Commonly known as:  LIPITOR TAKE 1 TABLET(20 MG) BY MOUTH DAILY AT 6 PM   busPIRone 5 MG tablet Commonly known as:  BUSPAR Take 1 tablet (5 mg total) by mouth 2 (two) times daily.   cholecalciferol 1000 units tablet Commonly known as:  VITAMIN D Take 2,000 Units by mouth daily.   ezetimibe 10 MG tablet Commonly known as:  ZETIA Take 1 tablet (10 mg total) by mouth daily.   furosemide 20 MG tablet Commonly known as:  LASIX Take 1 tablet (20 mg total) by mouth daily.   guaiFENesin 600 MG 12 hr tablet Commonly known as:  MUCINEX Take 1 tablet (600 mg total) by mouth 2 (two) times daily.   levothyroxine 137 MCG tablet Commonly known as:  SYNTHROID, LEVOTHROID TAKE 1 TABLET(137 MCG) BY MOUTH EVERY MORNING   metoprolol succinate 50 MG 24 hr tablet Commonly known as:  TOPROL-XL Take 1 tablet (50 mg total) by mouth 2 (two) times daily.   multivitamin with minerals Tabs tablet Take 1 tablet by mouth daily.   potassium chloride 10 MEQ tablet Commonly known as:  K-DUR Take 1 tablet (10 mEq total) by mouth daily.   temazepam 15 MG capsule Commonly known as:  RESTORIL Take 1 capsule (15 mg total) by mouth at bedtime as needed for sleep.   valsartan 320 MG tablet Commonly known as:  DIOVAN Take 1 tablet (320 mg total) by mouth daily. What changed:     medication strength  how much to take         Today   CHIEF COMPLAINT:   Doing well this am    VITAL SIGNS:  Blood pressure (!) 144/67, pulse (!) 55, temperature 97.6 F (36.4 C), temperature source Oral, resp. rate 20, height 5\' 6"  (1.676 m), weight 66.7 kg (147 lb 0.8 oz), SpO2 96 %.   REVIEW OF SYSTEMS:  Review of Systems  Constitutional: Negative.  Negative for chills, fever and malaise/fatigue.  HENT: Negative.  Negative for ear discharge, ear pain, hearing loss, nosebleeds and sore throat.   Eyes: Negative.  Negative for blurred vision and pain.  Respiratory: Negative.  Negative for cough, hemoptysis, shortness of breath and wheezing.   Cardiovascular: Negative.  Negative for chest pain, palpitations and leg swelling.  Gastrointestinal: Negative.  Negative for abdominal pain, blood in stool, diarrhea, nausea and vomiting.  Genitourinary: Negative.  Negative for dysuria.  Musculoskeletal: Negative.  Negative for back pain.  Skin: Negative.   Neurological: Negative for dizziness, tremors, speech change, focal weakness, seizures and headaches.  Endo/Heme/Allergies: Negative.  Does not bruise/bleed easily.  Psychiatric/Behavioral: Negative.  Negative for depression, hallucinations and suicidal ideas.     PHYSICAL EXAMINATION:  GENERAL:  77 y.o.-year-old patient lying in the bed with no acute distress.  NECK:  Supple, no jugular venous distention. No thyroid enlargement, no tenderness.  LUNGS: Normal breath sounds bilaterally, no wheezing, rales,rhonchi  No use of accessory muscles of respiration.  CARDIOVASCULAR: S1, S2 normal. No murmurs, rubs, or gallops.  ABDOMEN: Soft, non-tender, non-distended. Bowel sounds present. No organomegaly or mass.  EXTREMITIES: No pedal edema, cyanosis, or clubbing.  PSYCHIATRIC: The patient is alert and oriented x 3.  SKIN: No obvious rash, lesion, or ulcer.   DATA REVIEW:   CBC Recent Labs  Lab 04/24/18 0515  WBC 8.8  HGB  13.3  HCT 38.9  PLT 319    Chemistries  Recent Labs  Lab 04/22/18 0616  04/25/18 0425  NA 138   < > 138  K 3.3*   < > 4.3  CL 105   < > 104  CO2 23   < > 28  GLUCOSE 224*   < > 124*  BUN 19   < > 31*  CREATININE 0.95   < > 1.13*  CALCIUM 8.9   < > 8.9  MG 2.2  --   --   AST 34  --   --   ALT 29  --   --   ALKPHOS 64  --   --   BILITOT 0.7  --   --    < > = values in this interval not displayed.    Cardiac Enzymes Recent Labs  Lab 04/22/18 0616  TROPONINI <0.03    Microbiology Results  @MICRORSLT48 @  RADIOLOGY:  No results found.    Allergies as of 04/25/2018      Reactions   Codeine Nausea And Vomiting   Erythromycin Itching, Swelling   Septra [sulfamethoxazole-trimethoprim] Swelling   Penicillins Swelling, Rash   Has patient had a PCN reaction causing immediate rash, facial/tongue/throat swelling, SOB or lightheadedness with hypotension: Yes Has patient had a PCN reaction causing severe rash involving mucus membranes or skin necrosis: No Has patient had a PCN reaction that required hospitalization: No Has patient had a PCN reaction occurring within the last 10 years: No If all of the above answers are "NO", then may proceed with Cephalosporin use.      Medication List    STOP taking these medications   XARELTO 20 MG Tabs tablet Generic drug:  rivaroxaban     TAKE these medications   acetaminophen 650 MG CR tablet Commonly known as:  TYLENOL Take 1,300 mg by mouth every 8 (eight) hours as needed for pain.   amiodarone 200 MG tablet Commonly known as:  PACERONE Take 1 tablet (200 mg total) by mouth 2 (two) times daily.   apixaban 5 MG Tabs tablet Commonly known as:  ELIQUIS Take 1 tablet (5 mg total) by mouth 2 (two) times daily.   atorvastatin 20 MG tablet Commonly known as:  LIPITOR TAKE 1 TABLET(20 MG) BY MOUTH DAILY AT 6 PM   busPIRone 5 MG tablet Commonly known as:  BUSPAR Take 1 tablet (5 mg total) by mouth 2 (two) times daily.    cholecalciferol 1000 units tablet Commonly known as:  VITAMIN D Take 2,000 Units by mouth daily.   ezetimibe 10 MG tablet Commonly  known as:  ZETIA Take 1 tablet (10 mg total) by mouth daily.   furosemide 20 MG tablet Commonly known as:  LASIX Take 1 tablet (20 mg total) by mouth daily.   guaiFENesin 600 MG 12 hr tablet Commonly known as:  MUCINEX Take 1 tablet (600 mg total) by mouth 2 (two) times daily.   levothyroxine 137 MCG tablet Commonly known as:  SYNTHROID, LEVOTHROID TAKE 1 TABLET(137 MCG) BY MOUTH EVERY MORNING   metoprolol succinate 50 MG 24 hr tablet Commonly known as:  TOPROL-XL Take 1 tablet (50 mg total) by mouth 2 (two) times daily.   multivitamin with minerals Tabs tablet Take 1 tablet by mouth daily.   potassium chloride 10 MEQ tablet Commonly known as:  K-DUR Take 1 tablet (10 mEq total) by mouth daily.   temazepam 15 MG capsule Commonly known as:  RESTORIL Take 1 capsule (15 mg total) by mouth at bedtime as needed for sleep.   valsartan 320 MG tablet Commonly known as:  DIOVAN Take 1 tablet (320 mg total) by mouth daily. What changed:    medication strength  how much to take           Management plans discussed with the patient and she is in agreement. Stable for discharge home   Patient should follow up with cardiology  CODE STATUS:     Code Status Orders  (From admission, onward)        Start     Ordered   04/22/18 0825  Full code  Continuous     04/22/18 0824    Code Status History    Date Active Date Inactive Code Status Order ID Comments User Context   03/25/2018 0740 03/27/2018 1621 Full Code [id number]  Epifanio Lesches, MD ED      TOTAL TIME TAKING CARE OF THIS PATIENT: 38 minutes.    Note: This dictation was prepared with Dragon dictation along with smaller phrase technology. Any transcriptional errors that result from this process are unintentional.  Lavoris Sparling M.D on 04/25/2018 at 10:29 AM  Between 7am to  6pm - Pager - [phone] After 6pm go to [email or web address] - password [email or web address]  Office  [phone]  CC: Primary care physician; Steele Sizer, MD

## 2018-04-25 NOTE — Anesthesia Postprocedure Evaluation (Signed)
Anesthesia Post Note  Patient: [name]  Procedure(s) Performed: CARDIOVERSION (N/A )  Patient location during evaluation: Cath Lab Anesthesia Type: General Level of consciousness: awake and alert Pain management: pain level controlled Vital Signs Assessment: post-procedure vital signs reviewed and stable Respiratory status: spontaneous breathing, nonlabored ventilation, respiratory function stable and patient connected to nasal cannula oxygen Cardiovascular status: blood pressure returned to baseline and stable Postop Assessment: no apparent nausea or vomiting Anesthetic complications: no     Last Vitals:  Vitals:   04/25/18 0538 04/25/18 1108  BP: (!) 144/67 (!) 141/50  Pulse: (!) 55 (!) 57  Resp: 20 18  Temp: 36.4 C 36.4 C  SpO2: 96% 97%    Last Pain:  Vitals:   04/25/18 1108  TempSrc: Oral  PainSc:                  Martha Clan

## 2018-04-25 NOTE — Progress Notes (Signed)
Pt's vitals were WDL. Gave her and daughter D/C instructions along with follow up appts. She stated she understood her instructions and would comply. Pt was d/c to her daughter and she will be taking her home.

## 2018-04-26 ENCOUNTER — Encounter: Payer: Self-pay | Admitting: Cardiovascular Disease

## 2018-04-26 ENCOUNTER — Telehealth: Payer: Self-pay

## 2018-04-26 NOTE — Telephone Encounter (Signed)
Spoke with patient and reviewed Dr. Donivan Scull recommendations. Advised that he would prefer her to wait for 30 days from her cardioversion. She verbalized understanding and states that the provider is also aware of this. She had no further questions at this time.

## 2018-04-26 NOTE — Telephone Encounter (Signed)
I have made the 1st attempt to contact the patient or family member in charge, in order to follow up from recently being discharged from the hospital. I left a message on voicemail requesting a CB. -MM 

## 2018-04-26 NOTE — Telephone Encounter (Signed)
She will need to wait 30 days after recent cardioversion before surgery Following that can hold xarelto for procedure

## 2018-04-29 NOTE — Telephone Encounter (Signed)
She already has an appointment for next week and she will see Dr. Rockey Situ before she sees me

## 2018-04-30 DIAGNOSIS — M542 Cervicalgia: Secondary | ICD-10-CM | POA: Diagnosis not present

## 2018-05-01 NOTE — Telephone Encounter (Signed)
Patient stated she will follow up here after seeing Dr. Rockey Situ

## 2018-05-04 NOTE — Progress Notes (Signed)
Cardiology Office Note  Date:  05/07/2018   ID:  [name], DOB 1940-11-19, MRN [id number]  PCP:  Steele Sizer, MD   Chief Complaint  Patient presents with  . Other    Follow up from Cardioversion 04/24/2018. Patient denies chest pain and SOB. Meds reviewed verbally with patient.     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 Lovena Le to have sinus tachycardia, lost to cardiology follow-up Carotid u/s  01/2018 with no significant blockages Who presents for follow-up of her paroxysmal  atrial fibrillation   in the hospital July 2019 for acute on chronic combined CHF Persistent atrial fibrillation with RVR and respiratory distress with hypoxia Treated with Lasix Status post briefly successful DCCV on 6/4.  back in A. fib approximately 6/7 to 6/8  DCCV  7/3, successful, bradycardia  In follow-up today she feels well Little bit of lower extremity edema which she attributes to her veins right leg worse than left Takes Lasix daily,with potassium,  denies significant shortness of breath Brings blood pressure measurements with her showing blood pressure trending up into the afternoon and evening Often with pressures in the 027O systolic Morning 536 range in the morning Worried by some bradycardia workup heart rate 48 this morning took metoprolol 25 and instead of 50  Bad neck pain, previously seen by pain clinic  She's having rare palpitations maybe 2 times but for the most part maintaining normal rhythm  EKG personally reviewed by myself on todays visit Shows normal sinus rhythm rate 58 bpm left bundle branch block  Other past medical history reviewed Event monitor She is having paroxysmal atrial fibrillation  She reports having mild carotid stenosis on the left Initially found on x-ray of neck.  vascular study done back in 2011  that showed mild plaque formation. Repeat ultrasound April 2019 with plaque noted,  less than 50%   EKG dated 12/19/2017  Showing periods of irregular rhythm, as well as normal sinus rhythm rate 90 bpm Fusion beats, PVCs left bundle branch block unable to exclude Paroxysmal atrial fibrillation   PMH:   has a past medical history of Bronchitis, Chronic combined systolic (congestive) and diastolic (congestive) heart failure ([place]), DJD (degenerative joint disease), cervical, History of stress test, Hyperlipidemia, Hypertension, Hypothyroid, LBBB (left bundle branch block), Leg pain, Persistent atrial fibrillation ([place]), and SVT (supraventricular tachycardia) ([place]).  PSH:    Past Surgical History:  Procedure Laterality Date  . CARDIOVERSION N/A 03/26/2018   Procedure: CARDIOVERSION;  Surgeon: Nelva Bush, MD;  Location: [facility] ORS;  Service: Cardiovascular;  Laterality: N/A;  . CARDIOVERSION N/A 04/24/2018   Procedure: CARDIOVERSION;  Surgeon: Minna Merritts, MD;  Location: [facility] ORS;  Service: Cardiovascular;  Laterality: N/A;  . ELECTROPHYSIOLOGY STUDY N/A 02/27/2012   Procedure: ELECTROPHYSIOLOGY STUDY;  Surgeon: Evans Lance, MD;  Location: [facility] CATH LAB;  Service: Cardiovascular;  Laterality: N/A;  . EPS and ablation for SVT  5/13   slow pathway ablation by Dr Lovena Le  . EYE SURGERY     surgery for detatched retina  . SUPRAVENTRICULAR TACHYCARDIA ABLATION N/A 02/27/2012   Procedure: SUPRAVENTRICULAR TACHYCARDIA ABLATION;  Surgeon: Evans Lance, MD;  Location[facility] CATH LAB;  Service: Cardiovascular;  Laterality: N/A;    Current Outpatient Medications  Medication Sig Dispense Refill  . acetaminophen (TYLENOL) 650 MG CR tablet Take 1,300 mg by mouth  every 8 (eight) hours as needed for pain.    Marland Kitchen amiodarone (PACERONE) 200 MG tablet Take 1 tablet (200 mg total) by mouth daily. 90 tablet 3  . apixaban (ELIQUIS) 5 MG TABS tablet Take 1 tablet (5 mg total) by mouth 2 (two) times daily. 60 tablet  0  . atorvastatin (LIPITOR) 20 MG tablet TAKE 1 TABLET(20 MG) BY MOUTH DAILY AT 6 PM 90 tablet 1  . busPIRone (BUSPAR) 5 MG tablet Take 1 tablet (5 mg total) by mouth 2 (two) times daily. 180 tablet 1  . cholecalciferol (VITAMIN D) 1000 UNITS tablet Take 2,000 Units by mouth daily.    Marland Kitchen ezetimibe (ZETIA) 10 MG tablet Take 1 tablet (10 mg total) by mouth daily. 30 tablet 11  . furosemide (LASIX) 20 MG tablet Take 1 tablet (20 mg total) by mouth daily. 30 tablet 1  . guaiFENesin (MUCINEX) 600 MG 12 hr tablet Take 1 tablet (600 mg total) by mouth 2 (two) times daily. 60 tablet 0  . levothyroxine (SYNTHROID, LEVOTHROID) 137 MCG tablet TAKE 1 TABLET(137 MCG) BY MOUTH EVERY MORNING 90 tablet 1  . metoprolol succinate (TOPROL-XL) 50 MG 24 hr tablet Take 1 tablet (50 mg total) by mouth 2 (two) times daily.    . Multiple Vitamin (MULITIVITAMIN WITH MINERALS) TABS Take 1 tablet by mouth daily.    . potassium chloride (K-DUR) 10 MEQ tablet Take 1 tablet (10 mEq total) by mouth daily. 30 tablet 1  . temazepam (RESTORIL) 15 MG capsule Take 1 capsule (15 mg total) by mouth at bedtime as needed for sleep. 30 capsule 0  . valsartan (DIOVAN) 320 MG tablet Take 1 tablet (320 mg total) by mouth daily. 30 tablet 1  . doxazosin (CARDURA) 1 MG tablet Take 1 tablet (1 mg total) by mouth daily. 30 tablet 11   No current facility-administered medications for this visit.      Allergies:   Codeine; Erythromycin; Septra [sulfamethoxazole-trimethoprim]; and Penicillins   Social History:  The patient  reports that she has never smoked. She has never used smokeless tobacco. She reports that she does not drink alcohol or use drugs.   Family History:   family history includes Alzheimer's disease in her mother; Cancer in her father and paternal aunt; Stroke in her father; Thyroid disease in her maternal grandmother.    Review of Systems: Review of Systems  Constitutional: Negative.   Respiratory: Negative.    Cardiovascular: Positive for leg swelling.  Gastrointestinal: Negative.   Musculoskeletal: Negative.   Neurological: Negative.   Psychiatric/Behavioral: Negative.   All other systems reviewed and are negative.    PHYSICAL EXAM: VS:  BP (!) 170/76 (BP Location: Left Arm, Patient Position: Sitting, Cuff Size: Normal)   Pulse (!) 58   Ht 5\' 6"  (1.676 m)   Wt 148 lb 5 oz (67.3 kg) Comment: Weight was taken from home this morning.  BMI 23.94 kg/m  , BMI Body mass index is 23.94 kg/m.  Rhythm has changed on today's visit compared to prior office visit Constitutional:  oriented to person, place, and time. No distress.  HENT:  Head: Normocephalic and atraumatic.  Eyes:  no discharge. No scleral icterus.  Neck: Normal range of motion. Neck supple. No JVD present. bruit on the left Cardiovascular RRR,  normal heart sounds and intact distal pulses. Exam reveals no gallop and no friction rub. No edema No murmur heard. Pulmonary/Chest: Effort normal and breath sounds normal. No stridor. No respiratory distress.  no wheezes.  no rales.  no tenderness.  Abdominal: Soft.  no distension.  no tenderness.  Musculoskeletal: Normal range of motion.  no  tenderness or deformity.  Neurological:  normal muscle tone. Coordination normal. No atrophy Skin: Skin is warm and dry. No rash noted. not diaphoretic.  Psychiatric:  normal mood and affect. behavior is normal. Thought content normal.    Recent Labs: 04/05/2018: TSH 4.13 04/22/2018: ALT 29; B Natriuretic Peptide 614.0; Magnesium 2.2 04/24/2018: Hemoglobin 13.3; Platelets 319 04/25/2018: BUN 31; Creatinine, Ser 1.13; Potassium 4.3; Sodium 138    Lipid Panel Lab Results  Component Value Date   CHOL 173 09/04/2017   HDL 60 09/04/2017   LDLCALC 87 09/04/2017   TRIG 159 (H) 09/04/2017      Wt Readings from Last 3 Encounters:  05/07/18 148 lb 5 oz (67.3 kg)  04/25/18 147 lb 0.8 oz (66.7 kg)  04/05/18 153 lb 1.6 oz (69.4 kg)       ASSESSMENT  AND PLAN:  Atrial fibrillation, new onset ([place]) - Plan: LONG TERM MONITOR (3-14 DAYS) Recommend she stay on metoprolol 50 twice a day decrease amiodarone down to 200 mg daily, stay on anticoagulation  PAD (peripheral artery disease) ([place]) - Plan: EKG 12-Lead Carotid stenosis less than 50% on the left  Recommended LDL less than 70 Statin with Zetia. No changes made Ultrasound reviewed with her again  SVT (supraventricular tachycardia) (HCC) - Plan: EKG 12-Lead On amiodarone and metoprolol Denies any recent episodes  Mixed hyperlipidemia Lipitor 20, Zetia 10 mg daily Repeat lipid panel through primary care Goal LDL less than 70  Essential hypertension We will add Cardura 1 mg in the morning in addition to her current medications given systolic pressures 245Y at night Pressure 170 on today's visit. She reports this is from anxiety She will call with blood pressure measurements from home  Disposition:   F/U  6 month   Total encounter time more than 25 minutes  Greater than 50% was spent in counseling and coordination of care with the patient    Orders Placed This Encounter  Procedures  . EKG 12-Lead     Signed, Esmond Plants, M.D., Ph.D. 05/07/2018  [place], [place]

## 2018-05-07 ENCOUNTER — Encounter: Payer: Self-pay | Admitting: Cardiovascular Disease

## 2018-05-07 ENCOUNTER — Ambulatory Visit: Payer: Medicare Other | Admitting: Cardiovascular Disease

## 2018-05-07 VITALS — BP 170/76 | HR 58 | Ht 66.0 in | Wt 148.3 lb

## 2018-05-07 DIAGNOSIS — R0602 Shortness of breath: Secondary | ICD-10-CM

## 2018-05-07 DIAGNOSIS — I340 Nonrheumatic mitral (valve) insufficiency: Secondary | ICD-10-CM | POA: Diagnosis not present

## 2018-05-07 DIAGNOSIS — I4891 Unspecified atrial fibrillation: Secondary | ICD-10-CM | POA: Diagnosis not present

## 2018-05-07 DIAGNOSIS — I739 Peripheral vascular disease, unspecified: Secondary | ICD-10-CM

## 2018-05-07 DIAGNOSIS — E782 Mixed hyperlipidemia: Secondary | ICD-10-CM

## 2018-05-07 DIAGNOSIS — I471 Supraventricular tachycardia: Secondary | ICD-10-CM | POA: Diagnosis not present

## 2018-05-07 DIAGNOSIS — I5021 Acute systolic (congestive) heart failure: Secondary | ICD-10-CM

## 2018-05-07 DIAGNOSIS — I272 Pulmonary hypertension, unspecified: Secondary | ICD-10-CM | POA: Diagnosis not present

## 2018-05-07 MED ORDER — AMIODARONE HCL 200 MG PO TABS: 200.0000 mg | ORAL_TABLET | Freq: Every day | ORAL | 3 refills | Status: DC

## 2018-05-07 MED ORDER — DOXAZOSIN MESYLATE 1 MG PO TABS: 1.0000 mg | ORAL_TABLET | Freq: Every day | ORAL | 11 refills | Status: DC

## 2018-05-07 NOTE — Patient Instructions (Addendum)
Medication Instructions:   Please start cardura/doxazosin one a day in the morning  Please decrease the amiodarone down to one a day  Labwork:  No new labs needed  Testing/Procedures:  No further testing at this time   Follow-Up: It was a pleasure seeing you in the office today. Please call us if you have new issues that need to be addressed before your next appt.  [phone]  Your physician wants you to follow-up in: 6 months.  You will receive a reminder letter in the mail two months in advance. If you don't receive a letter, please call our office to schedule the follow-up appointment.  If you need a refill on your cardiac medications before your next appointment, please call your pharmacy.  For educational health videos Log in to : [email or web address] Or : [email or web address], password : [email or web address]

## 2018-05-08 DIAGNOSIS — M542 Cervicalgia: Secondary | ICD-10-CM | POA: Diagnosis not present

## 2018-05-08 NOTE — Telephone Encounter (Signed)
Left message for [name], per dr Rockey Situ, the patient can hold blood thinner for surgery but surgery can not be done until after 05-25-18, which is 1 month post cardioversion. She is to call with other questions.

## 2018-05-08 NOTE — Telephone Encounter (Signed)
Please call and update status of clearance request

## 2018-05-10 ENCOUNTER — Inpatient Hospital Stay: Payer: Medicare Other | Admitting: Family Medicine

## 2018-05-14 ENCOUNTER — Ambulatory Visit: Payer: Medicare Other | Admitting: Family

## 2018-05-15 DIAGNOSIS — M542 Cervicalgia: Secondary | ICD-10-CM | POA: Diagnosis not present

## 2018-05-17 ENCOUNTER — Ambulatory Visit (INDEPENDENT_AMBULATORY_CARE_PROVIDER_SITE_OTHER): Payer: Medicare Other

## 2018-05-17 ENCOUNTER — Ambulatory Visit: Payer: Medicare Other | Admitting: Family Medicine

## 2018-05-17 ENCOUNTER — Encounter: Payer: Self-pay | Admitting: Family Medicine

## 2018-05-17 VITALS — BP 150/76 | HR 63 | Temp 97.8°F | Resp 16 | Ht 66.0 in | Wt 149.8 lb

## 2018-05-17 VITALS — BP 148/50 | HR 64 | Temp 97.8°F | Resp 16 | Ht 66.0 in | Wt 149.8 lb

## 2018-05-17 DIAGNOSIS — N183 Chronic kidney disease, stage 3 unspecified: Secondary | ICD-10-CM

## 2018-05-17 DIAGNOSIS — I10 Essential (primary) hypertension: Secondary | ICD-10-CM | POA: Diagnosis not present

## 2018-05-17 DIAGNOSIS — I5042 Chronic combined systolic (congestive) and diastolic (congestive) heart failure: Secondary | ICD-10-CM | POA: Diagnosis not present

## 2018-05-17 DIAGNOSIS — E039 Hypothyroidism, unspecified: Secondary | ICD-10-CM

## 2018-05-17 DIAGNOSIS — I4891 Unspecified atrial fibrillation: Secondary | ICD-10-CM | POA: Diagnosis not present

## 2018-05-17 DIAGNOSIS — F411 Generalized anxiety disorder: Secondary | ICD-10-CM

## 2018-05-17 DIAGNOSIS — E782 Mixed hyperlipidemia: Secondary | ICD-10-CM

## 2018-05-17 DIAGNOSIS — I272 Pulmonary hypertension, unspecified: Secondary | ICD-10-CM

## 2018-05-17 DIAGNOSIS — F5102 Adjustment insomnia: Secondary | ICD-10-CM | POA: Diagnosis not present

## 2018-05-17 DIAGNOSIS — G8929 Other chronic pain: Secondary | ICD-10-CM

## 2018-05-17 DIAGNOSIS — N393 Stress incontinence (female) (male): Secondary | ICD-10-CM

## 2018-05-17 DIAGNOSIS — I482 Chronic atrial fibrillation, unspecified: Secondary | ICD-10-CM

## 2018-05-17 DIAGNOSIS — Z0000 Encounter for general adult medical examination without abnormal findings: Secondary | ICD-10-CM | POA: Diagnosis not present

## 2018-05-17 DIAGNOSIS — M542 Cervicalgia: Secondary | ICD-10-CM

## 2018-05-17 MED ORDER — POTASSIUM CHLORIDE ER 10 MEQ PO TBCR: 10.0000 meq | EXTENDED_RELEASE_TABLET | Freq: Every day | ORAL | 1 refills | Status: DC

## 2018-05-17 MED ORDER — VALSARTAN 320 MG PO TABS: 320.0000 mg | ORAL_TABLET | Freq: Every day | ORAL | 1 refills | Status: DC

## 2018-05-17 MED ORDER — APIXABAN 5 MG PO TABS: 5.0000 mg | ORAL_TABLET | Freq: Two times a day (BID) | ORAL | 5 refills | Status: DC

## 2018-05-17 MED ORDER — FUROSEMIDE 20 MG PO TABS: 20.0000 mg | ORAL_TABLET | Freq: Every day | ORAL | 1 refills | Status: DC

## 2018-05-17 MED ORDER — TEMAZEPAM 15 MG PO CAPS: 15.0000 mg | ORAL_CAPSULE | Freq: Every evening | ORAL | 2 refills | Status: DC | PRN

## 2018-05-17 NOTE — Patient Instructions (Signed)
[name] , Thank you for taking time to come for your Medicare Wellness Visit. I appreciate your ongoing commitment to your health goals. Please review the following plan we discussed and let me know if I can assist you in the future.   Screening recommendations/referrals: Colorectal Screening: Up to date Mammogram: Up to date Bone Density: Up to date  Vision and Dental Exams: Recommended annual ophthalmology exams for early detection of glaucoma and other disorders of the eye Recommended annual dental exams for proper oral hygiene  Vaccinations: Influenza vaccine: Up to date Pneumococcal vaccine: Up to date Tdap vaccine: Up to date Shingles vaccine: Please call your insurance company to determine your out of pocket expense for the Shingrix vaccine. You may also receive this vaccine at your local pharmacy or Health Dept.    Advanced directives: Please bring a copy of your POA (Power of Attorney) and/or Living Will to your next appointment.  Goals: Recommend to exercise for at least 150 minutes per week.  Next appointment: Please schedule your Annual Wellness Visit with your Nurse Health Advisor in one year.  Preventive Care 15 Years and Older, Female Preventive care refers to lifestyle choices and visits with your health care provider that can promote health and wellness. What does preventive care include?  A yearly physical exam. This is also called an annual well check.  Dental exams once or twice a year.  Routine eye exams. Ask your health care provider how often you should have your eyes checked.  Personal lifestyle choices, including:  Daily care of your teeth and gums.  Regular physical activity.  Eating a healthy diet.  Avoiding tobacco and drug use.  Limiting alcohol use.  Practicing safe sex.  Taking low-dose aspirin every day.  Taking vitamin and mineral supplements as recommended by your health care provider. What happens during an annual well  check? The services and screenings done by your health care provider during your annual well check will depend on your age, overall health, lifestyle risk factors, and family history of disease. Counseling  Your health care provider may ask you questions about your:  Alcohol use.  Tobacco use.  Drug use.  Emotional well-being.  Home and relationship well-being.  Sexual activity.  Eating habits.  History of falls.  Memory and ability to understand (cognition).  Work and work Statistician.  Reproductive health. Screening  You may have the following tests or measurements:  Height, weight, and BMI.  Blood pressure.  Lipid and cholesterol levels. These may be checked every 5 years, or more frequently if you are over 60 years old.  Skin check.  Lung cancer screening. You may have this screening every year starting at age 70 if you have a 30-pack-year history of smoking and currently smoke or have quit within the past 15 years.  Fecal occult blood test (FOBT) of the stool. You may have this test every year starting at age 52.  Flexible sigmoidoscopy or colonoscopy. You may have a sigmoidoscopy every 5 years or a colonoscopy every 10 years starting at age 63.  Hepatitis C blood test.  Hepatitis B blood test.  Sexually transmitted disease (STD) testing.  Diabetes screening. This is done by checking your blood sugar (glucose) after you have not eaten for a while (fasting). You may have this done every 1-3 years.  Bone density scan. This is done to screen for osteoporosis. You may have this done starting at age 88.  Mammogram. This may be done every 1-2 years. Talk  to your health care provider about how often you should have regular mammograms. Talk with your health care provider about your test results, treatment options, and if necessary, the need for more tests. Vaccines  Your health care provider may recommend certain vaccines, such as:  Influenza vaccine. This is  recommended every year.  Tetanus, diphtheria, and acellular pertussis (Tdap, Td) vaccine. You may need a Td booster every 10 years.  Zoster vaccine. You may need this after age 64.  Pneumococcal 13-valent conjugate (PCV13) vaccine. One dose is recommended after age 43.  Pneumococcal polysaccharide (PPSV23) vaccine. One dose is recommended after age 25. Talk to your health care provider about which screenings and vaccines you need and how often you need them. This information is not intended to replace advice given to you by your health care provider. Make sure you discuss any questions you have with your health care provider. Document Released: 11/05/2015 Document Revised: 06/28/2016 Document Reviewed: 08/10/2015 Elsevier Interactive Patient Education  2017 [employer] Prevention in the Home Falls can cause injuries. They can happen to people of all ages. There are many things you can do to make your home safe and to help prevent falls. What can I do on the outside of my home?  Regularly fix the edges of walkways and driveways and fix any cracks.  Remove anything that might make you trip as you walk through a door, such as a raised step or threshold.  Trim any bushes or trees on the path to your home.  Use bright outdoor lighting.  Clear any walking paths of anything that might make someone trip, such as rocks or tools.  Regularly check to see if handrails are loose or broken. Make sure that both sides of any steps have handrails.  Any raised decks and porches should have guardrails on the edges.  Have any leaves, snow, or ice cleared regularly.  Use sand or salt on walking paths during winter.  Clean up any spills in your garage right away. This includes oil or grease spills. What can I do in the bathroom?  Use night lights.  Install grab bars by the toilet and in the tub and shower. Do not use towel bars as grab bars.  Use non-skid mats or decals in the tub or  shower.  If you need to sit down in the shower, use a plastic, non-slip stool.  Keep the floor dry. Clean up any water that spills on the floor as soon as it happens.  Remove soap buildup in the tub or shower regularly.  Attach bath mats securely with double-sided non-slip rug tape.  Do not have throw rugs and other things on the floor that can make you trip. What can I do in the bedroom?  Use night lights.  Make sure that you have a light by your bed that is easy to reach.  Do not use any sheets or blankets that are too big for your bed. They should not hang down onto the floor.  Have a firm chair that has side arms. You can use this for support while you get dressed.  Do not have throw rugs and other things on the floor that can make you trip. What can I do in the kitchen?  Clean up any spills right away.  Avoid walking on wet floors.  Keep items that you use a lot in easy-to-reach places.  If you need to reach something above you, use a strong step stool that  has a grab bar.  Keep electrical cords out of the way.  Do not use floor polish or wax that makes floors slippery. If you must use wax, use non-skid floor wax.  Do not have throw rugs and other things on the floor that can make you trip. What can I do with my stairs?  Do not leave any items on the stairs.  Make sure that there are handrails on both sides of the stairs and use them. Fix handrails that are broken or loose. Make sure that handrails are as long as the stairways.  Check any carpeting to make sure that it is firmly attached to the stairs. Fix any carpet that is loose or worn.  Avoid having throw rugs at the top or bottom of the stairs. If you do have throw rugs, attach them to the floor with carpet tape.  Make sure that you have a light switch at the top of the stairs and the bottom of the stairs. If you do not have them, ask someone to add them for you. What else can I do to help prevent  falls?  Wear shoes that:  Do not have high heels.  Have rubber bottoms.  Are comfortable and fit you well.  Are closed at the toe. Do not wear sandals.  If you use a stepladder:  Make sure that it is fully opened. Do not climb a closed stepladder.  Make sure that both sides of the stepladder are locked into place.  Ask someone to hold it for you, if possible.  Clearly mark and make sure that you can see:  Any grab bars or handrails.  First and last steps.  Where the edge of each step is.  Use tools that help you move around (mobility aids) if they are needed. These include:  Canes.  Walkers.  Scooters.  Crutches.  Turn on the lights when you go into a dark area. Replace any light bulbs as soon as they burn out.  Set up your furniture so you have a clear path. Avoid moving your furniture around.  If any of your floors are uneven, fix them.  If there are any pets around you, be aware of where they are.  Review your medicines with your doctor. Some medicines can make you feel dizzy. This can increase your chance of falling. Ask your doctor what other things that you can do to help prevent falls. This information is not intended to replace advice given to you by your health care provider. Make sure you discuss any questions you have with your health care provider. Document Released: 08/05/2009 Document Revised: 03/16/2016 Document Reviewed: 11/13/2014 Elsevier Interactive Patient Education  2017 [employer].

## 2018-05-17 NOTE — Progress Notes (Signed)
Name: [name]   MRN: [id number]    DOB: 04/18/1941   Date:05/17/2018       Progress Note  Subjective  Chief Complaint  Chief Complaint  Patient presents with  . Medication Refill  . Hypertension  . Cervical Radiculitis    HPI  Carotid calcification: found on x-ray of neck. She also had a vascular study done back in 2011 that showed mild plaque formation on right. . She is now on statin, Zetia and Eliquis. Off aspirin since ablation 04/2018.  She denies headache or dizziness, she states she has balance problems - but states secondary to visual disturbance ( she can see better from left eye than right). She went back to Dr. Erven Colla and had ABI that was normal, carotid doppler unchanged and will go back in 2 years   Cervical radiculitis: she is now going to [facility] under the care of Dr. Larose Hires and pain management there. She is currently only having neck pain that radiates to shoulders, not to hands and no weakness. She is waiting to have steroid injection in August , about one month after cardioversion  HTN: bp at home usually controlled but up during office visits. Possible white coat component. SBP is okay today, no side effects of medications. Reviewed notes from Dr. Rockey Situ and had some medication adjustment on her last visit. Medication list reviewed today with patient.   Afib : she had a cardioversion in June and another one in July , doing well since, no palpitation or chest pain since. Compliant with medications, no blood in stools or easy bruising.    CHF chronic: still uses two pillows at night, but states mostly because she is scared of removing them. She has mild sob with moderate activity, no chest pain, denies lower extremity edema.   CKI: on previous labs, we will recheck prior to her next visit, denies pruritis and has good urine output  GAD: doing well on Buspar twice daily, she also takes temazepam qhs prn and it helps her sleep, no side effects of  medications.   Hypothyroidism: doing well on medication, denies hair loss, denies constipation.    Patient Active Problem List   Diagnosis Date Noted  . 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 Shortness of breath 05/07/2018  . Acute respiratory failure with hypoxia ([place]) 04/22/2018  . Acute systolic CHF (congestive heart failure), NYHA class 2 ([place])   . Pulmonary hypertension, unspecified ([place]) 04/05/2018  . Chronic combined systolic (congestive) and diastolic (congestive) heart failure ([place]) 04/05/2018  . Persistent atrial fibrillation ([place]) 03/25/2018  . Paroxysmal sinus tachycardia ([place]) 01/29/2018  . Leg pain 01/04/2018  . PAD (peripheral artery disease) ([place]) 01/04/2018  . Carotid artery calcification, bilateral 12/19/2017  . Cervical radiculitis 12/19/2017  . Atrial fibrillation, new onset ([place]) 12/19/2017  . Hyperglycemia 03/06/2016  . Hypothyroid 04/21/2015  . 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 Anxiety 04/21/2015  . Hyperlipidemia 04/21/2015  . Diuretic-induced hypokalemia 04/21/2015  . Palpitations 12/18/2012  . SVT (supraventricular tachycardia) ([place]) 05/27/2012  . Hypertension 05/27/2012    Past Surgical History:  Procedure Laterality Date  . CARDIOVERSION N/A 03/26/2018   Procedure: CARDIOVERSION;  Surgeon: Nelva Bush, MD;  Location: [facility] ORS;  Service: Cardiovascular;  Laterality: N/A;  . CARDIOVERSION N/A 04/24/2018   Procedure: CARDIOVERSION;  Surgeon: Minna Merritts, MD;  Location: [facility] ORS;  Service: Cardiovascular;  Laterality: N/A;  . ELECTROPHYSIOLOGY STUDY N/A 02/27/2012   Procedure: ELECTROPHYSIOLOGY STUDY;  Surgeon: Champ Mungo  Lovena Le, MD;  Location: [facility] CATH LAB;  Service: Cardiovascular;  Laterality: N/A;  . EPS and ablation for SVT  5/13   slow pathway ablation by Dr Lovena Le  . EYE SURGERY     surgery for detatched retina  . SUPRAVENTRICULAR TACHYCARDIA ABLATION N/A 02/27/2012   Procedure: SUPRAVENTRICULAR TACHYCARDIA  ABLATION;  Surgeon: Evans Lance, MD;  Location: [facility] CATH LAB;  Service: Cardiovascular;  Laterality: N/A;    Family History  Problem Relation Age of Onset  . Alzheimer's disease Mother   . Stroke Father   . Breast cancer Father   . Heart disease Father        Pacemaker  . Breast cancer Paternal Aunt   . Kidney cancer Maternal Grandmother   . Alzheimer's disease Maternal Grandfather   . Thyroid disease Paternal Grandmother   . Stroke Paternal Grandfather     Social History   Socioeconomic History  . Marital status: Widowed    Spouse name: Not on file  . Number of children: 1  . Years of education: Not on file  . Highest education level: 12th grade  Occupational History  . Occupation: Retired  Scientific laboratory technician  . Financial resource strain: Not hard at all  . Food insecurity:    Worry: Never true    Inability: Never true  . Transportation needs:    Medical: No    Non-medical: No  Tobacco Use  . Smoking status: Never Smoker  . Smokeless tobacco: Never Used  . Tobacco comment: smoking cessation materials not required  Substance and Sexual Activity  . Alcohol use: No  . Drug use: No  . Sexual activity: Not Currently    Partners: Male  Lifestyle  . Physical activity:    Days per week: 0 days    Minutes per session: 0 min  . Stress: Not at all  Relationships  . Social connections:    Talks on phone: Patient refused    Gets together: Patient refused    Attends religious service: Patient refused    Active member of club or organization: Patient refused    Attends meetings of clubs or organizations: Patient refused    Relationship status: Widowed  . Intimate partner violence:    Fear of current or ex partner: No    Emotionally abused: No    Physically abused: No    Forced sexual activity: No  Other Topics Concern  . Not on file  Social History Narrative   Lives in [place]     Current Outpatient Medications:  .  acetaminophen (TYLENOL) 650 MG CR tablet, Take  1,300 mg by mouth every 8 (eight) hours as needed for pain., Disp: , Rfl:  .  amiodarone (PACERONE) 200 MG tablet, Take 1 tablet (200 mg total) by mouth daily., Disp: 90 tablet, Rfl: 3 .  apixaban (ELIQUIS) 5 MG TABS tablet, Take 1 tablet (5 mg total) by mouth 2 (two) times daily., Disp: 60 tablet, Rfl: 5 .  atorvastatin (LIPITOR) 20 MG tablet, TAKE 1 TABLET(20 MG) BY MOUTH DAILY AT 6 PM, Disp: 90 tablet, Rfl: 1 .  busPIRone (BUSPAR) 5 MG tablet, Take 1 tablet (5 mg total) by mouth 2 (two) times daily., Disp: 180 tablet, Rfl: 1 .  cholecalciferol (VITAMIN D) 1000 UNITS tablet, Take 2,000 Units by mouth daily., Disp: , Rfl:  .  doxazosin (CARDURA) 1 MG tablet, Take 1 tablet (1 mg total) by mouth daily., Disp: 30 tablet, Rfl: 11 .  ezetimibe (ZETIA) 10 MG tablet, Take  1 tablet (10 mg total) by mouth daily., Disp: 30 tablet, Rfl: 11 .  furosemide (LASIX) 20 MG tablet, Take 1 tablet (20 mg total) by mouth daily., Disp: 90 tablet, Rfl: 1 .  guaiFENesin (MUCINEX) 600 MG 12 hr tablet, Take 1 tablet (600 mg total) by mouth 2 (two) times daily., Disp: 60 tablet, Rfl: 0 .  levothyroxine (SYNTHROID, LEVOTHROID) 137 MCG tablet, TAKE 1 TABLET(137 MCG) BY MOUTH EVERY MORNING, Disp: 90 tablet, Rfl: 1 .  metoprolol succinate (TOPROL-XL) 50 MG 24 hr tablet, Take 1 tablet (50 mg total) by mouth 2 (two) times daily., Disp: , Rfl:  .  Multiple Vitamin (MULITIVITAMIN WITH MINERALS) TABS, Take 1 tablet by mouth daily., Disp: , Rfl:  .  potassium chloride (K-DUR) 10 MEQ tablet, Take 1 tablet (10 mEq total) by mouth daily., Disp: 90 tablet, Rfl: 1 .  temazepam (RESTORIL) 15 MG capsule, Take 1 capsule (15 mg total) by mouth at bedtime as needed for sleep., Disp: 30 capsule, Rfl: 2 .  valsartan (DIOVAN) 320 MG tablet, Take 1 tablet (320 mg total) by mouth daily., Disp: 90 tablet, Rfl: 1  Allergies  Allergen Reactions  . Codeine Nausea And Vomiting  . Erythromycin Itching and Swelling  . Septra  [Sulfamethoxazole-Trimethoprim] Swelling  . Penicillins Swelling and Rash    Has patient had a PCN reaction causing immediate rash, facial/tongue/throat swelling, SOB or lightheadedness with hypotension: Yes Has patient had a PCN reaction causing severe rash involving mucus membranes or skin necrosis: No Has patient had a PCN reaction that required hospitalization: No Has patient had a PCN reaction occurring within the last 10 years: No If all of the above answers are "NO", then may proceed with Cephalosporin use.      ROS  Constitutional: Negative for fever or weight change.  Respiratory: Negative for cough mild shortness of breath.   Cardiovascular: Negative for chest pain or palpitations.  Gastrointestinal: Negative for abdominal pain, no bowel changes.  Musculoskeletal: Negative for gait problem or joint swelling.  Skin: Negative for rash.  Neurological: Negative for dizziness or headache.  No other specific complaints in a complete review of systems (except as listed in HPI above).  Objective  Vitals:   05/17/18 0918  BP: (!) 150/76  Pulse: 63  Resp: 16  Temp: 97.8 F (36.6 C)  TempSrc: Oral  SpO2: 97%  Weight: 149 lb 12.8 oz (67.9 kg)  Height: 5' 6"  (1.676 m)    Body mass index is 24.18 kg/m.  Physical Exam  Constitutional: Patient appears well-developed and well-nourished.  No distress.  HEENT: head atraumatic, normocephalic, pupils equal and reactive to light, neck supple, throat within normal limits Cardiovascular: Normal rate, some extra beats but not in afib .  No murmur heard, she has a split second sound . No BLE edema. Pulmonary/Chest: Effort normal and breath sounds normal. No respiratory distress. Abdominal: Soft.  There is no tenderness. Psychiatric: Patient has a normal mood and affect. behavior is normal. Judgment and thought content normal.  Recent Results (from the past 2160 hour(s))  Brain natriuretic peptide     Status: Abnormal   Collection  Time: 03/21/18  8:16 AM  Result Value Ref Range   B Natriuretic Peptide 336.0 (H) 0.0 - 100.0 pg/mL    Comment: Performed at [facility], [address] [place], [place] [address]  Basic metabolic panel     Status: Abnormal   Collection Time: 03/21/18  8:16 AM  Result Value Ref Range  Sodium 139 135 - 145 mmol/L   Potassium 3.5 3.5 - 5.1 mmol/L   Chloride 104 101 - 111 mmol/L   CO2 26 22 - 32 mmol/L   Glucose, Bld 145 (H) 65 - 99 mg/dL   BUN 15 6 - 20 mg/dL   Creatinine, Ser 0.78 0.44 - 1.00 mg/dL   Calcium 9.1 8.9 - 10.3 mg/dL   GFR calc non Af Amer >60 >60 mL/min   GFR calc Af Amer >60 >60 mL/min    Comment: (NOTE) The eGFR has been calculated using the CKD EPI equation. This calculation has not been validated in all clinical situations. eGFR's persistently <60 mL/min signify possible Chronic Kidney Disease.    Anion gap 9 5 - 15    Comment: Performed at [facility], [address] [place], [place] [address]  CBC with Differential/Platelet     Status: None   Collection Time: 03/21/18  8:16 AM  Result Value Ref Range   WBC 6.5 3.6 - 11.0 K/uL   RBC 3.81 3.80 - 5.20 MIL/uL   Hemoglobin 12.3 12.0 - 16.0 g/dL   HCT 35.4 35.0 - 47.0 %   MCV 93.0 80.0 - 100.0 fL   MCH 32.2 26.0 - 34.0 pg   MCHC 34.6 32.0 - 36.0 g/dL   RDW 14.1 11.5 - 14.5 %   Platelets 299 150 - 440 K/uL   Neutrophils Relative % 74 %   Neutro Abs 4.7 1.4 - 6.5 K/uL   Lymphocytes Relative 17 %   Lymphs Abs 1.1 1.0 - 3.6 K/uL   Monocytes Relative 8 %   Monocytes Absolute 0.5 0.2 - 0.9 K/uL   Eosinophils Relative 1 %   Eosinophils Absolute 0.1 0 - 0.7 K/uL   Basophils Relative 0 %   Basophils Absolute 0.0 0 - 0.1 K/uL    Comment: Performed at [facility], [address] [place], [place] [address]  Hepatic function panel     Status: None   Collection Time: 03/21/18  8:16 AM  Result Value Ref Range   Total Protein 7.1 6.5 - 8.1 g/dL   Albumin 4.3 3.5 - 5.0 g/dL   AST 29  15 - 41 U/L   ALT 25 14 - 54 U/L   Alkaline Phosphatase 65 38 - 126 U/L   Total Bilirubin 0.8 0.3 - 1.2 mg/dL   Bilirubin, Direct 0.1 0.1 - 0.5 mg/dL   Indirect Bilirubin 0.7 0.3 - 0.9 mg/dL    Comment: Performed at [facility], [address] [place], [place] [address]  Basic metabolic panel     Status: Abnormal   Collection Time: 03/25/18  5:37 AM  Result Value Ref Range   Sodium 137 135 - 145 mmol/L   Potassium 3.6 3.5 - 5.1 mmol/L   Chloride 106 101 - 111 mmol/L   CO2 22 22 - 32 mmol/L   Glucose, Bld 163 (H) 65 - 99 mg/dL   BUN 21 (H) 6 - 20 mg/dL   Creatinine, Ser 0.79 0.44 - 1.00 mg/dL   Calcium 8.9 8.9 - 10.3 mg/dL   GFR calc non Af Amer >60 >60 mL/min   GFR calc Af Amer >60 >60 mL/min    Comment: (NOTE) The eGFR has been calculated using the CKD EPI equation. This calculation has not been validated in all clinical situations. eGFR's persistently <60 mL/min signify possible Chronic Kidney Disease.    Anion gap 9 5 - 15    Comment: Performed at [facility], [address] [place], [place] [address]  CBC     Status: None   Collection Time: 03/25/18  5:37 AM  Result Value Ref Range   WBC 9.6 3.6 - 11.0 K/uL   RBC 4.02 3.80 - 5.20 MIL/uL   Hemoglobin 12.8 12.0 - 16.0 g/dL   HCT 36.9 35.0 - 47.0 %   MCV 91.8 80.0 - 100.0 fL   MCH 31.9 26.0 - 34.0 pg   MCHC 34.7 32.0 - 36.0 g/dL   RDW 14.0 11.5 - 14.5 %   Platelets 322 150 - 440 K/uL    Comment: Performed at [facility], [address] [place], [place] [address]  Troponin I     Status: None   Collection Time: 03/25/18  5:37 AM  Result Value Ref Range   Troponin I <0.03 <0.03 ng/mL    Comment: Performed at [facility], [address] [place], [place] [address]  Brain natriuretic peptide     Status: Abnormal   Collection Time: 03/25/18  5:54 AM  Result Value Ref Range   B Natriuretic Peptide 452.0 (H) 0.0 - 100.0 pg/mL    Comment: Performed at [facility], [address] [place], [place] [address]  Troponin I     Status: None   Collection Time: 03/25/18  8:29 AM  Result Value Ref Range   Troponin I <0.03 <0.03 ng/mL    Comment: Performed at [facility], [address] [place], [place] [address]  Troponin I     Status: None   Collection Time: 03/25/18 12:58 PM  Result Value Ref Range   Troponin I <0.03 <0.03 ng/mL    Comment: Performed at [facility], [address] [place], [place] [address]  Troponin I     Status: None   Collection Time: 03/25/18  7:51 PM  Result Value Ref Range   Troponin I <0.03 <0.03 ng/mL    Comment: Performed at [facility], [address] [place], [place] [address]  Basic metabolic panel     Status: Abnormal   Collection Time: 03/26/18  3:46 AM  Result Value Ref Range   Sodium 138 135 - 145 mmol/L   Potassium 3.4 (L) 3.5 - 5.1 mmol/L   Chloride 103 101 - 111 mmol/L   CO2 24 22 - 32 mmol/L   Glucose, Bld 135 (H) 65 - 99 mg/dL   BUN 21 (H) 6 - 20 mg/dL   Creatinine, Ser 0.78 0.44 - 1.00 mg/dL   Calcium 8.8 (L) 8.9 - 10.3 mg/dL   GFR calc non Af Amer >60 >60 mL/min   GFR calc Af Amer >60 >60 mL/min    Comment: (NOTE) The eGFR has been calculated using the CKD EPI equation. This calculation has not been validated in all clinical situations. eGFR's persistently <60 mL/min signify possible Chronic Kidney Disease.    Anion gap 11 5 - 15    Comment: Performed at [facility], [address] [place], [place] [address]  CBC     Status: None   Collection Time: 03/26/18  3:46 AM  Result Value Ref Range   WBC 10.0 3.6 - 11.0 K/uL   RBC 3.89 3.80 - 5.20 MIL/uL   Hemoglobin 12.2 12.0 - 16.0 g/dL   HCT 35.9 35.0 - 47.0 %   MCV 92.1 80.0 - 100.0 fL   MCH 31.4 26.0 - 34.0 pg   MCHC 34.1 32.0 - 36.0 g/dL   RDW 14.1 11.5 -  14.5 %   Platelets 301 150 - 440 K/uL    Comment: Performed at [facility], [address] [place], [place] [address]  Glucose, capillary      Status: Abnormal   Collection Time: 03/26/18  7:43 AM  Result Value Ref Range   Glucose-Capillary 130 (H) 65 - 99 mg/dL   Comment 1 Notify RN   Glucose, capillary     Status: Abnormal   Collection Time: 03/27/18  7:26 AM  Result Value Ref Range   Glucose-Capillary 127 (H) 65 - 99 mg/dL   Comment 1 Notify RN   COMPLETE METABOLIC PANEL WITH GFR     Status: Abnormal   Collection Time: 04/05/18  9:40 AM  Result Value Ref Range   Glucose, Bld 150 (H) 65 - 139 mg/dL    Comment: .        Non-fasting reference interval .    BUN 18 7 - 25 mg/dL   Creat 1.01 (H) 0.60 - 0.93 mg/dL    Comment: For patients >70 years of age, the reference limit for Creatinine is approximately 13% higher for people identified as African-American. .    GFR, Est Non African American 54 (L) > OR = 60 mL/min/1.54m   GFR, Est African American 63 > OR = 60 mL/min/1.773m  BUN/Creatinine Ratio 18 6 - 22 (calc)   Sodium 138 135 - 146 mmol/L   Potassium 3.9 3.5 - 5.3 mmol/L   Chloride 103 98 - 110 mmol/L   CO2 26 20 - 32 mmol/L   Calcium 9.3 8.6 - 10.4 mg/dL   Total Protein 6.8 6.1 - 8.1 g/dL   Albumin 4.6 3.6 - 5.1 g/dL   Globulin 2.2 1.9 - 3.7 g/dL (calc)   AG Ratio 2.1 1.0 - 2.5 (calc)   Total Bilirubin 0.7 0.2 - 1.2 mg/dL   Alkaline phosphatase (APISO) 65 33 - 130 U/L   AST 25 10 - 35 U/L   ALT 23 6 - 29 U/L  TSH     Status: None   Collection Time: 04/05/18  9:40 AM  Result Value Ref Range   TSH 4.13 0.40 - 4.50 mIU/L  Comprehensive metabolic panel     Status: Abnormal   Collection Time: 04/22/18  6:16 AM  Result Value Ref Range   Sodium 138 135 - 145 mmol/L   Potassium 3.3 (L) 3.5 - 5.1 mmol/L   Chloride 105 98 - 111 mmol/L    Comment: Please note change in reference range.   CO2 23 22 - 32 mmol/L   Glucose, Bld 224 (H) 70 - 99 mg/dL    Comment: Please note change in reference range.   BUN 19 8 - 23 mg/dL    Comment: Please note change in reference range.   Creatinine, Ser 0.95 0.44 - 1.00  mg/dL   Calcium 8.9 8.9 - 10.3 mg/dL   Total Protein 6.9 6.5 - 8.1 g/dL   Albumin 4.4 3.5 - 5.0 g/dL   AST 34 15 - 41 U/L   ALT 29 0 - 44 U/L    Comment: Please note change in reference range.   Alkaline Phosphatase 64 38 - 126 U/L   Total Bilirubin 0.7 0.3 - 1.2 mg/dL   GFR calc non Af Amer 57 (L) >60 mL/min   GFR calc Af Amer >60 >60 mL/min    Comment: (NOTE) The eGFR has been calculated using the CKD EPI equation. This calculation has not been validated in all clinical situations.  eGFR's persistently <60 mL/min signify possible Chronic Kidney Disease.    Anion gap 10 5 - 15    Comment: Performed at [facility], [address] [place], [place] [address]  Brain natriuretic peptide     Status: Abnormal   Collection Time: 04/22/18  6:16 AM  Result Value Ref Range   B Natriuretic Peptide 614.0 (H) 0.0 - 100.0 pg/mL    Comment: Performed at [facility], [address] [place], [place] [address]  Troponin I     Status: None   Collection Time: 04/22/18  6:16 AM  Result Value Ref Range   Troponin I <0.03 <0.03 ng/mL    Comment: Performed at [facility], [address] [place], [place] [address]  CBC with Differential     Status: Abnormal   Collection Time: 04/22/18  6:16 AM  Result Value Ref Range   WBC 8.0 3.6 - 11.0 K/uL   RBC 4.22 3.80 - 5.20 MIL/uL   Hemoglobin 13.2 12.0 - 16.0 g/dL   HCT 38.3 35.0 - 47.0 %   MCV 90.6 80.0 - 100.0 fL   MCH 31.2 26.0 - 34.0 pg   MCHC 34.4 32.0 - 36.0 g/dL   RDW 15.6 (H) 11.5 - 14.5 %   Platelets 341 150 - 440 K/uL   Neutrophils Relative % 75 %   Neutro Abs 6.1 1.4 - 6.5 K/uL   Lymphocytes Relative 18 %   Lymphs Abs 1.4 1.0 - 3.6 K/uL   Monocytes Relative 5 %   Monocytes Absolute 0.4 0.2 - 0.9 K/uL   Eosinophils Relative 1 %   Eosinophils Absolute 0.1 0 - 0.7 K/uL   Basophils Relative 1 %   Basophils Absolute 0.0 0 - 0.1 K/uL    Comment: Performed at [facility], [address]  [place], [place] [address]  Magnesium     Status: None   Collection Time: 04/22/18  6:16 AM  Result Value Ref Range   Magnesium 2.2 1.7 - 2.4 mg/dL    Comment: Performed at [facility], [address] [place], [place] [address]  MRSA PCR Screening     Status: None   Collection Time: 04/22/18  8:29 AM  Result Value Ref Range   MRSA by PCR NEGATIVE NEGATIVE    Comment:        The GeneXpert MRSA Assay (FDA approved for NASAL specimens only), is one component of a comprehensive MRSA colonization surveillance program. It is not intended to diagnose MRSA infection nor to guide or monitor treatment for MRSA infections. Performed at [facility], [address] [place], [place] [address]   Glucose, capillary     Status: Abnormal   Collection Time: 04/22/18  8:29 AM  Result Value Ref Range   Glucose-Capillary 123 (H) 70 - 99 mg/dL  Basic metabolic panel     Status: Abnormal   Collection Time: 04/23/18  4:24 AM  Result Value Ref Range   Sodium 136 135 - 145 mmol/L   Potassium 3.2 (L) 3.5 - 5.1 mmol/L   Chloride 99 98 - 111 mmol/L    Comment: Please note change in reference range.   CO2 25 22 - 32 mmol/L   Glucose, Bld 130 (H) 70 - 99 mg/dL    Comment: Please note change in reference range.   BUN 19 8 - 23 mg/dL    Comment: Please note change in reference range.   Creatinine, Ser 0.86 0.44 - 1.00 mg/dL  Calcium 8.7 (L) 8.9 - 10.3 mg/dL   GFR calc non Af Amer >60 >60 mL/min   GFR calc Af Amer >60 >60 mL/min    Comment: (NOTE) The eGFR has been calculated using the CKD EPI equation. This calculation has not been validated in all clinical situations. eGFR's persistently <60 mL/min signify possible Chronic Kidney Disease.    Anion gap 12 5 - 15    Comment: Performed at [facility], [address] [place], [place] [address]  CBC     Status: Abnormal   Collection Time: 04/23/18  4:24 AM  Result Value Ref Range   WBC 9.0 3.6 - 11.0 K/uL   RBC 4.12  3.80 - 5.20 MIL/uL   Hemoglobin 12.7 12.0 - 16.0 g/dL   HCT 37.1 35.0 - 47.0 %   MCV 90.1 80.0 - 100.0 fL   MCH 30.7 26.0 - 34.0 pg   MCHC 34.1 32.0 - 36.0 g/dL   RDW 15.4 (H) 11.5 - 14.5 %   Platelets 306 150 - 440 K/uL    Comment: Performed at [facility], [address], [place], [place] [address]  Basic metabolic panel     Status: Abnormal   Collection Time: 04/24/18  5:15 AM  Result Value Ref Range   Sodium 138 135 - 145 mmol/L   Potassium 4.2 3.5 - 5.1 mmol/L   Chloride 102 98 - 111 mmol/L    Comment: Please note change in reference range.   CO2 25 22 - 32 mmol/L   Glucose, Bld 141 (H) 70 - 99 mg/dL    Comment: Please note change in reference range.   BUN 25 (H) 8 - 23 mg/dL    Comment: Please note change in reference range.   Creatinine, Ser 1.05 (H) 0.44 - 1.00 mg/dL   Calcium 9.2 8.9 - 10.3 mg/dL   GFR calc non Af Amer 51 (L) >60 mL/min   GFR calc Af Amer 59 (L) >60 mL/min    Comment: (NOTE) The eGFR has been calculated using the CKD EPI equation. This calculation has not been validated in all clinical situations. eGFR's persistently <60 mL/min signify possible Chronic Kidney Disease.    Anion gap 11 5 - 15    Comment: Performed at [facility], [address] [place], [place] [address]  CBC WITH DIFFERENTIAL     Status: Abnormal   Collection Time: 04/24/18  5:15 AM  Result Value Ref Range   WBC 8.8 3.6 - 11.0 K/uL   RBC 4.38 3.80 - 5.20 MIL/uL   Hemoglobin 13.3 12.0 - 16.0 g/dL   HCT 38.9 35.0 - 47.0 %   MCV 88.9 80.0 - 100.0 fL   MCH 30.3 26.0 - 34.0 pg   MCHC 34.1 32.0 - 36.0 g/dL   RDW 15.2 (H) 11.5 - 14.5 %   Platelets 319 150 - 440 K/uL   Neutrophils Relative % 70 %   Neutro Abs 6.2 1.4 - 6.5 K/uL   Lymphocytes Relative 20 %   Lymphs Abs 1.7 1.0 - 3.6 K/uL   Monocytes Relative 9 %   Monocytes Absolute 0.8 0.2 - 0.9 K/uL   Eosinophils Relative 1 %   Eosinophils Absolute 0.1 0 - 0.7 K/uL   Basophils Relative 0 %   Basophils  Absolute 0.0 0 - 0.1 K/uL    Comment: Performed at [facility], [address] [place], [place] [address]  Basic metabolic panel     Status: Abnormal   Collection  Time: 04/25/18  4:25 AM  Result Value Ref Range   Sodium 138 135 - 145 mmol/L   Potassium 4.3 3.5 - 5.1 mmol/L   Chloride 104 98 - 111 mmol/L    Comment: Please note change in reference range.   CO2 28 22 - 32 mmol/L   Glucose, Bld 124 (H) 70 - 99 mg/dL    Comment: Please note change in reference range.   BUN 31 (H) 8 - 23 mg/dL    Comment: Please note change in reference range.   Creatinine, Ser 1.13 (H) 0.44 - 1.00 mg/dL   Calcium 8.9 8.9 - 10.3 mg/dL   GFR calc non Af Amer 46 (L) >60 mL/min   GFR calc Af Amer 54 (L) >60 mL/min    Comment: (NOTE) The eGFR has been calculated using the CKD EPI equation. This calculation has not been validated in all clinical situations. eGFR's persistently <60 mL/min signify possible Chronic Kidney Disease.    Anion gap 6 5 - 15    Comment: Performed at [facility], [address] [place], [place] [address]    PHQ2/9: Depression screen [facility] 2/9 05/17/2018 05/17/2018 04/05/2018 12/04/2017 09/03/2017  Decreased Interest 0 0 0 0 0  Down, Depressed, Hopeless 0 0 0 0 0  PHQ - 2 Score 0 0 0 0 0  Altered sleeping 0 2 2 - -  Tired, decreased energy 0 1 2 - -  Change in appetite 0 0 0 - -  Feeling bad or failure about yourself  0 0 0 - -  Trouble concentrating 0 0 0 - -  Moving slowly or fidgety/restless 0 0 0 - -  Suicidal thoughts 0 0 0 - -  PHQ-9 Score 0 3 4 - -  Difficult doing work/chores Not difficult at all Somewhat difficult Somewhat difficult - -     Fall Risk: Fall Risk  05/17/2018 05/17/2018 04/05/2018 12/19/2017 12/04/2017  Falls in the past year? No No No No No  Number falls in past yr: - - - - -  Injury with Fall? - - - - -  [place] for fall due to : Impaired vision;Other (Comment);Medication side effect - - - -  Risk for fall due to:  Comment contacts; R cataract; fatigue, dyspnea; s/p cardioversion - - - -  Follow up - - - - -     Assessment & Plan  1. Chronic a-fib ([place])  Doing well since last ablation   2. Adjustment insomnia  - temazepam (RESTORIL) 15 MG capsule; Take 1 capsule (15 mg total) by mouth at bedtime as needed for sleep.  Dispense: 30 capsule; Refill: 2  3. Pulmonary hypertension ([place])  Under the care of cardiologist   4. Essential hypertension  - COMPLETE METABOLIC PANEL WITH GFR - CBC with Differential/Platelet  5. Chronic combined systolic (congestive) and diastolic (congestive) heart failure (HCC)  Stable at this time  6. GAD (generalized anxiety disorder)  Doing better with Buspar   7. Mixed hyperlipidemia  - Lipid panel  8. Hypothyroidism, unspecified type  - TSH  9. Mild stress incontinence  Discussed kegel exercises  10. Chronic neck pain  She is seeing Dr. Larose Hires but also going to have injections done in 05/2018.

## 2018-05-17 NOTE — Progress Notes (Addendum)
Subjective:   [name] is a 77 y.o. female who presents for Medicare Annual (Subsequent) preventive examination.  Review of Systems:  N/A Cardiac Risk Factors include: advanced age (>37men, >8 women);dyslipidemia;hypertension;sedentary lifestyle;Other (see comment), Risk factor comments: s/p cardioversion x2     Objective:     Vitals: BP (!) 148/50 (BP Location: Left Arm, Cuff Size: Normal)   Pulse 64   Temp 97.8 F (36.6 C) (Oral)   Resp 16   Ht 5\' 6"  (1.676 m)   Wt 149 lb 12.8 oz (67.9 kg)   SpO2 97%   BMI 24.18 kg/m   Body mass index is 24.18 kg/m.   B/P rechecked at the end of visit. 148/50, L arm, sitting, normal cuff, manual.  Advanced Directives 05/17/2018 04/24/2018 04/22/2018 03/25/2018 03/25/2018 05/17/2017 03/12/2017  Does Patient Have a Medical Advance Directive? Yes No No Yes Yes No Yes  Type of Paramedic of [place];Living will - - Living will Living will - [place];Living will  Does patient want to make changes to medical advance directive? - - - No - Patient declined - - -  Copy of [place] in Chart? No - copy requested - - - - - No - copy requested  Would patient like information on creating a medical advance directive? - No - Patient declined No - Patient declined - - - -    Tobacco Social History   Tobacco Use  Smoking Status Never Smoker  Smokeless Tobacco Never Used  Tobacco Comment   smoking cessation materials not required     Counseling given: No Comment: smoking cessation materials not required  Clinical Intake:  Pre-visit preparation completed: Yes  Pain : No/denies pain   BMI - recorded: 24.18 Nutritional Status: BMI of 19-24  Normal Nutritional Risks: None Diabetes: No  How often do you need to have someone help you when you read instructions, pamphlets, or other written materials from your doctor or pharmacy?: 1 - Never  Interpreter Needed?: No  Information entered  by :: AEversole, LPN  Past Medical History:  Diagnosis Date  . Bronchitis   . Chronic combined systolic (congestive) and diastolic (congestive) heart failure ([place])    a. 02/2018 Echo: EF 45-50%, diff HK, triv AI, mod MR, mildly dil LA/RA, nl RV fxn, mod TR. PASP 40-2mmHg.  Marland Kitchen DJD (degenerative joint disease), cervical   . History of stress test    a. 01/2008 MV: EF 76%. Fair ex tol. No ischemia.  . Hyperlipidemia   . Hypertension   . Hypothyroid   . LBBB (left bundle branch block)   . Leg pain    a. 01/2018 ABI's wnl.  . Persistent atrial fibrillation ([place])    a. 01/2018 Event monitor: 45 runs of SVT, longes 14.5 sec. SVT felt to be Afib/flutter-->2% burden. Longest run of AF 4h 19m (CHA2DS2VASc = 4-->Xarelto).  . 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 CARDIOVERSION N/A 03/26/2018   Procedure: CARDIOVERSION;  Surgeon: Nelva Bush, MD;  Location: [facility] ORS;  Service: Cardiovascular;  Laterality: N/A;  . CARDIOVERSION N/A 04/24/2018   Procedure: CARDIOVERSION;  Surgeon: Minna Merritts, MD;  Location: [facility] ORS;  Service: Cardiovascular;  Laterality: N/A;  . ELECTROPHYSIOLOGY STUDY N/A 02/27/2012   Procedure: ELECTROPHYSIOLOGY STUDY;  Surgeon: Evans Lance, MD;  [facility] C[facility] LAB;  Service: Cardiovascular;  Laterality:  N/A;  . EPS and ablation for SVT  5/13   slow pathway ablation by Dr Lovena Le  . EYE SURGERY     surgery for detatched retina  . SUPRAVENTRICULAR TACHYCARDIA ABLATION N/A 02/27/2012   Procedure: SUPRAVENTRICULAR TACHYCARDIA ABLATION;  Surgeon: Evans Lance, MD;  Location: [facility] CATH LAB;  Service: Cardiovascular;  Laterality: N/A;   Family History  Problem Relation Age of Onset  . Alzheimer's disease Mother   . Stroke Father   . Breast cancer Father   . Heart disease Father        Pacemaker  . Breast cancer Paternal Aunt   . Kidney cancer Maternal Grandmother   .  Alzheimer's disease Maternal Grandfather   . Thyroid disease Paternal Grandmother   . Stroke Paternal Grandfather    Social History   Socioeconomic History  . Marital status: Widowed    Spouse name: Not on file  . Number of children: 1  . Years of education: Not on file  . Highest education level: 12th grade  Occupational History  . Occupation: Retired  Scientific laboratory technician  . Financial resource strain: Not hard at all  . Food insecurity:    Worry: Never true    Inability: Never true  . Transportation needs:    Medical: No    Non-medical: No  Tobacco Use  . Smoking status: Never Smoker  . Smokeless tobacco: Never Used  . Tobacco comment: smoking cessation materials not required  Substance and Sexual Activity  . Alcohol use: No  . Drug use: No  . Sexual activity: Not Currently    Partners: Male  Lifestyle  . Physical activity:    Days per week: 0 days    Minutes per session: 0 min  . Stress: Not at all  Relationships  . Social connections:    Talks on phone: Patient refused    Gets together: Patient refused    Attends religious service: Patient refused    Active member of club or organization: Patient refused    Attends meetings of clubs or organizations: Patient refused    Relationship status: Widowed  Other Topics Concern  . Not on file  Social History Narrative   Lives in [place]    Outpatient Encounter Medications as of 05/17/2018  Medication Sig  . acetaminophen (TYLENOL) 650 MG CR tablet Take 1,300 mg by mouth every 8 (eight) hours as needed for pain.  Marland Kitchen amiodarone (PACERONE) 200 MG tablet Take 1 tablet (200 mg total) by mouth daily.  Marland Kitchen apixaban (ELIQUIS) 5 MG TABS tablet Take 1 tablet (5 mg total) by mouth 2 (two) times daily.  Marland Kitchen atorvastatin (LIPITOR) 20 MG tablet TAKE 1 TABLET(20 MG) BY MOUTH DAILY AT 6 PM  . busPIRone (BUSPAR) 5 MG tablet Take 1 tablet (5 mg total) by mouth 2 (two) times daily.  . cholecalciferol (VITAMIN D) 1000 UNITS tablet Take 2,000  Units by mouth daily.  Marland Kitchen doxazosin (CARDURA) 1 MG tablet Take 1 tablet (1 mg total) by mouth daily.  Marland Kitchen ezetimibe (ZETIA) 10 MG tablet Take 1 tablet (10 mg total) by mouth daily.  . furosemide (LASIX) 20 MG tablet Take 1 tablet (20 mg total) by mouth daily.  Marland Kitchen guaiFENesin (MUCINEX) 600 MG 12 hr tablet Take 1 tablet (600 mg total) by mouth 2 (two) times daily.  Marland Kitchen levothyroxine (SYNTHROID, LEVOTHROID) 137 MCG tablet TAKE 1 TABLET(137 MCG) BY MOUTH EVERY MORNING  . metoprolol succinate (TOPROL-XL) 50 MG 24 hr tablet Take 1 tablet (50 mg total)  by mouth 2 (two) times daily.  . Multiple Vitamin (MULITIVITAMIN WITH MINERALS) TABS Take 1 tablet by mouth daily.  . potassium chloride (K-DUR) 10 MEQ tablet Take 1 tablet (10 mEq total) by mouth daily.  . temazepam (RESTORIL) 15 MG capsule Take 1 capsule (15 mg total) by mouth at bedtime as needed for sleep.  . valsartan (DIOVAN) 320 MG tablet Take 1 tablet (320 mg total) by mouth daily.   No facility-administered encounter medications on file as of 05/17/2018.     Activities of Daily Living In your present state of health, do you have any difficulty performing the following activities: 05/17/2018 04/22/2018  Hearing? N N  Comment denies hearing aids -  Vision? N N  Comment contacts; R cataract -  Difficulty concentrating or making decisions? N N  Walking or climbing stairs? Y N  Comment fatigue, dyspnea, back pain -  Dressing or bathing? N N  Doing errands, shopping? N N  Preparing Food and eating ? N -  Comment denies dentures -  Using the Toilet? N -  In the past six months, have you accidently leaked urine? Y -  Comment stress incontinence -  Do you have problems with loss of bowel control? N -  Managing your Medications? N -  Managing your Finances? N -  Housekeeping or managing your Housekeeping? N -  Some recent data might be hidden    Patient Care Team: Steele Sizer, MD as PCP - General (Family Medicine) Minna Merritts, MD as PCP  - Cardiology (Cardiology) Romine, Richard Miu, DO as Consulting Physician (Optometry) Dingeldein, Remo Lipps, MD as Consulting Physician (Ophthalmology) Alisa Graff, FNP as Consulting Physician (Family Medicine)    Assessment:   This is a routine wellness examination for [name].  Exercise Activities and Dietary recommendations Current Exercise Habits: The patient does not participate in regular exercise at present, Exercise limited by: cardiac condition(s)(s/p cardioversion)  Goals    . Exercise 150 min/wk Moderate Activity     Recommend to exercise for at least 150 minutes per week.       Fall Risk Fall Risk  05/17/2018 05/17/2018 04/05/2018 12/19/2017 12/04/2017  Falls in the past year? No No No No No  Number falls in past yr: - - - - -  Injury with Fall? - - - - -  [place] for fall due to : Impaired vision;Other (Comment);Medication side effect - - - -  Risk for fall due to: Comment contacts; R cataract; fatigue, dyspnea; s/p cardioversion - - - -  Follow up - - - - -   FALL RISK PREVENTION PERTAINING TO HOME: Is your home free of loose throw rugs in walkways, pet beds, electrical cords, etc? Yes Is there adequate lighting in your home to reduce risk of falls?  Yes Are there stairs in or around your home WITH handrails? Yes  ASSISTIVE DEVICES UTILIZED TO PREVENT FALLS: Use of a cane, walker or w/c? No Grab bars in the bathroom? Yes  Shower chair or a place to sit while bathing? No An elevated toilet seat or a handicapped toilet? Yes  Timed Get Up and Go Performed: Yes. Pt ambulated 10 feet within 7 sec. Gait stead-fast and without the use of an assistive device. No intervention required at this time. Fall risk prevention has been discussed.  Community Resource Referral:  Pt declined my offer to send [employer] Referral to Care Guide for a shower chair.  Depression Screen  PHQ 2/9 Scores 05/17/2018 05/17/2018 04/05/2018 12/04/2017  PHQ - 2 Score 0 0 0 0  PHQ-  9 Score 0 3 4 -     Cognitive Function     6CIT Screen 05/17/2018 03/12/2017  What Year? 0 points 0 points  What month? 0 points 0 points  What time? 0 points 0 points  Count back from 20 0 points 0 points  Months in reverse 0 points 0 points  Repeat phrase 0 points 0 points  Total Score 0 0    Immunization History  Administered Date(s) Administered  . Influenza, High Dose Seasonal PF 07/22/2015  . Influenza-Unspecified 08/25/2016, 08/13/2017  . Pneumococcal Conjugate-13 09/07/2016  . Pneumococcal Polysaccharide-23 12/04/2017    Qualifies for Shingles Vaccine? Yes. Due for Shingrix. Education has been provided regarding the importance of this vaccine. Pt has been advised to call insurance company to determine out of pocket expense. Advised may also receive vaccine at local pharmacy or Health Dept. Verbalized acceptance and understanding.  Screening Tests Health Maintenance  Topic Date Due  . TETANUS/TDAP  12/04/2018 (Originally 10/23/2017)  . INFLUENZA VACCINE  05/23/2018  . COLONOSCOPY  10/23/2018  . DEXA SCAN  Completed  . PNA vac Low Risk Adult  Completed    Cancer Screenings: Lung: Low Dose CT Chest recommended if Age 58-80 years, 30 pack-year currently smoking OR have quit w/in 15years. Patient does not qualify. Breast:  Up to date on Mammogram? Yes. Completed 06/28/17. Declined my offer to order mammogram at this time. Recently underwent cardioversion on 04/24/18. Pt states she wants to "allow things to settle" before proceeding with repeat mammographies.   Up to date of Bone Density/Dexa? Yes. Completed 06/12/17. Results reflect osteoporosis. Repeat every 2 years Colorectal: Completed 10/23/08. Repeat every 10 years  Additional Screenings: Hepatitis C Screening: Does not qualify    Plan:  I have personally reviewed and addressed the Medicare Annual Wellness questionnaire and have noted the following in the patient's chart:  A. Medical and social history B. Use of alcohol,  tobacco or illicit drugs  C. Current medications and supplements D. Functional ability and status E.  Nutritional status F.  Physical activity G. Advance directives H. List of other physicians I.  Hospitalizations, surgeries, and ER visits in previous 12 months J.  [place] such as hearing and vision if needed, cognitive and depression L. Referrals and appointments  In addition, I have reviewed and discussed with patient certain preventive protocols, quality metrics, and best practice recommendations. A written personalized care plan for preventive services as well as general preventive health recommendations were provided to patient.  See attached scanned questionnaire for additional information.   Signed,  Aleatha Borer, LPN Nurse Health Advisor  I have reviewed this encounter including the documentation in this note and/or discussed this patient with the provider, Aleatha Borer, LPN. I am certifying that I agree with the content of this note as supervising physician.  Steele Sizer, MD [facility] 05/17/2018, 1:43 PM

## 2018-05-22 DIAGNOSIS — M542 Cervicalgia: Secondary | ICD-10-CM | POA: Diagnosis not present

## 2018-05-23 ENCOUNTER — Telehealth: Payer: Self-pay | Admitting: Cardiovascular Disease

## 2018-05-23 NOTE — Telephone Encounter (Signed)
Dr. Santiago Bur office calling States the medical clearance was for Xarelto but patient is now on Eliquis Surgery is scheduled for 8/14 and will need to know if patient can be cleared by then Will be faxing over medical clearance form Please advise

## 2018-05-23 NOTE — Telephone Encounter (Signed)
The patient was hospitalized from 7/1-7/419. She was admitted on xarelto, but her discharge summary states she was sent home on eliquis (I am unsure as to why this was changed).   DCCV done on 04/24/18.   To Dr. Rockey Situ to clarify that the patient is still ok to hold eliquis for upcoming procedure (cervical spine injection) on 06/05/18.

## 2018-05-23 NOTE — Telephone Encounter (Signed)
° °  [facility] Pre-operative Risk Assessment    Request for surgical clearance:  1. What type of surgery is being performed? Cervical Spine Epidural     2. When is this surgery scheduled? Not noted   3. What type of clearance is required (medical clearance vs. Pharmacy clearance to hold med vs. Both)? Pharmacy   4. Are there any medications that need to be held prior to surgery and how long? Eliquis 5 mg for 3-5 days    5. Practice name and name of physician performing surgery? [facility] Dr. Brien Few  6. What is your office phone number    7.   What is your office fax number  [phone]  8.   Anesthesia type (None, local, MAC, general) ? Not noted    [name] 05/23/2018, 1:40 PM  _________________________________________________________________   (provider comments below)

## 2018-05-24 ENCOUNTER — Encounter: Payer: Self-pay | Admitting: Family

## 2018-05-24 ENCOUNTER — Ambulatory Visit: Payer: Medicare Other | Attending: Family | Admitting: Family

## 2018-05-24 VITALS — BP 161/62 | HR 68 | Resp 18 | Ht 66.0 in | Wt 148.2 lb

## 2018-05-24 DIAGNOSIS — Z880 Allergy status to penicillin: Secondary | ICD-10-CM | POA: Insufficient documentation

## 2018-05-24 DIAGNOSIS — E785 Hyperlipidemia, unspecified: Secondary | ICD-10-CM | POA: Insufficient documentation

## 2018-05-24 DIAGNOSIS — R42 Dizziness and giddiness: Secondary | ICD-10-CM | POA: Diagnosis not present

## 2018-05-24 DIAGNOSIS — Z823 Family history of stroke: Secondary | ICD-10-CM | POA: Diagnosis not present

## 2018-05-24 DIAGNOSIS — Z885 Allergy status to narcotic agent status: Secondary | ICD-10-CM | POA: Diagnosis not present

## 2018-05-24 DIAGNOSIS — Z8249 Family history of ischemic heart disease and other diseases of the circulatory system: Secondary | ICD-10-CM | POA: Diagnosis not present

## 2018-05-24 DIAGNOSIS — Z803 Family history of malignant neoplasm of breast: Secondary | ICD-10-CM | POA: Insufficient documentation

## 2018-05-24 DIAGNOSIS — I110 Hypertensive heart disease with heart failure: Secondary | ICD-10-CM | POA: Diagnosis not present

## 2018-05-24 DIAGNOSIS — Z820 Family history of epilepsy and other diseases of the nervous system: Secondary | ICD-10-CM | POA: Insufficient documentation

## 2018-05-24 DIAGNOSIS — I5022 Chronic systolic (congestive) heart failure: Secondary | ICD-10-CM

## 2018-05-24 DIAGNOSIS — I480 Paroxysmal atrial fibrillation: Secondary | ICD-10-CM | POA: Insufficient documentation

## 2018-05-24 DIAGNOSIS — I509 Heart failure, unspecified: Secondary | ICD-10-CM | POA: Diagnosis present

## 2018-05-24 DIAGNOSIS — Z881 Allergy status to other antibiotic agents status: Secondary | ICD-10-CM | POA: Diagnosis not present

## 2018-05-24 DIAGNOSIS — I4891 Unspecified atrial fibrillation: Secondary | ICD-10-CM | POA: Insufficient documentation

## 2018-05-24 DIAGNOSIS — E039 Hypothyroidism, unspecified: Secondary | ICD-10-CM | POA: Insufficient documentation

## 2018-05-24 DIAGNOSIS — Z79890 Hormone replacement therapy (postmenopausal): Secondary | ICD-10-CM | POA: Diagnosis not present

## 2018-05-24 DIAGNOSIS — Z7901 Long term (current) use of anticoagulants: Secondary | ICD-10-CM | POA: Diagnosis not present

## 2018-05-24 DIAGNOSIS — Z882 Allergy status to sulfonamides status: Secondary | ICD-10-CM | POA: Insufficient documentation

## 2018-05-24 DIAGNOSIS — Z79899 Other long term (current) drug therapy: Secondary | ICD-10-CM | POA: Insufficient documentation

## 2018-05-24 DIAGNOSIS — I5042 Chronic combined systolic (congestive) and diastolic (congestive) heart failure: Secondary | ICD-10-CM | POA: Insufficient documentation

## 2018-05-24 DIAGNOSIS — I10 Essential (primary) hypertension: Secondary | ICD-10-CM

## 2018-05-24 DIAGNOSIS — I5032 Chronic diastolic (congestive) heart failure: Secondary | ICD-10-CM | POA: Insufficient documentation

## 2018-05-24 NOTE — Progress Notes (Signed)
Patient ID: [name], female    DOB: 03/11/1941, 77 y.o.   MRN: [id number]  HPI  Ms [name] is a 77 y/o female with a history of atrial fibrillation, hyperlipidemia, HTN, thyroid disease, SVT, DJD and chronic heart failure.   Echo report from 03/21/18 reviewed and showed an EF of 45-50% along with moderate MR.   Admitted 04/22/18 due to acute on chronic HF along with atrial fibrillation with RVR. Initially needed bipap and oxygen but was then weaned off oxygen. Cardiology consult obtained. Cardioverted in June and again, during this admission. Discharged after 3 days.   She presents today for her initial visit with a chief complaint of minimal fatigue upon moderate exertion. She says that this has been present for several months. She has associated light-headedness, difficulty sleeping and easy bruising along with this. She denies any abdominal distention, palpitations, pedal edema, chest pain, shortness of breath or weight gain.   Past Medical History:  Diagnosis Date  . Bronchitis   . Chronic combined systolic (congestive) and diastolic (congestive) heart failure ([place])    a. 02/2018 Echo: EF 45-50%, diff HK, triv AI, mod MR, mildly dil LA/RA, nl RV fxn, mod TR. PASP 40-37mmHg.  Marland Kitchen DJD (degenerative joint disease), cervical   . History of stress test    a. 01/2008 MV: EF 76%. Fair ex tol. No ischemia.  . Hyperlipidemia   . Hypertension   . Hypothyroid   . LBBB (left bundle branch block)   . Leg pain    a. 01/2018 ABI's wnl.  . Persistent atrial fibrillation ([place])    a. 01/2018 Event monitor: 45 runs of SVT, longes 14.5 sec. SVT felt to be Afib/flutter-->2% burden. Longest run of AF 4h 84m (CHA2DS2VASc = 4-->Xarelto).  . 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 CARDIOVERSION N/A 03/26/2018   Procedure: CARDIOVERSION;  Surgeon: Nelva Bush, MD;  Location: [facility] ORS;  Service:  Cardiovascular;  Laterality: N/A;  . CARDIOVERSION N/A 04/24/2018   Procedure: CARDIOVERSION;  Surgeon: Minna Merritts, MD;  Location: [facility] ORS;  Service: Cardiovascular;  Laterality: N/A;  . ELECTROPHYSIOLOGY STUDY N/A 02/27/2012   Procedure: ELECTROPHYSIOLOGY STUDY;  Surgeon: Evans Lance, MD;  Location: [facility] CATH LAB;  Service: Cardiovascular;  Laterality: N/A;  . EPS and ablation for SVT  5/13   slow pathway ablation by Dr Lovena Le  . EYE SURGERY     surgery for detatched retina  . SUPRAVENTRICULAR TACHYCARDIA ABLATION N/A 02/27/2012   Procedure: SUPRAVENTRICULAR TACHYCARDIA ABLATION;  Surgeon: Evans Lance, MD;  Location: [facility] CATH LAB;  Service: Cardiovascular;  Laterality: N/A;   Family History  Problem Relation Age of Onset  . Alzheimer's disease Mother   . Stroke Father   . Breast cancer Father   . Heart disease Father        Pacemaker  . Breast cancer Paternal Aunt   . Kidney cancer Maternal Grandmother   . Alzheimer's disease Maternal Grandfather   . Thyroid disease Paternal Grandmother   . Stroke Paternal Grandfather    Social History   Tobacco Use  . Smoking status: Never Smoker  . Smokeless tobacco: Never Used  . Tobacco comment: smoking cessation materials not required  Substance Use Topics  . Alcohol use: No   Allergies  Allergen Reactions  . Codeine Nausea And Vomiting  . Erythromycin Itching and Swelling  . Septra [Sulfamethoxazole-Trimethoprim] Swelling  .  Penicillins Swelling and Rash    Has patient had a PCN reaction causing immediate rash, facial/tongue/throat swelling, SOB or lightheadedness with hypotension: Yes Has patient had a PCN reaction causing severe rash involving mucus membranes or skin necrosis: No Has patient had a PCN reaction that required hospitalization: No Has patient had a PCN reaction occurring within the last 10 years: No If all of the above answers are "NO", then may proceed with Cephalosporin use.    Prior to Admission medications    Medication Sig Start Date End Date Taking? Authorizing Provider  acetaminophen (TYLENOL) 650 MG CR tablet Take 1,300 mg by mouth every 8 (eight) hours as needed for pain.   Yes [provider]  amiodarone (PACERONE) 200 MG tablet Take 1 tablet (200 mg total) by mouth daily. 05/07/18  Yes Gollan, Kathlene November, MD  apixaban (ELIQUIS) 5 MG TABS tablet Take 1 tablet (5 mg total) by mouth 2 (two) times daily. 05/17/18  Yes Sowles, Drue Stager, MD  atorvastatin (LIPITOR) 20 MG tablet TAKE 1 TABLET(20 MG) BY MOUTH DAILY AT 6 PM 04/06/18  Yes Sowles, Drue Stager, MD  busPIRone (BUSPAR) 5 MG tablet Take 1 tablet (5 mg total) by mouth 2 (two) times daily. 02/15/18  Yes Sowles, Drue Stager, MD  cholecalciferol (VITAMIN D) 1000 UNITS tablet Take 2,000 Units by mouth daily.   Yes [provider]  doxazosin (CARDURA) 1 MG tablet Take 1 tablet (1 mg total) by mouth daily. 05/07/18  Yes Minna Merritts, MD  ezetimibe (ZETIA) 10 MG tablet Take 1 tablet (10 mg total) by mouth daily. 01/29/18  Yes Gollan, Kathlene November, MD  furosemide (LASIX) 20 MG tablet Take 1 tablet (20 mg total) by mouth daily. 05/17/18  Yes Sowles, Drue Stager, MD  guaiFENesin (MUCINEX) 600 MG 12 hr tablet Take 1 tablet (600 mg total) by mouth 2 (two) times daily. 04/05/18  Yes Sowles, Drue Stager, MD  levothyroxine (SYNTHROID, LEVOTHROID) 137 MCG tablet TAKE 1 TABLET(137 MCG) BY MOUTH EVERY MORNING 04/06/18  Yes Sowles, Drue Stager, MD  metoprolol succinate (TOPROL-XL) 50 MG 24 hr tablet Take 1 tablet (50 mg total) by mouth 2 (two) times daily. 04/04/18  Yes Gollan, Kathlene November, MD  Multiple Vitamin (MULITIVITAMIN WITH MINERALS) TABS Take 1 tablet by mouth daily.   Yes [provider]  potassium chloride (K-DUR) 10 MEQ tablet Take 1 tablet (10 mEq total) by mouth daily. 05/17/18 07/16/18 Yes Sowles, Drue Stager, MD  temazepam (RESTORIL) 15 MG capsule Take 1 capsule (15 mg total) by mouth at bedtime as needed for sleep. 05/17/18  Yes Sowles, Drue Stager, MD  valsartan  (DIOVAN) 320 MG tablet Take 1 tablet (320 mg total) by mouth daily. 05/17/18 07/16/18 Yes Steele Sizer, MD    Review of Systems  Constitutional: Positive for fatigue (improving). Negative for appetite change.  HENT: Negative for congestion, postnasal drip and sore throat.   Eyes: Negative.   Respiratory: Negative for chest tightness and shortness of breath.   Cardiovascular: Negative for chest pain, palpitations and leg swelling.  Gastrointestinal: Negative for abdominal distention and abdominal pain.  Endocrine: Negative.   Genitourinary: Negative.   Musculoskeletal: Positive for neck pain.  Skin: Negative.   Allergic/Immunologic: Negative.   Neurological: Positive for light-headedness (on occasion). Negative for dizziness.  Hematological: Negative for adenopathy. Bruises/bleeds easily.  Psychiatric/Behavioral: Positive for sleep disturbance (due to neck pain). Negative for dysphoric mood. The patient is not nervous/anxious.    Vitals:   05/24/18 1304  BP: (!) 161/62  Pulse: 68  Resp: 18  SpO2: 99%  Weight: 148 lb 4 oz (67.2 kg)  Height: 5\' 6"  (1.676 m)   Wt Readings from Last 3 Encounters:  05/24/18 148 lb 4 oz (67.2 kg)  05/17/18 149 lb 12.8 oz (67.9 kg)  05/17/18 149 lb 12.8 oz (67.9 kg)   Lab Results  Component Value Date   CREATININE 1.13 (H) 04/25/2018   CREATININE 1.05 (H) 04/24/2018   CREATININE 0.86 04/23/2018    Physical Exam  Constitutional: She is oriented to person, place, and time. She appears well-developed and well-nourished.  HENT:  Head: Normocephalic and atraumatic.  Neck: Normal range of motion. Neck supple. No JVD present.  Cardiovascular: Normal rate and regular rhythm.  Pulmonary/Chest: Effort normal. No respiratory distress. She has no wheezes. She has no rales.  Abdominal: Soft. She exhibits no distension. There is no tenderness.  Musculoskeletal: She exhibits no edema or tenderness.  Neurological: She is alert and oriented to person, place,  and time.  Skin: Skin is warm and dry.  Psychiatric: She has a normal mood and affect. Her behavior is normal. Thought content normal.  Nursing note and vitals reviewed.  Assessment & Plan:  1: Chronic heart failure with reduced ejection fraction- - NYHA class II - euvolemic today - already weighing daily and she says that her weight has been stable. Reminded her to call for an overnight weight gain gain of >2 pounds or a weekly weight gain of >5 pounds - not adding salt and has been reading food labels. Reminded to closely follow a 2000mg  sodium diet and written dietary information was given to her about this - BNP 04/22/18 was 614.0  2: HTN- - BP elevated but she says that she checks it at home and it tends to run 140's/ 60's at home - saw PCP Ancil Boozer) 05/17/18 - BMP 04/25/18 reviewed and showed sodium 138, potassium 4.3 and GFR 46  3: Atrial fibrillation- - saw cardiology Rockey Situ) 05/07/18 - currently rate controlled  Medication list was reviewed.  Return in 6 weeks or sooner for any questions/problems before then.

## 2018-05-24 NOTE — Patient Instructions (Addendum)
Continue weighing daily and call for an overnight weight gain of > 2 pounds or a weekly weight gain of >5 pounds.  Drink between 60-64 ounces

## 2018-05-27 NOTE — Telephone Encounter (Signed)
Routed to number provided via EPIC fax.  

## 2018-05-27 NOTE — Telephone Encounter (Signed)
Ok to hold eliquis 3 days  Prior to injection 5 days if required by surgery Restart when approved by surgery

## 2018-06-05 DIAGNOSIS — M47812 Spondylosis without myelopathy or radiculopathy, cervical region: Secondary | ICD-10-CM | POA: Diagnosis not present

## 2018-06-26 ENCOUNTER — Encounter: Payer: Self-pay | Admitting: Emergency Medicine

## 2018-06-26 ENCOUNTER — Inpatient Hospital Stay
Admission: EM | Admit: 2018-06-26 | Discharge: 2018-06-28 | DRG: 683 | Disposition: A | Discharge status: Home / Self Care | Payer: Medicare Other | Attending: Internal Medicine | Admitting: Internal Medicine

## 2018-06-26 ENCOUNTER — Other Ambulatory Visit: Payer: Self-pay

## 2018-06-26 DIAGNOSIS — Z79899 Other long term (current) drug therapy: Secondary | ICD-10-CM | POA: Diagnosis not present

## 2018-06-26 DIAGNOSIS — E876 Hypokalemia: Secondary | ICD-10-CM | POA: Diagnosis not present

## 2018-06-26 DIAGNOSIS — Z7901 Long term (current) use of anticoagulants: Secondary | ICD-10-CM | POA: Diagnosis not present

## 2018-06-26 DIAGNOSIS — I5042 Chronic combined systolic (congestive) and diastolic (congestive) heart failure: Secondary | ICD-10-CM | POA: Diagnosis present

## 2018-06-26 DIAGNOSIS — I130 Hypertensive heart and chronic kidney disease with heart failure and stage 1 through stage 4 chronic kidney disease, or unspecified chronic kidney disease: Secondary | ICD-10-CM | POA: Diagnosis present

## 2018-06-26 DIAGNOSIS — G8929 Other chronic pain: Secondary | ICD-10-CM | POA: Diagnosis present

## 2018-06-26 DIAGNOSIS — T502X5A Adverse effect of carbonic-anhydrase inhibitors, benzothiadiazides and other diuretics, initial encounter: Secondary | ICD-10-CM | POA: Diagnosis not present

## 2018-06-26 DIAGNOSIS — N3001 Acute cystitis with hematuria: Secondary | ICD-10-CM | POA: Diagnosis not present

## 2018-06-26 DIAGNOSIS — E860 Dehydration: Secondary | ICD-10-CM | POA: Diagnosis present

## 2018-06-26 DIAGNOSIS — Z66 Do not resuscitate: Secondary | ICD-10-CM | POA: Diagnosis not present

## 2018-06-26 DIAGNOSIS — N189 Chronic kidney disease, unspecified: Secondary | ICD-10-CM | POA: Diagnosis not present

## 2018-06-26 DIAGNOSIS — Z888 Allergy status to other drugs, medicaments and biological substances status: Secondary | ICD-10-CM | POA: Diagnosis not present

## 2018-06-26 DIAGNOSIS — N390 Urinary tract infection, site not specified: Secondary | ICD-10-CM | POA: Diagnosis not present

## 2018-06-26 DIAGNOSIS — E039 Hypothyroidism, unspecified: Secondary | ICD-10-CM | POA: Diagnosis present

## 2018-06-26 DIAGNOSIS — N183 Chronic kidney disease, stage 3 (moderate): Secondary | ICD-10-CM | POA: Diagnosis present

## 2018-06-26 DIAGNOSIS — M542 Cervicalgia: Secondary | ICD-10-CM | POA: Diagnosis present

## 2018-06-26 DIAGNOSIS — Z880 Allergy status to penicillin: Secondary | ICD-10-CM | POA: Diagnosis not present

## 2018-06-26 DIAGNOSIS — I481 Persistent atrial fibrillation: Secondary | ICD-10-CM | POA: Diagnosis not present

## 2018-06-26 DIAGNOSIS — Z881 Allergy status to other antibiotic agents status: Secondary | ICD-10-CM

## 2018-06-26 DIAGNOSIS — N179 Acute kidney failure, unspecified: Principal | ICD-10-CM | POA: Diagnosis present

## 2018-06-26 DIAGNOSIS — I129 Hypertensive chronic kidney disease with stage 1 through stage 4 chronic kidney disease, or unspecified chronic kidney disease: Secondary | ICD-10-CM | POA: Diagnosis not present

## 2018-06-26 DIAGNOSIS — E785 Hyperlipidemia, unspecified: Secondary | ICD-10-CM | POA: Diagnosis present

## 2018-06-26 DIAGNOSIS — I10 Essential (primary) hypertension: Secondary | ICD-10-CM | POA: Diagnosis not present

## 2018-06-26 LAB — CBC WITH DIFFERENTIAL/PLATELET
BASOS ABS: 0 10*3/uL (ref 0–0.1)
Basophils Relative: 0 %
Eosinophils Absolute: 0 10*3/uL (ref 0–0.7)
Eosinophils Relative: 0 %
HCT: 34.4 % — ABNORMAL LOW (ref 35.0–47.0)
Hemoglobin: 12.3 g/dL (ref 12.0–16.0)
LYMPHS PCT: 2 %
Lymphs Abs: 0.3 10*3/uL — ABNORMAL LOW (ref 1.0–3.6)
MCH: 32.6 pg (ref 26.0–34.0)
MCHC: 35.7 g/dL (ref 32.0–36.0)
MCV: 91.5 fL (ref 80.0–100.0)
Monocytes Absolute: 0.4 10*3/uL (ref 0.2–0.9)
Monocytes Relative: 3 %
NEUTROS ABS: 12.3 10*3/uL — AB (ref 1.4–6.5)
Neutrophils Relative %: 95 %
Platelets: 178 10*3/uL (ref 150–440)
RBC: 3.75 MIL/uL — AB (ref 3.80–5.20)
RDW: 17.2 % — ABNORMAL HIGH (ref 11.5–14.5)
WBC: 13.1 10*3/uL — ABNORMAL HIGH (ref 3.6–11.0)

## 2018-06-26 LAB — URINALYSIS, COMPLETE (UACMP) WITH MICROSCOPIC
BACTERIA UA: NONE SEEN
Bilirubin Urine: NEGATIVE
Glucose, UA: NEGATIVE mg/dL
Ketones, ur: NEGATIVE mg/dL
Nitrite: POSITIVE — AB
PH: 5 (ref 5.0–8.0)
Protein, ur: 100 mg/dL — AB
Specific Gravity, Urine: 1.016 (ref 1.005–1.030)

## 2018-06-26 LAB — BASIC METABOLIC PANEL
ANION GAP: 11 (ref 5–15)
BUN: 16 mg/dL (ref 8–23)
CHLORIDE: 98 mmol/L (ref 98–111)
CO2: 26 mmol/L (ref 22–32)
CREATININE: 1.57 mg/dL — AB (ref 0.44–1.00)
Calcium: 8.9 mg/dL (ref 8.9–10.3)
GFR calc Af Amer: 36 mL/min — ABNORMAL LOW (ref >60)
GFR calc non Af Amer: 31 mL/min — ABNORMAL LOW (ref >60)
GLUCOSE: 167 mg/dL — AB (ref 70–99)
POTASSIUM: 2.6 mmol/L — AB (ref 3.5–5.1)
Sodium: 135 mmol/L (ref 135–145)

## 2018-06-26 LAB — MAGNESIUM: MAGNESIUM: 1.7 mg/dL (ref 1.7–2.4)

## 2018-06-26 LAB — POTASSIUM: Potassium: 4.3 mmol/L (ref 3.5–5.1)

## 2018-06-26 MED ORDER — TRAMADOL HCL 50 MG PO TABS
50.0000 mg | ORAL_TABLET | Freq: Three times a day (TID) | ORAL | Status: DC | PRN
  Administered 2018-06-26 – 2018-06-27 (×4): 50 mg via ORAL
  Filled 2018-06-26 (×4): qty 1

## 2018-06-26 MED ORDER — AMIODARONE HCL 200 MG PO TABS
200.0000 mg | ORAL_TABLET | Freq: Two times a day (BID) | ORAL | Status: DC
  Administered 2018-06-26 – 2018-06-28 (×4): 200 mg via ORAL
  Filled 2018-06-26 (×5): qty 1

## 2018-06-26 MED ORDER — ONDANSETRON HCL 4 MG PO TABS: 4.0000 mg | ORAL_TABLET | Freq: Four times a day (QID) | ORAL | Status: DC | PRN

## 2018-06-26 MED ORDER — METOPROLOL SUCCINATE ER 50 MG PO TB24
50.0000 mg | ORAL_TABLET | Freq: Two times a day (BID) | ORAL | Status: DC
  Administered 2018-06-26 – 2018-06-28 (×4): 50 mg via ORAL
  Filled 2018-06-26 (×5): qty 1

## 2018-06-26 MED ORDER — ADULT MULTIVITAMIN W/MINERALS CH
1.0000 | ORAL_TABLET | Freq: Every day | ORAL | Status: DC
  Administered 2018-06-26 – 2018-06-28 (×3): 1 via ORAL
  Filled 2018-06-26 (×3): qty 1

## 2018-06-26 MED ORDER — VITAMIN D3 25 MCG (1000 UNIT) PO TABS
2000.0000 [IU] | ORAL_TABLET | Freq: Every day | ORAL | Status: DC
  Administered 2018-06-26 – 2018-06-28 (×3): 2000 [IU] via ORAL
  Filled 2018-06-26 (×3): qty 2

## 2018-06-26 MED ORDER — ONDANSETRON HCL 4 MG/2ML IJ SOLN
4.0000 mg | Freq: Four times a day (QID) | INTRAMUSCULAR | Status: DC | PRN
  Administered 2018-06-26: 4 mg via INTRAVENOUS
  Filled 2018-06-26: qty 2

## 2018-06-26 MED ORDER — SODIUM CHLORIDE 0.9 % IV SOLN
Freq: Once | INTRAVENOUS | Status: AC
  Administered 2018-06-26: 09:00:00 via INTRAVENOUS

## 2018-06-26 MED ORDER — SODIUM CHLORIDE 0.9 % IV SOLN
1.0000 g | INTRAVENOUS | Status: DC
  Administered 2018-06-27 – 2018-06-28 (×2): 1 g via INTRAVENOUS
  Filled 2018-06-26: qty 1
  Filled 2018-06-26: qty 10

## 2018-06-26 MED ORDER — ATORVASTATIN CALCIUM 20 MG PO TABS
20.0000 mg | ORAL_TABLET | Freq: Every day | ORAL | Status: DC
  Administered 2018-06-26 – 2018-06-27 (×2): 20 mg via ORAL
  Filled 2018-06-26 (×2): qty 1

## 2018-06-26 MED ORDER — POTASSIUM CHLORIDE CRYS ER 20 MEQ PO TBCR
40.0000 meq | EXTENDED_RELEASE_TABLET | Freq: Once | ORAL | Status: AC
  Administered 2018-06-26: 40 meq via ORAL
  Filled 2018-06-26: qty 2

## 2018-06-26 MED ORDER — ACETAMINOPHEN 325 MG PO TABS
650.0000 mg | ORAL_TABLET | Freq: Four times a day (QID) | ORAL | Status: DC | PRN
  Administered 2018-06-26 (×2): 650 mg via ORAL
  Filled 2018-06-26 (×2): qty 2

## 2018-06-26 MED ORDER — SENNOSIDES-DOCUSATE SODIUM 8.6-50 MG PO TABS: 1.0000 | ORAL_TABLET | Freq: Every evening | ORAL | Status: DC | PRN

## 2018-06-26 MED ORDER — POTASSIUM CHLORIDE IN NACL 40-0.9 MEQ/L-% IV SOLN
INTRAVENOUS | Status: DC
  Administered 2018-06-26: 75 mL/h via INTRAVENOUS
  Filled 2018-06-26 (×3): qty 1000

## 2018-06-26 MED ORDER — POTASSIUM CHLORIDE CRYS ER 20 MEQ PO TBCR
60.0000 meq | EXTENDED_RELEASE_TABLET | Freq: Once | ORAL | Status: AC
  Administered 2018-06-26: 60 meq via ORAL
  Filled 2018-06-26: qty 3

## 2018-06-26 MED ORDER — SODIUM CHLORIDE 0.9 % IV BOLUS: 500.0000 mL | Freq: Once | INTRAVENOUS | Status: DC

## 2018-06-26 MED ORDER — EZETIMIBE 10 MG PO TABS
10.0000 mg | ORAL_TABLET | Freq: Every day | ORAL | Status: DC
  Administered 2018-06-26 – 2018-06-28 (×3): 10 mg via ORAL
  Filled 2018-06-26 (×3): qty 1

## 2018-06-26 MED ORDER — DOXAZOSIN MESYLATE 1 MG PO TABS
1.0000 mg | ORAL_TABLET | Freq: Every day | ORAL | Status: DC
  Administered 2018-06-27 – 2018-06-28 (×2): 1 mg via ORAL
  Filled 2018-06-26 (×3): qty 1

## 2018-06-26 MED ORDER — ACETAMINOPHEN 650 MG RE SUPP: 650.0000 mg | Freq: Four times a day (QID) | RECTAL | Status: DC | PRN

## 2018-06-26 MED ORDER — SODIUM CHLORIDE 0.9 % IV SOLN
1.0000 g | Freq: Once | INTRAVENOUS | Status: AC
  Administered 2018-06-26: 1 g via INTRAVENOUS
  Filled 2018-06-26: qty 10

## 2018-06-26 MED ORDER — MAGNESIUM SULFATE 2 GM/50ML IV SOLN
2.0000 g | Freq: Once | INTRAVENOUS | Status: AC
  Administered 2018-06-26: 2 g via INTRAVENOUS
  Filled 2018-06-26: qty 50

## 2018-06-26 MED ORDER — LEVOTHYROXINE SODIUM 137 MCG PO TABS
137.0000 ug | ORAL_TABLET | Freq: Every day | ORAL | Status: DC
  Administered 2018-06-27 – 2018-06-28 (×2): 137 ug via ORAL
  Filled 2018-06-26 (×3): qty 1

## 2018-06-26 MED ORDER — TEMAZEPAM 15 MG PO CAPS
15.0000 mg | ORAL_CAPSULE | Freq: Every evening | ORAL | Status: DC | PRN
  Administered 2018-06-27: 15 mg via ORAL
  Filled 2018-06-26: qty 1

## 2018-06-26 MED ORDER — POTASSIUM CHLORIDE 10 MEQ/100ML IV SOLN
10.0000 meq | Freq: Once | INTRAVENOUS | Status: AC
  Administered 2018-06-26: 10 meq via INTRAVENOUS
  Filled 2018-06-26 (×2): qty 100

## 2018-06-26 MED ORDER — APIXABAN 5 MG PO TABS
5.0000 mg | ORAL_TABLET | Freq: Two times a day (BID) | ORAL | Status: DC
  Administered 2018-06-26 – 2018-06-28 (×5): 5 mg via ORAL
  Filled 2018-06-26 (×5): qty 1

## 2018-06-26 MED ORDER — BUSPIRONE HCL 5 MG PO TABS
5.0000 mg | ORAL_TABLET | Freq: Two times a day (BID) | ORAL | Status: DC
  Administered 2018-06-26 – 2018-06-28 (×4): 5 mg via ORAL
  Filled 2018-06-26 (×6): qty 1

## 2018-06-26 NOTE — Progress Notes (Signed)
Pharmacy Electrolyte Monitoring Consult:  Pharmacy consulted to assist in monitoring and replacing electrolytes in this 77 y.o. female admitted on 06/26/2018 with Urinary Tract Infection   Labs:  Sodium (mmol/L)  Date Value  06/26/2018 135  06/06/2016 141  02/24/2012 143   Potassium (mmol/L)  Date Value  06/26/2018 2.6 (LL)  02/24/2012 3.4 (L)   Magnesium (mg/dL)  Date Value  06/26/2018 1.7  02/24/2012 1.8   Calcium (mg/dL)  Date Value  06/26/2018 8.9   Calcium, Total (mg/dL)  Date Value  02/24/2012 8.8   Albumin (g/dL)  Date Value  04/22/2018 4.4  06/06/2016 4.7  02/23/2012 4.0   SCr 1.57  CrCl 29  Assessment/Plan: 0904 am: K 2.6 - Pt has received KCl 40 mEq PO x1 thus far NS with 40 mEq KCl at 75 ml/hr and KCl 10 mEq IV x1. After discussion with MD, MD requested  KCl 60 mEq PO x1                 Mag 1.7 - mag 2 g IV x1 since K is low 0904 pm(1747): K=4.3 mmol/L. No further replacement required at this time  Pharmacy will continue to follow.   Dallie Piles, PharmD 06/26/2018 5:37 PM

## 2018-06-26 NOTE — ED Provider Notes (Signed)
[facility] Emergency Department Provider Note  ____________________________________________  Time seen: Approximately 7:30 AM  I have reviewed the triage vital signs and the nursing notes.   HISTORY  Chief Complaint Urinary Tract Infection   HPI [name] is a 77 y.o. female with a history of CHF, DJD of the neck, hypertension, hyperlipidemia, hypothyroidism, A. fib on Eliquis who presents for evaluation of dysuria.  Patient reports 6 days of dysuria and frequency.  Over the last 3 days she started having bilateral flank pain right worse than left that she describes as a dull achy pain, constant and nonradiating.  She has had chills and a temp of 100F at home.  She took 650 mg of Tylenol this morning.  She reports nausea and one small nonbloody nonbilious emesis episode yesterday.  She is also complaining of mild dull/pressure suprapubic pain which has been present for 6 days.  She tried Azo at home with no relief.  She denies history of kidney stones.  Past Medical History:  Diagnosis Date  . Bronchitis   . Chronic combined systolic (congestive) and diastolic (congestive) heart failure ([place])    a. 02/2018 Echo: EF 45-50%, diff HK, triv AI, mod MR, mildly dil LA/RA, nl RV fxn, mod TR. PASP 40-40mmHg.  Marland Kitchen DJD (degenerative joint disease), cervical   . History of stress test    a. 01/2008 MV: EF 76%. Fair ex tol. No ischemia.  . Hyperlipidemia   . Hypertension   . Hypothyroid   . LBBB (left bundle branch block)   . Leg pain    a. 01/2018 ABI's wnl.  . Persistent atrial fibrillation ([place])    a. 01/2018 Event monitor: 45 runs of SVT, longes 14.5 sec. SVT felt to be Afib/flutter-->2% burden. Longest run of AF 4h 52m (CHA2DS2VASc = 4-->Xarelto).  . 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 Chronic heart failure ([place]) 05/24/2018  . Atrial fibrillation ([place])  05/24/2018  . CKD (chronic kidney disease), stage III ([place]) 05/17/2018  . 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 Shortness of breath 05/07/2018  . Acute systolic CHF (congestive heart failure), NYHA class 2 ([place])   . Pulmonary hypertension, unspecified ([place]) 04/05/2018  . Chronic combined systolic (congestive) and diastolic (congestive) heart failure ([place]) 04/05/2018  . Persistent atrial fibrillation ([place]) 03/25/2018  . Paroxysmal sinus tachycardia ([place]) 01/29/2018  . Leg pain 01/04/2018  . PAD (peripheral artery disease) ([place]) 01/04/2018  . Carotid artery calcification, bilateral 12/19/2017  . Cervical radiculitis 12/19/2017  . Atrial fibrillation, new onset ([place]) 12/19/2017  . Hyperglycemia 03/06/2016  . Hypothyroid 04/21/2015  . 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 Anxiety 04/21/2015  . Hyperlipidemia 04/21/2015  . Diuretic-induced hypokalemia 04/21/2015  . Palpitations 12/18/2012  . Hypertension 05/27/2012    Past Surgical History:  Procedure Laterality Date  . CARDIOVERSION N/A 03/26/2018   Procedure: CARDIOVERSION;  Surgeon: Nelva Bush, MD;  Location: [facility] ORS;  Service: Cardiovascular;  Laterality: N/A;  . CARDIOVERSION N/A 04/24/2018   Procedure: CARDIOVERSION;  Surgeon: Minna Merritts, MD;  Location: [facility] ORS;  Service: Cardiovascular;  Laterality: N/A;  . ELECTROPHYSIOLOGY STUDY N/A 02/27/2012   Procedure: ELECTROPHYSIOLOGY STUDY;  Surgeon: Evans Lance, MD;  Location: [facility] CATH LAB;  Service: Cardiovascular;  Laterality: N/A;  . EPS and ablation for SVT  5/13   slow pathway ablation by  Dr Lovena Le  . EYE SURGERY     surgery for detatched retina  . SUPRAVENTRICULAR TACHYCARDIA ABLATION N/A 02/27/2012   Procedure: SUPRAVENTRICULAR TACHYCARDIA ABLATION;  Surgeon: Evans Lance, MD;  Location[facility] CATH LAB;  Service: Cardiovascular;  Laterality: N/A;    Prior to Admission medications   Medication Sig Start Date End Date Taking? Authorizing  Provider  amiodarone (PACERONE) 200 MG tablet Take 1 tablet (200 mg total) by mouth daily. Patient taking differently: Take 200 mg by mouth 2 (two) times daily.  05/07/18  Yes Gollan, Kathlene November, MD  apixaban (ELIQUIS) 5 MG TABS tablet Take 1 tablet (5 mg total) by mouth 2 (two) times daily. 05/17/18  Yes Sowles, Drue Stager, MD  atorvastatin (LIPITOR) 20 MG tablet TAKE 1 TABLET(20 MG) BY MOUTH DAILY AT 6 PM 04/06/18  Yes Sowles, Drue Stager, MD  busPIRone (BUSPAR) 5 MG tablet Take 1 tablet (5 mg total) by mouth 2 (two) times daily. 02/15/18  Yes Sowles, Drue Stager, MD  cholecalciferol (VITAMIN D) 1000 UNITS tablet Take 2,000 Units by mouth daily.   Yes [provider]  doxazosin (CARDURA) 1 MG tablet Take 1 tablet (1 mg total) by mouth daily. 05/07/18  Yes Minna Merritts, MD  ezetimibe (ZETIA) 10 MG tablet Take 1 tablet (10 mg total) by mouth daily. 01/29/18  Yes Gollan, Kathlene November, MD  furosemide (LASIX) 20 MG tablet Take 1 tablet (20 mg total) by mouth daily. 05/17/18  Yes Sowles, Drue Stager, MD  levothyroxine (SYNTHROID, LEVOTHROID) 137 MCG tablet TAKE 1 TABLET(137 MCG) BY MOUTH EVERY MORNING 04/06/18  Yes Sowles, Drue Stager, MD  metoprolol succinate (TOPROL-XL) 50 MG 24 hr tablet Take 1 tablet (50 mg total) by mouth 2 (two) times daily. 04/04/18  Yes Gollan, Kathlene November, MD  Multiple Vitamin (MULITIVITAMIN WITH MINERALS) TABS Take 1 tablet by mouth daily.   Yes [provider]  potassium chloride (K-DUR) 10 MEQ tablet Take 1 tablet (10 mEq total) by mouth daily. 05/17/18 07/16/18 Yes Sowles, Drue Stager, MD  temazepam (RESTORIL) 15 MG capsule Take 1 capsule (15 mg total) by mouth at bedtime as needed for sleep. 05/17/18  Yes Sowles, Drue Stager, MD  valsartan (DIOVAN) 320 MG tablet Take 1 tablet (320 mg total) by mouth daily. 05/17/18 07/16/18 Yes Sowles, Drue Stager, MD  acetaminophen (TYLENOL) 650 MG CR tablet Take 1,300 mg by mouth every 8 (eight) hours as needed for pain.    [provider]  guaiFENesin  (MUCINEX) 600 MG 12 hr tablet Take 1 tablet (600 mg total) by mouth 2 (two) times daily. Patient not taking: Reported on 06/26/2018 04/05/18   Steele Sizer, MD    Allergies Codeine; Erythromycin; Septra [sulfamethoxazole-trimethoprim]; and Penicillins  Family History  Problem Relation Age of Onset  . Alzheimer's disease Mother   . Stroke Father   . Breast cancer Father   . Heart disease Father        Pacemaker  . Breast cancer Paternal Aunt   . Kidney cancer Maternal Grandmother   . Alzheimer's disease Maternal Grandfather   . Thyroid disease Paternal Grandmother   . Stroke Paternal Grandfather     Social History Social History   Tobacco Use  . Smoking status: Never Smoker  . Smokeless tobacco: Never Used  . Tobacco comment: smoking cessation materials not required  Substance Use Topics  . Alcohol use: No  . Drug use: No    Review of Systems  Constitutional: + low grade fever and chills Eyes: Negative for visual changes. ENT: Negative for  sore throat. Neck: No neck pain  Cardiovascular: Negative for chest pain. Respiratory: Negative for shortness of breath. Gastrointestinal: Negative for abdominal pain or diarrhea. + nausea and vomiting Genitourinary: + dysuria, b/l flank pain Musculoskeletal: Negative for back pain. Skin: Negative for rash. Neurological: Negative for headaches, weakness or numbness. Psych: No SI or HI  ____________________________________________   PHYSICAL EXAM:  VITAL SIGNS: ED Triage Vitals  Enc Vitals Group     BP 06/26/18 0710 (!) 117/37     Pulse Rate 06/26/18 0710 72     Resp 06/26/18 0710 16     Temp 06/26/18 0710 98 F (36.7 C)     Temp Source 06/26/18 0710 Oral     SpO2 06/26/18 0710 94 %     Weight 06/26/18 0712 144 lb (65.3 kg)     Height 06/26/18 0712 5\' 6"  (1.676 m)     Head Circumference --      Peak Flow --      Pain Score 06/26/18 0712 7     Pain Loc --      Pain Edu? --      Excl. in [place]? --      Constitutional: Alert and oriented. Well appearing and in no apparent distress. HEENT:      Head: Normocephalic and atraumatic.         Eyes: Conjunctivae are normal. Sclera is non-icteric.       Mouth/Throat: Mucous membranes are moist.       Neck: Supple with no signs of meningismus. Cardiovascular: Regular rate and rhythm. No murmurs, gallops, or rubs. 2+ symmetrical distal pulses are present in all extremities. No JVD. Respiratory: Normal respiratory effort. Lungs are clear to auscultation bilaterally. No wheezes, crackles, or rhonchi.  Gastrointestinal: Soft, mild suprapubic tenderness to palpation, and non distended with positive bowel sounds. No rebound or guarding. Genitourinary: No CVA tenderness. Musculoskeletal: Nontender with normal range of motion in all extremities. No edema, cyanosis, or erythema of extremities. Neurologic: Normal speech and language. Face is symmetric. Moving all extremities. No gross focal neurologic deficits are appreciated. Skin: Skin is warm, dry and intact. No rash noted. Psychiatric: Mood and affect are normal. Speech and behavior are normal.  ____________________________________________   LABS (all labs ordered are listed, but only abnormal results are displayed)  Labs Reviewed  CBC WITH DIFFERENTIAL/PLATELET - Abnormal; Notable for the following components:      Result Value   WBC 13.1 (*)    RBC 3.75 (*)    HCT 34.4 (*)    RDW 17.2 (*)    Neutro Abs 12.3 (*)    Lymphs Abs 0.3 (*)    All other components within normal limits  BASIC METABOLIC PANEL - Abnormal; Notable for the following components:   Potassium 2.6 (*)    Glucose, Bld 167 (*)    Creatinine, Ser 1.57 (*)    GFR calc non Af Amer 31 (*)    GFR calc Af Amer 36 (*)    All other components within normal limits  URINALYSIS, COMPLETE (UACMP) WITH MICROSCOPIC - Abnormal; Notable for the following components:   Color, Urine AMBER (*)    APPearance CLOUDY (*)    Hgb urine  dipstick MODERATE (*)    Protein, ur 100 (*)    Nitrite POSITIVE (*)    Leukocytes, UA MODERATE (*)    RBC / HPF >50 (*)    WBC, UA >50 (*)    Non Squamous Epithelial PRESENT (*)    All  other components within normal limits  URINE CULTURE   ____________________________________________  EKG  ED ECG REPORT I, Rudene Re, the attending physician, personally viewed and interpreted this ECG.  Normal sinus rhythm with a rate of 69, prolonged QTC, left bundle branch block, left axis deviation, no ST elevations or depressions.  Unchanged from prior. ____________________________________________  RADIOLOGY  none  ____________________________________________   PROCEDURES  Procedure(s) performed: None Procedures Critical Care performed:  None ____________________________________________   INITIAL IMPRESSION / ASSESSMENT AND PLAN / ED COURSE  77 y.o. female with a history of CHF, DJD of the neck, hypertension, hyperlipidemia, hypothyroidism, A. fib on Eliquis who presents for evaluation of dysuria x 5 days and 3 days of bilateral flank pain, chills, nausea.  Patient is well-appearing, in no distress, she has normal vital signs, she is afebrile with no tachycardia and normotensive.  Presentation concerning for UTI versus early pyelonephritis.  Less likely kidney stone with bilateral flank pain.  Will check CBC, BMP, urinalysis.  Clinical Course as of Jun 26 842  Wed Jun 26, 2018  0839 UA positive for urinary tract infection with an elevated white count of 13.1.  Vitals remain normal with no signs of sepsis at this time.  Patient was given Rocephin.  Creatinine showing acute on chronic kidney injury.  Patient given gentle hydration of 75cc/hr cc due to history of CHF.  Also hypokalemic with a K of 2.6 for which she was supplemented p.o. and IV.  Due to all of the above findings I feel that it is safer if patient be admitted to the hospital for close monitoring.   [CV]    Clinical  Course User Index [CV] Alfred Levins [place], MD     As part of my medical decision making, I reviewed the following data within the [facility] notes reviewed and incorporated, Labs reviewed , EKG interpreted , Old EKG reviewed, Old chart reviewed, Discussed with admitting physician  Notes from prior ED visits and [place] Controlled Substance Database    Pertinent labs & imaging results that were available during my care of the patient were reviewed by me and considered in my medical decision making (see chart for details).    ____________________________________________   FINAL CLINICAL IMPRESSION(S) / ED DIAGNOSES  Final diagnoses:  Acute cystitis with hematuria  Hypokalemia  Acute kidney injury superimposed on chronic kidney disease ([place])      NEW MEDICATIONS STARTED DURING THIS VISIT:  ED Discharge Orders    None       Note:  This document was prepared using Dragon voice recognition software and may include unintentional dictation errors.    Alfred Levins, [place], MD 06/26/18 [phone]

## 2018-06-26 NOTE — Progress Notes (Signed)
Family Meeting Note  Advance Directive:yes  Today a meeting took place with the pt in the room. No family in the room  Patient is being admitted with UTI, acute on chronic renal failure has history of chronic a fib on eliquis. She has chronic neck pain. Discuss code status with patient. She wants it to be DNR.  Time spent during discussion: 16 mins Fritzi Mandes, MD

## 2018-06-26 NOTE — Progress Notes (Signed)
Pharmacy Electrolyte Monitoring Consult:  Pharmacy consulted to assist in monitoring and replacing electrolytes in this 77 y.o. female admitted on 06/26/2018 with Urinary Tract Infection   Labs:  Sodium (mmol/L)  Date Value  06/26/2018 135  06/06/2016 141  02/24/2012 143   Potassium (mmol/L)  Date Value  06/26/2018 2.6 (LL)  02/24/2012 3.4 (L)   Magnesium (mg/dL)  Date Value  04/22/2018 2.2  02/24/2012 1.8   Calcium (mg/dL)  Date Value  06/26/2018 8.9   Calcium, Total (mg/dL)  Date Value  02/24/2012 8.8   Albumin (g/dL)  Date Value  04/22/2018 4.4  06/06/2016 4.7  02/23/2012 4.0   SCr 1.57  CrCl 29  Assessment/Plan: K 2.6 - Pt has received KCl 40 mEq PO x1 thus far; pt ordered NS with 40 mEq KCl at 75 ml/hr and KCl 10 mEq IV x1. Per discussion with MD, MD requesting PO KCl replacement at this time.   Will order KCl 60 mEq PO x1 and recheck at 1800  Mag 1.7 - will order mag 2 g IV x1 since K is low  Pharmacy will continue to follow.   Rocky Morel 06/26/2018 12:13 PM

## 2018-06-26 NOTE — ED Notes (Signed)
Pt missed urine collection device in toilet. Still no urine to send for urine culture.

## 2018-06-26 NOTE — ED Triage Notes (Signed)
Pt here from home with c/o burning with urination that began last week, states she took azo and has been feeling "bad" for a few days now, also c/o aching in her legs, lower back and neck. Appears in NAD.

## 2018-06-26 NOTE — H&P (Signed)
[facility] at [place] NAME: [name]    MR#:  [id number]  DATE OF BIRTH:  [birth year]  DATE OF ADMISSION:  06/26/2018  PRIMARY CARE PHYSICIAN: Steele Sizer, MD   REQUESTING/REFERRING PHYSICIAN: Dr Alfred Levins  CHIEF COMPLAINT:   Dysuria back pain and chills HISTORY OF PRESENT ILLNESS:  [name]  is a 77 y.o. female with a known history of DJD of the neck, hypertension, hyperlipidemia, hypothyroidism, atrial fibrillation chronic on eliquis comes to the emergency room for evaluation of dysuria. She reports six days on and off of dysuria frequency last three days started having bilateral flank pain right worse than left. Denies any nausea or vomiting today however did have some nausea yesterday.  She was found to have urinary tract infection along with hypokalemia with potassium of 2.8 leg cramping and temperature 100F at home. With acute on chronic renal failure, acute cystitis and severe hyperkalemia.  PAST MEDICAL HISTORY:   Past Medical History:  Diagnosis Date  . Bronchitis   . Chronic combined systolic (congestive) and diastolic (congestive) heart failure ([place])    a. 02/2018 Echo: EF 45-50%, diff HK, triv AI, mod MR, mildly dil LA/RA, nl RV fxn, mod TR. PASP 40-55mmHg.  Marland Kitchen DJD (degenerative joint disease), cervical   . History of stress test    a. 01/2008 MV: EF 76%. Fair ex tol. No ischemia.  . Hyperlipidemia   . Hypertension   . Hypothyroid   . LBBB (left bundle branch block)   . Leg pain    a. 01/2018 ABI's wnl.  . Persistent atrial fibrillation ([place])    a. 01/2018 Event monitor: 45 runs of SVT, longes 14.5 sec. SVT felt to be Afib/flutter-->2% burden. Longest run of AF 4h 50m (CHA2DS2VASc = 4-->Xarelto).  . 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 CARDIOVERSION N/A 03/26/2018   Procedure:  CARDIOVERSION;  Surgeon: Nelva Bush, MD;  Location: [facility] ORS;  Service: Cardiovascular;  Laterality: N/A;  . CARDIOVERSION N/A 04/24/2018   Procedure: CARDIOVERSION;  Surgeon: Minna Merritts, MD;  Location: [facility] ORS;  Service: Cardiovascular;  Laterality: N/A;  . ELECTROPHYSIOLOGY STUDY N/A 02/27/2012   Procedure: ELECTROPHYSIOLOGY STUDY;  Surgeon: Evans Lance, MD;  Location: [facility] C[facility] LAB;  Service: Cardiovascular;  Laterality: N/A;  . EPS and ablation for SVT  5/13   slow pathway ablation by Dr Lovena Le  . EYE SURGERY     surgery for detatched retina  . SUPRAVENTRICULAR TACHYCARDIA ABLATION N/A 02/27/2012   Procedure: SUPRAVENTRICULAR TACHYCARDIA ABLATION;  Surgeon: Evans Lance, MD;  Location: [facility] CATH LAB;  Service: Cardiovascular;  Laterality: N/A;    SOCIAL HISTORY:   Social History   Tobacco Use  . Smoking status: Never Smoker  . Smokeless tobacco: Never Used  . Tobacco comment: smoking cessation materials not required  Substance Use Topics  . Alcohol use: No    FAMILY HISTORY:   Family History  Problem Relation Age of Onset  . Alzheimer's disease Mother   . Stroke Father   . Breast cancer Father   . Heart disease Father        Pacemaker  . Breast cancer Paternal Aunt   . Kidney cancer Maternal Grandmother   . Alzheimer's disease Maternal Grandfather   . Thyroid disease Paternal Grandmother   . Stroke Paternal Grandfather  DRUG ALLERGIES:   Allergies  Allergen Reactions  . Codeine Nausea And Vomiting  . Erythromycin Itching and Swelling  . Septra [Sulfamethoxazole-Trimethoprim] Swelling  . Penicillins Swelling and Rash    Has patient had a PCN reaction causing immediate rash, facial/tongue/throat swelling, SOB or lightheadedness with hypotension: Yes Has patient had a PCN reaction causing severe rash involving mucus membranes or skin necrosis: No Has patient had a PCN reaction that required hospitalization: No Has patient had a PCN reaction occurring  within the last 10 years: No If all of the above answers are "NO", then may proceed with Cephalosporin use.     REVIEW OF SYSTEMS:  Review of Systems  Constitutional: Positive for chills. Negative for weight loss.  HENT: Negative for ear discharge, ear pain and nosebleeds.   Eyes: Negative for blurred vision, pain and discharge.  Respiratory: Negative for sputum production, shortness of breath, wheezing and stridor.   Cardiovascular: Negative for chest pain, palpitations, orthopnea and PND.  Gastrointestinal: Negative for abdominal pain, diarrhea, nausea and vomiting.  Genitourinary: Positive for dysuria and frequency. Negative for urgency.  Musculoskeletal: Positive for back pain. Negative for joint pain.  Neurological: Negative for sensory change, speech change, focal weakness and weakness.  Psychiatric/Behavioral: Negative for depression and hallucinations. The patient is not nervous/anxious.      MEDICATIONS AT HOME:   Prior to Admission medications   Medication Sig Start Date End Date Taking? Authorizing Provider  amiodarone (PACERONE) 200 MG tablet Take 1 tablet (200 mg total) by mouth daily. Patient taking differently: Take 200 mg by mouth 2 (two) times daily.  05/07/18  Yes Gollan, Kathlene November, MD  apixaban (ELIQUIS) 5 MG TABS tablet Take 1 tablet (5 mg total) by mouth 2 (two) times daily. 05/17/18  Yes Sowles, Drue Stager, MD  atorvastatin (LIPITOR) 20 MG tablet TAKE 1 TABLET(20 MG) BY MOUTH DAILY AT 6 PM 04/06/18  Yes Sowles, Drue Stager, MD  busPIRone (BUSPAR) 5 MG tablet Take 1 tablet (5 mg total) by mouth 2 (two) times daily. 02/15/18  Yes Sowles, Drue Stager, MD  cholecalciferol (VITAMIN D) 1000 UNITS tablet Take 2,000 Units by mouth daily.   Yes [provider]  doxazosin (CARDURA) 1 MG tablet Take 1 tablet (1 mg total) by mouth daily. 05/07/18  Yes Minna Merritts, MD  ezetimibe (ZETIA) 10 MG tablet Take 1 tablet (10 mg total) by mouth daily. 01/29/18  Yes Gollan, Kathlene November, MD   furosemide (LASIX) 20 MG tablet Take 1 tablet (20 mg total) by mouth daily. 05/17/18  Yes Sowles, Drue Stager, MD  levothyroxine (SYNTHROID, LEVOTHROID) 137 MCG tablet TAKE 1 TABLET(137 MCG) BY MOUTH EVERY MORNING 04/06/18  Yes Sowles, Drue Stager, MD  metoprolol succinate (TOPROL-XL) 50 MG 24 hr tablet Take 1 tablet (50 mg total) by mouth 2 (two) times daily. 04/04/18  Yes Gollan, Kathlene November, MD  Multiple Vitamin (MULITIVITAMIN WITH MINERALS) TABS Take 1 tablet by mouth daily.   Yes [provider]  potassium chloride (K-DUR) 10 MEQ tablet Take 1 tablet (10 mEq total) by mouth daily. 05/17/18 07/16/18 Yes Sowles, Drue Stager, MD  temazepam (RESTORIL) 15 MG capsule Take 1 capsule (15 mg total) by mouth at bedtime as needed for sleep. 05/17/18  Yes Sowles, Drue Stager, MD  valsartan (DIOVAN) 320 MG tablet Take 1 tablet (320 mg total) by mouth daily. 05/17/18 07/16/18 Yes Sowles, Drue Stager, MD  acetaminophen (TYLENOL) 650 MG CR tablet Take 1,300 mg by mouth every 8 (eight) hours as needed for pain.    [provider]      VITAL SIGNS:  Blood pressure (!) 150/83, pulse 71, temperature (!) 97.4 F (36.3 C), temperature source Oral, resp. rate 18, height 5\' 6"  (1.676 m), weight 63.7 kg, SpO2 96 %.  PHYSICAL EXAMINATION:  GENERAL:  77 y.o.-year-old patient lying in the bed with no acute distress.  EYES: Pupils equal, round, reactive to light and accommodation. No scleral icterus. Extraocular muscles intact.  HEENT: Head atraumatic, normocephalic. Oropharynx and nasopharynx clear.  NECK:  Supple, no jugular venous distention. No thyroid enlargement, no tenderness.  LUNGS: Normal breath sounds bilaterally, no wheezing, rales,rhonchi or crepitation. No use of accessory muscles of respiration.  CARDIOVASCULAR: S1, S2 normal. No murmurs, rubs, or gallops.  ABDOMEN: Soft, nontender, nondistended. Bowel sounds present. No organomegaly or mass.  EXTREMITIES: No pedal edema, cyanosis, or clubbing.  NEUROLOGIC:  Cranial nerves II through XII are intact. Muscle strength 5/5 in all extremities. Sensation intact. Gait not checked.  PSYCHIATRIC: The patient is alert and oriented x 3.  SKIN: No obvious rash, lesion, or ulcer.   LABORATORY PANEL:   CBC Recent Labs  Lab 06/26/18 0733  WBC 13.1*  HGB 12.3  HCT 34.4*  PLT 178   ------------------------------------------------------------------------------------------------------------------  Chemistries  Recent Labs  Lab 06/26/18 0733  NA 135  K 2.6*  CL 98  CO2 26  GLUCOSE 167*  BUN 16  CREATININE 1.57*  CALCIUM 8.9  MG 1.7   ------------------------------------------------------------------------------------------------------------------  Cardiac Enzymes No results for input(s): TROPONINI in the last 168 hours. ------------------------------------------------------------------------------------------------------------------  RADIOLOGY:  No results found.  EKG:    IMPRESSION AND PLAN:    [name]  is a 77 y.o. female with a known history of DJD of the neck, hypertension, hyperlipidemia, hypothyroidism, atrial fibrillation chronic on eliquis comes to the emergency room for evaluation of dysuria. She reports six days on and off of dysuria frequency last three days started having bilateral flank pain right worse than left. Denies any nausea or vomiting today however did have some nausea yesterday.  1. acute on chronic renal failure due to dehydration in the setting of acute cystitis -IV fluids at low rate -IV Rocephin. Follow-up urine culture -monitor fever curve -avoid nephrotoxins. Hold off Lasix today  2. chronic atrial fibrillation on PO eliquis  3. Hypertension continue home meds  4. Hypokalemia  - replace oral and IV K  5. Chronic neck pain Prn pain meds  Ambulate in the hallways    All the records are reviewed and case discussed with ED provider. Management plans discussed with the patient, family and they  are in agreement.  CODE STATUS: DNR  TOTAL TIME TAKING CARE OF THIS PATIENT: *45* minutes.    Fritzi Mandes M.D on 06/26/2018 at 2:46 PM  Between 7am to 6pm - Pager - [phone]  After 6pm go to [email or web address] - password [email or web address] [facility]  [facility]  Office  [phone]  CC: Primary care physician; Steele Sizer, MD

## 2018-06-26 NOTE — Progress Notes (Signed)
Patient complaining of severe pain in neck and back, 9 out 10, pt states "its intolerable, just hurts" Dr. Posey Pronto notified and received verbal orders for Ultram 50 mg. Will administer and continue to monitor.

## 2018-06-26 NOTE — ED Notes (Signed)
Dorian, EDT, to transport pt to 2A-246. Floor aware.

## 2018-06-27 LAB — BASIC METABOLIC PANEL
Anion gap: 6 (ref 5–15)
BUN: 23 mg/dL (ref 8–23)
CALCIUM: 8.3 mg/dL — AB (ref 8.9–10.3)
CO2: 24 mmol/L (ref 22–32)
CREATININE: 1.39 mg/dL — AB (ref 0.44–1.00)
Chloride: 105 mmol/L (ref 98–111)
GFR calc non Af Amer: 36 mL/min — ABNORMAL LOW (ref >60)
GFR, EST AFRICAN AMERICAN: 42 mL/min — AB (ref >60)
Glucose, Bld: 132 mg/dL — ABNORMAL HIGH (ref 70–99)
Potassium: 4.9 mmol/L (ref 3.5–5.1)
Sodium: 135 mmol/L (ref 135–145)

## 2018-06-27 LAB — MAGNESIUM: Magnesium: 2.5 mg/dL — ABNORMAL HIGH (ref 1.7–2.4)

## 2018-06-27 NOTE — Progress Notes (Signed)
[place] at [place] NAME: [name]    MR#:  [id number]  DATE OF BIRTH:  1941/05/17  SUBJECTIVE: Patient admitted for acute on chronic renal failure, cystitis.  Patient had dysuria, back pain and chills.  Today she is feeling much better started on antibiotics for UTI.  Patient Lasix is held because of renal failure.  She has no shortness of breath, no back pain today.  CHIEF COMPLAINT:   Chief Complaint  Patient presents with  . Urinary Tract Infection    REVIEW OF SYSTEMS:   ROS CONSTITUTIONAL: No fever, fatigue or weakness.  EYES: No blurred or double vision.  EARS, NOSE, AND THROAT: No tinnitus or ear pain.  RESPIRATORY: No cough, shortness of breath, wheezing or hemoptysis.  CARDIOVASCULAR: No chest pain, orthopnea, edema.  GASTROINTESTINAL: No nausea, vomiting, diarrhea or abdominal pain.  GENITOURINARY: No dysuria, hematuria.  ENDOCRINE: No polyuria, nocturia,  HEMATOLOGY: No anemia, easy bruising or bleeding SKIN: No rash or lesion. MUSCULOSKELETAL: No joint pain or arthritis.   NEUROLOGIC: No tingling, numbness, weakness.  PSYCHIATRY: No anxiety or depression.   DRUG ALLERGIES:   Allergies  Allergen Reactions  . Codeine Nausea And Vomiting  . Erythromycin Itching and Swelling  . Septra [Sulfamethoxazole-Trimethoprim] Swelling  . Penicillins Swelling and Rash    Has patient had a PCN reaction causing immediate rash, facial/tongue/throat swelling, SOB or lightheadedness with hypotension: Yes Has patient had a PCN reaction causing severe rash involving mucus membranes or skin necrosis: No Has patient had a PCN reaction that required hospitalization: No Has patient had a PCN reaction occurring within the last 10 years: No If all of the above answers are "NO", then may proceed with Cephalosporin use.     VITALS:  Blood pressure (!) 118/44, pulse 77, temperature 98.4 F (36.9 C), temperature source Oral, resp.  rate 18, height 5\' 6"  (1.676 m), weight 65.7 kg, SpO2 94 %.  PHYSICAL EXAMINATION:  GENERAL:  77 y.o.-year-old patient lying in the bed with no acute distress.  EYES: Pupils equal, round, reactive to light and accommodation. No scleral icterus. Extraocular muscles intact.  HEENT: Head atraumatic, normocephalic. Oropharynx and nasopharynx clear.  NECK:  Supple, no jugular venous distention. No thyroid enlargement, no tenderness.  LUNGS: Normal breath sounds bilaterally, no wheezing, rales,rhonchi or crepitation. No use of accessory muscles of respiration.  CARDIOVASCULAR: S1, S2 normal. No murmurs, rubs, or gallops.  ABDOMEN: Soft, nontender, nondistended. Bowel sounds present. No organomegaly or mass.  EXTREMITIES: No pedal edema, cyanosis, or clubbing.  NEUROLOGIC: Cranial nerves II through XII are intact. Muscle strength 5/5 in all extremities. Sensation intact. Gait not checked.  PSYCHIATRIC: The patient is alert and oriented x 3.  SKIN: No obvious rash, lesion, or ulcer.    LABORATORY PANEL:   CBC Recent Labs  Lab 06/26/18 0733  WBC 13.1*  HGB 12.3  HCT 34.4*  PLT 178   ------------------------------------------------------------------------------------------------------------------  Chemistries  Recent Labs  Lab 06/27/18 0415  NA 135  K 4.9  CL 105  CO2 24  GLUCOSE 132*  BUN 23  CREATININE 1.39*  CALCIUM 8.3*  MG 2.5*   ------------------------------------------------------------------------------------------------------------------  Cardiac Enzymes No results for input(s): TROPONINI in the last 168 hours. ------------------------------------------------------------------------------------------------------------------  RADIOLOGY:  No results found.  EKG:   Orders placed or performed in visit on 05/07/18  . EKG 12-Lead    ASSESSMENT AND PLAN:  77 year old female with history of hypertension, hyperlipidemia, chronic atrial fibrillation on Eliquis came  in  because of dysuria, back pain. 1.  Acute on chronic renal failure secondary to dehydration in the setting of acute cystitis: Patient receiving IV fluids, kidney function improving, creatinine decreased from 1.57-1.39. 2.  Severe hypokalemia secondary to diuretics: Patient potassium 2.6 yesterday but 4.9 today with replacement. 3.  Acute cystitis,; urine cultures are pending, continue IV Rocephin. #4. chronicfibrillation: Rate controlled, continue Eliquis, Toprol-XL 50 mg daily, amiodarone 200 mg p.o. twice daily #5. acute on chronic systolic heart failure, ejection fraction 45 to 50% by echocardiogram in May.  Hold Lasix in light of renal failure.  Resume Lasix from tomorrow. All the records are reviewed and case discussed with Care Management/Social Workerr. Management plans discussed with the patient, family and they are in agreement.  CODE STATUS: DNR.  TOTAL TIME TAKING CARE OF THIS PATIENT: 38 min.   POSSIBLE D/C IN 1-2DAYS, DEPENDING ON CLINICAL CONDITION. 50% time spent in counseling, coordination of care, reviewing medical records, medications.  Epifanio Lesches M.D on 06/27/2018 at 12:14 PM  Between 7am to 6pm - Pager - [phone]  After 6pm go to [email or web address] - password [email or web address] [facility]  [facility]  Office  [phone]  CC: Primary care physician; Steele Sizer, MD   Note: This dictation was prepared with Dragon dictation along with smaller phrase technology. Any transcriptional errors that result from this process are unintentional.

## 2018-06-27 NOTE — Progress Notes (Signed)
Pharmacy Electrolyte Monitoring Consult:  Pharmacy consulted to assist in monitoring and replacing electrolytes in this 77 y.o. female admitted on 06/26/2018 with Urinary Tract Infection   Labs:  Sodium (mmol/L)  Date Value  06/27/2018 135  06/06/2016 141  02/24/2012 143   Potassium (mmol/L)  Date Value  06/27/2018 4.9  02/24/2012 3.4 (L)   Magnesium (mg/dL)  Date Value  06/27/2018 2.5 (H)  02/24/2012 1.8   Calcium (mg/dL)  Date Value  06/27/2018 8.3 (L)   Calcium, Total (mg/dL)  Date Value  02/24/2012 8.8   Albumin (g/dL)  Date Value  04/22/2018 4.4  06/06/2016 4.7  02/23/2012 4.0   SCr 1.57  CrCl 29  Assessment/Plan: K=4.9, Mag=2.5, no supplementation required at this time. BMET with AM labs.  Pharmacy will continue to follow.   Paulina Fusi, PharmD, BCPS 06/27/2018 11:31 AM

## 2018-06-28 ENCOUNTER — Telehealth: Payer: Self-pay

## 2018-06-28 LAB — BASIC METABOLIC PANEL
Anion gap: 6 (ref 5–15)
BUN: 20 mg/dL (ref 8–23)
CHLORIDE: 105 mmol/L (ref 98–111)
CO2: 24 mmol/L (ref 22–32)
CREATININE: 1.26 mg/dL — AB (ref 0.44–1.00)
Calcium: 8.4 mg/dL — ABNORMAL LOW (ref 8.9–10.3)
GFR calc non Af Amer: 41 mL/min — ABNORMAL LOW (ref >60)
GFR, EST AFRICAN AMERICAN: 47 mL/min — AB (ref >60)
GLUCOSE: 133 mg/dL — AB (ref 70–99)
Potassium: 4.9 mmol/L (ref 3.5–5.1)
Sodium: 135 mmol/L (ref 135–145)

## 2018-06-28 MED ORDER — CIPROFLOXACIN HCL 500 MG PO TABS: 500.0000 mg | ORAL_TABLET | Freq: Two times a day (BID) | ORAL | 0 refills | Status: AC

## 2018-06-28 MED ORDER — FUROSEMIDE 10 MG/ML IJ SOLN
40.0000 mg | Freq: Once | INTRAMUSCULAR | Status: AC
  Administered 2018-06-28: 40 mg via INTRAVENOUS
  Filled 2018-06-28: qty 4

## 2018-06-28 NOTE — Telephone Encounter (Signed)
Spoke with pt and she scheduled appt for 9.12.19 with Dr Ancil Boozer

## 2018-06-28 NOTE — Telephone Encounter (Signed)
Please change to next Thursday . Discussed with Karene Fry

## 2018-06-28 NOTE — Telephone Encounter (Signed)
Pt states that appt date and time will be fine!!

## 2018-06-28 NOTE — Discharge Summary (Signed)
[name], is a 77 y.o. female  DOB [birth year]  MRN [id number].  Admission date:  06/26/2018  Admitting Physician  Fritzi Mandes, MD  Discharge Date:  06/28/2018   Primary MD  Steele Sizer, MD  Recommendations for primary care physician for things to follow:  Follow-up with PCP in 1 week Follow-up with Dr. Esmond Plants  As scheduled.   Admission Diagnosis  Hypokalemia [E87.6] Acute cystitis with hematuria [N30.01] Acute kidney injury superimposed on chronic kidney disease ([place]) [N17.9, N18.9]   Discharge Diagnosis  Hypokalemia [E87.6] Acute cystitis with hematuria [N30.01] Acute kidney injury superimposed on chronic kidney disease ([place]) [N17.9, N18.9]   Active Problems:   Acute on chronic renal failure [facility])      Past Medical History:  Diagnosis Date  . Bronchitis   . Chronic combined systolic (congestive) and diastolic (congestive) heart failure ([place])    a. 02/2018 Echo: EF 45-50%, diff HK, triv AI, mod MR, mildly dil LA/RA, nl RV fxn, mod TR. PASP 40-65mmHg.  Marland Kitchen DJD (degenerative joint disease), cervical   . History of stress test    a. 01/2008 MV: EF 76%. Fair ex tol. No ischemia.  . Hyperlipidemia   . Hypertension   . Hypothyroid   . LBBB (left bundle branch block)   . Leg pain    a. 01/2018 ABI's wnl.  . Persistent atrial fibrillation ([place])    a. 01/2018 Event monitor: 45 runs of SVT, longes 14.5 sec. SVT felt to be Afib/flutter-->2% burden. Longest run of AF 4h 8m (CHA2DS2VASc = 4-->Xarelto).  . 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 CARDIOVERSION N/A 03/26/2018   Procedure: CARDIOVERSION;  Surgeon: Nelva Bush, MD;  Location: [facility] ORS;  Service: Cardiovascular;  Laterality: N/A;  . CARDIOVERSION  N/A 04/24/2018   Procedure: CARDIOVERSION;  Surgeon: Minna Merritts, MD;  Location: [facility] ORS;  Service: Cardiovascular;  Laterality: N/A;  . ELECTROPHYSIOLOGY STUDY N/A 02/27/2012   Procedure: ELECTROPHYSIOLOGY STUDY;  Surgeon: Evans Lance, MD;  Location: [facility] CATH LAB;  Service: Cardiovascular;  Laterality: N/A;  . EPS and ablation for SVT  5/13   slow pathway ablation by Dr Lovena Le  . EYE SURGERY     surgery for detatched retina  . SUPRAVENTRICULAR TACHYCARDIA ABLATION N/A 02/27/2012   Procedure: SUPRAVENTRICULAR TACHYCARDIA ABLATION;  Surgeon: Evans Lance, MD;  Location: [facility] CATH LAB;  Service: Cardiovascular;  Laterality: N/A;       History of present illness and  Hospital Course:     Kindly see H&P for history of present illness and admission details, please review complete Labs, Consult reports and Test reports for all details in brief  HPI  from the history and physical done on the day of admission 77 year old female patient admitted for diarrhea, back pain and chills.  She has history of degenerative disease of the neck, hypertension, hyperlipidemia, hypothyroidism, chronic atrial fibrillation.  Hospital Course  1.  Acute on chronic renal failure secondary to dehydration in the setting of acute cystitis: Received IV fluids, patient will function improved, patient creatinine was 1.57 on admission decreased to 1.06.  Patient has CKD stage III. 2./Acute cystitis: Patient received IV Rocephin, her dysuria symptoms improved, urine cultures are added to ER sepcimen that was sent,urine cultures not done from ER before starting abx.empirically given for cipro for 5 days. #3 chronic atrial fibrillation, rate controlled, continue metoprolol,  amiodarone, Eliquis. 4.  Chronic systolic heart failure with EF 45%, follow-up with Dr. Esmond Plants, patient takes Lasix at home we did not give on this admission because of acute renal failure and dehydration, advised the patient to continue Lasix as  scheduled before, as advised by the cardiologist and follow his guidelines to take extra Lasix.  We are going to give him 1 dose of Lasix 40 mg IV 1 time before she goes home today.   Discharge Condition: Stable   Follow UP  Follow-up Information    Steele Sizer, MD. Schedule an appointment as soon as possible for a visit on 07/29/2018.   Specialty:  Family Medicine Why:  Appointment Time: @ 11:20am office will call if they are able to see her on the 13th as requested. Contact information: [address] [place] [address] [phone]        Minna Merritts, MD On 07/24/2018.   Specialty:  Cardiology Why:  Appointment Time: @ 9:30AM Contact information: [place] [address] [phone]             Discharge Instructions  and  Discharge Medications     Allergies as of 06/28/2018      Reactions   Codeine Nausea And Vomiting   Erythromycin Itching, Swelling   Septra [sulfamethoxazole-trimethoprim] Swelling   Penicillins Swelling, Rash   Has patient had a PCN reaction causing immediate rash, facial/tongue/throat swelling, SOB or lightheadedness with hypotension: Yes Has patient had a PCN reaction causing severe rash involving mucus membranes or skin necrosis: No Has patient had a PCN reaction that required hospitalization: No Has patient had a PCN reaction occurring within the last 10 years: No If all of the above answers are "NO", then may proceed with Cephalosporin use.      Medication List    TAKE these medications   acetaminophen 650 MG CR tablet Commonly known as:  TYLENOL Take 1,300 mg by mouth every 8 (eight) hours as needed for pain.   amiodarone 200 MG tablet Commonly known as:  PACERONE Take 1 tablet (200 mg total) by mouth daily. What changed:  when to take this   apixaban 5 MG Tabs tablet Commonly known as:  ELIQUIS Take 1 tablet (5 mg total) by mouth 2 (two) times daily.   atorvastatin 20 MG  tablet Commonly known as:  LIPITOR TAKE 1 TABLET(20 MG) BY MOUTH DAILY AT 6 PM   busPIRone 5 MG tablet Commonly known as:  BUSPAR Take 1 tablet (5 mg total) by mouth 2 (two) times daily.   cholecalciferol 1000 units tablet Commonly known as:  VITAMIN D Take 2,000 Units by mouth daily.   ciprofloxacin 500 MG tablet Commonly known as:  CIPRO Take 1 tablet (500 mg total) by mouth 2 (two) times daily for 5 days.   doxazosin 1 MG tablet Commonly known as:  CARDURA Take 1 tablet (1 mg total) by mouth daily.   ezetimibe 10 MG tablet Commonly known as:  ZETIA Take 1 tablet (10 mg total) by mouth daily.   furosemide 20 MG tablet Commonly known as:  LASIX Take 1 tablet (20 mg total) by mouth daily.   levothyroxine 137 MCG tablet Commonly known as:  SYNTHROID, LEVOTHROID TAKE 1 TABLET(137 MCG) BY MOUTH EVERY MORNING   metoprolol succinate 50 MG 24 hr tablet Commonly known as:  TOPROL-XL Take 1 tablet (50 mg total) by mouth 2 (two) times daily.   multivitamin with minerals Tabs tablet  Take 1 tablet by mouth daily.   potassium chloride 10 MEQ tablet Commonly known as:  K-DUR Take 1 tablet (10 mEq total) by mouth daily.   temazepam 15 MG capsule Commonly known as:  RESTORIL Take 1 capsule (15 mg total) by mouth at bedtime as needed for sleep.   valsartan 320 MG tablet Commonly known as:  DIOVAN Take 1 tablet (320 mg total) by mouth daily.         Diet and Activity recommendation: See Discharge Instructions above   Consults obtained -none   Major procedures and Radiology Reports - PLEASE review detailed and final reports for all details, in brief -      No results found.  Micro Results     No results found for this or any previous visit (from the past 240 hour(s)).     Today   Subjective:   [name] today has no headache,no chest abdominal pain,no new weakness tingling or numbness, feels much better wants to go home today.   Objective:    Blood pressure (!) 139/59, pulse 65, temperature 97.9 F (36.6 C), temperature source Oral, resp. rate 18, height 5\' 6"  (1.676 m), weight 65.3 kg, SpO2 90 %.   Intake/Output Summary (Last 24 hours) at 06/28/2018 1117 Last data filed at 06/28/2018 1028 Gross per 24 hour  Intake 720 ml  Output 800 ml  Net -80 ml    Exam Awake Alert, Oriented x 3, No new F.N deficits, Normal affect [place].AT,PERRAL Supple Neck,No JVD, No cervical lymphadenopathy appriciated.  Symmetrical Chest wall movement, Good air movement bilaterally, CTAB RRR,No Gallops,Rubs or new Murmurs, No Parasternal Heave +ve B.Sounds, Abd Soft, Non tender, No organomegaly appriciated, No rebound -guarding or rigidity. No Cyanosis, Clubbing or edema, No new Rash or bruise  Data Review   CBC w Diff:  Lab Results  Component Value Date   WBC 13.1 (H) 06/26/2018   HGB 12.3 06/26/2018   HGB 13.2 02/24/2012   HCT 34.4 (L) 06/26/2018   HCT 38.0 02/24/2012   PLT 178 06/26/2018   PLT 187 02/24/2012   LYMPHOPCT 2 06/26/2018   LYMPHOPCT 26.3 02/24/2012   MONOPCT 3 06/26/2018   MONOPCT 6.3 02/24/2012   EOSPCT 0 06/26/2018   EOSPCT 1.2 02/24/2012   BASOPCT 0 06/26/2018   BASOPCT 0.4 02/24/2012    CMP:  Lab Results  Component Value Date   NA 135 06/28/2018   NA 141 06/06/2016   NA 143 02/24/2012   K 4.9 06/28/2018   K 3.4 (L) 02/24/2012   CL 105 06/28/2018   CL 109 (H) 02/24/2012   CO2 24 06/28/2018   CO2 27 02/24/2012   BUN 20 06/28/2018   BUN 12 06/06/2016   BUN 18 02/24/2012   CREATININE 1.26 (H) 06/28/2018   CREATININE 1.01 (H) 04/05/2018   PROT 6.9 04/22/2018   PROT 7.1 06/06/2016   PROT 7.3 02/23/2012   ALBUMIN 4.4 04/22/2018   ALBUMIN 4.7 06/06/2016   ALBUMIN 4.0 02/23/2012   BILITOT 0.7 04/22/2018   BILITOT 0.6 06/06/2016   BILITOT 0.5 02/23/2012   ALKPHOS 64 04/22/2018   ALKPHOS 57 02/23/2012   AST 34 04/22/2018   AST 27 02/23/2012   ALT 29 04/22/2018   ALT 21 02/23/2012  .   Total Time in  preparing paper work, data evaluation and todays exam - 59 minutes  Epifanio Lesches M.D on 06/28/2018 at 11:17 AM    Note: This dictation was prepared with Dragon dictation along with smaller phrase  technology. Any transcriptional errors that result from this process are unintentional.

## 2018-06-28 NOTE — Telephone Encounter (Signed)
Copied from [place] [id number]. Topic: General - Other >> Jun 28, 2018 11:00 AM Yvette Rack wrote: Reason for CRM: Pt scheduled for hospital follow up for 07/29/18 at 11:20 am. Kenney Houseman in 2-A states pt will be discharged today 06/28/18 and it is requested that she see pcp by 07/05/18. Please call pt to schedule sooner appt.

## 2018-06-28 NOTE — Progress Notes (Signed)
IVs and tele removed from patient. Discharge instructions given to patient along. Verbalized understanding. No acute distress. Husband at bedside wil transport patient home.

## 2018-06-30 LAB — URINE CULTURE: Culture: NO GROWTH

## 2018-07-01 ENCOUNTER — Telehealth: Payer: Self-pay

## 2018-07-01 NOTE — Telephone Encounter (Signed)
TOC #1. Called pt to f/u after d/c from [facility] on 06/28/18. Also wanted to confirm her hosp f/u appt w/ Dr. Ancil Boozer on 07/04/18 @ 11:40am. Discharge planning includes the following:  -  Begin Cipro - f/u Dr. Ancil Boozer 1 week - f/u Dr. Rockey Situ as scheduled - Continue heart healthy diet LVM requesting returned call.

## 2018-07-01 NOTE — Progress Notes (Signed)
Patient ID: EDIN SKARDA, female    DOB: [birth year], 77 y.o.   MRN: [id number]  HPI  Ms Horsey is a 77 y/o female with a history of atrial fibrillation, hyperlipidemia, HTN, thyroid disease, SVT, DJD and chronic heart failure.   Echo report from 03/21/18 reviewed and showed an EF of 45-50% along with moderate MR.   Admitted 06/26/18 due to acute on chronic renal failure secondary to dehydration. Received IV fluids. Given antibiotics due to UTI. Discharged after 2 days. Admitted 04/22/18 due to acute on chronic HF along with atrial fibrillation with RVR. Initially needed bipap and oxygen but was then weaned off oxygen. Cardiology consult obtained. Cardioverted in June and again, during this admission. Discharged after 3 days.   She presents today for a follow-up visit with a chief complaint of moderate fatigue upon minimal exertion. She says that this has worsened since her recent hospitalization. She has associated neck pain, light-headedness and easy bruising along with this. She has noticed a change in her test buds and decrease in her ability to smell since she's been taking antibiotics. She denies any difficulty sleeping, abdominal distention, palpitations, pedal edema, chest pain, shortness of breath or chest pain.   Past Medical History:  Diagnosis Date  . Bronchitis   . Chronic combined systolic (congestive) and diastolic (congestive) heart failure ([place])    a. 02/2018 Echo: EF 45-50%, diff HK, triv AI, mod MR, mildly dil LA/RA, nl RV fxn, mod TR. PASP 40-70mmHg.  Marland Kitchen DJD (degenerative joint disease), cervical   . History of stress test    a. 01/2008 MV: EF 76%. Fair ex tol. No ischemia.  . Hyperlipidemia   . Hypertension   . Hypothyroid   . LBBB (left bundle branch block)   . Leg pain    a. 01/2018 ABI's wnl.  . Persistent atrial fibrillation ([place])    a. 01/2018 Event monitor: 45 runs of SVT, longes 14.5 sec. SVT felt to be Afib/flutter-->2% burden. Longest run of AF 4h 61m (CHA2DS2VASc =  4-->Xarelto).  . 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 CARDIOVERSION N/A 03/26/2018   Procedure: CARDIOVERSION;  Surgeon: Nelva Bush, MD;  Location: [facility] ORS;  Service: Cardiovascular;  Laterality: N/A;  . CARDIOVERSION N/A 04/24/2018   Procedure: CARDIOVERSION;  Surgeon: Minna Merritts, MD;  Location: [facility] ORS;  Service: Cardiovascular;  Laterality: N/A;  . ELECTROPHYSIOLOGY STUDY N/A 02/27/2012   Procedure: ELECTROPHYSIOLOGY STUDY;  Surgeon: Evans Lance, MD;  Location: [facility] CATH LAB;  Service: Cardiovascular;  Laterality: N/A;  . EPS and ablation for SVT  5/13   slow pathway ablation by Dr Lovena Le  . EYE SURGERY     surgery for detatched retina  . SUPRAVENTRICULAR TACHYCARDIA ABLATION N/A 02/27/2012   Procedure: SUPRAVENTRICULAR TACHYCARDIA ABLATION;  Surgeon: Evans Lance, MD;  Location: [facility] CATH LAB;  Service: Cardiovascular;  Laterality: N/A;   Family History  Problem Relation Age of Onset  . Alzheimer's disease Mother   . Stroke Father   . Breast cancer Father   . Heart disease Father        Pacemaker  . Breast cancer Paternal Aunt   . Kidney cancer Maternal Grandmother   . Alzheimer's disease Maternal Grandfather   . Thyroid disease Paternal Grandmother   . Stroke Paternal Grandfather    Social History   Tobacco Use  . Smoking status: Never Smoker  .  Smokeless tobacco: Never Used  . Tobacco comment: smoking cessation materials not required  Substance Use Topics  . Alcohol use: No   Allergies  Allergen Reactions  . Codeine Nausea And Vomiting  . Erythromycin Itching and Swelling  . Septra [Sulfamethoxazole-Trimethoprim] Swelling  . Penicillins Swelling and Rash    Has patient had a PCN reaction causing immediate rash, facial/tongue/throat swelling, SOB or lightheadedness with hypotension: Yes Has patient had a PCN reaction causing severe rash involving mucus  membranes or skin necrosis: No Has patient had a PCN reaction that required hospitalization: No Has patient had a PCN reaction occurring within the last 10 years: No If all of the above answers are "NO", then may proceed with Cephalosporin use.    Prior to Admission medications   Medication Sig Start Date End Date Taking? Authorizing Provider  acetaminophen (TYLENOL) 650 MG CR tablet Take 1,300 mg by mouth every 8 (eight) hours as needed for pain.   Yes [provider]  amiodarone (PACERONE) 200 MG tablet Take 1 tablet (200 mg total) by mouth daily. Patient taking differently: Take 200 mg by mouth 2 (two) times daily.  05/07/18  Yes Gollan, Kathlene November, MD  apixaban (ELIQUIS) 5 MG TABS tablet Take 1 tablet (5 mg total) by mouth 2 (two) times daily. 05/17/18  Yes Sowles, Drue Stager, MD  atorvastatin (LIPITOR) 20 MG tablet TAKE 1 TABLET(20 MG) BY MOUTH DAILY AT 6 PM 04/06/18  Yes Sowles, Drue Stager, MD  busPIRone (BUSPAR) 5 MG tablet Take 1 tablet (5 mg total) by mouth 2 (two) times daily. 02/15/18  Yes Sowles, Drue Stager, MD  cholecalciferol (VITAMIN D) 1000 UNITS tablet Take 2,000 Units by mouth daily.   Yes [provider]  ciprofloxacin (CIPRO) 500 MG tablet Take 1 tablet (500 mg total) by mouth 2 (two) times daily for 5 days. 06/28/18 07/03/18 Yes Epifanio Lesches, MD  doxazosin (CARDURA) 1 MG tablet Take 1 tablet (1 mg total) by mouth daily. 05/07/18  Yes Minna Merritts, MD  ezetimibe (ZETIA) 10 MG tablet Take 1 tablet (10 mg total) by mouth daily. 01/29/18  Yes Gollan, Kathlene November, MD  furosemide (LASIX) 20 MG tablet Take 1 tablet (20 mg total) by mouth daily. 05/17/18  Yes Sowles, Drue Stager, MD  levothyroxine (SYNTHROID, LEVOTHROID) 137 MCG tablet TAKE 1 TABLET(137 MCG) BY MOUTH EVERY MORNING 04/06/18  Yes Sowles, Drue Stager, MD  metoprolol succinate (TOPROL-XL) 50 MG 24 hr tablet Take 1 tablet (50 mg total) by mouth 2 (two) times daily. 04/04/18  Yes Gollan, Kathlene November, MD  Multiple Vitamin  (MULITIVITAMIN WITH MINERALS) TABS Take 1 tablet by mouth daily.   Yes [provider]  potassium chloride (K-DUR) 10 MEQ tablet Take 1 tablet (10 mEq total) by mouth daily. 05/17/18 07/16/18 Yes Sowles, Drue Stager, MD  temazepam (RESTORIL) 15 MG capsule Take 1 capsule (15 mg total) by mouth at bedtime as needed for sleep. 05/17/18  Yes Sowles, Drue Stager, MD  valsartan (DIOVAN) 320 MG tablet Take 1 tablet (320 mg total) by mouth daily. 05/17/18 07/16/18 Yes Steele Sizer, MD    Review of Systems  Constitutional: Positive for fatigue (easily). Negative for appetite change.  HENT: Negative for congestion, postnasal drip and sore throat.   Eyes: Negative.   Respiratory: Negative for chest tightness and shortness of breath.   Cardiovascular: Negative for chest pain, palpitations and leg swelling.  Gastrointestinal: Negative for abdominal distention and abdominal pain.  Endocrine: Negative.   Genitourinary: Negative.   Musculoskeletal: Positive for neck pain. Negative  for back pain.  Skin: Negative.   Allergic/Immunologic: Negative.   Neurological: Positive for light-headedness (on occasion). Negative for dizziness.  Hematological: Negative for adenopathy. Bruises/bleeds easily.  Psychiatric/Behavioral: Negative for dysphoric mood and sleep disturbance (sleeping on 1 pillow). The patient is not nervous/anxious.    Vitals:   07/02/18 0855  BP: (!) 167/63  Pulse: 72  Resp: 18  SpO2: 100%  Weight: 144 lb 6 oz (65.5 kg)  Height: 5\' 6"  (1.676 m)   Wt Readings from Last 3 Encounters:  07/02/18 144 lb 6 oz (65.5 kg)  06/28/18 144 lb 0.8 oz (65.3 kg)  05/24/18 148 lb 4 oz (67.2 kg)   Lab Results  Component Value Date   CREATININE 1.26 (H) 06/28/2018   CREATININE 1.39 (H) 06/27/2018   CREATININE 1.57 (H) 06/26/2018    Physical Exam  Constitutional: She is oriented to person, place, and time. She appears well-developed and well-nourished.  HENT:  Head: Normocephalic and atraumatic.   Neck: Normal range of motion. Neck supple. No JVD present.  Cardiovascular: Normal rate and regular rhythm.  Pulmonary/Chest: Effort normal. No respiratory distress. She has no wheezes. She has no rales.  Abdominal: Soft. She exhibits no distension. There is no tenderness.  Musculoskeletal: She exhibits no edema or tenderness.  Neurological: She is alert and oriented to person, place, and time.  Skin: Skin is warm and dry.  Psychiatric: She has a normal mood and affect. Her behavior is normal. Thought content normal.  Nursing note and vitals reviewed.  Assessment & Plan:  1: Chronic heart failure with mildly reduced ejection fraction- - NYHA class III - euvolemic today - weighing daily and she says that her weight has been stable. Reminded her to call for an overnight weight gain gain of >2 pounds or a weekly weight gain of >5 pounds - weight down 4 pounds since she was last here 1 month ago but she says that her smell/taste have changed since antibiotic use - not adding salt and has been reading food labels. Reminded to closely follow a 2000mg  sodium diet - EF >40% so would not qualify for entresto; continue valsartan - feels more fatigue since her UTI/ dehydration hospitalization; says that she was walking 6 laps around [employer] and yesterday she could only walk 1 lap - encouraged her to increase her activity slowly with allowing for frequent rest periods - BNP 04/22/18 was 614.0  2: HTN- - BP elevated today - could titrate up her metoprolol but will hold off today since she's still on cipro and already feels quite fatigued - she is to continue to monitor her BP at home and call us back if BP remains elevated - saw PCP Ancil Boozer) 05/17/18 and returns 07/04/18 - BMP 06/28/18 reviewed and showed sodium 135, potassium 4.9, creatinine 1.26 and GFR 41  3: Atrial fibrillation- - saw cardiology Rockey Situ) 05/07/18 and returns October - currently rate controlled  Medication list was  reviewed.  Return in 6 months or sooner for any questions/problems before then.   Marland Kitchen

## 2018-07-02 ENCOUNTER — Telehealth: Payer: Self-pay

## 2018-07-02 ENCOUNTER — Ambulatory Visit: Payer: Medicare Other | Attending: Family | Admitting: Family

## 2018-07-02 ENCOUNTER — Encounter: Payer: Self-pay | Admitting: Family

## 2018-07-02 VITALS — BP 167/63 | HR 72 | Resp 18 | Ht 66.0 in | Wt 144.4 lb

## 2018-07-02 DIAGNOSIS — Z79890 Hormone replacement therapy (postmenopausal): Secondary | ICD-10-CM | POA: Insufficient documentation

## 2018-07-02 DIAGNOSIS — N189 Chronic kidney disease, unspecified: Secondary | ICD-10-CM | POA: Diagnosis not present

## 2018-07-02 DIAGNOSIS — Z7901 Long term (current) use of anticoagulants: Secondary | ICD-10-CM | POA: Diagnosis not present

## 2018-07-02 DIAGNOSIS — E785 Hyperlipidemia, unspecified: Secondary | ICD-10-CM | POA: Diagnosis not present

## 2018-07-02 DIAGNOSIS — I447 Left bundle-branch block, unspecified: Secondary | ICD-10-CM | POA: Insufficient documentation

## 2018-07-02 DIAGNOSIS — E039 Hypothyroidism, unspecified: Secondary | ICD-10-CM | POA: Diagnosis not present

## 2018-07-02 DIAGNOSIS — Z880 Allergy status to penicillin: Secondary | ICD-10-CM | POA: Insufficient documentation

## 2018-07-02 DIAGNOSIS — Z885 Allergy status to narcotic agent status: Secondary | ICD-10-CM | POA: Diagnosis not present

## 2018-07-02 DIAGNOSIS — Z79899 Other long term (current) drug therapy: Secondary | ICD-10-CM | POA: Diagnosis not present

## 2018-07-02 DIAGNOSIS — Z8249 Family history of ischemic heart disease and other diseases of the circulatory system: Secondary | ICD-10-CM | POA: Diagnosis not present

## 2018-07-02 DIAGNOSIS — I10 Essential (primary) hypertension: Secondary | ICD-10-CM

## 2018-07-02 DIAGNOSIS — I471 Supraventricular tachycardia: Secondary | ICD-10-CM | POA: Insufficient documentation

## 2018-07-02 DIAGNOSIS — Z8349 Family history of other endocrine, nutritional and metabolic diseases: Secondary | ICD-10-CM | POA: Diagnosis not present

## 2018-07-02 DIAGNOSIS — I130 Hypertensive heart and chronic kidney disease with heart failure and stage 1 through stage 4 chronic kidney disease, or unspecified chronic kidney disease: Secondary | ICD-10-CM | POA: Diagnosis not present

## 2018-07-02 DIAGNOSIS — I5042 Chronic combined systolic (congestive) and diastolic (congestive) heart failure: Secondary | ICD-10-CM | POA: Diagnosis not present

## 2018-07-02 DIAGNOSIS — Z882 Allergy status to sulfonamides status: Secondary | ICD-10-CM | POA: Insufficient documentation

## 2018-07-02 DIAGNOSIS — M47892 Other spondylosis, cervical region: Secondary | ICD-10-CM | POA: Diagnosis not present

## 2018-07-02 DIAGNOSIS — Z881 Allergy status to other antibiotic agents status: Secondary | ICD-10-CM | POA: Diagnosis not present

## 2018-07-02 DIAGNOSIS — I481 Persistent atrial fibrillation: Secondary | ICD-10-CM | POA: Insufficient documentation

## 2018-07-02 DIAGNOSIS — I480 Paroxysmal atrial fibrillation: Secondary | ICD-10-CM

## 2018-07-02 DIAGNOSIS — R5383 Other fatigue: Secondary | ICD-10-CM | POA: Diagnosis present

## 2018-07-02 DIAGNOSIS — I5022 Chronic systolic (congestive) heart failure: Secondary | ICD-10-CM

## 2018-07-02 NOTE — Patient Instructions (Signed)
Continue weighing daily and call for an overnight weight gain of > 2 pounds or a weekly weight gain of >5 pounds. 

## 2018-07-02 NOTE — Telephone Encounter (Signed)
Transition Care Management Follow-up Telephone Call  Date of discharge and from where: [facility] on 06/28/18  How have you been since you were released from the hospital? States she is feeling "run down" but her urinary symptoms have subsided. Denies any adverse reactions after taking Cipro. Denies any fevers at this time.  Any questions or concerns? No   Items Reviewed:  Did the pt receive and understand the discharge instructions provided? Yes   Medications obtained and verified? Yes   Any new allergies since your discharge? No   Dietary orders reviewed? Yes  Do you have support at home? Yes   Other (ie: DME, Home Health, etc) N/A  Functional Questionnaire: (I = Independent and D = Dependent) ADL's: I  Bathing/Dressing- I   Meal Prep- I  Eating- I  Maintaining continence- I  Transferring/Ambulation- I  Managing Meds- I   Follow up appointments reviewed:    PCP Hospital f/u appt confirmed? Yes  Scheduled to see Dr. Ancil Boozer on 07/04/18 @ 11:40am.  [facility] f/u appt confirmed? N/A  Are transportation arrangements needed? No   If their condition worsens, is the pt aware to call  their PCP or go to the ED? Yes  Was the patient provided with contact information for the PCP's office or ED? Yes  Was the pt encouraged to call back with questions or concerns? Yes

## 2018-07-04 ENCOUNTER — Encounter: Payer: Self-pay | Admitting: Family Medicine

## 2018-07-04 ENCOUNTER — Ambulatory Visit (INDEPENDENT_AMBULATORY_CARE_PROVIDER_SITE_OTHER): Payer: Medicare Other | Admitting: Family Medicine

## 2018-07-04 VITALS — BP 152/68 | HR 74 | Temp 97.4°F | Resp 14 | Ht 66.0 in | Wt 142.8 lb

## 2018-07-04 DIAGNOSIS — E876 Hypokalemia: Secondary | ICD-10-CM

## 2018-07-04 DIAGNOSIS — I10 Essential (primary) hypertension: Secondary | ICD-10-CM | POA: Diagnosis not present

## 2018-07-04 DIAGNOSIS — R319 Hematuria, unspecified: Secondary | ICD-10-CM | POA: Diagnosis not present

## 2018-07-04 DIAGNOSIS — D72829 Elevated white blood cell count, unspecified: Secondary | ICD-10-CM

## 2018-07-04 DIAGNOSIS — Z09 Encounter for follow-up examination after completed treatment for conditions other than malignant neoplasm: Secondary | ICD-10-CM

## 2018-07-04 DIAGNOSIS — Z23 Encounter for immunization: Secondary | ICD-10-CM | POA: Diagnosis not present

## 2018-07-04 DIAGNOSIS — N183 Chronic kidney disease, stage 3 unspecified: Secondary | ICD-10-CM

## 2018-07-04 NOTE — Progress Notes (Signed)
Name: [name]   MRN: [id number]    DOB: 1941/07/10   Date:07/04/2018       Progress Note  Subjective  Chief Complaint  Chief Complaint  Patient presents with  . Hospitalization Follow-up    Admitted September 4 and Discharged on the 6th, Had symptoms of urinary burning and presure.   . Urinary Tract Infection    Cipro from the hospital has resolved her symptoms    HPI  Hospital follow up: she had developed symptoms of dysuria and supra pubic pressure for one week prior to going to [facility] on 09/04. She states the morning she got concerned about drop on her bp , weakness and difficulty walking. She called her boyfriend and when she arrived to [facility] she had hypokalemia, hypomagnesemia, acute on chronic kidney failure. She had leucocytosis. She was given fluids, antibiotics and replaced magnesium and potassium. She was discharged home 2 days later. Aimee called her after her discharge. She finished cipro yesterday. She still feels tired, but states a lot better today. Getting stronger, walked this morning at the mall. No fever or chills. Dysuria resolved. No nausea or vomiting, appetite is improving.    Patient Active Problem List   Diagnosis Date Noted  . Chronic heart failure ([place]) 05/24/2018  . Atrial fibrillation ([place]) 05/24/2018  . CKD (chronic kidney disease), stage III ([place]) 05/17/2018  . 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 Shortness of breath 05/07/2018  . Pulmonary hypertension, unspecified ([place]) 04/05/2018  . Chronic combined systolic (congestive) and diastolic (congestive) heart failure ([place]) 04/05/2018  . Persistent atrial fibrillation ([place]) 03/25/2018  . Paroxysmal sinus tachycardia ([place]) 01/29/2018  . Leg pain 01/04/2018  . PAD (peripheral artery disease) ([place]) 01/04/2018  . Carotid artery calcification, bilateral 12/19/2017  . Cervical radiculitis 12/19/2017  . Atrial fibrillation, new onset ([place]) 12/19/2017  . Hyperglycemia 03/06/2016  . Hypothyroid 04/21/2015  .  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 Anxiety 04/21/2015  . Hyperlipidemia 04/21/2015  . Diuretic-induced hypokalemia 04/21/2015  . Palpitations 12/18/2012  . Hypertension 05/27/2012    Past Surgical History:  Procedure Laterality Date  . CARDIOVERSION N/A 03/26/2018   Procedure: CARDIOVERSION;  Surgeon: Nelva Bush, MD;  Location: [facility] ORS;  Service: Cardiovascular;  Laterality: N/A;  . CARDIOVERSION N/A 04/24/2018   Procedure: CARDIOVERSION;  Surgeon: Minna Merritts, MD;  Location: [facility] ORS;  Service: Cardiovascular;  Laterality: N/A;  . ELECTROPHYSIOLOGY STUDY N/A 02/27/2012   Procedure: ELECTROPHYSIOLOGY STUDY;  Surgeon: Evans Lance, MD;  Location: [facility] CATH LAB;  Service: Cardiovascular;  Laterality: N/A;  . EPS and ablation for SVT  5/13   slow pathway ablation by Dr Lovena Le  . EYE SURGERY     surgery for detatched retina  . SUPRAVENTRICULAR TACHYCARDIA ABLATION N/A 02/27/2012   Procedure: SUPRAVENTRICULAR TACHYCARDIA ABLATION;  Surgeon: Evans Lance, MD;  Location: [facility] CATH LAB;  Service: Cardiovascular;  Laterality: N/A;    Family History  Problem Relation Age of Onset  . Alzheimer's disease Mother   . Stroke Father   . Breast cancer Father   . Heart disease Father        Pacemaker  . Breast cancer Paternal Aunt   . Kidney cancer Maternal Grandmother   . Alzheimer's disease Maternal Grandfather   . Thyroid disease Paternal Grandmother   . Stroke Paternal Grandfather     Social History   Socioeconomic History  . Marital status: Widowed    Spouse name: Not on file  . Number  of children: 1  . Years of education: Not on file  . Highest education level: 12th grade  Occupational History  . Occupation: Retired  Scientific laboratory technician  . Financial resource strain: Not hard at all  . Food insecurity:    Worry: Never true    Inability: Never true  . Transportation needs:    Medical: No    Non-medical: No  Tobacco Use  . Smoking status: Never  Smoker  . Smokeless tobacco: Never Used  . Tobacco comment: smoking cessation materials not required  Substance and Sexual Activity  . Alcohol use: No  . Drug use: No  . Sexual activity: Not Currently    Partners: Male  Lifestyle  . Physical activity:    Days per week: 0 days    Minutes per session: 0 min  . Stress: Not at all  Relationships  . Social connections:    Talks on phone: Patient refused    Gets together: Patient refused    Attends religious service: Patient refused    Active member of club or organization: Patient refused    Attends meetings of clubs or organizations: Patient refused    Relationship status: Widowed  . Intimate partner violence:    Fear of current or ex partner: No    Emotionally abused: No    Physically abused: No    Forced sexual activity: No  Other Topics Concern  . Not on file  Social History Narrative   Lives in [place]     Current Outpatient Medications:  .  acetaminophen (TYLENOL) 650 MG CR tablet, Take 1,300 mg by mouth every 8 (eight) hours as needed for pain., Disp: , Rfl:  .  amiodarone (PACERONE) 200 MG tablet, Take 1 tablet (200 mg total) by mouth daily. (Patient taking differently: Take 200 mg by mouth 2 (two) times daily. ), Disp: 90 tablet, Rfl: 3 .  apixaban (ELIQUIS) 5 MG TABS tablet, Take 1 tablet (5 mg total) by mouth 2 (two) times daily., Disp: 60 tablet, Rfl: 5 .  atorvastatin (LIPITOR) 20 MG tablet, TAKE 1 TABLET(20 MG) BY MOUTH DAILY AT 6 PM, Disp: 90 tablet, Rfl: 1 .  busPIRone (BUSPAR) 5 MG tablet, Take 1 tablet (5 mg total) by mouth 2 (two) times daily., Disp: 180 tablet, Rfl: 1 .  cholecalciferol (VITAMIN D) 1000 UNITS tablet, Take 2,000 Units by mouth daily., Disp: , Rfl:  .  doxazosin (CARDURA) 1 MG tablet, Take 1 tablet (1 mg total) by mouth daily., Disp: 30 tablet, Rfl: 11 .  ezetimibe (ZETIA) 10 MG tablet, Take 1 tablet (10 mg total) by mouth daily., Disp: 30 tablet, Rfl: 11 .  furosemide (LASIX) 20 MG tablet,  Take 1 tablet (20 mg total) by mouth daily., Disp: 90 tablet, Rfl: 1 .  levothyroxine (SYNTHROID, LEVOTHROID) 137 MCG tablet, TAKE 1 TABLET(137 MCG) BY MOUTH EVERY MORNING, Disp: 90 tablet, Rfl: 1 .  metoprolol succinate (TOPROL-XL) 50 MG 24 hr tablet, Take 1 tablet (50 mg total) by mouth 2 (two) times daily., Disp: , Rfl:  .  Multiple Vitamin (MULITIVITAMIN WITH MINERALS) TABS, Take 1 tablet by mouth daily., Disp: , Rfl:  .  potassium chloride (K-DUR) 10 MEQ tablet, Take 1 tablet (10 mEq total) by mouth daily., Disp: 90 tablet, Rfl: 1 .  temazepam (RESTORIL) 15 MG capsule, Take 1 capsule (15 mg total) by mouth at bedtime as needed for sleep., Disp: 30 capsule, Rfl: 2 .  valsartan (DIOVAN) 320 MG tablet, Take 1 tablet (320 mg  total) by mouth daily., Disp: 90 tablet, Rfl: 1  Allergies  Allergen Reactions  . Codeine Nausea And Vomiting  . Erythromycin Itching and Swelling  . Septra [Sulfamethoxazole-Trimethoprim] Swelling  . Penicillins Swelling and Rash    Has patient had a PCN reaction causing immediate rash, facial/tongue/throat swelling, SOB or lightheadedness with hypotension: Yes Has patient had a PCN reaction causing severe rash involving mucus membranes or skin necrosis: No Has patient had a PCN reaction that required hospitalization: No Has patient had a PCN reaction occurring within the last 10 years: No If all of the above answers are "NO", then may proceed with Cephalosporin use.      ROS  Constitutional: Negative for fever , positive for weight change.  Respiratory: Negative for cough and shortness of breath.   Cardiovascular: Negative for chest pain or palpitations.  Gastrointestinal: Negative for abdominal pain, no bowel changes.  Musculoskeletal: Negative for gait problem or joint swelling.  Skin: Negative for rash.  Neurological: Negative for dizziness , positive for mild headache.  No other specific complaints in a complete review of systems (except as listed in HPI  above).  Objective  Vitals:   07/04/18 1202  BP: (!) 152/68  Pulse: 74  Resp: 14  Temp: (!) 97.4 F (36.3 C)  TempSrc: Oral  SpO2: 99%  Weight: 142 lb 12.8 oz (64.8 kg)  Height: 5' 6" (1.676 m)    Body mass index is 23.05 kg/m.  Physical Exam  Constitutional: Patient appears well-developed and well-nourished.No distress.  HEENT: head atraumatic, normocephalic, pupils equal and reactive to light neck supple, throat within normal limits Cardiovascular: Normal rate, regular rhythm and normal heart sounds.  No murmur heard. No BLE edema. Pulmonary/Chest: Effort normal and breath sounds normal. No respiratory distress. Abdominal: Soft.  There is no tenderness. Negative CVA tenderness Psychiatric: Patient has a normal mood and affect. behavior is normal. Judgment and thought content normal.  Recent Results (from the past 2160 hour(s))  Comprehensive metabolic panel     Status: Abnormal   Collection Time: 04/22/18  6:16 AM  Result Value Ref Range   Sodium 138 135 - 145 mmol/L   Potassium 3.3 (L) 3.5 - 5.1 mmol/L   Chloride 105 98 - 111 mmol/L    Comment: Please note change in reference range.   CO2 23 22 - 32 mmol/L   Glucose, Bld 224 (H) 70 - 99 mg/dL    Comment: Please note change in reference range.   BUN 19 8 - 23 mg/dL    Comment: Please note change in reference range.   Creatinine, Ser 0.95 0.44 - 1.00 mg/dL   Calcium 8.9 8.9 - 10.3 mg/dL   Total Protein 6.9 6.5 - 8.1 g/dL   Albumin 4.4 3.5 - 5.0 g/dL   AST 34 15 - 41 U/L   ALT 29 0 - 44 U/L    Comment: Please note change in reference range.   Alkaline Phosphatase 64 38 - 126 U/L   Total Bilirubin 0.7 0.3 - 1.2 mg/dL   GFR calc non Af Amer 57 (L) >60 mL/min   GFR calc Af Amer >60 >60 mL/min    Comment: (NOTE) The eGFR has been calculated using the CKD EPI equation. This calculation has not been validated in all clinical situations. eGFR's persistently <60 mL/min signify possible Chronic Kidney Disease.     Anion gap 10 5 - 15    Comment: Performed at [facility], [address] [place], [place] [address]  Brain natriuretic peptide     Status: Abnormal   Collection Time: 04/22/18  6:16 AM  Result Value Ref Range   B Natriuretic Peptide 614.0 (H) 0.0 - 100.0 pg/mL    Comment: Performed at [facility], [address] [place], [place] [address]  Troponin I     Status: None   Collection Time: 04/22/18  6:16 AM  Result Value Ref Range   Troponin I <0.03 <0.03 ng/mL    Comment: Performed at [facility], [address] [place], [place] [address]  CBC with Differential     Status: Abnormal   Collection Time: 04/22/18  6:16 AM  Result Value Ref Range   WBC 8.0 3.6 - 11.0 K/uL   RBC 4.22 3.80 - 5.20 MIL/uL   Hemoglobin 13.2 12.0 - 16.0 g/dL   HCT 38.3 35.0 - 47.0 %   MCV 90.6 80.0 - 100.0 fL   MCH 31.2 26.0 - 34.0 pg   MCHC 34.4 32.0 - 36.0 g/dL   RDW 15.6 (H) 11.5 - 14.5 %   Platelets 341 150 - 440 K/uL   Neutrophils Relative % 75 %   Neutro Abs 6.1 1.4 - 6.5 K/uL   Lymphocytes Relative 18 %   Lymphs Abs 1.4 1.0 - 3.6 K/uL   Monocytes Relative 5 %   Monocytes Absolute 0.4 0.2 - 0.9 K/uL   Eosinophils Relative 1 %   Eosinophils Absolute 0.1 0 - 0.7 K/uL   Basophils Relative 1 %   Basophils Absolute 0.0 0 - 0.1 K/uL    Comment: Performed at [facility], [address] [place], [place] [address]  Magnesium     Status: None   Collection Time: 04/22/18  6:16 AM  Result Value Ref Range   Magnesium 2.2 1.7 - 2.4 mg/dL    Comment: Performed at [facility], [address] [place], [place] [address]  MRSA PCR Screening     Status: None   Collection Time: 04/22/18  8:29 AM  Result Value Ref Range   MRSA by PCR NEGATIVE NEGATIVE    Comment:        The GeneXpert MRSA Assay (FDA approved for NASAL specimens only), is one component of a comprehensive MRSA colonization surveillance program. It is not intended to diagnose  MRSA infection nor to guide or monitor treatment for MRSA infections. Performed at [facility], [address] [place],  [address]   Glucose, capillary     Status: Abnormal   Collection Time: 04/22/18  8:29 AM  Result Value Ref Range   Glucose-Capillary 123 (H) 70 - 99 mg/dL  Basic metabolic panel     Status: Abnormal   Collection Time: 04/23/18  4:24 AM  Result Value Ref Range   Sodium 136 135 - 145 mmol/L   Potassium 3.2 (L) 3.5 - 5.1 mmol/L   Chloride 99 98 - 111 mmol/L    Comment: Please note change in reference range.   CO2 25 22 - 32 mmol/L   Glucose, Bld 130 (H) 70 - 99 mg/dL    Comment: Please note change in reference range.   BUN 19 8 - 23 mg/dL    Comment: Please note change in reference range.   Creatinine, Ser 0.86 0.44 - 1.00 mg/dL   Calcium 8.7 (L) 8.9 - 10.3 mg/dL   GFR calc non Af Amer >60 >60 mL/min   GFR calc Af Amer >60 >60 mL/min    Comment: (NOTE) The eGFR has been  calculated using the CKD EPI equation. This calculation has not been validated in all clinical situations. eGFR's persistently <60 mL/min signify possible Chronic Kidney Disease.    Anion gap 12 5 - 15    Comment: Performed at [facility], [address] [place], [place] [address]  CBC     Status: Abnormal   Collection Time: 04/23/18  4:24 AM  Result Value Ref Range   WBC 9.0 3.6 - 11.0 K/uL   RBC 4.12 3.80 - 5.20 MIL/uL   Hemoglobin 12.7 12.0 - 16.0 g/dL   HCT 37.1 35.0 - 47.0 %   MCV 90.1 80.0 - 100.0 fL   MCH 30.7 26.0 - 34.0 pg   MCHC 34.1 32.0 - 36.0 g/dL   RDW 15.4 (H) 11.5 - 14.5 %   Platelets 306 150 - 440 K/uL    Comment: Performed at [facility], [address] [place], [place] [address]  Basic metabolic panel     Status: Abnormal   Collection Time: 04/24/18  5:15 AM  Result Value Ref Range   Sodium 138 135 - 145 mmol/L   Potassium 4.2 3.5 - 5.1 mmol/L   Chloride 102 98 - 111 mmol/L    Comment: Please note change in reference  range.   CO2 25 22 - 32 mmol/L   Glucose, Bld 141 (H) 70 - 99 mg/dL    Comment: Please note change in reference range.   BUN 25 (H) 8 - 23 mg/dL    Comment: Please note change in reference range.   Creatinine, Ser 1.05 (H) 0.44 - 1.00 mg/dL   Calcium 9.2 8.9 - 10.3 mg/dL   GFR calc non Af Amer 51 (L) >60 mL/min   GFR calc Af Amer 59 (L) >60 mL/min    Comment: (NOTE) The eGFR has been calculated using the CKD EPI equation. This calculation has not been validated in all clinical situations. eGFR's persistently <60 mL/min signify possible Chronic Kidney Disease.    Anion gap 11 5 - 15    Comment: Performed at [facility], [address] [place], [place] [address]  CBC WITH DIFFERENTIAL     Status: Abnormal   Collection Time: 04/24/18  5:15 AM  Result Value Ref Range   WBC 8.8 3.6 - 11.0 K/uL   RBC 4.38 3.80 - 5.20 MIL/uL   Hemoglobin 13.3 12.0 - 16.0 g/dL   HCT 38.9 35.0 - 47.0 %   MCV 88.9 80.0 - 100.0 fL   MCH 30.3 26.0 - 34.0 pg   MCHC 34.1 32.0 - 36.0 g/dL   RDW 15.2 (H) 11.5 - 14.5 %   Platelets 319 150 - 440 K/uL   Neutrophils Relative % 70 %   Neutro Abs 6.2 1.4 - 6.5 K/uL   Lymphocytes Relative 20 %   Lymphs Abs 1.7 1.0 - 3.6 K/uL   Monocytes Relative 9 %   Monocytes Absolute 0.8 0.2 - 0.9 K/uL   Eosinophils Relative 1 %   Eosinophils Absolute 0.1 0 - 0.7 K/uL   Basophils Relative 0 %   Basophils Absolute 0.0 0 - 0.1 K/uL    Comment: Performed at [facility], [address] [place], [place] [address]  Basic metabolic panel     Status: Abnormal   Collection Time: 04/25/18  4:25 AM  Result Value Ref Range   Sodium 138 135 - 145 mmol/L   Potassium 4.3 3.5 - 5.1 mmol/L   Chloride 104 98 - 111 mmol/L  Comment: Please note change in reference range.   CO2 28 22 - 32 mmol/L   Glucose, Bld 124 (H) 70 - 99 mg/dL    Comment: Please note change in reference range.   BUN 31 (H) 8 - 23 mg/dL    Comment: Please note change in reference range.    Creatinine, Ser 1.13 (H) 0.44 - 1.00 mg/dL   Calcium 8.9 8.9 - 10.3 mg/dL   GFR calc non Af Amer 46 (L) >60 mL/min   GFR calc Af Amer 54 (L) >60 mL/min    Comment: (NOTE) The eGFR has been calculated using the CKD EPI equation. This calculation has not been validated in all clinical situations. eGFR's persistently <60 mL/min signify possible Chronic Kidney Disease.    Anion gap 6 5 - 15    Comment: Performed at [facility], [address] [place], [place] [address]  CBC with Differential/Platelet     Status: Abnormal   Collection Time: 06/26/18  7:33 AM  Result Value Ref Range   WBC 13.1 (H) 3.6 - 11.0 K/uL   RBC 3.75 (L) 3.80 - 5.20 MIL/uL   Hemoglobin 12.3 12.0 - 16.0 g/dL   HCT 34.4 (L) 35.0 - 47.0 %   MCV 91.5 80.0 - 100.0 fL   MCH 32.6 26.0 - 34.0 pg   MCHC 35.7 32.0 - 36.0 g/dL   RDW 17.2 (H) 11.5 - 14.5 %   Platelets 178 150 - 440 K/uL   Neutrophils Relative % 95 %   Neutro Abs 12.3 (H) 1.4 - 6.5 K/uL   Lymphocytes Relative 2 %   Lymphs Abs 0.3 (L) 1.0 - 3.6 K/uL   Monocytes Relative 3 %   Monocytes Absolute 0.4 0.2 - 0.9 K/uL   Eosinophils Relative 0 %   Eosinophils Absolute 0.0 0 - 0.7 K/uL   Basophils Relative 0 %   Basophils Absolute 0.0 0 - 0.1 K/uL    Comment: Performed at [facility], [address] [place], [place] [address]  Basic metabolic panel     Status: Abnormal   Collection Time: 06/26/18  7:33 AM  Result Value Ref Range   Sodium 135 135 - 145 mmol/L   Potassium 2.6 (LL) 3.5 - 5.1 mmol/L    Comment: CRITICAL RESULT CALLED TO, READ BACK BY AND VERIFIED WITH PAULETTE HAUGH AT 6808 06/26/18 DAS    Chloride 98 98 - 111 mmol/L   CO2 26 22 - 32 mmol/L   Glucose, Bld 167 (H) 70 - 99 mg/dL   BUN 16 8 - 23 mg/dL   Creatinine, Ser 1.57 (H) 0.44 - 1.00 mg/dL   Calcium 8.9 8.9 - 10.3 mg/dL   GFR calc non Af Amer 31 (L) >60 mL/min   GFR calc Af Amer 36 (L) >60 mL/min    Comment: (NOTE) The eGFR has been calculated using the CKD EPI  equation. This calculation has not been validated in all clinical situations. eGFR's persistently <60 mL/min signify possible Chronic Kidney Disease.    Anion gap 11 5 - 15    Comment: Performed at [facility], [address] [place], [place] [address]  Urinalysis, Complete w Microscopic     Status: Abnormal   Collection Time: 06/26/18  7:33 AM  Result Value Ref Range   Color, Urine AMBER (A) YELLOW    Comment: BIOCHEMICALS MAY BE AFFECTED BY COLOR   APPearance CLOUDY (A) CLEAR   Specific Gravity, Urine 1.016 1.005 - 1.030   pH 5.0  5.0 - 8.0   Glucose, UA NEGATIVE NEGATIVE mg/dL   Hgb urine dipstick MODERATE (A) NEGATIVE   Bilirubin Urine NEGATIVE NEGATIVE   Ketones, ur NEGATIVE NEGATIVE mg/dL   Protein, ur 100 (A) NEGATIVE mg/dL   Nitrite POSITIVE (A) NEGATIVE   Leukocytes, UA MODERATE (A) NEGATIVE   RBC / HPF >50 (H) 0 - 5 RBC/hpf   WBC, UA >50 (H) 0 - 5 WBC/hpf   Bacteria, UA NONE SEEN NONE SEEN   Squamous Epithelial / LPF 0-5 0 - 5   WBC Clumps PRESENT    Mucus PRESENT    Non Squamous Epithelial PRESENT (A) NONE SEEN    Comment: Performed at [facility], [address] [place], [place] [address]  Magnesium     Status: None   Collection Time: 06/26/18  7:33 AM  Result Value Ref Range   Magnesium 1.7 1.7 - 2.4 mg/dL    Comment: Performed at [facility], [address] [place], [place] [address]  Potassium     Status: None   Collection Time: 06/26/18  5:47 PM  Result Value Ref Range   Potassium 4.3 3.5 - 5.1 mmol/L    Comment: Performed at [facility], [address] [place], [place] [address]  Magnesium     Status: Abnormal   Collection Time: 06/27/18  4:15 AM  Result Value Ref Range   Magnesium 2.5 (H) 1.7 - 2.4 mg/dL    Comment: Performed at [facility], [address] [place], [place] [address]  Basic metabolic panel     Status: Abnormal   Collection Time: 06/27/18  4:15 AM  Result Value Ref Range    Sodium 135 135 - 145 mmol/L   Potassium 4.9 3.5 - 5.1 mmol/L   Chloride 105 98 - 111 mmol/L   CO2 24 22 - 32 mmol/L   Glucose, Bld 132 (H) 70 - 99 mg/dL   BUN 23 8 - 23 mg/dL   Creatinine, Ser 1.39 (H) 0.44 - 1.00 mg/dL   Calcium 8.3 (L) 8.9 - 10.3 mg/dL   GFR calc non Af Amer 36 (L) >60 mL/min   GFR calc Af Amer 42 (L) >60 mL/min    Comment: (NOTE) The eGFR has been calculated using the CKD EPI equation. This calculation has not been validated in all clinical situations. eGFR's persistently <60 mL/min signify possible Chronic Kidney Disease.    Anion gap 6 5 - 15    Comment: Performed at [facility], [address] [place], [place] [address]  Basic metabolic panel     Status: Abnormal   Collection Time: 06/28/18  3:56 AM  Result Value Ref Range   Sodium 135 135 - 145 mmol/L   Potassium 4.9 3.5 - 5.1 mmol/L   Chloride 105 98 - 111 mmol/L   CO2 24 22 - 32 mmol/L   Glucose, Bld 133 (H) 70 - 99 mg/dL   BUN 20 8 - 23 mg/dL   Creatinine, Ser 1.26 (H) 0.44 - 1.00 mg/dL   Calcium 8.4 (L) 8.9 - 10.3 mg/dL   GFR calc non Af Amer 41 (L) >60 mL/min   GFR calc Af Amer 47 (L) >60 mL/min    Comment: (NOTE) The eGFR has been calculated using the CKD EPI equation. This calculation has not been validated in all clinical situations. eGFR's persistently <60 mL/min signify possible Chronic Kidney Disease.    Anion gap 6 5 - 15    Comment: Performed at [facility]  [facility], [address], [place], [place] [address]  Urine Culture     Status: None   Collection Time: 06/28/18  1:42 PM  Result Value Ref Range   Specimen Description      URINE, CLEAN CATCH Performed at [facility], [address], [place], [place] [address]    Special Requests      NONE Performed at [facility], [address], [place], [place] [address]    Culture      NO GROWTH Performed at [facility], [address] [address], [place], [place] [address]    Report Status 06/30/2018  FINAL       PHQ2/9: Depression screen [facility] 2/9 07/02/2018 05/24/2018 05/17/2018 05/17/2018 04/05/2018  Decreased Interest 0 0 0 0 0  Down, Depressed, Hopeless 1 0 0 0 0  PHQ - 2 Score 1 0 0 0 0  Altered sleeping - - 0 2 2  Tired, decreased energy - - 0 1 2  Change in appetite - - 0 0 0  Feeling bad or failure about yourself  - - 0 0 0  Trouble concentrating - - 0 0 0  Moving slowly or fidgety/restless - - 0 0 0  Suicidal thoughts - - 0 0 0  PHQ-9 Score - - 0 3 4  Difficult doing work/chores - - Not difficult at all Somewhat difficult Somewhat difficult     Fall Risk: Fall Risk  07/02/2018 05/24/2018 05/17/2018 05/17/2018 04/05/2018  Falls in the past year? _0   Number falls in past yr: - - - - -  Injury with Fall? - - - - -  [place] for fall due to : - - Impaired vision;Other (Comment);Medication side effect - -  Risk for fall due to: Comment - - contacts; R cataract; fatigue, dyspnea; s/p cardioversion - -  Follow up - - - - -     Assessment & Plan  1. Hospital discharge follow-up   2. CKD (chronic kidney disease), stage III (HCC)  - BASIC METABOLIC PANEL WITH GFR  3. Need for immunization against influenza  - Flu vaccine HIGH DOSE PF (Fluzone High dose)  4. Essential hypertension  BP elevated today, but normal at home around 130's  5. Hematuria, unspecified type  Urine culture negative, if remains we will refer her to urologist   6. Hypokalemia  - BASIC METABOLIC PANEL WITH GFR - Magnesium   7. Leukocytosis, unspecified type  - CBC with Differential/Platelet

## 2018-07-04 NOTE — Patient Instructions (Signed)
Ask Dr. Larose Hires about Tizanidine for neck pain

## 2018-07-05 LAB — CBC WITH DIFFERENTIAL/PLATELET
BASOS ABS: 21 {cells}/uL (ref 0–200)
Basophils Relative: 0.3 %
EOS ABS: 43 {cells}/uL (ref 15–500)
EOS PCT: 0.6 %
HCT: 33.5 % — ABNORMAL LOW (ref 35.0–45.0)
Hemoglobin: 11.2 g/dL — ABNORMAL LOW (ref 11.7–15.5)
Lymphs Abs: 1243 cells/uL (ref 850–3900)
MCH: 31.1 pg (ref 27.0–33.0)
MCHC: 33.4 g/dL (ref 32.0–36.0)
MCV: 93.1 fL (ref 80.0–100.0)
MONOS PCT: 8.3 %
MPV: 8.7 fL (ref 7.5–12.5)
NEUTROS ABS: 5204 {cells}/uL (ref 1500–7800)
NEUTROS PCT: 73.3 %
Platelets: 446 10*3/uL — ABNORMAL HIGH (ref 140–400)
RBC: 3.6 10*6/uL — ABNORMAL LOW (ref 3.80–5.10)
RDW: 14.5 % (ref 11.0–15.0)
TOTAL LYMPHOCYTE: 17.5 %
WBC mixed population: 589 cells/uL (ref 200–950)
WBC: 7.1 10*3/uL (ref 3.8–10.8)

## 2018-07-05 LAB — BASIC METABOLIC PANEL WITH GFR
BUN/Creatinine Ratio: 12 (calc) (ref 6–22)
BUN: 15 mg/dL (ref 7–25)
CALCIUM: 9.1 mg/dL (ref 8.6–10.4)
CO2: 30 mmol/L (ref 20–32)
CREATININE: 1.29 mg/dL — AB (ref 0.60–0.93)
Chloride: 103 mmol/L (ref 98–110)
GFR, Est African American: 47 mL/min/{1.73_m2} — ABNORMAL LOW (ref >=60)
GFR, Est Non African American: 40 mL/min/{1.73_m2} — ABNORMAL LOW (ref >=60)
Glucose, Bld: 105 mg/dL (ref 65–139)
POTASSIUM: 3.6 mmol/L (ref 3.5–5.3)
Sodium: 141 mmol/L (ref 135–146)

## 2018-07-05 LAB — MAGNESIUM: Magnesium: 2 mg/dL (ref 1.5–2.5)

## 2018-07-22 NOTE — Progress Notes (Signed)
Cardiology Office Note  Date:  07/24/2018   ID:  [name], DOB [birth year], MRN [id number]  PCP:  Leslie Sizer, MD   Chief Complaint  Patient presents with  . other    F/u hospital hypokalemia c/o elevated BP, neck and back pain. Meds reviewed verbally with pt.    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 Lovena Le to have sinus tachycardia, lost to cardiology follow-up Carotid u/s  01/2018 with no significant blockages Ejection fraction 45 to 50%, mild to moderately elevated right heart pressures on echo May 2019 Several cardioversions for atrial fibrillation June and July 2019 Who presents for follow-up of her paroxysmal  atrial fibrillation  In the hospital September 2019 hypokalemia, cystitis with hematuria, acute kidney injury Hospital records reviewed with the patient in detail Lasix was held while she was in the hospital, 1 dose given prior to discharge IV Creatinine at discharge 1.26 BUN 20 potassium 4.9 On arrival potassium was 2.6 creatinine 1.57  Home BP 133-150 since discharge Still feels somewhat sluggish She has restarted Lasix 20 daily with potassium 10 and a banana daily Etiology of low potassium on arrival to the hospital still unclear Denies any vomiting or diarrhea Reports she had urinary tract infection symptoms for at least a week and put it off  Denies any tachycardia palpitations concerning for atrial fibrillation Edema has resolved Chronic neck pain  EKG personally reviewed by myself on todays visit Shows normal sinus rhythm rate 62 bpm left bundle branch block  Other past medical history reviewed  in the hospital July 2019 for acute on chronic combined CHF Persistent atrial fibrillation with RVR and respiratory distress with hypoxia Treated with Lasix Status post briefly successful DCCV on 6/4.  back  in A. fib approximately 6/7 to 6/8  DCCV  7/3, successful, bradycardia  Event monitor paroxysmal atrial fibrillation   mild carotid stenosis on the left Initially found on x-ray of neck.  vascular study done back in 2011 that showed mild plaque formation. Repeat ultrasound April 2019 with plaque noted,  less than 50%    PMH:   has a past medical history of Bronchitis, Chronic combined systolic (congestive) and diastolic (congestive) heart failure ([place]), DJD (degenerative joint disease), cervical, History of stress test, Hyperlipidemia, Hypertension, Hypothyroid, LBBB (left bundle branch block), Leg pain, Persistent atrial fibrillation, and SVT (supraventricular tachycardia) ([place]).  PSH:    Past Surgical History:  Procedure Laterality Date  . CARDIOVERSION N/A 03/26/2018   Procedure: CARDIOVERSION;  Surgeon: Nelva Bush, MD;  Location: [facility] ORS;  Service: Cardiovascular;  Laterality: N/A;  . CARDIOVERSION N/A 04/24/2018   Procedure: CARDIOVERSION;  Surgeon: Minna Merritts, MD;  Location: [facility] ORS;  Service: Cardiovascular;  Laterality: N/A;  . ELECTROPHYSIOLOGY STUDY N/A 02/27/2012   Procedure: ELECTROPHYSIOLOGY STUDY;  Surgeon: Evans Lance, MD;  Location: [facility] C[facility] LAB;  Service: Cardiovascular;  Laterality: N/A;  . EPS and ablation for SVT  5/13   slow pathway ablation by Dr Lovena Le  . EYE SURGERY     surgery for detatched retina  . SUPRAVENTRICULAR TACHYCARDIA ABLATION N/A 02/27/2012   Procedure: SUPRAVENTRICULAR TACHYCARDIA ABLATION;  Surgeon: Evans Lance, MD;  Location: [facility] CATH LAB;  Service: Cardiovascular;  Laterality: N/A;    Current Outpatient Medications  Medication Sig Dispense Refill  . acetaminophen (TYLENOL) 650 MG CR tablet Take 1,300 mg  by mouth every 8 (eight) hours as needed for pain.    Marland Kitchen amiodarone (PACERONE) 200 MG tablet Take 1 tablet (200 mg total) by mouth daily. 90 tablet 3  . apixaban (ELIQUIS) 5 MG TABS tablet Take 1 tablet (5 mg total) by mouth 2 (two)  times daily. 60 tablet 5  . atorvastatin (LIPITOR) 20 MG tablet TAKE 1 TABLET(20 MG) BY MOUTH DAILY AT 6 PM 90 tablet 1  . busPIRone (BUSPAR) 5 MG tablet Take 1 tablet (5 mg total) by mouth 2 (two) times daily. 180 tablet 1  . cholecalciferol (VITAMIN D) 1000 UNITS tablet Take 2,000 Units by mouth daily.    Marland Kitchen doxazosin (CARDURA) 1 MG tablet Take 1 tablet (1 mg total) by mouth 2 (two) times daily. 180 tablet 3  . ezetimibe (ZETIA) 10 MG tablet Take 1 tablet (10 mg total) by mouth daily. 30 tablet 11  . furosemide (LASIX) 20 MG tablet Take 1 tablet (20 mg total) by mouth daily. 90 tablet 1  . levothyroxine (SYNTHROID, LEVOTHROID) 137 MCG tablet TAKE 1 TABLET(137 MCG) BY MOUTH EVERY MORNING 90 tablet 1  . metoprolol succinate (TOPROL-XL) 50 MG 24 hr tablet Take 1 tablet (50 mg total) by mouth 2 (two) times daily.    . Multiple Vitamin (MULITIVITAMIN WITH MINERALS) TABS Take 1 tablet by mouth daily.    . temazepam (RESTORIL) 15 MG capsule Take 1 capsule (15 mg total) by mouth at bedtime as needed for sleep. 30 capsule 2  . valsartan (DIOVAN) 320 MG tablet Take 1 tablet (320 mg total) by mouth daily. 90 tablet 1  . potassium chloride (K-DUR) 10 MEQ tablet Take 1 tablet (10 mEq total) by mouth daily. 90 tablet 1   No current facility-administered medications for this visit.      Allergies:   Codeine; Erythromycin; Septra [sulfamethoxazole-trimethoprim]; and Penicillins   Social History:  The patient  reports that she has never smoked. She has never used smokeless tobacco. She reports that she does not drink alcohol or use drugs.   Family History:   family history includes Alzheimer's disease in her maternal grandfather and mother; Breast cancer in her father and paternal aunt; Heart disease in her father; Kidney cancer in her maternal grandmother; Stroke in her father and paternal grandfather; Thyroid disease in her paternal grandmother.    Review of Systems: Review of Systems  Constitutional:  Negative.   Respiratory: Negative.   Cardiovascular: Negative.   Gastrointestinal: Negative.   Musculoskeletal: Negative.   Neurological: Negative.   Psychiatric/Behavioral: Negative.   All other systems reviewed and are negative.    PHYSICAL EXAM: VS:  BP (!) 174/72 (BP Location: Left Arm, Patient Position: Sitting, Cuff Size: Normal)   Pulse 62   Ht 5\' 6"  (1.676 m)   Wt 137 lb (62.1 kg)   BMI 22.11 kg/m  , BMI Body mass index is 22.11 kg/m.  No significant change compared to prior office visit Constitutional:  oriented to person, place, and time. No distress.  HENT:  Head: Normocephalic and atraumatic.  Eyes:  no discharge. No scleral icterus.  Neck: Normal range of motion. Neck supple. No JVD present. bruit on the left Cardiovascular RRR,  normal heart sounds and intact distal pulses. Exam reveals no gallop and no friction rub. No edema No murmur heard. Pulmonary/Chest: Effort normal and breath sounds normal. No stridor. No respiratory distress.  no wheezes.  no rales.  no tenderness.  Abdominal: Soft.  no distension.  no tenderness.  Musculoskeletal:  Normal range of motion.  no  tenderness or deformity.  Neurological:  normal muscle tone. Coordination normal. No atrophy Skin: Skin is warm and dry. No rash noted. not diaphoretic.  Psychiatric:  normal mood and affect. behavior is normal. Thought content normal.    Recent Labs: 04/05/2018: TSH 4.13 04/22/2018: ALT 29; B Natriuretic Peptide 614.0 07/04/2018: BUN 15; Creat 1.29; Hemoglobin 11.2; Magnesium 2.0; Platelets 446; Potassium 3.6; Sodium 141    Lipid Panel Lab Results  Component Value Date   CHOL 173 09/04/2017   HDL 60 09/04/2017   LDLCALC 87 09/04/2017   TRIG 159 (H) 09/04/2017      Wt Readings from Last 3 Encounters:  07/24/18 137 lb (62.1 kg)  07/04/18 142 lb 12.8 oz (64.8 kg)  07/02/18 144 lb 6 oz (65.5 kg)       ASSESSMENT AND PLAN:  Atrial fibrillation, new onset (HCC) -  Continue current  regiment of metoprolol twice a day with low-dose amiodarone and anticoagulation Thankfully maintaining normal sinus rhythm  PAD (peripheral artery disease) ([place]) - Plan: EKG 12-Lead Bruit on the left without significant stenosis Recommended LDL less than 70 Statin with Zetia. No changes made Followed by Dr. Ronalee Belts  SVT (supraventricular tachycardia) ([place]) - Plan: EKG 12-Lead On amiodarone and metoprolol No changes to her medications  Mixed hyperlipidemia Lipitor 20, Zetia 10 mg daily Ideally goal LDL less than 70  Essential hypertension Recommend we increase Cardura up to 1 mg twice a day Monitor blood pressure at home and call us with measurements  Disposition:   F/U  6 month   Total encounter time more than 25 minutes  Greater than 50% was spent in counseling and coordination of care with the patient    Orders Placed This Encounter  Procedures  . EKG 12-Lead     Signed, Esmond Plants, M.D., Ph.D. 07/24/2018  [place], [place]

## 2018-07-24 ENCOUNTER — Encounter: Payer: Self-pay | Admitting: Cardiovascular Disease

## 2018-07-24 ENCOUNTER — Ambulatory Visit: Payer: Medicare Other | Admitting: Cardiovascular Disease

## 2018-07-24 VITALS — BP 174/72 | HR 62 | Ht 66.0 in | Wt 137.0 lb

## 2018-07-24 DIAGNOSIS — I4891 Unspecified atrial fibrillation: Secondary | ICD-10-CM

## 2018-07-24 DIAGNOSIS — R0602 Shortness of breath: Secondary | ICD-10-CM

## 2018-07-24 DIAGNOSIS — I340 Nonrheumatic mitral (valve) insufficiency: Secondary | ICD-10-CM

## 2018-07-24 DIAGNOSIS — E782 Mixed hyperlipidemia: Secondary | ICD-10-CM

## 2018-07-24 DIAGNOSIS — I739 Peripheral vascular disease, unspecified: Secondary | ICD-10-CM | POA: Diagnosis not present

## 2018-07-24 DIAGNOSIS — I5022 Chronic systolic (congestive) heart failure: Secondary | ICD-10-CM | POA: Diagnosis not present

## 2018-07-24 DIAGNOSIS — I272 Pulmonary hypertension, unspecified: Secondary | ICD-10-CM

## 2018-07-24 DIAGNOSIS — I10 Essential (primary) hypertension: Secondary | ICD-10-CM

## 2018-07-24 DIAGNOSIS — I471 Supraventricular tachycardia: Secondary | ICD-10-CM | POA: Diagnosis not present

## 2018-07-24 MED ORDER — AMIODARONE HCL 200 MG PO TABS: 200.0000 mg | ORAL_TABLET | Freq: Every day | ORAL | 3 refills | Status: DC

## 2018-07-24 MED ORDER — DOXAZOSIN MESYLATE 1 MG PO TABS: 1.0000 mg | ORAL_TABLET | Freq: Two times a day (BID) | ORAL | 3 refills | Status: DC

## 2018-07-24 NOTE — Patient Instructions (Addendum)
Medication Instructions:   Please increase the cardura up to 0.1 mg twice a day  Labwork:  No new labs needed  Testing/Procedures:  No further testing at this time   Follow-Up: It was a pleasure seeing you in the office today. Please call us if you have new issues that need to be addressed before your next appt.  [phone]  Your physician wants you to follow-up in: 6 months.  You will receive a reminder letter in the mail two months in advance. If you don't receive a letter, please call our office to schedule the follow-up appointment.  If you need a refill on your cardiac medications before your next appointment, please call your pharmacy.  For educational health videos Log in to : [email or web address] Or : [email or web address], password : [email or web address]

## 2018-07-29 ENCOUNTER — Inpatient Hospital Stay: Payer: Medicare Other | Admitting: Family Medicine

## 2018-08-19 ENCOUNTER — Other Ambulatory Visit: Payer: Self-pay | Admitting: Family Medicine

## 2018-08-23 ENCOUNTER — Other Ambulatory Visit: Payer: Self-pay

## 2018-08-23 NOTE — Patient Outreach (Signed)
[facility]) Care Management  08/23/2018  RENI HAUSNER 1940/11/11 [id number]   Medication Adherence call to Mrs. Alger Memos spoke with patient she still has medication on Atorvastatin 20 mg she said her boyfriend takes the same medication and his was discontinue and was taking his because she did not want to waste. Mrs. Dusing is showing past due under [place].   [facility] Management Direct Dial [phone]  Fax [phone] [email or web address]

## 2018-08-28 ENCOUNTER — Telehealth: Payer: Self-pay | Admitting: Family Medicine

## 2018-08-28 NOTE — Telephone Encounter (Signed)
Copied from [place] [id number]. Topic: Quick Communication - See Telephone Encounter >> Aug 28, 2018  2:36 PM R[place]ford Nail, [place] wrote: CRM for notification. See Telephone encounter for: 08/28/18. Patient calling and states that she received a phone call advising her that she would need to come in and fill out her portion of some paperwork and sign it regarding her apixaban (ELIQUIS) 5 MG TABS tablet. Patient states that she does not remember who she spoke with. Please advise.  CB#: [phone]

## 2018-08-28 NOTE — Telephone Encounter (Signed)
I am not sure of who this patient spoke to but I do know that Lenna Sciara was speaking with the call center regarding the paperwork.

## 2018-08-29 NOTE — Telephone Encounter (Signed)
Spoke with patient about her [place] patient assistance foundation paperwork is upfront ready for her to fill out.

## 2018-09-12 DIAGNOSIS — E039 Hypothyroidism, unspecified: Secondary | ICD-10-CM | POA: Diagnosis not present

## 2018-09-12 DIAGNOSIS — I10 Essential (primary) hypertension: Secondary | ICD-10-CM | POA: Diagnosis not present

## 2018-09-12 DIAGNOSIS — E782 Mixed hyperlipidemia: Secondary | ICD-10-CM | POA: Diagnosis not present

## 2018-09-12 LAB — LIPID PANEL
Cholesterol: 127 mg/dL (ref <200)
HDL: 50 mg/dL — AB (ref >50)
LDL CHOLESTEROL (CALC): 60 mg/dL
Non-HDL Cholesterol (Calc): 77 mg/dL (calc) (ref <130)
TRIGLYCERIDES: 90 mg/dL (ref <150)
Total CHOL/HDL Ratio: 2.5 (calc) (ref <5.0)

## 2018-09-12 LAB — COMPLETE METABOLIC PANEL WITHOUT GFR
AG Ratio: 1.8 (calc) (ref 1.0–2.5)
ALT: 14 U/L (ref 6–29)
AST: 22 U/L (ref 10–35)
Albumin: 4.4 g/dL (ref 3.6–5.1)
Alkaline phosphatase (APISO): 56 U/L (ref 33–130)
BUN/Creatinine Ratio: 14 (calc) (ref 6–22)
BUN: 14 mg/dL (ref 7–25)
CO2: 28 mmol/L (ref 20–32)
Calcium: 9.5 mg/dL (ref 8.6–10.4)
Chloride: 103 mmol/L (ref 98–110)
Creat: 0.98 mg/dL — ABNORMAL HIGH (ref 0.60–0.93)
GFR, Est African American: 65 mL/min/1.73m2 (ref >=60)
GFR, Est Non African American: 56 mL/min/1.73m2 — ABNORMAL LOW (ref >=60)
Globulin: 2.5 g/dL (ref 1.9–3.7)
Glucose, Bld: 111 mg/dL — ABNORMAL HIGH (ref 65–99)
Potassium: 4.3 mmol/L (ref 3.5–5.3)
Sodium: 140 mmol/L (ref 135–146)
Total Bilirubin: 0.7 mg/dL (ref 0.2–1.2)
Total Protein: 6.9 g/dL (ref 6.1–8.1)

## 2018-09-12 LAB — CBC WITH DIFFERENTIAL/PLATELET
Basophils Absolute: 20 {cells}/uL (ref 0–200)
Basophils Relative: 0.3 %
Eosinophils Absolute: 59 {cells}/uL (ref 15–500)
Eosinophils Relative: 0.9 %
HCT: 36 % (ref 35.0–45.0)
Hemoglobin: 12.1 g/dL (ref 11.7–15.5)
Lymphs Abs: 1261 {cells}/uL (ref 850–3900)
MCH: 31.8 pg (ref 27.0–33.0)
MCHC: 33.6 g/dL (ref 32.0–36.0)
MCV: 94.5 fL (ref 80.0–100.0)
MPV: 9.7 fL (ref 7.5–12.5)
Monocytes Relative: 6 %
Neutro Abs: 4771 {cells}/uL (ref 1500–7800)
Neutrophils Relative %: 73.4 %
Platelets: 215 Thousand/uL (ref 140–400)
RBC: 3.81 Million/uL (ref 3.80–5.10)
RDW: 13.1 % (ref 11.0–15.0)
Total Lymphocyte: 19.4 %
WBC mixed population: 390 {cells}/uL (ref 200–950)
WBC: 6.5 Thousand/uL (ref 3.8–10.8)

## 2018-09-12 LAB — TSH: TSH: 1.19 m[IU]/L (ref 0.40–4.50)

## 2018-09-17 ENCOUNTER — Encounter: Payer: Self-pay | Admitting: Family Medicine

## 2018-09-17 ENCOUNTER — Ambulatory Visit (INDEPENDENT_AMBULATORY_CARE_PROVIDER_SITE_OTHER): Payer: Medicare Other | Admitting: Family Medicine

## 2018-09-17 VITALS — BP 160/70 | HR 65 | Temp 97.2°F | Resp 16 | Ht 66.0 in | Wt 133.9 lb

## 2018-09-17 DIAGNOSIS — I5042 Chronic combined systolic (congestive) and diastolic (congestive) heart failure: Secondary | ICD-10-CM

## 2018-09-17 DIAGNOSIS — I4891 Unspecified atrial fibrillation: Secondary | ICD-10-CM | POA: Diagnosis not present

## 2018-09-17 DIAGNOSIS — F411 Generalized anxiety disorder: Secondary | ICD-10-CM

## 2018-09-17 DIAGNOSIS — N183 Chronic kidney disease, stage 3 unspecified: Secondary | ICD-10-CM

## 2018-09-17 DIAGNOSIS — I10 Essential (primary) hypertension: Secondary | ICD-10-CM | POA: Diagnosis not present

## 2018-09-17 DIAGNOSIS — M542 Cervicalgia: Secondary | ICD-10-CM

## 2018-09-17 DIAGNOSIS — E785 Hyperlipidemia, unspecified: Secondary | ICD-10-CM

## 2018-09-17 DIAGNOSIS — I272 Pulmonary hypertension, unspecified: Secondary | ICD-10-CM

## 2018-09-17 DIAGNOSIS — I739 Peripheral vascular disease, unspecified: Secondary | ICD-10-CM

## 2018-09-17 DIAGNOSIS — E039 Hypothyroidism, unspecified: Secondary | ICD-10-CM

## 2018-09-17 DIAGNOSIS — D692 Other nonthrombocytopenic purpura: Secondary | ICD-10-CM

## 2018-09-17 DIAGNOSIS — I779 Disorder of arteries and arterioles, unspecified: Secondary | ICD-10-CM

## 2018-09-17 DIAGNOSIS — G8929 Other chronic pain: Secondary | ICD-10-CM

## 2018-09-17 MED ORDER — VALSARTAN 320 MG PO TABS: 320.0000 mg | ORAL_TABLET | Freq: Every day | ORAL | 1 refills | Status: DC

## 2018-09-17 MED ORDER — LEVOTHYROXINE SODIUM 137 MCG PO TABS: ORAL_TABLET | ORAL | 1 refills | Status: DC

## 2018-09-17 MED ORDER — BUSPIRONE HCL 5 MG PO TABS: 5.0000 mg | ORAL_TABLET | Freq: Two times a day (BID) | ORAL | 0 refills | Status: DC

## 2018-09-17 MED ORDER — ATORVASTATIN CALCIUM 20 MG PO TABS: ORAL_TABLET | ORAL | 1 refills | Status: DC

## 2018-09-17 NOTE — Progress Notes (Signed)
Name: [name]   MRN: [id number]    DOB: 06-30-1941   Date:09/17/2018       Progress Note  Subjective  Chief Complaint  Chief Complaint  Patient presents with  . Atrial Fibrillation  . Hypertension    HPI  Carotid calcification: found on x-ray of neck. She also had a vascular study done back in 2011 that showed mild plaque formationon right.She is now on statin, Zetia and Eliquis. Off aspirin since ablation 04/2018. She denies headache or dizziness, she states she has balance problems - but states secondary to visual disturbance ( she can see better from left eye than right). She went back to Dr. Erven Colla and had ABI that was normal, carotid doppler unchanged and will go back in 2 years. Continue current management   Cervical radiculitis: she is now going to [facility] under the care of Dr. Larose Hires , also had injections done by Dr. Orpah Melter and having pain again, she will contact them for more injections. She is currently only having neck pain that radiates to trapezium muscles,  not to hands and no weakness.  HTN: bp at home has been in the 150's/60's, recently seen by Dr. Rockey Situ and Cardura was added.  Possible white coat component. SBP is high today , she denies side effects of medications, except for occasional dizziness when bp drops to 114's   Afib : she had a cardioversion in June and another one in July , she has intermittent palpitation but no chest pain, rate controlled with amiodarone and metoprolol.  Compliant with medications, no blood in stools or easy bruising.    CHF chronic: she is down to one pillow at night. She has mild sob with moderate activity, no chest pain, denies lower extremity edema. She states walking up hills or stairs causes SOB, can walk flat at [facility] for 30 minutes.   CKI: on three labs, but level has improved, good urine output , no pruritis. On ARB  GAD: doing well on Buspar twice daily, she also takes temazepam qhs prn and it helps her  sleep, no side effects of medications. Unchanged  Hypothyroidism: doing well on medication, denies hair loss, denies constipation. Last TSH at goal  Patient Active Problem List   Diagnosis Date Noted  . Chronic heart failure ([place]) 05/24/2018  . Atrial fibrillation ([place]) 05/24/2018  . CKD (chronic kidney disease), stage III ([place]) 05/17/2018  . 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 Shortness of breath 05/07/2018  . Pulmonary hypertension, unspecified ([place]) 04/05/2018  . Chronic combined systolic (congestive) and diastolic (congestive) heart failure ([place]) 04/05/2018  . Persistent atrial fibrillation 03/25/2018  . Paroxysmal sinus tachycardia ([place]) 01/29/2018  . Leg pain 01/04/2018  . PAD (peripheral artery disease) ([place]) 01/04/2018  . Carotid artery calcification, bilateral 12/19/2017  . Cervical radiculitis 12/19/2017  . Atrial fibrillation, new onset ([place]) 12/19/2017  . Hyperglycemia 03/06/2016  . Hypothyroid 04/21/2015  . 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 Anxiety 04/21/2015  . Hyperlipidemia 04/21/2015  . Diuretic-induced hypokalemia 04/21/2015  . Palpitations 12/18/2012  . Hypertension 05/27/2012    Past Surgical History:  Procedure Laterality Date  . CARDIOVERSION N/A 03/26/2018   Procedure: CARDIOVERSION;  Surgeon: Nelva Bush, MD;  Location: [facility] ORS;  Service: Cardiovascular;  Laterality: N/A;  . CARDIOVERSION N/A 04/24/2018   Procedure: CARDIOVERSION;  Surgeon: Minna Merritts, MD;  Location: [facility] ORS;  Service: Cardiovascular;  Laterality: N/A;  . ELECTROPHYSIOLOGY STUDY N/A 02/27/2012   Procedure: ELECTROPHYSIOLOGY STUDY;  Surgeon: Evans Lance, MD;  Location: [facility] CATH LAB;  Service: Cardiovascular;  Laterality: N/A;  . EPS and ablation for SVT  5/13   slow pathway ablation by Dr Lovena Le  . EYE SURGERY     surgery for detatched retina  . SUPRAVENTRICULAR TACHYCARDIA ABLATION N/A 02/27/2012   Procedure: SUPRAVENTRICULAR TACHYCARDIA  ABLATION;  Surgeon: Evans Lance, MD;  Location: [facility] CATH LAB;  Service: Cardiovascular;  Laterality: N/A;    Family History  Problem Relation Age of Onset  . Alzheimer's disease Mother   . Stroke Father   . Breast cancer Father   . Heart disease Father        Pacemaker  . Breast cancer Paternal Aunt   . Kidney cancer Maternal Grandmother   . Alzheimer's disease Maternal Grandfather   . Thyroid disease Paternal Grandmother   . Stroke Paternal Grandfather     Social History   Socioeconomic History  . Marital status: Widowed    Spouse name: Not on file  . Number of children: 1  . Years of education: Not on file  . Highest education level: 12th grade  Occupational History  . Occupation: Retired  Scientific laboratory technician  . Financial resource strain: Not hard at all  . Food insecurity:    Worry: Never true    Inability: Never true  . Transportation needs:    Medical: No    Non-medical: No  Tobacco Use  . Smoking status: Never Smoker  . Smokeless tobacco: Never Used  . Tobacco comment: smoking cessation materials not required  Substance and Sexual Activity  . Alcohol use: No  . Drug use: No  . Sexual activity: Not Currently    Partners: Male  Lifestyle  . Physical activity:    Days per week: 6 days    Minutes per session: 30 min  . Stress: Not at all  Relationships  . Social connections:    Talks on phone: More than three times a week    Gets together: More than three times a week    Attends religious service: More than 4 times per year    Active member of club or organization: Yes    Attends meetings of clubs or organizations: More than 4 times per year    Relationship status: Widowed  . Intimate partner violence:    Fear of current or ex partner: No    Emotionally abused: No    Physically abused: No    Forced sexual activity: No  Other Topics Concern  . Not on file  Social History Narrative   Lives in [place], she has a boyfriend     Current Outpatient  Medications:  .  acetaminophen (TYLENOL) 650 MG CR tablet, Take 1,300 mg by mouth every 8 (eight) hours as needed for pain., Disp: , Rfl:  .  amiodarone (PACERONE) 200 MG tablet, Take 1 tablet (200 mg total) by mouth daily., Disp: 90 tablet, Rfl: 3 .  apixaban (ELIQUIS) 5 MG TABS tablet, Take 1 tablet (5 mg total) by mouth 2 (two) times daily., Disp: 60 tablet, Rfl: 5 .  atorvastatin (LIPITOR) 20 MG tablet, Take one daily, Disp: 90 tablet, Rfl: 1 .  busPIRone (BUSPAR) 5 MG tablet, Take 1 tablet (5 mg total) by mouth 2 (two) times daily., Disp: 180 tablet, Rfl: 0 .  cholecalciferol (VITAMIN D) 1000 UNITS tablet, Take 2,000 Units by mouth daily., Disp: , Rfl:  .  doxazosin (CARDURA) 1 MG tablet, Take 1 tablet (1 mg total) by  mouth 2 (two) times daily., Disp: 180 tablet, Rfl: 3 .  ezetimibe (ZETIA) 10 MG tablet, Take 1 tablet (10 mg total) by mouth daily., Disp: 30 tablet, Rfl: 11 .  furosemide (LASIX) 20 MG tablet, Take 1 tablet (20 mg total) by mouth daily., Disp: 90 tablet, Rfl: 1 .  levothyroxine (SYNTHROID, LEVOTHROID) 137 MCG tablet, TAKE 1 TABLET(137 MCG) BY MOUTH EVERY MORNING, Disp: 90 tablet, Rfl: 1 .  metoprolol succinate (TOPROL-XL) 50 MG 24 hr tablet, Take 1 tablet (50 mg total) by mouth 2 (two) times daily., Disp: , Rfl:  .  Multiple Vitamin (MULITIVITAMIN WITH MINERALS) TABS, Take 1 tablet by mouth daily., Disp: , Rfl:  .  temazepam (RESTORIL) 15 MG capsule, Take 1 capsule (15 mg total) by mouth at bedtime as needed for sleep., Disp: 30 capsule, Rfl: 2 .  potassium chloride (K-DUR) 10 MEQ tablet, Take 1 tablet (10 mEq total) by mouth daily., Disp: 90 tablet, Rfl: 1 .  valsartan (DIOVAN) 320 MG tablet, Take 1 tablet (320 mg total) by mouth daily., Disp: 90 tablet, Rfl: 1  Allergies  Allergen Reactions  . Codeine Nausea And Vomiting  . Erythromycin Itching and Swelling  . Septra [Sulfamethoxazole-Trimethoprim] Swelling  . Penicillins Swelling and Rash    Has patient had a PCN  reaction causing immediate rash, facial/tongue/throat swelling, SOB or lightheadedness with hypotension: Yes Has patient had a PCN reaction causing severe rash involving mucus membranes or skin necrosis: No Has patient had a PCN reaction that required hospitalization: No Has patient had a PCN reaction occurring within the last 10 years: No If all of the above answers are "NO", then may proceed with Cephalosporin use.     I personally reviewed active problem list, medication list, allergies, family history, social history with the patient/caregiver today.   ROS  Constitutional: Negative for fever or weight change.  Respiratory: Negative for cough and shortness of breath.   Cardiovascular: Negative for chest pain or palpitations.  Gastrointestinal: Negative for abdominal pain, no bowel changes.  Musculoskeletal: Negative for gait problem or joint swelling.  Skin: Negative for rash.  Neurological: Negative for dizziness or headache.  No other specific complaints in a complete review of systems (except as listed in HPI above).  Objective  Vitals:   09/17/18 0839 09/17/18 0844  BP: (!) 160/80 (!) 160/70  Pulse: 65   Resp: 16   Temp: (!) 97.2 F (36.2 C)   TempSrc: Oral   SpO2: 100%   Weight: 133 lb 14.4 oz (60.7 kg)   Height: 5' 6"  (1.676 m)     Body mass index is 21.61 kg/m.  Physical Exam  Constitutional: Patient appears well-developed and well-nourished. No distress.  HEENT: head atraumatic, normocephalic, pupils equal and reactive to light, neck supple, throat within normal limits Cardiovascular: Normal rate, regular rhythm and normal heart sounds.  Systolic ejection  murmur heard. No BLE edema. Skin: senile purpura on both arms Pulmonary/Chest: Effort normal and breath sounds normal. No respiratory distress. Abdominal: Soft.  There is no tenderness. Psychiatric: Patient has a normal mood and affect. behavior is normal. Judgment and thought content normal.  Recent  Results (from the past 2160 hour(s))  CBC with Differential/Platelet     Status: Abnormal   Collection Time: 06/26/18  7:33 AM  Result Value Ref Range   WBC 13.1 (H) 3.6 - 11.0 K/uL   RBC 3.75 (L) 3.80 - 5.20 MIL/uL   Hemoglobin 12.3 12.0 - 16.0 g/dL   HCT 34.4 (L) 35.0 -  47.0 %   MCV 91.5 80.0 - 100.0 fL   MCH 32.6 26.0 - 34.0 pg   MCHC 35.7 32.0 - 36.0 g/dL   RDW 17.2 (H) 11.5 - 14.5 %   Platelets 178 150 - 440 K/uL   Neutrophils Relative % 95 %   Neutro Abs 12.3 (H) 1.4 - 6.5 K/uL   Lymphocytes Relative 2 %   Lymphs Abs 0.3 (L) 1.0 - 3.6 K/uL   Monocytes Relative 3 %   Monocytes Absolute 0.4 0.2 - 0.9 K/uL   Eosinophils Relative 0 %   Eosinophils Absolute 0.0 0 - 0.7 K/uL   Basophils Relative 0 %   Basophils Absolute 0.0 0 - 0.1 K/uL    Comment: Performed at [facility], [address] [place], [place] [address]  Basic metabolic panel     Status: Abnormal   Collection Time: 06/26/18  7:33 AM  Result Value Ref Range   Sodium 135 135 - 145 mmol/L   Potassium 2.6 (LL) 3.5 - 5.1 mmol/L    Comment: CRITICAL RESULT CALLED TO, READ BACK BY AND VERIFIED WITH PAULETTE HAUGH AT 0160 06/26/18 DAS    Chloride 98 98 - 111 mmol/L   CO2 26 22 - 32 mmol/L   Glucose, Bld 167 (H) 70 - 99 mg/dL   BUN 16 8 - 23 mg/dL   Creatinine, Ser 1.57 (H) 0.44 - 1.00 mg/dL   Calcium 8.9 8.9 - 10.3 mg/dL   GFR calc non Af Amer 31 (L) >60 mL/min   GFR calc Af Amer 36 (L) >60 mL/min    Comment: (NOTE) The eGFR has been calculated using the CKD EPI equation. This calculation has not been validated in all clinical situations. eGFR's persistently <60 mL/min signify possible Chronic Kidney Disease.    Anion gap 11 5 - 15    Comment: Performed at [facility], [address] [place], [place] [address]  Urinalysis, Complete w Microscopic     Status: Abnormal   Collection Time: 06/26/18  7:33 AM  Result Value Ref Range   Color, Urine AMBER (A) YELLOW    Comment: BIOCHEMICALS MAY BE  AFFECTED BY COLOR   APPearance CLOUDY (A) CLEAR   Specific Gravity, Urine 1.016 1.005 - 1.030   pH 5.0 5.0 - 8.0   Glucose, UA NEGATIVE NEGATIVE mg/dL   Hgb urine dipstick MODERATE (A) NEGATIVE   Bilirubin Urine NEGATIVE NEGATIVE   Ketones, ur NEGATIVE NEGATIVE mg/dL   Protein, ur 100 (A) NEGATIVE mg/dL   Nitrite POSITIVE (A) NEGATIVE   Leukocytes, UA MODERATE (A) NEGATIVE   RBC / HPF >50 (H) 0 - 5 RBC/hpf   WBC, UA >50 (H) 0 - 5 WBC/hpf   Bacteria, UA NONE SEEN NONE SEEN   Squamous Epithelial / LPF 0-5 0 - 5   WBC Clumps PRESENT    Mucus PRESENT    Non Squamous Epithelial PRESENT (A) NONE SEEN    Comment: Performed at [facility], [address] [place], [place] [address]  Magnesium     Status: None   Collection Time: 06/26/18  7:33 AM  Result Value Ref Range   Magnesium 1.7 1.7 - 2.4 mg/dL    Comment: Performed at [facility], [address] [place], [place] [address]  Potassium     Status: None   Collection Time: 06/26/18  5:47 PM  Result Value Ref Range   Potassium 4.3 3.5 - 5.1 mmol/L    Comment: Performed at [facility], [address] [place], [place] [address]  Magnesium     Status: Abnormal   Collection Time: 06/27/18  4:15 AM  Result Value Ref Range   Magnesium 2.5 (H) 1.7 - 2.4 mg/dL    Comment: Performed at [facility], [address] [place], [place] [address]  Basic metabolic panel     Status: Abnormal   Collection Time: 06/27/18  4:15 AM  Result Value Ref Range   Sodium 135 135 - 145 mmol/L   Potassium 4.9 3.5 - 5.1 mmol/L   Chloride 105 98 - 111 mmol/L   CO2 24 22 - 32 mmol/L   Glucose, Bld 132 (H) 70 - 99 mg/dL   BUN 23 8 - 23 mg/dL   Creatinine, Ser 1.39 (H) 0.44 - 1.00 mg/dL   Calcium 8.3 (L) 8.9 - 10.3 mg/dL   GFR calc non Af Amer 36 (L) >60 mL/min   GFR calc Af Amer 42 (L) >60 mL/min    Comment: (NOTE) The eGFR has been calculated using the CKD EPI equation. This calculation has not been validated in  all clinical situations. eGFR's persistently <60 mL/min signify possible Chronic Kidney Disease.    Anion gap 6 5 - 15    Comment: Performed at [facility], [address] [place], [place] [address]  Basic metabolic panel     Status: Abnormal   Collection Time: 06/28/18  3:56 AM  Result Value Ref Range   Sodium 135 135 - 145 mmol/L   Potassium 4.9 3.5 - 5.1 mmol/L   Chloride 105 98 - 111 mmol/L   CO2 24 22 - 32 mmol/L   Glucose, Bld 133 (H) 70 - 99 mg/dL   BUN 20 8 - 23 mg/dL   Creatinine, Ser 1.26 (H) 0.44 - 1.00 mg/dL   Calcium 8.4 (L) 8.9 - 10.3 mg/dL   GFR calc non Af Amer 41 (L) >60 mL/min   GFR calc Af Amer 47 (L) >60 mL/min    Comment: (NOTE) The eGFR has been calculated using the CKD EPI equation. This calculation has not been validated in all clinical situations. eGFR's persistently <60 mL/min signify possible Chronic Kidney Disease.    Anion gap 6 5 - 15    Comment: Performed at [facility], [address], [place], [place] [address]  Urine Culture     Status: None   Collection Time: 06/28/18  1:42 PM  Result Value Ref Range   Specimen Description      URINE, CLEAN CATCH Performed at [facility], [address] [place], [place] [address]    Special Requests      NONE Performed at [facility], [address] [place], [place] [address]    Culture      NO GROWTH Performed at [facility], [place] [address] [place], [place] [address]    Report Status 06/30/2018 FINAL   BASIC METABOLIC PANEL WITH GFR     Status: Abnormal   Collection Time: 07/04/18 12:46 PM  Result Value Ref Range   Glucose, Bld 105 65 - 139 mg/dL    Comment: .        Non-fasting reference interval .    BUN 15 7 - 25 mg/dL   Creat 1.29 (H) 0.60 - 0.93 mg/dL    Comment: For patients >29 years of age, the reference limit for Creatinine is approximately 13% higher for people identified as African-American. .    GFR, Est Non African American  40 (L) > OR = 60 mL/min/1.31m   GFR, Est African American 47 (L) > OR = 60 mL/min/1.785m  BUN/Creatinine Ratio 12 6 - 22 (calc)   Sodium 141 135 - 146 mmol/L   Potassium 3.6 3.5 - 5.3 mmol/L   Chloride 103 98 - 110 mmol/L   CO2 30 20 - 32 mmol/L   Calcium 9.1 8.6 - 10.4 mg/dL  Magnesium     Status: None   Collection Time: 07/04/18 12:46 PM  Result Value Ref Range   Magnesium 2.0 1.5 - 2.5 mg/dL  CBC with Differential/Platelet     Status: Abnormal   Collection Time: 07/04/18 12:46 PM  Result Value Ref Range   WBC 7.1 3.8 - 10.8 Thousand/uL   RBC 3.60 (L) 3.80 - 5.10 Million/uL   Hemoglobin 11.2 (L) 11.7 - 15.5 g/dL   HCT 33.5 (L) 35.0 - 45.0 %   MCV 93.1 80.0 - 100.0 fL   MCH 31.1 27.0 - 33.0 pg   MCHC 33.4 32.0 - 36.0 g/dL   RDW 14.5 11.0 - 15.0 %   Platelets 446 (H) 140 - 400 Thousand/uL   MPV 8.7 7.5 - 12.5 fL   Neutro Abs 5,204 1,500 - 7,800 cells/uL   Lymphs Abs 1,243 850 - 3,900 cells/uL   WBC mixed population 589 200 - 950 cells/uL   Eosinophils Absolute 43 15 - 500 cells/uL   Basophils Absolute 21 0 - 200 cells/uL   Neutrophils Relative % 73.3 %   Total Lymphocyte 17.5 %   Monocytes Relative 8.3 %   Eosinophils Relative 0.6 %   Basophils Relative 0.3 %   Smear Review      Comment: Review of peripheral smear confirms automated results.   COMPLETE METABOLIC PANEL WITH GFR     Status: Abnormal   Collection Time: 09/12/18  9:03 AM  Result Value Ref Range   Glucose, Bld 111 (H) 65 - 99 mg/dL    Comment: .            Fasting reference interval . For someone without known diabetes, a glucose value between 100 and 125 mg/dL is consistent with prediabetes and should be confirmed with a follow-up test. .    BUN 14 7 - 25 mg/dL   Creat 0.98 (H) 0.60 - 0.93 mg/dL    Comment: For patients >4973ears of age, the reference limit for Creatinine is approximately 13% higher for people identified as African-American. .    GFR, Est Non African American 56 (L) > OR = 60  mL/min/1.7360m GFR, Est African American 65 > OR = 60 mL/min/1.79m32mBUN/Creatinine Ratio 14 6 - 22 (calc)   Sodium 140 135 - 146 mmol/L   Potassium 4.3 3.5 - 5.3 mmol/L   Chloride 103 98 - 110 mmol/L   CO2 28 20 - 32 mmol/L   Calcium 9.5 8.6 - 10.4 mg/dL   Total Protein 6.9 6.1 - 8.1 g/dL   Albumin 4.4 3.6 - 5.1 g/dL   Globulin 2.5 1.9 - 3.7 g/dL (calc)   AG Ratio 1.8 1.0 - 2.5 (calc)   Total Bilirubin 0.7 0.2 - 1.2 mg/dL   Alkaline phosphatase (APISO) 56 33 - 130 U/L   AST 22 10 - 35 U/L   ALT 14 6 - 29 U/L  TSH     Status: None   Collection Time: 09/12/18  9:03 AM  Result Value Ref Range   TSH 1.19 0.40 - 4.50 mIU/L  Lipid  panel     Status: Abnormal   Collection Time: 09/12/18  9:03 AM  Result Value Ref Range   Cholesterol 127 <200 mg/dL   HDL 50 (L) >50 mg/dL   Triglycerides 90 <150 mg/dL   LDL Cholesterol (Calc) 60 mg/dL (calc)    Comment: Reference range: <100 . Desirable range <100 mg/dL for primary prevention;   <70 mg/dL for patients with CHD or diabetic patients  with > or = 2 CHD risk factors. [place] LDL-C is now calculated using the Martin-Hopkins  calculation, which is a validated novel method providing  better accuracy than the Friedewald equation in the  estimation of LDL-C.  Cresenciano Genre et al. Annamaria Helling. 6433;295(18): 2061-2068  (http://education.[facility].com/faq/FAQ164)    Total CHOL/HDL Ratio 2.5 <5.0 (calc)   Non-HDL Cholesterol (Calc) 77 <130 mg/dL (calc)    Comment: For patients with diabetes plus 1 major ASCVD risk  factor, treating to a non-HDL-C goal of <100 mg/dL  (LDL-C of <70 mg/dL) is considered a therapeutic  option.   CBC with Differential/Platelet     Status: None   Collection Time: 09/12/18  9:03 AM  Result Value Ref Range   WBC 6.5 3.8 - 10.8 Thousand/uL   RBC 3.81 3.80 - 5.10 Million/uL   Hemoglobin 12.1 11.7 - 15.5 g/dL   HCT 36.0 35.0 - 45.0 %   MCV 94.5 80.0 - 100.0 fL   MCH 31.8 27.0 - 33.0 pg   MCHC 33.6 32.0 - 36.0 g/dL    RDW 13.1 11.0 - 15.0 %   Platelets 215 140 - 400 Thousand/uL   MPV 9.7 7.5 - 12.5 fL   Neutro Abs 4,771 1,500 - 7,800 cells/uL   Lymphs Abs 1,261 850 - 3,900 cells/uL   WBC mixed population 390 200 - 950 cells/uL   Eosinophils Absolute 59 15 - 500 cells/uL   Basophils Absolute 20 0 - 200 cells/uL   Neutrophils Relative % 73.4 %   Total Lymphocyte 19.4 %   Monocytes Relative 6.0 %   Eosinophils Relative 0.9 %   Basophils Relative 0.3 %     PHQ2/9: Depression screen [facility] 2/9 07/02/2018 05/24/2018 05/17/2018 05/17/2018 04/05/2018  Decreased Interest 0 0 0 0 0  Down, Depressed, Hopeless 1 0 0 0 0  PHQ - 2 Score 1 0 0 0 0  Altered sleeping - - 0 2 2  Tired, decreased energy - - 0 1 2  Change in appetite - - 0 0 0  Feeling bad or failure about yourself  - - 0 0 0  Trouble concentrating - - 0 0 0  Moving slowly or fidgety/restless - - 0 0 0  Suicidal thoughts - - 0 0 0  PHQ-9 Score - - 0 3 4  Difficult doing work/chores - - Not difficult at all Somewhat difficult Somewhat difficult    Fall Risk: Fall Risk  09/17/2018 07/02/2018 05/24/2018 05/17/2018 05/17/2018  Falls in the past year? 0 No No No No  Number falls in past yr: - - - - -  Injury with Fall? - - - - -  Comment - - - - -  Risk for fall due to : - - - Impaired vision;Other (Comment);Medication side effect -  Risk for fall due to: Comment - - - contacts; R cataract; fatigue, dyspnea; s/p cardioversion -  Follow up - - - - -     Assessment & Plan  1. Dyslipidemia  - atorvastatin (LIPITOR) 20 MG tablet; Take one daily  Dispense: 90  tablet; Refill: 1  2. Essential hypertension  - valsartan (DIOVAN) 320 MG tablet; Take 1 tablet (320 mg total) by mouth daily.  Dispense: 90 tablet; Refill: 1  3. CKD (chronic kidney disease), stage III (HCC)  GFR has improved  - valsartan (DIOVAN) 320 MG tablet; Take 1 tablet (320 mg total) by mouth daily.  Dispense: 90 tablet; Refill: 1  4. Atrial fibrillation, unspecified type ([place])  Rate  controlled, patient denies recent episodes of palpitation  5. Chronic combined systolic (congestive) and diastolic (congestive) heart failure (HCC)  On ARB, recently Dr. Rockey Situ added CArdura, also on metoprolol and statin therapy, getting her strength back   6. Pulmonary hypertension ([place])   7. GAD (generalized anxiety disorder)  - busPIRone (BUSPAR) 5 MG tablet; Take 1 tablet (5 mg total) by mouth 2 (two) times daily.  Dispense: 180 tablet; Refill: 0  8. Hypothyroidism, unspecified type  - levothyroxine (SYNTHROID, LEVOTHROID) 137 MCG tablet; TAKE 1 TABLET(137 MCG) BY MOUTH EVERY MORNING  Dispense: 90 tablet; Refill: 1  9. 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 Chronic neck pain   11. Senile purpura ([place])  Gave reassurance

## 2018-10-29 ENCOUNTER — Other Ambulatory Visit: Payer: Self-pay | Admitting: Family Medicine

## 2018-10-29 DIAGNOSIS — F5102 Adjustment insomnia: Secondary | ICD-10-CM

## 2018-10-29 NOTE — Telephone Encounter (Signed)
Refill request for general medication: Temazepam 15 mg  Last office visit: 09/17/2018  Last physical exam: 05/17/2018  Follow-ups on file. 01/16/2019

## 2018-10-30 DIAGNOSIS — C44519 Basal cell carcinoma of skin of other part of trunk: Secondary | ICD-10-CM | POA: Diagnosis not present

## 2018-10-30 DIAGNOSIS — D485 Neoplasm of uncertain behavior of skin: Secondary | ICD-10-CM | POA: Diagnosis not present

## 2018-10-30 DIAGNOSIS — L821 Other seborrheic keratosis: Secondary | ICD-10-CM | POA: Diagnosis not present

## 2018-11-15 ENCOUNTER — Other Ambulatory Visit: Payer: Self-pay | Admitting: Family Medicine

## 2018-11-15 DIAGNOSIS — F411 Generalized anxiety disorder: Secondary | ICD-10-CM

## 2018-11-15 NOTE — Telephone Encounter (Signed)
Refill request for general medication. Buspar to [facility].   Last office visit 09/17/2018   Follow up on 01/16/2019

## 2018-11-15 NOTE — Telephone Encounter (Signed)
Refill request for general medication. Potassium and Lasix  Last office visit 09/17/2018  Follow up 01/16/2019

## 2018-11-16 ENCOUNTER — Other Ambulatory Visit: Payer: Self-pay | Admitting: Family Medicine

## 2018-11-16 DIAGNOSIS — N183 Chronic kidney disease, stage 3 unspecified: Secondary | ICD-10-CM

## 2018-11-16 DIAGNOSIS — I10 Essential (primary) hypertension: Secondary | ICD-10-CM

## 2018-11-25 DIAGNOSIS — C44519 Basal cell carcinoma of skin of other part of trunk: Secondary | ICD-10-CM | POA: Diagnosis not present

## 2018-11-25 DIAGNOSIS — C4491 Basal cell carcinoma of skin, unspecified: Secondary | ICD-10-CM | POA: Diagnosis not present

## 2018-12-16 ENCOUNTER — Other Ambulatory Visit: Payer: Self-pay | Admitting: Cardiovascular Disease

## 2018-12-26 NOTE — Progress Notes (Signed)
Patient ID: [name], female    DOB: September 20, 1941, 78 y.o.   MRN: [id number]  HPI  [name] is a 78 y/o female with a history of atrial fibrillation, hyperlipidemia, HTN, thyroid disease, SVT, DJD and chronic heart failure.   Echo report from 03/21/18 reviewed and showed an EF of 45-50% along with moderate MR.   Has not been admitted or been in the ED in the last 6 months.    She presents today for a follow-up visit with a chief complaint of minimal fatigue upon moderate exertion. She describes this as chronic in nature having been present for several years but does feel like it has improved. Does walk around [employer] for exercise multiple times during the week. She has associated cough, light-headedness, chronic pain and easy bruising along with this. She denies any difficulty sleeping, abdominal distention, palpitations, pedal edema, chest pain, shortness of breath or weight gain.   Past Medical History:  Diagnosis Date  . Bronchitis   . Chronic combined systolic (congestive) and diastolic (congestive) heart failure ([place])    a. 02/2018 Echo: EF 45-50%, diff HK, triv AI, mod MR, mildly dil LA/RA, nl RV fxn, mod TR. PASP 40-106mmHg.  Marland Kitchen DJD (degenerative joint disease), cervical   . History of stress test    a. 01/2008 MV: EF 76%. Fair ex tol. No ischemia.  . Hyperlipidemia   . Hypertension   . Hypothyroid   . LBBB (left bundle branch block)   . Leg pain    a. 01/2018 ABI's wnl.  . Persistent atrial fibrillation    a. 01/2018 Event monitor: 45 runs of SVT, longes 14.5 sec. SVT felt to be Afib/flutter-->2% burden. Longest run of AF 4h 24m (CHA2DS2VASc = 4-->Xarelto).  . 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 CARDIOVERSION N/A 03/26/2018   Procedure: CARDIOVERSION;  Surgeon: Nelva Bush, MD;  Location: [facility] ORS;  Service: Cardiovascular;  Laterality: N/A;  . CARDIOVERSION N/A  04/24/2018   Procedure: CARDIOVERSION;  Surgeon: Minna Merritts, MD;  Location: [facility] ORS;  Service: Cardiovascular;  Laterality: N/A;  . ELECTROPHYSIOLOGY STUDY N/A 02/27/2012   Procedure: ELECTROPHYSIOLOGY STUDY;  Surgeon: Evans Lance, MD;  Location: [facility] CATH LAB;  Service: Cardiovascular;  Laterality: N/A;  . EPS and ablation for SVT  5/13   slow pathway ablation by Dr Lovena Le  . EYE SURGERY     surgery for detatched retina  . SUPRAVENTRICULAR TACHYCARDIA ABLATION N/A 02/27/2012   Procedure: SUPRAVENTRICULAR TACHYCARDIA ABLATION;  Surgeon: Evans Lance, MD;  Location: [facility] CATH LAB;  Service: Cardiovascular;  Laterality: N/A;   Family History  Problem Relation Age of Onset  . Alzheimer's disease Mother   . Stroke Father   . Breast cancer Father   . Heart disease Father        Pacemaker  . Breast cancer Paternal Aunt   . Kidney cancer Maternal Grandmother   . Alzheimer's disease Maternal Grandfather   . Thyroid disease Paternal Grandmother   . Stroke Paternal Grandfather    Social History   Tobacco Use  . Smoking status: Never Smoker  . Smokeless tobacco: Never Used  . Tobacco comment: smoking cessation materials not required  Substance Use Topics  . Alcohol use: No   Allergies  Allergen Reactions  . Codeine Nausea And Vomiting  . Erythromycin Itching and Swelling  . Septra [Sulfamethoxazole-Trimethoprim] Swelling  .  Penicillins Swelling and Rash    Has patient had a PCN reaction causing immediate rash, facial/tongue/throat swelling, SOB or lightheadedness with hypotension: Yes Has patient had a PCN reaction causing severe rash involving mucus membranes or skin necrosis: No Has patient had a PCN reaction that required hospitalization: No Has patient had a PCN reaction occurring within the last 10 years: No If all of the above answers are "NO", then may proceed with Cephalosporin use.    Prior to Admission medications   Medication Sig Start Date End Date Taking?  Authorizing Provider  acetaminophen (TYLENOL) 650 MG CR tablet Take 1,300 mg by mouth every 8 (eight) hours as needed for pain.   Yes [provider]  amiodarone (PACERONE) 200 MG tablet Take 1 tablet (200 mg total) by mouth daily. 07/24/18  Yes Gollan, Kathlene November, MD  apixaban (ELIQUIS) 5 MG TABS tablet Take 1 tablet (5 mg total) by mouth 2 (two) times daily. 05/17/18  Yes Sowles, Drue Stager, MD  atorvastatin (LIPITOR) 20 MG tablet Take one daily 09/17/18  Yes Sowles, Drue Stager, MD  busPIRone (BUSPAR) 5 MG tablet TAKE 1 TABLET(5 MG) BY MOUTH TWICE DAILY 11/17/18  Yes Sowles, Drue Stager, MD  cholecalciferol (VITAMIN D) 1000 UNITS tablet Take 2,000 Units by mouth daily.   Yes [provider]  doxazosin (CARDURA) 1 MG tablet Take 1 tablet (1 mg total) by mouth 2 (two) times daily. 07/24/18  Yes Gollan, Kathlene November, MD  ezetimibe (ZETIA) 10 MG tablet TAKE 1 TABLET(10 MG) BY MOUTH DAILY 12/16/18  Yes Gollan, Kathlene November, MD  furosemide (LASIX) 20 MG tablet TAKE 1 TABLET(20 MG) BY MOUTH DAILY 11/17/18  Yes Sowles, Drue Stager, MD  levothyroxine (SYNTHROID, LEVOTHROID) 137 MCG tablet TAKE 1 TABLET(137 MCG) BY MOUTH EVERY MORNING 09/17/18  Yes Sowles, Drue Stager, MD  metoprolol succinate (TOPROL-XL) 50 MG 24 hr tablet TAKE 1 TABLET BY MOUTH TWICE DAILY WITH OR IMMEDIATELY FOLLOWING A MEAL 12/16/18  Yes Gollan, Kathlene November, MD  Multiple Vitamin (MULITIVITAMIN WITH MINERALS) TABS Take 1 tablet by mouth daily.   Yes [provider]  potassium chloride (K-DUR) 10 MEQ tablet TAKE 1 TABLET(10 MEQ) BY MOUTH DAILY 11/17/18  Yes Sowles, Drue Stager, MD  temazepam (RESTORIL) 15 MG capsule TAKE 1 CAPSULE(15 MG) BY MOUTH AT BEDTIME AS NEEDED FOR SLEEP 10/29/18  Yes Sowles, Drue Stager, MD  valsartan (DIOVAN) 320 MG tablet TAKE 1 TABLET(320 MG) BY MOUTH DAILY 11/17/18  Yes Steele Sizer, MD    Review of Systems  Constitutional: Positive for fatigue (at times). Negative for appetite change.  HENT: Negative for congestion,  postnasal drip and sore throat.   Eyes: Negative.   Respiratory: Positive for cough (minimal late in the day). Negative for chest tightness and shortness of breath.   Cardiovascular: Negative for chest pain, palpitations and leg swelling.  Gastrointestinal: Negative for abdominal distention and abdominal pain.  Endocrine: Negative.   Genitourinary: Negative.   Musculoskeletal: Positive for back pain and neck pain.  Skin: Negative.   Allergic/Immunologic: Negative.   Neurological: Positive for light-headedness (on occasion). Negative for dizziness.  Hematological: Negative for adenopathy. Bruises/bleeds easily.  Psychiatric/Behavioral: Negative for dysphoric mood and sleep disturbance (sleeping on 1 pillow). The patient is not nervous/anxious.    Vitals:   12/31/18 0848  BP: (!) 151/46  Pulse: (!) 58  Resp: 18  SpO2: 100%  Weight: 131 lb 4 oz (59.5 kg)  Height: 5\' 6"  (1.676 m)   Wt Readings from Last 3 Encounters:  12/31/18 131 lb 4 oz (  59.5 kg)  09/17/18 133 lb 14.4 oz (60.7 kg)  07/24/18 137 lb (62.1 kg)   Lab Results  Component Value Date   CREATININE 0.98 (H) 09/12/2018   CREATININE 1.29 (H) 07/04/2018   CREATININE 1.26 (H) 06/28/2018    Physical Exam Vitals signs and nursing note reviewed.  Constitutional:      Appearance: She is well-developed.  HENT:     Head: Normocephalic and atraumatic.  Neck:     Musculoskeletal: Normal range of motion and neck supple.     Vascular: No JVD.  Cardiovascular:     Rate and Rhythm: Normal rate and regular rhythm.  Pulmonary:     Effort: Pulmonary effort is normal. No respiratory distress.     Breath sounds: No wheezing or rales.  Abdominal:     General: There is no distension.     Palpations: Abdomen is soft.     Tenderness: There is no abdominal tenderness.  Musculoskeletal:        General: No tenderness.  Skin:    General: Skin is warm and dry.  Neurological:     Mental Status: She is alert and oriented to person,  place, and time.  Psychiatric:        Behavior: Behavior normal.        Thought Content: Thought content normal.    Assessment & Plan:  1: Chronic heart failure with mildly reduced ejection fraction- - NYHA class II - euvolemic today - weighing daily. Reminded her to call for an overnight weight gain gain of >2 pounds or a weekly weight gain of >5 pounds - weight 144.6 from last visit 6 months ago - not adding salt and has been reading food labels. Reminded to closely follow a 2000mg  sodium diet - EF >40% so would not qualify for entresto; valsartan at max dose - BNP 04/22/18 was 614.0 - PharmD reconciled medications with the patient - has received her flu vaccine for this season  2: HTN- - BP mildly elevated - discussed adding spironolactone for resistant HTN but patient isn't interested at this time; if added in the future would need to watch potassium and renal function closely - may benefit from renal ultrasound to rule out any structural issues - checking her BP 3 times daily at home; discussed that checking it daily but different times of day should be sufficient - saw PCP Ancil Boozer) 09/17/18 - BMP 09/12/18 reviewed and showed sodium 140, potassium 4.3, creatinine 0.98 and GFR 56  3: Atrial fibrillation- - saw cardiology Rockey Situ) 07/24/18 - currently rate controlled - checks her heart rate twice daily because if her HR is <50, she only takes 1/2 of her metoprolol - cardioverted June/ July 2019  Medication list was reviewed.  Return in 6 months or sooner for any questions/problems before then.     Marland Kitchen

## 2018-12-31 ENCOUNTER — Ambulatory Visit: Payer: Medicare Other | Attending: Family | Admitting: Family

## 2018-12-31 ENCOUNTER — Encounter: Payer: Self-pay | Admitting: Family

## 2018-12-31 ENCOUNTER — Encounter: Payer: Self-pay | Admitting: Pharmacist

## 2018-12-31 VITALS — BP 151/46 | HR 58 | Resp 18 | Ht 66.0 in | Wt 131.2 lb

## 2018-12-31 DIAGNOSIS — Z8249 Family history of ischemic heart disease and other diseases of the circulatory system: Secondary | ICD-10-CM | POA: Insufficient documentation

## 2018-12-31 DIAGNOSIS — E785 Hyperlipidemia, unspecified: Secondary | ICD-10-CM | POA: Diagnosis not present

## 2018-12-31 DIAGNOSIS — Z885 Allergy status to narcotic agent status: Secondary | ICD-10-CM | POA: Insufficient documentation

## 2018-12-31 DIAGNOSIS — Z8051 Family history of malignant neoplasm of kidney: Secondary | ICD-10-CM | POA: Diagnosis not present

## 2018-12-31 DIAGNOSIS — I5042 Chronic combined systolic (congestive) and diastolic (congestive) heart failure: Secondary | ICD-10-CM | POA: Diagnosis not present

## 2018-12-31 DIAGNOSIS — Z79890 Hormone replacement therapy (postmenopausal): Secondary | ICD-10-CM | POA: Insufficient documentation

## 2018-12-31 DIAGNOSIS — Z881 Allergy status to other antibiotic agents status: Secondary | ICD-10-CM | POA: Insufficient documentation

## 2018-12-31 DIAGNOSIS — Z803 Family history of malignant neoplasm of breast: Secondary | ICD-10-CM | POA: Insufficient documentation

## 2018-12-31 DIAGNOSIS — Z880 Allergy status to penicillin: Secondary | ICD-10-CM | POA: Insufficient documentation

## 2018-12-31 DIAGNOSIS — I4891 Unspecified atrial fibrillation: Secondary | ICD-10-CM | POA: Insufficient documentation

## 2018-12-31 DIAGNOSIS — I480 Paroxysmal atrial fibrillation: Secondary | ICD-10-CM

## 2018-12-31 DIAGNOSIS — E039 Hypothyroidism, unspecified: Secondary | ICD-10-CM | POA: Insufficient documentation

## 2018-12-31 DIAGNOSIS — Z79899 Other long term (current) drug therapy: Secondary | ICD-10-CM | POA: Insufficient documentation

## 2018-12-31 DIAGNOSIS — I5022 Chronic systolic (congestive) heart failure: Secondary | ICD-10-CM

## 2018-12-31 DIAGNOSIS — I10 Essential (primary) hypertension: Secondary | ICD-10-CM

## 2018-12-31 DIAGNOSIS — Z7901 Long term (current) use of anticoagulants: Secondary | ICD-10-CM | POA: Insufficient documentation

## 2018-12-31 DIAGNOSIS — I447 Left bundle-branch block, unspecified: Secondary | ICD-10-CM | POA: Insufficient documentation

## 2018-12-31 DIAGNOSIS — I110 Hypertensive heart disease with heart failure: Secondary | ICD-10-CM | POA: Insufficient documentation

## 2018-12-31 NOTE — Progress Notes (Signed)
[place] - PHARMACIST COUNSELING NOTE  ADHERENCE ASSESSMENT  Adherence strategy: pill box   Do you ever forget to take your medication? [] Yes (1) [x] No (0)  Do you ever skip doses due to side effects? [] Yes (1) [x] No (0)  Do you have trouble affording your medicines? [x] Yes (1) [] No (0)  Are you ever unable to pick up your medication due to transportation difficulties? [] Yes (1) [x] No (0)  Do you ever stop taking your medications because you don't believe they are helping? [] Yes (1) [x] No (0)  Total score _1______    Recommendations given to patient about increasing adherence: None. Did have issue with Eliquis when she gets [place], but is able to get it for free from the manufacturer when this happens.  Guideline-Directed Medical Therapy/Evidence Based Medicine    ACE/ARB/ARNI: valsartan 320 mg daily   Beta Blocker: metoprolol succinate 50 mg twice daily   Aldosterone Antagonist: none Diuretic: furosemide 20 mg daily    SUBJECTIVE   HPI: Here for follow up visit. Reports occasional dizziness or light-headedness that can occur randomly. Checks BP three times a day and weighs every morning.   Past Medical History:  Diagnosis Date  . Bronchitis   . Chronic combined systolic (congestive) and diastolic (congestive) heart failure ([place])    a. 02/2018 Echo: EF 45-50%, diff HK, triv AI, mod MR, mildly dil LA/RA, nl RV fxn, mod TR. PASP 40-68mmHg.  Marland Kitchen DJD (degenerative joint disease), cervical   . History of stress test    a. 01/2008 MV: EF 76%. Fair ex tol. No ischemia.  . Hyperlipidemia   . Hypertension   . Hypothyroid   . LBBB (left bundle branch block)   . Leg pain    a. 01/2018 ABI's wnl.  . Persistent atrial fibrillation    a. 01/2018 Event monitor: 45 runs of SVT, longes 14.5 sec. SVT felt to be Afib/flutter-->2% burden. Longest run of AF 4h 39m (CHA2DS2VASc = 4-->Xarelto).  . SVT (supraventricular tachycardia) ([facility])    a.  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 She reports good compliance with her medications. BP today elevated, she reports taking her medications today. Says today's BP is "good for her," and that it normally runs 160-170s at home. Checks BP three times daily. Endorses occasional dizziness/light-headedness that can occur randomly. Reports that BP can sometimes drop really low and she can tell when this occurs more than she feels episodes of high BP. Cardiologist has her check her HR and will take half of her metoprolol dose if HR < 50. Does not have to do this often, HR usually 50-55. Weighs daily and stays between 129-132 pounds. Never has more than a pound weight gain overnight.  PLAN Valsartan at max dose and titration of metoprolol limited by HR. Addition of spironolactone may be useful if resistant hypertension. Spironolactone was discussed with patient and she was hesitant to add anything at this time. Patient to see Cardiologist in early April. Encouraged to discuss options with him as well.    Time spent: 10 minutes  Elliott, Pharm.D. 12/31/2018 9:08 AM    Current Outpatient Medications:  .  acetaminophen (TYLENOL) 650 MG CR tablet, Take 1,300 mg by mouth every 8 (eight) hours as needed for pain., Disp: , Rfl:  .  amiodarone (PACERONE) 200 MG tablet, Take 1 tablet (200 mg total) by mouth daily., Disp: 90 tablet, Rfl:  3 .  apixaban (ELIQUIS) 5 MG TABS tablet, Take 1 tablet (5 mg total) by mouth 2 (two) times daily., Disp: 60 tablet, Rfl: 5 .  atorvastatin (LIPITOR) 20 MG tablet, Take one daily, Disp: 90 tablet, Rfl: 1 .  busPIRone (BUSPAR) 5 MG tablet, TAKE 1 TABLET(5 MG) BY MOUTH TWICE DAILY, Disp: 60 tablet, Rfl: 0 .  cholecalciferol (VITAMIN D) 1000 UNITS tablet, Take 2,000 Units by mouth daily., Disp: , Rfl:  .  doxazosin (CARDURA) 1 MG tablet, Take 1 tablet (1 mg total) by mouth 2 (two) times daily., Disp: 180 tablet, Rfl: 3 .  ezetimibe (ZETIA) 10 MG tablet, TAKE 1 TABLET(10 MG) BY MOUTH DAILY, Disp: 90 tablet, Rfl: 0 .  furosemide (LASIX) 20 MG tablet, TAKE 1 TABLET(20 MG) BY MOUTH DAILY, Disp: 90 tablet, Rfl: 1 .  levothyroxine (SYNTHROID, LEVOTHROID) 137 MCG tablet, TAKE 1 TABLET(137 MCG) BY MOUTH EVERY MORNING, Disp: 90 tablet, Rfl: 1 .  metoprolol succinate (TOPROL-XL) 50 MG 24 hr tablet, TAKE 1 TABLET BY MOUTH TWICE DAILY WITH OR IMMEDIATELY FOLLOWING A MEAL, Disp: 180 tablet, Rfl: 0 .  Multiple Vitamin (MULITIVITAMIN WITH MINERALS) TABS, Take 1 tablet by mouth daily., Disp: , Rfl:  .  potassium chloride (K-DUR) 10 MEQ tablet, TAKE 1 TABLET(10 MEQ) BY MOUTH DAILY, Disp: 90 tablet, Rfl: 1 .  temazepam (RESTORIL) 15 MG capsule, TAKE 1 CAPSULE(15 MG) BY MOUTH AT BEDTIME AS NEEDED FOR SLEEP, Disp: 30 capsule, Rfl: 2 .  valsartan (DIOVAN) 320 MG tablet, TAKE 1 TABLET(320 MG) BY MOUTH DAILY, Disp: 90 tablet, Rfl: 0   COUNSELING POINTS/CLINICAL PEARLS  Metoprolol Succinate (Goal: 200 mg once daily) Warn patient to avoid activities requiring mental alertness or coordination until drug effects are realized, as drug may cause dizziness. Tell patient planning major surgery with anesthesia to alert physician that drug is being used, as drug impairs ability of heart to respond to reflex adrenergic stimuli. Drug may cause diarrhea, fatigue, headache, or depression. Advise diabetic patient to carefully monitor  blood glucose as drug may mask symptoms of hypoglycemia. Patient should take extended-release tablet with or immediately following meals. Counsel patient against sudden discontinuation of drug, as this may precipitate hypertension, angina, or myocardial infarction. In the event of a missed dose, counsel patient to skip the missed dose and maintain a regular dosing schedule. Valsartan (Goal: 160 mg twice daily)  Advise patient to report lightheadedness or syncope.  Tell patient to avoid activities requiring coordination until drug effects are realized, as this medicine may cause dizziness.  Side effects may include abdominal pain, diarrhea, hypotension, headache, cough, or fatigue.  Advise patient to avoid potassium supplements and foods/salt substitutes that are high in potassium. Furosemide  Drug causes sun-sensitivity. Advise patient to use sunscreen and avoid tanning beds. Patient should avoid activities requiring  coordination until drug effects are realized, as drug may cause dizziness, vertigo, or blurred vision. This drug may cause hyperglycemia, hyperuricemia, constipation, diarrhea, loss of appetite, nausea, vomiting, purpuric disorder, cramps, spasticity, asthenia, headache, paresthesia, or scaling eczema. Instruct patient to report unusual bleeding/bruising or signs/symptoms of hypotension, infection, pancreatitis, or ototoxicity (tinnitus, hearing impairment). Advise patient to report signs/symptoms of a severe skin reactions (flu-like symptoms, spreading red rash, or skin/mucous membrane blistering) or erythema multiforme. Instruct patient to eat high-potassium foods during drug therapy, as directed by healthcare professional.  Patient should not drink alcohol while taking this drug.   DRUGS TO AVOID IN HEART FAILURE  Drug or Class Mechanism  Analgesics . NSAIDs . COX-2 inhibitors . Glucocorticoids  Sodium and water retention, increased systemic vascular resistance, decreased  response to diuretics   Diabetes Medications . Metformin . Thiazolidinediones o Rosiglitazone (Avandia) o Pioglitazone (Actos) . DPP4 Inhibitors o Saxagliptin (Onglyza) o Sitagliptin (Januvia)   Lactic acidosis Possible calcium channel blockade   Unknown  Antiarrhythmics . Class I  o Flecainide o Disopyramide . Class III o Sotalol . Other o Dronedarone  Negative inotrope, proarrhythmic   Proarrhythmic, beta blockade  Negative inotrope  Antihypertensives . Alpha Blockers o Doxazosin . Calcium Channel Blockers o Diltiazem o Verapamil o Nifedipine . Central Alpha Adrenergics o Moxonidine . Peripheral Vasodilators o Minoxidil  Increases renin and aldosterone  Negative inotrope    Possible sympathetic withdrawal  Unknown  Anti-infective . Itraconazole . Amphotericin B  Negative inotrope Unknown  Hematologic . Anagrelide . Cilostazol   Possible inhibition of PD IV Inhibition of PD III causing arrhythmias  Neurologic/Psychiatric . Stimulants . Anti-Seizure Drugs o Carbamazepine o Pregabalin . Antidepressants o Tricyclics o Citalopram . Parkinsons o Bromocriptine o Pergolide o Pramipexole . Antipsychotics o Clozapine . Antimigraine o Ergotamine o Methysergide . Appetite suppressants . Bipolar o Lithium  Peripheral alpha and beta agonist activity  Negative inotrope and chronotrope Calcium channel blockade  Negative inotrope, proarrhythmic Dose-dependent QT prolongation  Excessive serotonin activity/valvular damage Excessive serotonin activity/valvular damage Unknown  IgE mediated hypersensitivy, calcium channel blockade  Excessive serotonin activity/valvular damage Excessive serotonin activity/valvular damage Valvular damage  Direct myofibrillar degeneration, adrenergic stimulation  Antimalarials . Chloroquine . Hydroxychloroquine Intracellular inhibition of lysosomal enzymes  Urologic Agents . Alpha  Blockers o Doxazosin o Prazosin o Tamsulosin o Terazosin  Increased renin and aldosterone  Adapted from Page RL, et al. "Drugs That May Cause or Exacerbate Heart Failure: A Scientific Statement from the [employer]." Circulation 2016; 209:O70-J62. DOI: 10.1161/CIR.0000000000000426   MEDICATION ADHERENCES TIPS AND STRATEGIES 1. Taking medication as prescribed improves patient outcomes in heart failure (reduces hospitalizations, improves symptoms, increases survival) 2. Side effects of medications can be managed by decreasing doses, switching agents, stopping drugs, or adding additional therapy. Please let someone in the [facility] know if you have having bothersome side effects so we can modify your regimen. Do not alter your medication regimen without talking to [place].  3. Medication reminders can help patients remember to take drugs on time. If you are missing or forgetting doses you can try linking behaviors, using pill boxes, or an electronic reminder like an alarm on your phone or an app. Some people can also get automated phone calls as medication reminders.

## 2018-12-31 NOTE — Patient Instructions (Signed)
Continue weighing daily and call for an overnight weight gain of > 2 pounds or a weekly weight gain of >5 pounds. 

## 2019-01-16 ENCOUNTER — Ambulatory Visit: Payer: Medicare Other | Admitting: Family Medicine

## 2019-01-17 ENCOUNTER — Telehealth: Payer: Self-pay

## 2019-01-17 NOTE — Telephone Encounter (Signed)
Virtual Visit Pre-Appointment Phone Call  Steps For Call:  1. Confirm consent - "In the setting of the current Covid19 crisis, you are scheduled for a TELEPHONE visit with your provider on 01/21/2019 at 1:00pm.  Just as we do with many in-office visits, in order for you to participate in this visit, we must obtain consent.  If you'd like, I can send this to your mychart (if signed up) or email for you to review.  Otherwise, I can obtain your verbal consent now.  All virtual visits are billed to your insurance company just like a normal visit would be.  By agreeing to a virtual visit, we'd like you to understand that the technology does not allow for your provider to perform an examination, and thus may limit your provider's ability to fully assess your condition.  Finally, though the technology is pretty good, we cannot assure that it will always work on either your or our end, and in the setting of a video visit, we may have to convert it to a phone-only visit.  In either situation, we cannot ensure that we have a secure connection.  Are you willing to proceed?" YES  2. Give patient instructions for WebEx download to smartphone as below if video visit  3. Advise patient to be prepared with any vital sign or heart rhythm information, their current medicines, and a piece of paper and pen handy for any instructions they may receive the day of their visit  4. Inform patient they will receive a phone call 15 minutes prior to their appointment time (may be from unknown caller ID) so they should be prepared to answer  5. Confirm that appointment type is correct in Epic appointment notes (video vs telephone)    TELEPHONE CALL NOTE  [name] has been deemed a candidate for a follow-up tele-health visit to limit community exposure during the Covid-19 pandemic. I spoke with the patient via phone to ensure availability of phone/video source, confirm preferred email & phone number, and discuss  instructions and expectations.  I reminded [name] to be prepared with any vital sign and/or heart rhythm information that could potentially be obtained via home monitoring, at the time of her visit. I reminded [name] to expect a phone call at the time of her visit if her visit.  Did the patient verbally acknowledge consent to treatment? YES  Janan Ridge, [place] 01/17/2019 2:35 PM   DOWNLOADING THE [place], go to [employer] and type in WebEx in the search bar. [place] [employer], the blue/green circle. The app is free but as with any other app downloads, their phone may require them to verify saved payment information or Apple password. The patient does NOT have to create an account.  - If Android, ask patient to go to [employer] and type in WebEx in the search bar. [place] [employer], the blue/green circle. The app is free but as with any other app downloads, their phone may require them to verify saved payment information or Android password. The patient does NOT have to create an account.   CONSENT FOR TELE-HEALTH VISIT - PLEASE REVIEW  I hereby voluntarily request, consent and authorize [facility] and its employed or contracted physicians, physician assistants, nurse practitioners or other licensed health care professionals (the Practitioner), to provide me with telemedicine health care services (the "Services") as deemed necessary by the treating Practitioner. I  acknowledge and consent to receive the Services by the Practitioner via telemedicine. I understand that the telemedicine visit will involve communicating with the Practitioner through live audiovisual communication technology and the disclosure of certain medical information by electronic transmission. I acknowledge that I have been given the opportunity to request an in-person assessment or other available alternative prior to the telemedicine visit and  am voluntarily participating in the telemedicine visit.  I understand that I have the right to withhold or withdraw my consent to the use of telemedicine in the course of my care at any time, without affecting my right to future care or treatment, and that the Practitioner or I may terminate the telemedicine visit at any time. I understand that I have the right to inspect all information obtained and/or recorded in the course of the telemedicine visit and may receive copies of available information for a reasonable fee.  I understand that some of the potential risks of receiving the Services via telemedicine include:  Marland Kitchen Delay or interruption in medical evaluation due to technological equipment failure or disruption; . Information transmitted may not be sufficient (e.g. poor resolution of images) to allow for appropriate medical decision making by the Practitioner; and/or  . In rare instances, security protocols could fail, causing a breach of personal health information.  Furthermore, I acknowledge that it is my responsibility to provide information about my medical history, conditions and care that is complete and accurate to the best of my ability. I acknowledge that Practitioner's advice, recommendations, and/or decision may be based on factors not within their control, such as incomplete or inaccurate data provided by me or distortions of diagnostic images or specimens that may result from electronic transmissions. I understand that the practice of medicine is not an exact science and that Practitioner makes no warranties or guarantees regarding treatment outcomes. I acknowledge that I will receive a copy of this consent concurrently upon execution via email to the email address I last provided but may also request a printed copy by calling the office of [place].    I understand that my insurance will be billed for this visit.   I have read or had this consent read to me. . I understand the  contents of this consent, which adequately explains the benefits and risks of the Services being provided via telemedicine.  . I have been provided ample opportunity to ask questions regarding this consent and the Services and have had my questions answered to my satisfaction. . I give my informed consent for the services to be provided through the use of telemedicine in my medical care  By participating in this telemedicine visit I agree to the above.

## 2019-01-20 NOTE — Progress Notes (Addendum)
Virtual Visit via Telephone Note   This visit type was conducted due to national recommendations for restrictions regarding the COVID-19 Pandemic (e.g. social distancing) in an effort to limit this patient's exposure and mitigate transmission in our community.  Due to her co-morbid illnesses, this patient is at least at moderate risk for complications without adequate follow up.  This format is felt to be most appropriate for this patient at this time.  The patient did not have access to video technology/had technical difficulties with video requiring transitioning to audio format only (telephone).  All issues noted in this document were discussed and addressed.  No physical exam could be performed with this format.  Please refer to the patient's chart for her  consent to telehealth for [facility].    Date:  01/21/2019   ID:  [name], DOB 1941/09/22, MRN [id number]  Patient Location:  [address] [place] [address]   Provider location:   [facility], [place] office  PCP:  Steele Sizer, MD  Cardiologist:  Ida Rogue, MD   Chief Complaint: Palpitations    History of Present Illness:    [name] is a 78 y.o. female who presents via audio/video conferencing for a telehealth visit today.   The patient does not symptoms concerning for COVID-19 infection (fever, chills, cough, or new SHORTNESS OF BREATH).  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 Lovena Le to have sinus tachycardia, lost to cardiology follow-up Carotid u/s  01/2018 with no significant blockages Ejection fraction 45 to 50%, mild to moderately elevated right heart pressures on echo May 2019 Several cardioversions for atrial fibrillation June and July 2019 Who presents for follow-up of her paroxysmal   atrial fibrillation  hospital September 2019 hypokalemia, cystitis with hematuria, acute kidney injury Lasix was held while she was in the hospital, 1 dose given prior to discharge IV Creatinine at discharge 1.26 BUN 20 potassium 4.9 On arrival potassium was 2.6 creatinine 1.57  Prior CV studies:   The following studies were reviewed today:  in the hospital July 2019 for acute on chronic combined CHF Persistent atrial fibrillation with RVR and respiratory distress with hypoxia Treated with Lasix Status post briefly successful DCCV on 6/4. back in A. fib approximately 6/7 to 6/8 DCCV 7/3, successful, bradycardia  Event monitor paroxysmal atrial fibrillation   mild carotid stenosis on the left Initially found on x-ray of neck.  vascular study done back in 2011 that showed mild plaque formation. Repeat ultrasound April 2019 with plaque noted,  less than 50%   Somedays blood pressure drops 144/71, today  Sometimes 115, feels tired Taking Lasix daily No swelling , no SOB No recent BMP since November 2019 at which time it was within normal limits  She does report 7 pound weight loss in the past several months, feels her weight is now more stable Was told by primary care not to lose anymore weight  Has a boyfriend, goes walking, he checks twice a day  Total chol 127, LDL 60 Occasional palpitations    Past Medical History:  Diagnosis Date  . Bronchitis   . Chronic combined systolic (congestive) and diastolic (congestive) heart failure ([place])    a. 02/2018 Echo: EF 45-50%, diff HK, triv AI, mod MR, mildly dil LA/RA, nl RV fxn, mod TR. PASP 40-17mmHg.  Marland Kitchen  DJD (degenerative joint disease), cervical   . History of stress test    a. 01/2008 MV: EF 76%. Fair ex tol. No ischemia.  . Hyperlipidemia   . Hypertension   . Hypothyroid   . LBBB (left bundle branch block)   . Leg pain    a. 01/2018 ABI's wnl.  . Persistent atrial fibrillation    a. 01/2018 Event monitor: 45 runs of  SVT, longes 14.5 sec. SVT felt to be Afib/flutter-->2% burden. Longest run of AF 4h 55m (CHA2DS2VASc = 4-->Xarelto).  . SVT (supraventricular tachycardia) ([facility])    a. 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 CARDIOVERSION N/A 03/26/2018   Procedure: CARDIOVERSION;  Surgeon: Nelva Bush, MD;  Location: [facility] ORS;  Service: Cardiovascular;  Laterality: N/A;  . CARDIOVERSION N/A 04/24/2018   Procedure: CARDIOVERSION;  Surgeon: Minna Merritts, MD;  Location: [facility] ORS;  Service: Cardiovascular;  Laterality: N/A;  . ELECTROPHYSIOLOGY STUDY N/A 02/27/2012   Procedure: ELECTROPHYSIOLOGY STUDY;  Surgeon: Evans Lance, MD;  Location[facility] C[facility] LAB;  Service: Cardiovascular;  Laterality: N/A;  . EPS and ablation for SVT  5/13   slow pathway ablation by Dr Lovena Le  . EYE SURGERY     surgery for detatched retina  . SUPRAVENTRICULAR TACHYCARDIA ABLATION N/A 02/27/2012   Procedure: SUPRAVENTRICULAR TACHYCARDIA ABLATION;  Surgeon: Evans Lance, MD;  Location: [facility] C[facility] LAB;  Service: Cardiovascular;  Laterality: N/A;     No outpatient medications have been marked as taking for the 01/21/19 encounter (Appointment) with Minna Merritts, MD.     Allergies:   Codeine; Erythromycin; Septra [sulfamethoxazole-trimethoprim]; and Penicillins   Social History   Tobacco Use  . Smoking status: Never Smoker  . Smokeless tobacco: Never Used  . Tobacco comment: smoking cessation materials not required  Substance Use Topics  . Alcohol use: No  . Drug use: No     Current Outpatient Medications on File Prior to Visit  Medication Sig Dispense Refill  . acetaminophen (TYLENOL) 650 MG CR tablet Take 1,300 mg by mouth every 8 (eight) hours as needed for pain.    Marland Kitchen amiodarone (PACERONE) 200 MG tablet Take 1 tablet (200 mg total) by mouth daily. 90 tablet 3  . apixaban (ELIQUIS) 5 MG TABS tablet Take 1 tablet (5 mg total) by mouth 2 (two) times daily. 60 tablet 5   . atorvastatin (LIPITOR) 20 MG tablet Take one daily 90 tablet 1  . busPIRone (BUSPAR) 5 MG tablet TAKE 1 TABLET(5 MG) BY MOUTH TWICE DAILY 60 tablet 0  . cholecalciferol (VITAMIN D) 1000 UNITS tablet Take 2,000 Units by mouth daily.    Marland Kitchen doxazosin (CARDURA) 1 MG tablet Take 1 tablet (1 mg total) by mouth 2 (two) times daily. 180 tablet 3  . ezetimibe (ZETIA) 10 MG tablet TAKE 1 TABLET(10 MG) BY MOUTH DAILY 90 tablet 0  . furosemide (LASIX) 20 MG tablet TAKE 1 TABLET(20 MG) BY MOUTH DAILY 90 tablet 1  . levothyroxine (SYNTHROID, LEVOTHROID) 137 MCG tablet TAKE 1 TABLET(137 MCG) BY MOUTH EVERY MORNING 90 tablet 1  . metoprolol succinate (TOPROL-XL) 50 MG 24 hr tablet TAKE 1 TABLET BY MOUTH TWICE DAILY WITH OR IMMEDIATELY FOLLOWING A MEAL 180 tablet 0  . Multiple Vitamin (MULITIVITAMIN WITH MINERALS) TABS Take 1 tablet by mouth daily.    . potassium chloride (K-DUR) 10 MEQ tablet TAKE 1 TABLET(10 MEQ) BY MOUTH DAILY 90 tablet 1  . temazepam (  RESTORIL) 15 MG capsule TAKE 1 CAPSULE(15 MG) BY MOUTH AT BEDTIME AS NEEDED FOR SLEEP 30 capsule 2  . valsartan (DIOVAN) 320 MG tablet TAKE 1 TABLET(320 MG) BY MOUTH DAILY 90 tablet 0   No current facility-administered medications on file prior to visit.      Family Hx: The patient's family history includes Alzheimer's disease in her maternal grandfather and mother; Breast cancer in her father and paternal aunt; Heart disease in her father; Kidney cancer in her maternal grandmother; Stroke in her father and paternal grandfather; Thyroid disease in her paternal grandmother.  ROS:   Please see the history of present illness.    Review of Systems  Constitutional: Positive for malaise/fatigue.  Respiratory: Negative.   Cardiovascular: Positive for palpitations.  Gastrointestinal: Negative.   Musculoskeletal: Negative.   Neurological: Negative.   Psychiatric/Behavioral: Negative.   All other systems reviewed and are negative.    Labs/Other Tests and  Data Reviewed:    Recent Labs: 04/22/2018: B Natriuretic Peptide 614.0 07/04/2018: Magnesium 2.0 09/12/2018: ALT 14; BUN 14; Creat 0.98; Hemoglobin 12.1; Platelets 215; Potassium 4.3; Sodium 140; TSH 1.19   Recent Lipid Panel Lab Results  Component Value Date/Time   CHOL 127 09/12/2018 09:03 AM   CHOL 164 06/06/2016 09:07 AM   TRIG 90 09/12/2018 09:03 AM   HDL 50 (L) 09/12/2018 09:03 AM   HDL 54 06/06/2016 09:07 AM   CHOLHDL 2.5 09/12/2018 09:03 AM   LDLCALC 60 09/12/2018 09:03 AM    Wt Readings from Last 3 Encounters:  12/31/18 131 lb 4 oz (59.5 kg)  09/17/18 133 lb 14.4 oz (60.7 kg)  07/24/18 137 lb (62.1 kg)     Exam:    Vital Signs:  There were no vitals taken for this visit.   Well nourished, well developed female in no acute distress.   ASSESSMENT & PLAN:    Chronic systolic heart failure (HCC) Sounds euvolemic per her report Concern for prerenal state, given periodic low blood pressure Denies any orthostasis symptoms Recommend she hold Lasix for 1 day for any low blood pressure  Paroxysmal atrial fibrillation (HCC) Maintaining normal sinus rhythm, no palpitations, continue metoprolol with amiodarone and Eliquis  Essential hypertension Some low blood pressures possibly exacerbated by 7 pound weight loss past several months Recommend she check orthostatics at home and call us if blood pressure drops low We could decrease her medication such as decrease doxazosin down to 1 a day or decrease the valsartan in half  PAD (peripheral artery disease) ([place]) Denies any claudication symptoms  SVT (supraventricular tachycardia) (HCC) No tachycardia only rare palpitation  Mixed hyperlipidemia Numbers at goal   pulmonary hypertension, unspecified ([place]) Denies significant shortness of breath, treated with Lasix  Carotid artery calcification, bilateral Minimal disease, no further imaging at this time  CKD (chronic kidney disease), stage III ([place]) Improved numbers  November 2019, reviewed with her in detail Previously with elevated numbers in the setting of cystitis and renal ATN  Weight loss 7 pound weight loss in the past several months, recommend she increase her calorie intake   COVID-19 Education: The signs and symptoms of COVID-19 were discussed with the patient and how to seek care for testing (follow up with PCP or arrange E-visit).  The importance of social distancing was discussed today.  Patient Risk:   After full review of this patients clinical status, I feel that they are at least moderate risk at this time.  Time:   Today, I have spent 25 minutes  with the patient with telehealth technology discussing atrial fibrillation, palpitations, low blood pressure, orthostasis, weight loss, monitoring blood pressure at home.     Medication Adjustments/Labs and Tests Ordered: Current medicines are reviewed at length with the patient today.  Concerns regarding medicines are outlined above.   Tests Ordered: No tests ordered   Medication Changes: No changes made   Disposition: Follow-up in 6 months   Signed, Ida Rogue, MD  01/21/2019 1:12 PM    [place] Office [address], [place], [place] [address]

## 2019-01-21 ENCOUNTER — Telehealth (INDEPENDENT_AMBULATORY_CARE_PROVIDER_SITE_OTHER): Payer: Medicare Other | Admitting: Cardiovascular Disease

## 2019-01-21 ENCOUNTER — Other Ambulatory Visit: Payer: Self-pay

## 2019-01-21 DIAGNOSIS — I272 Pulmonary hypertension, unspecified: Secondary | ICD-10-CM

## 2019-01-21 DIAGNOSIS — I10 Essential (primary) hypertension: Secondary | ICD-10-CM

## 2019-01-21 DIAGNOSIS — R0602 Shortness of breath: Secondary | ICD-10-CM

## 2019-01-21 DIAGNOSIS — E782 Mixed hyperlipidemia: Secondary | ICD-10-CM

## 2019-01-21 DIAGNOSIS — I739 Peripheral vascular disease, unspecified: Secondary | ICD-10-CM

## 2019-01-21 DIAGNOSIS — R634 Abnormal weight loss: Secondary | ICD-10-CM

## 2019-01-21 DIAGNOSIS — N183 Chronic kidney disease, stage 3 unspecified: Secondary | ICD-10-CM

## 2019-01-21 DIAGNOSIS — I6523 Occlusion and stenosis of bilateral carotid arteries: Secondary | ICD-10-CM

## 2019-01-21 DIAGNOSIS — I480 Paroxysmal atrial fibrillation: Secondary | ICD-10-CM | POA: Diagnosis not present

## 2019-01-21 DIAGNOSIS — I5022 Chronic systolic (congestive) heart failure: Secondary | ICD-10-CM | POA: Diagnosis not present

## 2019-01-21 DIAGNOSIS — I471 Supraventricular tachycardia: Secondary | ICD-10-CM

## 2019-01-21 MED ORDER — EZETIMIBE 10 MG PO TABS: ORAL_TABLET | ORAL | 3 refills | Status: DC

## 2019-01-21 MED ORDER — METOPROLOL SUCCINATE ER 50 MG PO TB24: ORAL_TABLET | ORAL | 3 refills | Status: DC

## 2019-01-21 MED ORDER — VALSARTAN 320 MG PO TABS: ORAL_TABLET | ORAL | 3 refills | Status: DC

## 2019-01-21 NOTE — Progress Notes (Signed)
Entered recall printed to sabrina to mail

## 2019-01-21 NOTE — Patient Instructions (Signed)
Medication Instructions:  Needs refills metop, zetia, valsartan  If you need a refill on your cardiac medications before your next appointment, please call your pharmacy.    Lab work: No new labs needed   If you have labs (blood work) drawn today and your tests are completely normal, you will receive your results only by: Marland Kitchen MyChart Message (if you have MyChart) OR . A paper copy in the mail If you have any lab test that is abnormal or we need to change your treatment, we will call you to review the results.   Testing/Procedures: No new testing needed   Follow-Up: At [facility], you and your health needs are our priority.  As part of our continuing mission to provide you with exceptional heart care, we have created designated Provider Care Teams.  These Care Teams include your primary Cardiologist (physician) and Advanced Practice Providers (APPs -  Physician Assistants and Nurse Practitioners) who all work together to provide you with the care you need, when you need it.  . You will need a follow up appointment in 6 months .   Please call our office 2 months in advance to schedule this appointment.    . Providers on your designated Care Team:   . Murray Hodgkins, NP . Christell Faith, PA-C . Marrianne Mood, PA-C  Any Other Special Instructions Will Be Listed Below (If Applicable).  For educational health videos Log in to : [email or web address] Or : [email or web address], password : [email or web address]

## 2019-01-21 NOTE — Addendum Note (Signed)
Addended by: Valora Corporal on: 01/21/2019 01:31 PM   Modules accepted: Orders

## 2019-01-28 ENCOUNTER — Ambulatory Visit: Payer: Medicare Other | Admitting: Cardiovascular Disease

## 2019-01-31 ENCOUNTER — Encounter: Payer: Self-pay | Admitting: Family Medicine

## 2019-01-31 ENCOUNTER — Ambulatory Visit (INDEPENDENT_AMBULATORY_CARE_PROVIDER_SITE_OTHER): Payer: Medicare Other | Admitting: Family Medicine

## 2019-01-31 ENCOUNTER — Other Ambulatory Visit: Payer: Self-pay

## 2019-01-31 VITALS — BP 130/55 | HR 53 | Wt 129.0 lb

## 2019-01-31 DIAGNOSIS — I130 Hypertensive heart and chronic kidney disease with heart failure and stage 1 through stage 4 chronic kidney disease, or unspecified chronic kidney disease: Secondary | ICD-10-CM

## 2019-01-31 DIAGNOSIS — F411 Generalized anxiety disorder: Secondary | ICD-10-CM

## 2019-01-31 DIAGNOSIS — I5042 Chronic combined systolic (congestive) and diastolic (congestive) heart failure: Secondary | ICD-10-CM | POA: Diagnosis not present

## 2019-01-31 DIAGNOSIS — I10 Essential (primary) hypertension: Secondary | ICD-10-CM

## 2019-01-31 DIAGNOSIS — I4891 Unspecified atrial fibrillation: Secondary | ICD-10-CM | POA: Diagnosis not present

## 2019-01-31 DIAGNOSIS — N183 Chronic kidney disease, stage 3 unspecified: Secondary | ICD-10-CM

## 2019-01-31 DIAGNOSIS — I739 Peripheral vascular disease, unspecified: Secondary | ICD-10-CM

## 2019-01-31 DIAGNOSIS — E039 Hypothyroidism, unspecified: Secondary | ICD-10-CM

## 2019-01-31 DIAGNOSIS — D692 Other nonthrombocytopenic purpura: Secondary | ICD-10-CM

## 2019-01-31 DIAGNOSIS — I779 Disorder of arteries and arterioles, unspecified: Secondary | ICD-10-CM

## 2019-01-31 NOTE — Progress Notes (Signed)
Name: [name]   MRN: [id number]    DOB: 05-Jan-1941   Date:01/31/2019       Progress Note  Subjective  Chief Complaint  Chief Complaint  Patient presents with  . Chronic Kidney Disease  . Atrial Fibrillation  . Hypertension    I connected with@ on 01/31/19 at 11:00 AM EDT by telephone and verified that I am speaking with the correct person using two identifiers.  I discussed the limitations, risks, security and privacy concerns of performing an evaluation and management service by telephone and the availability of in person appointments. Staff also discussed with the patient that there may be a patient responsible charge related to this service. Patient Location: at home  Provider Location: [facility]   HPI  Carotid calcification: found on x-ray of neck. She also had a vascular study done back in 2011 that showed mild plaque formationon right.Sheis now on statin, Zetia and Eliquis. Off aspirin since ablation 04/2018. She denies headache or dizziness, she states she has balance problems - but states secondary to visual disturbance ( she can see better from left eye than right). She went back to Dr. Erven Colla and had ABI that was normal, carotid doppler unchanged and will go back in 2021 . Unchanged  Cervical radiculitis:she is now going to [facility] under the care of Dr. Larose Hires , also had injections done by Dr. Orpah Melter, last visit Fall 2019. Pain is stable and will wait until COVID-19 is gone before re-scheduling appointment   HTN: bp at home has been in the 140's most of the time, but occasionally goes higher and sometimes goes down to 118 but denies dizziness but feels tired when it happens.  Afib: she had a cardioversion in June and another one in July 2019 , she has intermittent palpitation at most once a week, but no chest pain, rate controlled with amiodarone and metoprolol.  Compliant with medications, no side effects  CHF chronic: she is  down to one pillow at night. She has mild sob with moderate activity - like when doing yard work, no chest pain, denies lower extremity edema.   CKI: last GFR improved up to 56,  good urine output , no pruritis. On ARB, no side effects   GAD: doing well on Buspar twice daily, she also takes temazepam qhs prn and it helps her sleep, she states taking Temazepam at most once a week. She is doing better   Hypothyroidism: doing well on medication, denies hair loss, denies constipation or difficulty swallowing   Patient Active Problem List   Diagnosis Date Noted  . Chronic heart failure ([place]) 05/24/2018  . Atrial fibrillation ([place]) 05/24/2018  . CKD (chronic kidney disease), stage III ([place]) 05/17/2018  . 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 Shortness of breath 05/07/2018  . Pulmonary hypertension, unspecified ([place]) 04/05/2018  . Chronic combined systolic (congestive) and diastolic (congestive) heart failure ([place]) 04/05/2018  . Paroxysmal sinus tachycardia ([place]) 01/29/2018  . Leg pain 01/04/2018  . PAD (peripheral artery disease) ([place]) 01/04/2018  . Carotid artery calcification, bilateral 12/19/2017  . Cervical radiculitis 12/19/2017  . Hyperglycemia 03/06/2016  . Hypothyroid 04/21/2015  . 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 Anxiety 04/21/2015  . Hyperlipidemia 04/21/2015  . Diuretic-induced hypokalemia 04/21/2015  . Palpitations 12/18/2012  . Hypertension 05/27/2012    Past Surgical History:  Procedure Laterality Date  . CARDIOVERSION N/A 03/26/2018   Procedure: CARDIOVERSION;  Surgeon: Nelva Bush, MD;  Location: [facility] ORS;  Service: Cardiovascular;  Laterality: N/A;  . CARDIOVERSION N/A 04/24/2018   Procedure: CARDIOVERSION;  Surgeon: Minna Merritts, MD;  Location: [facility] ORS;  Service: Cardiovascular;  Laterality: N/A;  . ELECTROPHYSIOLOGY STUDY N/A 02/27/2012   Procedure: ELECTROPHYSIOLOGY STUDY;  Surgeon: Evans Lance, MD;  Location: [facility] CATH  LAB;  Service: Cardiovascular;  Laterality: N/A;  . EPS and ablation for SVT  5/13   slow pathway ablation by Dr Lovena Le  . EYE SURGERY     surgery for detatched retina  . SUPRAVENTRICULAR TACHYCARDIA ABLATION N/A 02/27/2012   Procedure: SUPRAVENTRICULAR TACHYCARDIA ABLATION;  Surgeon: Evans Lance, MD;  Location: [facility] CATH LAB;  Service: Cardiovascular;  Laterality: N/A;    Family History  Problem Relation Age of Onset  . Alzheimer's disease Mother   . Stroke Father   . Breast cancer Father   . Heart disease Father        Pacemaker  . Breast cancer Paternal Aunt   . Kidney cancer Maternal Grandmother   . Alzheimer's disease Maternal Grandfather   . Thyroid disease Paternal Grandmother   . Stroke Paternal Grandfather     Social History   Socioeconomic History  . Marital status: Widowed    Spouse name: Not on file  . Number of children: 1  . Years of education: Not on file  . Highest education level: 12th grade  Occupational History  . Occupation: Retired  Scientific laboratory technician  . Financial resource strain: Not hard at all  . Food insecurity:    Worry: Never true    Inability: Never true  . Transportation needs:    Medical: No    Non-medical: No  Tobacco Use  . Smoking status: Never Smoker  . Smokeless tobacco: Never Used  . Tobacco comment: smoking cessation materials not required  Substance and Sexual Activity  . Alcohol use: No  . Drug use: No  . Sexual activity: Not Currently    Partners: Male  Lifestyle  . Physical activity:    Days per week: 6 days    Minutes per session: 30 min  . Stress: Not at all  Relationships  . Social connections:    Talks on phone: More than three times a week    Gets together: More than three times a week    Attends religious service: More than 4 times per year    Active member of club or organization: Yes    Attends meetings of clubs or organizations: More than 4 times per year    Relationship status: Widowed  . Intimate partner  violence:    Fear of current or ex partner: No    Emotionally abused: No    Physically abused: No    Forced sexual activity: No  Other Topics Concern  . Not on file  Social History Narrative   Lives in [place], she has a boyfriend     Current Outpatient Medications:  .  acetaminophen (TYLENOL) 650 MG CR tablet, Take 1,300 mg by mouth every 8 (eight) hours as needed for pain., Disp: , Rfl:  .  amiodarone (PACERONE) 200 MG tablet, Take 1 tablet (200 mg total) by mouth daily., Disp: 90 tablet, Rfl: 3 .  apixaban (ELIQUIS) 5 MG TABS tablet, Take 1 tablet (5 mg total) by mouth 2 (two) times daily., Disp: 60 tablet, Rfl: 5 .  atorvastatin (LIPITOR) 20 MG tablet, Take one daily, Disp: 90 tablet, Rfl: 1 .  busPIRone (BUSPAR) 5 MG tablet, TAKE 1 TABLET(5 MG) BY MOUTH TWICE  DAILY, Disp: 60 tablet, Rfl: 0 .  cholecalciferol (VITAMIN D) 1000 UNITS tablet, Take 2,000 Units by mouth daily., Disp: , Rfl:  .  doxazosin (CARDURA) 1 MG tablet, Take 1 tablet (1 mg total) by mouth 2 (two) times daily., Disp: 180 tablet, Rfl: 3 .  ezetimibe (ZETIA) 10 MG tablet, TAKE 1 TABLET(10 MG) BY MOUTH DAILY, Disp: 90 tablet, Rfl: 3 .  furosemide (LASIX) 20 MG tablet, TAKE 1 TABLET(20 MG) BY MOUTH DAILY, Disp: 90 tablet, Rfl: 1 .  levothyroxine (SYNTHROID, LEVOTHROID) 137 MCG tablet, TAKE 1 TABLET(137 MCG) BY MOUTH EVERY MORNING, Disp: 90 tablet, Rfl: 1 .  metoprolol succinate (TOPROL-XL) 50 MG 24 hr tablet, TAKE 1 TABLET BY MOUTH TWICE DAILY WITH OR IMMEDIATELY FOLLOWING A MEAL, Disp: 180 tablet, Rfl: 3 .  Multiple Vitamin (MULITIVITAMIN WITH MINERALS) TABS, Take 1 tablet by mouth daily., Disp: , Rfl:  .  potassium chloride (K-DUR) 10 MEQ tablet, TAKE 1 TABLET(10 MEQ) BY MOUTH DAILY, Disp: 90 tablet, Rfl: 1 .  temazepam (RESTORIL) 15 MG capsule, TAKE 1 CAPSULE(15 MG) BY MOUTH AT BEDTIME AS NEEDED FOR SLEEP, Disp: 30 capsule, Rfl: 2 .  valsartan (DIOVAN) 320 MG tablet, TAKE 1 TABLET(320 MG) BY MOUTH DAILY, Disp: 90  tablet, Rfl: 3  Allergies  Allergen Reactions  . Codeine Nausea And Vomiting  . Erythromycin Itching and Swelling  . Septra [Sulfamethoxazole-Trimethoprim] Swelling  . Penicillins Swelling and Rash    Has patient had a PCN reaction causing immediate rash, facial/tongue/throat swelling, SOB or lightheadedness with hypotension: Yes Has patient had a PCN reaction causing severe rash involving mucus membranes or skin necrosis: No Has patient had a PCN reaction that required hospitalization: No Has patient had a PCN reaction occurring within the last 10 years: No If all of the above answers are "NO", then may proceed with Cephalosporin use.     I personally reviewed active problem list, medication list, allergies, family history, social history with the patient/caregiver today.   ROS  Ten systems reviewed and is negative except as mentioned in HPI   Objective  Virtual encounter, vitals not obtained.  Body mass index is 20.82 kg/m.  Physical Exam  Strong voice, oriented and alert   PHQ2/9: Depression screen [facility] 2/9 01/31/2019 07/02/2018 05/24/2018 05/17/2018 05/17/2018  Decreased Interest 0 0 0 0 0  Down, Depressed, Hopeless - 1 0 0 0  PHQ - 2 Score 0 1 0 0 0  Altered sleeping 1 - - 0 2  Tired, decreased energy 1 - - 0 1  Change in appetite 0 - - 0 0  Feeling bad or failure about yourself  0 - - 0 0  Trouble concentrating 1 - - 0 0  Moving slowly or fidgety/restless 0 - - 0 0  Suicidal thoughts 0 - - 0 0  PHQ-9 Score 3 - - 0 3  Difficult doing work/chores Not difficult at all - - Not difficult at all Somewhat difficult   PHQ-2/9 Result is negative.    Fall Risk: Fall Risk  01/31/2019 12/31/2018 09/17/2018 07/02/2018 05/24/2018  Falls in the past year? 0 0 0 No No  Number falls in past yr: 0 0 - - -  Injury with Fall? 0 0 - - -  Comment - - - - -  Risk for fall due to : - - - - -  Risk for fall due to: Comment - - - - -  Follow up - - - - -  Assessment & Plan  1.  Essential hypertension  At goal   2. CKD (chronic kidney disease), stage III (HCC)  Last GFR improved, but still below 60, we will continue to monitor, she also sees Dr. Abigail Butts   3. Atrial fibrillation, unspecified type ([place])  Stable, recent visit with Dr. Lissa Hoard  4. Chronic combined systolic (congestive) and diastolic (congestive) heart failure (HCC)  Doing well  5. GAD (generalized anxiety disorder)  Taking buspar and still has medication at home, states pharmacy will recheck refill when due   6. Hypothyroidism, unspecified type  Continue current dose  7. Senile purpura (HCC)  stable  8. 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 The patient was provided an opportunity to ask questions and all were answered. The patient agreed with the plan and demonstrated an understanding of the instructions.   The patient was advised to call back or seek an in-person evaluation if the symptoms worsen or if the condition fails to improve as anticipated.  I provided 21  minutes of non-face-to-face time during this encounter.  Loistine Chance, MD

## 2019-02-12 ENCOUNTER — Other Ambulatory Visit: Payer: Self-pay | Admitting: Family Medicine

## 2019-02-12 DIAGNOSIS — F411 Generalized anxiety disorder: Secondary | ICD-10-CM

## 2019-03-14 ENCOUNTER — Other Ambulatory Visit: Payer: Self-pay | Admitting: Cardiovascular Disease

## 2019-03-14 ENCOUNTER — Other Ambulatory Visit: Payer: Self-pay | Admitting: Family Medicine

## 2019-03-14 DIAGNOSIS — E039 Hypothyroidism, unspecified: Secondary | ICD-10-CM

## 2019-03-14 NOTE — Telephone Encounter (Signed)
Refill request for thyroid medication. Levothyroxine   Last visit 01/31/2019   Lab Results  Component Value Date   TSH 1.19 09/12/2018     Follow up on 05/23/2019

## 2019-03-26 ENCOUNTER — Ambulatory Visit: Payer: Self-pay | Admitting: Family Medicine

## 2019-03-26 NOTE — Chronic Care Management (AMB) (Signed)
Chronic Care Management   Note  03/26/2019 Name: [name] MRN: [id number] DOB: 1941/01/28  [name] is a 78 y.o. year old female who is a primary care patient of Steele Sizer, MD. I reached out to [name] by phone today in response to a referral sent by [name]'s health plan.    [name] was given information about Chronic Care Management services today including:  1. CCM service includes personalized support from designated clinical staff supervised by her physician, including individualized plan of care and coordination with other care providers 2. 24/7 contact phone numbers for assistance for urgent and routine care needs. 3. Service will only be billed when office clinical staff spend 20 minutes or more in a month to coordinate care. 4. Only one practitioner may furnish and bill the service in a calendar month. 5. The patient may stop CCM services at any time (effective at the end of the month) by phone call to the office staff. 6. The patient will be responsible for cost sharing (co-pay) of up to 20% of the service fee (after annual deductible is met).  Patient agreed to services and verbal consent obtained.   Follow up plan: Telephone appointment with CCM team member scheduled for: 03/31/2019  [place]  ??[email or web address]   ??[phone]

## 2019-03-31 ENCOUNTER — Telehealth: Payer: Self-pay | Admitting: Cardiovascular Disease

## 2019-03-31 ENCOUNTER — Ambulatory Visit (INDEPENDENT_AMBULATORY_CARE_PROVIDER_SITE_OTHER): Payer: Medicare Other | Admitting: Pharmacist

## 2019-03-31 DIAGNOSIS — E785 Hyperlipidemia, unspecified: Secondary | ICD-10-CM

## 2019-03-31 DIAGNOSIS — I4891 Unspecified atrial fibrillation: Secondary | ICD-10-CM

## 2019-03-31 DIAGNOSIS — N183 Chronic kidney disease, stage 3 unspecified: Secondary | ICD-10-CM

## 2019-03-31 DIAGNOSIS — I5042 Chronic combined systolic (congestive) and diastolic (congestive) heart failure: Secondary | ICD-10-CM | POA: Diagnosis not present

## 2019-03-31 MED ORDER — ROSUVASTATIN CALCIUM 40 MG PO TABS: 40.0000 mg | ORAL_TABLET | Freq: Every day | ORAL | 0 refills | Status: DC

## 2019-03-31 NOTE — Chronic Care Management (AMB) (Signed)
Chronic Care Management   Note  03/31/2019 Name: [name] MRN: [id number] DOB: 1941-02-23  Subjective:    Does the patient  feel that his/her medications are working for him/her?  yes  Has the patient been experiencing any side effects to the medications prescribed?  no  Does the patient measure his/her own blood glucose at home?  NA   Does the patient measure his/her own blood pressure at home? yes   Does the patient have any problems obtaining medications due to transportation or finances?   no  Understanding of regimen: excellent Understanding of indications: excellent Potential of compliance: good  Objective: Lab Results  Component Value Date   CREATININE 0.98 (H) 09/12/2018   CREATININE 1.29 (H) 07/04/2018   CREATININE 1.26 (H) 06/28/2018    Lab Results  Component Value Date   HGBA1C 5.7 05/17/2017    Lipid Panel     Component Value Date/Time   CHOL 127 09/12/2018 0903   CHOL 164 06/06/2016 0907   TRIG 90 09/12/2018 0903   HDL 50 (L) 09/12/2018 0903   HDL 54 06/06/2016 0907   CHOLHDL 2.5 09/12/2018 0903   VLDL 32 (H) 03/12/2017 0815   LDLCALC 60 09/12/2018 0903    BP Readings from Last 3 Encounters:  01/31/19 (!) 130/55  12/31/18 (!) 151/46  09/17/18 (!) 160/70    Allergies  Allergen Reactions  . Codeine Nausea And Vomiting  . Erythromycin Itching and Swelling  . Septra [Sulfamethoxazole-Trimethoprim] Swelling  . Penicillins Swelling and Rash    Has patient had a PCN reaction causing immediate rash, facial/tongue/throat swelling, SOB or lightheadedness with hypotension: Yes Has patient had a PCN reaction causing severe rash involving mucus membranes or skin necrosis: No Has patient had a PCN reaction that required hospitalization: No Has patient had a PCN reaction occurring within the last 10 years: No If all of the above answers are "NO", then may proceed with Cephalosporin use.     Medications Reviewed Today    Reviewed by Steele Sizer, MD (Physician) on 01/31/19 at 1124  Med List Status: <None>  Medication Order Taking? Sig Documenting Provider Last Dose Status Informant  acetaminophen (TYLENOL) 650 MG CR tablet [id number] Yes Take 1,300 mg by mouth every 8 (eight) hours as needed for pain. [provider] Taking Active Self  amiodarone (PACERONE) 200 MG tablet [id number] Yes Take 1 tablet (200 mg total) by mouth daily. Minna Merritts, MD Taking Active   apixaban (ELIQUIS) 5 MG TABS tablet [id number] Yes Take 1 tablet (5 mg total) by mouth 2 (two) times daily. Steele Sizer, MD Taking Active Self  atorvastatin (LIPITOR) 20 MG tablet [id number] Yes Take one daily Steele Sizer, MD Taking Active   busPIRone (BUSPAR) 5 MG tablet [id number] Yes TAKE 1 TABLET(5 MG) BY MOUTH TWICE DAILY Sowles, Drue Stager, MD Taking Active   cholecalciferol (VITAMIN D) 1000 UNITS tablet [id number] Yes Take 2,000 Units by mouth daily. [provider] Taking Active Self  doxazosin (CARDURA) 1 MG tablet [id number] Yes Take 1 tablet (1 mg total) by mouth 2 (two) times daily. Minna Merritts, MD Taking Active   ezetimibe (ZETIA) 10 MG tablet [id number] Yes TAKE 1 TABLET(10 MG) BY MOUTH DAILY Rockey Situ, Kathlene November, MD Taking Active   furosemide (LASIX) 20 MG tablet [id number] Yes TAKE 1 TABLET(20 MG) BY MOUTH DAILY Steele Sizer, MD Taking Active   levothyroxine (SYNTHROID, LEVOTHROID) 137 MCG tablet [id number] Yes TAKE 1 TABLET(137 MCG) BY MOUTH EVERY MORNING Steele Sizer, MD Taking  Active   metoprolol succinate (TOPROL-XL) 50 MG 24 hr tablet [id number] Yes TAKE 1 TABLET BY MOUTH TWICE DAILY WITH OR IMMEDIATELY FOLLOWING A MEAL Gollan, Kathlene November, MD Taking Active   Multiple Vitamin (MULITIVITAMIN WITH MINERALS) TABS [id number] Yes Take 1 tablet by mouth daily. [provider] Taking Active Self  potassium chloride (K-DUR) 10 MEQ tablet [id number] Yes TAKE 1 TABLET(10 MEQ) BY MOUTH DAILY Ancil Boozer, Drue Stager, MD Taking Active   temazepam  (RESTORIL) 15 MG capsule [id number] Yes TAKE 1 CAPSULE(15 MG) BY MOUTH AT BEDTIME AS NEEDED FOR SLEEP Sowles, Drue Stager, MD Taking Active   valsartan (DIOVAN) 320 MG tablet [id number] Yes TAKE 1 TABLET(320 MG) BY MOUTH DAILY Gollan, Kathlene November, MD Taking Active            Assessment:   Total Number of meds:  __________ (polypharmacy > 10 meds)  Indications for all medications: []  Yes       []  No  Intervention  YES NO  Explanation   Unnecessary drug therapy      Duplicate Therapy []  []     No indication []  []     Treating avoidable ADE []  []    Needs additional therapy      Medication requires monitoring []  []    Preventative therapy   []  []    Possible untreated condition   []  []    Synergistic therapy   []  []     Safety/Adverse Med Event      Contraindication present []  []     Unsafe medication []  []     Allergic reaction []  []     Drug interaction []  []     Excessive dose/duration []  []     Undesirable side effect []  []     Adherence      Cannot afford medication []  []     Cannot self-administer medication appropriately []  []     Does not understand directions []  []     Prefers not to take []  []     Product unavailable []  []     Forgets to take []  []     Dose/Frequency      Dose form inappropriate for patient []  []     Dosing consideration []  []     Different drug needed      Condition refractory to medication []  []     More effective medication available []  []     More cost-effective medication available []  []     Other pertinent pharmacist  counseling      Goals Addressed            This Visit's Progress   . I feel like I take a lot of pills (pt-stated)        Current Barriers:  . High pill burden   Pharmacist Clinical Goal(s): Over the next 30 days, [name] will be adherent to all medications as evidenced by patient report and pharmacy fill history.   Over the next 30 days, [name] and clinical pharmacist will work with Drs. Gollan and Sowles to decrease pill burden  without decreasing safety or efficacy of [name]'s care plan.   Interventions: . Patient educated on purpose, proper use and potential adverse effects of each medication. . Complete medication review . Contacted Dr. Rockey Situ: metoprolol succinate dose  . Note to Dr. Ancil Boozer: rosuvastatin to replace atorvastatin + ezetimibe   Patient Self Care Activities:  . Take all medications as prescribed  Initial goal documentation         Plan: Recommendations discussed with provider (Dr Rockey Situ, Dr. Ancil Boozer) - DC Zetia, atorvastatin -  Add rosuvastatin - Decrease dose of metoprolol succinate   Recommendations discussed with patient - Keep taking medications as prescribed  Follow up: Telephone follow up appointment with care management team member scheduled for: 30 days with PharmD  Ruben Reason, PharmD Clinical Pharmacist [facility] [phone]

## 2019-03-31 NOTE — Progress Notes (Signed)
Patient contacted Dr. Rockey Situ, I as switching from Atorvastatin to Rosuvastatin 40 mg, but we will wait for Dr. Rockey Situ to see if able to stop Zetia

## 2019-03-31 NOTE — Telephone Encounter (Signed)
[name], Clinical pharmacist at [facility], calling. Recently working with patient and found:  1- patient taking metoprolol succinate 50 mg tablet, taking 1/2 tablet by mouth two times a day because HR is almost always less than 50. States Dr Rockey Situ told her to take a 1/2 tablet if this was the case.  Would like to know if we can adjust to once a day dosing or go prescribe 25 mg tablet 2 times a day since that is how she is taking.  2- Patient expressed that she would like to be on less meds. [name] has discussed with Dr Ancil Boozer about possibly coming off the atorvastatin and starting Crestor(Rosuvastatin).  Would like to know if it would be advised by Dr Rockey Situ to make that switch and be able to discontinue zetia so patient will only be taking one pill of cholesterol.   Advised I will route to Dr Rockey Situ for advice.  [name]'s direct line is [phone] if we need to reach her directly.

## 2019-03-31 NOTE — Telephone Encounter (Signed)
Louanne Skye calling from Dr Ancil Boozer office Would like to discuss patient's medications Transferred to [facility]

## 2019-03-31 NOTE — Addendum Note (Signed)
Addended by: Steele Sizer F on: 03/31/2019 05:04 PM   Modules accepted: Orders

## 2019-04-01 NOTE — Telephone Encounter (Signed)
Certainly acceptable to make the changes mentioned Metoprolol succinate 50 mg daily Crestor instead of Lipitor/ Zetia if tolerated

## 2019-04-01 NOTE — Telephone Encounter (Signed)
Routing to pharmacist.  ?

## 2019-04-15 ENCOUNTER — Ambulatory Visit: Payer: Self-pay | Admitting: Pharmacist

## 2019-04-15 DIAGNOSIS — E785 Hyperlipidemia, unspecified: Secondary | ICD-10-CM

## 2019-04-15 DIAGNOSIS — I5042 Chronic combined systolic (congestive) and diastolic (congestive) heart failure: Secondary | ICD-10-CM | POA: Diagnosis not present

## 2019-04-15 DIAGNOSIS — N183 Chronic kidney disease, stage 3 (moderate): Secondary | ICD-10-CM | POA: Diagnosis not present

## 2019-04-15 NOTE — Patient Instructions (Signed)
1. Per Dr. Rockey Situ, take only ONE metoprolol succinate 50mg  every day. He did not indicate if morning or evening would be preferred, so you can take it in the morning. If your heart rate becomes too high as indicated by your home blood pressure cuff, please call Dr. Donivan Scull office.   2. Stop taking ezetimibe (or Zetia) and atorvastatin. These cholesterol medications will be replaced with one medication: rosuvastin 40mg . Take one tablet daily.    Thank you allowing the Chronic Care Management Team to be a part of your care!   Please call a member of the CCM (Chronic Care Management) Team with any questions or case management needs:   Vanetta Mulders, BSN Nurse Care Coordinator  [phone]  Ruben Reason, PharmD  Clinical Pharmacist  [phone]  Elliot Gurney, [place] Clinical Social Worker [phone]

## 2019-04-15 NOTE — Chronic Care Management (AMB) (Signed)
  Chronic Care Management   Follow Up Note   04/15/2019 Name: [name] MRN: [id number] DOB: 1941/09/29  Referred by: Steele Sizer, MD Reason for referral : Chronic Care Management (Follow up- cholesterol)   [name] is a 78 y.o. year old female who is a primary care patient of Steele Sizer, MD. The CCM clinical pharmacist followed up today regarding patient's stated goal of high pill burden and follow up from Dr. Ancil Boozer and Dr. Rockey Situ regarding pharmacist recommendation.   Review of patient status, including review of consultants reports, relevant laboratory and other test results, and collaboration with appropriate care team members and the patient's provider was performed as part of comprehensive patient evaluation and provision of chronic care management services.    Goals Addressed            This Visit's Progress   . I feel like I take a lot of pills (pt-stated)   On track     Current Barriers:  . High pill burden   Pharmacist Clinical Goal(s): Over the next 30 days, Ms. Zenker will be adherent to all medications as evidenced by patient report and pharmacy fill history.   Over the next 30 days, Ms. Greis and clinical pharmacist will work with Drs. Gollan and Sowles to decrease pill burden without decreasing safety or efficacy of Ms. Blacklock's care plan.   Interventions: . Patient educated on purpose, proper use and potential adverse effects of each medication. . Complete medication review . Contacted Dr. Rockey Situ: metoprolol succinate dose  o Updated 6/23: Dr. Rockey Situ approved metoprolol succinate 50mg  take one tablet ONCE daily . Note to Dr. Ancil Boozer: rosuvastatin to replace atorvastatin + ezetimibe  o Updated 6/23: Dr. Ancil Boozer prescrbribed rosuvastatin 40mg  daily  Patient Self Care Activities:  . Take all medications as prescribed  Please see past updates related to this goal by clicking on the "Past Updates" button in the selected goal          Follow  up: Telephone follow up appointment with care management team member scheduled for: PharmD in early July to discuss Eliquis medication assistance program   Ruben Reason, PharmD Clinical Pharmacist [place] [phone]

## 2019-04-30 ENCOUNTER — Telehealth: Payer: Self-pay

## 2019-05-06 ENCOUNTER — Ambulatory Visit (INDEPENDENT_AMBULATORY_CARE_PROVIDER_SITE_OTHER): Payer: Medicare Other | Admitting: Pharmacist

## 2019-05-06 DIAGNOSIS — I10 Essential (primary) hypertension: Secondary | ICD-10-CM

## 2019-05-06 DIAGNOSIS — I4891 Unspecified atrial fibrillation: Secondary | ICD-10-CM

## 2019-05-06 DIAGNOSIS — E785 Hyperlipidemia, unspecified: Secondary | ICD-10-CM | POA: Diagnosis not present

## 2019-05-06 NOTE — Chronic Care Management (AMB) (Signed)
  Chronic Care Management   Follow Up Note   05/06/2019 Name: [name] MRN: [id number] DOB: [birth year]  Referred by: Steele Sizer, MD Reason for referral : Chronic Care Management (1 month follow up) and Case Closure   [name] is a 78 y.o. year old female who is a primary care patient of Steele Sizer, MD. The CCM clinical pharmacist followed up today regarding patient's stated goal of high pill burden and follow up from Dr. Ancil Boozer and Dr. Rockey Situ regarding pharmacist recommendation.   Review of patient status, including review of consultants reports, relevant laboratory and other test results, and collaboration with appropriate care team members and the patient's provider was performed as part of comprehensive patient evaluation and provision of chronic care management services.    Goals Addressed            This Visit's Progress   . COMPLETED: I feel like I take a lot of pills (pt-stated)        Current Barriers:  . High pill burden   Pharmacist Clinical Goal(s): Over the next 30 days, Ms. Wearing will be adherent to all medications as evidenced by patient report and pharmacy fill history.   Over the next 30 days, Ms. Saks and clinical pharmacist will work with Drs. Gollan and Sowles to decrease pill burden without decreasing safety or efficacy of Ms. Dantes's care plan.   Interventions: . Patient educated on purpose, proper use and potential adverse effects of each medication. . Complete medication review . Contacted Dr. Rockey Situ: metoprolol succinate dose  o Updated 6/23: Dr. Rockey Situ approved metoprolol succinate 53m take one tablet ONCE daily . Note to Dr. SAncil Boozer rosuvastatin to replace atorvastatin + ezetimibe  o Updated 6/23: Dr. SAncil Boozerprescrbribed rosuvastatin 454mdaily  Patient Self Care Activities:  . Take all medications as prescribed  Please see past updates related to this goal by clicking on the "Past Updates" button in the selected goal           Follow up: Patient has met CCM clinical pharmacist goals. The patient has been provided with contact information for the care management team and has been advised to call with any health related questions or concerns.  - if she falls into Medicare gap and needs assistance with Eliquis.    Next PCP appointment scheduled for: June 02, 2019 [facility]  May 23, 2019   JuRuben ReasonPharmD Clinical Pharmacist [facility] [phone]

## 2019-05-06 NOTE — Patient Instructions (Signed)
Congratulations! You have met all case management goals! You may call the case management team at any time should you have a question or if you have new case management needs. We are happy to help you! We will let your doctor know that you have met your goals.    Thank you allowing the Chronic Care Management Team to be a part of your care!   Please call a member of the CCM (Chronic Care Management) Team with any questions or case management needs in the future:   Trish Fountain, RN, BSN Nurse Care Coordinator  [phone]  Ruben Reason, PharmD  Clinical Pharmacist  [phone]

## 2019-05-12 ENCOUNTER — Other Ambulatory Visit: Payer: Self-pay | Admitting: Family Medicine

## 2019-05-13 ENCOUNTER — Other Ambulatory Visit: Payer: Self-pay | Admitting: Family Medicine

## 2019-05-13 DIAGNOSIS — F411 Generalized anxiety disorder: Secondary | ICD-10-CM

## 2019-05-19 ENCOUNTER — Other Ambulatory Visit: Payer: Self-pay | Admitting: Family Medicine

## 2019-05-22 ENCOUNTER — Other Ambulatory Visit: Payer: Self-pay | Admitting: Family Medicine

## 2019-05-22 DIAGNOSIS — F5102 Adjustment insomnia: Secondary | ICD-10-CM

## 2019-05-23 ENCOUNTER — Ambulatory Visit (INDEPENDENT_AMBULATORY_CARE_PROVIDER_SITE_OTHER): Payer: Medicare Other

## 2019-05-23 VITALS — BP 149/66 | HR 50 | Temp 97.6°F | Ht 66.0 in | Wt 128.0 lb

## 2019-05-23 DIAGNOSIS — Z1231 Encounter for screening mammogram for malignant neoplasm of breast: Secondary | ICD-10-CM

## 2019-05-23 DIAGNOSIS — Z0000 Encounter for general adult medical examination without abnormal findings: Secondary | ICD-10-CM

## 2019-05-23 DIAGNOSIS — Z780 Asymptomatic menopausal state: Secondary | ICD-10-CM

## 2019-05-23 NOTE — Patient Instructions (Signed)
[name] , Thank you for taking time to come for your Medicare Wellness Visit. I appreciate your ongoing commitment to your health goals. Please review the following plan we discussed and let me know if I can assist you in the future.   Screening recommendations/referrals: Colonoscopy: done 2010. Please discuss Cologuard at next office visit with Dr. Ancil Boozer Mammogram: done 06/28/17. Please call [phone] to schedule your mammogram and bone density screening.   Bone Density: done 06/12/17 Recommended yearly ophthalmology/optometry visit for glaucoma screening and checkup Recommended yearly dental visit for hygiene and checkup  Vaccinations: Influenza vaccine: done 07/04/18 Pneumococcal vaccine: done 12/04/17 Tdap vaccine: due - please contact us if you get a cut or scrape Shingles vaccine: Shingrix discussed. Please contact your pharmacy for coverage information.   Advanced directives: Please bring a copy of your health care power of attorney and living will to the office at your convenience.  Conditions/risks identified: Keep up the great work!  Next appointment: Please follow up in one year for your Medicare Annual Wellness visit.     Preventive Care 33 Years and Older, Female Preventive care refers to lifestyle choices and visits with your health care provider that can promote health and wellness. What does preventive care include?  A yearly physical exam. This is also called an annual well check.  Dental exams once or twice a year.  Routine eye exams. Ask your health care provider how often you should have your eyes checked.  Personal lifestyle choices, including:  Daily care of your teeth and gums.  Regular physical activity.  Eating a healthy diet.  Avoiding tobacco and drug use.  Limiting alcohol use.  Practicing safe sex.  Taking low-dose aspirin every day.  Taking vitamin and mineral supplements as recommended by your health care provider. What happens during  an annual well check? The services and screenings done by your health care provider during your annual well check will depend on your age, overall health, lifestyle risk factors, and family history of disease. Counseling  Your health care provider may ask you questions about your:  Alcohol use.  Tobacco use.  Drug use.  Emotional well-being.  Home and relationship well-being.  Sexual activity.  Eating habits.  History of falls.  Memory and ability to understand (cognition).  Work and work Statistician.  Reproductive health. Screening  You may have the following tests or measurements:  Height, weight, and BMI.  Blood pressure.  Lipid and cholesterol levels. These may be checked every 5 years, or more frequently if you are over 62 years old.  Skin check.  Lung cancer screening. You may have this screening every year starting at age 56 if you have a 30-pack-year history of smoking and currently smoke or have quit within the past 15 years.  Fecal occult blood test (FOBT) of the stool. You may have this test every year starting at age 20.  Flexible sigmoidoscopy or colonoscopy. You may have a sigmoidoscopy every 5 years or a colonoscopy every 10 years starting at age 93.  Hepatitis C blood test.  Hepatitis B blood test.  Sexually transmitted disease (STD) testing.  Diabetes screening. This is done by checking your blood sugar (glucose) after you have not eaten for a while (fasting). You may have this done every 1-3 years.  Bone density scan. This is done to screen for osteoporosis. You may have this done starting at age 48.  Mammogram. This may be done every 1-2 years. Talk to your health care provider about  how often you should have regular mammograms. Talk with your health care provider about your test results, treatment options, and if necessary, the need for more tests. Vaccines  Your health care provider may recommend certain vaccines, such as:  Influenza  vaccine. This is recommended every year.  Tetanus, diphtheria, and acellular pertussis (Tdap, Td) vaccine. You may need a Td booster every 10 years.  Zoster vaccine. You may need this after age 45.  Pneumococcal 13-valent conjugate (PCV13) vaccine. One dose is recommended after age 72.  Pneumococcal polysaccharide (PPSV23) vaccine. One dose is recommended after age 62. Talk to your health care provider about which screenings and vaccines you need and how often you need them. This information is not intended to replace advice given to you by your health care provider. Make sure you discuss any questions you have with your health care provider. Document Released: 11/05/2015 Document Revised: 06/28/2016 Document Reviewed: 08/10/2015 Elsevier Interactive Patient Education  2017 [place] Prevention in the Home Falls can cause injuries. They can happen to people of all ages. There are many things you can do to make your home safe and to help prevent falls. What can I do on the outside of my home?  Regularly fix the edges of walkways and driveways and fix any cracks.  Remove anything that might make you trip as you walk through a door, such as a raised step or threshold.  Trim any bushes or trees on the path to your home.  Use bright outdoor lighting.  Clear any walking paths of anything that might make someone trip, such as rocks or tools.  Regularly check to see if handrails are loose or broken. Make sure that both sides of any steps have handrails.  Any raised decks and porches should have guardrails on the edges.  Have any leaves, snow, or ice cleared regularly.  Use sand or salt on walking paths during winter.  Clean up any spills in your garage right away. This includes oil or grease spills. What can I do in the bathroom?  Use night lights.  Install grab bars by the toilet and in the tub and shower. Do not use towel bars as grab bars.  Use non-skid mats or decals  in the tub or shower.  If you need to sit down in the shower, use a plastic, non-slip stool.  Keep the floor dry. Clean up any water that spills on the floor as soon as it happens.  Remove soap buildup in the tub or shower regularly.  Attach bath mats securely with double-sided non-slip rug tape.  Do not have throw rugs and other things on the floor that can make you trip. What can I do in the bedroom?  Use night lights.  Make sure that you have a light by your bed that is easy to reach.  Do not use any sheets or blankets that are too big for your bed. They should not hang down onto the floor.  Have a firm chair that has side arms. You can use this for support while you get dressed.  Do not have throw rugs and other things on the floor that can make you trip. What can I do in the kitchen?  Clean up any spills right away.  Avoid walking on wet floors.  Keep items that you use a lot in easy-to-reach places.  If you need to reach something above you, use a strong step stool that has a grab bar.  Keep  electrical cords out of the way.  Do not use floor polish or wax that makes floors slippery. If you must use wax, use non-skid floor wax.  Do not have throw rugs and other things on the floor that can make you trip. What can I do with my stairs?  Do not leave any items on the stairs.  Make sure that there are handrails on both sides of the stairs and use them. Fix handrails that are broken or loose. Make sure that handrails are as long as the stairways.  Check any carpeting to make sure that it is firmly attached to the stairs. Fix any carpet that is loose or worn.  Avoid having throw rugs at the top or bottom of the stairs. If you do have throw rugs, attach them to the floor with carpet tape.  Make sure that you have a light switch at the top of the stairs and the bottom of the stairs. If you do not have them, ask someone to add them for you. What else can I do to help  prevent falls?  Wear shoes that:  Do not have high heels.  Have rubber bottoms.  Are comfortable and fit you well.  Are closed at the toe. Do not wear sandals.  If you use a stepladder:  Make sure that it is fully opened. Do not climb a closed stepladder.  Make sure that both sides of the stepladder are locked into place.  Ask someone to hold it for you, if possible.  Clearly mark and make sure that you can see:  Any grab bars or handrails.  First and last steps.  Where the edge of each step is.  Use tools that help you move around (mobility aids) if they are needed. These include:  Canes.  Walkers.  Scooters.  Crutches.  Turn on the lights when you go into a dark area. Replace any light bulbs as soon as they burn out.  Set up your furniture so you have a clear path. Avoid moving your furniture around.  If any of your floors are uneven, fix them.  If there are any pets around you, be aware of where they are.  Review your medicines with your doctor. Some medicines can make you feel dizzy. This can increase your chance of falling. Ask your doctor what other things that you can do to help prevent falls. This information is not intended to replace advice given to you by your health care provider. Make sure you discuss any questions you have with your health care provider. Document Released: 08/05/2009 Document Revised: 03/16/2016 Document Reviewed: 11/13/2014 Elsevier Interactive Patient Education  2017 [employer].

## 2019-05-23 NOTE — Progress Notes (Signed)
Subjective:   [name] is a 78 y.o. female who presents for Medicare Annual (Subsequent) preventive examination.  Virtual Visit via Telephone Note  I connected with [name] on 05/23/19 at  9:40 AM EDT by telephone and verified that I am speaking with the correct person using two identifiers.  Medicare Annual Wellness visit completed telephonically due to Covid-19 pandemic.   Location: Patient: home Provider: office   I discussed the limitations, risks, security and privacy concerns of performing an evaluation and management service by telephone and the availability of in person appointments. The patient expressed understanding and agreed to proceed.  Some vital signs may be absent or patient reported.    Clemetine Marker, LPN  Review of Systems:   Cardiac Risk Factors include: advanced age (>31men, >14 women);dyslipidemia;hypertension     Objective:     Vitals: BP (!) 149/66   Pulse (!) 50   Temp 97.6 F (36.4 C)   Ht 5\' 6"  (1.676 m)   Wt 128 lb (58.1 kg)   BMI 20.66 kg/m   Body mass index is 20.66 kg/m.  Advanced Directives 05/23/2019 06/26/2018 06/26/2018 05/17/2018 04/24/2018 04/22/2018 03/25/2018  Does Patient Have a Medical Advance Directive? Yes Yes Yes Yes No No Yes  Type of Paramedic of [place];Living will Out of facility DNR (pink MOST or yellow form) Healthcare Power of [place];Living will - - Living will  Does patient want to make changes to medical advance directive? No - Patient declined No - Patient declined - - - - No - Patient declined  Copy of [place] in Chart? No - copy requested - - No - copy requested - - -  Would patient like information on creating a medical advance directive? - - - - No - Patient declined No - Patient declined -  Pre-existing out of facility DNR order (yellow form or pink MOST form) - Physician notified to receive inpatient order - - - - -    Tobacco  Social History   Tobacco Use  Smoking Status Never Smoker  Smokeless Tobacco Never Used  Tobacco Comment   smoking cessation materials not required     Counseling given: Not Answered Comment: smoking cessation materials not required   Clinical Intake:  Pre-visit preparation completed: Yes  Pain : 0-10 Pain Score: 6  Pain Type: Chronic pain Pain Location: Neck(back) Pain Descriptors / Indicators: Aching, Discomfort Pain Onset: More than a month ago Pain Frequency: Constant     BMI - recorded: 20.66 Nutritional Status: BMI of 19-24  Normal Nutritional Risks: None Diabetes: No  How often do you need to have someone help you when you read instructions, pamphlets, or other written materials from your doctor or pharmacy?: 1 - Never  Interpreter Needed?: No  Information entered by :: Clemetine Marker LPN  Past Medical History:  Diagnosis Date  . Anxiety   . Bronchitis   . Chronic combined systolic (congestive) and diastolic (congestive) heart failure ([place])    a. 02/2018 Echo: EF 45-50%, diff HK, triv AI, mod MR, mildly dil LA/RA, nl RV fxn, mod TR. PASP 40-5mmHg.  Marland Kitchen Chronic kidney disease   . DJD (degenerative joint disease), cervical   . History of stress test    a. 01/2008 MV: EF 76%. Fair ex tol. No ischemia.  . Hyperlipidemia   . Hypertension   . Hypothyroid   . LBBB (left bundle branch block)   . Leg pain  a. 01/2018 ABI's wnl.  . Persistent atrial fibrillation    a. 01/2018 Event monitor: 45 runs of SVT, longes 14.5 sec. SVT felt to be Afib/flutter-->2% burden. Longest run of AF 4h 47m (CHA2DS2VASc = 4-->Xarelto).  . 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 CARDIOVERSION N/A 03/26/2018   Procedure: CARDIOVERSION;  Surgeon: Nelva Bush, MD;  Location: [facility] ORS;  Service: Cardiovascular;  Laterality: N/A;  . CARDIOVERSION N/A 04/24/2018   Procedure:  CARDIOVERSION;  Surgeon: Minna Merritts, MD;  Location: [facility] ORS;  Service: Cardiovascular;  Laterality: N/A;  . ELECTROPHYSIOLOGY STUDY N/A 02/27/2012   Procedure: ELECTROPHYSIOLOGY STUDY;  Surgeon: Evans Lance, MD;  Location: [facility] CATH LAB;  Service: Cardiovascular;  Laterality: N/A;  . EPS and ablation for SVT  5/13   slow pathway ablation by Dr Lovena Le  . EYE SURGERY     surgery for detatched retina  . SUPRAVENTRICULAR TACHYCARDIA ABLATION N/A 02/27/2012   Procedure: SUPRAVENTRICULAR TACHYCARDIA ABLATION;  Surgeon: Evans Lance, MD;  Location: [facility] CATH LAB;  Service: Cardiovascular;  Laterality: N/A;   Family History  Problem Relation Age of Onset  . Alzheimer's disease Mother   . Stroke Father   . Breast cancer Father   . Heart disease Father        Pacemaker  . Breast cancer Paternal Aunt   . Kidney cancer Maternal Grandmother   . Alzheimer's disease Maternal Grandfather   . Thyroid disease Paternal Grandmother   . Stroke Paternal Grandfather    Social History   Socioeconomic History  . Marital status: Widowed    Spouse name: Not on file  . Number of children: 1  . Years of education: Not on file  . Highest education level: 12th grade  Occupational History  . Occupation: Retired  Scientific laboratory technician  . Financial resource strain: Not hard at all  . Food insecurity    Worry: Never true    Inability: Never true  . Transportation needs    Medical: No    Non-medical: No  Tobacco Use  . Smoking status: Never Smoker  . Smokeless tobacco: Never Used  . Tobacco comment: smoking cessation materials not required  Substance and Sexual Activity  . Alcohol use: No  . Drug use: No  . Sexual activity: Not Currently    Partners: Male  Lifestyle  . Physical activity    Days per week: 6 days    Minutes per session: 30 min  . Stress: Only a little  Relationships  . Social connections    Talks on phone: More than three times a week    Gets together: More than three times a week     Attends religious service: More than 4 times per year    Active member of club or organization: Yes    Attends meetings of clubs or organizations: More than 4 times per year    Relationship status: Widowed  Other Topics Concern  . Not on file  Social History Narrative   Lives in [place], she has a boyfriend    Outpatient Encounter Medications as of 05/23/2019  Medication Sig  . acetaminophen (TYLENOL) 650 MG CR tablet Take 1,300 mg by mouth every 8 (eight) hours as needed for pain.  Marland Kitchen amiodarone (PACERONE) 200 MG tablet Take 1 tablet (200 mg total) by mouth daily.  . busPIRone (BUSPAR) 5 MG tablet TAKE 1 TABLET BY  MOUTH TWICE DAILY  . cholecalciferol (VITAMIN D) 1000 UNITS tablet Take 2,000 Units by mouth daily.  Marland Kitchen doxazosin (CARDURA) 1 MG tablet Take 1 tablet (1 mg total) by mouth 2 (two) times daily.  Marland Kitchen ELIQUIS 5 MG TABS tablet TAKE 1 TABLET(5 MG) BY MOUTH TWICE DAILY  . furosemide (LASIX) 20 MG tablet TAKE 1 TABLET(20 MG) BY MOUTH DAILY  . levothyroxine (SYNTHROID) 137 MCG tablet TAKE 1 TABLET(137 MCG) BY MOUTH EVERY MORNING  . metoprolol succinate (TOPROL-XL) 50 MG 24 hr tablet TAKE 1 TABLET BY MOUTH TWICE DAILY WITH OR IMMEDIATELY FOLLOWING A MEAL  . Multiple Vitamin (MULITIVITAMIN WITH MINERALS) TABS Take 1 tablet by mouth daily.  . potassium chloride (K-DUR) 10 MEQ tablet TAKE 1 TABLET(10 MEQ) BY MOUTH DAILY  . rosuvastatin (CRESTOR) 40 MG tablet Take 1 tablet (40 mg total) by mouth daily.  . temazepam (RESTORIL) 15 MG capsule TAKE 1 CAPSULE(15 MG) BY MOUTH AT BEDTIME AS NEEDED FOR SLEEP  . valsartan (DIOVAN) 320 MG tablet TAKE 1 TABLET(320 MG) BY MOUTH DAILY   No facility-administered encounter medications on file as of 05/23/2019.     Activities of Daily Living In your present state of health, do you have any difficulty performing the following activities: 05/23/2019 12/31/2018  Hearing? N N  Comment declines hearing aids -  Vision? Tempie Donning  Comment wears contacts -   Difficulty concentrating or making decisions? N N  Walking or climbing stairs? N N  Dressing or bathing? N N  Doing errands, shopping? N N  Preparing Food and eating ? N -  Using the Toilet? N -  In the past six months, have you accidently leaked urine? N -  Do you have problems with loss of bowel control? N -  Managing your Medications? N -  Managing your Finances? N -  Housekeeping or managing your Housekeeping? N -  Some recent data might be hidden    Patient Care Team: Steele Sizer, MD as PCP - General (Family Medicine) Minna Merritts, MD as PCP - Cardiology (Cardiology) Romine, Richard Miu, DO as Consulting Physician (Optometry) Dingeldein, Remo Lipps, MD as Consulting Physician (Ophthalmology) Alisa Graff, FNP as Consulting Physician (Family Medicine) Cathi Roan, [facility] (Pharmacist)    Assessment:   This is a routine wellness examination for Anarie.  Exercise Activities and Dietary recommendations Current Exercise Habits: Home exercise routine, Type of exercise: walking, Time (Minutes): 30, Frequency (Times/Week): 6, Weekly Exercise (Minutes/Week): 180, Intensity: Mild, Exercise limited by: cardiac condition(s)  Goals    . Exercise 150 min/wk Moderate Activity     Recommend to exercise for at least 150 minutes per week.    . Increase water intake     Recommend increasing water intake to 5-6 glasses a day.       Fall Risk Fall Risk  05/23/2019 01/31/2019 12/31/2018 09/17/2018 07/02/2018  Falls in the past year? 0 0 0 0 No  Number falls in past yr: 0 0 0 - -  Injury with Fall? 0 0 0 - -  Comment - - - - -  Risk for fall due to : Impaired vision - - - -  Risk for fall due to: Comment - - - - -  Follow up Falls prevention discussed - - - -   FALL RISK PREVENTION PERTAINING TO THE HOME:  Any stairs in or around the home? Yes  If so, do they handrails? Yes   Home free of loose throw rugs in walkways, pet  beds, electrical cords, etc? Yes  Adequate lighting in your  home to reduce risk of falls? Yes   ASSISTIVE DEVICES UTILIZED TO PREVENT FALLS:  Life alert? No  Use of a cane, walker or w/c? No  Grab bars in the bathroom? Yes  Shower chair or bench in shower? No  Elevated toilet seat or a handicapped toilet? Yes   DME ORDERS:  DME order needed?  No   TIMED UP AND GO:  Was the test performed? No . Telephonic visit.  Education: Fall risk prevention has been discussed.  Intervention(s) required? No   Depression Screen PHQ 2/9 Scores 05/23/2019 01/31/2019 07/02/2018 05/24/2018  PHQ - 2 Score 0 0 1 0  PHQ- 9 Score - 3 - -     Cognitive Function     6CIT Screen 05/23/2019 05/17/2018 03/12/2017  What Year? 0 points 0 points 0 points  What month? 0 points 0 points 0 points  What time? 0 points 0 points 0 points  Count back from 20 0 points 0 points 0 points  Months in reverse 0 points 0 points 0 points  Repeat phrase 0 points 0 points 0 points  Total Score 0 0 0    Immunization History  Administered Date(s) Administered  . Influenza, High Dose Seasonal PF 07/22/2015, 07/04/2018  . Influenza-Unspecified 08/25/2016, 08/13/2017  . Pneumococcal Conjugate-13 09/07/2016  . Pneumococcal Polysaccharide-23 12/04/2017    Qualifies for Shingles Vaccine? Yes . Due for Shingrix. Education has been provided regarding the importance of this vaccine. Pt has been advised to call insurance company to determine out of pocket expense. Advised may also receive vaccine at local pharmacy or Health Dept. Verbalized acceptance and understanding.  Tdap: Although this vaccine is not a covered service during a Wellness Exam, does the patient still wish to receive this vaccine today?  No .  Education has been provided regarding the importance of this vaccine. Advised may receive this vaccine at local pharmacy or Health Dept. Aware to provide a copy of the vaccination record if obtained from local pharmacy or Health Dept. Verbalized acceptance and understanding.  Flu  Vaccine: Up to date  Pneumococcal Vaccine: Up to date    Screening Tests Health Maintenance  Topic Date Due  . TETANUS/TDAP  10/23/2017  . INFLUENZA VACCINE  05/24/2019  . DEXA SCAN  Completed  . PNA vac Low Risk Adult  Completed   Cancer Screenings:  Colorectal Screening: Completed 2010. Repeat every 10 years; Due to patient's age she would like to discuss Cologuard at appt with Dr. Ancil Boozer on 06/02/19.  Mammogram: Completed 06/28/17. Repeat every year. Ordered today. Pt provided with contact information and advised to call to schedule appt.   Bone Density: Completed 06/12/17. Results reflect NORMAL. Repeat every 2 years. Ordered today. Pt provided with contact information and advised to call to schedule appt.   Lung Cancer Screening: (Low Dose CT Chest recommended if Age 61-80 years, 30 pack-year currently smoking OR have quit w/in 15years.) does not qualify.   Additional Screening:  Hepatitis C Screening: no longer required  Vision Screening: Recommended annual ophthalmology exams for early detection of glaucoma and other disorders of the eye. Is the patient up to date with their annual eye exam?  Yes  Who is the provider or what is the name of the office in which the pt attends annual eye exams? [place] Screening: Recommended annual dental exams for proper oral hygiene  Community Resource Referral:  CRR required this visit?  No      Plan:     I have personally reviewed and addressed the Medicare Annual Wellness questionnaire and have noted the following in the patient's chart:  A. Medical and social history B. Use of alcohol, tobacco or illicit drugs  C. Current medications and supplements D. Functional ability and status E.  Nutritional status F.  Physical activity G. Advance directives H. List of other physicians I.  Hospitalizations, surgeries, and ER visits in previous 12 months J.  [place] such as hearing and vision if needed,  cognitive and depression L. Referrals and appointments   In addition, I have reviewed and discussed with patient certain preventive protocols, quality metrics, and best practice recommendations. A written personalized care plan for preventive services as well as general preventive health recommendations were provided to patient.   Signed,  Clemetine Marker, LPN Nurse Health Advisor   Nurse Notes: pt doing well and appreciative of visit today

## 2019-06-02 ENCOUNTER — Encounter: Payer: Self-pay | Admitting: Family Medicine

## 2019-06-02 ENCOUNTER — Ambulatory Visit (INDEPENDENT_AMBULATORY_CARE_PROVIDER_SITE_OTHER): Payer: Medicare Other | Admitting: Family Medicine

## 2019-06-02 ENCOUNTER — Other Ambulatory Visit: Payer: Self-pay

## 2019-06-02 VITALS — BP 150/68 | HR 97 | Temp 97.3°F | Resp 16 | Ht 66.0 in | Wt 125.4 lb

## 2019-06-02 DIAGNOSIS — E039 Hypothyroidism, unspecified: Secondary | ICD-10-CM

## 2019-06-02 DIAGNOSIS — I4891 Unspecified atrial fibrillation: Secondary | ICD-10-CM | POA: Diagnosis not present

## 2019-06-02 DIAGNOSIS — I471 Supraventricular tachycardia, unspecified: Secondary | ICD-10-CM

## 2019-06-02 DIAGNOSIS — D692 Other nonthrombocytopenic purpura: Secondary | ICD-10-CM | POA: Diagnosis not present

## 2019-06-02 DIAGNOSIS — N183 Chronic kidney disease, stage 3 unspecified: Secondary | ICD-10-CM

## 2019-06-02 DIAGNOSIS — E785 Hyperlipidemia, unspecified: Secondary | ICD-10-CM

## 2019-06-02 DIAGNOSIS — R739 Hyperglycemia, unspecified: Secondary | ICD-10-CM | POA: Diagnosis not present

## 2019-06-02 DIAGNOSIS — F411 Generalized anxiety disorder: Secondary | ICD-10-CM

## 2019-06-02 DIAGNOSIS — I10 Essential (primary) hypertension: Secondary | ICD-10-CM

## 2019-06-02 DIAGNOSIS — I272 Pulmonary hypertension, unspecified: Secondary | ICD-10-CM | POA: Diagnosis not present

## 2019-06-02 DIAGNOSIS — G8929 Other chronic pain: Secondary | ICD-10-CM

## 2019-06-02 DIAGNOSIS — M542 Cervicalgia: Secondary | ICD-10-CM

## 2019-06-02 MED ORDER — APIXABAN 5 MG PO TABS: 5.0000 mg | ORAL_TABLET | Freq: Two times a day (BID) | ORAL | 1 refills | Status: DC

## 2019-06-02 MED ORDER — BUSPIRONE HCL 5 MG PO TABS: 5.0000 mg | ORAL_TABLET | Freq: Two times a day (BID) | ORAL | 0 refills | Status: DC

## 2019-06-02 MED ORDER — METOPROLOL SUCCINATE ER 50 MG PO TB24: ORAL_TABLET | ORAL | 0 refills | Status: DC

## 2019-06-02 MED ORDER — ROSUVASTATIN CALCIUM 40 MG PO TABS: 40.0000 mg | ORAL_TABLET | Freq: Every day | ORAL | 0 refills | Status: DC

## 2019-06-02 NOTE — Progress Notes (Signed)
Name: [name]   MRN: [id number]    DOB: 1941-05-10   Date:06/02/2019       Progress Note  Subjective  Chief Complaint  Chief Complaint  Patient presents with  . Hypertension  . Chronic Kidney Disease  . Dyslipidemia    HPI   Carotid calcification: found on x-ray of neck. She also had a vascular study done back in 2011 that showed mild plaque formationon right.Sheis now on Crestor and off Zetia, also on  Eliquis. Off aspirin since ablation 04/2018. She denies headache or dizziness, she states she has balance problems - but states secondary to visual disturbance ( she can see better from left eye than right). She went back to Dr. Erven Colla and had ABI that was normal, carotid doppler unchanged and will go back in 2021 .   Cervical radiculitis:she is now going to [facility] under the care of Dr. Larose Hires, also had injections done by Dr. Orpah Melter, last visit Fall 2019, she denies any tingling or numbness at this time, no weakness, but has chronic neck pain, pain right now is 6/10 aching like , does not affect her sleep.   HTN: bp at homeis usually controlled , 115-140's, usually elevated at our office, also at cardiologist visits. Discussed importance of bringing her bp cuff during her next visit   Afib: she had a cardioversion in June and another one in July 2019 ,she has intermittent palpitation at most once or twice a week,  but no chest pain associated with symptoms and it is very brief when it happens, rate controlled with amiodarone and metoprolol, but down to one pill daily because it was causing bradycarida.Compliant with medications, no side effects  CHF chronic: she is down to one pillow at night.She has mild sob with moderate activity - like when doing yard work, no chest pain, denies lower extremity edema.On ARB , beta blocker and Eliquis   CKI: last GFR improved up to 56,  good urine output , no pruritis. On ARB, no side effects We will recheck labs today    GAD: doing well on Buspar twice daily, she also takes temazepam qhs prn and it helps her sleep, she states taking Temazepam at most once a week. Stable at this time  Hypothyroidism: doing well on medication, denies hair loss, denies constipation or difficulty swallowing, we will recheck labs today    Patient Active Problem List   Diagnosis Date Noted  . Chronic heart failure ([place]) 05/24/2018  . Atrial fibrillation ([place]) 05/24/2018  . CKD (chronic kidney disease), stage III ([place]) 05/17/2018  . 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 Shortness of breath 05/07/2018  . Pulmonary hypertension, unspecified ([place]) 04/05/2018  . Chronic combined systolic (congestive) and diastolic (congestive) heart failure ([place]) 04/05/2018  . Paroxysmal sinus tachycardia ([place]) 01/29/2018  . Leg pain 01/04/2018  . PAD (peripheral artery disease) ([place]) 01/04/2018  . Carotid artery calcification, bilateral 12/19/2017  . Cervical radiculitis 12/19/2017  . Hyperglycemia 03/06/2016  . Hypothyroid 04/21/2015  . 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 Anxiety 04/21/2015  . Hyperlipidemia 04/21/2015  . Diuretic-induced hypokalemia 04/21/2015  . Palpitations 12/18/2012  . Hypertension 05/27/2012    Past Surgical History:  Procedure Laterality Date  . CARDIOVERSION N/A 03/26/2018   Procedure: CARDIOVERSION;  Surgeon: Nelva Bush, MD;  Location: [facility] ORS;  Service: Cardiovascular;  Laterality: N/A;  . CARDIOVERSION N/A 04/24/2018   Procedure: CARDIOVERSION;  Surgeon: Minna Merritts, MD;  Location: [facility] ORS;  Service: Cardiovascular;  Laterality: N/A;  . ELECTROPHYSIOLOGY STUDY N/A 02/27/2012   Procedure: ELECTROPHYSIOLOGY STUDY;  Surgeon: Evans Lance, MD;  Location: [facility] CATH LAB;  Service: Cardiovascular;  Laterality: N/A;  . EPS and ablation for SVT  5/13   slow pathway ablation by Dr Lovena Le  . EYE SURGERY     surgery for detatched retina  . SUPRAVENTRICULAR TACHYCARDIA  ABLATION N/A 02/27/2012   Procedure: SUPRAVENTRICULAR TACHYCARDIA ABLATION;  Surgeon: Evans Lance, MD;  Location: [facility] CATH LAB;  Service: Cardiovascular;  Laterality: N/A;    Family History  Problem Relation Age of Onset  . Alzheimer's disease Mother   . Stroke Father   . Breast cancer Father   . Heart disease Father        Pacemaker  . Breast cancer Paternal Aunt   . Kidney cancer Maternal Grandmother   . Alzheimer's disease Maternal Grandfather   . Thyroid disease Paternal Grandmother   . Stroke Paternal Grandfather     Social History   Socioeconomic History  . Marital status: Widowed    Spouse name: Not on file  . Number of children: 1  . Years of education: Not on file  . Highest education level: 12th grade  Occupational History  . Occupation: Retired  Scientific laboratory technician  . Financial resource strain: Not hard at all  . Food insecurity    Worry: Never true    Inability: Never true  . Transportation needs    Medical: No    Non-medical: No  Tobacco Use  . Smoking status: Never Smoker  . Smokeless tobacco: Never Used  . Tobacco comment: smoking cessation materials not required  Substance and Sexual Activity  . Alcohol use: No  . Drug use: No  . Sexual activity: Not Currently    Partners: Male  Lifestyle  . Physical activity    Days per week: 6 days    Minutes per session: 30 min  . Stress: Only a little  Relationships  . Social connections    Talks on phone: More than three times a week    Gets together: More than three times a week    Attends religious service: More than 4 times per year    Active member of club or organization: Yes    Attends meetings of clubs or organizations: More than 4 times per year    Relationship status: Widowed  . Intimate partner violence    Fear of current or ex partner: No    Emotionally abused: No    Physically abused: No    Forced sexual activity: No  Other Topics Concern  . Not on file  Social History Narrative   Lives in  [place], she has a boyfriend   She has cats, some inside, some outside      Current Outpatient Medications:  .  acetaminophen (TYLENOL) 650 MG CR tablet, Take 1,300 mg by mouth every 8 (eight) hours as needed for pain., Disp: , Rfl:  .  amiodarone (PACERONE) 200 MG tablet, Take 1 tablet (200 mg total) by mouth daily., Disp: 90 tablet, Rfl: 3 .  apixaban (ELIQUIS) 5 MG TABS tablet, Take 1 tablet (5 mg total) by mouth 2 (two) times daily., Disp: 180 tablet, Rfl: 1 .  busPIRone (BUSPAR) 5 MG tablet, Take 1 tablet (5 mg total) by mouth 2 (two) times daily., Disp: 180 tablet, Rfl: 0 .  cholecalciferol (VITAMIN D) 1000 UNITS tablet, Take 2,000 Units by mouth daily., Disp: , Rfl:  .  doxazosin (CARDURA) 1  MG tablet, Take 1 tablet (1 mg total) by mouth 2 (two) times daily., Disp: 180 tablet, Rfl: 3 .  furosemide (LASIX) 20 MG tablet, TAKE 1 TABLET(20 MG) BY MOUTH DAILY, Disp: 90 tablet, Rfl: 1 .  levothyroxine (SYNTHROID) 137 MCG tablet, TAKE 1 TABLET(137 MCG) BY MOUTH EVERY MORNING, Disp: 90 tablet, Rfl: 0 .  metoprolol succinate (TOPROL-XL) 50 MG 24 hr tablet, Take with or immediately following a meal., Disp: 90 tablet, Rfl: 0 .  Multiple Vitamin (MULITIVITAMIN WITH MINERALS) TABS, Take 1 tablet by mouth daily., Disp: , Rfl:  .  potassium chloride (K-DUR) 10 MEQ tablet, TAKE 1 TABLET(10 MEQ) BY MOUTH DAILY, Disp: 90 tablet, Rfl: 1 .  rosuvastatin (CRESTOR) 40 MG tablet, Take 1 tablet (40 mg total) by mouth daily., Disp: 90 tablet, Rfl: 0 .  temazepam (RESTORIL) 15 MG capsule, TAKE 1 CAPSULE(15 MG) BY MOUTH AT BEDTIME AS NEEDED FOR SLEEP, Disp: 30 capsule, Rfl: 0 .  valsartan (DIOVAN) 320 MG tablet, TAKE 1 TABLET(320 MG) BY MOUTH DAILY, Disp: 90 tablet, Rfl: 3  Allergies  Allergen Reactions  . Codeine Nausea And Vomiting  . Erythromycin Itching and Swelling  . Septra [Sulfamethoxazole-Trimethoprim] Swelling  . Penicillins Swelling and Rash    Has patient had a PCN reaction causing immediate  rash, facial/tongue/throat swelling, SOB or lightheadedness with hypotension: Yes Has patient had a PCN reaction causing severe rash involving mucus membranes or skin necrosis: No Has patient had a PCN reaction that required hospitalization: No Has patient had a PCN reaction occurring within the last 10 years: No If all of the above answers are "NO", then may proceed with Cephalosporin use.     I personally reviewed active problem list, medication list, allergies with the patient/caregiver today.   ROS  Constitutional: Negative for fever or weight change.  Respiratory: Negative for cough and shortness of breath.   Cardiovascular: Negative for chest pain or palpitations.  Gastrointestinal: Negative for abdominal pain, no bowel changes.  Musculoskeletal: Negative for gait problem or joint swelling.  Skin: Negative for rash.  Neurological: Negative for dizziness or headache.  No other specific complaints in a complete review of systems (except as listed in HPI above).  Objective  Vitals:   06/02/19 0757  BP: (!) 150/68  Pulse: 97  Resp: 16  Temp: (!) 97.3 F (36.3 C)  TempSrc: Temporal  SpO2: 97%  Weight: 125 lb 6.4 oz (56.9 kg)  Height: 5\' 6"  (1.676 m)    Body mass index is 20.24 kg/m.  Physical Exam  Constitutional: Patient appears well-developed and well-nourished. No distress.  HEENT: head atraumatic, normocephalic, pupils equal and reactive to light,  neck decrease in rom  Cardiovascular: Normal rate, regular rhythm and normal heart sounds.  No murmur heard. No BLE edema. Pulmonary/Chest: Effort normal and breath sounds normal. No respiratory distress. Abdominal: Soft.  There is no tenderness. Psychiatric: Patient has a normal mood and affect. behavior is normal. Judgment and thought content normal.  PHQ2/9: Depression screen [facility] 2/9 06/02/2019 05/23/2019 01/31/2019 07/02/2018 05/24/2018  Decreased Interest 0 0 0 0 0  Down, Depressed, Hopeless 0 0 - 1 0  PHQ - 2 Score  0 0 0 1 0  Altered sleeping 0 - 1 - -  Tired, decreased energy 0 - 1 - -  Change in appetite 0 - 0 - -  Feeling bad or failure about yourself  0 - 0 - -  Trouble concentrating 0 - 1 - -  Moving slowly or fidgety/restless  0 - 0 - -  Suicidal thoughts 0 - 0 - -  PHQ-9 Score 0 - 3 - -  Difficult doing work/chores - - Not difficult at all - -    phq 9 is negative   Fall Risk: Fall Risk  06/02/2019 05/23/2019 01/31/2019 12/31/2018 09/17/2018  Falls in the past year? 0 0 0 0 0  Number falls in past yr: 0 0 0 0 -  Injury with Fall? 0 0 0 0 -  Comment - - - - -  Risk for fall due to : - Impaired vision - - -  Risk for fall due to: Comment - - - - -  Follow up - Falls prevention discussed - - -     Functional Status Survey: Is the patient deaf or have difficulty hearing?: No Does the patient have difficulty seeing, even when wearing glasses/contacts?: No Does the patient have difficulty concentrating, remembering, or making decisions?: No Does the patient have difficulty walking or climbing stairs?: No Does the patient have difficulty dressing or bathing?: No Does the patient have difficulty doing errands alone such as visiting a doctor's office or shopping?: No    Assessment & Plan  1. Dyslipidemia  - Lipid panel - rosuvastatin (CRESTOR) 40 MG tablet; Take 1 tablet (40 mg total) by mouth daily.  Dispense: 90 tablet; Refill: 0  2. CKD (chronic kidney disease), stage III (HCC)  - CBC with Differential/Platelet - COMPLETE METABOLIC PANEL WITH GFR  3. Pulmonary hypertension ([place])   4. Senile purpura (HCC)  stable  5. Atrial fibrillation, unspecified type (HCC)  - apixaban (ELIQUIS) 5 MG TABS tablet; Take 1 tablet (5 mg total) by mouth 2 (two) times daily.  Dispense: 180 tablet; Refill: 1  6. Hypothyroidism, unspecified type  - TSH  7. Essential hypertension  BP elevated   8. SVT (supraventricular tachycardia) (HCC)  Resolved, on medication   9. Chronic neck  pain  She will call Dr. Larose Hires   10. Hyperglycemia  - Hemoglobin A1c  11. GAD (generalized anxiety disorder)  - busPIRone (BUSPAR) 5 MG tablet; Take 1 tablet (5 mg total) by mouth 2 (two) times daily.  Dispense: 180 tablet; Refill: 0

## 2019-06-03 LAB — CBC WITH DIFFERENTIAL/PLATELET
Absolute Monocytes: 481 cells/uL (ref 200–950)
Basophils Absolute: 30 cells/uL (ref 0–200)
Basophils Relative: 0.4 %
Eosinophils Absolute: 81 cells/uL (ref 15–500)
Eosinophils Relative: 1.1 %
HCT: 39.3 % (ref 35.0–45.0)
Hemoglobin: 13.2 g/dL (ref 11.7–15.5)
Lymphs Abs: 1095 cells/uL (ref 850–3900)
MCH: 32.7 pg (ref 27.0–33.0)
MCHC: 33.6 g/dL (ref 32.0–36.0)
MCV: 97.3 fL (ref 80.0–100.0)
MPV: 9.7 fL (ref 7.5–12.5)
Monocytes Relative: 6.5 %
Neutro Abs: 5713 cells/uL (ref 1500–7800)
Neutrophils Relative %: 77.2 %
Platelets: 206 10*3/uL (ref 140–400)
RBC: 4.04 10*6/uL (ref 3.80–5.10)
RDW: 12.7 % (ref 11.0–15.0)
Total Lymphocyte: 14.8 %
WBC: 7.4 10*3/uL (ref 3.8–10.8)

## 2019-06-03 LAB — COMPLETE METABOLIC PANEL WITH GFR
AG Ratio: 2 (calc) (ref 1.0–2.5)
ALT: 21 U/L (ref 6–29)
AST: 23 U/L (ref 10–35)
Albumin: 4.7 g/dL (ref 3.6–5.1)
Alkaline phosphatase (APISO): 64 U/L (ref 37–153)
BUN: 15 mg/dL (ref 7–25)
CO2: 30 mmol/L (ref 20–32)
Calcium: 9.6 mg/dL (ref 8.6–10.4)
Chloride: 103 mmol/L (ref 98–110)
Creat: 0.93 mg/dL (ref 0.60–0.93)
GFR, Est African American: 69 mL/min/{1.73_m2} (ref >=60)
GFR, Est Non African American: 60 mL/min/{1.73_m2} (ref >=60)
Globulin: 2.4 g/dL (calc) (ref 1.9–3.7)
Glucose, Bld: 111 mg/dL — ABNORMAL HIGH (ref 65–99)
Potassium: 4.2 mmol/L (ref 3.5–5.3)
Sodium: 140 mmol/L (ref 135–146)
Total Bilirubin: 0.7 mg/dL (ref 0.2–1.2)
Total Protein: 7.1 g/dL (ref 6.1–8.1)

## 2019-06-03 LAB — LIPID PANEL
Cholesterol: 128 mg/dL (ref <200)
HDL: 57 mg/dL (ref >=50)
LDL Cholesterol (Calc): 54 mg/dL (calc)
Non-HDL Cholesterol (Calc): 71 mg/dL (calc) (ref <130)
Total CHOL/HDL Ratio: 2.2 (calc) (ref <5.0)
Triglycerides: 86 mg/dL (ref <150)

## 2019-06-03 LAB — HEMOGLOBIN A1C
Hgb A1c MFr Bld: 5.2 % of total Hgb (ref <5.7)
Mean Plasma Glucose: 103 (calc)
eAG (mmol/L): 5.7 (calc)

## 2019-06-03 LAB — TSH: TSH: 1.2 mIU/L (ref 0.40–4.50)

## 2019-06-10 ENCOUNTER — Other Ambulatory Visit: Payer: Self-pay | Admitting: Family Medicine

## 2019-06-10 DIAGNOSIS — E785 Hyperlipidemia, unspecified: Secondary | ICD-10-CM

## 2019-06-10 DIAGNOSIS — E039 Hypothyroidism, unspecified: Secondary | ICD-10-CM

## 2019-06-11 ENCOUNTER — Other Ambulatory Visit: Payer: Self-pay | Admitting: Family Medicine

## 2019-06-11 DIAGNOSIS — I4891 Unspecified atrial fibrillation: Secondary | ICD-10-CM

## 2019-06-11 NOTE — Telephone Encounter (Signed)
Refill request for general medication. Eliquis   Last office visit 06/02/2019   Follow up on 12/03/2019

## 2019-06-25 DIAGNOSIS — L570 Actinic keratosis: Secondary | ICD-10-CM | POA: Diagnosis not present

## 2019-06-25 DIAGNOSIS — L578 Other skin changes due to chronic exposure to nonionizing radiation: Secondary | ICD-10-CM | POA: Diagnosis not present

## 2019-06-25 DIAGNOSIS — C44619 Basal cell carcinoma of skin of left upper limb, including shoulder: Secondary | ICD-10-CM | POA: Diagnosis not present

## 2019-06-25 DIAGNOSIS — Z85828 Personal history of other malignant neoplasm of skin: Secondary | ICD-10-CM | POA: Diagnosis not present

## 2019-06-25 DIAGNOSIS — D485 Neoplasm of uncertain behavior of skin: Secondary | ICD-10-CM | POA: Diagnosis not present

## 2019-06-25 DIAGNOSIS — C44712 Basal cell carcinoma of skin of right lower limb, including hip: Secondary | ICD-10-CM | POA: Diagnosis not present

## 2019-06-27 NOTE — Progress Notes (Deleted)
Patient ID: [name], female    DOB: 1941/02/11, 78 y.o.   MRN: [id number]  HPI  Ms Singhal is a 78 y/o female with a history of atrial fibrillation, hyperlipidemia, HTN, thyroid disease, SVT, DJD and chronic heart failure.   Echo report from 03/21/18 reviewed and showed an EF of 45-50% along with moderate MR.   Has not been admitted or been in the ED in the last 6 months.    She presents today for a follow-up visit with a chief complaint of   Past Medical History:  Diagnosis Date  . Anxiety   . Bronchitis   . Chronic combined systolic (congestive) and diastolic (congestive) heart failure ([place])    a. 02/2018 Echo: EF 45-50%, diff HK, triv AI, mod MR, mildly dil LA/RA, nl RV fxn, mod TR. PASP 40-66mmHg.  Marland Kitchen Chronic kidney disease   . DJD (degenerative joint disease), cervical   . History of stress test    a. 01/2008 MV: EF 76%. Fair ex tol. No ischemia.  . Hyperlipidemia   . Hypertension   . Hypothyroid   . LBBB (left bundle branch block)   . Leg pain    a. 01/2018 ABI's wnl.  . Persistent atrial fibrillation    a. 01/2018 Event monitor: 45 runs of SVT, longes 14.5 sec. SVT felt to be Afib/flutter-->2% burden. Longest run of AF 4h 70m (CHA2DS2VASc = 4-->Xarelto).  . 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 CARDIOVERSION N/A 03/26/2018   Procedure: CARDIOVERSION;  Surgeon: Nelva Bush, MD;  Location: [facility] ORS;  Service: Cardiovascular;  Laterality: N/A;  . CARDIOVERSION N/A 04/24/2018   Procedure: CARDIOVERSION;  Surgeon: Minna Merritts, MD;  Location: [facility] ORS;  Service: Cardiovascular;  Laterality: N/A;  . ELECTROPHYSIOLOGY STUDY N/A 02/27/2012   Procedure: ELECTROPHYSIOLOGY STUDY;  Surgeon: Evans Lance, MD;  Location: [facility] CATH LAB;  Service: Cardiovascular;  Laterality: N/A;  . EPS and ablation for SVT  5/13   slow pathway ablation by Dr Lovena Le  . EYE SURGERY     surgery for detatched retina  . SUPRAVENTRICULAR TACHYCARDIA ABLATION N/A 02/27/2012   Procedure: SUPRAVENTRICULAR TACHYCARDIA ABLATION;  Surgeon: Evans Lance, MD;  Location: [facility] CATH LAB;  Service: Cardiovascular;  Laterality: N/A;   Family History  Problem Relation Age of Onset  . Alzheimer's disease Mother   . Stroke Father   . Breast cancer Father   . Heart disease Father        Pacemaker  . Breast cancer Paternal Aunt   . Kidney cancer Maternal Grandmother   . Alzheimer's disease Maternal Grandfather   . Thyroid disease Paternal Grandmother   . Stroke Paternal Grandfather    Social History   Tobacco Use  . Smoking status: Never Smoker  . Smokeless tobacco: Never Used  . Tobacco comment: smoking cessation materials not required  Substance Use Topics  . Alcohol use: No   Allergies  Allergen Reactions  . Codeine Nausea And Vomiting  . Erythromycin Itching and Swelling  . Septra [Sulfamethoxazole-Trimethoprim] Swelling  . Penicillins Swelling and Rash    Has patient had a PCN reaction causing immediate rash, facial/tongue/throat swelling, SOB or lightheadedness with hypotension: Yes Has patient had a PCN reaction causing severe rash involving mucus membranes or skin necrosis: No Has patient had a PCN reaction that required hospitalization: No Has patient had a PCN reaction occurring within  the last 10 years: No If all of the above answers are "NO", then may proceed with Cephalosporin use.      Review of Systems  Constitutional: Positive for fatigue (at times). Negative for appetite change.  HENT: Negative for congestion, postnasal drip and sore throat.   Eyes: Negative.   Respiratory: Positive for cough (minimal late in the day). Negative for chest tightness and shortness of breath.   Cardiovascular: Negative for chest pain, palpitations and leg swelling.  Gastrointestinal: Negative for abdominal distention and abdominal pain.  Endocrine: Negative.   Genitourinary:  Negative.   Musculoskeletal: Positive for back pain and neck pain.  Skin: Negative.   Allergic/Immunologic: Negative.   Neurological: Positive for light-headedness (on occasion). Negative for dizziness.  Hematological: Negative for adenopathy. Bruises/bleeds easily.  Psychiatric/Behavioral: Negative for dysphoric mood and sleep disturbance (sleeping on 1 pillow). The patient is not nervous/anxious.      Physical Exam Vitals signs and nursing note reviewed.  Constitutional:      Appearance: She is well-developed.  HENT:     Head: Normocephalic and atraumatic.  Neck:     Musculoskeletal: Normal range of motion and neck supple.     Vascular: No JVD.  Cardiovascular:     Rate and Rhythm: Normal rate and regular rhythm.  Pulmonary:     Effort: Pulmonary effort is normal. No respiratory distress.     Breath sounds: No wheezing or rales.  Abdominal:     General: There is no distension.     Palpations: Abdomen is soft.     Tenderness: There is no abdominal tenderness.  Musculoskeletal:        General: No tenderness.  Skin:    General: Skin is warm and dry.  Neurological:     Mental Status: She is alert and oriented to person, place, and time.  Psychiatric:        Behavior: Behavior normal.        Thought Content: Thought content normal.    Assessment & Plan:  1: Chronic heart failure with mildly reduced ejection fraction- - NYHA class II - euvolemic today - weighing daily. Reminded her to call for an overnight weight gain gain of >2 pounds or a weekly weight gain of >5 pounds - weight 131.4 from last visit 6 months ago - not adding salt and has been reading food labels. Reminded to closely follow a 2000mg  sodium diet - EF >40% so would not qualify for entresto; valsartan at max dose - BNP 04/22/18 was 614.0   2: HTN- - BP  - discussed adding spironolactone for resistant HTN but patient isn't interested at this time; if added in the future would need to watch potassium and  renal function closely - may benefit from renal ultrasound to rule out any structural issues - saw PCP Ancil Boozer) 06/02/2019 - BMP 06/02/2019 reviewed and showed sodium 140, potassium 4.2, creatinine 0.93 and GFR 60  3: Atrial fibrillation- - had telemedicine visit with cardiology Rockey Situ) 01/21/2019 - currently rate controlled - checks her heart rate twice daily because if her HR is <50, she only takes 1/2 of her metoprolol - cardioverted June/ July 2019   Medication list was reviewed.      Marland Kitchen

## 2019-06-28 ENCOUNTER — Other Ambulatory Visit: Payer: Self-pay | Admitting: Family Medicine

## 2019-06-28 DIAGNOSIS — E785 Hyperlipidemia, unspecified: Secondary | ICD-10-CM

## 2019-06-28 NOTE — Telephone Encounter (Signed)
Requested Prescriptions  Pending Prescriptions Disp Refills  . rosuvastatin (CRESTOR) 40 MG tablet [Pharmacy Med Name: ROSUVASTATIN 40MG  TABLETS] 90 tablet 3    Sig: TAKE 1 TABLET(40 MG) BY MOUTH DAILY     Cardiovascular:  Antilipid - Statins Passed - 06/28/2019  9:12 AM      Passed - Total Cholesterol in normal range and within 360 days    Cholesterol, Total  Date Value Ref Range Status  06/06/2016 164 100 - 199 mg/dL Final   Cholesterol  Date Value Ref Range Status  06/02/2019 128 <200 mg/dL Final         Passed - LDL in normal range and within 360 days    LDL Cholesterol (Calc)  Date Value Ref Range Status  06/02/2019 54 mg/dL (calc) Final    Comment:    Reference range: <100 . Desirable range <100 mg/dL for primary prevention;   <70 mg/dL for patients with CHD or diabetic patients  with > or = 2 CHD risk factors. Marland Kitchen LDL-C is now calculated using the Martin-Hopkins  calculation, which is a validated novel method providing  better accuracy than the Friedewald equation in the  estimation of LDL-C.  Cresenciano Genre et al. Annamaria Helling. [id number]): [id number]  (http://education.[employer].com/faq/FAQ164)          Passed - HDL in normal range and within 360 days    HDL  Date Value Ref Range Status  06/02/2019 57 > OR = 50 mg/dL Final  06/06/2016 54 >39 mg/dL Final         Passed - Triglycerides in normal range and within 360 days    Triglycerides  Date Value Ref Range Status  06/02/2019 86 <150 mg/dL Final         Passed - Patient is not pregnant      Passed - Valid encounter within last 12 months    Recent Outpatient Visits          3 weeks ago Dyslipidemia   [facility] Steele Sizer, MD   4 months ago Essential hypertension   [facility], Drue Stager, MD   9 months ago CKD (chronic kidney disease), stage III [facility])   [facility] Steele Sizer, MD   11 months ago Hospital discharge follow-up   [facility] Steele Sizer, MD   1 year ago Chronic a-fib [facility])   [facility] Steele Sizer, MD      Future Appointments            In 3 weeks Sharolyn Douglas, Clance Boll, NP [facility], [place]   In 5 months Steele Sizer, MD [facility], [place]   In 11 months  [place]           recent Lipid panel normal. (05/2019)

## 2019-07-01 ENCOUNTER — Emergency Department: Payer: Medicare Other

## 2019-07-01 ENCOUNTER — Ambulatory Visit: Payer: Medicare Other | Admitting: Family

## 2019-07-01 ENCOUNTER — Other Ambulatory Visit: Payer: Self-pay

## 2019-07-01 ENCOUNTER — Emergency Department
Admission: EM | Admit: 2019-07-01 | Discharge: 2019-07-01 | Disposition: A | Discharge status: Home / Self Care | Payer: Medicare Other | Attending: Student | Admitting: Student

## 2019-07-01 ENCOUNTER — Encounter: Payer: Self-pay | Admitting: Emergency Medicine

## 2019-07-01 DIAGNOSIS — N200 Calculus of kidney: Secondary | ICD-10-CM | POA: Insufficient documentation

## 2019-07-01 DIAGNOSIS — N183 Chronic kidney disease, stage 3 (moderate): Secondary | ICD-10-CM | POA: Diagnosis not present

## 2019-07-01 DIAGNOSIS — M5442 Lumbago with sciatica, left side: Secondary | ICD-10-CM | POA: Insufficient documentation

## 2019-07-01 DIAGNOSIS — M545 Low back pain: Secondary | ICD-10-CM | POA: Diagnosis not present

## 2019-07-01 DIAGNOSIS — I5042 Chronic combined systolic (congestive) and diastolic (congestive) heart failure: Secondary | ICD-10-CM | POA: Diagnosis not present

## 2019-07-01 DIAGNOSIS — I130 Hypertensive heart and chronic kidney disease with heart failure and stage 1 through stage 4 chronic kidney disease, or unspecified chronic kidney disease: Secondary | ICD-10-CM | POA: Diagnosis not present

## 2019-07-01 DIAGNOSIS — Z79899 Other long term (current) drug therapy: Secondary | ICD-10-CM | POA: Diagnosis not present

## 2019-07-01 DIAGNOSIS — E039 Hypothyroidism, unspecified: Secondary | ICD-10-CM | POA: Diagnosis not present

## 2019-07-01 LAB — URINALYSIS, COMPLETE (UACMP) WITH MICROSCOPIC
Bacteria, UA: NONE SEEN
Bilirubin Urine: NEGATIVE
Glucose, UA: NEGATIVE mg/dL
Hgb urine dipstick: NEGATIVE
Ketones, ur: NEGATIVE mg/dL
Leukocytes,Ua: NEGATIVE
Nitrite: NEGATIVE
Protein, ur: NEGATIVE mg/dL
Specific Gravity, Urine: 1.014 (ref 1.005–1.030)
Squamous Epithelial / HPF: NONE SEEN (ref 0–5)
pH: 5 (ref 5.0–8.0)

## 2019-07-01 MED ORDER — PREDNISONE 10 MG PO TABS: ORAL_TABLET | ORAL | 0 refills | Status: DC

## 2019-07-01 MED ORDER — ONDANSETRON 4 MG PO TBDP
4.0000 mg | ORAL_TABLET | Freq: Once | ORAL | Status: AC
  Administered 2019-07-01: 11:00:00 4 mg via ORAL
  Filled 2019-07-01: qty 1

## 2019-07-01 MED ORDER — OXYCODONE-ACETAMINOPHEN 5-325 MG PO TABS: 1.0000 | ORAL_TABLET | Freq: Four times a day (QID) | ORAL | 0 refills | Status: DC | PRN

## 2019-07-01 MED ORDER — CIPROFLOXACIN HCL 500 MG PO TABS: 500.0000 mg | ORAL_TABLET | Freq: Two times a day (BID) | ORAL | 0 refills | Status: DC

## 2019-07-01 MED ORDER — ONDANSETRON 4 MG PO TBDP: 4.0000 mg | ORAL_TABLET | Freq: Three times a day (TID) | ORAL | 0 refills | Status: DC | PRN

## 2019-07-01 MED ORDER — OXYCODONE-ACETAMINOPHEN 5-325 MG PO TABS
1.0000 | ORAL_TABLET | Freq: Once | ORAL | Status: AC
  Administered 2019-07-01: 1 via ORAL
  Filled 2019-07-01: qty 1

## 2019-07-01 NOTE — ED Notes (Signed)
Pt c/o lower back pain that started Sunday night - she reports that when she stepped off of a platform Sunday and then the pain started that night

## 2019-07-01 NOTE — ED Provider Notes (Signed)
[facility] Emergency Department Provider Note  ____________________________________________   First MD Initiated Contact with Patient 07/01/19 [id number]     (approximate)  I have reviewed the triage vital signs and the nursing notes.   HISTORY  Chief Complaint Back Pain   HPI [name] is a 78 y.o. female presents to the ED with complaint of left lower back pain that started 3 days ago.  Patient states that she stepped off a platform Sunday and began having pain.  Since that time her low back pain has started radiating into her left leg for the last 2 days.  Patient states that pain is increased with weightbearing.  She is unable to get any relief at home with over-the-counter medication which has been Tylenol.  She denies any urinary symptoms presently.  She has had UTIs in the past.  Currently she rates her pain as a 10/10.       Past Medical History:  Diagnosis Date  . Anxiety   . Bronchitis   . Chronic combined systolic (congestive) and diastolic (congestive) heart failure ([place])    a. 02/2018 Echo: EF 45-50%, diff HK, triv AI, mod MR, mildly dil LA/RA, nl RV fxn, mod TR. PASP 40-86mmHg.  Marland Kitchen Chronic kidney disease   . DJD (degenerative joint disease), cervical   . History of stress test    a. 01/2008 MV: EF 76%. Fair ex tol. No ischemia.  . Hyperlipidemia   . Hypertension   . Hypothyroid   . LBBB (left bundle branch block)   . Leg pain    a. 01/2018 ABI's wnl.  . Persistent atrial fibrillation    a. 01/2018 Event monitor: 45 runs of SVT, longes 14.5 sec. SVT felt to be Afib/flutter-->2% burden. Longest run of AF 4h 55m (CHA2DS2VASc = 4-->Xarelto).  . 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 Chronic heart failure ([place]) 05/24/2018  . Atrial fibrillation ([place]) 05/24/2018  . CKD (chronic kidney disease), stage III ([place]) 05/17/2018  . Mitral valve  insufficiency 05/07/2018  . Shortness of breath 05/07/2018  . Pulmonary hypertension, unspecified ([place]) 04/05/2018  . Chronic combined systolic (congestive) and diastolic (congestive) heart failure ([place]) 04/05/2018  . Paroxysmal sinus tachycardia ([place]) 01/29/2018  . Leg pain 01/04/2018  . PAD (peripheral artery disease) ([place]) 01/04/2018  . Carotid artery calcification, bilateral 12/19/2017  . Cervical radiculitis 12/19/2017  . Hyperglycemia 03/06/2016  . Hypothyroid 04/21/2015  . 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 Anxiety 04/21/2015  . Hyperlipidemia 04/21/2015  . Diuretic-induced hypokalemia 04/21/2015  . Palpitations 12/18/2012  . Hypertension 05/27/2012    Past Surgical History:  Procedure Laterality Date  . CARDIOVERSION N/A 03/26/2018   Procedure: CARDIOVERSION;  Surgeon: Nelva Bush, MD;  Location: [facility] ORS;  Service: Cardiovascular;  Laterality: N/A;  . CARDIOVERSION N/A 04/24/2018   Procedure: CARDIOVERSION;  Surgeon: Minna Merritts, MD;  Location: [facility] ORS;  Service: Cardiovascular;  Laterality: N/A;  . ELECTROPHYSIOLOGY STUDY N/A 02/27/2012   Procedure: ELECTROPHYSIOLOGY STUDY;  Surgeon: Evans Lance, MD;  Location: [facility] CATH LAB;  Service: Cardiovascular;  Laterality: N/A;  . EPS and ablation for SVT  5/13   slow pathway ablation by Dr Lovena Le  . EYE SURGERY     surgery for detatched retina  . SUPRAVENTRICULAR TACHYCARDIA ABLATION N/A 02/27/2012   Procedure: SUPRAVENTRICULAR TACHYCARDIA ABLATION;  Surgeon: Carleene Overlie  Peyton Najjar, MD;  Location: [facility] CATH LAB;  Service: Cardiovascular;  Laterality: N/A;    Prior to Admission medications   Medication Sig Start Date End Date Taking? Authorizing Provider  acetaminophen (TYLENOL) 650 MG CR tablet Take 1,300 mg by mouth every 8 (eight) hours as needed for pain.    [provider]  amiodarone (PACERONE) 200 MG tablet Take 1 tablet (200 mg total) by mouth daily. 07/24/18   Minna Merritts, MD   busPIRone (BUSPAR) 5 MG tablet Take 1 tablet (5 mg total) by mouth 2 (two) times daily. 06/02/19   Steele Sizer, MD  cholecalciferol (VITAMIN D) 1000 UNITS tablet Take 2,000 Units by mouth daily.    [provider]  doxazosin (CARDURA) 1 MG tablet Take 1 tablet (1 mg total) by mouth 2 (two) times daily. 07/24/18   Minna Merritts, MD  ELIQUIS 5 MG TABS tablet TAKE 1 TABLET(5 MG) BY MOUTH TWICE DAILY 06/11/19   Ancil Boozer, Drue Stager, MD  furosemide (LASIX) 20 MG tablet TAKE 1 TABLET(20 MG) BY MOUTH DAILY 05/19/19   Steele Sizer, MD  levothyroxine (SYNTHROID) 137 MCG tablet TAKE 1 TABLET(137 MCG) BY MOUTH EVERY MORNING 06/10/19   Ancil Boozer, Drue Stager, MD  metoprolol succinate (TOPROL-XL) 50 MG 24 hr tablet Take with or immediately following a meal. 06/02/19   Sowles, Drue Stager, MD  Multiple Vitamin (MULITIVITAMIN WITH MINERALS) TABS Take 1 tablet by mouth daily.    [provider]  ondansetron (ZOFRAN ODT) 4 MG disintegrating tablet Take 1 tablet (4 mg total) by mouth every 8 (eight) hours as needed for nausea or vomiting. 07/01/19   Johnn Hai, PA-C  oxyCODONE-acetaminophen (PERCOCET) 5-325 MG tablet Take 1 tablet by mouth every 6 (six) hours as needed for severe pain. 07/01/19   Johnn Hai, PA-C  potassium chloride (K-DUR) 10 MEQ tablet TAKE 1 TABLET(10 MEQ) BY MOUTH DAILY 05/19/19   Ancil Boozer, Drue Stager, MD  predniSONE (DELTASONE) 10 MG tablet Take 5 tablets  today, on day 2 take 4 tablets, day 3 take 3 tablets, day 4 take 2 tablets, day 5 take  1 tablets 07/01/19   Letitia Neri L, PA-C  rosuvastatin (CRESTOR) 40 MG tablet TAKE 1 TABLET(40 MG) BY MOUTH DAILY 06/28/19   Ancil Boozer, Drue Stager, MD  temazepam (RESTORIL) 15 MG capsule TAKE 1 CAPSULE(15 MG) BY MOUTH AT BEDTIME AS NEEDED FOR SLEEP 05/22/19   Ancil Boozer, Drue Stager, MD  valsartan (DIOVAN) 320 MG tablet TAKE 1 TABLET(320 MG) BY MOUTH DAILY 01/21/19   Minna Merritts, MD    Allergies Codeine, Erythromycin, Septra  [sulfamethoxazole-trimethoprim], and Penicillins  Family History  Problem Relation Age of Onset  . Alzheimer's disease Mother   . Stroke Father   . Breast cancer Father   . Heart disease Father        Pacemaker  . Breast cancer Paternal Aunt   . Kidney cancer Maternal Grandmother   . Alzheimer's disease Maternal Grandfather   . Thyroid disease Paternal Grandmother   . Stroke Paternal Grandfather     Social History Social History   Tobacco Use  . Smoking status: Never Smoker  . Smokeless tobacco: Never Used  . Tobacco comment: smoking cessation materials not required  Substance Use Topics  . Alcohol use: No  . Drug use: No    Review of Systems Constitutional: No fever/chills Cardiovascular: Denies chest pain. Respiratory: Denies shortness of breath. Gastrointestinal: No abdominal pain.  Positive nausea, no vomiting.  Genitourinary: Negative for dysuria. Musculoskeletal: Positive left low back pain.  Skin: Negative for rash. Neurological: Negative for headaches, focal weakness or numbness. ____________________________________________   PHYSICAL EXAM:  VITAL SIGNS: ED Triage Vitals  Enc Vitals Group     BP 07/01/19 0920 (!) 155/48     Pulse Rate 07/01/19 0920 66     Resp 07/01/19 0920 14     Temp 07/01/19 0920 97.6 F (36.4 C)     Temp src --      SpO2 07/01/19 0920 100 %     Weight 07/01/19 0920 125 lb (56.7 kg)     Height 07/01/19 0920 5\' 6"  (1.676 m)     Head Circumference --      Peak Flow --      Pain Score 07/01/19 0917 10     Pain Loc --      Pain Edu? --      Excl. in [place]? --    Constitutional: Alert and oriented. Well appearing and in no acute distress. Eyes: Conjunctivae are normal.  Head: Atraumatic. Neck: No stridor.   Cardiovascular: Normal rate, regular rhythm. Grossly normal heart sounds.  Good peripheral circulation. Respiratory: Normal respiratory effort.  No retractions. Lungs CTAB. Gastrointestinal: Soft and nontender. No distention. .  Musculoskeletal: There is tenderness on palpation of the lumbar spine and left paravertebral muscles.  Good muscle strength bilaterally.  Range of motion is guarded secondary to pain. Neurologic:  Normal speech and language. No gross focal neurologic deficits are appreciated. No gait instability. Skin:  Skin is warm, dry and intact.  Psychiatric: Mood and affect are normal. Speech and behavior are normal.  ____________________________________________   LABS (all labs ordered are listed, but only abnormal results are displayed)  Labs Reviewed  URINALYSIS, COMPLETE (UACMP) WITH MICROSCOPIC - Abnormal; Notable for the following components:      Result Value   Color, Urine YELLOW (*)    APPearance CLEAR (*)    All other components within normal limits    RADIOLOGY  Official radiology report(s): Dg Lumbar Spine 2-3 Views  Result Date: 07/01/2019 CLINICAL DATA:  Low back pain radiating into the left leg for 2 days. EXAM: LUMBAR SPINE - 2-3 VIEW COMPARISON:  None. FINDINGS: There is no fracture or malalignment. Scattered loss of disc space height and endplate spurring appear worst at L1-2, L2-3 and L3-4. Lower lumbar facet arthropathy is noted. Paraspinous structures demonstrate a 1.3 cm round calcification in the left abdomen likely representing and non obstructing renal stone. Atherosclerosis noted. IMPRESSION: No acute finding. Multilevel spondylosis. Atherosclerosis. Likely 1.3 cm nonobstructing left renal stone. Electronically Signed   By: Inge Rise M.D.   On: 07/01/2019 10:44    ____________________________________________   PROCEDURES  Procedure(s) performed (including Critical Care):  Procedures   ____________________________________________   INITIAL IMPRESSION / ASSESSMENT AND PLAN / ED COURSE  As part of my medical decision making, I reviewed the following data within the electronic MEDICAL RECORD NUMBER Notes from prior ED visits and [place] Controlled Substance Database   78 year old female presents to the ED with complaint of left low back pain with radiation to her left leg after stepping off a platform Sunday.  She also reports that prior to this event several weeks ago she has some nausea which disappeared.  She denies any UTI symptoms or history of stones.  There is been no incontinence of bowel or bladder.  Lumbar spine x-ray shows multiple level spondylosis and spurs that are worse at L1-L2, L2-L3 and L 3-4.  Incidental finding was a 1.3 cm nonobstructing  left renal stone that patient was not aware of.  This may account for the nausea that she experienced.  Patient was given medication to begin taking.  Zofran was given in case there is any continued nausea, Percocet as needed for pain and prednisone.  She is to follow-up with her PCP and also the urologist on call if there is any continued problems with the stone.  ____________________________________________   FINAL CLINICAL IMPRESSION(S) / ED DIAGNOSES  Final diagnoses:  Acute left-sided low back pain with left-sided sciatica  Left renal stone     ED Discharge Orders         Ordered    ciprofloxacin (CIPRO) 500 MG tablet  2 times daily,   Status:  Discontinued     07/01/19 1241    ondansetron (ZOFRAN ODT) 4 MG disintegrating tablet  Every 8 hours PRN     07/01/19 1241    oxyCODONE-acetaminophen (PERCOCET) 5-325 MG tablet  Every 6 hours PRN     07/01/19 1241    predniSONE (DELTASONE) 10 MG tablet     07/01/19 1245           Note:  This document was prepared using Dragon voice recognition software and may include unintentional dictation errors.    Johnn Hai, PA-C 07/01/19 1443    Lilia Pro., MD 07/01/19 [phone]

## 2019-07-01 NOTE — Discharge Instructions (Addendum)
Follow-up with your primary care provider if any continued problems with your back.  Also a kidney stone was seen on the left which may explain why you had some nausea 2 weeks ago.  A prescription for nausea medicine was sent to your pharmacy to take as needed.  Increase fluids.  Begin taking your antibiotic until completely finished.  Pain medication was also sent as needed for your back and for your kidney stone.  The prednisone is strictly for the sciatic pain that you are experiencing.  The name of the urologist was listed on your discharge papers.  Your primary care provider may want you to see the urologist if you continue having problems.

## 2019-07-01 NOTE — ED Notes (Signed)
Pt ambulatory to restroom

## 2019-07-01 NOTE — ED Triage Notes (Signed)
Says low back pain rad down left leg 2 days.  Says pain increased with wt bearing.

## 2019-07-07 DIAGNOSIS — M5126 Other intervertebral disc displacement, lumbar region: Secondary | ICD-10-CM | POA: Diagnosis not present

## 2019-07-07 DIAGNOSIS — M47812 Spondylosis without myelopathy or radiculopathy, cervical region: Secondary | ICD-10-CM | POA: Diagnosis not present

## 2019-07-09 ENCOUNTER — Other Ambulatory Visit: Payer: Self-pay | Admitting: Neurosurgery

## 2019-07-09 DIAGNOSIS — M47812 Spondylosis without myelopathy or radiculopathy, cervical region: Secondary | ICD-10-CM

## 2019-07-09 DIAGNOSIS — M5126 Other intervertebral disc displacement, lumbar region: Secondary | ICD-10-CM

## 2019-07-11 ENCOUNTER — Ambulatory Visit: Payer: Medicare Other | Admitting: Family

## 2019-07-14 ENCOUNTER — Other Ambulatory Visit: Payer: Self-pay | Admitting: Cardiovascular Disease

## 2019-07-17 ENCOUNTER — Telehealth: Payer: Self-pay

## 2019-07-17 MED ORDER — AMIODARONE HCL 200 MG PO TABS: 200.0000 mg | ORAL_TABLET | Freq: Every day | ORAL | 1 refills | Status: DC

## 2019-07-17 NOTE — Telephone Encounter (Signed)
RX sent to [facility].

## 2019-07-20 ENCOUNTER — Ambulatory Visit
Admission: RE | Admit: 2019-07-20 | Discharge: 2019-07-20 | Disposition: A | Discharge status: Home / Self Care | Payer: Medicare Other | Source: Ambulatory Visit | Attending: Neurosurgery | Admitting: Neurosurgery

## 2019-07-20 ENCOUNTER — Other Ambulatory Visit: Payer: Self-pay

## 2019-07-20 DIAGNOSIS — M47812 Spondylosis without myelopathy or radiculopathy, cervical region: Secondary | ICD-10-CM | POA: Diagnosis not present

## 2019-07-20 DIAGNOSIS — M5126 Other intervertebral disc displacement, lumbar region: Secondary | ICD-10-CM

## 2019-07-20 DIAGNOSIS — M542 Cervicalgia: Secondary | ICD-10-CM | POA: Diagnosis not present

## 2019-07-20 DIAGNOSIS — M545 Low back pain: Secondary | ICD-10-CM | POA: Diagnosis not present

## 2019-07-22 ENCOUNTER — Telehealth: Payer: Self-pay | Admitting: Cardiovascular Disease

## 2019-07-22 DIAGNOSIS — M5126 Other intervertebral disc displacement, lumbar region: Secondary | ICD-10-CM | POA: Diagnosis not present

## 2019-07-22 NOTE — Telephone Encounter (Signed)
° °  [facility] Pre-operative Risk Assessment    Request for surgical clearance:  1. What type of surgery is being performed? Left L4 -5 Microdiscectomy    2. When is this surgery scheduled? 07-30-19  3. What type of clearance is required (medical clearance vs. Pharmacy clearance to hold med vs. Both)? Both   4. Are there any medications that need to be held prior to surgery and how long? Please advise    5. Practice name and name of physician performing surgery?  [facility]   6. What is your office phone number [phone]   7.   What is your office fax number   [phone]  8.   Anesthesia type (None, local, MAC, general) ? Not noted    [name] 07/22/2019, 4:30 PM  _________________________________________________________________   (provider comments below)

## 2019-07-22 NOTE — Telephone Encounter (Signed)
Please comment on eliquis. 

## 2019-07-23 DIAGNOSIS — Z1159 Encounter for screening for other viral diseases: Secondary | ICD-10-CM | POA: Diagnosis not present

## 2019-07-23 NOTE — Telephone Encounter (Signed)
Patient with diagnosis of afib on Eliquis for anticoagulation.    Procedure: Left L4 -5 Microdiscectomy   Date of procedure: 07/30/2019  CHADS2-VASc score of  6 (CHF, HTN, AGE, CAD, AGE, female)  CrCl 43 ml/min  Per office protocol, patient can hold Eliquis for 3 days prior to procedure.

## 2019-07-23 NOTE — Telephone Encounter (Signed)
Left VM

## 2019-07-24 ENCOUNTER — Encounter: Payer: Self-pay | Admitting: Nurse Practitioner

## 2019-07-24 ENCOUNTER — Other Ambulatory Visit: Payer: Self-pay

## 2019-07-24 ENCOUNTER — Ambulatory Visit (INDEPENDENT_AMBULATORY_CARE_PROVIDER_SITE_OTHER): Payer: Medicare Other | Admitting: Nurse Practitioner

## 2019-07-24 VITALS — BP 190/70 | HR 58 | Ht 66.0 in | Wt 126.2 lb

## 2019-07-24 DIAGNOSIS — Z01810 Encounter for preprocedural cardiovascular examination: Secondary | ICD-10-CM | POA: Diagnosis not present

## 2019-07-24 DIAGNOSIS — I10 Essential (primary) hypertension: Secondary | ICD-10-CM

## 2019-07-24 DIAGNOSIS — E782 Mixed hyperlipidemia: Secondary | ICD-10-CM | POA: Diagnosis not present

## 2019-07-24 DIAGNOSIS — I5042 Chronic combined systolic (congestive) and diastolic (congestive) heart failure: Secondary | ICD-10-CM | POA: Diagnosis not present

## 2019-07-24 DIAGNOSIS — I4819 Other persistent atrial fibrillation: Secondary | ICD-10-CM

## 2019-07-24 HISTORY — PX: BACK SURGERY: SHX140

## 2019-07-24 MED ORDER — AMLODIPINE BESYLATE 5 MG PO TABS: 5.0000 mg | ORAL_TABLET | Freq: Every day | ORAL | 3 refills | Status: DC

## 2019-07-24 NOTE — Patient Instructions (Signed)
Medication Instructions:  1- START Amlodipine Take 1 tablet (5 mg total) by mouth daily. If you need a refill on your cardiac medications before your next appointment, please call your pharmacy.   Lab work: None ordered  If you have labs (blood work) drawn today and your tests are completely normal, you will receive your results only by: Marland Kitchen MyChart Message (if you have MyChart) OR . A paper copy in the mail If you have any lab test that is abnormal or we need to change your treatment, we will call you to review the results.  Testing/Procedures: None ordered   Follow-Up: At [facility], you and your health needs are our priority.  As part of our continuing mission to provide you with exceptional heart care, we have created designated Provider Care Teams.  These Care Teams include your primary Cardiologist (physician) and Advanced Practice Providers (APPs -  Physician Assistants and Nurse Practitioners) who all work together to provide you with the care you need, when you need it. You will need a follow up appointment in 3 months.  You may see Ida Rogue, MD or Murray Hodgkins, NP.    Any Other Special Instructions Will Be Listed Below (If Applicable). 1- Call Monday with BP readings

## 2019-07-24 NOTE — Progress Notes (Addendum)
Office Visit    Patient Name: KIONDRA FRACE Date of Encounter: 07/24/2019  Primary Care Provider:  Steele Sizer, MD Primary Cardiologist:  Ida Rogue, MD  Chief Complaint    78 year old female with a history of AVNRT status post catheter ablation, paroxysmal atrial fibrillation on amiodarone and Xarelto, left bundle branch block, hypertension, hyperlipidemia, degenerative joint disease, leg pain with normal ABIs (01/2018), combined systolic and diastolic heart failure, and mild carotid disease, who presents for follow-up of A. fib and heart failure.  Past Medical History    Past Medical History:  Diagnosis Date   Anxiety    Bronchitis    Chronic combined systolic (congestive) and diastolic (congestive) heart failure ([facility])    a. 02/2018 Echo: EF 45-50%, diff HK, triv AI, mod MR, mildly dil LA/RA, nl RV fxn, mod TR. PASP 40-7mmHg.   Chronic kidney disease    DJD (degenerative joint disease), cervical    History of stress test    a. 01/2008 MV: EF 76%. Fair ex tol. No ischemia.   Hyperlipidemia    Hypertension    Hypothyroid    LBBB (left bundle branch block)    Leg pain    a. 01/2018 ABI's wnl.   Lumbar spondylosis    Persistent atrial fibrillation ([place])    a. 01/2018 Event monitor: 45 runs of SVT, longes 14.5 sec. SVT felt to be Afib/flutter-->2% burden. Longest run of AF 4h 58m (CHA2DS2VASc = 4-->Xarelto); b. 03/2019 s/p DCCV; c. 04/2019 Repeat Amio load and DCCV.   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   Past Surgical History:  Procedure Laterality Date   CARDIOVERSION N/A 03/26/2018   Procedure: CARDIOVERSION;  Surgeon: Nelva Bush, MD;  Location: [facility] ORS;  Service: Cardiovascular;  Laterality: N/A;   CARDIOVERSION N/A 04/24/2018   Procedure: CARDIOVERSION;  Surgeon: Minna Merritts, MD;  Location: [place] [facility];  Service: Cardiovascular;  Laterality: N/A;   ELECTROPHYSIOLOGY STUDY N/A 02/27/2012   Procedure: ELECTROPHYSIOLOGY STUDY;  Surgeon: Evans Lance, MD;  Location: [facility] CATH LAB;  Service: Cardiovascular;  Laterality: N/A;   EPS and ablation for SVT  5/13   slow pathway ablation by Dr Lovena Le   EYE SURGERY     surgery for detatched retina   SUPRAVENTRICULAR TACHYCARDIA ABLATION N/A 02/27/2012   Procedure: SUPRAVENTRICULAR TACHYCARDIA ABLATION;  Surgeon: Evans Lance, MD;  Location: [facility] CATH LAB;  Service: Cardiovascular;  Laterality: N/A;    Allergies  Allergies  Allergen Reactions   Codeine Nausea And Vomiting   Erythromycin Itching and Swelling   Septra [Sulfamethoxazole-Trimethoprim] Swelling   Penicillins Swelling and Rash    Has patient had a PCN reaction causing immediate rash, facial/tongue/throat swelling, SOB or lightheadedness with hypotension: Yes Has patient had a PCN reaction causing severe rash involving mucus membranes or skin necrosis: No Has patient had a PCN reaction that required hospitalization: No Has patient had a PCN reaction occurring within the last 10 years: No If all of the above answers are "NO", then may proceed with Cephalosporin use.     History of Present Illness    78 year old female with the above complex past medical history including AVNRT/SVT status post catheter ablation 2013, left bundle branch block, hypertension, hyperlipidemia, hypothyroidism, chronic combined systolic and diastolic congestive heart failure, and persistent atrial fibrillation managed with amiodarone and Xarelto.  In February 2019, she was noted to have frequent PVCs and fusion beats and subsequent event monitoring showed runs  of SVT as well as runs of atrial fibrillation.  She was placed on Xarelto and when she was seen in clinic in May 2019, she was noted to be in rapid atrial fibrillation despite beta-blocker and calcium channel blocker therapy.  She was placed on amiodarone..  Plans were made for outpatient cardioversion however, she was admitted with heart  failure in the setting of rapid A. fib in June 2019.  She was cardioverted at that time but had recurrent A. fib and heart failure 1 month later and required repeat amiodarone loading and cardioversion.  She was last seen via telemedicine visit in late March of this year, at which time she was doing well.  Unfortunately, she was seen in the emergency department in early September with worsening low back pain.  Lumbar films showed multiple level spondylosis and spurs worse at L1-2, L2-3, and L3-4.  She has since continued to have significant lower back and left leg pain and has been seen by [facility] and is now pending left L4-5 microdiscectomy.  This is scheduled for next Wednesday.  From a cardiac standpoint, she has done well.  Prior to her back injury 4 weeks ago, she was walking 6 laps around [place] daily without any symptoms or limitations.  She denies chest pain, dyspnea, palpitations, PND, orthopnea, dizziness, syncope, edema, or early satiety.  She checks her blood pressure at home and typically notes that it runs in the 140s.  She is elevated today at 190/70 with similar findings on repeat.  She thinks recent back and leg pain have been driving up her pressure.  Home Medications    Prior to Admission medications   Medication Sig Start Date End Date Taking? Authorizing Provider  acetaminophen (TYLENOL) 650 MG CR tablet Take 1,300 mg by mouth every 8 (eight) hours as needed for pain.    [provider]  amiodarone (PACERONE) 200 MG tablet Take 1 tablet (200 mg total) by mouth daily. 07/17/19   Minna Merritts, MD  busPIRone (BUSPAR) 5 MG tablet Take 1 tablet (5 mg total) by mouth 2 (two) times daily. 06/02/19   Steele Sizer, MD  cholecalciferol (VITAMIN D) 1000 UNITS tablet Take 2,000 Units by mouth daily.    [provider]  doxazosin (CARDURA) 1 MG tablet TAKE 1 TABLET(1 MG) BY MOUTH TWICE DAILY 07/14/19   Gollan, Kathlene November, MD  ELIQUIS 5 MG TABS tablet TAKE  1 TABLET(5 MG) BY MOUTH TWICE DAILY 06/11/19   Ancil Boozer, Drue Stager, MD  furosemide (LASIX) 20 MG tablet TAKE 1 TABLET(20 MG) BY MOUTH DAILY 05/19/19   Steele Sizer, MD  levothyroxine (SYNTHROID) 137 MCG tablet TAKE 1 TABLET(137 MCG) BY MOUTH EVERY MORNING 06/10/19   Steele Sizer, MD  metoprolol succinate (TOPROL-XL) 50 MG 24 hr tablet Take with or immediately following a meal. 06/02/19   Sowles, Drue Stager, MD  Multiple Vitamin (MULITIVITAMIN WITH MINERALS) TABS Take 1 tablet by mouth daily.    [provider]  ondansetron (ZOFRAN ODT) 4 MG disintegrating tablet Take 1 tablet (4 mg total) by mouth every 8 (eight) hours as needed for nausea or vomiting. 07/01/19   Johnn Hai, PA-C  oxyCODONE-acetaminophen (PERCOCET) 5-325 MG tablet Take 1 tablet by mouth every 6 (six) hours as needed for severe pain. 07/01/19   Johnn Hai, PA-C  potassium chloride (K-DUR) 10 MEQ tablet TAKE 1 TABLET(10 MEQ) BY MOUTH DAILY 05/19/19   Ancil Boozer, Drue Stager, MD  predniSONE (DELTASONE) 10 MG tablet Take 5 tablets  today, on day 2 take 4 tablets, day 3 take 3 tablets, day 4 take 2 tablets, day 5 take  1 tablets 07/01/19   Letitia Neri L, PA-C  rosuvastatin (CRESTOR) 40 MG tablet TAKE 1 TABLET(40 MG) BY MOUTH DAILY 06/28/19   Ancil Boozer, Drue Stager, MD  temazepam (RESTORIL) 15 MG capsule TAKE 1 CAPSULE(15 MG) BY MOUTH AT BEDTIME AS NEEDED FOR SLEEP 05/22/19   Ancil Boozer, Drue Stager, MD  valsartan (DIOVAN) 320 MG tablet TAKE 1 TABLET(320 MG) BY MOUTH DAILY 01/21/19   Minna Merritts, MD    Review of Systems    Significant low back and left leg pain with some numbness of the left leg.  She denies chest pain, dyspnea, palpitations, PND, orthopnea, dizziness, syncope, edema, or early satiety.  All other systems reviewed and are otherwise negative except as noted above.  Physical Exam    VS:  BP (!) 190/70 (BP Location: Left Arm, Patient Position: Sitting, Cuff Size: Normal)    Pulse (!) 58    Ht 5\' 6"  (1.676 m)    Wt 126 lb 4 oz  (57.3 kg)    BMI 20.38 kg/m  , BMI Body mass index is 20.38 kg/m. BP 195/80 on repeat. GEN: Well nourished, well developed, in no acute distress. HEENT: normal. Neck: Supple, no JVD, carotid bruits, or masses. Cardiac: RRR, 2/6 systolic ejection murmur at the upper sternal borders, no rubs, or gallops. No clubbing, cyanosis, edema.  Radials/PT 2+ and equal bilaterally.  Respiratory:  Respirations regular and unlabored, clear to auscultation bilaterally. GI: Soft, nontender, nondistended, BS + x 4. MS: no deformity or atrophy. Skin: warm and dry, no rash. Neuro:  Strength and sensation are intact. Psych: Normal affect.  Accessory Clinical Findings    ECG personally reviewed by me today -sinus bradycardia, 59, PVCs, left axis deviation, left bundle branch block- no acute changes.  Lab Results  Component Value Date   WBC 7.4 06/02/2019   HGB 13.2 06/02/2019   HCT 39.3 06/02/2019   MCV 97.3 06/02/2019   PLT 206 06/02/2019   Lab Results  Component Value Date   CREATININE 0.93 06/02/2019   BUN 15 06/02/2019   NA 140 06/02/2019   K 4.2 06/02/2019   CL 103 06/02/2019   CO2 30 06/02/2019   Lab Results  Component Value Date   ALT 21 06/02/2019   AST 23 06/02/2019   ALKPHOS 64 04/22/2018   BILITOT 0.7 06/02/2019   Lab Results  Component Value Date   CHOL 128 06/02/2019   HDL 57 06/02/2019   LDLCALC 54 06/02/2019   TRIG 86 06/02/2019   CHOLHDL 2.2 06/02/2019     Assessment & Plan    1.  Chronic combined systolic and diastolic congestive heart failure: EF of 45 to 50% in the setting of atrial fibrillation earlier this year.  She has been doing well over the past few months from this standpoint and is euvolemic on examination today.  She remains in sinus rhythm.  Prior to recent back injury occurring about 4 weeks ago, she was walking regularly without any symptoms or limitations.  She remains on beta-blocker and ARB therapy.  Blood pressure is not controlled today.  I am  adding amlodipine 5 mg daily to her regimen.  2.  Persistent atrial fibrillation: Status post cardioversions in June 2020 and July 2020.  Maintaining sinus rhythm on amiodarone.  She is anticoagulated with Eliquis in the setting of a CHA2DS2VASc of 5.  She is pending lumbar surgery next  week and will be able to hold Eliquis for 3 days prior to surgery without a need for bridging.  Recommend resumption of Eliquis once it is felt to be feasible from a surgical standpoint.  Continue beta-blocker.  3.  Essential hypertension: Blood pressure is quite high today at 190/70.  She says she is typically in the 140s at home.  I did repeat her blood pressure and got 195/80.  I am adding amlodipine 5 mg daily to her regimen.  She does have a cuff at home and typically checks her pressure twice a day.  I have asked her to give [place] a call on Monday with some recordings so that we can decide whether or not she needs additional amlodipine.  She thinks back and leg pain are driving up her pressure currently.  Would like to see her pressure in better shape prior to her surgery.  4.  Lumbar spondylosis pending lumbar surgery/preoperative cardiovascular evaluation: Patient with somewhat sudden onset of low back pain and left leg numbness in early September.  Prior to that, she was quite active and walking daily without symptoms or limitations.  Risk of a major cardiac event calculates to 0.9% (RCRI).  No indication for ischemic testing at this time.  Recommend continuation of beta-blocker and statin therapy throughout the perioperative period.  As outlined above, she may hold Eliquis beginning 3 days prior to surgery with a plan to resume once it is felt to be feasible from a surgical standpoint.  5.  Hyperlipidemia: LDL of 54 in August.  LFTs normal at that time.  Continue statin therapy.  6.  PVCs: Noted on 12 lead this AM. Asymptomatic.  No significant by the time I examined her.  Cont  blocker and amio.   7.   Disposition: Adding amlodipine today and have asked her to call us on Monday with some readings.  We will plan to see her back in clinic in 3 months however if pressures still elevated on Monday, we will plan to see her sooner.  Murray Hodgkins, NP 07/24/2019, 9:21 AM

## 2019-07-28 ENCOUNTER — Encounter: Payer: Self-pay | Admitting: Family Medicine

## 2019-07-28 ENCOUNTER — Telehealth: Payer: Self-pay | Admitting: Cardiovascular Disease

## 2019-07-28 NOTE — Telephone Encounter (Signed)
BP readings Per [name] patient to call back    144/71 Friday  148/65  Saturday   148/65  Sunday   138/58  Monday

## 2019-07-28 NOTE — Telephone Encounter (Signed)
I spoke to the patient who is reporting her recent Bps requested by [name].  She said that she feels good and believes these are elevated because of her back pain.  Please advise, if necessary.

## 2019-07-29 MED ORDER — AMLODIPINE BESYLATE 10 MG PO TABS: 10.0000 mg | ORAL_TABLET | Freq: Every day | ORAL | 1 refills | Status: DC

## 2019-07-29 NOTE — Telephone Encounter (Signed)
bp's much improved. Rec increase amlodipine to 10mg  daily.

## 2019-07-29 NOTE — Telephone Encounter (Signed)
Patient is agreeable wit Ignacia Bayley, NP recommendation to increase Amlodipine to 10mg  daily. Rx sent to the patients pharmacy. Patient voiced appreciation for the call.

## 2019-07-29 NOTE — Telephone Encounter (Signed)
   Primary Cardiologist: Ida Rogue, MD  Chart reviewed as part of pre-operative protocol coverage. Given past medical history and time since last visit, based on ACC/AHA guidelines, TEMILOLA HAUTALA would be at acceptable risk for the planned procedure without further cardiovascular testing.   Pt cleared for surgical procedure during office visit on 07/24/2019 (note sent). Plan was to hold Eliquis 3 days prior to procedure with resumption once it is felt feasible from a surgical standpoint. Office note sent to requesting team.  I will route this recommendation to the requesting party via Epic fax function and remove from pre-op pool.  Please call with questions.  Kathyrn Drown, NP 07/29/2019, 11:25 AM

## 2019-07-30 DIAGNOSIS — M7138 Other bursal cyst, other site: Secondary | ICD-10-CM | POA: Diagnosis not present

## 2019-07-30 DIAGNOSIS — M5126 Other intervertebral disc displacement, lumbar region: Secondary | ICD-10-CM | POA: Diagnosis not present

## 2019-08-11 ENCOUNTER — Other Ambulatory Visit: Payer: Self-pay | Admitting: Family Medicine

## 2019-08-11 DIAGNOSIS — F411 Generalized anxiety disorder: Secondary | ICD-10-CM

## 2019-08-25 ENCOUNTER — Telehealth: Payer: Self-pay | Admitting: Cardiovascular Disease

## 2019-08-25 NOTE — Telephone Encounter (Signed)
Called the patient and informed her that she is to hold her Eliquis for 3 days prior to the procedure and to restart once it is feasible from a surgical standpoint. Patient verbalized an understanding and she asked if we will be letting Dr. Lauris Poag office know. I informed her that the same information I gave her will be faxed to Dr. Lauris Poag office. She thanked me for calling and all questions were answered

## 2019-08-25 NOTE — Telephone Encounter (Signed)
° °   [facility] Pre-operative Risk Assessment    Request for surgical clearance:  1. What type of surgery is being performed? Spinal injection    2. When is this surgery scheduled? TBD    3. What type of clearance is required (medical clearance vs. Pharmacy clearance to hold med vs. Both)? both  4. Are there any medications that need to be held prior to surgery and how long? Discontinue Eliquis 3-5 days prior   5. Practice name and name of physician performing surgery? [facility], Dr Brien Few    6. What is your office phone number [phone]    7.   What is your office fax number [phone]  8.   Anesthesia type (None, local, MAC, general) ? None listed    Ace Gins 08/25/2019, 1:31 PM  _________________________________________________________________   (provider comments below)

## 2019-08-25 NOTE — Telephone Encounter (Signed)
   Primary Cardiologist: Ida Rogue, MD  Chart reviewed as part of pre-operative protocol coverage. Given past medical history and time since last visit, based on ACC/AHA guidelines, [name] would be at acceptable risk for the planned procedure without further cardiovascular testing.   I will route this recommendation to the requesting party via Epic fax function and remove from pre-op pool.  Please call with questions. Patient was cleared by Ignacia Bayley NP on 07/24/2019. She may hold Eliquis for 3 days prior to the procedure and restart once it is felt to be feasible from a surgical standpoint.  [place], [place] 08/25/2019, 4:09 PM

## 2019-09-01 DIAGNOSIS — C44619 Basal cell carcinoma of skin of left upper limb, including shoulder: Secondary | ICD-10-CM | POA: Diagnosis not present

## 2019-09-01 DIAGNOSIS — C4491 Basal cell carcinoma of skin, unspecified: Secondary | ICD-10-CM | POA: Diagnosis not present

## 2019-09-03 DIAGNOSIS — M47812 Spondylosis without myelopathy or radiculopathy, cervical region: Secondary | ICD-10-CM | POA: Diagnosis not present

## 2019-09-03 DIAGNOSIS — I10 Essential (primary) hypertension: Secondary | ICD-10-CM | POA: Diagnosis not present

## 2019-09-07 ENCOUNTER — Other Ambulatory Visit: Payer: Self-pay | Admitting: Family Medicine

## 2019-09-07 DIAGNOSIS — E039 Hypothyroidism, unspecified: Secondary | ICD-10-CM

## 2019-09-15 DIAGNOSIS — C4491 Basal cell carcinoma of skin, unspecified: Secondary | ICD-10-CM | POA: Diagnosis not present

## 2019-09-15 DIAGNOSIS — C44712 Basal cell carcinoma of skin of right lower limb, including hip: Secondary | ICD-10-CM | POA: Diagnosis not present

## 2019-09-29 DIAGNOSIS — L570 Actinic keratosis: Secondary | ICD-10-CM | POA: Diagnosis not present

## 2019-10-13 ENCOUNTER — Other Ambulatory Visit: Payer: Self-pay | Admitting: Cardiovascular Disease

## 2019-10-28 DIAGNOSIS — M47892 Other spondylosis, cervical region: Secondary | ICD-10-CM | POA: Diagnosis not present

## 2019-10-28 DIAGNOSIS — I10 Essential (primary) hypertension: Secondary | ICD-10-CM | POA: Diagnosis not present

## 2019-10-28 DIAGNOSIS — M5416 Radiculopathy, lumbar region: Secondary | ICD-10-CM | POA: Diagnosis not present

## 2019-10-28 DIAGNOSIS — M47812 Spondylosis without myelopathy or radiculopathy, cervical region: Secondary | ICD-10-CM | POA: Diagnosis not present

## 2019-10-30 ENCOUNTER — Telehealth: Payer: Self-pay | Admitting: Cardiovascular Disease

## 2019-10-30 NOTE — Telephone Encounter (Addendum)
Pt takes Eliquis for afib with CHADS2VASc score of 6 (age x2, sex, CHF, HTN, CAD/PAD). CrCl is 85mL/min. Ok to hold Eliquis for 3 days prior to spinal injection.

## 2019-10-30 NOTE — Telephone Encounter (Signed)
   [facility] Pre-operative Risk Assessment    Request for surgical clearance:  What type of surgery is being performed? SPINAL INJECTION  1. When is this surgery scheduled? TBD  2. What type of clearance is required (medical clearance vs. Pharmacy clearance to hold med vs. Both)? BOTH  3. Are there any medications that need to be held prior to surgery and how long? ELIQUIS, 3 DAYS PRIOR  4. Practice name and name of physician performing surgery?   [facility]  5. What is your office phone number [phone]   7.   What is your office fax number NOT LISTED  8.   Anesthesia type (None, local, MAC, general) ? NOT LISTED   Lucienne Minks 10/30/2019, 11:03 AM  _________________________________________________________________   (provider comments below)

## 2019-10-31 NOTE — Telephone Encounter (Signed)
   Primary Cardiologist: Ida Rogue, MD  Chart reviewed as part of pre-operative protocol coverage. Patient was contacted 10/31/2019 in reference to pre-operative risk assessment for pending surgery as outlined below.  FAHMIDA MERGEL was last seen on 07/24/2019 by Ignacia Bayley, NP.  Since that day, INDIYA DARITY has done well from a cardiac standpoint. She has been having trouble with neck pain, for which she is anticipating a spinal injection. She stays active, walking in her neighborhood or at [place]. She can complete 4 METs without anginal complaints.  Therefore, based on ACC/AHA guidelines, the patient would be at acceptable risk for the planned procedure without further cardiovascular testing.   Per pharmacy recommendation, patient can hold eliquis 3 days prior to her upcoming spinal injection. Eliquis should be restarted as soon as she is cleared to do so by her neurosurgeon.   I will route this recommendation to the requesting party via Epic fax function and remove from pre-op pool.  Please call with questions.  Abigail Butts, PA-C 10/31/2019, 11:50 AM

## 2019-10-31 NOTE — Telephone Encounter (Signed)
   Primary Cardiologist: Ida Rogue, MD  Left a message for patient to call back for further preop risk assessment.  Abigail Butts, PA-C 10/31/2019, 8:34 AM

## 2019-11-03 NOTE — Progress Notes (Signed)
Date:  11/05/2019   ID:  [name], DOB [birth year], MRN [id number]  Patient Location:  [address] [place] [address]   Provider location:   [facility], [place] office  PCP:  Steele Sizer, MD  Cardiologist:  Ida Rogue, MD   Chief Complaint  Patient presents with  . office visit    3 mo F/U; Meds verbally reviewed with patient.    History of Present Illness:    [name] is a 79 y.o. 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 Lovena Le to have sinus tachycardia, lost to cardiology follow-up Carotid u/s  01/2018 with no significant blockages, bruit on the left Ejection fraction 45 to 50%, mild to moderately elevated right heart pressures on echo May 2019 Several cardioversions for atrial fibrillation June and July 2019 Who presents for follow-up of her paroxysmal  atrial fibrillation  September 2020 with worsening low back pain.   Lumbar films showed multiple level spondylosis and spurs worse at L1-2, L2-3, and L3-4.   Seen by [facility]  s/p  left L4-5 microdiscectomy.  Has neck surgery scheduled in 2 weeks, in the office need to hold Eliquis 3 days  Sometimes BP drops in the AM, "down to [id number] systolic" Will hold some of her morning medications until it comes up  On average systolic pressure [id number] at home  No regular exercise program, limited by her neck discomfort but does her ADLs.  Denies any chest pain shortness of breath on exertion No recent atrial fibrillation episodes Minimal lower extremity edema  Will take extra Lasix in the afternoon sometimes for worsening leg edema  Lab work reviewed with her, normal renal function August 2020 GFR greater than 60  Weight is stable  EKG personally reviewed by myself on todays visit Shows NSR rate 59 bpm, LBBB  Past medical history  reviewed hospital September 2019 hypokalemia, cystitis with hematuria, acute kidney injury On arrival potassium was 2.6 creatinine 1.57   hospital July 2019 for acute on chronic combined CHF Persistent atrial fibrillation with RVR and respiratory distress with hypoxia Treated with Lasix Status post briefly successful DCCV on 6/4. back in A. fib approximately 6/7 to 6/8,  DCCV 7/3, successful, bradycardia  Event monitor paroxysmal atrial fibrillation   mild carotid stenosis on the left Initially found on x-ray of neck.  vascular study done back in 2011 that showed mild plaque formation. Repeat ultrasound April 2019 with plaque noted,  less than 50%   Has a boyfriend, goes walking, he checks twice a day   Past Medical History:  Diagnosis Date  . Anxiety   . Bronchitis   . Chronic combined systolic (congestive) and diastolic (congestive) heart failure ([place])    a. 02/2018 Echo: EF 45-50%, diff HK, triv AI, mod MR, mildly dil LA/RA, nl RV fxn, mod TR. PASP 40-41mmHg.  Marland Kitchen Chronic kidney disease   . DJD (degenerative joint disease), cervical   . History of stress test    a. 01/2008 MV: EF 76%. Fair ex tol. No ischemia.  . Hyperlipidemia   . Hypertension   . Hypothyroid   . LBBB (left bundle branch block)   . Leg pain    a. 01/2018 ABI's wnl.  . Lumbar spondylosis   . Persistent atrial fibrillation ([place])    a. 01/2018 Event monitor: 45 runs of SVT, longest 14.5 sec.  SVT felt to be Afib/flutter-->2% burden. Longest run of AF 4h 64m (CHA2DS2VASc = 5-->Xarelto); b. 03/2019 s/p DCCV; c. 04/2019 Repeat Amio load and DCCV.  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 CARDIOVERSION N/A 03/26/2018   Procedure: CARDIOVERSION;  Surgeon: Nelva Bush, MD;  Location: [facility] ORS;  Service: Cardiovascular;  Laterality: N/A;  . CARDIOVERSION N/A 04/24/2018   Procedure: CARDIOVERSION;  Surgeon:  Minna Merritts, MD;  Location: [facility] ORS;  Service: Cardiovascular;  Laterality: N/A;  . ELECTROPHYSIOLOGY STUDY N/A 02/27/2012   Procedure: ELECTROPHYSIOLOGY STUDY;  Surgeon: Evans Lance, MD;  Location: [facility] CATH LAB;  Service: Cardiovascular;  Laterality: N/A;  . EPS and ablation for SVT  5/13   slow pathway ablation by Dr Lovena Le  . EYE SURGERY     surgery for detatched retina  . SUPRAVENTRICULAR TACHYCARDIA ABLATION N/A 02/27/2012   Procedure: SUPRAVENTRICULAR TACHYCARDIA ABLATION;  Surgeon: Evans Lance, MD;  Location: [facility] CATH LAB;  Service: Cardiovascular;  Laterality: N/A;     Current Meds  Medication Sig  . acetaminophen (TYLENOL) 650 MG CR tablet Take 1,300 mg by mouth every 8 (eight) hours as needed for pain.  Marland Kitchen amiodarone (PACERONE) 200 MG tablet Take 1 tablet (200 mg total) by mouth daily.  Marland Kitchen amLODipine (NORVASC) 10 MG tablet Take 1 tablet (10 mg total) by mouth daily.  . busPIRone (BUSPAR) 5 MG tablet TAKE 1 TABLET BY MOUTH TWICE DAILY  . cholecalciferol (VITAMIN D) 1000 UNITS tablet Take 2,000 Units by mouth daily.  Marland Kitchen doxazosin (CARDURA) 1 MG tablet TAKE 1 TABLET(1 MG) BY MOUTH TWICE DAILY  . ELIQUIS 5 MG TABS tablet TAKE 1 TABLET(5 MG) BY MOUTH TWICE DAILY  . furosemide (LASIX) 20 MG tablet TAKE 1 TABLET(20 MG) BY MOUTH DAILY  . levothyroxine (SYNTHROID) 137 MCG tablet TAKE 1 TABLET(137 MCG) BY MOUTH EVERY MORNING  . metoprolol succinate (TOPROL-XL) 50 MG 24 hr tablet Take with or immediately following a meal.  . Multiple Vitamin (MULITIVITAMIN WITH MINERALS) TABS Take 1 tablet by mouth daily.  . potassium chloride (K-DUR) 10 MEQ tablet TAKE 1 TABLET(10 MEQ) BY MOUTH DAILY  . rosuvastatin (CRESTOR) 40 MG tablet TAKE 1 TABLET(40 MG) BY MOUTH DAILY  . temazepam (RESTORIL) 15 MG capsule TAKE 1 CAPSULE(15 MG) BY MOUTH AT BEDTIME AS NEEDED FOR SLEEP  . valsartan (DIOVAN) 320 MG tablet TAKE 1 TABLET(320 MG) BY MOUTH DAILY     Allergies:   Codeine, Erythromycin, Septra  [sulfamethoxazole-trimethoprim], and Penicillins   Social History   Tobacco Use  . Smoking status: Never Smoker  . Smokeless tobacco: Never Used  . Tobacco comment: smoking cessation materials not required  Substance Use Topics  . Alcohol use: No  . Drug use: No      Family Hx: The patient's family history includes Alzheimer's disease in her maternal grandfather and mother; Breast cancer in her father and paternal aunt; Heart disease in her father; Kidney cancer in her maternal grandmother; Stroke in her father and paternal grandfather; Thyroid disease in her paternal grandmother.  ROS:   Please see the history of present illness.    Review of Systems  Constitutional: Negative.   HENT: Negative.   Respiratory: Negative.   Cardiovascular: Negative.   Gastrointestinal: Negative.   Musculoskeletal: Negative.   Neurological: Negative.   Psychiatric/Behavioral: Negative.   All other systems reviewed and are negative.  Labs/Other Tests and Data Reviewed:    Recent Labs: 06/02/2019: ALT 21; BUN 15; Creat 0.93; Hemoglobin 13.2; Platelets 206; Potassium 4.2; Sodium 140; TSH 1.20   Recent Lipid Panel Lab Results  Component Value Date/Time   CHOL 128 06/02/2019 08:36 AM   CHOL 164 06/06/2016 09:07 AM   TRIG 86 06/02/2019 08:36 AM   HDL 57 06/02/2019 08:36 AM   HDL 54 06/06/2016 09:07 AM   CHOLHDL 2.2 06/02/2019 08:36 AM   LDLCALC 54 06/02/2019 08:36 AM    Wt Readings from Last 3 Encounters:  11/05/19 130 lb 8 oz (59.2 kg)  07/24/19 126 lb 4 oz (57.3 kg)  07/01/19 125 lb (56.7 kg)     Exam:    Vital Signs:  BP (!) 164/64 (BP Location: Left Arm, Patient Position: Sitting, Cuff Size: Normal)   Pulse (!) 59   Temp 98.2 F (36.8 C)   Ht 5\' 6"  (1.676 m)   Wt 130 lb 8 oz (59.2 kg)   SpO2 98%   BMI 21.06 kg/m    Constitutional:  oriented to person, place, and time. No distress.  HENT:  Head: Grossly normal Eyes:  no discharge. No scleral icterus.  Neck: No JVD, 2+  carotid bruit on the left Cardiovascular: Regular rate and rhythm, no murmurs appreciated Pulmonary/Chest: Clear to auscultation bilaterally, no wheezes or rails Abdominal: Soft.  no distension.  no tenderness.  Musculoskeletal: Normal range of motion Neurological:  normal muscle tone. Coordination normal. No atrophy Skin: Skin warm and dry Psychiatric: normal affect, pleasant   ASSESSMENT & PLAN:    preop neck surgery: Acceptable risk, Holding eliquis 3 days  Surgery 2 weeks  Chronic systolic heart failure (HCC) Euvolemic, lasix daily with extra as needed for leg swelling  Paroxysmal atrial fibrillation (HCC) Maintaining normal sinus rhythm,stay on metolol, eliquis, amio  Essential hypertension Labile pressure, low diastolic, Dizzy sometimes in the AM (over diuresis?) Stay on current regimen  PAD (peripheral artery disease) (HCC) Carotid bruit on left, U/s 01/2018 no stenosis  SVT (supraventricular tachycardia) (HCC) No tachycardia only rare palpitation stable  Mixed hyperlipidemia Cholesterol is at goal on the current lipid regimen. No changes to the medications were made.  pulmonary hypertension, unspecified ([place]) treated with Lasix No SOB  CKD (chronic kidney disease), stage III ([place]) Normal numbers 05/2019, GFR 60   Total encounter time more than 25 minutes  Greater than 50% was spent in counseling and coordination of care with the patient    Disposition: Follow-up in 12 months   Signed, Ida Rogue, MD  11/05/2019 8:15 AM    [place] Office [address], [place], [place] [address]

## 2019-11-05 ENCOUNTER — Ambulatory Visit (INDEPENDENT_AMBULATORY_CARE_PROVIDER_SITE_OTHER): Payer: Medicare Other | Admitting: Cardiovascular Disease

## 2019-11-05 ENCOUNTER — Other Ambulatory Visit: Payer: Self-pay

## 2019-11-05 ENCOUNTER — Encounter: Payer: Self-pay | Admitting: Cardiovascular Disease

## 2019-11-05 VITALS — BP 155/55 | HR 59 | Temp 98.2°F | Ht 66.0 in | Wt 130.5 lb

## 2019-11-05 DIAGNOSIS — I739 Peripheral vascular disease, unspecified: Secondary | ICD-10-CM

## 2019-11-05 DIAGNOSIS — I5042 Chronic combined systolic (congestive) and diastolic (congestive) heart failure: Secondary | ICD-10-CM

## 2019-11-05 DIAGNOSIS — E782 Mixed hyperlipidemia: Secondary | ICD-10-CM

## 2019-11-05 DIAGNOSIS — I4819 Other persistent atrial fibrillation: Secondary | ICD-10-CM | POA: Diagnosis not present

## 2019-11-05 DIAGNOSIS — I272 Pulmonary hypertension, unspecified: Secondary | ICD-10-CM

## 2019-11-05 DIAGNOSIS — I10 Essential (primary) hypertension: Secondary | ICD-10-CM | POA: Diagnosis not present

## 2019-11-05 DIAGNOSIS — I471 Supraventricular tachycardia: Secondary | ICD-10-CM

## 2019-11-05 MED ORDER — POTASSIUM CHLORIDE ER 10 MEQ PO TBCR: 10.0000 meq | EXTENDED_RELEASE_TABLET | ORAL | 3 refills | Status: DC

## 2019-11-05 MED ORDER — FUROSEMIDE 20 MG PO TABS: 20.0000 mg | ORAL_TABLET | ORAL | 3 refills | Status: DC

## 2019-11-05 NOTE — Patient Instructions (Addendum)
Medication Instructions:  Your physician has recommended you make the following change in your medication:  1. TAKE Furosemide (Lasix) 20 mg once daily with extra pill after lunch as needed for swelling. 2. TAKE Potassium 10 mEq once daily with extra pill after lunch as needed with fluid pill for swelling.    Please take lasix 20 mg daily with potassium 10 with extra lasix 20 mg  and potassium 10 after lunch as needed of swelling   If you need a refill on your cardiac medications before your next appointment, please call your pharmacy.    Lab work: No new labs needed   If you have labs (blood work) drawn today and your tests are completely normal, you will receive your results only by: Marland Kitchen MyChart Message (if you have MyChart) OR . A paper copy in the mail If you have any lab test that is abnormal or we need to change your treatment, we will call you to review the results.   Testing/Procedures: No new testing needed   Follow-Up: At [facility], you and your health needs are our priority.  As part of our continuing mission to provide you with exceptional heart care, we have created designated Provider Care Teams.  These Care Teams include your primary Cardiologist (physician) and Advanced Practice Providers (APPs -  Physician Assistants and Nurse Practitioners) who all work together to provide you with the care you need, when you need it.  . You will need a follow up appointment in 12 months .   Please call our office 2 months in advance to schedule this appointment.    . Providers on your designated Care Team:   . Murray Hodgkins, NP . Christell Faith, PA-C . Marrianne Mood, PA-C  Any Other Special Instructions Will Be Listed Below (If Applicable).  For educational health videos Log in to : [email or web address] Or : [email or web address], password : [email or web address]

## 2019-11-05 NOTE — Addendum Note (Signed)
Addended by: Valora Corporal on: 11/05/2019 08:59 AM   Modules accepted: Orders

## 2019-11-08 ENCOUNTER — Other Ambulatory Visit: Payer: Self-pay | Admitting: Family Medicine

## 2019-11-08 DIAGNOSIS — F411 Generalized anxiety disorder: Secondary | ICD-10-CM

## 2019-11-14 ENCOUNTER — Other Ambulatory Visit: Payer: Self-pay | Admitting: Family Medicine

## 2019-11-19 ENCOUNTER — Other Ambulatory Visit: Payer: Self-pay | Admitting: Family Medicine

## 2019-11-19 DIAGNOSIS — M47812 Spondylosis without myelopathy or radiculopathy, cervical region: Secondary | ICD-10-CM | POA: Diagnosis not present

## 2019-11-19 DIAGNOSIS — F5102 Adjustment insomnia: Secondary | ICD-10-CM

## 2019-11-19 NOTE — Telephone Encounter (Signed)
Review for controlled medication Temazepam.

## 2019-11-26 DIAGNOSIS — M47812 Spondylosis without myelopathy or radiculopathy, cervical region: Secondary | ICD-10-CM | POA: Diagnosis not present

## 2019-12-03 ENCOUNTER — Other Ambulatory Visit: Payer: Self-pay

## 2019-12-03 ENCOUNTER — Encounter: Payer: Self-pay | Admitting: Family Medicine

## 2019-12-03 ENCOUNTER — Ambulatory Visit (INDEPENDENT_AMBULATORY_CARE_PROVIDER_SITE_OTHER): Payer: Medicare Other | Admitting: Family Medicine

## 2019-12-03 VITALS — BP 148/82 | HR 60 | Temp 96.9°F | Resp 16 | Ht 66.0 in | Wt 128.8 lb

## 2019-12-03 DIAGNOSIS — E785 Hyperlipidemia, unspecified: Secondary | ICD-10-CM | POA: Diagnosis not present

## 2019-12-03 DIAGNOSIS — M542 Cervicalgia: Secondary | ICD-10-CM

## 2019-12-03 DIAGNOSIS — F411 Generalized anxiety disorder: Secondary | ICD-10-CM

## 2019-12-03 DIAGNOSIS — I779 Disorder of arteries and arterioles, unspecified: Secondary | ICD-10-CM

## 2019-12-03 DIAGNOSIS — I10 Essential (primary) hypertension: Secondary | ICD-10-CM

## 2019-12-03 DIAGNOSIS — R739 Hyperglycemia, unspecified: Secondary | ICD-10-CM

## 2019-12-03 DIAGNOSIS — E039 Hypothyroidism, unspecified: Secondary | ICD-10-CM

## 2019-12-03 DIAGNOSIS — I4891 Unspecified atrial fibrillation: Secondary | ICD-10-CM

## 2019-12-03 DIAGNOSIS — D692 Other nonthrombocytopenic purpura: Secondary | ICD-10-CM

## 2019-12-03 DIAGNOSIS — I272 Pulmonary hypertension, unspecified: Secondary | ICD-10-CM

## 2019-12-03 DIAGNOSIS — I471 Supraventricular tachycardia: Secondary | ICD-10-CM

## 2019-12-03 DIAGNOSIS — I482 Chronic atrial fibrillation, unspecified: Secondary | ICD-10-CM

## 2019-12-03 DIAGNOSIS — G8929 Other chronic pain: Secondary | ICD-10-CM

## 2019-12-03 MED ORDER — APIXABAN 5 MG PO TABS: 5.0000 mg | ORAL_TABLET | Freq: Two times a day (BID) | ORAL | 5 refills | Status: DC

## 2019-12-03 NOTE — Progress Notes (Signed)
Name: [name]   MRN: [id number]    DOB: 31-May-1941   Date:12/03/2019       Progress Note  Subjective  Chief Complaint  Chief Complaint  Patient presents with  . Hypertension  . Dyslipidemia  . Chronic Kidney Disease    HPI  Carotid calcification: found on x-ray of neck. She also had a vascular study done back in 2011 that showed mild plaque formationon right.Sheis now on Crestor also on  Eliquis. Off aspirin since ablation 04/2018. She denies headache or dizziness or double vision, she still has intermittent  balance problems - but states secondary to visual disturbance ( she can see better from left eye than right). She went back to Dr. Erven Colla and had ABI that was normal, carotid doppler was due in 2021, but Dr. Rockey Situ told her not to worry about doing it at this time  Chronic neck pain Cervical radiculitis:she is now going to [facility] under the care of Dr. Larose Hires, also had injections done by Dr. Orpah Melter, last visit was one week ago and had ablation on the right side, and the week before ablation on the left side  she denies any tingling or numbness at this time, she states since the ablation she noticed worsening of pain, but is gradually getting better. Pain level today is 7/10 described as a soreness and sometimes a sharp sensation, feels swollen to her   HTN: bp at homeis usually controlled , 130's/60's sometimes it goes down to 100 at times , but she states when she drinks water right away it gets better. No chest pain but has occasional palpitation - about once or twice a week.   Afib: she had a cardioversion in June 2019  and another one in July2019,she has intermittent palpitationat most once or twice a week, but denies associated chest pain episodes lasts less than one minute rate controlled with amiodarone and metoprolol, but down to one pill daily because it was causing bradycarida.Still doing well   CHF chronic: she is down to one pillow at  night.She has mild sob with moderate activity- like when doing yard work, no chest pain, she has intermittent lower extremity edema and is taking extra lasix and potassium at lunch prn as instructed by Dr. Rockey Situ .On ARB , beta blocker and Eliquis   Senile Purpura: both arms and legs, she was given reassurance   GAD: doing well on Buspar twice daily, she also takes temazepam qhs prn and it helps her sleep,she states taking Temazepam at most once a week. Stable at this time  Hypothyroidism: doing well on medication, denies hair loss, denies constipationor difficulty swallowing, we will recheck labs today   Patient Active Problem List   Diagnosis Date Noted  . Chronic heart failure ([place]) 05/24/2018  . Atrial fibrillation ([place]) 05/24/2018  . CKD (chronic kidney disease), stage III 05/17/2018  . 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 Shortness of breath 05/07/2018  . Pulmonary hypertension, unspecified ([place]) 04/05/2018  . Chronic combined systolic (congestive) and diastolic (congestive) heart failure ([place]) 04/05/2018  . Paroxysmal sinus tachycardia ([place]) 01/29/2018  . Leg pain 01/04/2018  . PAD (peripheral artery disease) ([place]) 01/04/2018  . Carotid artery calcification, bilateral 12/19/2017  . Cervical radiculitis 12/19/2017  . Hyperglycemia 03/06/2016  . Hypothyroid 04/21/2015  . 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 Anxiety 04/21/2015  . Hyperlipidemia 04/21/2015  . Diuretic-induced hypokalemia 04/21/2015  . Palpitations 12/18/2012  . Hypertension 05/27/2012     Tobacco Use:  Low Risk   . Smoking Tobacco Use: Never Smoker  . Smokeless Tobacco Use: Never Used    Past Surgical History:  Procedure Laterality Date  . CARDIOVERSION N/A 03/26/2018   Procedure: CARDIOVERSION;  Surgeon: Nelva Bush, MD;  Location: [facility] ORS;  Service: Cardiovascular;  Laterality: N/A;  . CARDIOVERSION N/A 04/24/2018   Procedure: CARDIOVERSION;  Surgeon: Minna Merritts, MD;  Location: [facility] ORS;  Service: Cardiovascular;  Laterality: N/A;  . ELECTROPHYSIOLOGY STUDY N/A 02/27/2012   Procedure: ELECTROPHYSIOLOGY STUDY;  Surgeon: Evans Lance, MD;  Location: [facility] CATH LAB;  Service: Cardiovascular;  Laterality: N/A;  . EPS and ablation for SVT  5/13   slow pathway ablation by Dr Lovena Le  . EYE SURGERY     surgery for detatched retina  . SUPRAVENTRICULAR TACHYCARDIA ABLATION N/A 02/27/2012   Procedure: SUPRAVENTRICULAR TACHYCARDIA ABLATION;  Surgeon: Evans Lance, MD;  Location: [facility] CATH LAB;  Service: Cardiovascular;  Laterality: N/A;    Family History  Problem Relation Age of Onset  . Alzheimer's disease Mother   . Stroke Father   . Breast cancer Father   . Heart disease Father        Pacemaker  . Breast cancer Paternal Aunt   . Kidney cancer Maternal Grandmother   . Alzheimer's disease Maternal Grandfather   . Thyroid disease Paternal Grandmother   . Stroke Paternal Grandfather        Current Outpatient Medications:  .  acetaminophen (TYLENOL) 650 MG CR tablet, Take 1,300 mg by mouth every 8 (eight) hours as needed for pain., Disp: , Rfl:  .  amiodarone (PACERONE) 200 MG tablet, Take 1 tablet (200 mg total) by mouth daily., Disp: 90 tablet, Rfl: 1 .  amLODipine (NORVASC) 10 MG tablet, Take 1 tablet (10 mg total) by mouth daily., Disp: 90 tablet, Rfl: 1 .  apixaban (ELIQUIS) 5 MG TABS tablet, Take 1 tablet (5 mg total) by mouth 2 (two) times daily., Disp: 60 tablet, Rfl: 5 .  busPIRone (BUSPAR) 5 MG tablet, TAKE 1 TABLET(5 MG) BY MOUTH TWICE DAILY, Disp: 180 tablet, Rfl: 0 .  cholecalciferol (VITAMIN D) 1000 UNITS tablet, Take 2,000 Units by mouth daily., Disp: , Rfl:  .  cyclobenzaprine (FLEXERIL) 10 MG tablet, Take 10 mg by mouth 3 (three) times daily as needed., Disp: , Rfl:  .  doxazosin (CARDURA) 1 MG tablet, TAKE 1 TABLET(1 MG) BY MOUTH TWICE DAILY, Disp: 180 tablet, Rfl: 0 .  furosemide (LASIX) 20 MG tablet, Take 1 tablet (20 mg  total) by mouth as directed. Take once daily with extra pill after lunch as needed for swelling., Disp: 180 tablet, Rfl: 3 .  HYDROcodone-acetaminophen (NORCO/VICODIN) 5-325 MG tablet, Take 1 tablet by mouth every 6 (six) hours as needed., Disp: , Rfl:  .  levothyroxine (SYNTHROID) 137 MCG tablet, TAKE 1 TABLET(137 MCG) BY MOUTH EVERY MORNING, Disp: 90 tablet, Rfl: 0 .  metoprolol succinate (TOPROL-XL) 50 MG 24 hr tablet, Take with or immediately following a meal., Disp: 90 tablet, Rfl: 0 .  Multiple Vitamin (MULITIVITAMIN WITH MINERALS) TABS, Take 1 tablet by mouth daily., Disp: , Rfl:  .  potassium chloride (KLOR-CON) 10 MEQ tablet, Take 1 tablet (10 mEq total) by mouth as directed. Take once daily with extra pill after lunch as needed with fluid pill for swelling., Disp: 90 tablet, Rfl: 3 .  rosuvastatin (CRESTOR) 40 MG tablet, TAKE 1 TABLET(40 MG) BY MOUTH DAILY, Disp: 90 tablet, Rfl: 3 .  temazepam (RESTORIL) 15  MG capsule, TAKE 1 CAPSULE(15 MG) BY MOUTH AT BEDTIME AS NEEDED FOR SLEEP, Disp: 30 capsule, Rfl: 0 .  tiZANidine (ZANAFLEX) 4 MG tablet, , Disp: , Rfl:  .  valsartan (DIOVAN) 320 MG tablet, TAKE 1 TABLET(320 MG) BY MOUTH DAILY, Disp: 90 tablet, Rfl: 3 .  Vitamins/Minerals TABS, Take by mouth., Disp: , Rfl:   Allergies  Allergen Reactions  . Codeine Nausea And Vomiting  . Erythromycin Itching and Swelling  . Septra [Sulfamethoxazole-Trimethoprim] Swelling  . Penicillins Swelling and Rash    Has patient had a PCN reaction causing immediate rash, facial/tongue/throat swelling, SOB or lightheadedness with hypotension: Yes Has patient had a PCN reaction causing severe rash involving mucus membranes or skin necrosis: No Has patient had a PCN reaction that required hospitalization: No Has patient had a PCN reaction occurring within the last 10 years: No If all of the above answers are "NO", then may proceed with Cephalosporin use.     I personally reviewed active problem list,  medication list, allergies, family history, social history, health maintenance with the patient/caregiver today.   ROS  Constitutional: Negative for fever or weight change.  Respiratory: Negative for cough and shortness of breath.   Cardiovascular: Negative for chest pain , positive for intermittent palpitations.  Gastrointestinal: Negative for abdominal pain, no bowel changes.  Musculoskeletal: Negative for gait problem or joint swelling.  Skin: Negative for rash.  Neurological: Negative for dizziness or headache.  No other specific complaints in a complete review of systems (except as listed in HPI above).  Objective  Vitals:   12/03/19 0807 12/03/19 0849  BP: (!) 180/70 (!) 148/82  Pulse: 60   Resp: 16   Temp: (!) 96.9 F (36.1 C)   TempSrc: Temporal   SpO2: 98%   Weight: 128 lb 12.8 oz (58.4 kg)   Height: 5\' 6"  (1.676 m)     Body mass index is 20.79 kg/m.  Physical Exam  Constitutional: Patient appears well-developed and well-nourished.  No distress.  HEENT: head atraumatic, normocephalic, pupils equal and reactive to light Cardiovascular: Normal rate, regular rhythm and normal heart sounds.  No murmur heard. No BLE edema. Pulmonary/Chest: Effort normal and breath sounds normal. No respiratory distress. Abdominal: Soft.  There is no tenderness. Psychiatric: Patient has a normal mood and affect. behavior is normal. Judgment and thought content normal.  PHQ2/9: Depression screen [facility] 2/9 12/03/2019 06/02/2019 05/23/2019 01/31/2019 07/02/2018  Decreased Interest 0 0 0 0 0  Down, Depressed, Hopeless 0 0 0 - 1  PHQ - 2 Score 0 0 0 0 1  Altered sleeping 0 0 - 1 -  Tired, decreased energy 0 0 - 1 -  Change in appetite 0 0 - 0 -  Feeling bad or failure about yourself  0 0 - 0 -  Trouble concentrating 0 0 - 1 -  Moving slowly or fidgety/restless 0 0 - 0 -  Suicidal thoughts 0 0 - 0 -  PHQ-9 Score 0 0 - 3 -  Difficult doing work/chores - - - Not difficult at all -    phq 9  is negative   Fall Risk: Fall Risk  12/03/2019 06/02/2019 05/23/2019 01/31/2019 12/31/2018  Falls in the past year? 0 0 0 0 0  Number falls in past yr: 0 0 0 0 0  Injury with Fall? 0 0 0 0 0  Comment - - - - -  Risk for fall due to : - - Impaired vision - -  Risk for  fall due to: Comment - - - - -  Follow up - - Falls prevention discussed - -     Functional Status Survey: Is the patient deaf or have difficulty hearing?: No Does the patient have difficulty seeing, even when wearing glasses/contacts?: No Does the patient have difficulty concentrating, remembering, or making decisions?: No Does the patient have difficulty walking or climbing stairs?: No Does the patient have difficulty dressing or bathing?: No Does the patient have difficulty doing errands alone such as visiting a doctor's office or shopping?: No    Assessment & Plan  1. Dyslipidemia  On medication, last LDL at goal   2. Pulmonary hypertension ([place])  Monitored by Dr. Rockey Situ   3. Hypothyroidism, unspecified type  Takes medication as prescribed   4. SVT (supraventricular tachycardia) ([place])   5. Essential hypertension - white coat   With white coat component   6. Senile purpura (HCC)  stable  7. Hyperglycemia  Last level was normal   8. GAD (generalized anxiety disorder)   9. Carotid artery disease without cerebral infarction ([place])   10. Chronic a-fib (HCC)  - apixaban (ELIQUIS) 5 MG TABS tablet; Take 1 tablet (5 mg total) by mouth 2 (two) times daily.  Dispense: 60 tablet; Refill: 5  11. Chronic neck pain  Keep follow up with pain management /neurosurgery

## 2019-12-04 ENCOUNTER — Other Ambulatory Visit: Payer: Self-pay | Admitting: Family Medicine

## 2019-12-04 DIAGNOSIS — E039 Hypothyroidism, unspecified: Secondary | ICD-10-CM

## 2019-12-25 ENCOUNTER — Telehealth: Payer: Self-pay | Admitting: Cardiovascular Disease

## 2019-12-25 DIAGNOSIS — M4802 Spinal stenosis, cervical region: Secondary | ICD-10-CM | POA: Diagnosis not present

## 2019-12-25 DIAGNOSIS — M5416 Radiculopathy, lumbar region: Secondary | ICD-10-CM | POA: Diagnosis not present

## 2019-12-25 DIAGNOSIS — M47812 Spondylosis without myelopathy or radiculopathy, cervical region: Secondary | ICD-10-CM | POA: Diagnosis not present

## 2019-12-25 NOTE — Telephone Encounter (Signed)
   Primary Cardiologist: Ida Rogue, MD  Chart reviewed as part of pre-operative protocol coverage. Patient was contacted 12/25/2019 in reference to pre-operative risk assessment for pending surgery as outlined below.  [name] was last seen on 11/05/2019 by Dr. Rockey Situ.  Since that day, [name] has done well without chest pain or shortness of breath.  Therefore, based on ACC/AHA guidelines, the patient would be at acceptable risk for the planned procedure without further cardiovascular testing.   I will route this recommendation to the requesting party via Epic fax function and remove from pre-op pool.  Please call with questions. She has been instructed to hold Eliquis for 3 days prior to the procedure and restart as soon as possible after the procedure at the discretion of the surgeon based on the bleeding risk.   Almyra Deforest, [place] 12/25/2019, 5:31 PM

## 2019-12-25 NOTE — Telephone Encounter (Signed)
Patient with diagnosis of afib on Eliquis for anticoagulation.    Procedure: Cervical spine injection Date of procedure: TBD  CHADS2-VASc score of  6 (CHF, HTN, AGE, CAD, AGE, female)  CrCl 46 ml/min  Per office protocol, patient can hold Eliquis for 3 days prior to procedure.

## 2019-12-25 NOTE — Telephone Encounter (Signed)
   [facility] Pre-operative Risk Assessment    Request for surgical clearance:  1. What type of surgery is being performed? Cervical spine injection  2. When is this surgery scheduled? tbd  3. What type of clearance is required (medical clearance vs. Pharmacy clearance to hold med vs. Both)? Not listed  4. Are there any medications that need to be held prior to surgery and how long?   5. Practice name and name of physician performing surgery? [facility]  6. What is your office phone number? Not listed   7.   What is your office fax number [phone]  8.   Anesthesia type (None, local, MAC, general) ? Not listed  Lucienne Minks 12/25/2019, 12:06 PM  _________________________________________________________________   (provider comments below)

## 2019-12-25 NOTE — Telephone Encounter (Signed)
Clinical pharmacist to review Eliquis 

## 2019-12-29 DIAGNOSIS — L570 Actinic keratosis: Secondary | ICD-10-CM | POA: Diagnosis not present

## 2019-12-29 DIAGNOSIS — L578 Other skin changes due to chronic exposure to nonionizing radiation: Secondary | ICD-10-CM | POA: Diagnosis not present

## 2019-12-29 DIAGNOSIS — Z85828 Personal history of other malignant neoplasm of skin: Secondary | ICD-10-CM | POA: Diagnosis not present

## 2019-12-29 DIAGNOSIS — C44619 Basal cell carcinoma of skin of left upper limb, including shoulder: Secondary | ICD-10-CM | POA: Diagnosis not present

## 2019-12-29 DIAGNOSIS — D485 Neoplasm of uncertain behavior of skin: Secondary | ICD-10-CM | POA: Diagnosis not present

## 2019-12-29 DIAGNOSIS — Z872 Personal history of diseases of the skin and subcutaneous tissue: Secondary | ICD-10-CM | POA: Diagnosis not present

## 2020-01-10 ENCOUNTER — Other Ambulatory Visit: Payer: Self-pay | Admitting: Cardiovascular Disease

## 2020-01-11 ENCOUNTER — Other Ambulatory Visit: Payer: Self-pay | Admitting: Family Medicine

## 2020-01-11 ENCOUNTER — Other Ambulatory Visit: Payer: Self-pay | Admitting: Cardiovascular Disease

## 2020-01-11 DIAGNOSIS — F5102 Adjustment insomnia: Secondary | ICD-10-CM

## 2020-01-11 NOTE — Telephone Encounter (Signed)
Requested medication (s) are due for refill today  YES  Requested medication (s) are on the active medication list  YES  Future visit scheduled  YES on 04/02/20  Last ordered on 11/22/19  Note to clinic:  Non delegated medication. Routing to provider for consideration.       Requested Prescriptions  Pending Prescriptions Disp Refills   temazepam (RESTORIL) 15 MG capsule [Pharmacy Med Name: TEMAZEPAM 15MG  CAPSULES] 30 capsule     Sig: TAKE 1 CAPSULE(15 MG) BY MOUTH AT BEDTIME AS NEEDED FOR SLEEP      Not Delegated - Psychiatry:  Anxiolytics/Hypnotics Failed - 01/11/2020  2:28 PM      Failed - This refill cannot be delegated      Failed - Urine Drug Screen completed in last 360 days.      Passed - Valid encounter within last 6 months    Recent Outpatient Visits           1 month ago Dyslipidemia   [facility] Steele Sizer, MD   7 months ago Dyslipidemia   [facility] Steele Sizer, MD   11 months ago Essential hypertension   [facility] Steele Sizer, MD   1 year ago CKD (chronic kidney disease), stage III [facility])   [facility] Steele Sizer, MD   1 year ago Hospital discharge follow-up   [facility] Steele Sizer, MD       Future Appointments             In 2 months Ancil Boozer, Drue Stager, MD [facility] [place] [place]   In 4 months  [facility], [facility]

## 2020-01-12 ENCOUNTER — Other Ambulatory Visit: Payer: Self-pay

## 2020-01-12 MED ORDER — METOPROLOL SUCCINATE ER 50 MG PO TB24: ORAL_TABLET | ORAL | 0 refills | Status: DC

## 2020-01-15 DIAGNOSIS — C4491 Basal cell carcinoma of skin, unspecified: Secondary | ICD-10-CM | POA: Diagnosis not present

## 2020-01-15 DIAGNOSIS — C44619 Basal cell carcinoma of skin of left upper limb, including shoulder: Secondary | ICD-10-CM | POA: Diagnosis not present

## 2020-01-20 DIAGNOSIS — M4802 Spinal stenosis, cervical region: Secondary | ICD-10-CM | POA: Diagnosis not present

## 2020-01-27 ENCOUNTER — Other Ambulatory Visit: Payer: Self-pay

## 2020-01-27 MED ORDER — AMLODIPINE BESYLATE 10 MG PO TABS: 10.0000 mg | ORAL_TABLET | Freq: Every day | ORAL | 3 refills | Status: DC

## 2020-02-02 ENCOUNTER — Ambulatory Visit (INDEPENDENT_AMBULATORY_CARE_PROVIDER_SITE_OTHER): Payer: Medicare Other | Admitting: Family Medicine

## 2020-02-02 ENCOUNTER — Encounter: Payer: Self-pay | Admitting: Family Medicine

## 2020-02-02 ENCOUNTER — Other Ambulatory Visit: Payer: Self-pay

## 2020-02-02 VITALS — BP 134/82 | HR 68 | Temp 97.1°F | Resp 16 | Ht 66.0 in | Wt 127.8 lb

## 2020-02-02 DIAGNOSIS — N1831 Chronic kidney disease, stage 3a: Secondary | ICD-10-CM | POA: Diagnosis not present

## 2020-02-02 DIAGNOSIS — R300 Dysuria: Secondary | ICD-10-CM | POA: Diagnosis not present

## 2020-02-02 DIAGNOSIS — R350 Frequency of micturition: Secondary | ICD-10-CM

## 2020-02-02 LAB — POCT URINALYSIS DIPSTICK
Bilirubin, UA: NEGATIVE
Blood, UA: NEGATIVE
Glucose, UA: NEGATIVE
Ketones, UA: NEGATIVE
Leukocytes, UA: NEGATIVE
Nitrite, UA: NEGATIVE
Protein, UA: POSITIVE — AB
Spec Grav, UA: 1.015 (ref 1.010–1.025)
Urobilinogen, UA: 0.2 E.U./dL
pH, UA: 5 (ref 5.0–8.0)

## 2020-02-02 NOTE — Progress Notes (Signed)
Name: [name]   MRN: [id number]    DOB: [birth year]   Date:02/02/2020       Progress Note  Subjective  Chief Complaint  Chief Complaint  Patient presents with  . Dysuria    Burning and specks of blood in her urine  . Urinary Frequency    HPI  Dysuria: she has a history of acute cystitis that required her to be hospitalized with acute kidney failure. She states symptoms started gradually about two weeks ago. Last Wednesday symptoms were worse with nocturia all night, dysuria, and some blood when she wiped. She had a history of kidney stones in her kidney, never passed it before. Since last week she is back to normal. No other problems. She came in because of her history of hospitalization for cystitis. She had some leg swelling, but has resolved now   Neck pain: she is under the care of Dr. Larose Hires and will have surgery soon. She has daily pain, sometimes has tingling on her arms. She states it got worse since ablation given by Dr. Brien Few.   Patient Active Problem List   Diagnosis Date Noted  . Chronic heart failure ([place]) 05/24/2018  . Atrial fibrillation ([place]) 05/24/2018  . CKD (chronic kidney disease), stage III 05/17/2018  . 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 Shortness of breath 05/07/2018  . Pulmonary hypertension, unspecified ([place]) 04/05/2018  . Chronic combined systolic (congestive) and diastolic (congestive) heart failure ([place]) 04/05/2018  . Paroxysmal sinus tachycardia ([place]) 01/29/2018  . Leg pain 01/04/2018  . PAD (peripheral artery disease) ([place]) 01/04/2018  . Carotid artery calcification, bilateral 12/19/2017  . Cervical radiculitis 12/19/2017  . Hyperglycemia 03/06/2016  . Hypothyroid 04/21/2015  . 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 Anxiety 04/21/2015  . Hyperlipidemia 04/21/2015  . Diuretic-induced hypokalemia 04/21/2015  . Palpitations 12/18/2012  . Hypertension 05/27/2012    Past Surgical History:  Procedure Laterality  Date  . CARDIOVERSION N/A 03/26/2018   Procedure: CARDIOVERSION;  Surgeon: Nelva Bush, MD;  Location: [facility] ORS;  Service: Cardiovascular;  Laterality: N/A;  . CARDIOVERSION N/A 04/24/2018   Procedure: CARDIOVERSION;  Surgeon: Minna Merritts, MD;  Location: [facility] ORS;  Service: Cardiovascular;  Laterality: N/A;  . ELECTROPHYSIOLOGY STUDY N/A 02/27/2012   Procedure: ELECTROPHYSIOLOGY STUDY;  Surgeon: Evans Lance, MD;  Location[facility] CATH LAB;  Service: Cardiovascular;  Laterality: N/A;  . EPS and ablation for SVT  5/13   slow pathway ablation by Dr Lovena Le  . EYE SURGERY     surgery for detatched retina  . SUPRAVENTRICULAR TACHYCARDIA ABLATION N/A 02/27/2012   Procedure: SUPRAVENTRICULAR TACHYCARDIA ABLATION;  Surgeon: Evans Lance, MD;  Location: [facility] CATH LAB;  Service: Cardiovascular;  Laterality: N/A;    Family History  Problem Relation Age of Onset  . Alzheimer's disease Mother   . Stroke Father   . Breast cancer Father   . Heart disease Father        Pacemaker  . Breast cancer Paternal Aunt   . Kidney cancer Maternal Grandmother   . Alzheimer's disease Maternal Grandfather   . Thyroid disease Paternal Grandmother   . Stroke Paternal Grandfather     Social History   Tobacco Use  . Smoking status: Never Smoker  . Smokeless tobacco: Never Used  . Tobacco comment: smoking cessation materials not required  Substance Use Topics  . Alcohol use: No     Current Outpatient Medications:  .  acetaminophen (TYLENOL) 650 MG CR tablet, Take 1,300  mg by mouth every 8 (eight) hours as needed for pain., Disp: , Rfl:  .  amiodarone (PACERONE) 200 MG tablet, TAKE 1 TABLET(200 MG) BY MOUTH DAILY, Disp: 90 tablet, Rfl: 1 .  amLODipine (NORVASC) 10 MG tablet, Take 1 tablet (10 mg total) by mouth daily., Disp: 90 tablet, Rfl: 3 .  apixaban (ELIQUIS) 5 MG TABS tablet, Take 1 tablet (5 mg total) by mouth 2 (two) times daily., Disp: 60 tablet, Rfl: 5 .  busPIRone (BUSPAR) 5 MG tablet, TAKE 1  TABLET(5 MG) BY MOUTH TWICE DAILY, Disp: 180 tablet, Rfl: 0 .  cholecalciferol (VITAMIN D) 1000 UNITS tablet, Take 2,000 Units by mouth daily., Disp: , Rfl:  .  cyclobenzaprine (FLEXERIL) 10 MG tablet, Take 10 mg by mouth 3 (three) times daily as needed., Disp: , Rfl:  .  furosemide (LASIX) 20 MG tablet, Take 1 tablet (20 mg total) by mouth as directed. Take once daily with extra pill after lunch as needed for swelling., Disp: 180 tablet, Rfl: 3 .  levothyroxine (SYNTHROID) 137 MCG tablet, TAKE 1 TABLET(137 MCG) BY MOUTH EVERY MORNING, Disp: 90 tablet, Rfl: 1 .  metoprolol succinate (TOPROL-XL) 50 MG 24 hr tablet, TAKE 1 TABLET BY MOUTH TWICE DAILY WITH OR IMMEDIATELY FOLLOWING MEALS, Disp: 180 tablet, Rfl: 0 .  metoprolol succinate (TOPROL-XL) 50 MG 24 hr tablet, Take with or immediately following a meal., Disp: 90 tablet, Rfl: 0 .  Multiple Vitamin (MULITIVITAMIN WITH MINERALS) TABS, Take 1 tablet by mouth daily., Disp: , Rfl:  .  potassium chloride (KLOR-CON) 10 MEQ tablet, Take 1 tablet (10 mEq total) by mouth as directed. Take once daily with extra pill after lunch as needed with fluid pill for swelling., Disp: 90 tablet, Rfl: 3 .  rosuvastatin (CRESTOR) 40 MG tablet, TAKE 1 TABLET(40 MG) BY MOUTH DAILY, Disp: 90 tablet, Rfl: 3 .  temazepam (RESTORIL) 15 MG capsule, TAKE 1 CAPSULE(15 MG) BY MOUTH AT BEDTIME AS NEEDED FOR SLEEP, Disp: 30 capsule, Rfl: 2 .  tiZANidine (ZANAFLEX) 4 MG tablet, , Disp: , Rfl:  .  valsartan (DIOVAN) 320 MG tablet, TAKE 1 TABLET(320 MG) BY MOUTH DAILY, Disp: 90 tablet, Rfl: 3 .  Vitamins/Minerals TABS, Take by mouth., Disp: , Rfl:  .  doxazosin (CARDURA) 1 MG tablet, TAKE 1 TABLET(1 MG) BY MOUTH TWICE DAILY, Disp: 180 tablet, Rfl: 0 .  HYDROcodone-acetaminophen (NORCO/VICODIN) 5-325 MG tablet, Take 1 tablet by mouth every 6 (six) hours as needed., Disp: , Rfl:   Allergies  Allergen Reactions  . Codeine Nausea And Vomiting  . Erythromycin Itching and Swelling  .  Septra [Sulfamethoxazole-Trimethoprim] Swelling  . Penicillins Swelling and Rash    Has patient had a PCN reaction causing immediate rash, facial/tongue/throat swelling, SOB or lightheadedness with hypotension: Yes Has patient had a PCN reaction causing severe rash involving mucus membranes or skin necrosis: No Has patient had a PCN reaction that required hospitalization: No Has patient had a PCN reaction occurring within the last 10 years: No If all of the above answers are "NO", then may proceed with Cephalosporin use.     I personally reviewed active problem list, medication list, allergies, family history, social history, health maintenance with the patient/caregiver today.   ROS  Ten systems reviewed and is negative except as mentioned in HPI   Objective  Vitals:   02/02/20 1358  BP: 134/82  Pulse: 68  Resp: 16  Temp: (!) 97.1 F (36.2 C)  TempSrc: Temporal  SpO2: 98%  Weight:  127 lb 12.8 oz (58 kg)  Height: 5\' 6"  (1.676 m)    Body mass index is 20.63 kg/m.  Physical Exam  Constitutional: Patient appears well-developed and well-nourishedNo distress.  HEENT: head atraumatic, normocephalic, pupils equal and reactive to light Cardiovascular: Normal rate, regular rhythm and normal heart sounds.  No murmur heard. No BLE edema. Pulmonary/Chest: Effort normal and breath sounds normal. No respiratory distress. Abdominal: Soft.  There is no tenderness. Psychiatric: Patient has a normal mood and affect. behavior is normal. Judgment and thought content normal.  Recent Results (from the past 2160 hour(s))  POCT urinalysis dipstick     Status: Abnormal   Collection Time: 02/02/20  2:02 PM  Result Value Ref Range   Color, UA yellow    Clarity, UA clear    Glucose, UA Negative Negative   Bilirubin, UA neg    Ketones, UA neg    Spec Grav, UA 1.015 1.010 - 1.025   Blood, UA neg    pH, UA 5.0 5.0 - 8.0   Protein, UA Positive (A) Negative   Urobilinogen, UA 0.2 0.2 or 1.0  E.U./dL   Nitrite, UA negative    Leukocytes, UA Negative Negative   Appearance clear    Odor none       PHQ2/9: Depression screen [facility] 2/9 02/02/2020 12/03/2019 06/02/2019 05/23/2019 01/31/2019  Decreased Interest 0 0 0 0 0  Down, Depressed, Hopeless 0 0 0 0 -  PHQ - 2 Score 0 0 0 0 0  Altered sleeping 0 0 0 - 1  Tired, decreased energy 0 0 0 - 1  Change in appetite 0 0 0 - 0  Feeling bad or failure about yourself  0 0 0 - 0  Trouble concentrating 0 0 0 - 1  Moving slowly or fidgety/restless 0 0 0 - 0  Suicidal thoughts 0 0 0 - 0  PHQ-9 Score 0 0 0 - 3  Difficult doing work/chores Not difficult at all - - - Not difficult at all  Some recent data might be hidden    phq 9 is negative   Fall Risk: Fall Risk  02/02/2020 12/03/2019 06/02/2019 05/23/2019 01/31/2019  Falls in the past year? 0 0 0 0 0  Number falls in past yr: 0 0 0 0 0  Injury with Fall? 0 0 0 0 0  Comment - - - - -  Risk for fall due to : - - - Impaired vision -  Risk for fall due to: Comment - - - - -  Follow up - - - Falls prevention discussed -     Functional Status Survey: Is the patient deaf or have difficulty hearing?: No Does the patient have difficulty seeing, even when wearing glasses/contacts?: Yes Does the patient have difficulty concentrating, remembering, or making decisions?: No Does the patient have difficulty walking or climbing stairs?: No Does the patient have difficulty dressing or bathing?: No Does the patient have difficulty doing errands alone such as visiting a doctor's office or shopping?: No    Assessment & Plan  1. Dysuria  - POCT urinalysis dipstick - CULTURE, URINE COMPREHENSIVE - BASIC METABOLIC PANEL WITH GFR  2. Urinary frequency  - POCT urinalysis dipstick - CULTURE, URINE COMPREHENSIVE - BASIC METABOLIC PANEL WITH GFR   3. Stage 3a chronic kidney disease  BMP

## 2020-02-03 LAB — BASIC METABOLIC PANEL WITH GFR
BUN/Creatinine Ratio: 18 (calc) (ref 6–22)
BUN: 22 mg/dL (ref 7–25)
CO2: 26 mmol/L (ref 20–32)
Calcium: 9.6 mg/dL (ref 8.6–10.4)
Chloride: 101 mmol/L (ref 98–110)
Creat: 1.25 mg/dL — ABNORMAL HIGH (ref 0.60–0.93)
GFR, Est African American: 48 mL/min/{1.73_m2} — ABNORMAL LOW (ref >=60)
GFR, Est Non African American: 41 mL/min/{1.73_m2} — ABNORMAL LOW (ref >=60)
Glucose, Bld: 156 mg/dL — ABNORMAL HIGH (ref 65–99)
Potassium: 4.1 mmol/L (ref 3.5–5.3)
Sodium: 135 mmol/L (ref 135–146)

## 2020-02-03 LAB — CULTURE, URINE COMPREHENSIVE
MICRO NUMBER:: 10353362
SPECIMEN QUALITY:: ADEQUATE

## 2020-02-05 ENCOUNTER — Other Ambulatory Visit: Payer: Self-pay | Admitting: Family Medicine

## 2020-02-05 DIAGNOSIS — F411 Generalized anxiety disorder: Secondary | ICD-10-CM

## 2020-02-16 DIAGNOSIS — M4802 Spinal stenosis, cervical region: Secondary | ICD-10-CM | POA: Diagnosis not present

## 2020-02-16 DIAGNOSIS — I10 Essential (primary) hypertension: Secondary | ICD-10-CM | POA: Diagnosis not present

## 2020-02-17 ENCOUNTER — Telehealth: Payer: Self-pay | Admitting: Cardiovascular Disease

## 2020-02-17 NOTE — Telephone Encounter (Signed)
   Primary Cardiologist: Ida Rogue, MD  Chart reviewed as part of pre-operative protocol coverage. Patient was contacted 02/17/2020 in reference to pre-operative risk assessment for pending surgery as outlined below.  [name] was last seen on 11/05/19 by Dr. Rockey Situ.  Since that day, [name] has done well. Getting > 4 mets of activity.   Therefore, based on ACC/AHA guidelines, the patient would be at acceptable risk for the planned procedure without further cardiovascular testing.   Pharmacy to review anticoagulation.   [place], [place] 02/17/2020, 2:36 PM

## 2020-02-17 NOTE — Telephone Encounter (Signed)
   [facility] Pre-operative Risk Assessment    Request for surgical clearance:  What type of surgery is being performed? C5-6 ANTERIOR CERVICAL FUSION 1. When is this surgery scheduled? 02/25/20  What type of clearance is required (medical clearance vs. Pharmacy clearance to hold med vs. Both)? BOTH  2. Are there any medications that need to be held prior to surgery and how long? ELIQUIS  3. Practice name and name of physician performing surgery? [facility], DR Marylyn Ishihara [place]  What is your office phone number [phone]  7.   What is your office fax number [phone]  8.   Anesthesia type (None, local, MAC, general) ? NOT LISTED   [name] 02/17/2020, 10:16 AM  _________________________________________________________________   (provider comments below)

## 2020-02-17 NOTE — Telephone Encounter (Signed)
Patient with diagnosis of afib on Eliquis for anticoagulation.    Procedure: C5-6 ANTERIOR CERVICAL FUSION Date of procedure: 02/25/20  CHADS2-VASc score of  6 (CHF, HTN, AGE, CAD, AGE, female)  CrCl 34 ml/min  Per office protocol, patient can hold Eliquis for 3 days prior to procedure.

## 2020-02-20 ENCOUNTER — Other Ambulatory Visit: Payer: Self-pay

## 2020-02-20 DIAGNOSIS — N183 Chronic kidney disease, stage 3 unspecified: Secondary | ICD-10-CM

## 2020-02-20 DIAGNOSIS — I10 Essential (primary) hypertension: Secondary | ICD-10-CM

## 2020-02-20 MED ORDER — VALSARTAN 320 MG PO TABS: ORAL_TABLET | ORAL | 3 refills | Status: DC

## 2020-02-21 HISTORY — PX: NECK SURGERY: SHX720

## 2020-02-25 DIAGNOSIS — M47812 Spondylosis without myelopathy or radiculopathy, cervical region: Secondary | ICD-10-CM | POA: Diagnosis not present

## 2020-02-25 DIAGNOSIS — M4802 Spinal stenosis, cervical region: Secondary | ICD-10-CM | POA: Diagnosis not present

## 2020-02-25 DIAGNOSIS — M4722 Other spondylosis with radiculopathy, cervical region: Secondary | ICD-10-CM | POA: Diagnosis not present

## 2020-03-11 ENCOUNTER — Emergency Department
Admission: EM | Admit: 2020-03-11 | Discharge: 2020-03-11 | Disposition: A | Discharge status: Home / Self Care | Payer: Medicare Other | Attending: Emergency Medicine | Admitting: Emergency Medicine

## 2020-03-11 ENCOUNTER — Emergency Department: Payer: Medicare Other

## 2020-03-11 ENCOUNTER — Other Ambulatory Visit: Payer: Self-pay

## 2020-03-11 ENCOUNTER — Encounter: Payer: Self-pay | Admitting: Emergency Medicine

## 2020-03-11 DIAGNOSIS — E039 Hypothyroidism, unspecified: Secondary | ICD-10-CM | POA: Insufficient documentation

## 2020-03-11 DIAGNOSIS — N183 Chronic kidney disease, stage 3 unspecified: Secondary | ICD-10-CM | POA: Diagnosis not present

## 2020-03-11 DIAGNOSIS — N12 Tubulo-interstitial nephritis, not specified as acute or chronic: Secondary | ICD-10-CM | POA: Insufficient documentation

## 2020-03-11 DIAGNOSIS — R109 Unspecified abdominal pain: Secondary | ICD-10-CM | POA: Diagnosis present

## 2020-03-11 DIAGNOSIS — I130 Hypertensive heart and chronic kidney disease with heart failure and stage 1 through stage 4 chronic kidney disease, or unspecified chronic kidney disease: Secondary | ICD-10-CM | POA: Insufficient documentation

## 2020-03-11 DIAGNOSIS — Z7901 Long term (current) use of anticoagulants: Secondary | ICD-10-CM | POA: Diagnosis not present

## 2020-03-11 DIAGNOSIS — Z79899 Other long term (current) drug therapy: Secondary | ICD-10-CM | POA: Diagnosis not present

## 2020-03-11 DIAGNOSIS — I5042 Chronic combined systolic (congestive) and diastolic (congestive) heart failure: Secondary | ICD-10-CM | POA: Diagnosis not present

## 2020-03-11 DIAGNOSIS — K8020 Calculus of gallbladder without cholecystitis without obstruction: Secondary | ICD-10-CM | POA: Diagnosis not present

## 2020-03-11 LAB — URINALYSIS, COMPLETE (UACMP) WITH MICROSCOPIC
Bacteria, UA: NONE SEEN
Bilirubin Urine: NEGATIVE
Glucose, UA: NEGATIVE mg/dL
Ketones, ur: NEGATIVE mg/dL
Nitrite: NEGATIVE
Protein, ur: 100 mg/dL — AB
RBC / HPF: >50 RBC/hpf — ABNORMAL HIGH (ref 0–5)
Specific Gravity, Urine: 1.013 (ref 1.005–1.030)
Squamous Epithelial / HPF: NONE SEEN (ref 0–5)
WBC, UA: >50 WBC/hpf — ABNORMAL HIGH (ref 0–5)
pH: 5 (ref 5.0–8.0)

## 2020-03-11 LAB — BASIC METABOLIC PANEL
Anion gap: 10 (ref 5–15)
BUN: 22 mg/dL (ref 8–23)
CO2: 23 mmol/L (ref 22–32)
Calcium: 9.1 mg/dL (ref 8.9–10.3)
Chloride: 104 mmol/L (ref 98–111)
Creatinine, Ser: 1.14 mg/dL — ABNORMAL HIGH (ref 0.44–1.00)
GFR calc Af Amer: 54 mL/min — ABNORMAL LOW (ref >60)
GFR calc non Af Amer: 46 mL/min — ABNORMAL LOW (ref >60)
Glucose, Bld: 162 mg/dL — ABNORMAL HIGH (ref 70–99)
Potassium: 4.3 mmol/L (ref 3.5–5.1)
Sodium: 137 mmol/L (ref 135–145)

## 2020-03-11 LAB — CBC
HCT: 35.9 % — ABNORMAL LOW (ref 36.0–46.0)
Hemoglobin: 12 g/dL (ref 12.0–15.0)
MCH: 32.8 pg (ref 26.0–34.0)
MCHC: 33.4 g/dL (ref 30.0–36.0)
MCV: 98.1 fL (ref 80.0–100.0)
Platelets: 225 10*3/uL (ref 150–400)
RBC: 3.66 MIL/uL — ABNORMAL LOW (ref 3.87–5.11)
RDW: 13.7 % (ref 11.5–15.5)
WBC: 13.1 10*3/uL — ABNORMAL HIGH (ref 4.0–10.5)
nRBC: 0 % (ref 0.0–0.2)

## 2020-03-11 MED ORDER — CIPROFLOXACIN HCL 500 MG PO TABS
500.0000 mg | ORAL_TABLET | Freq: Two times a day (BID) | ORAL | 0 refills | Status: AC
Start: 2020-03-11 — End: 2020-03-21

## 2020-03-11 MED ORDER — CIPROFLOXACIN IN D5W 400 MG/200ML IV SOLN
400.0000 mg | Freq: Once | INTRAVENOUS | Status: AC
  Administered 2020-03-11: 400 mg via INTRAVENOUS
  Filled 2020-03-11: qty 200

## 2020-03-11 NOTE — ED Provider Notes (Signed)
[facility] Emergency Department Provider Note   ____________________________________________    I have reviewed the triage vital signs and the nursing notes.   HISTORY  Chief Complaint Flank Pain     HPI [name] is a 79 y.o. female with a history as noted below who presents with complaints of left-sided flank pain intermittently for several days, worse last night.  She reports urinary frequency but no dysuria.  She has noted hematuria.  No fevers or chills.  No nausea or vomiting.  Is not take anything for this.  Thinks that she may have had a kidney stone several months ago but no imaging or formal diagnosis of that.  Currently is feeling better than she was last night  Past Medical History:  Diagnosis Date  . Anxiety   . Bronchitis   . Chronic combined systolic (congestive) and diastolic (congestive) heart failure ([place])    a. 02/2018 Echo: EF 45-50%, diff HK, triv AI, mod MR, mildly dil LA/RA, nl RV fxn, mod TR. PASP 40-49mmHg.  Marland Kitchen Chronic kidney disease   . DJD (degenerative joint disease), cervical   . History of stress test    a. 01/2008 MV: EF 76%. Fair ex tol. No ischemia.  . Hyperlipidemia   . Hypertension   . Hypothyroid   . LBBB (left bundle branch block)   . Leg pain    a. 01/2018 ABI's wnl.  . Lumbar spondylosis   . Persistent atrial fibrillation ([place])    a. 01/2018 Event monitor: 45 runs of SVT, longest 14.5 sec. SVT felt to be Afib/flutter-->2% burden. Longest run of AF 4h 57m (CHA2DS2VASc = 5-->Xarelto); b. 03/2019 s/p DCCV; c. 04/2019 Repeat Amio load and DCCV.  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 Chronic heart failure ([place]) 05/24/2018  . Atrial fibrillation ([place]) 05/24/2018  . CKD (chronic kidney disease), stage III 05/17/2018  . 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 Shortness of breath 05/07/2018  . Pulmonary  hypertension, unspecified ([place]) 04/05/2018  . Chronic combined systolic (congestive) and diastolic (congestive) heart failure ([place]) 04/05/2018  . Paroxysmal sinus tachycardia ([place]) 01/29/2018  . Leg pain 01/04/2018  . PAD (peripheral artery disease) ([place]) 01/04/2018  . Carotid artery calcification, bilateral 12/19/2017  . Cervical radiculitis 12/19/2017  . Hyperglycemia 03/06/2016  . Hypothyroid 04/21/2015  . 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 Anxiety 04/21/2015  . Hyperlipidemia 04/21/2015  . Diuretic-induced hypokalemia 04/21/2015  . Palpitations 12/18/2012  . Hypertension 05/27/2012    Past Surgical History:  Procedure Laterality Date  . CARDIOVERSION N/A 03/26/2018   Procedure: CARDIOVERSION;  Surgeon: Nelva Bush, MD;  Location: [facility] ORS;  Service: Cardiovascular;  Laterality: N/A;  . CARDIOVERSION N/A 04/24/2018   Procedure: CARDIOVERSION;  Surgeon: Minna Merritts, MD;  Location: [facility] ORS;  Service: Cardiovascular;  Laterality: N/A;  . ELECTROPHYSIOLOGY STUDY N/A 02/27/2012   Procedure: ELECTROPHYSIOLOGY STUDY;  Surgeon: Evans Lance, MD;  Location: [facility] CATH LAB;  Service: Cardiovascular;  Laterality: N/A;  . EPS and ablation for SVT  5/13   slow pathway ablation by Dr Lovena Le  . EYE SURGERY     surgery for detatched retina  . SUPRAVENTRICULAR TACHYCARDIA ABLATION N/A 02/27/2012   Procedure: SUPRAVENTRICULAR TACHYCARDIA ABLATION;  Surgeon: Evans Lance, MD;  Location: [facility] CATH LAB;  Service: Cardiovascular;  Laterality: N/A;  Prior to Admission medications   Medication Sig Start Date End Date Taking? Authorizing Provider  acetaminophen (TYLENOL) 650 MG CR tablet Take 1,300 mg by mouth every 8 (eight) hours as needed for pain.    [provider]  amiodarone (PACERONE) 200 MG tablet TAKE 1 TABLET(200 MG) BY MOUTH DAILY 01/12/20   Minna Merritts, MD  amLODipine (NORVASC) 10 MG tablet Take 1 tablet (10 mg total) by mouth daily. 01/27/20  04/26/20  Theora Gianotti, NP  apixaban (ELIQUIS) 5 MG TABS tablet Take 1 tablet (5 mg total) by mouth 2 (two) times daily. 12/03/19   Steele Sizer, MD  busPIRone (BUSPAR) 5 MG tablet TAKE 1 TABLET(5 MG) BY MOUTH TWICE DAILY 02/05/20   Steele Sizer, MD  cholecalciferol (VITAMIN D) 1000 UNITS tablet Take 2,000 Units by mouth daily.    [provider]  ciprofloxacin (CIPRO) 500 MG tablet Take 1 tablet (500 mg total) by mouth 2 (two) times daily for 10 days. 03/11/20 03/21/20  Lavonia Drafts, MD  cyclobenzaprine (FLEXERIL) 10 MG tablet Take 10 mg by mouth 3 (three) times daily as needed. 07/30/19   [provider]  doxazosin (CARDURA) 1 MG tablet TAKE 1 TABLET(1 MG) BY MOUTH TWICE DAILY 01/12/20   Minna Merritts, MD  furosemide (LASIX) 20 MG tablet Take 1 tablet (20 mg total) by mouth as directed. Take once daily with extra pill after lunch as needed for swelling. 11/05/19 02/03/20  Minna Merritts, MD  HYDROcodone-acetaminophen (NORCO/VICODIN) 5-325 MG tablet Take 1 tablet by mouth every 6 (six) hours as needed. 07/30/19   [provider]  levothyroxine (SYNTHROID) 137 MCG tablet TAKE 1 TABLET(137 MCG) BY MOUTH EVERY MORNING 12/04/19   Ancil Boozer, Drue Stager, MD  metoprolol succinate (TOPROL-XL) 50 MG 24 hr tablet TAKE 1 TABLET BY MOUTH TWICE DAILY WITH OR IMMEDIATELY FOLLOWING MEALS 01/12/20   Minna Merritts, MD  metoprolol succinate (TOPROL-XL) 50 MG 24 hr tablet Take with or immediately following a meal. 01/12/20   Gollan, Kathlene November, MD  Multiple Vitamin (MULITIVITAMIN WITH MINERALS) TABS Take 1 tablet by mouth daily.    [provider]  potassium chloride (KLOR-CON) 10 MEQ tablet Take 1 tablet (10 mEq total) by mouth as directed. Take once daily with extra pill after lunch as needed with fluid pill for swelling. 11/05/19 02/03/20  Minna Merritts, MD  rosuvastatin (CRESTOR) 40 MG tablet TAKE 1 TABLET(40 MG) BY MOUTH DAILY 06/28/19   Ancil Boozer, Drue Stager, MD  temazepam  (RESTORIL) 15 MG capsule TAKE 1 CAPSULE(15 MG) BY MOUTH AT BEDTIME AS NEEDED FOR SLEEP 01/13/20   Ancil Boozer, Drue Stager, MD  tiZANidine (ZANAFLEX) 4 MG tablet  08/19/19   [provider]  valsartan (DIOVAN) 320 MG tablet TAKE 1 TABLET(320 MG) BY MOUTH DAILY 02/20/20   Minna Merritts, MD  Vitamins/Minerals TABS Take by mouth.    [provider]     Allergies Codeine, Erythromycin, Septra [sulfamethoxazole-trimethoprim], and Penicillins  Family History  Problem Relation Age of Onset  . Alzheimer's disease Mother   . Stroke Father   . Breast cancer Father   . Heart disease Father        Pacemaker  . Breast cancer Paternal Aunt   . Kidney cancer Maternal Grandmother   . Alzheimer's disease Maternal Grandfather   . Thyroid disease Paternal Grandmother   . Stroke Paternal Grandfather     Social History Social History   Tobacco Use  . Smoking status: Never Smoker  . Smokeless tobacco:  Never Used  . Tobacco comment: smoking cessation materials not required  Substance Use Topics  . Alcohol use: No  . Drug use: No    Review of Systems  Constitutional: No fever/chills Eyes: No visual changes.  ENT: No sore throat. Cardiovascular: Denies chest pain. Respiratory: Denies shortness of breath. Gastrointestinal: As above Genitourinary: As above Musculoskeletal: As above Skin: Negative for rash. Neurological: Negative for headaches or weakness   ____________________________________________   PHYSICAL EXAM:  VITAL SIGNS: ED Triage Vitals  Enc Vitals Group     BP 03/11/20 0844 (!) 153/49     Pulse Rate 03/11/20 0844 65     Resp 03/11/20 0844 16     Temp 03/11/20 0844 98 F (36.7 C)     Temp Source 03/11/20 0844 Oral     SpO2 03/11/20 0844 100 %     Weight 03/11/20 0845 58.1 kg (128 lb)     Height 03/11/20 0845 1.676 m (5\' 6" )     Head Circumference --      Peak Flow --      Pain Score 03/11/20 0845 7     Pain Loc --      Pain Edu? --      Excl. in [place]?  --     Constitutional: Alert and oriented. No acute distress.  Nose: No congestion/rhinnorhea. Mouth/Throat: Mucous membranes are moist.    Cardiovascular: Normal rate, regular rhythm. Grossly normal heart sounds.  Good peripheral circulation. Respiratory: Normal respiratory effort.  No retractions. Lungs CTAB. Gastrointestinal: Soft and nontender. No distention.  No CVA tenderness, reassuring exam  Musculoskeletal: No lower extremity tenderness nor edema.  Warm and well perfused Neurologic:  Normal speech and language. No gross focal neurologic deficits are appreciated.  Skin:  Skin is warm, dry and intact. No rash noted. Psychiatric: Mood and affect are normal. Speech and behavior are normal.  ____________________________________________   LABS (all labs ordered are listed, but only abnormal results are displayed)  Labs Reviewed  URINALYSIS, COMPLETE (UACMP) WITH MICROSCOPIC - Abnormal; Notable for the following components:      Result Value   Color, Urine ORANGE (*)    APPearance TURBID (*)    Hgb urine dipstick LARGE (*)    Protein, ur 100 (*)    Leukocytes,Ua LARGE (*)    RBC / HPF >50 (*)    WBC, UA >50 (*)    All other components within normal limits  CBC - Abnormal; Notable for the following components:   WBC 13.1 (*)    RBC 3.66 (*)    HCT 35.9 (*)    All other components within normal limits  BASIC METABOLIC PANEL - Abnormal; Notable for the following components:   Glucose, Bld 162 (*)    Creatinine, Ser 1.14 (*)    GFR calc non Af Amer 46 (*)    GFR calc Af Amer 54 (*)    All other components within normal limits  URINE CULTURE   ____________________________________________  EKG  None ____________________________________________  RADIOLOGY  CT renal stone study demonstrates fullness of each renal collecting system bilaterally ____________________________________________   PROCEDURES  Procedure(s) performed: No  Procedures   Critical Care  performed: No ____________________________________________   INITIAL IMPRESSION / ASSESSMENT AND PLAN / ED COURSE  Pertinent labs & imaging results that were available during my care of the patient were reviewed by me and considered in my medical decision making (see chart for details).  Patient presents with left flank pain which was moderate  to severe last night.  Differential includes kidney stone, UTI, pyelonephritis, colitis.  Lab work significant for elevated white blood cell count of 13.  Kidney function is unchanged from prior.  Will obtain CT imaging to evaluate for kidney stone.  Patient does not want any pain medication at this time  CT scan demonstrates fullness of each renal collecting system but no calculus.  Possibility of recently passed kidney stone but more likely possibly infectious related.  Urinalysis does demonstrate significant white blood cells.  I discussed with patient my concern about possible pyelonephritis and recommended admission however she is adamant that she will not stay in the hospital.  I was able to convince her to have a dose of IV antibiotics here.  She does have a significant penicillin allergy so Cipro was given.  She agrees to return immediately if any worsening including fever chills worsening pain nausea vomiting    ____________________________________________   FINAL CLINICAL IMPRESSION(S) / ED DIAGNOSES  Final diagnoses:  Pyelonephritis        Note:  This document was prepared using Dragon voice recognition software and may include unintentional dictation errors.   Lavonia Drafts, MD 03/11/20 1245

## 2020-03-11 NOTE — ED Notes (Signed)
Pt alert and oriented X 4, stable for discharge. RR even and unlabored, color WNL. Discussed discharge instructions and follow up when appropriate. Instructed to follow up with ER for any life threatening symptoms or concerns that patient or family of patient may have  

## 2020-03-11 NOTE — ED Triage Notes (Signed)
Patient reports lower back and left flank pain that started yesterday. Last night developed sharp pain and urinary urgency. States she is only able to void small amounts and has noticed blood in her urine this morning. Hx of kidney stones

## 2020-03-11 NOTE — ED Notes (Signed)
Pt to CT

## 2020-03-13 LAB — URINE CULTURE: Culture: 30000 — AB

## 2020-03-16 IMAGING — MR MR LUMBAR SPINE W/O CM
7 of 10 series · 35 of 48 positions shown · non-contrast
Comparison: Cervical spine radiographs 12/04/2017.

CLINICAL DATA: SIX-MONTH HISTORY OF CHRONIC CERVICAL PAIN WITH
CRACKING AND POPPING. THREE MONTH HISTORY OF LOW BACK PAIN EXTENDING
INTO THE RIGHT LOWER EXTREMITY. RIGHT LOWER EXTREMITY TINGLING.

EXAM:
MRI CERVICAL AND LUMBAR SPINE WITHOUT CONTRAST
TECHNIQUE: Multiplanar and multiecho pulse sequences of the cervical spine, to
include the craniocervical junction and cervicothoracic junction,
and lumbar spine, were obtained without intravenous contrast.

[Series 3: T2 · sagittal · 3.0mm · 0.70mm/px · 4 of 13 slices shown (1 of 4)]
[im 1/13]
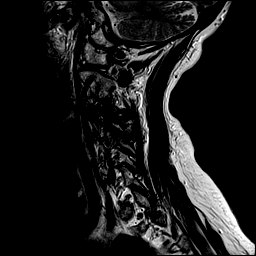
[im 5/13]
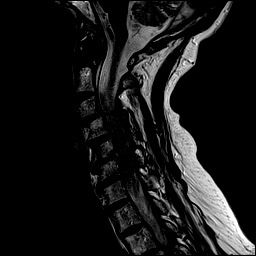
[im 9/13]
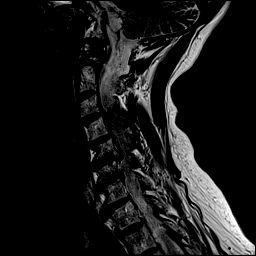
[im 13/13]
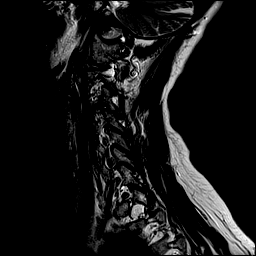

[Series 4: T1 · sagittal · 3.0mm · 0.70mm/px · 4 of 13 slices shown (1 of 3)]
[im 1/13]
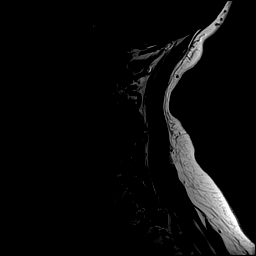
[im 5/13]
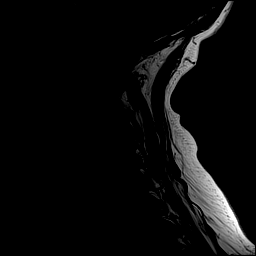
[im 9/13]
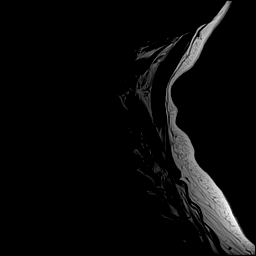
[im 13/13]
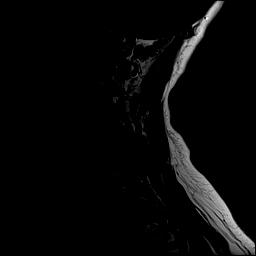

[Series 6: T2 · axial · 3.0mm · 0.70mm/px · z∈[-27,+63]mm · 6 of 25 slices shown (2 of 4)]
[im 1/25]
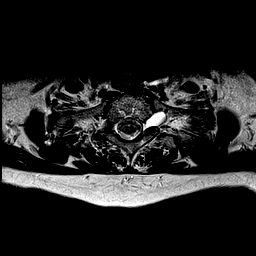
[im 5/25]
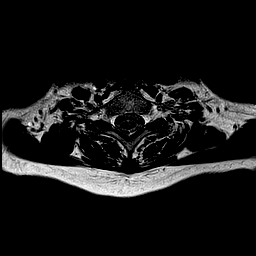
[im 10/25]
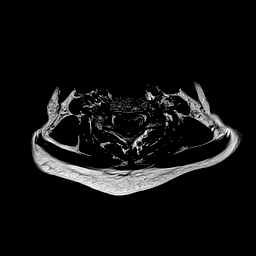
[im 15/25]
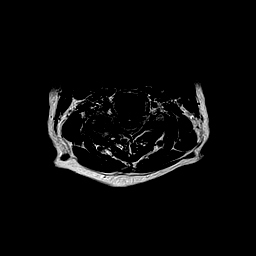
[im 20/25]
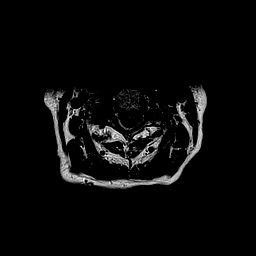
[im 25/25]
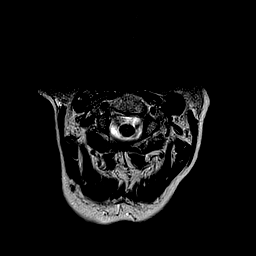

[Series 9: T2 · sagittal · 4.0mm · 0.81mm/px · 3 of 15 slices shown (3 of 4)]
[im 1/15]
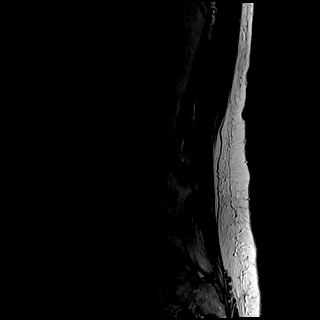
[im 8/15]
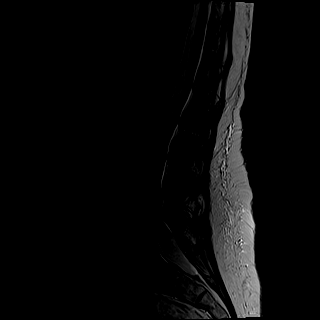
[im 15/15]
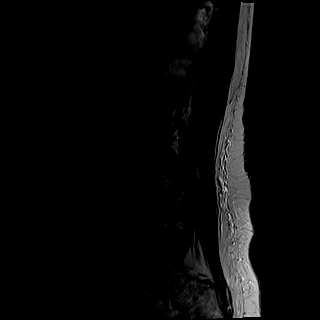

[Series 10: T1 · sagittal · 4.0mm · 0.81mm/px · 3 of 15 slices shown (2 of 3)]
[im 1/15]
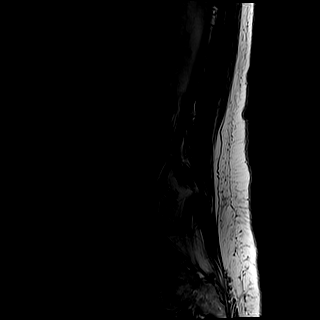
[im 8/15]
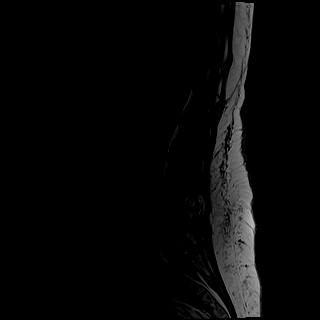
[im 15/15]
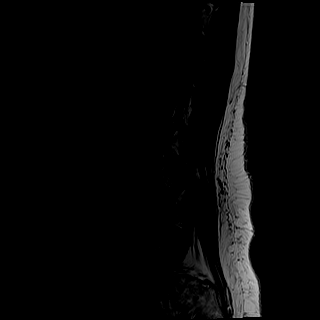

[Series 12: T2 · axial · 4.0mm · 0.78mm/px · z∈[-460,-233]mm · 8 of 36 slices shown (4 of 4)]
[im 1/36]
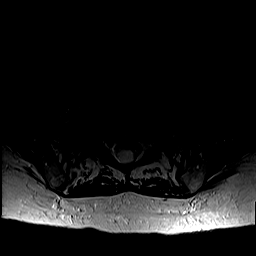
[im 6/36]
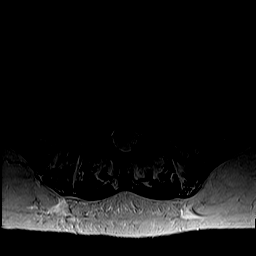
[im 11/36]
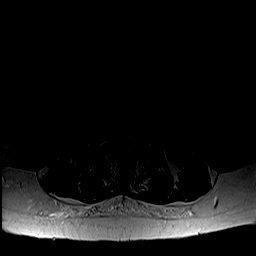
[im 16/36]
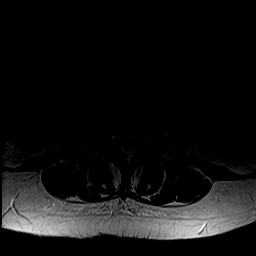
[im 21/36]
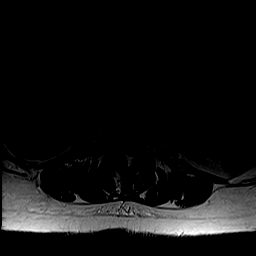
[im 26/36]
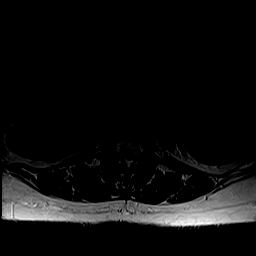
[im 31/36]
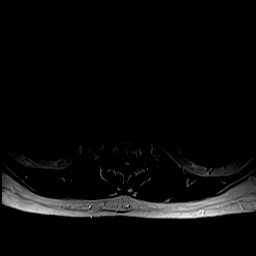
[im 36/36]
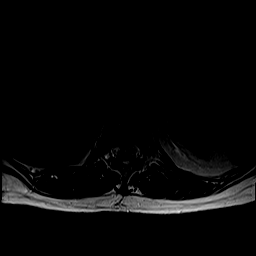

[Series 13: T1 · axial · 4.0mm · 0.39mm/px · z∈[-460,-257]mm · 7 of 36 slices shown (3 of 3)]
[im 1/36]
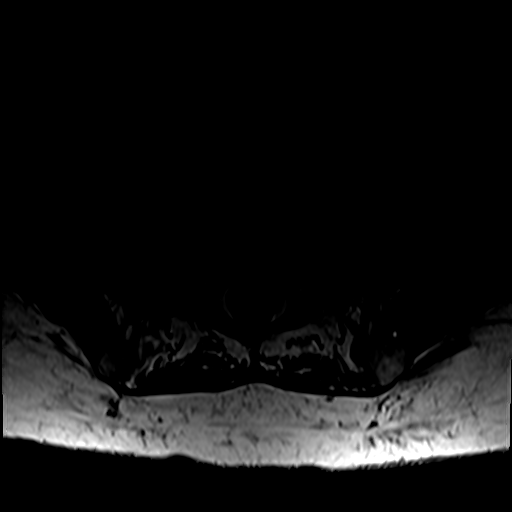
[im 6/36]
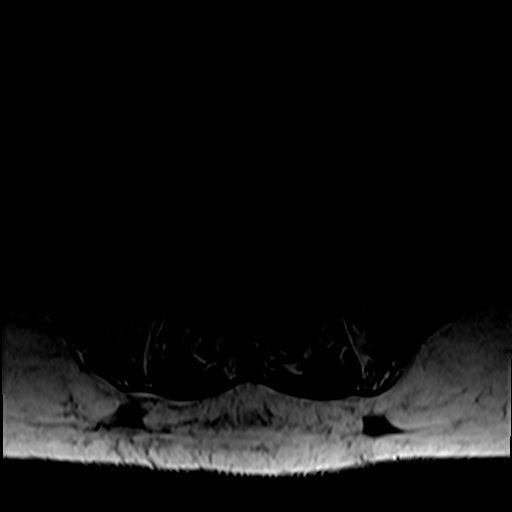
[im 11/36]
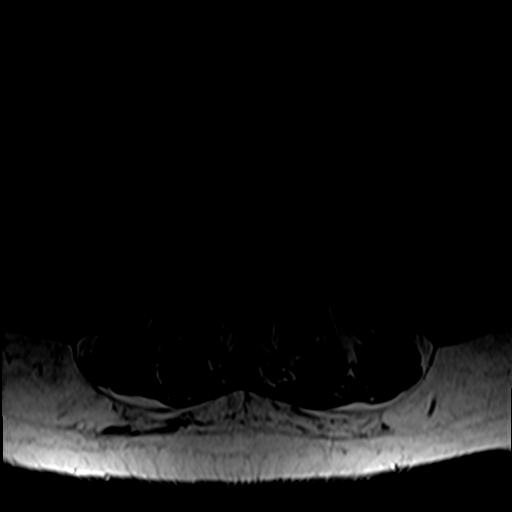
[im 16/36]
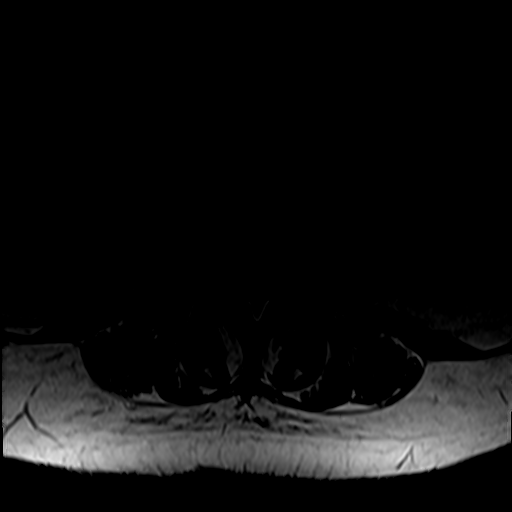
[im 21/36]
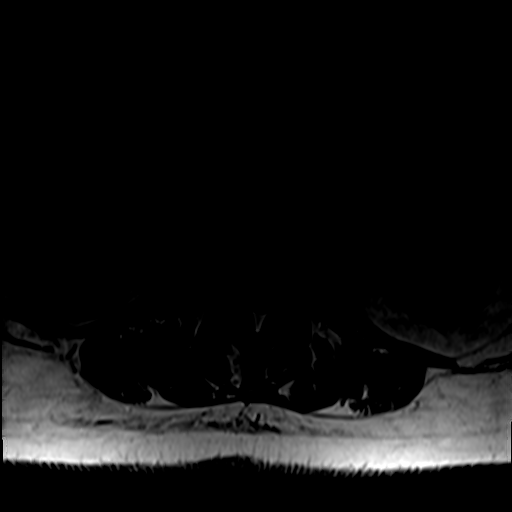
[im 26/36]
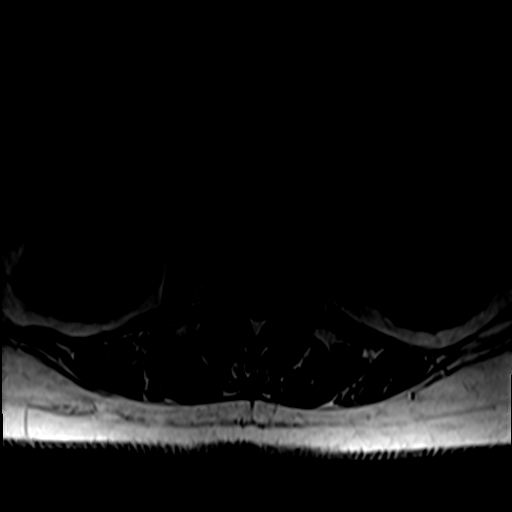
[im 31/36]
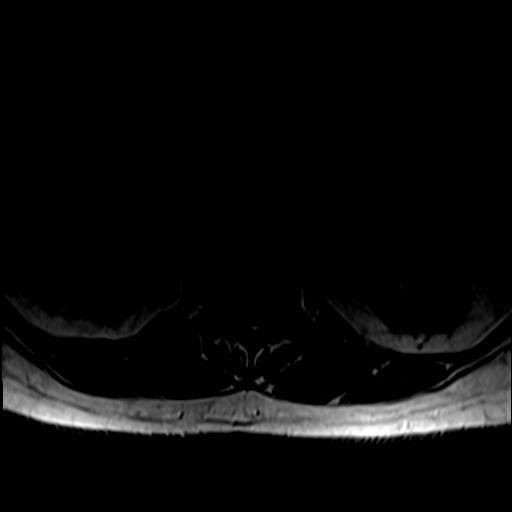

[35 of 48 positions shown; findings below may reference images not displayed]

FINDINGS: MRI CERVICAL SPINE FINDINGS

Alignment: Degenerative anterolisthesis is again noted at C3-4. More
mild anterolisthesis at C4-5 is stable. Retrolisthesis at C5-6 is
noted.

Vertebrae: Chronic endplate marrow changes are present at C5-6
greater than C6-7. Marrow signal and vertebral body heights are
otherwise normal.

Cord: Normal signal is present in the cervical and upper thoracic
spinal cord to the lowest imaged level, T2-3. There is some artifact
over the cord on the sagittal images.

Posterior Fossa, vertebral arteries, paraspinal tissues:
Craniocervical junction is normal. The visualized intracranial
contents are normal. Flow is present in the vertebral arteries
bilaterally. The right vertebral artery is dominant.

Paraspinous soft tissues are within normal limits. Musculature is
unremarkable.

Disc levels:

C2-3: Asymmetric facet hypertrophy is present on the right. This
contributes to mild right foraminal narrowing. Central canal and
left foramen are patent.

C3-4: There is uncovering of a broad-based disc osteophyte complex.
Advanced facet hypertrophy and spurring is present on the right.
This results in severe right and moderate left foraminal stenosis.
There is partial effacement of the ventral CSF.

C4-5: The central disc protrusion effaces the ventral CSF.
Asymmetric facet hypertrophy is present on the right. Mild foraminal
narrowing is worse on the right.

C5-6: A broad-based disc osteophyte complex is present.
Uncovertebral spurring is asymmetric on the left. Moderate facet
hypertrophy is noted bilaterally. Severe left and moderate right
foraminal narrowing is present. There is partial effacement of the
ventral CSF.

C6-7: A broad-based disc osteophyte complex partially effaces the
ventral CSF. Uncovertebral spurring contributes to moderate
foraminal narrowing bilaterally, slightly worse on the left.

C7-T1: Negative.

A perineural root sleeve cyst is noted on the left at T1-2.

MRI LUMBAR SPINE FINDINGS

Segmentation: 5 non rib-bearing lumbar type vertebral bodies are
present. The lowest fully formed vertebral body is L5.

Alignment: AP alignment is anatomic. There is slight retrolisthesis
at L1-2. Mild leftward curvature is centered at L3.

Vertebrae:  Marrow signal and vertebral body heights are normal.

Conus medullaris and cauda equina: Conus extends to the L1 level.
Conus and cauda equina appear normal.

Paraspinal and other soft tissues: Limited imaging of the abdomen is
unremarkable. There is no significant adenopathy.

Disc levels:

L1-2: Mild disc bulging is present bilaterally at L1-2 without
significant focal stenosis.

L2-3: A large right paramedian disc protrusion is present. This
results in severe right lateral recess and subarticular stenosis.
Mild left subarticular narrowing is present. Mild foraminal
narrowing is present on the right.

L3-4: A mild broad-based disc protrusion is asymmetric to the left.
Mild left subarticular narrowing is present. The foramina are patent
bilaterally.

L4-5: A leftward disc protrusion is present. Asymmetric left-sided
facet hypertrophy and spurring is noted. This results an moderate
left subarticular and foraminal stenosis. Mild right subarticular
and foraminal narrowing is present.

L5-S1: Moderate facet hypertrophy is worse on the right. There is no
significant stenosis.
IMPRESSION: 1. Multilevel spondylosis of the cervical spine as described.
2. Mild right foraminal narrowing at C2-3 due to asymmetric facet
hypertrophy.
3. Advanced right-sided facet hypertrophy at C3-4 and C4-5.
4. Severe right and moderate left foraminal narrowing at C3-4.
5. Mild foraminal narrowing at C4-5 is worse on the right.
6. Severe left and moderate right foraminal narrowing at C5-6.
7. Moderate foraminal narrowing bilaterally at C6-7 is slightly
worse on the left.
8. Mild central canal narrowing at multiple levels is most
significant at C4-5.
9. Large right paramedian disc protrusion at L2-3 with severe right
subarticular stenosis, likely impacting the right L3 nerve root.
10. Mild left subarticular and right foraminal narrowing at L2-3.
11. Mild left subarticular narrowing at L3-4 without significant
foraminal stenosis.
12. Moderate left and mild right subarticular and foraminal stenosis
at L4-5 secondary to a leftward disc protrusion. This potentially
impacts the L4 and L5 nerve roots.
13. Asymmetric right-sided facet hypertrophy at L5-S1 without focal
stenosis.

## 2020-03-17 ENCOUNTER — Telehealth: Payer: Self-pay | Admitting: Family Medicine

## 2020-03-17 NOTE — Chronic Care Management (AMB) (Signed)
  Care Management   Note  03/17/2020 Name: MARGRETTE ZARZECKI MRN: [id number] DOB: [birth year]  [name] is a 79 y.o. year old female who is a primary care patient of Steele Sizer, MD and is actively engaged with the care management team. I reached out to [name] by phone today to assist with scheduling an initial visit with the Pharmacist  Follow up plan: Patient declines further follow up and engagement by the care management team. Appropriate care team members and provider have been notified via electronic communication.   Noreene Larsson, [place], [place], [place] [address] Direct Dial: [phone] [email or web address]: [email or web address]

## 2020-03-29 ENCOUNTER — Other Ambulatory Visit: Payer: Self-pay

## 2020-03-29 ENCOUNTER — Inpatient Hospital Stay
Admission: EM | Admit: 2020-03-29 | Discharge: 2020-03-31 | DRG: 690 | Disposition: A | Discharge status: Home / Self Care | Payer: Medicare Other | Attending: Internal Medicine | Admitting: Internal Medicine

## 2020-03-29 ENCOUNTER — Emergency Department: Payer: Medicare Other

## 2020-03-29 ENCOUNTER — Encounter: Payer: Self-pay | Admitting: Emergency Medicine

## 2020-03-29 DIAGNOSIS — I4891 Unspecified atrial fibrillation: Secondary | ICD-10-CM

## 2020-03-29 DIAGNOSIS — Z79899 Other long term (current) drug therapy: Secondary | ICD-10-CM | POA: Diagnosis not present

## 2020-03-29 DIAGNOSIS — N179 Acute kidney failure, unspecified: Secondary | ICD-10-CM | POA: Diagnosis present

## 2020-03-29 DIAGNOSIS — E039 Hypothyroidism, unspecified: Secondary | ICD-10-CM | POA: Diagnosis not present

## 2020-03-29 DIAGNOSIS — Z7901 Long term (current) use of anticoagulants: Secondary | ICD-10-CM

## 2020-03-29 DIAGNOSIS — Z882 Allergy status to sulfonamides status: Secondary | ICD-10-CM

## 2020-03-29 DIAGNOSIS — B965 Pseudomonas (aeruginosa) (mallei) (pseudomallei) as the cause of diseases classified elsewhere: Secondary | ICD-10-CM | POA: Diagnosis present

## 2020-03-29 DIAGNOSIS — Z66 Do not resuscitate: Secondary | ICD-10-CM | POA: Diagnosis present

## 2020-03-29 DIAGNOSIS — Z20822 Contact with and (suspected) exposure to covid-19: Secondary | ICD-10-CM | POA: Diagnosis present

## 2020-03-29 DIAGNOSIS — Z880 Allergy status to penicillin: Secondary | ICD-10-CM

## 2020-03-29 DIAGNOSIS — I447 Left bundle-branch block, unspecified: Secondary | ICD-10-CM | POA: Diagnosis not present

## 2020-03-29 DIAGNOSIS — Z823 Family history of stroke: Secondary | ICD-10-CM | POA: Diagnosis not present

## 2020-03-29 DIAGNOSIS — Z87440 Personal history of urinary (tract) infections: Secondary | ICD-10-CM

## 2020-03-29 DIAGNOSIS — Z8249 Family history of ischemic heart disease and other diseases of the circulatory system: Secondary | ICD-10-CM

## 2020-03-29 DIAGNOSIS — Z8051 Family history of malignant neoplasm of kidney: Secondary | ICD-10-CM

## 2020-03-29 DIAGNOSIS — N136 Pyonephrosis: Secondary | ICD-10-CM | POA: Diagnosis not present

## 2020-03-29 DIAGNOSIS — I130 Hypertensive heart and chronic kidney disease with heart failure and stage 1 through stage 4 chronic kidney disease, or unspecified chronic kidney disease: Secondary | ICD-10-CM | POA: Diagnosis not present

## 2020-03-29 DIAGNOSIS — I5042 Chronic combined systolic (congestive) and diastolic (congestive) heart failure: Secondary | ICD-10-CM | POA: Diagnosis not present

## 2020-03-29 DIAGNOSIS — Z803 Family history of malignant neoplasm of breast: Secondary | ICD-10-CM | POA: Diagnosis not present

## 2020-03-29 DIAGNOSIS — E785 Hyperlipidemia, unspecified: Secondary | ICD-10-CM | POA: Diagnosis not present

## 2020-03-29 DIAGNOSIS — Z820 Family history of epilepsy and other diseases of the nervous system: Secondary | ICD-10-CM

## 2020-03-29 DIAGNOSIS — I480 Paroxysmal atrial fibrillation: Secondary | ICD-10-CM | POA: Diagnosis present

## 2020-03-29 DIAGNOSIS — N10 Acute tubulo-interstitial nephritis: Secondary | ICD-10-CM | POA: Diagnosis present

## 2020-03-29 DIAGNOSIS — N1330 Unspecified hydronephrosis: Secondary | ICD-10-CM | POA: Diagnosis present

## 2020-03-29 DIAGNOSIS — N12 Tubulo-interstitial nephritis, not specified as acute or chronic: Secondary | ICD-10-CM | POA: Diagnosis not present

## 2020-03-29 DIAGNOSIS — N183 Chronic kidney disease, stage 3 unspecified: Secondary | ICD-10-CM | POA: Diagnosis present

## 2020-03-29 DIAGNOSIS — Z03818 Encounter for observation for suspected exposure to other biological agents ruled out: Secondary | ICD-10-CM | POA: Diagnosis not present

## 2020-03-29 DIAGNOSIS — I4819 Other persistent atrial fibrillation: Secondary | ICD-10-CM | POA: Diagnosis present

## 2020-03-29 DIAGNOSIS — Z885 Allergy status to narcotic agent status: Secondary | ICD-10-CM

## 2020-03-29 DIAGNOSIS — B957 Other staphylococcus as the cause of diseases classified elsewhere: Secondary | ICD-10-CM | POA: Diagnosis not present

## 2020-03-29 DIAGNOSIS — F32A Depression, unspecified: Secondary | ICD-10-CM | POA: Diagnosis present

## 2020-03-29 DIAGNOSIS — I10 Essential (primary) hypertension: Secondary | ICD-10-CM | POA: Diagnosis not present

## 2020-03-29 DIAGNOSIS — I5043 Acute on chronic combined systolic (congestive) and diastolic (congestive) heart failure: Secondary | ICD-10-CM | POA: Diagnosis present

## 2020-03-29 DIAGNOSIS — N1831 Chronic kidney disease, stage 3a: Secondary | ICD-10-CM | POA: Diagnosis present

## 2020-03-29 DIAGNOSIS — I4821 Permanent atrial fibrillation: Secondary | ICD-10-CM | POA: Diagnosis not present

## 2020-03-29 DIAGNOSIS — Z79890 Hormone replacement therapy (postmenopausal): Secondary | ICD-10-CM

## 2020-03-29 DIAGNOSIS — F329 Major depressive disorder, single episode, unspecified: Secondary | ICD-10-CM | POA: Diagnosis present

## 2020-03-29 LAB — CBC WITH DIFFERENTIAL/PLATELET
Abs Immature Granulocytes: 0.05 10*3/uL (ref 0.00–0.07)
Basophils Absolute: 0 10*3/uL (ref 0.0–0.1)
Basophils Relative: 0 %
Eosinophils Absolute: 0.1 10*3/uL (ref 0.0–0.5)
Eosinophils Relative: 1 %
HCT: 37.5 % (ref 36.0–46.0)
Hemoglobin: 12.7 g/dL (ref 12.0–15.0)
Immature Granulocytes: 0 %
Lymphocytes Relative: 9 %
Lymphs Abs: 1.1 10*3/uL (ref 0.7–4.0)
MCH: 32 pg (ref 26.0–34.0)
MCHC: 33.9 g/dL (ref 30.0–36.0)
MCV: 94.5 fL (ref 80.0–100.0)
Monocytes Absolute: 0.6 10*3/uL (ref 0.1–1.0)
Monocytes Relative: 5 %
Neutro Abs: 9.8 10*3/uL — ABNORMAL HIGH (ref 1.7–7.7)
Neutrophils Relative %: 85 %
Platelets: 216 10*3/uL (ref 150–400)
RBC: 3.97 MIL/uL (ref 3.87–5.11)
RDW: 13.1 % (ref 11.5–15.5)
WBC: 11.6 10*3/uL — ABNORMAL HIGH (ref 4.0–10.5)
nRBC: 0 % (ref 0.0–0.2)

## 2020-03-29 LAB — COMPREHENSIVE METABOLIC PANEL
ALT: 27 U/L (ref 0–44)
AST: 34 U/L (ref 15–41)
Albumin: 4.4 g/dL (ref 3.5–5.0)
Alkaline Phosphatase: 64 U/L (ref 38–126)
Anion gap: 10 (ref 5–15)
BUN: 19 mg/dL (ref 8–23)
CO2: 24 mmol/L (ref 22–32)
Calcium: 8.9 mg/dL (ref 8.9–10.3)
Chloride: 100 mmol/L (ref 98–111)
Creatinine, Ser: 1.08 mg/dL — ABNORMAL HIGH (ref 0.44–1.00)
GFR calc Af Amer: 57 mL/min — ABNORMAL LOW (ref >60)
GFR calc non Af Amer: 49 mL/min — ABNORMAL LOW (ref >60)
Glucose, Bld: 203 mg/dL — ABNORMAL HIGH (ref 70–99)
Potassium: 3.8 mmol/L (ref 3.5–5.1)
Sodium: 134 mmol/L — ABNORMAL LOW (ref 135–145)
Total Bilirubin: 0.7 mg/dL (ref 0.3–1.2)
Total Protein: 7 g/dL (ref 6.5–8.1)

## 2020-03-29 LAB — LACTIC ACID, PLASMA
Lactic Acid, Venous: 2.4 mmol/L (ref 0.5–1.9)
Lactic Acid, Venous: 1 mmol/L (ref 0.5–1.9)

## 2020-03-29 LAB — URINALYSIS, COMPLETE (UACMP) WITH MICROSCOPIC
Bilirubin Urine: NEGATIVE
Glucose, UA: NEGATIVE mg/dL
Ketones, ur: NEGATIVE mg/dL
Nitrite: NEGATIVE
Protein, ur: 100 mg/dL — AB
RBC / HPF: >50 RBC/hpf — ABNORMAL HIGH (ref 0–5)
Specific Gravity, Urine: 1.018 (ref 1.005–1.030)
WBC, UA: >50 WBC/hpf — ABNORMAL HIGH (ref 0–5)
pH: 5 (ref 5.0–8.0)

## 2020-03-29 LAB — PROTIME-INR
INR: 1.3 — ABNORMAL HIGH (ref 0.8–1.2)
Prothrombin Time: 15.4 seconds — ABNORMAL HIGH (ref 11.4–15.2)

## 2020-03-29 LAB — SARS CORONAVIRUS 2 BY RT PCR (HOSPITAL ORDER, PERFORMED IN ~~LOC~~ HOSPITAL LAB): SARS Coronavirus 2: NEGATIVE

## 2020-03-29 LAB — BRAIN NATRIURETIC PEPTIDE: B Natriuretic Peptide: 169.2 pg/mL — ABNORMAL HIGH (ref 0.0–100.0)

## 2020-03-29 MED ORDER — METOPROLOL SUCCINATE ER 50 MG PO TB24
50.0000 mg | ORAL_TABLET | Freq: Every day | ORAL | Status: DC
  Administered 2020-03-31: 08:00:00 50 mg via ORAL
  Filled 2020-03-29 (×2): qty 1

## 2020-03-29 MED ORDER — AMLODIPINE BESYLATE 10 MG PO TABS
10.0000 mg | ORAL_TABLET | Freq: Every day | ORAL | Status: DC
  Administered 2020-03-29 – 2020-03-31 (×3): 10 mg via ORAL
  Filled 2020-03-29: qty 2
  Filled 2020-03-29 (×2): qty 1

## 2020-03-29 MED ORDER — VITAMIN D 25 MCG (1000 UNIT) PO TABS
2000.0000 [IU] | ORAL_TABLET | Freq: Every day | ORAL | Status: DC
  Administered 2020-03-30 – 2020-03-31 (×2): 2000 [IU] via ORAL
  Filled 2020-03-29 (×3): qty 2

## 2020-03-29 MED ORDER — ACETAMINOPHEN 325 MG PO TABS: 650.0000 mg | ORAL_TABLET | Freq: Four times a day (QID) | ORAL | Status: DC | PRN

## 2020-03-29 MED ORDER — SODIUM CHLORIDE 0.9 % IV SOLN: INTRAVENOUS | Status: DC

## 2020-03-29 MED ORDER — SODIUM CHLORIDE 0.9 % IV BOLUS
1000.0000 mL | Freq: Once | INTRAVENOUS | Status: AC
  Administered 2020-03-29: 1000 mL via INTRAVENOUS

## 2020-03-29 MED ORDER — SODIUM CHLORIDE 0.9% FLUSH: 3.0000 mL | Freq: Once | INTRAVENOUS | Status: DC

## 2020-03-29 MED ORDER — LEVOTHYROXINE SODIUM 137 MCG PO TABS
137.0000 ug | ORAL_TABLET | Freq: Every day | ORAL | Status: DC
  Administered 2020-03-30 – 2020-03-31 (×2): 137 ug via ORAL
  Filled 2020-03-29 (×2): qty 1

## 2020-03-29 MED ORDER — SODIUM CHLORIDE 0.9 % IV SOLN
2.0000 g | Freq: Three times a day (TID) | INTRAVENOUS | Status: DC
  Administered 2020-03-29 – 2020-03-30 (×2): 2 g via INTRAVENOUS
  Filled 2020-03-29 (×4): qty 2

## 2020-03-29 MED ORDER — ADULT MULTIVITAMIN W/MINERALS CH
1.0000 | ORAL_TABLET | Freq: Every day | ORAL | Status: DC
  Administered 2020-03-30 – 2020-03-31 (×2): 1 via ORAL
  Filled 2020-03-29 (×3): qty 1

## 2020-03-29 MED ORDER — TEMAZEPAM 7.5 MG PO CAPS
15.0000 mg | ORAL_CAPSULE | Freq: Every evening | ORAL | Status: DC | PRN
  Administered 2020-03-29 – 2020-03-30 (×2): 15 mg via ORAL
  Filled 2020-03-29: qty 2
  Filled 2020-03-29: qty 1
  Filled 2020-03-29: qty 2

## 2020-03-29 MED ORDER — SODIUM CHLORIDE 0.9 % IV SOLN
2.0000 g | Freq: Once | INTRAVENOUS | Status: AC
  Administered 2020-03-29: 2 g via INTRAVENOUS
  Filled 2020-03-29: qty 20

## 2020-03-29 MED ORDER — IRBESARTAN 150 MG PO TABS
300.0000 mg | ORAL_TABLET | Freq: Every day | ORAL | Status: DC
  Administered 2020-03-30 – 2020-03-31 (×2): 300 mg via ORAL
  Filled 2020-03-29 (×2): qty 2

## 2020-03-29 MED ORDER — BUSPIRONE HCL 5 MG PO TABS
5.0000 mg | ORAL_TABLET | Freq: Two times a day (BID) | ORAL | Status: DC
  Administered 2020-03-30 – 2020-03-31 (×3): 5 mg via ORAL
  Filled 2020-03-29 (×5): qty 1

## 2020-03-29 MED ORDER — ONDANSETRON HCL 4 MG/2ML IJ SOLN: 4.0000 mg | Freq: Three times a day (TID) | INTRAMUSCULAR | Status: DC | PRN

## 2020-03-29 MED ORDER — AMIODARONE HCL 200 MG PO TABS
200.0000 mg | ORAL_TABLET | Freq: Every day | ORAL | Status: DC
  Administered 2020-03-30 – 2020-03-31 (×2): 200 mg via ORAL
  Filled 2020-03-29 (×2): qty 1

## 2020-03-29 MED ORDER — ROSUVASTATIN CALCIUM 20 MG PO TABS
40.0000 mg | ORAL_TABLET | Freq: Every evening | ORAL | Status: DC
  Administered 2020-03-29 – 2020-03-30 (×2): 40 mg via ORAL
  Filled 2020-03-29 (×2): qty 2

## 2020-03-29 MED ORDER — DOXAZOSIN MESYLATE 1 MG PO TABS
1.0000 mg | ORAL_TABLET | Freq: Two times a day (BID) | ORAL | Status: DC
  Administered 2020-03-29 – 2020-03-31 (×4): 1 mg via ORAL
  Filled 2020-03-29 (×6): qty 1

## 2020-03-29 MED ORDER — SODIUM CHLORIDE 0.9 % IV SOLN: Freq: Once | INTRAVENOUS | Status: DC

## 2020-03-29 MED ORDER — HYDRALAZINE HCL 20 MG/ML IJ SOLN: 5.0000 mg | INTRAMUSCULAR | Status: DC | PRN

## 2020-03-29 NOTE — H&P (Signed)
History and Physical    GENIYAH EISCHEID UYQ:[id number] DOB: Jul 06, 1941 DOA: 03/29/2020  Referring MD/NP/PA:   PCP: Steele Sizer, MD   Patient coming from:  The patient is coming from home.  At baseline, pt is independent for most of ADL.        Chief Complaint: Dysuria and left flank and back pain  HPI: MADYSIN CRISP is a 79 y.o. female with medical history significant of hypertension, hyperlipidemia, hypothyroidism, depression with anxiety, atrial fibrillation on Eliquis, left bundle blockage, CKD stage III, CHF with EF of 45-50%, who presents with dysuria  Patient was evaluated in ED on 5/20 due to dysuria, and diagnosed with pyelonephritis. Pt had CT scan which demonstrated fullness of each renal collecting system, but no calculus.  Patient was given IV antibiotics, then discharged on ciprofloxacin. Patient states that she completed course of ciprofloxacin a week ago, but she still has symptoms of UTI.  She has dysuria, burning on urination, urinary urgency and hematuria.  Patient does not have nausea vomiting, diarrhea, abdominal pain.  No fever or chills.  No chest pain, shortness breath, cough.  She has left flank and back pain  ED Course: pt was found to have WBC 11.6, lactic acid 2.4, 1.0, urinalysis (turbid appearance, large amount of leukocyte, few bacteria, WBC >50), renal function stable, temperature normal, blood pressure 155/46, heart rate 57, RR 15, oxygen saturation 100% on room air.  Renal ultrasound showed mild right-sided hydronephrosis which is unchanged.  Patient is placed on MedSurg bed for observation.  Urology, Dr. Glori Luis is consulted by EDP   Review of Systems:   General: no fevers, chills, no body weight gain, has fatigue HEENT: no blurry vision, hearing changes or sore throat Respiratory: no dyspnea, coughing, wheezing CV: no chest pain, no palpitations GI: no nausea, vomiting, abdominal pain, diarrhea, constipation GU: has dysuria, burning on urination,  increased urinary frequency, hematuria  Ext: has trace leg edema Neuro: no unilateral weakness, numbness, or tingling, no vision change or hearing loss Skin: no rash, no skin tear. MSK: No muscle spasm, no deformity, no limitation of range of movement in spin. Has left flank and back pain Heme: No easy bruising.  Travel history: No recent long distant travel.  Allergy:  Allergies  Allergen Reactions  . Penicillins Swelling and Rash    Has patient had a PCN reaction causing immediate rash, facial/tongue/throat swelling, SOB or lightheadedness with hypotension: Yes Has patient had a PCN reaction causing severe rash involving mucus membranes or skin necrosis: No Has patient had a PCN reaction that required hospitalization: No Has patient had a PCN reaction occurring within the last 10 years: No If all of the above answers are "NO", then may proceed with Cephalosporin use.   . Codeine Nausea And Vomiting  . Erythromycin Itching and Swelling  . Septra [Sulfamethoxazole-Trimethoprim] Swelling    Past Medical History:  Diagnosis Date  . Anxiety   . Bronchitis   . Chronic combined systolic (congestive) and diastolic (congestive) heart failure ([place])    a. 02/2018 Echo: EF 45-50%, diff HK, triv AI, mod MR, mildly dil LA/RA, nl RV fxn, mod TR. PASP 40-11mmHg.  Marland Kitchen Chronic kidney disease   . DJD (degenerative joint disease), cervical   . History of stress test    a. 01/2008 MV: EF 76%. Fair ex tol. No ischemia.  . Hyperlipidemia   . Hypertension   . Hypothyroid   . LBBB (left bundle branch block)   . Leg pain  a. 01/2018 ABI's wnl.  . Lumbar spondylosis   . Persistent atrial fibrillation ([place])    a. 01/2018 Event monitor: 45 runs of SVT, longest 14.5 sec. SVT felt to be Afib/flutter-->2% burden. Longest run of AF 4h 43m (CHA2DS2VASc = 5-->Xarelto); b. 03/2019 s/p DCCV; c. 04/2019 Repeat Amio load and DCCV.  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 CARDIOVERSION N/A 03/26/2018   Procedure: CARDIOVERSION;  Surgeon: Nelva Bush, MD;  Location: [facility] ORS;  Service: Cardiovascular;  Laterality: N/A;  . CARDIOVERSION N/A 04/24/2018   Procedure: CARDIOVERSION;  Surgeon: Minna Merritts, MD;  Location: [facility] ORS;  Service: Cardiovascular;  Laterality: N/A;  . ELECTROPHYSIOLOGY STUDY N/A 02/27/2012   Procedure: ELECTROPHYSIOLOGY STUDY;  Surgeon: Evans Lance, MD;  Location[facility] CATH LAB;  Service: Cardiovascular;  Laterality: N/A;  . EPS and ablation for SVT  5/13   slow pathway ablation by Dr Lovena Le  . EYE SURGERY     surgery for detatched retina  . SUPRAVENTRICULAR TACHYCARDIA ABLATION N/A 02/27/2012   Procedure: SUPRAVENTRICULAR TACHYCARDIA ABLATION;  Surgeon: Evans Lance, MD;  Location: [facility] CATH LAB;  Service: Cardiovascular;  Laterality: N/A;    Social History:  reports that she has never smoked. She has never used smokeless tobacco. She reports that she does not drink alcohol or use drugs.  Family History:  Family History  Problem Relation Age of Onset  . Alzheimer's disease Mother   . Stroke Father   . Breast cancer Father   . Heart disease Father        Pacemaker  . Breast cancer Paternal Aunt   . Kidney cancer Maternal Grandmother   . Alzheimer's disease Maternal Grandfather   . Thyroid disease Paternal Grandmother   . Stroke Paternal Grandfather      Prior to Admission medications   Medication Sig Start Date End Date Taking? Authorizing Provider  acetaminophen (TYLENOL) 650 MG CR tablet Take 1,300 mg by mouth every 8 (eight) hours as needed for pain.    [provider]  amiodarone (PACERONE) 200 MG tablet TAKE 1 TABLET(200 MG) BY MOUTH DAILY 01/12/20   Minna Merritts, MD  amLODipine (NORVASC) 10 MG tablet Take 1 tablet (10 mg total) by mouth daily. 01/27/20 04/26/20  Theora Gianotti, NP  apixaban (ELIQUIS) 5 MG TABS tablet Take 1 tablet (5  mg total) by mouth 2 (two) times daily. 12/03/19   Steele Sizer, MD  busPIRone (BUSPAR) 5 MG tablet TAKE 1 TABLET(5 MG) BY MOUTH TWICE DAILY 02/05/20   Steele Sizer, MD  cholecalciferol (VITAMIN D) 1000 UNITS tablet Take 2,000 Units by mouth daily.    [provider]  cyclobenzaprine (FLEXERIL) 10 MG tablet Take 10 mg by mouth 3 (three) times daily as needed. 07/30/19   [provider]  doxazosin (CARDURA) 1 MG tablet TAKE 1 TABLET(1 MG) BY MOUTH TWICE DAILY 01/12/20   Minna Merritts, MD  furosemide (LASIX) 20 MG tablet Take 1 tablet (20 mg total) by mouth as directed. Take once daily with extra pill after lunch as needed for swelling. 11/05/19 02/03/20  Minna Merritts, MD  HYDROcodone-acetaminophen (NORCO/VICODIN) 5-325 MG tablet Take 1 tablet by mouth every 6 (six) hours as needed. 07/30/19   [provider]  levothyroxine (SYNTHROID) 137 MCG tablet TAKE 1 TABLET(137 MCG) BY MOUTH EVERY MORNING 12/04/19   Steele Sizer, MD  metoprolol succinate (TOPROL-XL) 50 MG 24 hr tablet TAKE 1 TABLET BY MOUTH TWICE DAILY WITH OR IMMEDIATELY FOLLOWING MEALS 01/12/20   Minna Merritts, MD  metoprolol succinate (TOPROL-XL) 50 MG 24 hr tablet Take with or immediately following a meal. 01/12/20   Gollan, Kathlene November, MD  Multiple Vitamin (MULITIVITAMIN WITH MINERALS) TABS Take 1 tablet by mouth daily.    [provider]  potassium chloride (KLOR-CON) 10 MEQ tablet Take 1 tablet (10 mEq total) by mouth as directed. Take once daily with extra pill after lunch as needed with fluid pill for swelling. 11/05/19 02/03/20  Minna Merritts, MD  rosuvastatin (CRESTOR) 40 MG tablet TAKE 1 TABLET(40 MG) BY MOUTH DAILY 06/28/19   Ancil Boozer, Drue Stager, MD  temazepam (RESTORIL) 15 MG capsule TAKE 1 CAPSULE(15 MG) BY MOUTH AT BEDTIME AS NEEDED FOR SLEEP 01/13/20   Ancil Boozer, Drue Stager, MD  tiZANidine (ZANAFLEX) 4 MG tablet  08/19/19   [provider]  valsartan (DIOVAN) 320 MG tablet TAKE 1  TABLET(320 MG) BY MOUTH DAILY 02/20/20   Minna Merritts, MD  Vitamins/Minerals TABS Take by mouth.    [provider]    Physical Exam: Vitals:   03/29/20 0918 03/29/20 0920 03/29/20 1218 03/29/20 1606  BP:  (!) 159/41 (!) 155/46 (!) 146/58  Pulse:  (!) 56 (!) 57 62  Resp:  15 15 16   Temp:  97.9 F (36.6 C) 97.6 F (36.4 C) 97.8 F (36.6 C)  TempSrc:  Oral Oral Oral  SpO2:  100% 100% 100%  Weight: 58.1 kg     Height: 5\' 6"  (1.676 m)      General: Not in acute distress HEENT:       Eyes: PERRL, EOMI, no scleral icterus.       ENT: No discharge from the ears and nose, no pharynx injection, no tonsillar enlargement.        Neck: No JVD, no bruit, no mass felt. Heme: No neck lymph node enlargement. Cardiac: S1/S2, RRR, No murmurs, No gallops or rubs. Respiratory: No rales, wheezing, rhonchi or rubs. GI: Soft, nondistended, nontender, no rebound pain, no organomegaly, BS present. GU: has hematuria and left CVA tenderness Ext: No pitting leg edema bilaterally. 2+DP/PT pulse bilaterally. Musculoskeletal: No joint deformities, No joint redness or warmth, no limitation of ROM in spin. Skin: No rashes.  Neuro: Alert, oriented X3, cranial nerves II-XII grossly intact, moves all extremities normally.  Psych: Patient is not psychotic, no suicidal or hemocidal ideation.  Labs on Admission: I have personally reviewed following labs and imaging studies  CBC: Recent Labs  Lab 03/29/20 0926  WBC 11.6*  NEUTROABS 9.8*  HGB 12.7  HCT 37.5  MCV 94.5  PLT 250   Basic Metabolic Panel: Recent Labs  Lab 03/29/20 0926  NA 134*  K 3.8  CL 100  CO2 24  GLUCOSE 203*  BUN 19  CREATININE 1.08*  CALCIUM 8.9   GFR: Estimated Creatinine Clearance: 40 mL/min (A) (by C-G formula based on SCr of 1.08 mg/dL (H)). Liver Function Tests: Recent Labs  Lab 03/29/20 0926  AST 34  ALT 27  ALKPHOS 64  BILITOT 0.7  PROT 7.0  ALBUMIN 4.4   No results for input(s): LIPASE, AMYLASE  in the last 168 hours. No results for input(s): AMMONIA in the last 168 hours. Coagulation Profile: Recent Labs  Lab 03/29/20 1524  INR 1.3*   Cardiac Enzymes: No results for input(s): CKTOTAL, CKMB, CKMBINDEX, TROPONINI in the last 168 hours. BNP (last  3 results) No results for input(s): PROBNP in the last 8760 hours. HbA1C: No results for input(s): HGBA1C in the last 72 hours. CBG: No results for input(s): GLUCAP in the last 168 hours. Lipid Profile: No results for input(s): CHOL, HDL, LDLCALC, TRIG, CHOLHDL, LDLDIRECT in the last 72 hours. Thyroid Function Tests: No results for input(s): TSH, T4TOTAL, FREET4, T3FREE, THYROIDAB in the last 72 hours. Anemia Panel: No results for input(s): VITAMINB12, FOLATE, FERRITIN, TIBC, IRON, RETICCTPCT in the last 72 hours. Urine analysis:    Component Value Date/Time   COLORURINE YELLOW (A) 03/29/2020 0926   APPEARANCEUR TURBID (A) 03/29/2020 0926   LABSPEC 1.018 03/29/2020 0926   PHURINE 5.0 03/29/2020 0926   GLUCOSEU NEGATIVE 03/29/2020 0926   HGBUR LARGE (A) 03/29/2020 0926   BILIRUBINUR NEGATIVE 03/29/2020 0926   BILIRUBINUR neg 02/02/2020 1402   KETONESUR NEGATIVE 03/29/2020 0926   PROTEINUR 100 (A) 03/29/2020 0926   UROBILINOGEN 0.2 02/02/2020 1402   NITRITE NEGATIVE 03/29/2020 0926   LEUKOCYTESUR LARGE (A) 03/29/2020 0926   Sepsis Labs: @LABRCNTIP (procalcitonin:4,lacticidven:4) ) Recent Results (from the past 240 hour(s))  SARS Coronavirus 2 by RT PCR (hospital order, performed in [place] hospital lab) Nasopharyngeal Nasopharyngeal Swab     Status: None   Collection Time: 03/29/20  2:25 PM   Specimen: Nasopharyngeal Swab  Result Value Ref Range Status   SARS Coronavirus 2 NEGATIVE NEGATIVE Final    Comment: (NOTE) SARS-CoV-2 target nucleic acids are NOT DETECTED. The SARS-CoV-2 RNA is generally detectable in upper and lower respiratory specimens during the acute phase of infection. The lowest concentration of  SARS-CoV-2 viral copies this assay can detect is 250 copies / mL. A negative result does not preclude SARS-CoV-2 infection and should not be used as the sole basis for treatment or other patient management decisions.  A negative result may occur with improper specimen collection / handling, submission of specimen other than nasopharyngeal swab, presence of viral mutation(s) within the areas targeted by this assay, and inadequate number of viral copies (<250 copies / mL). A negative result must be combined with clinical observations, patient history, and epidemiological information. Fact Sheet for Patients:   StrictlyIdeas.no Fact Sheet for Healthcare Providers: BankingDealers.co.za This test is not yet approved or cleared  by the [place] FDA and has been authorized for detection and/or diagnosis of SARS-CoV-2 by FDA under an Emergency Use Authorization (EUA).  This EUA will remain in effect (meaning this test can be used) for the duration of the COVID-19 declaration under Section 564(b)(1) of the Act, 21 U.S.C. section 360bbb-3(b)(1), unless the authorization is terminated or revoked sooner. Performed at [facility], [address] [place], [place] [address]      Radiological Exams on Admission: US Renal  Result Date: 03/29/2020 CLINICAL DATA:  Hematuria.  Pyelonephritis. EXAM: RENAL / URINARY TRACT ULTRASOUND COMPLETE COMPARISON:  CT abdomen pelvis dated Mar 11, 2020. FINDINGS: Right Kidney: Renal measurements: 10.2 x 4.4 x 4.1 cm = volume: 94 mL . Echogenicity within normal limits. Unchanged mild hydronephrosis. No mass visualized. Left Kidney: Renal measurements: 10.6 x 4.0 x 4.1 cm = volume: 91 mL. Echogenicity within normal limits. No mass or hydronephrosis visualized. Bladder: Appears normal for degree of bladder distention. Other: None. IMPRESSION: 1. Unchanged mild right hydronephrosis. Electronically Signed   By:  Titus Dubin M.D.   On: 03/29/2020 13:49     EKG:  Not done in ED, will get one.   Assessment/Plan Principal Problem:   Acute pyelonephritis Active  Problems:   Hypertension   Hypothyroid   Hyperlipidemia   Chronic combined systolic (congestive) and diastolic (congestive) heart failure (HCC)   CKD (chronic kidney disease), stage III   Atrial fibrillation (HCC)   Hydronephrosis of right kidney   Depression    Acute pyelonephritis: Patient has an elevated lactic acid 2.4, which is normalized.  Has mild leukocytosis, but no fever.  Does not meet criteria for sepsis.  Currently hemodynamically stable.  Patient has history of positive culture for Pseudomonas in the past, will choose antibiotics to cover Pseudomonas  -  Place on med-surg bed for obs - Aztreonam (patient received 1 dose of Rocephin in ED) - Follow up results of urine and blood cx and amend antibiotic regimen if needed per sensitivity results - prn Zofran for nausea - IVF: 1L of NS bolus in ED, followed by 75 cc/h  Hydronephrosis of right kidney: Urology, Dr. Glori Luis is consulted -f/u urologist recommendation  HTN:  -Continue home medications: Amlodipine, metoprolol, irbesartan -hydralazine prn  Hypothyroid -Synthroid  Hyperlipidemia -Crestor  Chronic combined systolic (congestive) and diastolic (congestive) heart failure ([place]): 2D echo on 03/21/2018 showed a EF of 45-50%.  Patient has trace leg edema, no shortness of breath.  CHF seem to be compensated. -Hold Lasix  CKD (chronic kidney disease), stage IIIa: Stable.  Creatinine 1.08, BUN 19 -Follow-up of a BMP  Atrial fibrillation ([place]): -Hold Eliquis due to hematuria -Continue metoprolol and amiodarone  Depression -BuSpar       DVT ppx: SCD Code Status: DNR (I discussed with patient and explained the meaning of CODE STATUS. Patient wants sto be DNR) Family Communication: not done, no family member is at bed side.   Disposition Plan:  Anticipate  discharge back to previous environment Consults called:  Dr. Gae Dry of urology Admission status: Med-surg bed for obs   Status is: Observation  The patient remains OBS appropriate and will d/c before 2 midnights.  Dispo: The patient is from: Home              Anticipated d/c is to: Home              Anticipated d/c date is: 1 day              Patient currently is not medically stable to d/c.           Date of Service 03/29/2020    Ivor Costa [facility]   If 7PM-7AM, please contact night-coverage [email or web address] 03/29/2020, 6:13 PM

## 2020-03-29 NOTE — ED Triage Notes (Signed)
Seen through ED on 5/20 for UTI, given a dose of IV antibiotics and sent home.  States finished Cipro yesterday.  States symptoms returned.  AAOx3.  Skin warm and dry. NAD

## 2020-03-29 NOTE — Consult Note (Signed)
Pharmacy Antibiotic Note  [name] is a 79 y.o. female admitted on 03/29/2020 with UTI. Patient with noted allergy to Zosyn that hospitalist would like to avoid challenging currently.  Patient received 1 dose of Rocephin in the ED but hospitalist would like Pseudomonas coverage as last urine culture was positive for this. Pharmacy has been consulted for Aztreonam dosing.  Plan: Aztreonam 2g IV Q8 hours  Height: 5\' 6"  (167.6 cm) Weight: 58.1 kg (128 lb 1.4 oz) IBW/kg (Calculated) : 59.3  Temp (24hrs), Avg:97.8 F (36.6 C), Min:97.6 F (36.4 C), Max:97.9 F (36.6 C)  Recent Labs  Lab 03/29/20 0926 03/29/20 1339  WBC 11.6*  --   CREATININE 1.08*  --   LATICACIDVEN 2.4* 1.0    Estimated Creatinine Clearance: 40 mL/min (A) (by C-G formula based on SCr of 1.08 mg/dL (H)).    Allergies  Allergen Reactions  . Penicillins Swelling and Rash    Has patient had a PCN reaction causing immediate rash, facial/tongue/throat swelling, SOB or lightheadedness with hypotension: Yes Has patient had a PCN reaction causing severe rash involving mucus membranes or skin necrosis: No Has patient had a PCN reaction that required hospitalization: No Has patient had a PCN reaction occurring within the last 10 years: No If all of the above answers are "NO", then may proceed with Cephalosporin use.   . Codeine Nausea And Vomiting  . Erythromycin Itching and Swelling  . Septra [Sulfamethoxazole-Trimethoprim] Swelling    Antimicrobials this admission: Rocephin 6/7 x1 Azactam 6/7 >>   Microbiology results: 6/7 BCx: pending 6/7 UCx: pending   Thank you for allowing pharmacy to be a part of this patient's care.  Pearla Dubonnet 03/29/2020 3:19 PM

## 2020-03-29 NOTE — ED Notes (Signed)
Meal provided 

## 2020-03-29 NOTE — ED Provider Notes (Signed)
ER Provider Note       Time seen: 11:00 AM    I have reviewed the vital signs and the nursing notes.  HISTORY   Chief Complaint Hematuria   HPI [name] is a 79 y.o. female with a history of anxiety, bronchitis, CHF, chronic kidney disease, hyperlipidemia, hypertension, atrial fibrillation who presents today for urinary tract infection symptoms.  Patient describes 5 out of 10 left lower back aching.  She states she was here and given IV antibiotics and just finished her Cipro yesterday.  Past Medical History:  Diagnosis Date  . Anxiety   . Bronchitis   . Chronic combined systolic (congestive) and diastolic (congestive) heart failure ([place])    a. 02/2018 Echo: EF 45-50%, diff HK, triv AI, mod MR, mildly dil LA/RA, nl RV fxn, mod TR. PASP 40-20mmHg.  Marland Kitchen Chronic kidney disease   . DJD (degenerative joint disease), cervical   . History of stress test    a. 01/2008 MV: EF 76%. Fair ex tol. No ischemia.  . Hyperlipidemia   . Hypertension   . Hypothyroid   . LBBB (left bundle branch block)   . Leg pain    a. 01/2018 ABI's wnl.  . Lumbar spondylosis   . Persistent atrial fibrillation ([place])    a. 01/2018 Event monitor: 45 runs of SVT, longest 14.5 sec. SVT felt to be Afib/flutter-->2% burden. Longest run of AF 4h 44m (CHA2DS2VASc = 5-->Xarelto); b. 03/2019 s/p DCCV; c. 04/2019 Repeat Amio load and DCCV.  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 CARDIOVERSION N/A 03/26/2018   Procedure: CARDIOVERSION;  Surgeon: Nelva Bush, MD;  Location: [facility] ORS;  Service: Cardiovascular;  Laterality: N/A;  . CARDIOVERSION N/A 04/24/2018   Procedure: CARDIOVERSION;  Surgeon: Minna Merritts, MD;  Location: [facility] ORS;  Service: Cardiovascular;  Laterality: N/A;  . ELECTROPHYSIOLOGY STUDY N/A 02/27/2012   Procedure: ELECTROPHYSIOLOGY STUDY;  Surgeon: Evans Lance, MD;  Location: [facility] CATH LAB;   Service: Cardiovascular;  Laterality: N/A;  . EPS and ablation for SVT  5/13   slow pathway ablation by Dr Lovena Le  . EYE SURGERY     surgery for detatched retina  . SUPRAVENTRICULAR TACHYCARDIA ABLATION N/A 02/27/2012   Procedure: SUPRAVENTRICULAR TACHYCARDIA ABLATION;  Surgeon: Evans Lance, MD;  Location: [facility] CATH LAB;  Service: Cardiovascular;  Laterality: N/A;    Allergies Codeine, Erythromycin, Septra [sulfamethoxazole-trimethoprim], and Penicillins   Review of Systems Constitutional: Negative for fever. Cardiovascular: Negative for chest pain. Respiratory: Negative for shortness of breath. Gastrointestinal: Negative for abdominal pain, vomiting and diarrhea. Musculoskeletal: Positive for back pain Skin: Negative for rash. Neurological: Negative for headaches, focal weakness or numbness.  All systems negative/normal/unremarkable except as stated in the HPI  ____________________________________________   PHYSICAL EXAM:  VITAL SIGNS: Vitals:   03/29/20 0920  BP: (!) 159/41  Pulse: (!) 56  Resp: 15  Temp: 97.9 F (36.6 C)  SpO2: 100%    Constitutional: Alert and oriented. Well appearing and in no distress. Eyes: Conjunctivae are normal. Normal extraocular movements. Cardiovascular: Normal rate, regular rhythm. No murmurs, rubs, or gallops. Respiratory: Normal respiratory effort without tachypnea nor retractions. Breath sounds are clear and equal bilaterally. No wheezes/rales/rhonchi. Gastrointestinal: Soft and nontender. Normal bowel sounds Musculoskeletal: Nontender with normal range of motion in extremities. No lower extremity tenderness nor edema.  No obvious CVA tenderness Neurologic:  Normal  speech and language. No gross focal neurologic deficits are appreciated.  Skin:  Skin is warm, dry and intact. No rash noted. Psychiatric: Speech and behavior are normal.  ____________________________________________   LABS (pertinent positives/negatives)  Labs Reviewed   LACTIC ACID, PLASMA - Abnormal; Notable for the following components:      Result Value   Lactic Acid, Venous 2.4 (*)    All other components within normal limits  COMPREHENSIVE METABOLIC PANEL - Abnormal; Notable for the following components:   Sodium 134 (*)    Glucose, Bld 203 (*)    Creatinine, Ser 1.08 (*)    GFR calc non Af Amer 49 (*)    GFR calc Af Amer 57 (*)    All other components within normal limits  CBC WITH DIFFERENTIAL/PLATELET - Abnormal; Notable for the following components:   WBC 11.6 (*)    Neutro Abs 9.8 (*)    All other components within normal limits  URINALYSIS, COMPLETE (UACMP) WITH MICROSCOPIC - Abnormal; Notable for the following components:   Color, Urine YELLOW (*)    APPearance TURBID (*)    Hgb urine dipstick LARGE (*)    Protein, ur 100 (*)    Leukocytes,Ua LARGE (*)    RBC / HPF >50 (*)    WBC, UA >50 (*)    Bacteria, UA FEW (*)    Non Squamous Epithelial PRESENT (*)    All other components within normal limits  URINE CULTURE  LACTIC ACID, PLASMA    RADIOLOGY  Images were viewed by me Ultrasound renal Reveals unchanged mild right hydronephrosis  DIFFERENTIAL DIAGNOSIS  Renal colic, UTI, pyelonephritis, perinephric abscess  ASSESSMENT AND PLAN  Pyelonephritis   Plan: The patient had presented for recurrence of pyelonephritis after being seen 2 weeks ago for same. Patient's labs are concerning for [place] with significant hematuria and leukocytosis in her urine.  Lactic acid was slightly elevated.  We will reobtain urine cultures and give IV antibiotics. I will discuss with hospitalist for admission and likely urology consultation.  Lenise Arena MD    Note: This note was generated in part or whole with voice recognition software. Voice recognition is usually quite accurate but there are transcription errors that can and very often do occur. I apologize for any typographical errors that were not detected and corrected.      Earleen Newport, MD 03/29/20 [phone]

## 2020-03-29 NOTE — Progress Notes (Signed)
Urology Consult   I have been asked to see the patient by Dr. Jimmye Norman, for evaluation and management of recurrent UTI and mild right hydronephrosis.  Chief Complaint: Dysuria, urgency/frequency, pelvic pain  HPI:  [name] is a 79 y.o. year old female with no prior urologic history who originally presented to the ED on 03/11/2020 with UTI symptoms of dysuria, urgency/frequency, gross hematuria, and pelvic pain.  Urinalysis was concerning for infection, and she was treated with a 10-day course of culture appropriate Cipro with complete resolution of her symptoms.  Urine culture ultimately grew Pseudomonas and Staph epidermidis.  She denies any hematuria not at the time of urinary tract infection.  She has never smoked, denies any other carcinogenic exposures.  She presented back to the ED today with recurrence of her lower urinary tract symptoms of pelvic pain, urgency and frequency, and dysuria, and urinalysis was again concerning for infection.  She denies any prior abdominal, pelvic, or urologic surgeries.  She denies any history of kidney stones.  A CT stone protocol was performed on her initial ER visit on 5/20 which showed mild right-sided hydronephrosis, possible subtle left hydronephrosis, no obstructing stones or renal stones.  Bladder was decompressed on CT.  A repeat renal ultrasound today shows mild right-sided hydronephrosis.  She denies any right-sided flank pain.  She is occasionally had some left-sided back pain, but she also recently had back surgery for a left-sided pinched nerve.  She denies any fevers or chills.  On review of her prior imaging, she has had multiple MRI spines, and there does appear to be subtle dilation of the right kidney as far back as May 2019.  Prior to May 20 her last UTI was about 2 years ago.  She is afebrile hemodynamically stable, with mild leukocytosis of 11.6.  Renal function is at her baseline with creatinine of ~1.  PMH: Past Medical  History:  Diagnosis Date  . Anxiety   . Bronchitis   . Chronic combined systolic (congestive) and diastolic (congestive) heart failure ([place])    a. 02/2018 Echo: EF 45-50%, diff HK, triv AI, mod MR, mildly dil LA/RA, nl RV fxn, mod TR. PASP 40-68mmHg.  Marland Kitchen Chronic kidney disease   . DJD (degenerative joint disease), cervical   . History of stress test    a. 01/2008 MV: EF 76%. Fair ex tol. No ischemia.  . Hyperlipidemia   . Hypertension   . Hypothyroid   . LBBB (left bundle branch block)   . Leg pain    a. 01/2018 ABI's wnl.  . Lumbar spondylosis   . Persistent atrial fibrillation ([place])    a. 01/2018 Event monitor: 45 runs of SVT, longest 14.5 sec. SVT felt to be Afib/flutter-->2% burden. Longest run of AF 4h 33m (CHA2DS2VASc = 5-->Xarelto); b. 03/2019 s/p DCCV; c. 04/2019 Repeat Amio load and DCCV.  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 CARDIOVERSION N/A 03/26/2018   Procedure: CARDIOVERSION;  Surgeon: Nelva Bush, MD;  Location: [facility] ORS;  Service: Cardiovascular;  Laterality: N/A;  . CARDIOVERSION N/A 04/24/2018   Procedure: CARDIOVERSION;  Surgeon: Minna Merritts, MD;  Location: [facility] ORS;  Service: Cardiovascular;  Laterality: N/A;  . ELECTROPHYSIOLOGY STUDY N/A 02/27/2012   Procedure: ELECTROPHYSIOLOGY STUDY;  Surgeon: Evans Lance, MD;  Location[facility] CATH LAB;  Service: Cardiovascular;  Laterality: N/A;  .  EPS and ablation for SVT  5/13   slow pathway ablation by Dr Lovena Le  . EYE SURGERY     surgery for detatched retina  . SUPRAVENTRICULAR TACHYCARDIA ABLATION N/A 02/27/2012   Procedure: SUPRAVENTRICULAR TACHYCARDIA ABLATION;  Surgeon: Evans Lance, MD;  Location: [facility] CATH LAB;  Service: Cardiovascular;  Laterality: N/A;     Allergies:  Allergies  Allergen Reactions  . Penicillins Swelling and Rash    Has patient had a PCN reaction causing immediate  rash, facial/tongue/throat swelling, SOB or lightheadedness with hypotension: Yes Has patient had a PCN reaction causing severe rash involving mucus membranes or skin necrosis: No Has patient had a PCN reaction that required hospitalization: No Has patient had a PCN reaction occurring within the last 10 years: No If all of the above answers are "NO", then may proceed with Cephalosporin use.   . Codeine Nausea And Vomiting  . Erythromycin Itching and Swelling  . Septra [Sulfamethoxazole-Trimethoprim] Swelling    Family History: Family History  Problem Relation Age of Onset  . Alzheimer's disease Mother   . Stroke Father   . Breast cancer Father   . Heart disease Father        Pacemaker  . Breast cancer Paternal Aunt   . Kidney cancer Maternal Grandmother   . Alzheimer's disease Maternal Grandfather   . Thyroid disease Paternal Grandmother   . Stroke Paternal Grandfather     Social History:  reports that she has never smoked. She has never used smokeless tobacco. She reports that she does not drink alcohol or use drugs.  ROS: Negative aside from those stated in the HPI.  Physical Exam: BP (!) 146/58 (BP Location: Left Arm)   Pulse 62   Temp 97.8 F (36.6 C) (Oral)   Resp 16   Ht 5\' 6"  (1.676 m)   Wt 58.1 kg   SpO2 100%   BMI 20.67 kg/m    Constitutional:  Alert and oriented, No acute distress.  Well-appearing Cardiovascular: No clubbing, cyanosis, or edema. Respiratory: Normal respiratory effort, no increased work of breathing. GI: Abdomen is soft, nontender, nondistended, no abdominal masses GU: No CVA tenderness  Laboratory Data: Reviewed, see HPI  Pertinent Imaging: Reviewed, see HPI  Assessment & Plan:   In summary, she is a relatively healthy 79 year old female who originally presented with UTI symptoms on 03/12/2019 and was managed as an outpatient with 10 days of culture appropriate Cipro for Pseudomonas and staph epidermidis UTI.  She completed her  antibiotics a week ago that completely resolved her symptoms, but UTI symptoms of dysuria, urgency/frequency, and pelvic pain recurred yesterday and urinalysis looks grossly infected again today.  She is hemodynamically stable with no flank pain and no fevers.  There is suggestion of mild right-sided hydronephrosis on renal ultrasound that is stable from prior CT, as well as prior MRI imaging as far back as May 2019.  I do not feel she warrants ureteral stent placement acutely.  We discussed at length that she will require further evaluation of the mild right-sided hydronephrosis as an outpatient with further imaging pending renal function.   Recommendations: -Follow urine cultures -Recommend 2-week course of culture appropriate antibiotics -Urology will continue to follow -We will arrange clinic follow-up in 2 to 3 weeks to discuss further imaging with possible CT urogram and cystoscopy vs. NM renal scan vs. cystoscopy and bilateral retrograde pyelograms for further evaluation of the subtle right-sided hydronephrosis  Billey Co, MD  Total time spent on the floor  was 50 minutes, with greater than 50% spent in counseling and coordination of care with the patient regarding recurrent UTI, mild right-sided hydronephrosis, and prior imaging.  [facility] [address], [place], [place] [address] [phone]

## 2020-03-30 ENCOUNTER — Telehealth: Payer: Self-pay | Admitting: Urology

## 2020-03-30 DIAGNOSIS — Z8249 Family history of ischemic heart disease and other diseases of the circulatory system: Secondary | ICD-10-CM | POA: Diagnosis not present

## 2020-03-30 DIAGNOSIS — I130 Hypertensive heart and chronic kidney disease with heart failure and stage 1 through stage 4 chronic kidney disease, or unspecified chronic kidney disease: Secondary | ICD-10-CM | POA: Diagnosis present

## 2020-03-30 DIAGNOSIS — Z79890 Hormone replacement therapy (postmenopausal): Secondary | ICD-10-CM | POA: Diagnosis not present

## 2020-03-30 DIAGNOSIS — Z20822 Contact with and (suspected) exposure to covid-19: Secondary | ICD-10-CM | POA: Diagnosis present

## 2020-03-30 DIAGNOSIS — Z880 Allergy status to penicillin: Secondary | ICD-10-CM | POA: Diagnosis not present

## 2020-03-30 DIAGNOSIS — Z885 Allergy status to narcotic agent status: Secondary | ICD-10-CM | POA: Diagnosis not present

## 2020-03-30 DIAGNOSIS — Z882 Allergy status to sulfonamides status: Secondary | ICD-10-CM | POA: Diagnosis not present

## 2020-03-30 DIAGNOSIS — Z820 Family history of epilepsy and other diseases of the nervous system: Secondary | ICD-10-CM | POA: Diagnosis not present

## 2020-03-30 DIAGNOSIS — I4821 Permanent atrial fibrillation: Secondary | ICD-10-CM | POA: Diagnosis not present

## 2020-03-30 DIAGNOSIS — Z803 Family history of malignant neoplasm of breast: Secondary | ICD-10-CM | POA: Diagnosis not present

## 2020-03-30 DIAGNOSIS — N136 Pyonephrosis: Secondary | ICD-10-CM | POA: Diagnosis present

## 2020-03-30 DIAGNOSIS — I10 Essential (primary) hypertension: Secondary | ICD-10-CM | POA: Diagnosis not present

## 2020-03-30 DIAGNOSIS — N1831 Chronic kidney disease, stage 3a: Secondary | ICD-10-CM | POA: Diagnosis present

## 2020-03-30 DIAGNOSIS — N10 Acute tubulo-interstitial nephritis: Secondary | ICD-10-CM | POA: Diagnosis not present

## 2020-03-30 DIAGNOSIS — Z7901 Long term (current) use of anticoagulants: Secondary | ICD-10-CM | POA: Diagnosis not present

## 2020-03-30 DIAGNOSIS — N12 Tubulo-interstitial nephritis, not specified as acute or chronic: Secondary | ICD-10-CM | POA: Diagnosis present

## 2020-03-30 DIAGNOSIS — Z8051 Family history of malignant neoplasm of kidney: Secondary | ICD-10-CM | POA: Diagnosis not present

## 2020-03-30 DIAGNOSIS — B957 Other staphylococcus as the cause of diseases classified elsewhere: Secondary | ICD-10-CM | POA: Diagnosis present

## 2020-03-30 DIAGNOSIS — I5042 Chronic combined systolic (congestive) and diastolic (congestive) heart failure: Secondary | ICD-10-CM | POA: Diagnosis not present

## 2020-03-30 DIAGNOSIS — Z823 Family history of stroke: Secondary | ICD-10-CM | POA: Diagnosis not present

## 2020-03-30 DIAGNOSIS — I447 Left bundle-branch block, unspecified: Secondary | ICD-10-CM | POA: Diagnosis present

## 2020-03-30 DIAGNOSIS — E785 Hyperlipidemia, unspecified: Secondary | ICD-10-CM | POA: Diagnosis present

## 2020-03-30 DIAGNOSIS — B965 Pseudomonas (aeruginosa) (mallei) (pseudomallei) as the cause of diseases classified elsewhere: Secondary | ICD-10-CM | POA: Diagnosis present

## 2020-03-30 DIAGNOSIS — Z79899 Other long term (current) drug therapy: Secondary | ICD-10-CM | POA: Diagnosis not present

## 2020-03-30 DIAGNOSIS — Z66 Do not resuscitate: Secondary | ICD-10-CM | POA: Diagnosis present

## 2020-03-30 DIAGNOSIS — I4819 Other persistent atrial fibrillation: Secondary | ICD-10-CM | POA: Diagnosis present

## 2020-03-30 DIAGNOSIS — F329 Major depressive disorder, single episode, unspecified: Secondary | ICD-10-CM | POA: Diagnosis present

## 2020-03-30 DIAGNOSIS — E039 Hypothyroidism, unspecified: Secondary | ICD-10-CM | POA: Diagnosis present

## 2020-03-30 LAB — BASIC METABOLIC PANEL
Anion gap: 6 (ref 5–15)
BUN: 12 mg/dL (ref 8–23)
CO2: 26 mmol/L (ref 22–32)
Calcium: 8.8 mg/dL — ABNORMAL LOW (ref 8.9–10.3)
Chloride: 107 mmol/L (ref 98–111)
Creatinine, Ser: 0.79 mg/dL (ref 0.44–1.00)
GFR calc Af Amer: >60 mL/min (ref >60)
GFR calc non Af Amer: >60 mL/min (ref >60)
Glucose, Bld: 112 mg/dL — ABNORMAL HIGH (ref 70–99)
Potassium: 3.8 mmol/L (ref 3.5–5.1)
Sodium: 139 mmol/L (ref 135–145)

## 2020-03-30 LAB — CBC
HCT: 33.9 % — ABNORMAL LOW (ref 36.0–46.0)
Hemoglobin: 11.5 g/dL — ABNORMAL LOW (ref 12.0–15.0)
MCH: 32.2 pg (ref 26.0–34.0)
MCHC: 33.9 g/dL (ref 30.0–36.0)
MCV: 95 fL (ref 80.0–100.0)
Platelets: 200 10*3/uL (ref 150–400)
RBC: 3.57 MIL/uL — ABNORMAL LOW (ref 3.87–5.11)
RDW: 13.2 % (ref 11.5–15.5)
WBC: 7.1 10*3/uL (ref 4.0–10.5)
nRBC: 0 % (ref 0.0–0.2)

## 2020-03-30 LAB — HEMOGLOBIN A1C
Hgb A1c MFr Bld: 5 % (ref 4.8–5.6)
Mean Plasma Glucose: 96.8 mg/dL

## 2020-03-30 LAB — URINE CULTURE

## 2020-03-30 MED ORDER — SODIUM CHLORIDE 0.9 % IV SOLN
2.0000 g | Freq: Two times a day (BID) | INTRAVENOUS | Status: DC
  Administered 2020-03-30 – 2020-03-31 (×3): 2 g via INTRAVENOUS
  Filled 2020-03-30 (×4): qty 2

## 2020-03-30 MED ORDER — APIXABAN 5 MG PO TABS
5.0000 mg | ORAL_TABLET | Freq: Two times a day (BID) | ORAL | Status: DC
  Administered 2020-03-30 – 2020-03-31 (×3): 5 mg via ORAL
  Filled 2020-03-30 (×3): qty 1

## 2020-03-30 NOTE — Consult Note (Signed)
Pharmacy Antibiotic Note  [name] is a 79 y.o. female admitted on 03/29/2020 with UTI. Patient with noted allergy to Zosyn that hospitalist would like to avoid challenging currently.  Patient received 1 dose of Rocephin in the ED but hospitalist would like Pseudomonas coverage as last urine culture was positive for this.  Pt tolerant of cephalosporins so ok to change to Cefepime per Dr Roosevelt Locks.  Plan: Cefepime 2g q12h  Height: 5\' 6"  (167.6 cm) Weight: 57.8 kg (127 lb 6.8 oz) IBW/kg (Calculated) : 59.3  Temp (24hrs), Avg:97.8 F (36.6 C), Min:97.6 F (36.4 C), Max:97.9 F (36.6 C)  Recent Labs  Lab 03/29/20 0926 03/29/20 1339 03/30/20 0412  WBC 11.6*  --  7.1  CREATININE 1.08*  --  0.79  LATICACIDVEN 2.4* 1.0  --     Estimated Creatinine Clearance: 53.7 mL/min (by C-G formula based on SCr of 0.79 mg/dL).    Allergies  Allergen Reactions  . Penicillins Swelling and Rash    Has patient had a PCN reaction causing immediate rash, facial/tongue/throat swelling, SOB or lightheadedness with hypotension: Yes Has patient had a PCN reaction causing severe rash involving mucus membranes or skin necrosis: No Has patient had a PCN reaction that required hospitalization: No Has patient had a PCN reaction occurring within the last 10 years: No If all of the above answers are "NO", then may proceed with Cephalosporin use.   . Codeine Nausea And Vomiting  . Erythromycin Itching and Swelling  . Septra [Sulfamethoxazole-Trimethoprim] Swelling    Antimicrobials this admission: Rocephin 6/7 x1 Azactam 6/7 >> 6/8 Cefepime 6/8 >>  Microbiology results: 6/7 BCx: pending 6/7 UCx: pending   Thank you for allowing pharmacy to be a part of this patient's care.  Lu Duffel, PharmD, BCPS Clinical Pharmacist 03/30/2020 12:02 PM

## 2020-03-30 NOTE — Telephone Encounter (Signed)
Done

## 2020-03-30 NOTE — Progress Notes (Signed)
PROGRESS NOTE    [name]  SWF:[id number] DOB: Sep 14, 1941 DOA: 03/29/2020 PCP: Steele Sizer, MD   Complaint.  Dysuria hematuria.   Brief Narrative:  Patient is a 79 year old female with history of chronic combined systolic and diastolic congestive heart failure, chronic kidney disease, essential hypertension, hypothyroidism, atrial fibrillation on chronic anticoagulation who present to the hospital with complaints of dysuria and hematuria and flank pain. Patient had 3 episodes of UTI/pyelonephritis recently.  Most recent urine culture was positive for coag negative staph and Pseudomonas. Upon arrival in the emergency room, she did not meet sepsis criteria.  Renal ultrasound showed mild right-sided hydronephrosis.  She is evaluated by urology, recommend outpatient work-up after completion of antibiotics.  Patient will need 2 weeks antibiotics based on culture results.   Assessment & Plan:   Principal Problem:   Acute pyelonephritis Active Problems:   Hypertension   Hypothyroid   Hyperlipidemia   Chronic combined systolic (congestive) and diastolic (congestive) heart failure (HCC)   CKD (chronic kidney disease), stage III   Atrial fibrillation (HCC)   Hydronephrosis of right kidney   Depression  #1.  Acute pyelonephritis. Patient had a recurrent UTI/pyelonephritis for the last few months.  She also had a recurrent hematuria, probably due to anticoagulation. Most recent urine culture grow Pseudomonas and coag negative strep.  Currently covered with aztreonam.  Pending urine culture results. Plan is to treat for 2 weeks with proper antibiotics. Patient will follow up with urology as outpatient as patient also has a mild right-sided hydronephrosis.  #2.  Right-sided hydronephrosis. As above.  3.  Essential hypertension. Continue home medicines.  4.  Chronic atrial fibrillation. Resume anticoagulation as hematuria is improving with antibiotic treatment.  5.  Chronic  combined systolic and diastolic congestive heart failure. No exacerbation.  6.  Acute kidney injury. Renal function now is normal.  Patient seemed does not have chronic kidney disease stage III.  Discontinue IV fluids.     DVT prophylaxis: Eliquis Code Status: DNR Family Communication: None Disposition Plan:  . Patient came from: Home            . Anticipated d/c place: Home . Barriers to d/c OR conditions which need to be met to effect a safe d/c: Possible discharge tomorrow.  Consultants:   Urology  Procedures: None Antimicrobials: Aztreonam  Subjective: Patient still has some dysuria, no additional hematuria.  Flank pain is better. No fever or chills. No abdominal pain or nausea vomiting. No diarrhea or constipation.  Objective: Vitals:   03/29/20 2116 03/29/20 2359 03/30/20 0411 03/30/20 0734  BP:  (!) 120/41 (!) 132/46 (!) 146/44  Pulse:  (!) 57 (!) 53 (!) 55  Resp:  16 14 14   Temp:  97.8 F (36.6 C) 97.8 F (36.6 C) 97.9 F (36.6 C)  TempSrc:  Oral  Oral  SpO2:  98% 99% 98%  Weight: 57.8 kg     Height: 5' 6"  (1.676 m)       Intake/Output Summary (Last 24 hours) at 03/30/2020 0840 Last data filed at 03/30/2020 0300 Gross per 24 hour  Intake 1793.59 ml  Output --  Net 1793.59 ml   Filed Weights   03/29/20 0918 03/29/20 2116  Weight: 58.1 kg 57.8 kg    Examination:  General exam: Appears calm and comfortable  Respiratory system: Clear to auscultation. Respiratory effort normal. Cardiovascular system: Irregularly irregular rhythm and a normal rate. No JVD, murmurs, rubs, gallops or clicks. No pedal edema. Gastrointestinal system: Abdomen is nondistended,  soft and nontender. No organomegaly or masses felt. Normal bowel sounds heard. Central nervous system: Alert and oriented. No focal neurological deficits. Extremities: Symmetric  Skin: No rashes, lesions or ulcers Psychiatry: Judgement and insight appear normal. Mood & affect appropriate.      Data Reviewed: I have personally reviewed following labs and imaging studies  CBC: Recent Labs  Lab 03/29/20 0926 03/30/20 0412  WBC 11.6* 7.1  NEUTROABS 9.8*  --   HGB 12.7 11.5*  HCT 37.5 33.9*  MCV 94.5 95.0  PLT 216 240   Basic Metabolic Panel: Recent Labs  Lab 03/29/20 0926 03/30/20 0412  NA 134* 139  K 3.8 3.8  CL 100 107  CO2 24 26  GLUCOSE 203* 112*  BUN 19 12  CREATININE 1.08* 0.79  CALCIUM 8.9 8.8*   GFR: Estimated Creatinine Clearance: 53.7 mL/min (by C-G formula based on SCr of 0.79 mg/dL). Liver Function Tests: Recent Labs  Lab 03/29/20 0926  AST 34  ALT 27  ALKPHOS 64  BILITOT 0.7  PROT 7.0  ALBUMIN 4.4   No results for input(s): LIPASE, AMYLASE in the last 168 hours. No results for input(s): AMMONIA in the last 168 hours. Coagulation Profile: Recent Labs  Lab 03/29/20 1524  INR 1.3*   Cardiac Enzymes: No results for input(s): CKTOTAL, CKMB, CKMBINDEX, TROPONINI in the last 168 hours. BNP (last 3 results) No results for input(s): PROBNP in the last 8760 hours. HbA1C: No results for input(s): HGBA1C in the last 72 hours. CBG: No results for input(s): GLUCAP in the last 168 hours. Lipid Profile: No results for input(s): CHOL, HDL, LDLCALC, TRIG, CHOLHDL, LDLDIRECT in the last 72 hours. Thyroid Function Tests: No results for input(s): TSH, T4TOTAL, FREET4, T3FREE, THYROIDAB in the last 72 hours. Anemia Panel: No results for input(s): VITAMINB12, FOLATE, FERRITIN, TIBC, IRON, RETICCTPCT in the last 72 hours. Sepsis Labs: Recent Labs  Lab 03/29/20 0926 03/29/20 1339  LATICACIDVEN 2.4* 1.0    Recent Results (from the past 240 hour(s))  SARS Coronavirus 2 by RT PCR (hospital order, performed in [facility] hospital lab) Nasopharyngeal Nasopharyngeal Swab     Status: None   Collection Time: 03/29/20  2:25 PM   Specimen: Nasopharyngeal Swab  Result Value Ref Range Status   SARS Coronavirus 2 NEGATIVE NEGATIVE Final    Comment:  (NOTE) SARS-CoV-2 target nucleic acids are NOT DETECTED. The SARS-CoV-2 RNA is generally detectable in upper and lower respiratory specimens during the acute phase of infection. The lowest concentration of SARS-CoV-2 viral copies this assay can detect is 250 copies / mL. A negative result does not preclude SARS-CoV-2 infection and should not be used as the sole basis for treatment or other patient management decisions.  A negative result may occur with improper specimen collection / handling, submission of specimen other than nasopharyngeal swab, presence of viral mutation(s) within the areas targeted by this assay, and inadequate number of viral copies (<250 copies / mL). A negative result must be combined with clinical observations, patient history, and epidemiological information. Fact Sheet for Patients:   StrictlyIdeas.no Fact Sheet for Healthcare Providers: BankingDealers.co.za This test is not yet approved or cleared  by the [place] FDA and has been authorized for detection and/or diagnosis of SARS-CoV-2 by FDA under an Emergency Use Authorization (EUA).  This EUA will remain in effect (meaning this test can be used) for the duration of the COVID-19 declaration under Section 564(b)(1) of the Act, 21 U.S.C. section 360bbb-3(b)(1), unless the authorization is terminated  or revoked sooner. Performed at [facility], [address], [place], [place] [address]          Radiology Studies: US Renal  Result Date: 03/29/2020 CLINICAL DATA:  Hematuria.  Pyelonephritis. EXAM: RENAL / URINARY TRACT ULTRASOUND COMPLETE COMPARISON:  CT abdomen pelvis dated Mar 11, 2020. FINDINGS: Right Kidney: Renal measurements: 10.2 x 4.4 x 4.1 cm = volume: 94 mL . Echogenicity within normal limits. Unchanged mild hydronephrosis. No mass visualized. Left Kidney: Renal measurements: 10.6 x 4.0 x 4.1 cm = volume: 91 mL. Echogenicity within  normal limits. No mass or hydronephrosis visualized. Bladder: Appears normal for degree of bladder distention. Other: None. IMPRESSION: 1. Unchanged mild right hydronephrosis. Electronically Signed   By: Titus Dubin M.D.   On: 03/29/2020 13:49        Scheduled Meds: . amiodarone  200 mg Oral Daily  . amLODipine  10 mg Oral Daily  . busPIRone  5 mg Oral BID  . cholecalciferol  2,000 Units Oral Daily  . doxazosin  1 mg Oral BID  . irbesartan  300 mg Oral Daily  . levothyroxine  137 mcg Oral QAC breakfast  . metoprolol succinate  50 mg Oral Daily  . multivitamin with minerals  1 tablet Oral Daily  . rosuvastatin  40 mg Oral QPM  . sodium chloride flush  3 mL Intravenous Once   Continuous Infusions: . aztreonam 2 g (03/30/20 0619)     LOS: 0 days    Time spent: 28 minutes    Sharen Hones, MD [facility]   To contact the attending provider between 7A-7P or the covering provider during after hours 7P-7A, please log into the web site [email or web address] and access using universal [facility] password for that web site. If you do not have the password, please call the hospital operator.  03/30/2020, 8:40 AM

## 2020-03-30 NOTE — Telephone Encounter (Signed)
-----   Message from Billey Co, MD sent at 03/29/2020  4:39 PM EDT ----- Regarding: follow up Please schedule follow-up in 3 weeks for symptom check and PVR, thank you  Nickolas Madrid, MD 03/29/2020

## 2020-03-30 NOTE — Progress Notes (Signed)
Urology Inpatient Progress Note  Subjective: [name] is a 79 y.o. female admitted on 03/29/2020 with UTI in the setting of chronic mild right hydronephrosis and recent treatment for Pseudomonas and staph epidermidis UTI.   Urine culture pending, blood cultures pending with no growth at <24 hours.  On antibiotics as below.  She is afebrile, VSS.  WBC count down today, 7.1.  Creatinine down today, 0.79.  Today, patient reports feeling better.  She denies flank, low back, and pelvic pain.  Anti-infectives: Anti-infectives (From admission, onward)   Start     Dose/Rate Route Frequency Ordered Stop   03/29/20 2200  aztreonam (AZACTAM) 2 g in sodium chloride 0.9 % 100 mL IVPB     2 g 200 mL/hr over 30 Minutes Intravenous Every 8 hours 03/29/20 1458     03/29/20 1115  cefTRIAXone (ROCEPHIN) 2 g in sodium chloride 0.9 % 100 mL IVPB     2 g 200 mL/hr over 30 Minutes Intravenous  Once 03/29/20 1103 03/29/20 1258      Current Facility-Administered Medications  Medication Dose Route Frequency Provider Last Rate Last Admin  . acetaminophen (TYLENOL) tablet 650 mg  650 mg Oral Q6H PRN Ivor Costa, MD      . amiodarone (PACERONE) tablet 200 mg  200 mg Oral Daily Ivor Costa, MD   200 mg at 03/30/20 1034  . amLODipine (NORVASC) tablet 10 mg  10 mg Oral Daily Ivor Costa, MD   10 mg at 03/30/20 7902  . apixaban (ELIQUIS) tablet 5 mg  5 mg Oral BID Sharen Hones, MD   5 mg at 03/30/20 0858  . aztreonam (AZACTAM) 2 g in sodium chloride 0.9 % 100 mL IVPB  2 g Intravenous Q8H Ivor Costa, MD 200 mL/hr at 03/30/20 0619 2 g at 03/30/20 0619  . busPIRone (BUSPAR) tablet 5 mg  5 mg Oral BID Ivor Costa, MD   5 mg at 03/30/20 0829  . cholecalciferol (VITAMIN D3) tablet 2,000 Units  2,000 Units Oral Daily Ivor Costa, MD   2,000 Units at 03/30/20 [phone]  . doxazosin (CARDURA) tablet 1 mg  1 mg Oral BID Ivor Costa, MD   1 mg at 03/30/20 3532  . hydrALAZINE (APRESOLINE) injection 5 mg  5 mg Intravenous Q2H PRN Ivor Costa, MD      . irbesartan Levy Sjogren) tablet 300 mg  300 mg Oral Daily Ivor Costa, MD   300 mg at 03/30/20 1035  . levothyroxine (SYNTHROID) tablet 137 mcg  137 mcg Oral QAC breakfast Ivor Costa, MD   137 mcg at 03/30/20 [phone]  . metoprolol succinate (TOPROL-XL) 24 hr tablet 50 mg  50 mg Oral Daily Ivor Costa, MD      . multivitamin with minerals tablet 1 tablet  1 tablet Oral Daily Ivor Costa, MD   1 tablet at 03/30/20 [phone]  . ondansetron (ZOFRAN) injection 4 mg  4 mg Intravenous Q8H PRN Ivor Costa, MD      . rosuvastatin (CRESTOR) tablet 40 mg  40 mg Oral QPM Ivor Costa, MD   40 mg at 03/29/20 1833  . sodium chloride flush (NS) 0.9 % injection 3 mL  3 mL Intravenous Once Ivor Costa, MD      . temazepam (RESTORIL) capsule 15 mg  15 mg Oral QHS PRN Ivor Costa, MD   15 mg at 03/29/20 2327   Objective: Vital signs in last 24 hours: Temp:  [97.6 F (36.4 C)-97.9 F (36.6 C)] 97.9 F (36.6 C) (06/08  2800) Pulse Rate:  [53-65] 55 (06/08 0734) Resp:  [14-16] 14 (06/08 0734) BP: (120-155)/(41-58) 146/44 (06/08 0734) SpO2:  [97 %-100 %] 98 % (06/08 0734) Weight:  [57.8 kg] 57.8 kg (06/07 2116)  Intake/Output from previous day: 06/07 0701 - 06/08 0700 In: 1793.6 [I.V.:693.6; IV Piggyback:1100] Out: -  Intake/Output this shift: No intake/output data recorded.  Physical Exam Vitals and nursing note reviewed.  Constitutional:      General: She is not in acute distress.    Appearance: She is not ill-appearing, toxic-appearing or diaphoretic.  HENT:     Head: Normocephalic and atraumatic.  Pulmonary:     Effort: Pulmonary effort is normal. No respiratory distress.  Skin:    General: Skin is warm and dry.  Neurological:     Mental Status: She is alert and oriented to person, place, and time.  Psychiatric:        Mood and Affect: Mood normal.        Behavior: Behavior normal.    Lab Results:  Recent Labs    03/29/20 0926 03/30/20 0412  WBC 11.6* 7.1  HGB 12.7 11.5*  HCT 37.5 33.9*   PLT 216 200   BMET Recent Labs    03/29/20 0926 03/30/20 0412  NA 134* 139  K 3.8 3.8  CL 100 107  CO2 24 26  GLUCOSE 203* 112*  BUN 19 12  CREATININE 1.08* 0.79  CALCIUM 8.9 8.8*   PT/INR Recent Labs    03/29/20 1524  LABPROT 15.4*  INR 1.3*   Assessment & Plan: 79 year old female admitted with recurrent versus persistent UTI in the setting of chronic mild right hydronephrosis.  Clinically improving on broad-spectrum antibiotics.  Recommendations: -Follow urine culture; she will require 14 days of culture appropriate antibiotics -We will continue with plans for outpatient follow-up in our clinic in 2 to 3 weeks to discuss further imaging.  Options include CTU versus NM renal scan versus cystoscopy and bilateral retrograde pyelograms  Debroah Loop, PA-C 03/30/2020

## 2020-03-30 NOTE — Plan of Care (Signed)
  Problem: Education: Goal: Knowledge of General Education information will improve Description Including pain rating scale, medication(s)/side effects and non-pharmacologic comfort measures Outcome: Progressing   

## 2020-03-30 NOTE — Telephone Encounter (Signed)
-----   Message from Billey Co, MD sent at 03/30/2020  8:05 AM EDT ----- Regarding: follow up Please schedule clinic follow up with me in 2-3 weeks with PVR, thanks  Nickolas Madrid, MD 03/30/2020

## 2020-03-30 NOTE — Evaluation (Signed)
Occupational Therapy Evaluation Patient Details Name: [name] MRN: [id number] DOB: Dec 24, 1940 Today's Date: 03/30/2020    History of Present Illness Pt is a 79 y.o. female presenting to hospital 6/7 with UTI symptoms and 5/10 L low back aching.  Of note, pt evaluated in ED 5/20 and diagnosed with pyelonephritis.  Pt admitted with acute pyelonephritis and hydronephrosis of R kidney.  PMH includes anxiety, bronchitis, CHF, CKD, HLD, htn, a-fib, SVT, cardioversion; per chart recent back sx for L sided pinched nerve and pt reporting recent neck surgery about 5 weeks ago.   Clinical Impression   Pt seen for OT evaluation this date in setting of acute hospitalization with pyelonephritis. Pt reports feeling better this date. Pt demos good control for ADL transfers with no AD With no assist from OT, demos good safety awareness with fxl mobility with no AD using scanning technique d/t baseline low vision R eye and not having her contact lens in. Pt demos ability to perform standing ADLs with no physical assist with OT supv d/t nature of evaluation, but no cues req'ed. Pt overall appears to be at her reported functional baseline. Do not anticipate need for continued OT services in acute setting or at d/c.     Follow Up Recommendations  No OT follow up    Equipment Recommendations  None recommended by OT    Recommendations for Other Services       Precautions / Restrictions Precautions Precautions: Fall Restrictions Weight Bearing Restrictions: No      Mobility Bed Mobility               General bed mobility comments: pt up to chair pre/post session  Transfers Overall transfer level: Independent Equipment used: None                  Balance Overall balance assessment: Needs assistance Sitting-balance support: No upper extremity supported;Feet supported Sitting balance-Leahy Scale: Normal Sitting balance - Comments: steady sitting reaching outside BOS   Standing  balance support: No upper extremity supported;During functional activity Standing balance-Leahy Scale: Good                             ADL either performed or assessed with clinical judgement   ADL Overall ADL's : Modified independent                                       General ADL Comments: some slightly decreased standing balance and balance with fxl mobility which pt attributes to not having her R eye contac in and she wears these regularly at home. Overwise, pt performs all seated ADLs at indep level and standing at SBA level, no physical assist req'ed.     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     Perception     Praxis      Pertinent Vitals/Pain Pain Assessment: 0-10 Pain Score: 3  Pain Location: anterior neck more than left low back Pain Descriptors / Indicators: Aching Pain Intervention(s): Limited activity within patient's tolerance;Monitored during session     Hand Dominance     Extremity/Trunk Assessment Upper Extremity Assessment Upper Extremity Assessment:  Overall [facility] for tasks assessed   Lower Extremity Assessment Lower Extremity Assessment: Defer to PT evaluation;Overall [facility] for tasks assessed;Generalized weakness       Communication Communication Communication: No difficulties   Cognition Arousal/Alertness: Awake/alert Behavior During Therapy: WFL for tasks assessed/performed Overall Cognitive Status: Within Functional Limits for tasks assessed                                     General Comments       Exercises Other Exercises Other Exercises: OT facilitates education re: role of OT in acute setting. Pt with good understanding   Shoulder Instructions      Home Living  Family/patient expects to be discharged to:: Private residence Living Arrangements: Alone Available Help at Discharge: Friend(s);Available PRN/intermittently Type of Home: House Home Access: Stairs to enter [employer] of Steps: 2 Entrance Stairs-Rails: Left Home Layout: One level     Bathroom Shower/Tub: Teacher, early years/pre: Handicapped height     Home Equipment: Grab bars - tub/shower          Prior Functioning/Environment Level of Independence: Independent        Comments: Pt reports no falls in past 6 months.        OT Problem List: Decreased strength      OT Treatment/Interventions: Self-care/ADL training;Therapeutic activities    OT Goals(Current goals can be found in the care plan section) Acute Rehab OT Goals Patient Stated Goal: to go home OT Goal Formulation: Patient unable to participate in goal setting  OT Frequency:     Barriers to D/C:            Co-evaluation              AM-PAC OT "6 Clicks" Daily Activity     Outcome Measure Help from another person eating meals?: None Help from another person taking care of personal grooming?: None Help from another person toileting, which includes using toliet, bedpan, or urinal?: None Help from another person bathing (including washing, rinsing, drying)?: None Help from another person to put on and taking off regular upper body clothing?: None Help from another person to put on and taking off regular lower body clothing?: None 6 Click Score: 24   End of Session Equipment Utilized During Treatment: Gait belt Nurse Communication: Mobility status  Activity Tolerance: Patient tolerated treatment well Patient left: in chair;with call bell/phone within reach  OT Visit Diagnosis: Muscle weakness (generalized) (M62.81)                Time: 3664-4034 OT Time Calculation (min): 23 min Charges:  OT General Charges $OT Visit: 1 Visit OT Treatments $Self Care/Home Management :  8-22 mins  Gerrianne Scale, [place], OTR/L ascom [phone] 03/30/20, 3:09 PM

## 2020-03-30 NOTE — Evaluation (Addendum)
Physical Therapy Evaluation Patient Details Name: [name] MRN: [id number] DOB: [birth year] Today's Date: 03/30/2020   History of Present Illness  Pt is a 79 y.o. female presenting to hospital 6/7 with UTI symptoms and 5/10 L low back aching.  Of note, pt evaluated in ED 5/20 and diagnosed with pyelonephritis.  Pt admitted with acute pyelonephritis and hydronephrosis of R kidney.  PMH includes anxiety, bronchitis, CHF, CKD, HLD, htn, a-fib, SVT, cardioversion; per chart recent back sx for L sided pinched nerve and pt reporting recent neck surgery about 5 weeks ago.  Clinical Impression  Prior to hospital admission, pt was independent with ambulation; lives alone on main level of home with 2 STE with L railing.  Currently pt is independent with bed mobility and transfers, and CGA to SBA with ambulation around nursing loop 2x's (no AD).  Pt reporting she did not have her contacts in (and did not have glasses) so she felt a little unsteady d/t her vision; occasional mild unsteadiness noted during ambulation but pt able to self correct.  Pain 3/10 anterior neck (recent neck surgery) > L low back during session.  Pt would benefit from skilled PT to address noted impairments and functional limitations (see below for any additional details).  Upon hospital discharge, no further PT needs anticipated.    Follow Up Recommendations No PT follow up    Equipment Recommendations  None recommended by PT    Recommendations for Other Services       Precautions / Restrictions Precautions Precautions: Fall Restrictions Weight Bearing Restrictions: No      Mobility  Bed Mobility Overal bed mobility: Independent             General bed mobility comments: Semi-supine to sitting edge of bed without any noted difficulties  Transfers Overall transfer level: Independent Equipment used: None             General transfer comment: steady safe transfers noted  Ambulation/Gait Ambulation/Gait  assistance: Min guard;Supervision Gait Distance (Feet): 340 Feet(plus 20 feet bathroom to recliner) Assistive device: None Gait Pattern/deviations: Step-through pattern Gait velocity: mildly decreased   General Gait Details: occasional mild unsteadiness but pt able to self correct  Stairs            Wheelchair Mobility    Modified Rankin (Stroke Patients Only)       Balance Overall balance assessment: Needs assistance Sitting-balance support: No upper extremity supported;Feet supported Sitting balance-Leahy Scale: Normal Sitting balance - Comments: steady sitting reaching outside BOS   Standing balance support: No upper extremity supported;During functional activity Standing balance-Leahy Scale: Good Standing balance comment: occasional mild unsteadiness with ambulation but pt able to self correct                             Pertinent Vitals/Pain Pain Assessment: 0-10 Pain Score: 3  Pain Location: anterior neck more than left low back Pain Descriptors / Indicators: Aching Pain Intervention(s): Limited activity within patient's tolerance;Monitored during session;Repositioned  Vitals (HR and O2 on room air) stable and WFL throughout treatment session.    Home Living Family/patient expects to be discharged to:: Private residence Living Arrangements: Alone Available Help at Discharge: Friend(s);Available PRN/intermittently Type of Home: House Home Access: Stairs to enter Entrance Stairs-Rails: Left Entrance Stairs-Number of Steps: 2 Home Layout: One level(does not go to basement) Home Equipment: Grab bars - tub/shower      Prior Function Level of Independence: Independent  Comments: Pt reports no falls in past 6 months.     Hand Dominance        Extremity/Trunk Assessment   Upper Extremity Assessment Upper Extremity Assessment: Defer to OT evaluation    Lower Extremity Assessment Lower Extremity Assessment: Generalized weakness     Cervical / Trunk Assessment Cervical / Trunk Assessment: Normal  Communication   Communication: No difficulties  Cognition Arousal/Alertness: Awake/alert Behavior During Therapy: WFL for tasks assessed/performed Overall Cognitive Status: Within Functional Limits for tasks assessed                                        General Comments   Nursing cleared pt for participation in physical therapy.  Pt agreeable to PT session.    Exercises  Transfers and ambulation   Assessment/Plan    PT Assessment Patient needs continued PT services  PT Problem List Decreased strength;Decreased balance;Decreased mobility       PT Treatment Interventions DME instruction;Gait training;Stair training;Functional mobility training;Therapeutic activities;Therapeutic exercise;Balance training;Patient/family education    PT Goals (Current goals can be found in the Care Plan section)  Acute Rehab PT Goals Patient Stated Goal: to go home PT Goal Formulation: With patient Time For Goal Achievement: 04/13/20 Potential to Achieve Goals: Good    Frequency Min 2X/week   Barriers to discharge        Co-evaluation               AM-PAC PT "6 Clicks" Mobility  Outcome Measure Help needed turning from your back to your side while in a flat bed without using bedrails?: None Help needed moving from lying on your back to sitting on the side of a flat bed without using bedrails?: None Help needed moving to and from a bed to a chair (including a wheelchair)?: None Help needed standing up from a chair using your arms (e.g., wheelchair or bedside chair)?: None Help needed to walk in hospital room?: A Little Help needed climbing 3-5 steps with a railing? : A Little 6 Click Score: 22    End of Session Equipment Utilized During Treatment: Gait belt Activity Tolerance: Patient tolerated treatment well Patient left: in chair;with call bell/phone within reach;with chair alarm set Nurse  Communication: Mobility status;Precautions(via white board) PT Visit Diagnosis: Unsteadiness on feet (R26.81);Muscle weakness (generalized) (M62.81)    Time: 4585-9292 PT Time Calculation (min) (ACUTE ONLY): 32 min   Charges:   PT Evaluation $PT Eval Low Complexity: 1 Low PT Treatments $Therapeutic Activity: 8-22 mins        Leitha Bleak, PT 03/30/20, 9:54 AM

## 2020-03-31 ENCOUNTER — Encounter: Payer: Self-pay | Admitting: Internal Medicine

## 2020-03-31 LAB — CREATININE, SERUM
Creatinine, Ser: 0.77 mg/dL (ref 0.44–1.00)
GFR calc Af Amer: >60 mL/min (ref >60)
GFR calc non Af Amer: >60 mL/min (ref >60)

## 2020-03-31 MED ORDER — LEVOFLOXACIN 500 MG PO TABS
500.0000 mg | ORAL_TABLET | Freq: Every day | ORAL | 0 refills | Status: AC
Start: 2020-03-31 — End: 2020-04-07

## 2020-03-31 NOTE — Progress Notes (Signed)
Patient discharged to home in stable condition. Patient verbalized understanding of all discharge instructions, including follow up appointments.

## 2020-04-01 ENCOUNTER — Telehealth: Payer: Self-pay

## 2020-04-01 NOTE — Telephone Encounter (Signed)
Transition Care Management Follow-up Telephone Call  Date of discharge and from where: 03/31/20 [facility]  How have you been since you were released from the hospital? Pt states she is feeling weak but doing okay  Any questions or concerns? No   Items Reviewed:  Did the pt receive and understand the discharge instructions provided? Yes   Medications obtained and verified? Yes   Any new allergies since your discharge? No   Dietary orders reviewed? Yes  Do you have support at home? Yes   Functional Questionnaire: (I = Independent and D = Dependent) ADLs: I  Bathing/Dressing- I  Meal Prep- I  Eating- I  Maintaining continence- I  Transferring/Ambulation- I  Managing Meds- I  Follow up appointments reviewed:   PCP Hospital f/u appt confirmed? Yes  Scheduled to see Dr. Ancil Boozer on 04/02/20.  [facility] f/u appt confirmed? Yes  Scheduled to see Dr. Diamantina Providence on 04/21/20.  Are transportation arrangements needed? No   If their condition worsens, is the pt aware to call PCP or go to the Emergency Dept.? Yes  Was the patient provided with contact information for the PCP's office or ED? Yes  Was to pt encouraged to call back with questions or concerns? Yes

## 2020-04-02 ENCOUNTER — Ambulatory Visit (INDEPENDENT_AMBULATORY_CARE_PROVIDER_SITE_OTHER): Payer: Medicare Other | Admitting: Family Medicine

## 2020-04-02 ENCOUNTER — Other Ambulatory Visit: Payer: Self-pay

## 2020-04-02 ENCOUNTER — Encounter: Payer: Self-pay | Admitting: Family Medicine

## 2020-04-02 VITALS — BP 160/62 | HR 70 | Temp 97.1°F | Resp 16 | Ht 66.0 in | Wt 129.0 lb

## 2020-04-02 DIAGNOSIS — E785 Hyperlipidemia, unspecified: Secondary | ICD-10-CM

## 2020-04-02 DIAGNOSIS — I5042 Chronic combined systolic (congestive) and diastolic (congestive) heart failure: Secondary | ICD-10-CM

## 2020-04-02 DIAGNOSIS — R739 Hyperglycemia, unspecified: Secondary | ICD-10-CM

## 2020-04-02 DIAGNOSIS — Z09 Encounter for follow-up examination after completed treatment for conditions other than malignant neoplasm: Secondary | ICD-10-CM | POA: Diagnosis not present

## 2020-04-02 DIAGNOSIS — I10 Essential (primary) hypertension: Secondary | ICD-10-CM

## 2020-04-02 DIAGNOSIS — F411 Generalized anxiety disorder: Secondary | ICD-10-CM

## 2020-04-02 DIAGNOSIS — N1831 Chronic kidney disease, stage 3a: Secondary | ICD-10-CM

## 2020-04-02 DIAGNOSIS — I482 Chronic atrial fibrillation, unspecified: Secondary | ICD-10-CM

## 2020-04-02 DIAGNOSIS — I272 Pulmonary hypertension, unspecified: Secondary | ICD-10-CM | POA: Diagnosis not present

## 2020-04-02 DIAGNOSIS — D692 Other nonthrombocytopenic purpura: Secondary | ICD-10-CM

## 2020-04-02 DIAGNOSIS — I471 Supraventricular tachycardia: Secondary | ICD-10-CM

## 2020-04-02 NOTE — Discharge Summary (Signed)
Physician Discharge Summary  [name] TKW:[id number] DOB: 03/20/1941 DOA: 03/29/2020  PCP: Steele Sizer, MD  Admit date: 03/29/2020 Discharge date: 04/02/2020  Discharge disposition: Home   Recommendations for Outpatient Follow-Up:   Outpatient follow-up with PCP within 1 week of discharge Outpatient follow-up with urologist in 2 to 3 weeks   Discharge Diagnosis:   Principal Problem:   Acute pyelonephritis Active Problems:   Hypertension   Hypothyroid   Hyperlipidemia   Chronic combined systolic (congestive) and diastolic (congestive) heart failure (HCC)   CKD (chronic kidney disease), stage III   Atrial fibrillation (HCC)   Hydronephrosis of right kidney   Depression   Pyelonephritis    Discharge Condition: Stable.  Diet recommendation: Heart healthy diet  Code status: DNR    Hospital Course:   [name] is a 79 year old female with history of chronic combined systolic and diastolic congestive heart failure, chronic kidney disease stage IIIa, essential hypertension, hypothyroidism, atrial fibrillation on chronic anticoagulation who presented to the hospital with complaints of dysuria, hematuria and left flank pain. Patient had 3 episodes of UTI/pyelonephritis recently.  Most recent urine culture was positive for coag negative staph and Pseudomonas.  She was admitted to the hospital for acute left pyelonephritis but she did not meet sepsis criteria. Renal ultrasound showed mild right-sided hydronephrosis.    Urine culture did not show any growth.  She was given a prescription for Levaquin based on previous urine culture sensitivity report.  Risk and benefits of Levaquin were discussed with the patient and her husband at the bedside.  She was evaluated by urology who recommended outpatient work-up after completion of antibiotics.  She is deemed stable for discharge to home.      Discharge Exam:   Vitals:   03/31/20 0431 03/31/20 0735  BP: (!)  130/41 (!) 144/49  Pulse: (!) 56 68  Resp: 16 18  Temp: (!) 97.5 F (36.4 C) 97.9 F (36.6 C)  SpO2: 100% 100%   Vitals:   03/30/20 1545 03/30/20 2012 03/31/20 0431 03/31/20 0735  BP: (!) 127/53 (!) 135/46 (!) 130/41 (!) 144/49  Pulse: (!) 53 (!) 54 (!) 56 68  Resp: 18 16 16 18   Temp: 97.8 F (36.6 C) 98 F (36.7 C) (!) 97.5 F (36.4 C) 97.9 F (36.6 C)  TempSrc: Oral Oral Oral Oral  SpO2: 100% 98% 100% 100%  Weight:      Height:         GEN: NAD SKIN: No rash EYES: EOMI ENT: MMM CV: RRR PULM: CTA B ABD: soft, ND, NT, +BS CNS: AAO x 3, non focal EXT: No edema or tenderness   The results of significant diagnostics from this hospitalization (including imaging, microbiology, ancillary and laboratory) are listed below for reference.     Procedures and Diagnostic Studies:   US Renal  Result Date: 03/29/2020 CLINICAL DATA:  Hematuria.  Pyelonephritis. EXAM: RENAL / URINARY TRACT ULTRASOUND COMPLETE COMPARISON:  CT abdomen pelvis dated Mar 11, 2020. FINDINGS: Right Kidney: Renal measurements: 10.2 x 4.4 x 4.1 cm = volume: 94 mL . Echogenicity within normal limits. Unchanged mild hydronephrosis. No mass visualized. Left Kidney: Renal measurements: 10.6 x 4.0 x 4.1 cm = volume: 91 mL. Echogenicity within normal limits. No mass or hydronephrosis visualized. Bladder: Appears normal for degree of bladder distention. Other: None. IMPRESSION: 1. Unchanged mild right hydronephrosis. Electronically Signed   By: Titus Dubin M.D.   On: 03/29/2020 13:49     Labs:   Basic  Metabolic Panel: Recent Labs  Lab 03/29/20 0926 03/30/20 0412 03/31/20 0449  NA 134* 139  --   K 3.8 3.8  --   CL 100 107  --   CO2 24 26  --   GLUCOSE 203* 112*  --   BUN 19 12  --   CREATININE 1.08* 0.79 0.77  CALCIUM 8.9 8.8*  --    GFR Estimated Creatinine Clearance: 53.7 mL/min (by C-G formula based on SCr of 0.77 mg/dL). Liver Function Tests: Recent Labs  Lab 03/29/20 0926  AST 34  ALT 27   ALKPHOS 64  BILITOT 0.7  PROT 7.0  ALBUMIN 4.4   No results for input(s): LIPASE, AMYLASE in the last 168 hours. No results for input(s): AMMONIA in the last 168 hours. Coagulation profile Recent Labs  Lab 03/29/20 1524  INR 1.3*    CBC: Recent Labs  Lab 03/29/20 0926 03/30/20 0412  WBC 11.6* 7.1  NEUTROABS 9.8*  --   HGB 12.7 11.5*  HCT 37.5 33.9*  MCV 94.5 95.0  PLT 216 200   Cardiac Enzymes: No results for input(s): CKTOTAL, CKMB, CKMBINDEX, TROPONINI in the last 168 hours. BNP: Invalid input(s): POCBNP CBG: No results for input(s): GLUCAP in the last 168 hours. D-Dimer No results for input(s): DDIMER in the last 72 hours. Hgb A1c No results for input(s): HGBA1C in the last 72 hours. Lipid Profile No results for input(s): CHOL, HDL, LDLCALC, TRIG, CHOLHDL, LDLDIRECT in the last 72 hours. Thyroid function studies No results for input(s): TSH, T4TOTAL, T3FREE, THYROIDAB in the last 72 hours.  Invalid input(s): FREET3 Anemia work up No results for input(s): VITAMINB12, FOLATE, FERRITIN, TIBC, IRON, RETICCTPCT in the last 72 hours. Microbiology Recent Results (from the past 240 hour(s))  Urine culture     Status: Abnormal   Collection Time: 03/29/20  9:26 AM   Specimen: Urine, Random  Result Value Ref [place] Status   Specimen Description   Final    URINE, RANDOM Performed at [facility], [address], [place], [place] [address]    Special Requests   Final    NONE Performed at [facility], [address] [place], [place] [address]    Culture MULTIPLE SPECIES PRESENT, SUGGEST RECOLLECTION (A)  Final   Report Status 03/30/2020 FINAL  Final  CULTURE, BLOOD (ROUTINE X 2) w Reflex to ID Panel     Status: None (Preliminary result)   Collection Time: 03/29/20  2:21 PM   Specimen: BLOOD  Result Value Ref [place] Status   Specimen Description BLOOD BLOOD RIGHT FOREARM  Final   Special Requests   Final    BOTTLES DRAWN AEROBIC AND  ANAEROBIC Blood Culture adequate volume   Culture   Final    NO GROWTH 4 DAYS Performed at [facility], [address] [place], [place] [address]    Report Status PENDING  Incomplete  CULTURE, BLOOD (ROUTINE X 2) w Reflex to ID Panel     Status: None (Preliminary result)   Collection Time: 03/29/20  2:21 PM   Specimen: BLOOD  Result Value Ref [place] Status   Specimen Description BLOOD RIGHT ANTECUBITAL  Final   Special Requests   Final    BOTTLES DRAWN AEROBIC AND ANAEROBIC Blood Culture adequate volume   Culture   Final    NO GROWTH 4 DAYS Performed at [facility], [address] [place], [place] [address]    Report Status PENDING  Incomplete  SARS Coronavirus 2 by  RT PCR (hospital order, performed in [facility] hospital lab) Nasopharyngeal Nasopharyngeal Swab     Status: None   Collection Time: 03/29/20  2:25 PM   Specimen: Nasopharyngeal Swab  Result Value Ref [place] Status   SARS Coronavirus 2 NEGATIVE NEGATIVE Final    Comment: (NOTE) SARS-CoV-2 target nucleic acids are NOT DETECTED. The SARS-CoV-2 RNA is generally detectable in upper and lower respiratory specimens during the acute phase of infection. The lowest concentration of SARS-CoV-2 viral copies this assay can detect is 250 copies / mL. A negative result does not preclude SARS-CoV-2 infection and should not be used as the sole basis for treatment or other patient management decisions.  A negative result may occur with improper specimen collection / handling, submission of specimen other than nasopharyngeal swab, presence of viral mutation(s) within the areas targeted by this assay, and inadequate number of viral copies (<250 copies / mL). A negative result must be combined with clinical observations, patient history, and epidemiological information. Fact Sheet for Patients:   StrictlyIdeas.no Fact Sheet for Healthcare  Providers: BankingDealers.co.za This test is not yet approved or cleared  by the [place] FDA and has been authorized for detection and/or diagnosis of SARS-CoV-2 by FDA under an Emergency Use Authorization (EUA).  This EUA will remain in effect (meaning this test can be used) for the duration of the COVID-19 declaration under Section 564(b)(1) of the Act, 21 U.S.C. section 360bbb-3(b)(1), unless the authorization is terminated or revoked sooner. Performed at [facility], [address] [place], [place] [address]      Discharge Instructions:   Discharge Instructions    Diet - low sodium heart healthy   Complete by: As directed    Lactose free for 2 weeks   Increase activity slowly   Complete by: As directed      Allergies as of 03/31/2020      Reactions   Penicillins Swelling, Rash   Has patient had a PCN reaction causing immediate rash, facial/tongue/throat swelling, SOB or lightheadedness with hypotension: Yes Has patient had a PCN reaction causing severe rash involving mucus membranes or skin necrosis: No Has patient had a PCN reaction that required hospitalization: No Has patient had a PCN reaction occurring within the last 10 years: No If all of the above answers are "NO", then may proceed with Cephalosporin use.   Codeine Nausea And Vomiting   Erythromycin Itching, Swelling   Septra [sulfamethoxazole-trimethoprim] Swelling      Medication List    TAKE these medications   acetaminophen 650 MG CR tablet Commonly known as: TYLENOL Take 650-1,300 mg by mouth every 8 (eight) hours as needed for pain.   amiodarone 200 MG tablet Commonly known as: PACERONE TAKE 1 TABLET(200 MG) BY MOUTH DAILY What changed: See the new instructions.   amLODipine 10 MG tablet Commonly known as: NORVASC Take 1 tablet (10 mg total) by mouth daily.   apixaban 5 MG Tabs tablet Commonly known as: Eliquis Take 1 tablet (5 mg total) by mouth 2 (two)  times daily.   busPIRone 5 MG tablet Commonly known as: BUSPAR TAKE 1 TABLET(5 MG) BY MOUTH TWICE DAILY What changed: See the new instructions.   cholecalciferol 1000 units tablet Commonly known as: VITAMIN D Take 2,000 Units by mouth daily.   doxazosin 1 MG tablet Commonly known as: CARDURA TAKE 1 TABLET(1 MG) BY MOUTH TWICE DAILY What changed: See the new instructions.   furosemide 20 MG tablet Commonly known as: LASIX Take 20 mg  by mouth See admin instructions. Take 1 tablet (20mg ) by mouth every morning - take 1 additional tablet (20mg ) after lunch if needed for swelling   levofloxacin 500 MG tablet Commonly known as: Levaquin Take 1 tablet (500 mg total) by mouth daily for 7 days.   levothyroxine 137 MCG tablet Commonly known as: SYNTHROID TAKE 1 TABLET(137 MCG) BY MOUTH EVERY MORNING What changed: See the new instructions.   metoprolol succinate 50 MG 24 hr tablet Commonly known as: TOPROL-XL Take with or immediately following a meal. What changed:   how much to take  how to take this  when to take this  additional instructions   multivitamin with minerals Tabs tablet Take 1 tablet by mouth daily.   potassium chloride 10 MEQ tablet Commonly known as: KLOR-CON Take 10 mEq by mouth See admin instructions. Take 1 tablet (49meq) by mouth every morning - take 1 additional tablet (65meq) after lunch if taking furosemide   rosuvastatin 40 MG tablet Commonly known as: CRESTOR TAKE 1 TABLET(40 MG) BY MOUTH DAILY What changed: See the new instructions.   temazepam 15 MG capsule Commonly known as: RESTORIL TAKE 1 CAPSULE(15 MG) BY MOUTH AT BEDTIME AS NEEDED FOR SLEEP What changed: See the new instructions.   valsartan 320 MG tablet Commonly known as: DIOVAN TAKE 1 TABLET(320 MG) BY MOUTH DAILY What changed:   how much to take  how to take this  when to take this  additional instructions       Follow-up Information    Steele Sizer, MD Follow up  in 1 week(s).   Specialty: Family Medicine Contact information: [address] [place] [address] [phone]        Minna Merritts, MD .   Specialty: Cardiology Contact information: [address] [place] [address] [phone]        Billey Co, MD. Schedule an appointment as soon as possible for a visit in 1 week(s).   Specialty: Urology Contact information: [place] [address] [phone]                Time coordinating discharge: 29 minutes  Signed:  Jennye Boroughs  [facility] 04/02/2020, 5:04 PM

## 2020-04-02 NOTE — Progress Notes (Signed)
Name: [name]   MRN: [id number]    DOB: Aug 17, 1941   Date:04/02/2020       Progress Note  Subjective  Chief Complaint  Chief Complaint  Patient presents with  . Hypertension  . Atrial Fibrillation  . Hypothyroidism  . Anxiety  . Depression    HPI  Hospital discharge follow up: she went to [facility] on May 20 th for evaluation of right flank pain and hematuria  she was advised to be admitted because of history of pyelonephritis but she decided to go home instead with rx of Cipro, she felt better initially but on 06/07 -03/31/2020  because she woke up that day with worsening of pain and also dysuria and recurrence of hematuria. CT showed right mild right hydronephrosis,  WBC was elevated, kidney function was down to 46, but improved to above 60 prior to discharge. Sodium was low but also normalized. Glucose was high but also normalized and had a normal A1C. She was sent home on Levaquin and has not completed medication yet.  Dr. Franki Monte was consulted and plans on seeing patient in 3 weeks for possible: CT urogram and cystoscopy vs. NM renal scan vs. cystoscopy and bilateral retrograde pyelograms for further evaluation of the subtle right-sided hydronephrosis Reviewed all labs and medication list with patient   S/p neck fusion: it was done by Dr. Larose Hires April 2021 and is doing well, still has some pain, but radiculitis down left arm has resolved  Carotid calcification: found on x-ray of neck. She also had a vascular study done back in 2011 that showed mild plaque formationon right.Sheis now on Crestor and Eliquis. Off aspirin since ablation 04/2018. She denies headache or dizziness or double vision, she still has intermittent  balance problems - but states secondary to visual disturbance ( she can see better from left eye than right). She went back to Dr. Erven Colla and had ABI that was normal, carotid doppler was due in 2021, but Dr. Rockey Situ told her not to worry about doing it at this time. She  also has a history of pulmonary hypertension - under the care of Dr. Rockey Situ   HTN: bp at [facility] usually controlled , 130's/60'sbut she states when she drinks water right away it gets better. No chest pain but has occasional palpitation has been less often usually once a week, but it was present during hospital visit   Afib: she had a cardioversion in June 2019  and another one in July2019,she has intermittent palpitationat most once  a week,but denies associated chest pain episodes lasts less than one minute rate controlled with amiodarone and metoprolol, but down to one pill daily and tolerating medication well.   CHF chronic: she is down to one pillow at night.She has mild sob with moderate activity- like when doing yard work, no chest pain, she has intermittent lower extremity edema and is taking extra lasix and potassium at lunch prn as instructed by Dr. Rockey Situ .On ARB , beta blocker and Eliquis, tolerating medication well and is compliant   Senile Purpura: both arms and legs, on eliquis, had some hematuria recently but likely from UTI   GAD: doing well on Buspar twice daily, she also takes temazepam qhs prn and it helps her sleep,she states taking Temazepam occasionally only for sleep   Hypothyroidism: doing well on medication, denies hair loss, denies constipationor difficulty swallowing, reviewed last labs   Patient Active Problem List   Diagnosis Date Noted  . Pyelonephritis 03/30/2020  . Acute pyelonephritis  03/29/2020  . Hydronephrosis of right kidney 03/29/2020  . Depression 03/29/2020  . Chronic heart failure ([place]) 05/24/2018  . Atrial fibrillation ([place]) 05/24/2018  . CKD (chronic kidney disease), stage III 05/17/2018  . 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 Shortness of breath 05/07/2018  . Pulmonary hypertension, unspecified ([place]) 04/05/2018  . Chronic combined systolic (congestive) and diastolic (congestive) heart failure ([place]) 04/05/2018  . 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 Leg pain 01/04/2018  . PAD (peripheral artery disease) ([place]) 01/04/2018  . Carotid artery calcification, bilateral 12/19/2017  . Cervical radiculitis 12/19/2017  . Hyperglycemia 03/06/2016  . Hypothyroid 04/21/2015  . 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 Anxiety 04/21/2015  . Hyperlipidemia 04/21/2015  . Diuretic-induced hypokalemia 04/21/2015  . Palpitations 12/18/2012  . Hypertension 05/27/2012    Past Surgical History:  Procedure Laterality Date  . CARDIOVERSION N/A 03/26/2018   Procedure: CARDIOVERSION;  Surgeon: Nelva Bush, MD;  Location: [facility] ORS;  Service: Cardiovascular;  Laterality: N/A;  . CARDIOVERSION N/A 04/24/2018   Procedure: CARDIOVERSION;  Surgeon: Minna Merritts, MD;  Location: [facility] ORS;  Service: Cardiovascular;  Laterality: N/A;  . ELECTROPHYSIOLOGY STUDY N/A 02/27/2012   Procedure: ELECTROPHYSIOLOGY STUDY;  Surgeon: Evans Lance, MD;  Location: [facility] CATH LAB;  Service: Cardiovascular;  Laterality: N/A;  . EPS and ablation for SVT  5/13   slow pathway ablation by Dr Lovena Le  . EYE SURGERY     surgery for detatched retina  . SUPRAVENTRICULAR TACHYCARDIA ABLATION N/A 02/27/2012   Procedure: SUPRAVENTRICULAR TACHYCARDIA ABLATION;  Surgeon: Evans Lance, MD;  Location: [facility] CATH LAB;  Service: Cardiovascular;  Laterality: N/A;    Family History  Problem Relation Age of Onset  . Alzheimer's disease Mother   . Stroke Father   . Breast cancer Father   . Heart disease Father        Pacemaker  . Breast cancer Paternal Aunt   . Kidney cancer Maternal Grandmother   . Alzheimer's disease Maternal Grandfather   . Thyroid disease Paternal Grandmother   . Stroke Paternal Grandfather     Social History   Tobacco Use  . Smoking status: Never Smoker  . Smokeless tobacco: Never Used  . Tobacco comment: smoking cessation materials not required  Substance Use Topics  . Alcohol use: No     Current  Outpatient Medications:  .  acetaminophen (TYLENOL) 650 MG CR tablet, Take 650-1,300 mg by mouth every 8 (eight) hours as needed for pain. , Disp: , Rfl:  .  amiodarone (PACERONE) 200 MG tablet, TAKE 1 TABLET(200 MG) BY MOUTH DAILY (Patient taking differently: Take 200 mg by mouth daily. ), Disp: 90 tablet, Rfl: 1 .  amLODipine (NORVASC) 10 MG tablet, Take 1 tablet (10 mg total) by mouth daily., Disp: 90 tablet, Rfl: 3 .  apixaban (ELIQUIS) 5 MG TABS tablet, Take 1 tablet (5 mg total) by mouth 2 (two) times daily., Disp: 60 tablet, Rfl: 5 .  busPIRone (BUSPAR) 5 MG tablet, TAKE 1 TABLET(5 MG) BY MOUTH TWICE DAILY (Patient taking differently: Take 5 mg by mouth 2 (two) times daily. ), Disp: 180 tablet, Rfl: 0 .  cholecalciferol (VITAMIN D) 1000 UNITS tablet, Take 2,000 Units by mouth daily., Disp: , Rfl:  .  doxazosin (CARDURA) 1 MG tablet, TAKE 1 TABLET(1 MG) BY MOUTH TWICE DAILY (Patient taking differently: Take 1 mg by mouth 2 (two) times daily. ), Disp: 180 tablet, Rfl: 0 .  furosemide (LASIX) 20 MG tablet, Take  20 mg by mouth See admin instructions. Take 1 tablet (20mg ) by mouth every morning - take 1 additional tablet (20mg ) after lunch if needed for swelling, Disp: , Rfl:  .  levofloxacin (LEVAQUIN) 500 MG tablet, Take 1 tablet (500 mg total) by mouth daily for 7 days., Disp: 7 tablet, Rfl: 0 .  levothyroxine (SYNTHROID) 137 MCG tablet, TAKE 1 TABLET(137 MCG) BY MOUTH EVERY MORNING (Patient taking differently: Take 137 mcg by mouth daily before breakfast. ), Disp: 90 tablet, Rfl: 1 .  metoprolol succinate (TOPROL-XL) 50 MG 24 hr tablet, Take with or immediately following a meal. (Patient taking differently: Take 50 mg by mouth daily. ), Disp: 90 tablet, Rfl: 0 .  Multiple Vitamin (MULITIVITAMIN WITH MINERALS) TABS, Take 1 tablet by mouth daily., Disp: , Rfl:  .  potassium chloride (KLOR-CON) 10 MEQ tablet, Take 10 mEq by mouth See admin instructions. Take 1 tablet (45meq) by mouth every morning -  take 1 additional tablet (41meq) after lunch if taking furosemide, Disp: , Rfl:  .  rosuvastatin (CRESTOR) 40 MG tablet, TAKE 1 TABLET(40 MG) BY MOUTH DAILY (Patient taking differently: Take 40 mg by mouth every evening. ), Disp: 90 tablet, Rfl: 3 .  temazepam (RESTORIL) 15 MG capsule, TAKE 1 CAPSULE(15 MG) BY MOUTH AT BEDTIME AS NEEDED FOR SLEEP (Patient taking differently: Take 15 mg by mouth at bedtime as needed for sleep. ), Disp: 30 capsule, Rfl: 2 .  valsartan (DIOVAN) 320 MG tablet, TAKE 1 TABLET(320 MG) BY MOUTH DAILY (Patient taking differently: Take 320 mg by mouth daily. ), Disp: 90 tablet, Rfl: 3  Allergies  Allergen Reactions  . Penicillins Swelling and Rash    Has patient had a PCN reaction causing immediate rash, facial/tongue/throat swelling, SOB or lightheadedness with hypotension: Yes Has patient had a PCN reaction causing severe rash involving mucus membranes or skin necrosis: No Has patient had a PCN reaction that required hospitalization: No Has patient had a PCN reaction occurring within the last 10 years: No If all of the above answers are "NO", then may proceed with Cephalosporin use.   . Codeine Nausea And Vomiting  . Erythromycin Itching and Swelling  . Septra [Sulfamethoxazole-Trimethoprim] Swelling    I personally reviewed active problem list, medication list, allergies, family history, social history, health maintenance with the patient/caregiver today.   ROS  Constitutional: Negative for fever or weight change.  Respiratory: Negative for cough , but has intermittent  shortness of breath with activity    Cardiovascular: Negative for chest pain , positive for intermittent palpitations.  Gastrointestinal: Negative for abdominal pain, no bowel changes.  Musculoskeletal: Negative for gait problem or joint swelling.  Skin: Negative for rash.  Neurological: Negative for dizziness or headache.  No other specific complaints in a complete review of systems (except  as listed in HPI above).  Objective  Vitals:   04/02/20 0920  BP: (!) 150/70  Pulse: 70  Resp: 16  Temp: (!) 97.1 F (36.2 C)  TempSrc: Temporal  SpO2: 99%  Weight: 129 lb (58.5 kg)  Height: 5\' 6"  (1.676 m)    Body mass index is 20.82 kg/m.  Physical Exam  Constitutional: Patient appears well-developed and well-nourished.No distress.  HEENT: head atraumatic, normocephalic, pupils equal and reactive to light,  neck supple, scar in good aspect anterior neck  Cardiovascular: Normal rate, regular rhythm and normal heart sounds.  2/6 systolic ejection  murmur heard. Trace BLE edema. Pulmonary/Chest: Effort normal and breath sounds normal. No respiratory distress. Abdominal:  Soft.  There is no tenderness. Negative CVA tenderness  Psychiatric: Patient has a normal mood and affect. behavior is normal. Judgment and thought content normal.  Recent Results (from the past 2160 hour(s))  POCT urinalysis dipstick     Status: Abnormal   Collection Time: 02/02/20  2:02 PM  Result Value Ref Range   Color, UA yellow    Clarity, UA clear    Glucose, UA Negative Negative   Bilirubin, UA neg    Ketones, UA neg    Spec Grav, UA 1.015 1.010 - 1.025   Blood, UA neg    pH, UA 5.0 5.0 - 8.0   Protein, UA Positive (A) Negative   Urobilinogen, UA 0.2 0.2 or 1.0 E.U./dL   Nitrite, UA negative    Leukocytes, UA Negative Negative   Appearance clear    Odor none   CULTURE, URINE COMPREHENSIVE     Status: Abnormal   Collection Time: 02/02/20  2:30 PM   Specimen: Urine  Result Value Ref Range   MICRO NUMBER: [id number]    SPECIMEN QUALITY: Adequate    Source OTHER (SPECIFY)    STATUS: FINAL    ISOLATE 1: Gram positive cocci isolated (A)     Comment: 1,000-9,000 CFU/ML of Gram positive cocci isolated Gram positive cocci isolated Gram negative bacilli isolated May represent colonizers from external and internal genitalia. No further testing (including susceptibility) will be performed.  BASIC  METABOLIC PANEL WITH GFR     Status: Abnormal   Collection Time: 02/02/20  2:30 PM  Result Value Ref Range   Glucose, Bld 156 (H) 65 - 99 mg/dL    Comment: .            Fasting reference interval . For someone without known diabetes, a glucose value >125 mg/dL indicates that they may have diabetes and this should be confirmed with a follow-up test. .    BUN 22 7 - 25 mg/dL   Creat 1.25 (H) 0.60 - 0.93 mg/dL    Comment: For patients >40 years of age, the reference limit for Creatinine is approximately 13% higher for people identified as African-American. .    GFR, Est Non African American 41 (L) > OR = 60 mL/min/1.47m2   GFR, Est African American 48 (L) > OR = 60 mL/min/1.91m2   BUN/Creatinine Ratio 18 6 - 22 (calc)   Sodium 135 135 - 146 mmol/L   Potassium 4.1 3.5 - 5.3 mmol/L   Chloride 101 98 - 110 mmol/L   CO2 26 20 - 32 mmol/L   Calcium 9.6 8.6 - 10.4 mg/dL  Urinalysis, Complete w Microscopic     Status: Abnormal   Collection Time: 03/11/20  8:47 AM  Result Value Ref Range   Color, Urine ORANGE (A) YELLOW   APPearance TURBID (A) CLEAR   Specific Gravity, Urine 1.013 1.005 - 1.030   pH 5.0 5.0 - 8.0   Glucose, UA NEGATIVE NEGATIVE mg/dL   Hgb urine dipstick LARGE (A) NEGATIVE   Bilirubin Urine NEGATIVE NEGATIVE   Ketones, ur NEGATIVE NEGATIVE mg/dL   Protein, ur 100 (A) NEGATIVE mg/dL   Nitrite NEGATIVE NEGATIVE   Leukocytes,Ua LARGE (A) NEGATIVE   RBC / HPF >50 (H) 0 - 5 RBC/hpf   WBC, UA >50 (H) 0 - 5 WBC/hpf   Bacteria, UA NONE SEEN NONE SEEN   Squamous Epithelial / LPF NONE SEEN 0 - 5    Comment: Performed at [facility], [address] [place], [place]  [address]  CBC     Status: Abnormal   Collection Time: 03/11/20  8:47 AM  Result Value Ref Range   WBC 13.1 (H) 4.0 - 10.5 K/uL   RBC 3.66 (L) 3.87 - 5.11 MIL/uL   Hemoglobin 12.0 12.0 - 15.0 g/dL   HCT 35.9 (L) 36 - 46 %   MCV 98.1 80.0 - 100.0 fL   MCH 32.8 26.0 - 34.0 pg   MCHC 33.4 30.0 -  36.0 g/dL   RDW 13.7 11.5 - 15.5 %   Platelets 225 150 - 400 K/uL   nRBC 0.0 0.0 - 0.2 %    Comment: Performed at [facility], [address], [place], [place] [address]  Basic metabolic panel     Status: Abnormal   Collection Time: 03/11/20  8:47 AM  Result Value Ref Range   Sodium 137 135 - 145 mmol/L   Potassium 4.3 3.5 - 5.1 mmol/L   Chloride 104 98 - 111 mmol/L   CO2 23 22 - 32 mmol/L   Glucose, Bld 162 (H) 70 - 99 mg/dL    Comment: Glucose reference range applies only to samples taken after fasting for at least 8 hours.   BUN 22 8 - 23 mg/dL   Creatinine, Ser 1.14 (H) 0.44 - 1.00 mg/dL   Calcium 9.1 8.9 - 10.3 mg/dL   GFR calc non Af Amer 46 (L) >60 mL/min   GFR calc Af Amer 54 (L) >60 mL/min   Anion gap 10 5 - 15    Comment: Performed at [facility], [address] [place], [place] [address]  Urine culture     Status: Abnormal   Collection Time: 03/11/20  8:47 AM   Specimen: Urine, Random  Result Value Ref Range   Specimen Description      URINE, RANDOM Performed at [facility], [address], [place], [place] [address]    Special Requests      NONE Performed at [facility], [place], [place] [address]    Culture (A)     30,000 COLONIES/mL PSEUDOMONAS AERUGINOSA 30,000 COLONIES/mL STAPHYLOCOCCUS EPIDERMIDIS    Report Status 03/13/2020 FINAL    Organism ID, Bacteria PSEUDOMONAS AERUGINOSA (A)    Organism ID, Bacteria STAPHYLOCOCCUS EPIDERMIDIS (A)       Susceptibility   Pseudomonas aeruginosa - MIC*    CEFTAZIDIME 2 SENSITIVE Sensitive     CIPROFLOXACIN <=0.25 SENSITIVE Sensitive     GENTAMICIN <=1 SENSITIVE Sensitive     IMIPENEM <=0.25 SENSITIVE Sensitive     PIP/TAZO <=4 SENSITIVE Sensitive     CEFEPIME <=1 SENSITIVE Sensitive     * 30,000 COLONIES/mL PSEUDOMONAS AERUGINOSA   Staphylococcus epidermidis - MIC*    CIPROFLOXACIN <=0.5 SENSITIVE Sensitive     GENTAMICIN <=0.5 SENSITIVE Sensitive      NITROFURANTOIN <=16 SENSITIVE Sensitive     OXACILLIN <=0.25 SENSITIVE Sensitive     TETRACYCLINE <=1 SENSITIVE Sensitive     VANCOMYCIN 1 SENSITIVE Sensitive     TRIMETH/SULFA <=10 SENSITIVE Sensitive     CLINDAMYCIN <=0.25 SENSITIVE Sensitive     RIFAMPIN <=0.5 SENSITIVE Sensitive     Inducible Clindamycin NEGATIVE Sensitive     * 30,000 COLONIES/mL STAPHYLOCOCCUS EPIDERMIDIS  Lactic acid, plasma     Status: Abnormal   Collection Time: 03/29/20  9:26 AM  Result Value Ref Range   Lactic Acid, Venous 2.4 (HH) 0.5 - 1.9 mmol/L    Comment: CRITICAL RESULT CALLED TO, READ BACK  BY AND VERIFIED WITH ANNA HOLT RN AT 2423 ON 03/29/20 SNG Performed at [facility], [address] [place], [place] [address]   Comprehensive metabolic panel     Status: Abnormal   Collection Time: 03/29/20  9:26 AM  Result Value Ref Range   Sodium 134 (L) 135 - 145 mmol/L   Potassium 3.8 3.5 - 5.1 mmol/L   Chloride 100 98 - 111 mmol/L   CO2 24 22 - 32 mmol/L   Glucose, Bld 203 (H) 70 - 99 mg/dL    Comment: Glucose reference range applies only to samples taken after fasting for at least 8 hours.   BUN 19 8 - 23 mg/dL   Creatinine, Ser 1.08 (H) 0.44 - 1.00 mg/dL   Calcium 8.9 8.9 - 10.3 mg/dL   Total Protein 7.0 6.5 - 8.1 g/dL   Albumin 4.4 3.5 - 5.0 g/dL   AST 34 15 - 41 U/L   ALT 27 0 - 44 U/L   Alkaline Phosphatase 64 38 - 126 U/L   Total Bilirubin 0.7 0.3 - 1.2 mg/dL   GFR calc non Af Amer 49 (L) >60 mL/min   GFR calc Af Amer 57 (L) >60 mL/min   Anion gap 10 5 - 15    Comment: Performed at [facility], [address] [place], [place] [address]  CBC with Differential     Status: Abnormal   Collection Time: 03/29/20  9:26 AM  Result Value Ref Range   WBC 11.6 (H) 4.0 - 10.5 K/uL   RBC 3.97 3.87 - 5.11 MIL/uL   Hemoglobin 12.7 12.0 - 15.0 g/dL   HCT 37.5 36 - 46 %   MCV 94.5 80.0 - 100.0 fL   MCH 32.0 26.0 - 34.0 pg   MCHC 33.9 30.0 - 36.0 g/dL   RDW 13.1 11.5 - 15.5 %    Platelets 216 150 - 400 K/uL   nRBC 0.0 0.0 - 0.2 %   Neutrophils Relative % 85 %   Neutro Abs 9.8 (H) 1.7 - 7.7 K/uL   Lymphocytes Relative 9 %   Lymphs Abs 1.1 0.7 - 4.0 K/uL   Monocytes Relative 5 %   Monocytes Absolute 0.6 0 - 1 K/uL   Eosinophils Relative 1 %   Eosinophils Absolute 0.1 0 - 0 K/uL   Basophils Relative 0 %   Basophils Absolute 0.0 0 - 0 K/uL   Immature Granulocytes 0 %   Abs Immature Granulocytes 0.05 0.00 - 0.07 K/uL    Comment: Performed at [facility], [place] [place], [place] [address]  Urinalysis, Complete w Microscopic     Status: Abnormal   Collection Time: 03/29/20  9:26 AM  Result Value Ref Range   Color, Urine YELLOW (A) YELLOW   APPearance TURBID (A) CLEAR   Specific Gravity, Urine 1.018 1.005 - 1.030   pH 5.0 5.0 - 8.0   Glucose, UA NEGATIVE NEGATIVE mg/dL   Hgb urine dipstick LARGE (A) NEGATIVE   Bilirubin Urine NEGATIVE NEGATIVE   Ketones, ur NEGATIVE NEGATIVE mg/dL   Protein, ur 100 (A) NEGATIVE mg/dL   Nitrite NEGATIVE NEGATIVE   Leukocytes,Ua LARGE (A) NEGATIVE   RBC / HPF >50 (H) 0 - 5 RBC/hpf   WBC, UA >50 (H) 0 - 5 WBC/hpf   Bacteria, UA FEW (A) NONE SEEN   Squamous Epithelial / LPF 0-5 0 - 5   Non Squamous Epithelial PRESENT (A) NONE SEEN    Comment: Performed at  [facility], [address] [place], [place] [address]  Urine culture     Status: Abnormal   Collection Time: 03/29/20  9:26 AM   Specimen: Urine, Random  Result Value Ref Range   Specimen Description      URINE, RANDOM Performed at [facility], [address], [place], [place] [address]    Special Requests      NONE Performed at [facility], [address] [place], [place] [address]    Culture MULTIPLE SPECIES PRESENT, SUGGEST RECOLLECTION (A)    Report Status 03/30/2020 FINAL   Lactic acid, plasma     Status: None   Collection Time: 03/29/20  1:39 PM  Result Value Ref Range   Lactic Acid, Venous 1.0 0.5 - 1.9  mmol/L    Comment: Performed at [facility], [address] [place], [place] [address]  CULTURE, BLOOD (ROUTINE X 2) w Reflex to ID Panel     Status: None (Preliminary result)   Collection Time: 03/29/20  2:21 PM   Specimen: BLOOD  Result Value Ref Range   Specimen Description BLOOD BLOOD RIGHT FOREARM    Special Requests      BOTTLES DRAWN AEROBIC AND ANAEROBIC Blood Culture adequate volume   Culture      NO GROWTH 4 DAYS Performed at [facility], [address] [place], [place] [address]    Report Status PENDING   CULTURE, BLOOD (ROUTINE X 2) w Reflex to ID Panel     Status: None (Preliminary result)   Collection Time: 03/29/20  2:21 PM   Specimen: BLOOD  Result Value Ref Range   Specimen Description BLOOD RIGHT ANTECUBITAL    Special Requests      BOTTLES DRAWN AEROBIC AND ANAEROBIC Blood Culture adequate volume   Culture      NO GROWTH 4 DAYS Performed at [facility], [address] [place], [place] [address]    Report Status PENDING   Brain natriuretic peptide     Status: Abnormal   Collection Time: 03/29/20  2:22 PM  Result Value Ref Range   B Natriuretic Peptide 169.2 (H) 0.0 - 100.0 pg/mL    Comment: Performed at [facility], [address] [place],  [address]  SARS Coronavirus 2 by RT PCR (hospital order, performed in [place] hospital lab) Nasopharyngeal Nasopharyngeal Swab     Status: None   Collection Time: 03/29/20  2:25 PM   Specimen: Nasopharyngeal Swab  Result Value Ref Range   SARS Coronavirus 2 NEGATIVE NEGATIVE    Comment: (NOTE) SARS-CoV-2 target nucleic acids are NOT DETECTED. The SARS-CoV-2 RNA is generally detectable in upper and lower respiratory specimens during the acute phase of infection. The lowest concentration of SARS-CoV-2 viral copies this assay can detect is 250 copies / mL. A negative result does not preclude SARS-CoV-2 infection and should not be used as the sole basis for treatment  or other patient management decisions.  A negative result may occur with improper specimen collection / handling, submission of specimen other than nasopharyngeal swab, presence of viral mutation(s) within the areas targeted by this assay, and inadequate number of viral copies (<250 copies / mL). A negative result must be combined with clinical observations, patient history, and epidemiological information. Fact Sheet for Patients:   StrictlyIdeas.no Fact Sheet for Healthcare Providers: BankingDealers.co.za This test is not yet approved or cleared  by the [place] FDA and has been authorized for detection and/or diagnosis of SARS-CoV-2 by  FDA under an Emergency Use Authorization (EUA).  This EUA will remain in effect (meaning this test can be used) for the duration of the COVID-19 declaration under Section 564(b)(1) of the Act, 21 U.S.C. section 360bbb-3(b)(1), unless the authorization is terminated or revoked sooner. Performed at [facility], [address] [place], [place] [address]   Protime-INR     Status: Abnormal   Collection Time: 03/29/20  3:24 PM  Result Value Ref Range   Prothrombin Time 15.4 (H) 11.4 - 15.2 seconds   INR 1.3 (H) 0.8 - 1.2    Comment: (NOTE) INR goal varies based on device and disease states. Performed at [facility], [address] [place], [place] [address]   Hemoglobin A1c     Status: None   Collection Time: 03/30/20  4:12 AM  Result Value Ref Range   Hgb A1c MFr Bld 5.0 4.8 - 5.6 %    Comment: (NOTE) Pre diabetes:          5.7%-6.4% Diabetes:              >6.4% Glycemic control for   <7.0% adults with diabetes    Mean Plasma Glucose 96.8 mg/dL    Comment: Performed at [facility] [address] [place], [place] [address]  Basic metabolic panel     Status: Abnormal   Collection Time: 03/30/20  4:12 AM  Result Value Ref Range   Sodium 139 135 - 145 mmol/L    Potassium 3.8 3.5 - 5.1 mmol/L   Chloride 107 98 - 111 mmol/L   CO2 26 22 - 32 mmol/L   Glucose, Bld 112 (H) 70 - 99 mg/dL    Comment: Glucose reference range applies only to samples taken after fasting for at least 8 hours.   BUN 12 8 - 23 mg/dL   Creatinine, Ser 0.79 0.44 - 1.00 mg/dL   Calcium 8.8 (L) 8.9 - 10.3 mg/dL   GFR calc non Af Amer >60 >60 mL/min   GFR calc Af Amer >60 >60 mL/min   Anion gap 6 5 - 15    Comment: Performed at [facility], [address] [place], [place] [address]  CBC     Status: Abnormal   Collection Time: 03/30/20  4:12 AM  Result Value Ref Range   WBC 7.1 4.0 - 10.5 K/uL   RBC 3.57 (L) 3.87 - 5.11 MIL/uL   Hemoglobin 11.5 (L) 12.0 - 15.0 g/dL   HCT 33.9 (L) 36 - 46 %   MCV 95.0 80.0 - 100.0 fL   MCH 32.2 26.0 - 34.0 pg   MCHC 33.9 30.0 - 36.0 g/dL   RDW 13.2 11.5 - 15.5 %   Platelets 200 150 - 400 K/uL   nRBC 0.0 0.0 - 0.2 %    Comment: Performed at [facility], [address] [place], [place] [address]  Creatinine, serum     Status: None   Collection Time: 03/31/20  4:49 AM  Result Value Ref Range   Creatinine, Ser 0.77 0.44 - 1.00 mg/dL   GFR calc non Af Amer >60 >60 mL/min   GFR calc Af Amer >60 >60 mL/min    Comment: Performed at [facility], [address] [place], [place] [address]      PHQ2/9: Depression screen [facility] 2/9 04/02/2020 02/02/2020 12/03/2019 06/02/2019 05/23/2019  Decreased Interest 0 0 0 0 0  Down, Depressed, Hopeless 0 0 0 0 0  PHQ - 2 Score  0 0 0 0 0  Altered sleeping 0 0 0 0 -  Tired, decreased energy 0 0 0 0 -  Change in appetite 0 0 0 0 -  Feeling bad or failure about yourself  0 0 0 0 -  Trouble concentrating 0 0 0 0 -  Moving slowly or fidgety/restless 0 0 0 0 -  Suicidal thoughts 0 0 0 0 -  PHQ-9 Score 0 0 0 0 -  Difficult doing work/chores - Not difficult at all - - -  Some recent data might be hidden    phq 9 is negative   Fall Risk: Fall Risk  04/02/2020 02/02/2020  12/03/2019 06/02/2019 05/23/2019  Falls in the past year? 0 0 0 0 0  Number falls in past yr: 0 0 0 0 0  Injury with Fall? 0 0 0 0 0  Comment - - - - -  Risk for fall due to : - - - - Impaired vision  Risk for fall due to: Comment - - - - -  Follow up - - - - Falls prevention discussed    Functional Status Survey: Is the patient deaf or have difficulty hearing?: No Does the patient have difficulty seeing, even when wearing glasses/contacts?: No Does the patient have difficulty concentrating, remembering, or making decisions?: No Does the patient have difficulty walking or climbing stairs?: No Does the patient have difficulty dressing or bathing?: No Does the patient have difficulty doing errands alone such as visiting a doctor's office or shopping?: No    Assessment & Plan  1. Hospital discharge follow-up  She is going to keep follow up with Urologist   2. Dyslipidemia  On statin therapy   3. Pulmonary hypertension ([place])  On echo done 2019   4. Stage 3a chronic kidney disease  It improved with hospital stay, but it was 44 upon arrival   5. Senile purpura ([place])  Worse since multiple IV's during recent hospital stay   6. GAD (generalized anxiety disorder)  Doing well   7. SVT (supraventricular tachycardia) (HCC)  She has intermittent palpitation   8. Chronic a-fib (HCC)  Rate controlled, on medications  9. Essential hypertension  At goal   10. Hyperglycemia  Normal A1C during hospital stay   11. Chronic combined systolic (congestive) and diastolic (congestive) heart failure (HCC)  Doing well now

## 2020-04-03 LAB — CULTURE, BLOOD (ROUTINE X 2)
Culture: NO GROWTH
Culture: NO GROWTH
Special Requests: ADEQUATE
Special Requests: ADEQUATE

## 2020-04-21 ENCOUNTER — Other Ambulatory Visit: Payer: Self-pay

## 2020-04-21 ENCOUNTER — Encounter: Payer: Self-pay | Admitting: Urology

## 2020-04-21 ENCOUNTER — Ambulatory Visit: Payer: Medicare Other | Admitting: Urology

## 2020-04-21 VITALS — BP 129/61 | HR 80 | Ht 66.0 in | Wt 129.0 lb

## 2020-04-21 DIAGNOSIS — N390 Urinary tract infection, site not specified: Secondary | ICD-10-CM

## 2020-04-21 DIAGNOSIS — N1330 Unspecified hydronephrosis: Secondary | ICD-10-CM | POA: Diagnosis not present

## 2020-04-21 DIAGNOSIS — R300 Dysuria: Secondary | ICD-10-CM | POA: Diagnosis not present

## 2020-04-21 LAB — BLADDER SCAN AMB NON-IMAGING

## 2020-04-21 NOTE — Progress Notes (Signed)
   04/21/2020 9:33 AM   Levander Campion Blanch Media [birth year] [id number]  Reason for visit: Follow up right hydronephrosis, UTIs  HPI: I saw [name] today in urology clinic for hospital follow-up.  To briefly summarize, she is a 79 year old female with congestive heart failureand atrial fibrillation on Eliquis who had 2 episodes of UTI/possible pyelonephritis in May and June 2021.  A non-contrast CT performed at her original episode showed mild right hydroureteronephrosis with no stone or other etiology seen, urine culture from first episode grew Pseudomonas and staph epidermidis.  She had hematuria at the first episode that resolved with antibiotics.  She then improved with antibiotics, however then recurred in early June with with similar urinary symptoms of urgency, frequency, and dysuria.  Renal ultrasound at that visit showed mild right hydronephrosis, and culture showed mixed species.  She improved with antibiotics and was discharged.  Renal function was slightly impaired at that hospitalization with a creatinine of 1.08, EGFR 49, however improved in follow-up after treatment with antibiotics to 0.77, EGFR greater than 60.  She reports she has been doing well since discharge and denies any significant urinary symptoms.  She is had no further hematuria.  She has some mild right-sided flank discomfort, but she thinks this may be related to her prior back surgery.  Her right-sided discomfort and pain is worse with physical activity and standing, and improves with Tylenol.  She has never smoked and denies any other carcinogenic risk factors.  With a long conversation about her interesting presentation and CT and ultrasound findings of mild right hydronephrosis of unclear duration.  We discussed the possibility of the relation of her right-sided flank discomfort and correlation with the CT findings of right-sided hydronephrosis, however her worsening pain with physical activity would go against this and be more  related to musculoskeletal pain.  She is hesitant to undergo further evaluation or work-up with her other comorbidities and the fact that she is feeling well now.  She is unable to void for urinalysis today and PVR 7 mL.  She is leaving for the beach this afternoon for the long holiday weekend.  I recommended repeating a urinalysis and culture early next week.  If urinalysis shows any microscopic hematuria or recurrent infection, I recommended cystoscopy and bilateral retrograde pyelograms with possible diagnostic ureteroscopy to evaluate etiology of her CT and ultrasound findings.  We discussed possibilities including stricture or malignancy.  If urinalysis is completely benign and she is feeling well, I recommended a repeat renal ultrasound in 4 to 6 weeks to confirm resolution of prior hydronephrosis.  We also discussed cranberry tablet prophylaxis for UTIs in the setting of her 2 recent UTIs over the last 6 weeks.  We discussed return precautions at length including gross hematuria, UTIs, or worsening right-sided flank pain.  Urinalysis and culture early next week, call with results and pursue either diagnostic ureteroscopy and retrograde pyelograms or renal ultrasound as discussed above  I spent 30 total minutes on the day of the encounter including pre-visit review of the medical record, face-to-face time with the patient, and post visit ordering of labs/imaging/tests.  Billey Co, [facility] [address], [place], [place] [address] [phone]

## 2020-04-22 ENCOUNTER — Other Ambulatory Visit: Payer: Self-pay | Admitting: Cardiovascular Disease

## 2020-04-26 ENCOUNTER — Other Ambulatory Visit: Payer: Self-pay | Admitting: Cardiovascular Disease

## 2020-04-27 ENCOUNTER — Other Ambulatory Visit: Payer: Medicare Other

## 2020-04-27 ENCOUNTER — Other Ambulatory Visit: Payer: Self-pay

## 2020-04-27 DIAGNOSIS — R300 Dysuria: Secondary | ICD-10-CM | POA: Diagnosis not present

## 2020-04-28 ENCOUNTER — Other Ambulatory Visit: Payer: Self-pay | Admitting: Urology

## 2020-04-28 DIAGNOSIS — N1339 Other hydronephrosis: Secondary | ICD-10-CM

## 2020-04-28 LAB — MICROSCOPIC EXAMINATION: Bacteria, UA: NONE SEEN

## 2020-04-28 LAB — URINALYSIS, COMPLETE
Bilirubin, UA: NEGATIVE
Glucose, UA: NEGATIVE
Leukocytes,UA: NEGATIVE
Nitrite, UA: NEGATIVE
Protein,UA: NEGATIVE
RBC, UA: NEGATIVE
Specific Gravity, UA: 1.015 (ref 1.005–1.030)
Urobilinogen, Ur: 1 mg/dL (ref 0.2–1.0)
pH, UA: 5.5 (ref 5.0–7.5)

## 2020-04-28 NOTE — Progress Notes (Signed)
re

## 2020-04-28 NOTE — Telephone Encounter (Signed)
Please advise if ok to refill Potassium 10 meq tablet last filled Historical provider.

## 2020-04-29 ENCOUNTER — Telehealth: Payer: Self-pay

## 2020-04-29 LAB — CULTURE, URINE COMPREHENSIVE

## 2020-04-29 NOTE — Telephone Encounter (Signed)
Called pt informed her of the information below. Pt gave verbal understanding. Appt scheduled.  

## 2020-04-29 NOTE — Telephone Encounter (Signed)
-----   Message from Billey Co, MD sent at 04/28/2020 10:29 AM EDT ----- [name] news, urinalysis is completely normal with no blood cells or infection.  Please schedule follow-up in 6 weeks with renal ultrasound prior, I will order the renal ultrasound now.  She should continue cranberry tablets for UTI prevention, and call us if she has any problems or recurrent infections.  Nickolas Madrid, MD 04/28/2020

## 2020-05-03 ENCOUNTER — Other Ambulatory Visit: Payer: Self-pay | Admitting: Family Medicine

## 2020-05-03 DIAGNOSIS — F5102 Adjustment insomnia: Secondary | ICD-10-CM

## 2020-05-03 DIAGNOSIS — F411 Generalized anxiety disorder: Secondary | ICD-10-CM

## 2020-05-03 NOTE — Telephone Encounter (Signed)
Requested medication (s) are due for refill today: yes  Requested medication (s) are on the active medication list: yes  Last refill:  04/06/2020  Future visit scheduled: yes  Notes to clinic:  this refill cannot be delegated    Requested Prescriptions  Pending Prescriptions Disp Refills   temazepam (RESTORIL) 15 MG capsule [Pharmacy Med Name: TEMAZEPAM 15MG  CAPSULES] 30 capsule     Sig: TAKE 1 CAPSULE(15 MG) BY MOUTH AT BEDTIME AS NEEDED FOR SLEEP      Not Delegated - Psychiatry:  Anxiolytics/Hypnotics Failed - 05/03/2020  9:16 AM      Failed - This refill cannot be delegated      Failed - Urine Drug Screen completed in last 360 days.      Passed - Valid encounter within last 6 months    Recent Outpatient Visits           1 month ago Hospital discharge follow-up   [facility] Steele Sizer, MD   3 months ago [facility] Steele Sizer, MD   5 months ago Dyslipidemia   [facility] Steele Sizer, MD   11 months ago Dyslipidemia   [facility] Steele Sizer, MD   1 year ago Essential hypertension   [facility] Steele Sizer, MD       Future Appointments             In 3 weeks  [facility], [place]   In 3 months Steele Sizer, MD [facility], [facility]

## 2020-05-19 ENCOUNTER — Ambulatory Visit
Admission: RE | Admit: 2020-05-19 | Discharge: 2020-05-19 | Disposition: A | Discharge status: Home / Self Care | Payer: Medicare Other | Source: Ambulatory Visit | Attending: Urology | Admitting: Urology

## 2020-05-19 ENCOUNTER — Other Ambulatory Visit: Payer: Self-pay

## 2020-05-19 DIAGNOSIS — N1330 Unspecified hydronephrosis: Secondary | ICD-10-CM | POA: Diagnosis not present

## 2020-05-19 DIAGNOSIS — N1339 Other hydronephrosis: Secondary | ICD-10-CM | POA: Insufficient documentation

## 2020-05-19 DIAGNOSIS — N2889 Other specified disorders of kidney and ureter: Secondary | ICD-10-CM | POA: Diagnosis not present

## 2020-05-25 ENCOUNTER — Ambulatory Visit (INDEPENDENT_AMBULATORY_CARE_PROVIDER_SITE_OTHER): Payer: Medicare Other

## 2020-05-25 ENCOUNTER — Other Ambulatory Visit: Payer: Self-pay

## 2020-05-25 ENCOUNTER — Telehealth: Payer: Self-pay

## 2020-05-25 VITALS — BP 130/50 | HR 61 | Temp 96.9°F | Resp 16 | Ht 66.0 in | Wt 128.6 lb

## 2020-05-25 DIAGNOSIS — Z79899 Other long term (current) drug therapy: Secondary | ICD-10-CM | POA: Diagnosis not present

## 2020-05-25 DIAGNOSIS — Z0000 Encounter for general adult medical examination without abnormal findings: Secondary | ICD-10-CM

## 2020-05-25 NOTE — Patient Instructions (Signed)
[name] , Thank you for taking time to come for your Medicare Wellness Visit. I appreciate your ongoing commitment to your health goals. Please review the following plan we discussed and let me know if I can assist you in the future.   Screening recommendations/referrals: Colonoscopy: no longer required Mammogram: no longer required Bone Density: no longer required Recommended yearly ophthalmology/optometry visit for glaucoma screening and checkup Recommended yearly dental visit for hygiene and checkup  Vaccinations: Influenza vaccine: done 08/11/19 Pneumococcal vaccine: done 12/04/17 Tdap vaccine: due Shingles vaccine: Shingrix discussed. Please contact your pharmacy for coverage information.  Covid-19:done 11/13/19 & 12/04/19  Advanced directives: Please bring a copy of your health care power of attorney and living will to the office at your convenience.  Conditions/risks identified: Keep up the great work!  Next appointment: Follow up in one year for your annual wellness visit    Preventive Care 65 Years and Older, Female Preventive care refers to lifestyle choices and visits with your health care provider that can promote health and wellness. What does preventive care include?  A yearly physical exam. This is also called an annual well check.  Dental exams once or twice a year.  Routine eye exams. Ask your health care provider how often you should have your eyes checked.  Personal lifestyle choices, including:  Daily care of your teeth and gums.  Regular physical activity.  Eating a healthy diet.  Avoiding tobacco and drug use.  Limiting alcohol use.  Practicing safe sex.  Taking low-dose aspirin every day.  Taking vitamin and mineral supplements as recommended by your health care provider. What happens during an annual well check? The services and screenings done by your health care provider during your annual well check will depend on your age, overall health,  lifestyle risk factors, and family history of disease. Counseling  Your health care provider may ask you questions about your:  Alcohol use.  Tobacco use.  Drug use.  Emotional well-being.  Home and relationship well-being.  Sexual activity.  Eating habits.  History of falls.  Memory and ability to understand (cognition).  Work and work Statistician.  Reproductive health. Screening  You may have the following tests or measurements:  Height, weight, and BMI.  Blood pressure.  Lipid and cholesterol levels. These may be checked every 5 years, or more frequently if you are over 31 years old.  Skin check.  Lung cancer screening. You may have this screening every year starting at age 5 if you have a 30-pack-year history of smoking and currently smoke or have quit within the past 15 years.  Fecal occult blood test (FOBT) of the stool. You may have this test every year starting at age 32.  Flexible sigmoidoscopy or colonoscopy. You may have a sigmoidoscopy every 5 years or a colonoscopy every 10 years starting at age 6.  Hepatitis C blood test.  Hepatitis B blood test.  Sexually transmitted disease (STD) testing.  Diabetes screening. This is done by checking your blood sugar (glucose) after you have not eaten for a while (fasting). You may have this done every 1-3 years.  Bone density scan. This is done to screen for osteoporosis. You may have this done starting at age 26.  Mammogram. This may be done every 1-2 years. Talk to your health care provider about how often you should have regular mammograms. Talk with your health care provider about your test results, treatment options, and if necessary, the need for more tests. Vaccines  Your  health care provider may recommend certain vaccines, such as:  Influenza vaccine. This is recommended every year.  Tetanus, diphtheria, and acellular pertussis (Tdap, Td) vaccine. You may need a Td booster every 10 years.  Zoster  vaccine. You may need this after age 45.  Pneumococcal 13-valent conjugate (PCV13) vaccine. One dose is recommended after age 23.  Pneumococcal polysaccharide (PPSV23) vaccine. One dose is recommended after age 37. Talk to your health care provider about which screenings and vaccines you need and how often you need them. This information is not intended to replace advice given to you by your health care provider. Make sure you discuss any questions you have with your health care provider. Document Released: 11/05/2015 Document Revised: 06/28/2016 Document Reviewed: 08/10/2015 Elsevier Interactive Patient Education  2017 [place] Prevention in the Home Falls can cause injuries. They can happen to people of all ages. There are many things you can do to make your home safe and to help prevent falls. What can I do on the outside of my home?  Regularly fix the edges of walkways and driveways and fix any cracks.  Remove anything that might make you trip as you walk through a door, such as a raised step or threshold.  Trim any bushes or trees on the path to your home.  Use bright outdoor lighting.  Clear any walking paths of anything that might make someone trip, such as rocks or tools.  Regularly check to see if handrails are loose or broken. Make sure that both sides of any steps have handrails.  Any raised decks and porches should have guardrails on the edges.  Have any leaves, snow, or ice cleared regularly.  Use sand or salt on walking paths during winter.  Clean up any spills in your garage right away. This includes oil or grease spills. What can I do in the bathroom?  Use night lights.  Install grab bars by the toilet and in the tub and shower. Do not use towel bars as grab bars.  Use non-skid mats or decals in the tub or shower.  If you need to sit down in the shower, use a plastic, non-slip stool.  Keep the floor dry. Clean up any water that spills on the  floor as soon as it happens.  Remove soap buildup in the tub or shower regularly.  Attach bath mats securely with double-sided non-slip rug tape.  Do not have throw rugs and other things on the floor that can make you trip. What can I do in the bedroom?  Use night lights.  Make sure that you have a light by your bed that is easy to reach.  Do not use any sheets or blankets that are too big for your bed. They should not hang down onto the floor.  Have a firm chair that has side arms. You can use this for support while you get dressed.  Do not have throw rugs and other things on the floor that can make you trip. What can I do in the kitchen?  Clean up any spills right away.  Avoid walking on wet floors.  Keep items that you use a lot in easy-to-reach places.  If you need to reach something above you, use a strong step stool that has a grab bar.  Keep electrical cords out of the way.  Do not use floor polish or wax that makes floors slippery. If you must use wax, use non-skid floor wax.  Do not  have throw rugs and other things on the floor that can make you trip. What can I do with my stairs?  Do not leave any items on the stairs.  Make sure that there are handrails on both sides of the stairs and use them. Fix handrails that are broken or loose. Make sure that handrails are as long as the stairways.  Check any carpeting to make sure that it is firmly attached to the stairs. Fix any carpet that is loose or worn.  Avoid having throw rugs at the top or bottom of the stairs. If you do have throw rugs, attach them to the floor with carpet tape.  Make sure that you have a light switch at the top of the stairs and the bottom of the stairs. If you do not have them, ask someone to add them for you. What else can I do to help prevent falls?  Wear shoes that:  Do not have high heels.  Have rubber bottoms.  Are comfortable and fit you well.  Are closed at the toe. Do not wear  sandals.  If you use a stepladder:  Make sure that it is fully opened. Do not climb a closed stepladder.  Make sure that both sides of the stepladder are locked into place.  Ask someone to hold it for you, if possible.  Clearly mark and make sure that you can see:  Any grab bars or handrails.  First and last steps.  Where the edge of each step is.  Use tools that help you move around (mobility aids) if they are needed. These include:  Canes.  Walkers.  Scooters.  Crutches.  Turn on the lights when you go into a dark area. Replace any light bulbs as soon as they burn out.  Set up your furniture so you have a clear path. Avoid moving your furniture around.  If any of your floors are uneven, fix them.  If there are any pets around you, be aware of where they are.  Review your medicines with your doctor. Some medicines can make you feel dizzy. This can increase your chance of falling. Ask your doctor what other things that you can do to help prevent falls. This information is not intended to replace advice given to you by your health care provider. Make sure you discuss any questions you have with your health care provider. Document Released: 08/05/2009 Document Revised: 03/16/2016 Document Reviewed: 11/13/2014 Elsevier Interactive Patient Education  2017 [employer].

## 2020-05-25 NOTE — Progress Notes (Signed)
Subjective:   [name] is a 79 y.o. female who presents for Medicare Annual (Subsequent) preventive examination.  Review of Systems     Cardiac Risk Factors include: advanced age (>55men, >37 women);hypertension;dyslipidemia     Objective:    Today's Vitals   05/25/20 0955  BP: (!) 130/50  Pulse: 61  Resp: 16  Temp: (!) 96.9 F (36.1 C)  TempSrc: Temporal  SpO2: 99%  Weight: 128 lb 9.6 oz (58.3 kg)  Height: 5\' 6"  (1.676 m)  PainSc: 5    Body mass index is 20.76 kg/m.  Advanced Directives 05/25/2020 03/29/2020 03/29/2020 03/11/2020 07/01/2019 05/23/2019 06/26/2018  Does Patient Have a Medical Advance Directive? Yes Yes No No Yes Yes Yes  Type of Paramedic of [place];Living will [place];Living will - - - [place];Living will Out of facility DNR (pink MOST or yellow form)  Does patient want to make changes to medical advance directive? - No - Patient declined - - - No - Patient declined No - Patient declined  Copy of [place] in Chart? No - copy requested No - copy requested - - - No - copy requested -  Would patient like information on creating a medical advance directive? - No - Patient declined No - Patient declined - - - -  Pre-existing out of facility DNR order (yellow form or pink MOST form) - - - - - - Physician notified to receive inpatient order    Current Medications (verified) Outpatient Encounter Medications as of 05/25/2020  Medication Sig  . acetaminophen (TYLENOL) 650 MG CR tablet Take 650-1,300 mg by mouth every 8 (eight) hours as needed for pain.   Marland Kitchen amiodarone (PACERONE) 200 MG tablet TAKE 1 TABLET(200 MG) BY MOUTH DAILY (Patient taking differently: Take 200 mg by mouth daily. )  . apixaban (ELIQUIS) 5 MG TABS tablet Take 1 tablet (5 mg total) by mouth 2 (two) times daily.  . busPIRone (BUSPAR) 5 MG tablet TAKE 1 TABLET(5 MG) BY MOUTH TWICE DAILY  . cholecalciferol (VITAMIN D)  1000 UNITS tablet Take 2,000 Units by mouth daily.  Marland Kitchen doxazosin (CARDURA) 1 MG tablet TAKE 1 TABLET(1 MG) BY MOUTH TWICE DAILY  . furosemide (LASIX) 20 MG tablet Take 20 mg by mouth See admin instructions. Take 1 tablet (20mg ) by mouth every morning - take 1 additional tablet (20mg ) after lunch if needed for swelling  . levothyroxine (SYNTHROID) 137 MCG tablet TAKE 1 TABLET(137 MCG) BY MOUTH EVERY MORNING (Patient taking differently: Take 137 mcg by mouth daily before breakfast. )  . metoprolol succinate (TOPROL-XL) 50 MG 24 hr tablet Take with or immediately following a meal. (Patient taking differently: Take 50 mg by mouth daily. )  . Multiple Vitamin (MULITIVITAMIN WITH MINERALS) TABS Take 1 tablet by mouth daily.  . potassium chloride (KLOR-CON) 10 MEQ tablet TAKE 1 TABLET BY MOUTH EVERY DAY AS DIRECTED. MAY TAKE AN EXTRA PILL AFTER LUNCH WITH FLUID PILL AS NEEDED FOR SWELLING  . rosuvastatin (CRESTOR) 40 MG tablet TAKE 1 TABLET(40 MG) BY MOUTH DAILY (Patient taking differently: Take 40 mg by mouth every evening. )  . temazepam (RESTORIL) 15 MG capsule TAKE 1 CAPSULE(15 MG) BY MOUTH AT BEDTIME AS NEEDED FOR SLEEP  . valsartan (DIOVAN) 320 MG tablet TAKE 1 TABLET(320 MG) BY MOUTH DAILY (Patient taking differently: Take 320 mg by mouth daily. )  . amLODipine (NORVASC) 10 MG tablet Take 1 tablet (10 mg total) by  mouth daily.  . [DISCONTINUED] potassium chloride (KLOR-CON) 10 MEQ tablet Take 10 mEq by mouth See admin instructions. Take 1 tablet (41meq) by mouth every morning - take 1 additional tablet (79meq) after lunch if taking furosemide   No facility-administered encounter medications on file as of 05/25/2020.    Allergies (verified) Penicillins, Codeine, Erythromycin, and Septra [sulfamethoxazole-trimethoprim]   History: Past Medical History:  Diagnosis Date  . Anxiety   . Bronchitis   . Chronic combined systolic (congestive) and diastolic (congestive) heart failure ([place])    a. 02/2018  Echo: EF 45-50%, diff HK, triv AI, mod MR, mildly dil LA/RA, nl RV fxn, mod TR. PASP 40-27mmHg.  Marland Kitchen Chronic kidney disease   . DJD (degenerative joint disease), cervical   . History of stress test    a. 01/2008 MV: EF 76%. Fair ex tol. No ischemia.  . Hyperlipidemia   . Hypertension   . Hypothyroid   . LBBB (left bundle branch block)   . Leg pain    a. 01/2018 ABI's wnl.  . Lumbar spondylosis   . Persistent atrial fibrillation ([place])    a. 01/2018 Event monitor: 45 runs of SVT, longest 14.5 sec. SVT felt to be Afib/flutter-->2% burden. Longest run of AF 4h 73m (CHA2DS2VASc = 5-->Xarelto); b. 03/2019 s/p DCCV; c. 04/2019 Repeat Amio load and DCCV.  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 BACK SURGERY  07/2019  . CARDIOVERSION N/A 03/26/2018   Procedure: CARDIOVERSION;  Surgeon: Nelva Bush, MD;  Location: [facility] ORS;  Service: Cardiovascular;  Laterality: N/A;  . CARDIOVERSION N/A 04/24/2018   Procedure: CARDIOVERSION;  Surgeon: Minna Merritts, MD;  Location: [facility] ORS;  Service: Cardiovascular;  Laterality: N/A;  . ELECTROPHYSIOLOGY STUDY N/A 02/27/2012   Procedure: ELECTROPHYSIOLOGY STUDY;  Surgeon: Evans Lance, MD;  Location: [facility] CATH LAB;  Service: Cardiovascular;  Laterality: N/A;  . EPS and ablation for SVT  5/13   slow pathway ablation by Dr Lovena Le  . EYE SURGERY     surgery for detatched retina  . NECK SURGERY  02/2020  . SUPRAVENTRICULAR TACHYCARDIA ABLATION N/A 02/27/2012   Procedure: SUPRAVENTRICULAR TACHYCARDIA ABLATION;  Surgeon: Evans Lance, MD;  Location: [facility] CATH LAB;  Service: Cardiovascular;  Laterality: N/A;   Family History  Problem Relation Age of Onset  . Alzheimer's disease Mother   . Stroke Father   . Breast cancer Father   . Heart disease Father        Pacemaker  . Breast cancer Paternal Aunt   . Kidney cancer Maternal Grandmother   . Alzheimer's disease  Maternal Grandfather   . Thyroid disease Paternal Grandmother   . Stroke Paternal Grandfather    Social History   Socioeconomic History  . Marital status: Widowed    Spouse name: Not on file  . Number of children: 1  . Years of education: Not on file  . Highest education level: 12th grade  Occupational History  . Occupation: Retired  Tobacco Use  . Smoking status: Never Smoker  . Smokeless tobacco: Never Used  . Tobacco comment: smoking cessation materials not required  Vaping Use  . Vaping Use: Never used  Substance and Sexual Activity  . Alcohol use: No  . Drug use: No  . Sexual activity: Not Currently    Partners: Male    Birth control/protection: Post-menopausal  Other Topics Concern  . Not  on file  Social History Narrative   Lives in [place], she has a boyfriend   She has cats, some inside, some outside    Social Determinants of Health   Financial Resource Strain: Low Risk   . Difficulty of Paying Living Expenses: Not hard at all  Food Insecurity: No Food Insecurity  . Worried About Charity fundraiser in the Last Year: Never true  . Ran Out of Food in the Last Year: Never true  Transportation Needs: No Transportation Needs  . Lack of Transportation (Medical): No  . Lack of Transportation (Non-Medical): No  Physical Activity: Sufficiently Active  . Days of Exercise per Week: 6 days  . Minutes of Exercise per Session: 30 min  Stress: No Stress Concern Present  . Feeling of Stress : Not at all  Social Connections: Moderately Integrated  . Frequency of Communication with Friends and Family: More than three times a week  . Frequency of Social Gatherings with Friends and Family: More than three times a week  . Attends Religious Services: More than 4 times per year  . Active Member of Clubs or Organizations: Yes  . Attends Archivist Meetings: More than 4 times per year  . Marital Status: Widowed    Tobacco Counseling Counseling given: Not  Answered Comment: smoking cessation materials not required   Clinical Intake:  Pre-visit preparation completed: Yes  Pain : 0-10 Pain Score: 5  Pain Type: Chronic pain Pain Location: Neck Pain Orientation: Posterior Pain Descriptors / Indicators: Aching, Dull, Sore Pain Onset: More than a month ago Pain Frequency: Constant     BMI - recorded: 20.76 Nutritional Status: BMI of 19-24  Normal Nutritional Risks: None Diabetes: No  How often do you need to have someone help you when you read instructions, pamphlets, or other written materials from your doctor or pharmacy?: 1 - Never    Interpreter Needed?: No  Information entered by :: Clemetine Marker LPN   Activities of Daily Living In your present state of health, do you have any difficulty performing the following activities: 05/25/2020 04/02/2020  Hearing? N N  Comment declines hearing aids -  Vision? N N  Difficulty concentrating or making decisions? N N  Walking or climbing stairs? N N  Dressing or bathing? N N  Doing errands, shopping? N N  Preparing Food and eating ? N -  Using the Toilet? N -  In the past six months, have you accidently leaked urine? N -  Do you have problems with loss of bowel control? N -  Managing your Medications? N -  Managing your Finances? N -  Housekeeping or managing your Housekeeping? N -  Some recent data might be hidden    Patient Care Team: Steele Sizer, MD as PCP - General (Family Medicine) Minna Merritts, MD as PCP - Cardiology (Cardiology) Dingeldein, Remo Lipps, MD as Consulting Physician (Ophthalmology) Alisa Graff, FNP as Consulting Physician (Family Medicine) Jannet Mantis, MD (Dermatology) Minna Merritts, MD as Consulting Physician (Cardiology) Ashok Pall, MD as Consulting Physician (Neurosurgery) Marlaine Hind, MD as Consulting Physician (Physical Medicine and Rehabilitation)  Indicate any recent Medical Services you may have received from other than  [facility] providers in the past year (date may be approximate).     Assessment:   This is a routine wellness examination for [name].  Hearing/Vision screen  Hearing Screening   125Hz  250Hz  500Hz  1000Hz  2000Hz  3000Hz  4000Hz  6000Hz  8000Hz   Right ear:  Left ear:           Comments: Pt denies hearing difficulty  Vision Screening Comments: Annual [facility] screenings [facility] Dr. Thomasene Ripple  Dietary issues and exercise activities discussed: Current Exercise Habits: Home exercise routine, Type of exercise: walking, Time (Minutes): 30, Frequency (Times/Week): 6, Weekly Exercise (Minutes/Week): 180, Intensity: Mild, Exercise limited by: None identified  Goals    . Exercise 150 min/wk Moderate Activity     Recommend to exercise for at least 150 minutes per week.    . Increase water intake     Recommend increasing water intake to 5-6 glasses a day.      Depression Screen PHQ 2/9 Scores 05/25/2020 04/02/2020 02/02/2020 12/03/2019 06/02/2019 05/23/2019 01/31/2019  PHQ - 2 Score 0 0 0 0 0 0 0  PHQ- 9 Score - 0 0 0 0 - 3    Fall Risk Fall Risk  05/25/2020 04/02/2020 02/02/2020 12/03/2019 06/02/2019  Falls in the past year? 0 0 0 0 0  Number falls in past yr: 0 0 0 0 0  Injury with Fall? 0 0 0 0 0  Comment - - - - -  Risk for fall due to : No Fall Risks - - - -  Risk for fall due to: Comment - - - - -  Follow up Falls prevention discussed - - - -    Any stairs in or around the home? Yes  If so, are there any without handrails? No  Home free of loose throw rugs in walkways, pet beds, electrical cords, etc? Yes  Adequate lighting in your home to reduce risk of falls? Yes   ASSISTIVE DEVICES UTILIZED TO PREVENT FALLS:  Life alert? No  Use of a cane, walker or w/c? No  Grab bars in the bathroom? Yes  Shower chair or bench in shower? No  Elevated toilet seat or a handicapped toilet? Yes   TIMED UP AND GO:  Was the test performed? Yes .  Length of time to ambulate 10 feet:  5 sec.   Gait steady and fast without use of assistive device  Cognitive Function:     6CIT Screen 05/25/2020 05/23/2019 05/17/2018 03/12/2017  What Year? 0 points 0 points 0 points 0 points  What month? 0 points 0 points 0 points 0 points  What time? 0 points 0 points 0 points 0 points  Count back from 20 0 points 0 points 0 points 0 points  Months in reverse 0 points 0 points 0 points 0 points  Repeat phrase 0 points 0 points 0 points 0 points  Total Score 0 0 0 0    Immunizations Immunization History  Administered Date(s) Administered  . Fluad Quad(high Dose 65+) 08/11/2019  . Influenza, High Dose Seasonal PF 07/22/2015, 07/04/2018  . Influenza-Unspecified 08/25/2016, 08/13/2017  . PFIZER SARS-COV-2 Vaccination 11/13/2019, 12/04/2019  . Pneumococcal Conjugate-13 09/07/2016  . Pneumococcal Polysaccharide-23 12/04/2017    TDAP status: Due, Education has been provided regarding the importance of this vaccine. Advised may receive this vaccine at local pharmacy or Health Dept. Aware to provide a copy of the vaccination record if obtained from local pharmacy or Health Dept. Verbalized acceptance and understanding.   Flu Vaccine status: Up to date   Pneumococcal vaccine status: Up to date   Covid-19 vaccine status: Completed vaccines  Qualifies for Shingles Vaccine? Yes   Zostavax completed No   Shingrix Completed?: No.    Education has been provided regarding the importance of this  vaccine. Patient has been advised to call insurance company to determine out of pocket expense if they have not yet received this vaccine. Advised may also receive vaccine at local pharmacy or Health Dept. Verbalized acceptance and understanding.  Screening Tests Health Maintenance  Topic Date Due  . Hepatitis C Screening  Never done  . INFLUENZA VACCINE  05/23/2020  . TETANUS/TDAP  12/02/2020 (Originally 10/23/2017)  . DEXA SCAN  Completed  . COVID-19 Vaccine  Completed  . PNA vac Low Risk Adult   Completed    Health Maintenance  Health Maintenance Due  Topic Date Due  . Hepatitis C Screening  Never done  . INFLUENZA VACCINE  05/23/2020    Colorectal cancer screening: No longer required.    Mammogram status: No longer required.    Bone Density status: Completed 06/12/17. Results reflect: Bone density results: NORMAL. Repeat every 2 years. Pt declines repeat screening at this time.   Lung Cancer Screening: (Low Dose CT Chest recommended if Age 57-80 years, 30 pack-year currently smoking OR have quit w/in 15years.) does not qualify.   Additional Screening:  Hepatitis C Screening: does qualify; postponed  Vision Screening: Recommended annual ophthalmology exams for early detection of glaucoma and other disorders of the eye. Is the patient up to date with their annual eye exam?  Yes  Who is the provider or what is the name of the office in which the patient attends annual eye exams? [facility] Screening: Recommended annual dental exams for proper oral hygiene  Community Resource Referral / Chronic Care Management: CRR required this visit?  No   CCM required this visit?  Yes - medication cost     Plan:     I have personally reviewed and noted the following in the patient's chart:   . Medical and social history . Use of alcohol, tobacco or illicit drugs  . Current medications and supplements . Functional ability and status . Nutritional status . Physical activity . Advanced directives . List of other physicians . Hospitalizations, surgeries, and ER visits in previous 12 months . Vitals . Screenings to include cognitive, depression, and falls . Referrals and appointments  In addition, I have reviewed and discussed with patient certain preventive protocols, quality metrics, and best practice recommendations. A written personalized care plan for preventive services as well as general preventive health recommendations were provided to patient.      Clemetine Marker, LPN   05/31/2799   Nurse Notes: pt doing well and appreciative of visit today. Referral sent to [facility] Pharmacist due to patient states she will need to reapply for patient assistance for cost of eliquis within the next month.

## 2020-05-25 NOTE — Chronic Care Management (AMB) (Signed)
  Chronic Care Management   Note  05/25/2020 Name: [name] MRN: [id number] DOB: [birth year]  [name] is a 78 y.o. year old female who is a primary care patient of Steele Sizer, MD. I reached out to [name] by phone today in response to a referral sent by Ms. Gerome Apley Shackett's PCP, Dr. Ancil Boozer     Ms. Emile was given information about Chronic Care Management services today including:  1. CCM service includes personalized support from designated clinical staff supervised by her physician, including individualized plan of care and coordination with other care providers 2. 24/7 contact phone numbers for assistance for urgent and routine care needs. 3. Service will only be billed when office clinical staff spend 20 minutes or more in a month to coordinate care. 4. Only one practitioner may furnish and bill the service in a calendar month. 5. The patient may stop CCM services at any time (effective at the end of the month) by phone call to the office staff. 6. The patient will be responsible for cost sharing (co-pay) of up to 20% of the service fee (after annual deductible is met).  Patient agreed to services and verbal consent obtained.   Follow up plan: Telephone appointment with care management team member scheduled for:06/16/2020  Noreene Larsson, [address], [place], [place] [address] Direct Dial: [phone] [email or web address]

## 2020-05-26 ENCOUNTER — Other Ambulatory Visit: Payer: Self-pay | Admitting: Family Medicine

## 2020-05-26 DIAGNOSIS — E039 Hypothyroidism, unspecified: Secondary | ICD-10-CM

## 2020-05-27 ENCOUNTER — Ambulatory Visit: Payer: Medicare Other | Admitting: Urology

## 2020-06-01 ENCOUNTER — Other Ambulatory Visit: Payer: Self-pay | Admitting: Family Medicine

## 2020-06-01 DIAGNOSIS — I482 Chronic atrial fibrillation, unspecified: Secondary | ICD-10-CM

## 2020-06-09 ENCOUNTER — Encounter: Payer: Self-pay | Admitting: Urology

## 2020-06-09 ENCOUNTER — Other Ambulatory Visit: Payer: Self-pay

## 2020-06-09 ENCOUNTER — Ambulatory Visit: Payer: Medicare Other | Admitting: Urology

## 2020-06-09 VITALS — BP 168/67 | HR 50 | Ht 66.0 in | Wt 129.0 lb

## 2020-06-09 DIAGNOSIS — N390 Urinary tract infection, site not specified: Secondary | ICD-10-CM | POA: Diagnosis not present

## 2020-06-09 DIAGNOSIS — N1339 Other hydronephrosis: Secondary | ICD-10-CM | POA: Diagnosis not present

## 2020-06-09 NOTE — Patient Instructions (Signed)
Please call [name] if you notice any blood in your urine or if you have recurrent urinary tract infections or flank pain.

## 2020-06-09 NOTE — Progress Notes (Signed)
° °  06/09/2020 9:19 AM   Levander Campion Blanch Media 12/09/1940 [id number]  Reason for visit: Follow up right hydronephrosis, recurrent UTIs  HPI: I saw [name] today in urology clinic for follow-up of right-sided hydronephrosis and recurrent UTIs.  To briefly summarize, she is a 79 year old female with congestive heart failure and atrial fibrillation on Eliquis who had 2 episodes of UTI/possible pyelonephritis in May and June 2021.  A non-contrast CT performed at her original episode showed mild right hydroureteronephrosis with no stone or other etiology seen, urine culture from first episode grew Pseudomonas and staph epidermidis.  She had hematuria at the first episode that resolved with antibiotics.  She then improved with antibiotics, however then recurred in early June with with similar urinary symptoms of urgency, frequency, and dysuria.  Renal ultrasound at that visit showed mild right hydronephrosis, and culture showed mixed species.  She improved with antibiotics and was discharged.  Renal function was slightly impaired at that hospitalization with a creatinine of 1.08, EGFR 49, however improved in follow-up after treatment with antibiotics to 0.77, EGFR greater than 60.  At our last visit, we discussed options including bilateral retrograde pyelograms and diagnostic right ureteroscopy and possible stent versus observation with a repeat ultrasound. With her comorbidities and the fact that she was feeling well she opted for observation. She denies any problems since we saw her last. She has occasional right-sided back pain that is worsened with physical activity, and resolves with rest that is likely musculoskeletal in nature. She denies any urinary symptoms, gross hematuria, or UTIs.  I personally reviewed the renal ultrasound dated 05/19/2020 and on my review shows normal appearance of the bladder, no significant hydronephrosis on either side, and mild fullness bilaterally of the left and right renal  pelvis, improved from prior.  We again discussed possible etiologies of her right-sided hydronephrosis and infection including stricture, scar tissue, UPJ obstruction, infection/inflammation, and malignancy. She would like to continue with observation which is very reasonable. I recommended repeating a renal ultrasound in 1 year to check stability. We again reviewed UTI prevention strategies, including cranberry tablets daily. We discussed return precautions extensively including worsening flank or groin pain, recurrent UTIs, or gross hematuria.  RTC 1 year with renal ultrasound prior  Billey Co, MD  [facility] [address] [place], [place] [address] [phone]

## 2020-06-16 ENCOUNTER — Ambulatory Visit: Payer: Medicare Other | Admitting: Pharmacist

## 2020-06-16 ENCOUNTER — Other Ambulatory Visit: Payer: Self-pay

## 2020-06-16 ENCOUNTER — Other Ambulatory Visit: Payer: Self-pay | Admitting: Family Medicine

## 2020-06-16 DIAGNOSIS — E785 Hyperlipidemia, unspecified: Secondary | ICD-10-CM

## 2020-06-16 DIAGNOSIS — I4821 Permanent atrial fibrillation: Secondary | ICD-10-CM

## 2020-06-16 NOTE — Chronic Care Management (AMB) (Signed)
Chronic Care Management Pharmacy  Name: [name]  MRN: [id number] DOB: [birth year]  Chief Complaint/ HPI  [name],  79 y.o. , female presents for their Initial CCM visit with the clinical pharmacist via telephone due to COVID-19 Pandemic.  PCP : Leslie Sizer, MD  Their chronic conditions include: HTN, HLD, Afib  Office Visits: 6/11 Hosp d/c, [name], BP 160/62 P 70, off ASA s/p ablation, cardioversion 2019  Consult Visit: 8/18 hydrohephrosis, [name], BP 168/67 P 50 Wt 129 BMI 20.8,   Medications: Outpatient Encounter Medications as of 06/16/2020  Medication Sig  . acetaminophen (TYLENOL) 650 MG CR tablet Take 650-1,300 mg by mouth every 8 (eight) hours as needed for pain.   Marland Kitchen amiodarone (PACERONE) 200 MG tablet TAKE 1 TABLET(200 MG) BY MOUTH DAILY (Patient taking differently: Take 200 mg by mouth daily. )  . busPIRone (BUSPAR) 5 MG tablet TAKE 1 TABLET(5 MG) BY MOUTH TWICE DAILY  . cholecalciferol (VITAMIN D) 1000 UNITS tablet Take 2,000 Units by mouth daily.  . Cranberry 500 MG TABS Take 2 tablets by mouth daily.  Marland Kitchen doxazosin (CARDURA) 1 MG tablet TAKE 1 TABLET(1 MG) BY MOUTH TWICE DAILY (Patient taking differently: Half the time takes once daily based on low BP)  . ELIQUIS 5 MG TABS tablet TAKE 1 TABLET(5 MG) BY MOUTH TWICE DAILY  . furosemide (LASIX) 20 MG tablet Take 20 mg by mouth See admin instructions. Take 1 tablet (30m) by mouth every morning - take 1 additional tablet (252m after lunch if needed for swelling  . levothyroxine (SYNTHROID) 137 MCG tablet TAKE 1 TABLET(137 MCG) BY MOUTH EVERY MORNING  . Multiple Vitamin (MULITIVITAMIN WITH MINERALS) TABS Take 1 tablet by mouth daily.  . potassium chloride (KLOR-CON) 10 MEQ tablet TAKE 1 TABLET BY MOUTH EVERY DAY AS DIRECTED. MAY TAKE AN EXTRA PILL AFTER LUNCH WITH FLUID PILL AS NEEDED FOR SWELLING  . rosuvastatin (CRESTOR) 40 MG tablet TAKE 1 TABLET(40 MG) BY MOUTH DAILY  . temazepam (RESTORIL) 15 MG capsule  TAKE 1 CAPSULE(15 MG) BY MOUTH AT BEDTIME AS NEEDED FOR SLEEP (Patient taking differently: About twice weekly)  . valsartan (DIOVAN) 320 MG tablet TAKE 1 TABLET(320 MG) BY MOUTH DAILY (Patient taking differently: Take 320 mg by mouth daily. )  . amLODipine (NORVASC) 10 MG tablet Take 1 tablet (10 mg total) by mouth daily.  . metoprolol succinate (TOPROL-XL) 50 MG 24 hr tablet Take with or immediately following a meal. (Patient not taking: Reported on 06/19/2020)   No facility-administered encounter medications on file as of 06/16/2020.      Financial Resource Strain: Low Risk   . Difficulty of Paying Living Expenses: Not hard at all    Current Diagnosis/Assessment:  Goals Addressed            This Visit's Progress   . Chronic Care Management       CARE PLAN ENTRY (see longitudinal plan of care for additional care plan information)  Current Barriers:  . Chronic Disease Management support, education, and care coordination needs related to Hypertension, Hyperlipidemia, and Atrial Fibrillation   Hypertension BP Readings from Last 3 Encounters:  06/09/20 (!) 168/67  05/25/20 (!) 130/50  04/21/20 129/61   . Pharmacist Clinical Goal(s): o Over the next 90 days, patient will work with PharmD and providers to maintain BP goal <140/90 . Current regimen:  . Doxazosin 54m20mwice daily, once daily if BP low . Diovan 320m454mily . Amlodipine 10mg45mly . Interventions: o None .  Patient self care activities - Over the next 90 days, patient will: o Check BP daily, document, and provide at future appointments o Ensure daily salt intake < 2300 mg/day  Hyperlipidemia Lab Results  Component Value Date/Time   LDLCALC 54 06/02/2019 08:36 AM   . Pharmacist Clinical Goal(s): o Over the next 90 days, patient will work with PharmD and providers to maintain LDL goal < 100 . Current regimen:  o Crestor 29m daily . Interventions: o None . Patient self care activities - Over the next 90  days, patient will: o Continue current medication and lifestyle practices  Atrial Fibrillation . Pharmacist Clinical Goal(s) o Over the next 90 days, patient will work with PharmD and providers to maintain ideal blood (water) volume . Current regimen:  o Amiodarone 20743mdaily o Eliquis 43m40mwice daily . Interventions: o None . Patient self care activities - Over the next 90 days, patient will: o Notify cardiology if water weight becomes too high (swelling), or too low (dizziness)  Medication management . Pharmacist Clinical Goal(s): o Over the next 90 days, patient will work with PharmD and providers to maintain optimal medication adherence . Current pharmacy: [facility] . Interventions o Comprehensive medication review performed. o Continue current medication management strategy . Patient self care activities - Over the next 90 days, patient will: o Focus on medication adherence by continuing current practices o Take medications as prescribed o Report any questions or concerns to PharmD and/or provider(s)  Initial goal documentation       Hypertension   BP goal is:  <140/90  Office blood pressures are  BP Readings from Last 3 Encounters:  06/09/20 (!) 168/67  05/25/20 (!) 130/50  04/21/20 129/61   Patient checks BP at home twice daily Patient home BP readings are ranging: 130/50,   Patient has failed these meds in the past: metoprolol Patient is currently controlled on the following medications:  . Doxazosin 1mg73md, once daily if BP low . Diovan 320mg61mly . Amlodipine 10mg 76my  We discussed:  Home BP much better than office  Plan  Continue current medications   AFIB   Patient is currently rhythm controlled. HR 60 BPM  Patient has failed these meds in past: NA Patient is currently controlled on the following medications: Amiodarone, Diovan, Eliquis  We discussed: Gets hypovolemic Extra Lasix about twice weekly, extra potassium with extra  Lasix  Plan  Continue current medications  Hyperlipidemia   LDL goal < 100  Lipid Panel     Component Value Date/Time   CHOL 128 06/02/2019 0836   CHOL 164 06/06/2016 0907   TRIG 86 06/02/2019 0836   HDL 57 06/02/2019 0836   HDL 54 06/06/2016 0907   LDLCALC 54 06/02/2019 0836    Hepatic Function Latest Ref Rng & Units 03/29/2020 06/02/2019 09/12/2018  Total Protein 6.5 - 8.1 g/dL 7.0 7.1 6.9  Albumin 3.5 - 5.0 g/dL 4.4 - -  AST 15 - 41 U/L 34 23 22  ALT 0 - 44 U/L _0 Alk Phosphatase 38 - 126 U/L 64 - -  Total Bilirubin 0.3 - 1.2 mg/dL 0.7 0.7 0.7  Bilirubin, Direct 0.1 - 0.5 mg/dL - - -     The ASCVD Risk score ([place] 2013) failed to calculate for the following reasons:   The valid total cholesterol range is 130 to 320 mg/dL   Patient has failed these meds in past: NA Patient is currently controlled on the  following medications:  . Crestor 77m daily  We discussed: At goal Denies myalgias  Plan  Continue current medications  Medication Management   [facility]? Yes Pt endorses 100% compliance  We discussed:  [employer] told her to use this particular store APAP every other day. Max 2 tablets. Buspar effective for mood Restoril about twice weekly  Plan  Continue current medication management strategy  Follow up: 3 month phone visit  TMilus Height PharmD, [phone]

## 2020-06-16 NOTE — Progress Notes (Deleted)
   Chronic Care Management Pharmacy  Name: [name]  MRN: [id number] DOB: [birth year]  Chief Complaint/ HPI  [name],  79 y.o. , female presents for their {Initial/Follow-up:3041532} CCM visit with the clinical pharmacist {CHL [facility] visit QTMA:[id number]}.  PCP : Leslie Sizer, MD  Their chronic conditions include: {CHL AMB CHRONIC MEDICAL CONDITIONS:[phone]}  Office Visits:***  Consult Visit:***  Medications: Outpatient Encounter Medications as of 06/16/2020  Medication Sig  . acetaminophen (TYLENOL) 650 MG CR tablet Take 650-1,300 mg by mouth every 8 (eight) hours as needed for pain.   Marland Kitchen amiodarone (PACERONE) 200 MG tablet TAKE 1 TABLET(200 MG) BY MOUTH DAILY (Patient taking differently: Take 200 mg by mouth daily. )  . amLODipine (NORVASC) 10 MG tablet Take 1 tablet (10 mg total) by mouth daily.  . busPIRone (BUSPAR) 5 MG tablet TAKE 1 TABLET(5 MG) BY MOUTH TWICE DAILY  . cholecalciferol (VITAMIN D) 1000 UNITS tablet Take 2,000 Units by mouth daily.  Marland Kitchen doxazosin (CARDURA) 1 MG tablet TAKE 1 TABLET(1 MG) BY MOUTH TWICE DAILY  . ELIQUIS 5 MG TABS tablet TAKE 1 TABLET(5 MG) BY MOUTH TWICE DAILY  . furosemide (LASIX) 20 MG tablet Take 20 mg by mouth See admin instructions. Take 1 tablet (20mg ) by mouth every morning - take 1 additional tablet (20mg ) after lunch if needed for swelling  . levothyroxine (SYNTHROID) 137 MCG tablet TAKE 1 TABLET(137 MCG) BY MOUTH EVERY MORNING  . metoprolol succinate (TOPROL-XL) 50 MG 24 hr tablet Take with or immediately following a meal. (Patient taking differently: Take 50 mg by mouth daily. )  . Multiple Vitamin (MULITIVITAMIN WITH MINERALS) TABS Take 1 tablet by mouth daily.  . potassium chloride (KLOR-CON) 10 MEQ tablet TAKE 1 TABLET BY MOUTH EVERY DAY AS DIRECTED. MAY TAKE AN EXTRA PILL AFTER LUNCH WITH FLUID PILL AS NEEDED FOR SWELLING  . rosuvastatin (CRESTOR) 40 MG tablet TAKE 1 TABLET(40 MG) BY MOUTH DAILY  .  temazepam (RESTORIL) 15 MG capsule TAKE 1 CAPSULE(15 MG) BY MOUTH AT BEDTIME AS NEEDED FOR SLEEP  . valsartan (DIOVAN) 320 MG tablet TAKE 1 TABLET(320 MG) BY MOUTH DAILY (Patient taking differently: Take 320 mg by mouth daily. )   No facility-administered encounter medications on file as of 06/16/2020.     Current Diagnosis/Assessment:  Goals Addressed   None     Medication Management   Pt uses [facility] for all medications Uses pill box? Yes Pt endorses ***% compliance  We discussed: ***  Plan  {US Pharmacy BWLS:[id number]}    Follow up: *** month phone visit  ***

## 2020-06-19 NOTE — Patient Instructions (Addendum)
Visit Information  Goals Addressed            This Visit's Progress   . Chronic Care Management       CARE PLAN ENTRY (see longitudinal plan of care for additional care plan information)  Current Barriers:  . Chronic Disease Management support, education, and care coordination needs related to Hypertension, Hyperlipidemia, and Atrial Fibrillation   Hypertension BP Readings from Last 3 Encounters:  06/09/20 (!) 168/67  05/25/20 (!) 130/50  04/21/20 129/61   . Pharmacist Clinical Goal(s): o Over the next 90 days, patient will work with PharmD and providers to maintain BP goal <140/90 . Current regimen:  . Doxazosin 1mg  twice daily, once daily if BP low . Diovan 320mg  daily . Amlodipine 10mg  daily . Interventions: o None . Patient self care activities - Over the next 90 days, patient will: o Check BP daily, document, and provide at future appointments o Ensure daily salt intake < 2300 mg/day  Hyperlipidemia Lab Results  Component Value Date/Time   LDLCALC 54 06/02/2019 08:36 AM   . Pharmacist Clinical Goal(s): o Over the next 90 days, patient will work with PharmD and providers to maintain LDL goal < 100 . Current regimen:  o Crestor 40mg  daily . Interventions: o None . Patient self care activities - Over the next 90 days, patient will: o Continue current medication and lifestyle practices  Atrial Fibrillation . Pharmacist Clinical Goal(s) o Over the next 90 days, patient will work with PharmD and providers to maintain ideal blood (water) volume . Current regimen:  o Amiodarone 200mg  daily o Eliquis 5mg  twice daily . Interventions: o None . Patient self care activities - Over the next 90 days, patient will: o Notify cardiology if water weight becomes too high (swelling), or too low (dizziness)  Medication management . Pharmacist Clinical Goal(s): o Over the next 90 days, patient will work with PharmD and providers to maintain optimal medication  adherence . Current pharmacy: [facility] . Interventions o Comprehensive medication review performed. o Continue current medication management strategy . Patient self care activities - Over the next 90 days, patient will: o Focus on medication adherence by continuing current practices o Take medications as prescribed o Report any questions or concerns to PharmD and/or provider(s)  Initial goal documentation        [name] was given information about Chronic Care Management services today including:  1. CCM service includes personalized support from designated clinical staff supervised by her physician, including individualized plan of care and coordination with other care providers 2. 24/7 contact phone numbers for assistance for urgent and routine care needs. 3. Standard insurance, coinsurance, copays and deductibles apply for chronic care management only during months in which we provide at least 20 minutes of these services. Most insurances cover these services at 100%, however patients may be responsible for any copay, coinsurance and/or deductible if applicable. This service may help you avoid the need for more expensive face-to-face services. 4. Only one practitioner may furnish and bill the service in a calendar month. 5. The patient may stop CCM services at any time (effective at the end of the month) by phone call to the office staff.  Patient agreed to services and verbal consent obtained.   Print copy of patient instructions provided.  Telephone follow up appointment with pharmacy team member scheduled for: 3 months  Milus Height, PharmD, [place], [facility] [phone]   Atrial Fibrillation  Atrial fibrillation is a type of  irregular or rapid heartbeat (arrhythmia). In atrial fibrillation, the top part of the heart (atria) beats in an irregular pattern. This makes the heart unable to pump blood normally and effectively. The goal  of treatment is to prevent blood clots from forming, control your heart rate, or restore your heartbeat to a normal rhythm. If this condition is not treated, it can cause serious problems, such as a weakened heart muscle (cardiomyopathy) or a stroke. What are the causes? This condition is often caused by medical conditions that damage the heart's electrical system. These include:  High blood pressure (hypertension). This is the most common cause.  Certain heart problems or conditions, such as heart failure, coronary artery disease, heart valve problems, or heart surgery.  Diabetes.  Overactive thyroid (hyperthyroidism).  Obesity.  Chronic kidney disease. In some cases, the cause of this condition is not known. What increases the risk? This condition is more likely to develop in:  Older people.  People who smoke.  Athletes who do endurance exercise.  People who have a family history of atrial fibrillation.  Men.  People who use drugs.  People who drink a lot of alcohol.  People who have lung conditions, such as emphysema, pneumonia, or COPD.  People who have obstructive sleep apnea. What are the signs or symptoms? Symptoms of this condition include:  A feeling that your heart is racing or beating irregularly.  Discomfort or pain in your chest.  Shortness of breath.  Sudden light-headedness or weakness.  Tiring easily during exercise or activity.  Fatigue.  Syncope (fainting).  Sweating. In some cases, there are no symptoms. How is this diagnosed? Your health care provider may detect atrial fibrillation when taking your pulse. If detected, this condition may be diagnosed with:  An electrocardiogram (ECG) to check electrical signals of the heart.  An ambulatory cardiac monitor to record your heart's activity for a few days.  A transthoracic echocardiogram (TTE) to create pictures of your heart.  A transesophageal echocardiogram (TEE) to create even closer  pictures of your heart.  A stress test to check your blood supply while you exercise.  Imaging tests, such as a CT scan or chest X-ray.  Blood tests. How is this treated? Treatment depends on underlying conditions and how you feel when you experience atrial fibrillation. This condition may be treated with:  Medicines to prevent blood clots or to treat heart rate or heart rhythm problems.  Electrical cardioversion to reset the heart's rhythm.  A pacemaker to correct abnormal heart rhythm.  Ablation to remove the heart tissue that sends abnormal signals.  Left atrial appendage closure to seal the area where blood clots can form. In some cases, underlying conditions will be treated. Follow these instructions at home: Medicines  Take over-the counter and prescription medicines only as told by your health care provider.  Do not take any new medicines without talking to your health care provider.  If you are taking blood thinners: ? Talk with your health care provider before you take any medicines that contain aspirin or NSAIDs, such as ibuprofen. These medicines increase your risk for dangerous bleeding. ? Take your medicine exactly as told, at the same time every day. ? Avoid activities that could cause injury or bruising, and follow instructions about how to prevent falls. ? Wear a medical alert bracelet or carry a card that lists what medicines you take. Lifestyle      Do not use any products that contain nicotine or tobacco, such as cigarettes,  e-cigarettes, and chewing tobacco. If you need help quitting, ask your health care provider.  Eat heart-healthy foods. Talk with a dietitian to make an eating plan that is right for you.  Exercise regularly as told by your health care provider.  Do not drink alcohol.  Lose weight if you are overweight.  Do not use drugs, including cannabis. General instructions  If you have obstructive sleep apnea, manage your condition as told  by your health care provider.  Do not use diet pills unless your health care provider approves. Diet pills can make heart problems worse.  Keep all follow-up visits as told by your health care provider. This is important. Contact a health care provider if you:  Notice a change in the rate, rhythm, or strength of your heartbeat.  Are taking a blood thinner and you notice more bruising.  Tire more easily when you exercise or do heavy work.  Have a sudden change in weight. Get help right away if you have:   Chest pain, abdominal pain, sweating, or weakness.  Trouble breathing.  Side effects of blood thinners, such as blood in your vomit, stool, or urine, or bleeding that cannot stop.  Any symptoms of a stroke. "BE FAST" is an easy way to remember the main warning signs of a stroke: ? B - Balance. Signs are dizziness, sudden trouble walking, or loss of balance. ? E - Eyes. Signs are trouble seeing or a sudden change in vision. ? F - Face. Signs are sudden weakness or numbness of the face, or the face or eyelid drooping on one side. ? A - Arms. Signs are weakness or numbness in an arm. This happens suddenly and usually on one side of the body. ? S - Speech. Signs are sudden trouble speaking, slurred speech, or trouble understanding what people say. ? T - Time. Time to call emergency services. Write down what time symptoms started.  Other signs of a stroke, such as: ? A sudden, severe headache with no known cause. ? Nausea or vomiting. ? Seizure. These symptoms may represent a serious problem that is an emergency. Do not wait to see if the symptoms will go away. Get medical help right away. Call your local emergency services (911 in the [place] Do not drive yourself to the hospital. Summary  Atrial fibrillation is a type of irregular or rapid heartbeat (arrhythmia).  Symptoms include a feeling that your heart is beating fast or irregularly.  You may be given medicines to prevent  blood clots or to treat heart rate or heart rhythm problems.  Get help right away if you have signs or symptoms of a stroke.  Get help right away if you cannot catch your breath or have chest pain or pressure. This information is not intended to replace advice given to you by your health care provider. Make sure you discuss any questions you have with your health care provider. Document Revised: 04/02/2019 Document Reviewed: 04/02/2019 Elsevier Patient Education  [place].

## 2020-06-25 ENCOUNTER — Other Ambulatory Visit: Payer: Self-pay | Admitting: Cardiovascular Disease

## 2020-07-19 ENCOUNTER — Other Ambulatory Visit: Payer: Self-pay | Admitting: Family Medicine

## 2020-07-19 DIAGNOSIS — L578 Other skin changes due to chronic exposure to nonionizing radiation: Secondary | ICD-10-CM | POA: Diagnosis not present

## 2020-07-19 DIAGNOSIS — Z1231 Encounter for screening mammogram for malignant neoplasm of breast: Secondary | ICD-10-CM

## 2020-07-19 DIAGNOSIS — D485 Neoplasm of uncertain behavior of skin: Secondary | ICD-10-CM | POA: Diagnosis not present

## 2020-07-19 DIAGNOSIS — Z872 Personal history of diseases of the skin and subcutaneous tissue: Secondary | ICD-10-CM | POA: Diagnosis not present

## 2020-07-19 DIAGNOSIS — C44719 Basal cell carcinoma of skin of left lower limb, including hip: Secondary | ICD-10-CM | POA: Diagnosis not present

## 2020-07-19 DIAGNOSIS — Z780 Asymptomatic menopausal state: Secondary | ICD-10-CM

## 2020-07-19 DIAGNOSIS — C44519 Basal cell carcinoma of skin of other part of trunk: Secondary | ICD-10-CM | POA: Diagnosis not present

## 2020-07-19 DIAGNOSIS — Z85828 Personal history of other malignant neoplasm of skin: Secondary | ICD-10-CM | POA: Diagnosis not present

## 2020-07-21 ENCOUNTER — Telehealth: Payer: Self-pay

## 2020-07-22 NOTE — Progress Notes (Signed)
..   Reviewed chart prior to disease state call. Spoke with patient regarding BP  Recent Office Vitals: BP Readings from Last 3 Encounters:  06/09/20 (!) 168/67  05/25/20 (!) 130/50  04/21/20 129/61   Pulse Readings from Last 3 Encounters:  06/09/20 (!) 50  05/25/20 61  04/21/20 80    Wt Readings from Last 3 Encounters:  06/09/20 129 lb (58.5 kg)  05/25/20 128 lb 9.6 oz (58.3 kg)  04/21/20 129 lb (58.5 kg)     Kidney Function Lab Results  Component Value Date/Time   CREATININE 0.77 03/31/2020 04:49 AM   CREATININE 0.79 03/30/2020 04:12 AM   CREATININE 1.25 (H) 02/02/2020 02:30 PM   CREATININE 0.93 06/02/2019 08:36 AM   GFRNONAA >60 03/31/2020 04:49 AM   GFRNONAA 41 (L) 02/02/2020 02:30 PM   GFRAA >60 03/31/2020 04:49 AM   GFRAA 48 (L) 02/02/2020 02:30 PM    BMP Latest Ref Rng & Units 03/31/2020 03/30/2020 03/29/2020  Glucose 70 - 99 mg/dL - 112(H) 203(H)  BUN 8 - 23 mg/dL - 12 19  Creatinine 0.44 - 1.00 mg/dL 0.77 0.79 1.08(H)  BUN/Creat Ratio 6 - 22 (calc) - - -  Sodium 135 - 145 mmol/L - 139 134(L)  Potassium 3.5 - 5.1 mmol/L - 3.8 3.8  Chloride 98 - 111 mmol/L - 107 100  CO2 22 - 32 mmol/L - 26 24  Calcium 8.9 - 10.3 mg/dL - 8.8(L) 8.9    . Current antihypertensive regimen:  o diet . How often are you checking your Blood Pressure? daily . Current home BP readings: 100/60 in am. Drinks large glass of water and reports readings of 120/80  . What recent interventions/DTPs have been made by any provider to improve Blood Pressure control since last CPP Visit: no . Any recent hospitalizations or ED visits since last visit with CPP? No . What diet changes have been made to improve Blood Pressure Control?  o Working on decreasing salt. Having trouble decreasing salt with breakfast. . What exercise is being done to improve your Blood Pressure Control?  o none  Adherence Review: Is the patient currently on ACE/ARB medication? Yes Does the patient have >5 day gap between  last estimated fill dates? No   Doristine Counter, CMA

## 2020-07-30 ENCOUNTER — Other Ambulatory Visit: Payer: Self-pay | Admitting: Family Medicine

## 2020-07-30 DIAGNOSIS — F411 Generalized anxiety disorder: Secondary | ICD-10-CM

## 2020-07-30 NOTE — Progress Notes (Signed)
Name: [name]   MRN: [id number]    DOB: [birth year]   Date:08/02/2020       Progress Note  Subjective  Chief Complaint  Chief Complaint  Patient presents with  . Follow-up    4 month follow up  . Sore Throat    Onset 08/01/20  . Cough    onset 08/01/20    HPI  URI: she developed a dry cough, rhinorrhea and sore throat ( like a tickle) . She has been wearing her mask, has been at home, had two doses of COVID-19 vaccine and is due for booster. She denies fever, sob or body aches. Discussed social isolation , monitor pulse at home and if below 90 % call 911   GAD: doing well on Buspar twice daily, she has been taking for many years and no side effects , she also takes temazepam qhs prn and it helps her sleep,she states taking Temazepam occasionally only for sleep a couple of times a week only She denies feeling nervous, no history of panic attacks. She was given medication when her husband died in 02-02-2003, advised to try to wean self off    Patient Active Problem List   Diagnosis Date Noted  . Pyelonephritis 03/30/2020  . Acute pyelonephritis 03/29/2020  . Hydronephrosis of right kidney 03/29/2020  . Depression 03/29/2020  . Chronic heart failure ([place]) 05/24/2018  . Atrial fibrillation ([place]) 05/24/2018  . CKD (chronic kidney disease), stage III ([place]) 05/17/2018  . 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 Shortness of breath 05/07/2018  . Pulmonary hypertension, unspecified ([place]) 04/05/2018  . Chronic combined systolic (congestive) and diastolic (congestive) heart failure ([place]) 04/05/2018  . Paroxysmal sinus tachycardia ([place]) 01/29/2018  . Leg pain 01/04/2018  . PAD (peripheral artery disease) ([place]) 01/04/2018  . Carotid artery calcification, bilateral 12/19/2017  . Cervical radiculitis 12/19/2017  . Hyperglycemia 03/06/2016  . Hypothyroid 04/21/2015  . 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 Anxiety 04/21/2015  . Hyperlipidemia 04/21/2015  .  Diuretic-induced hypokalemia 04/21/2015  . Palpitations 12/18/2012  . Hypertension 05/27/2012    Past Surgical History:  Procedure Laterality Date  . BACK SURGERY  07/2019  . CARDIOVERSION N/A 03/26/2018   Procedure: CARDIOVERSION;  Surgeon: Nelva Bush, MD;  Location: [facility] ORS;  Service: Cardiovascular;  Laterality: N/A;  . CARDIOVERSION N/A 04/24/2018   Procedure: CARDIOVERSION;  Surgeon: Minna Merritts, MD;  Location: [facility] ORS;  Service: Cardiovascular;  Laterality: N/A;  . ELECTROPHYSIOLOGY STUDY N/A 02/27/2012   Procedure: ELECTROPHYSIOLOGY STUDY;  Surgeon: Evans Lance, MD;  Location: [facility] CATH LAB;  Service: Cardiovascular;  Laterality: N/A;  . EPS and ablation for SVT  5/13   slow pathway ablation by Dr Lovena Le  . EYE SURGERY     surgery for detatched retina  . NECK SURGERY  02/2020  . SUPRAVENTRICULAR TACHYCARDIA ABLATION N/A 02/27/2012   Procedure: SUPRAVENTRICULAR TACHYCARDIA ABLATION;  Surgeon: Evans Lance, MD;  Location: [facility] C[facility] LAB;  Service: Cardiovascular;  Laterality: N/A;    Family History  Problem Relation Age of Onset  . Alzheimer's disease Mother   . Stroke Father   . Breast cancer Father   . Heart disease Father        Pacemaker  . Breast cancer Paternal Aunt   . Kidney cancer Maternal Grandmother   . Alzheimer's disease Maternal Grandfather   . Thyroid disease Paternal Grandmother   . Stroke Paternal Grandfather     Social History   Tobacco Use  .  Smoking status: Never Smoker  . Smokeless tobacco: Never Used  . Tobacco comment: smoking cessation materials not required  Substance Use Topics  . Alcohol use: No     Current Outpatient Medications:  .  acetaminophen (TYLENOL) 650 MG CR tablet, Take 650-1,300 mg by mouth every 8 (eight) hours as needed for pain. , Disp: , Rfl:  .  amiodarone (PACERONE) 200 MG tablet, TAKE 1 TABLET(200 MG) BY MOUTH DAILY, Disp: 90 tablet, Rfl: 0 .  busPIRone (BUSPAR) 5 MG tablet, TAKE 1 TABLET(5 MG) BY MOUTH TWICE  DAILY, Disp: 180 tablet, Rfl: 0 .  cholecalciferol (VITAMIN D) 1000 UNITS tablet, Take 2,000 Units by mouth daily., Disp: , Rfl:  .  Cranberry 500 MG TABS, Take 2 tablets by mouth daily., Disp: , Rfl:  .  doxazosin (CARDURA) 1 MG tablet, TAKE 1 TABLET(1 MG) BY MOUTH TWICE DAILY (Patient taking differently: Half the time takes once daily based on low BP), Disp: 180 tablet, Rfl: 2 .  ELIQUIS 5 MG TABS tablet, TAKE 1 TABLET(5 MG) BY MOUTH TWICE DAILY, Disp: 60 tablet, Rfl: 5 .  furosemide (LASIX) 20 MG tablet, Take 20 mg by mouth See admin instructions. Take 1 tablet (20mg ) by mouth every morning - take 1 additional tablet (20mg ) after lunch if needed for swelling, Disp: , Rfl:  .  levothyroxine (SYNTHROID) 137 MCG tablet, TAKE 1 TABLET(137 MCG) BY MOUTH EVERY MORNING, Disp: 90 tablet, Rfl: 1 .  Multiple Vitamin (MULITIVITAMIN WITH MINERALS) TABS, Take 1 tablet by mouth daily., Disp: , Rfl:  .  potassium chloride (KLOR-CON) 10 MEQ tablet, TAKE 1 TABLET BY MOUTH EVERY DAY AS DIRECTED. MAY TAKE AN EXTRA PILL AFTER LUNCH WITH FLUID PILL AS NEEDED FOR SWELLING, Disp: 135 tablet, Rfl: 2 .  rosuvastatin (CRESTOR) 40 MG tablet, TAKE 1 TABLET(40 MG) BY MOUTH DAILY, Disp: 90 tablet, Rfl: 1 .  temazepam (RESTORIL) 15 MG capsule, TAKE 1 CAPSULE(15 MG) BY MOUTH AT BEDTIME AS NEEDED FOR SLEEP (Patient taking differently: About twice weekly), Disp: 30 capsule, Rfl: 0 .  valsartan (DIOVAN) 320 MG tablet, TAKE 1 TABLET(320 MG) BY MOUTH DAILY (Patient taking differently: Take 320 mg by mouth daily. ), Disp: 90 tablet, Rfl: 3 .  amLODipine (NORVASC) 10 MG tablet, Take 1 tablet (10 mg total) by mouth daily., Disp: 90 tablet, Rfl: 3 .  metoprolol succinate (TOPROL-XL) 50 MG 24 hr tablet, Take with or immediately following a meal. (Patient not taking: Reported on 06/19/2020), Disp: 90 tablet, Rfl: 0  Allergies  Allergen Reactions  . Penicillins Swelling and Rash    Has patient had a PCN reaction causing immediate rash,  facial/tongue/throat swelling, SOB or lightheadedness with hypotension: Yes Has patient had a PCN reaction causing severe rash involving mucus membranes or skin necrosis: No Has patient had a PCN reaction that required hospitalization: No Has patient had a PCN reaction occurring within the last 10 years: No If all of the above answers are "NO", then may proceed with Cephalosporin use.   . Codeine Nausea And Vomiting  . Erythromycin Itching and Swelling  . Septra [Sulfamethoxazole-Trimethoprim] Swelling    I personally reviewed active problem list, medication list, allergies, family history, social history, health maintenance with the patient/caregiver today.   ROS  Ten systems reviewed and is negative except as mentioned in HPI   Objective  Vitals:   08/02/20 0752  BP: 130/60  Pulse: (!) 50    There is no height or weight on file to calculate BMI.  Physical Exam  Awake, alert and oriented   PHQ2/9: Depression screen [facility] 2/9 08/02/2020 05/25/2020 04/02/2020 02/02/2020 12/03/2019  Decreased Interest 0 0 0 0 0  Down, Depressed, Hopeless 0 0 0 0 0  PHQ - 2 Score 0 0 0 0 0  Altered sleeping - - 0 0 0  Tired, decreased energy - - 0 0 0  Change in appetite - - 0 0 0  Feeling bad or failure about yourself  - - 0 0 0  Trouble concentrating - - 0 0 0  Moving slowly or fidgety/restless - - 0 0 0  Suicidal thoughts - - 0 0 0  PHQ-9 Score - - 0 0 0  Difficult doing work/chores - - - Not difficult at all -  Some recent data might be hidden    phq 9 is negative   Fall Risk: Fall Risk  08/02/2020 05/25/2020 04/02/2020 02/02/2020 12/03/2019  Falls in the past year? 0 0 0 0 0  Number falls in past yr: 0 0 0 0 0  Injury with Fall? 0 0 0 0 0  Comment - - - - -  Risk for fall due to : - No Fall Risks - - -  Risk for fall due to: Comment - - - - -  Follow up - Falls prevention discussed - - -     Functional Status Survey: Is the patient deaf or have difficulty hearing?: No Does the  patient have difficulty seeing, even when wearing glasses/contacts?: No Does the patient have difficulty concentrating, remembering, or making decisions?: No Does the patient have difficulty walking or climbing stairs?: No Does the patient have difficulty dressing or bathing?: No Does the patient have difficulty doing errands alone such as visiting a doctor's office or shopping?: No    Assessment & Plan  1. Viral upper respiratory tract infection  - magic mouthwash w/lidocaine SOLN; Take 5 mLs by mouth 4 (four) times daily as needed for mouth pain.  Dispense: 120 mL; Refill: 0  Discussed possibility of COVID-19, social isolation, what to watch for, sending symptoms control medication  2. GAD (generalized anxiety disorder)  - busPIRone (BUSPAR) 5 MG tablet; Take 1 tablet (5 mg total) by mouth 2 (two) times daily.  Dispense: 180 tablet; Refill: 1

## 2020-08-02 ENCOUNTER — Other Ambulatory Visit: Payer: Self-pay | Admitting: Family Medicine

## 2020-08-02 ENCOUNTER — Other Ambulatory Visit: Payer: Self-pay

## 2020-08-02 ENCOUNTER — Ambulatory Visit (INDEPENDENT_AMBULATORY_CARE_PROVIDER_SITE_OTHER): Payer: Medicare Other | Admitting: Family Medicine

## 2020-08-02 ENCOUNTER — Encounter: Payer: Self-pay | Admitting: Family Medicine

## 2020-08-02 VITALS — BP 130/60 | HR 50

## 2020-08-02 DIAGNOSIS — J069 Acute upper respiratory infection, unspecified: Secondary | ICD-10-CM | POA: Diagnosis not present

## 2020-08-02 DIAGNOSIS — Z1159 Encounter for screening for other viral diseases: Secondary | ICD-10-CM

## 2020-08-02 DIAGNOSIS — F411 Generalized anxiety disorder: Secondary | ICD-10-CM

## 2020-08-02 MED ORDER — MAGIC MOUTHWASH W/LIDOCAINE: 5.0000 mL | Freq: Four times a day (QID) | ORAL | 0 refills | Status: DC | PRN

## 2020-08-02 MED ORDER — BUSPIRONE HCL 5 MG PO TABS: 5.0000 mg | ORAL_TABLET | Freq: Two times a day (BID) | ORAL | 1 refills | Status: DC

## 2020-08-02 NOTE — Telephone Encounter (Signed)
Requested medication (s) are due for refill today - duplicate request  Requested medication (s) are on the active medication list -yes  Future visit scheduled -yes  Last refill: 08/02/20  Notes to clinic: Duplicate request- Rx sent to day was print. Medication not assigned protocol  Requested Prescriptions  Pending Prescriptions Disp Refills   magic mouthwash w/lidocaine SOLN 120 mL 0    Sig: Take 5 mLs by mouth 4 (four) times daily as needed for mouth pain.      Off-Protocol Failed - 08/02/2020  1:01 PM      Failed - Medication not assigned to a protocol, review manually.      Passed - Valid encounter within last 12 months    Recent Outpatient Visits           Today Viral upper respiratory tract infection   [facility] Steele Sizer, MD   4 months ago Hospital discharge follow-up   [facility] Steele Sizer, MD   6 months ago [facility] Steele Sizer, MD   8 months ago Dyslipidemia   [facility] Steele Sizer, MD   1 year ago Dyslipidemia   [facility] Steele Sizer, MD       Future Appointments             In 1 month Steele Sizer, MD [facility], [place]   In 9 months  [facility], [facility]           Gastroenterology:  Laxatives Passed - 08/02/2020  1:01 PM      Passed - Valid encounter within last 12 months    Recent Outpatient Visits           Today Viral upper respiratory tract infection   [facility] Steele Sizer, MD   4 months ago Hospital discharge follow-up   [facility] Steele Sizer, MD   6 months ago [facility] Steele Sizer, MD   8 months ago Dyslipidemia   [facility] Steele Sizer, MD   1 year ago Dyslipidemia   [facility] Steele Sizer, MD       Future  Appointments             In 1 month Steele Sizer, MD [facility], [place]   In 9 months  [facility], [place]:  OTC Passed - 08/02/2020  1:01 PM      Passed - Valid encounter within last 12 months    Recent Outpatient Visits           Today Viral upper respiratory tract infection   [facility] Steele Sizer, MD   4 months ago Hospital discharge follow-up   [facility] Steele Sizer, MD   6 months ago [facility] Steele Sizer, MD   8 months ago Dyslipidemia   [facility] Steele Sizer, MD   1 year ago Dyslipidemia   [facility] Steele Sizer, MD       Future Appointments             In 1 month Ancil Boozer, Drue Stager, MD [facility], [place]   In 9 months  [facility], [facility]  Requested Prescriptions  Pending Prescriptions Disp Refills   magic mouthwash w/lidocaine SOLN 120 mL 0    Sig: Take 5 mLs by mouth 4 (four) times daily as needed for mouth pain.      Off-Protocol Failed - 08/02/2020  1:01 PM      Failed - Medication not assigned to a protocol, review manually.      Passed - Valid encounter within last 12 months    Recent Outpatient Visits           Today Viral upper respiratory tract infection   [facility] Steele Sizer, MD   4 months ago Hospital discharge follow-up   [facility] Steele Sizer, MD   6 months ago [facility] Steele Sizer, MD   8 months ago Dyslipidemia   [facility] Steele Sizer, MD   1 year ago Dyslipidemia   [facility] Steele Sizer, MD       Future Appointments             In 1 month Steele Sizer, MD [facility], [place]   In 9 months  [facility]           Gastroenterology:  Laxatives Passed - 08/02/2020  1:01 PM      Passed - Valid encounter within last 12 months    Recent Outpatient Visits           Today Viral upper respiratory tract infection   [facility] Steele Sizer, MD   4 months ago Hospital discharge follow-up   [facility] Steele Sizer, MD   6 months ago [facility] Steele Sizer, MD   8 months ago Dyslipidemia   [facility] Steele Sizer, MD   1 year ago Dyslipidemia   [facility] Steele Sizer, MD       Future Appointments             In 1 month Steele Sizer, MD [facility] [place]   In 9 months  [facility], [place]:  OTC Passed - 08/02/2020  1:01 PM      Passed - Valid encounter within last 12 months    Recent Outpatient Visits           Today Viral upper respiratory tract infection   [facility] Steele Sizer, MD   4 months ago Hospital discharge follow-up   [facility] Steele Sizer, MD   6 months ago [facility] Steele Sizer, MD   8 months ago Dyslipidemia   [facility] Steele Sizer, MD   1 year ago Dyslipidemia   [facility] Steele Sizer, MD       Future Appointments             In 1 month Ancil Boozer, Drue Stager, MD [facility], [place]   In 9 months  [facility], [facility]

## 2020-08-02 NOTE — Telephone Encounter (Signed)
Medication: magic mouthwash w/lidocaine SOLN [[id number]] - Patient is calling to ask if this medication can be resent. The pharmacy did not receive it.  Has the patient contacted their pharmacy? YES  (Agent: If no, request that the patient contact the pharmacy for the refill.) (Agent: If yes, when and what did the pharmacy advise?)  Preferred Pharmacy (with phone number or street name): [facility] [place], [place] - [address]  [place], [place] [address]  Phone:  [phone] Fax:  [phone]   Agent: Please be advised that RX refills may take up to 3 business days. We ask that you follow-up with your pharmacy.

## 2020-08-03 ENCOUNTER — Other Ambulatory Visit: Payer: Self-pay

## 2020-08-11 ENCOUNTER — Telehealth: Payer: Self-pay | Admitting: Pharmacist

## 2020-08-11 NOTE — Progress Notes (Signed)
.. °  Reviewed chart prior to disease state call. Spoke with patient regarding BP  Recent Office Vitals: BP Readings from Last 3 Encounters:  08/02/20 130/60  06/09/20 (!) 168/67  05/25/20 (!) 130/50   Pulse Readings from Last 3 Encounters:  08/02/20 (!) 50  06/09/20 (!) 50  05/25/20 61    Wt Readings from Last 3 Encounters:  06/09/20 129 lb (58.5 kg)  05/25/20 128 lb 9.6 oz (58.3 kg)  04/21/20 129 lb (58.5 kg)     Kidney Function Lab Results  Component Value Date/Time   CREATININE 0.77 03/31/2020 04:49 AM   CREATININE 0.79 03/30/2020 04:12 AM   CREATININE 1.25 (H) 02/02/2020 02:30 PM   CREATININE 0.93 06/02/2019 08:36 AM   GFRNONAA >60 03/31/2020 04:49 AM   GFRNONAA 41 (L) 02/02/2020 02:30 PM   GFRAA >60 03/31/2020 04:49 AM   GFRAA 48 (L) 02/02/2020 02:30 PM    BMP Latest Ref Rng & Units 03/31/2020 03/30/2020 03/29/2020  Glucose 70 - 99 mg/dL - 112(H) 203(H)  BUN 8 - 23 mg/dL - 12 19  Creatinine 0.44 - 1.00 mg/dL 0.77 0.79 1.08(H)  BUN/Creat Ratio 6 - 22 (calc) - - -  Sodium 135 - 145 mmol/L - 139 134(L)  Potassium 3.5 - 5.1 mmol/L - 3.8 3.8  Chloride 98 - 111 mmol/L - 107 100  CO2 22 - 32 mmol/L - 26 24  Calcium 8.9 - 10.3 mg/dL - 8.8(L) 8.9     Current antihypertensive regimen:  o Metoprolol, valsartan, furosemide, amlodipine, doxazosine, eliquis, rosuvastatin, amiodarone   How often are you checking your Blood Pressure? twice daily  Current home BP readings: 136/55  What recent interventions/DTPs have been made by any provider to improve Blood Pressure control since last CPP Visit: Started metoprolol XL  Any recent hospitalizations or ED visits since last visit with CPP? No  What diet changes have been made to improve Blood Pressure Control?  o Watches salt intake  What exercise is being done to improve your Blood Pressure Control?  o Active life style  Adherence Review: Is the patient currently on ACE/ARB medication? Yes Does the patient have >5 day gap  between last estimated fill dates? No

## 2020-08-12 DIAGNOSIS — C44719 Basal cell carcinoma of skin of left lower limb, including hip: Secondary | ICD-10-CM | POA: Diagnosis not present

## 2020-08-12 DIAGNOSIS — C44519 Basal cell carcinoma of skin of other part of trunk: Secondary | ICD-10-CM | POA: Diagnosis not present

## 2020-08-26 DIAGNOSIS — C44519 Basal cell carcinoma of skin of other part of trunk: Secondary | ICD-10-CM | POA: Diagnosis not present

## 2020-08-26 DIAGNOSIS — C44719 Basal cell carcinoma of skin of left lower limb, including hip: Secondary | ICD-10-CM | POA: Diagnosis not present

## 2020-08-28 ENCOUNTER — Other Ambulatory Visit: Payer: Self-pay

## 2020-08-28 ENCOUNTER — Encounter: Payer: Self-pay | Admitting: Emergency Medicine

## 2020-08-28 ENCOUNTER — Emergency Department
Admission: EM | Admit: 2020-08-28 | Discharge: 2020-08-28 | Disposition: A | Discharge status: Home / Self Care | Payer: Medicare Other | Attending: Emergency Medicine | Admitting: Emergency Medicine

## 2020-08-28 DIAGNOSIS — Z7901 Long term (current) use of anticoagulants: Secondary | ICD-10-CM | POA: Insufficient documentation

## 2020-08-28 DIAGNOSIS — E039 Hypothyroidism, unspecified: Secondary | ICD-10-CM | POA: Insufficient documentation

## 2020-08-28 DIAGNOSIS — I5042 Chronic combined systolic (congestive) and diastolic (congestive) heart failure: Secondary | ICD-10-CM | POA: Diagnosis not present

## 2020-08-28 DIAGNOSIS — N390 Urinary tract infection, site not specified: Secondary | ICD-10-CM | POA: Insufficient documentation

## 2020-08-28 DIAGNOSIS — N183 Chronic kidney disease, stage 3 unspecified: Secondary | ICD-10-CM | POA: Insufficient documentation

## 2020-08-28 DIAGNOSIS — I130 Hypertensive heart and chronic kidney disease with heart failure and stage 1 through stage 4 chronic kidney disease, or unspecified chronic kidney disease: Secondary | ICD-10-CM | POA: Insufficient documentation

## 2020-08-28 DIAGNOSIS — Z79899 Other long term (current) drug therapy: Secondary | ICD-10-CM | POA: Diagnosis not present

## 2020-08-28 DIAGNOSIS — R350 Frequency of micturition: Secondary | ICD-10-CM | POA: Diagnosis present

## 2020-08-28 LAB — URINALYSIS, COMPLETE (UACMP) WITH MICROSCOPIC
Bacteria, UA: NONE SEEN
Bilirubin Urine: NEGATIVE
Glucose, UA: NEGATIVE mg/dL
Ketones, ur: NEGATIVE mg/dL
Nitrite: NEGATIVE
Protein, ur: 30 mg/dL — AB
Specific Gravity, Urine: 1.009 (ref 1.005–1.030)
WBC, UA: >50 WBC/hpf — ABNORMAL HIGH (ref 0–5)
pH: 5 (ref 5.0–8.0)

## 2020-08-28 MED ORDER — CIPROFLOXACIN IN D5W 400 MG/200ML IV SOLN
400.0000 mg | Freq: Once | INTRAVENOUS | Status: AC
  Administered 2020-08-28: 400 mg via INTRAVENOUS
  Filled 2020-08-28: qty 200

## 2020-08-28 MED ORDER — SODIUM CHLORIDE 0.9 % IV BOLUS
500.0000 mL | Freq: Once | INTRAVENOUS | Status: AC
  Administered 2020-08-28: 500 mL via INTRAVENOUS

## 2020-08-28 MED ORDER — CIPROFLOXACIN HCL 500 MG PO TABS: 500.0000 mg | ORAL_TABLET | Freq: Two times a day (BID) | ORAL | 0 refills | Status: AC

## 2020-08-28 NOTE — Discharge Instructions (Signed)
Call your primary care provider and schedule appointment so that your urine can be rechecked after finishing the antibiotic.  Increase fluids.  You may also drink cranberry juice which will help with the urinary tract infection.  Tylenol if needed for pain, inflammation.  Return to the emergency department if any severe worsening of your symptoms such as fever, vomiting or increased back pain and chills.

## 2020-08-28 NOTE — ED Triage Notes (Signed)
Pt to ED via POV stating that she thinks that she has a UTI. Pt states that she started having painful urination last night, pt also reports being up and down all night having to urinate. Pt also reports chills. Pt is in NAD.

## 2020-08-28 NOTE — ED Provider Notes (Signed)
d  [facility] Emergency Department Provider Note  ____________________________________________   First MD Initiated Contact with Patient 08/28/20 1041     (approximate)  I have reviewed the triage vital signs and the nursing notes.   HISTORY  Chief Complaint Dysuria   HPI [name] is a 79 y.o. female presents to the ED with urinary frequency that started last night.  Patient states that she also has some chills but is unaware of any fever.  Patient had nausea but that this has improved after taking some Dramamine.  Patient reports a history of urinary tract infection starting in July of this year.  She was started on outpatient antibiotics but returned with a pyelonephritis and required hospitalization.       Past Medical History:  Diagnosis Date  . Anxiety   . Bronchitis   . Chronic combined systolic (congestive) and diastolic (congestive) heart failure ([place])    a. 02/2018 Echo: EF 45-50%, diff HK, triv AI, mod MR, mildly dil LA/RA, nl RV fxn, mod TR. PASP 40-42mmHg.  Gonzales Kitchen Chronic kidney disease   . DJD (degenerative joint disease), cervical   . History of stress test    a. 01/2008 MV: EF 76%. Fair ex tol. No ischemia.  . Hyperlipidemia   . Hypertension   . Hypothyroid   . LBBB (left bundle branch block)   . Leg pain    a. 01/2018 ABI's wnl.  . Lumbar spondylosis   . Persistent atrial fibrillation ([place])    a. 01/2018 Event monitor: 45 runs of SVT, longest 14.5 sec. SVT felt to be Afib/flutter-->2% burden. Longest run of AF 4h 31m (CHA2DS2VASc = 5-->Xarelto); b. 03/2019 s/p DCCV; c. 04/2019 Repeat Amio load and DCCV.  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 Pyelonephritis 03/30/2020  . Acute pyelonephritis 03/29/2020  . Hydronephrosis of right kidney 03/29/2020  . Depression 03/29/2020  . Chronic heart failure ([place]) 05/24/2018  . Atrial  fibrillation ([place]) 05/24/2018  . CKD (chronic kidney disease), stage III ([place]) 05/17/2018  . 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 Shortness of breath 05/07/2018  . Pulmonary hypertension, unspecified ([place]) 04/05/2018  . Chronic combined systolic (congestive) and diastolic (congestive) heart failure ([place]) 04/05/2018  . Paroxysmal sinus tachycardia ([place]) 01/29/2018  . Leg pain 01/04/2018  . PAD (peripheral artery disease) ([place]) 01/04/2018  . Carotid artery calcification, bilateral 12/19/2017  . Cervical radiculitis 12/19/2017  . Hyperglycemia 03/06/2016  . Hypothyroid 04/21/2015  . 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 Anxiety 04/21/2015  . Hyperlipidemia 04/21/2015  . Diuretic-induced hypokalemia 04/21/2015  . Palpitations 12/18/2012  . Hypertension 05/27/2012    Past Surgical History:  Procedure Laterality Date  . BACK SURGERY  07/2019  . CARDIOVERSION N/A 03/26/2018   Procedure: CARDIOVERSION;  Surgeon: Nelva Bush, MD;  Location: [facility] ORS;  Service: Cardiovascular;  Laterality: N/A;  . CARDIOVERSION N/A 04/24/2018   Procedure: CARDIOVERSION;  Surgeon: Minna Merritts, MD;  Location: [facility] ORS;  Service: Cardiovascular;  Laterality: N/A;  . ELECTROPHYSIOLOGY STUDY N/A 02/27/2012   Procedure: ELECTROPHYSIOLOGY STUDY;  Surgeon: Evans Lance, MD;  Location: [facility] CATH LAB;  Service: Cardiovascular;  Laterality: N/A;  . EPS and ablation for SVT  5/13   slow pathway ablation by Dr Lovena Le  . EYE SURGERY     surgery for detatched retina  . NECK SURGERY  02/2020  . SUPRAVENTRICULAR TACHYCARDIA ABLATION N/A 02/27/2012   Procedure: SUPRAVENTRICULAR TACHYCARDIA ABLATION;  Surgeon: Evans Lance, MD;  Location: [facility] CATH LAB;  Service: Cardiovascular;  Laterality: N/A;    Prior to Admission medications   Medication Sig Start Date End Date Taking? Authorizing Provider  acetaminophen (TYLENOL) 650 MG CR tablet Take 650-1,300 mg by mouth every 8 (eight) hours as  needed for pain.     [provider]  amiodarone (PACERONE) 200 MG tablet TAKE 1 TABLET(200 MG) BY MOUTH DAILY 06/25/20   Minna Merritts, MD  amLODipine (NORVASC) 10 MG tablet Take 1 tablet (10 mg total) by mouth daily. 01/27/20 04/26/20  Theora Gianotti, NP  busPIRone (BUSPAR) 5 MG tablet Take 1 tablet (5 mg total) by mouth 2 (two) times daily. 08/02/20   Steele Sizer, MD  cholecalciferol (VITAMIN D) 1000 UNITS tablet Take 2,000 Units by mouth daily.    [provider]  ciprofloxacin (CIPRO) 500 MG tablet Take 1 tablet (500 mg total) by mouth 2 (two) times daily for 10 days. 08/28/20 09/07/20  Johnn Hai, PA-C  Cranberry 500 MG TABS Take 2 tablets by mouth daily.    [provider]  doxazosin (CARDURA) 1 MG tablet TAKE 1 TABLET(1 MG) BY MOUTH TWICE DAILY Patient taking differently: Half the time takes once daily based on low BP 04/22/20   Gollan, Kathlene November, MD  ELIQUIS 5 MG TABS tablet TAKE 1 TABLET(5 MG) BY MOUTH TWICE DAILY 06/01/20   Steele Sizer, MD  furosemide (LASIX) 20 MG tablet Take 20 mg by mouth See admin instructions. Take 1 tablet (20mg ) by mouth every morning - take 1 additional tablet (20mg ) after lunch if needed for swelling    [provider]  levothyroxine (SYNTHROID) 137 MCG tablet TAKE 1 TABLET(137 MCG) BY MOUTH EVERY MORNING 05/26/20   Steele Sizer, MD  magic mouthwash w/lidocaine SOLN Take 5 mLs by mouth 4 (four) times daily as needed for mouth pain. 08/02/20   Steele Sizer, MD  metoprolol succinate (TOPROL-XL) 50 MG 24 hr tablet Take with or immediately following a meal. Patient not taking: Reported on 06/19/2020 01/12/20   Minna Merritts, MD  Multiple Vitamin (MULITIVITAMIN WITH MINERALS) TABS Take 1 tablet by mouth daily.    [provider]  potassium chloride (KLOR-CON) 10 MEQ tablet TAKE 1 TABLET BY MOUTH EVERY DAY AS DIRECTED. MAY TAKE AN EXTRA PILL AFTER LUNCH WITH FLUID PILL AS NEEDED FOR SWELLING 04/28/20    Gollan, Kathlene November, MD  rosuvastatin (CRESTOR) 40 MG tablet TAKE 1 TABLET(40 MG) BY MOUTH DAILY 06/16/20   Ancil Boozer, Drue Stager, MD  temazepam (RESTORIL) 15 MG capsule TAKE 1 CAPSULE(15 MG) BY MOUTH AT BEDTIME AS NEEDED FOR SLEEP Patient taking differently: About twice weekly 05/03/20   Delsa Grana, PA-C  valsartan (DIOVAN) 320 MG tablet TAKE 1 TABLET(320 MG) BY MOUTH DAILY Patient taking differently: Take 320 mg by mouth daily.  02/20/20   Minna Merritts, MD    Allergies Penicillins, Codeine, Erythromycin, and Septra [sulfamethoxazole-trimethoprim]  Family History  Problem Relation Age of Onset  . Alzheimer's disease Mother   . Stroke Father   . Breast cancer Father   . Heart disease Father        Pacemaker  . Breast cancer Paternal Aunt   . Kidney cancer Maternal Grandmother   . Alzheimer's disease Maternal Grandfather   . Thyroid disease Paternal Grandmother   . Stroke Paternal Grandfather     Social History Social  History   Tobacco Use  . Smoking status: Never Smoker  . Smokeless tobacco: Never Used  . Tobacco comment: smoking cessation materials not required  Vaping Use  . Vaping Use: Never used  Substance Use Topics  . Alcohol use: No  . Drug use: No    Review of Systems Constitutional: No fever/subjective chills Eyes: No visual changes. ENT: No sore throat. Cardiovascular: Denies chest pain. Respiratory: Denies shortness of breath. Gastrointestinal: No abdominal pain.  Positive nausea, no vomiting.   Genitourinary: Positive for dysuria and frequency. Musculoskeletal: Negative for back pain. Skin: Negative for rash. Neurological: Negative for headaches, focal weakness or numbness. ____________________________________________   PHYSICAL EXAM:  VITAL SIGNS: ED Triage Vitals  Enc Vitals Group     BP 08/28/20 1017 (!) 145/43     Pulse Rate 08/28/20 1017 60     Resp 08/28/20 1017 16     Temp 08/28/20 1017 98 F (36.7 C)     Temp Source 08/28/20 1017 Oral      SpO2 08/28/20 1017 100 %     Weight 08/28/20 1018 129 lb (58.5 kg)     Height 08/28/20 1018 5\' 6"  (1.676 m)     Head Circumference --      Peak Flow --      Pain Score 08/28/20 1018 0     Pain Loc --      Pain Edu? --      Excl. in [place]? --     Constitutional: Alert and oriented. Well appearing and in no acute distress. Eyes: Conjunctivae are normal.  Head: Atraumatic. Nose: No congestion/rhinnorhea. Neck: No stridor.   Cardiovascular: Normal rate, regular rhythm. Grossly normal heart sounds.  Good peripheral circulation. Respiratory: Normal respiratory effort.  No retractions. Lungs CTAB. Gastrointestinal: Soft and nontender. No distention. No abdominal bruits. No CVA tenderness. Musculoskeletal: No lower extremity tenderness nor edema.  No joint effusions. Neurologic:  Normal speech and language. No gross focal neurologic deficits are appreciated. No gait instability. Skin:  Skin is warm, dry and intact. No rash noted. Psychiatric: Mood and affect are normal. Speech and behavior are normal.  ____________________________________________   LABS (all labs ordered are listed, but only abnormal results are displayed)  Labs Reviewed  URINALYSIS, COMPLETE (UACMP) WITH MICROSCOPIC - Abnormal; Notable for the following components:      Result Value   Color, Urine YELLOW (*)    APPearance CLOUDY (*)    Hgb urine dipstick MODERATE (*)    Protein, ur 30 (*)    Leukocytes,Ua LARGE (*)    WBC, UA >50 (*)    All other components within normal limits  URINE CULTURE    PROCEDURES  Procedure(s) performed (including Critical Care):  Procedures   ____________________________________________   INITIAL IMPRESSION / ASSESSMENT AND PLAN / ED COURSE  As part of my medical decision making, I reviewed the following data within the electronic MEDICAL RECORD NUMBER Notes from prior ED visits and [place] Controlled Substance Database  79 year old female presents to the ED with sudden onset last  evening of frequency and dysuria.  She denies any fever, chills, nausea or vomiting.  She has had urinary tract infections in the past.  Urinalysis was consistent with a UTI.  Patient was given Cipro IV while in the ED and a prescription for the same was sent to her pharmacy to take for the next 5 days twice daily.  She is to follow-up with her PCP if any continued problems.  She is also  aware to return to the emergency department if any severe worsening of her symptoms such as fever, chills, nausea or vomiting or worsening of her symptoms.  ____________________________________________   FINAL CLINICAL IMPRESSION(S) / ED DIAGNOSES  Final diagnoses:  Acute urinary tract infection     ED Discharge Orders         Ordered    ciprofloxacin (CIPRO) 500 MG tablet  2 times daily        08/28/20 1301          *Please note:  [name] was evaluated in Emergency Department on 08/28/2020 for the symptoms described in the history of present illness. She was evaluated in the context of the global COVID-19 pandemic, which necessitated consideration that the patient might be at risk for infection with the SARS-CoV-2 virus that causes COVID-19. Institutional protocols and algorithms that pertain to the evaluation of patients at risk for COVID-19 are in a state of rapid change based on information released by regulatory bodies including the CDC and federal and state organizations. These policies and algorithms were followed during the patient's care in the ED.  Some ED evaluations and interventions may be delayed as a result of limited staffing during and the pandemic.*   Note:  This document was prepared using Dragon voice recognition software and may include unintentional dictation errors.    Johnn Hai, PA-C 08/28/20 1351    Lucrezia Starch, MD 08/28/20 1524

## 2020-08-30 LAB — URINE CULTURE: Culture: >=100000 — AB

## 2020-09-07 ENCOUNTER — Ambulatory Visit: Payer: Self-pay

## 2020-09-07 ENCOUNTER — Telehealth: Payer: Self-pay

## 2020-09-07 DIAGNOSIS — M47812 Spondylosis without myelopathy or radiculopathy, cervical region: Secondary | ICD-10-CM | POA: Diagnosis not present

## 2020-09-07 DIAGNOSIS — E785 Hyperlipidemia, unspecified: Secondary | ICD-10-CM

## 2020-09-07 DIAGNOSIS — I10 Essential (primary) hypertension: Secondary | ICD-10-CM

## 2020-09-07 NOTE — Chronic Care Management (AMB) (Signed)
Chronic Care Management Pharmacy  Name: [name]  MRN: [id number] DOB: 12-08-1940  Chief Complaint/ HPI  [name],  79 y.o. , female presents for their Follow-Up CCM visit with the clinical pharmacist via telephone.  PCP : Steele Sizer, MD  Their chronic conditions include: HTN, HLD, Afib  Office Visits: 08/02/20: Patient presented to Dr. Ancil Boozer for sore throat. Patient prescribed magic mouth wash.  6/11 Hosp d/c, [place], BP 160/62 P 70, off ASA s/p ablation, cardioversion 2019  Consult Visit: 08/28/20: Patient presented to ED for UTI. Patient discharged on ciprofloxacin 500 mg twice daily for 5 days.  8/18 hydrohephrosis, [place], BP 168/67 P 50 Wt 129 BMI 20.8,   Medications: Outpatient Encounter Medications as of 09/07/2020  Medication Sig  . metoprolol succinate (TOPROL-XL) 50 MG 24 hr tablet Take with or immediately following a meal.  . valsartan (DIOVAN) 320 MG tablet TAKE 1 TABLET(320 MG) BY MOUTH DAILY  . acetaminophen (TYLENOL) 650 MG CR tablet Take 650-1,300 mg by mouth every 8 (eight) hours as needed for pain.   Marland Kitchen amiodarone (PACERONE) 200 MG tablet TAKE 1 TABLET(200 MG) BY MOUTH DAILY  . amLODipine (NORVASC) 10 MG tablet Take 1 tablet (10 mg total) by mouth daily.  . busPIRone (BUSPAR) 5 MG tablet Take 1 tablet (5 mg total) by mouth 2 (two) times daily.  . cholecalciferol (VITAMIN D) 1000 UNITS tablet Take 2,000 Units by mouth daily.  . ciprofloxacin (CIPRO) 500 MG tablet Take 1 tablet (500 mg total) by mouth 2 (two) times daily for 10 days.  . Cranberry 500 MG TABS Take 2 tablets by mouth daily.  Marland Kitchen doxazosin (CARDURA) 1 MG tablet TAKE 1 TABLET(1 MG) BY MOUTH TWICE DAILY (Patient taking differently: Half the time takes once daily based on low BP)  . ELIQUIS 5 MG TABS tablet TAKE 1 TABLET(5 MG) BY MOUTH TWICE DAILY  . furosemide (LASIX) 20 MG tablet Take 20 mg by mouth See admin instructions. Take 1 tablet (68m) by mouth every morning - take 1  additional tablet (263m after lunch if needed for swelling  . levothyroxine (SYNTHROID) 137 MCG tablet TAKE 1 TABLET(137 MCG) BY MOUTH EVERY MORNING  . magic mouthwash w/lidocaine SOLN Take 5 mLs by mouth 4 (four) times daily as needed for mouth pain.  . Multiple Vitamin (MULITIVITAMIN WITH MINERALS) TABS Take 1 tablet by mouth daily.  . potassium chloride (KLOR-CON) 10 MEQ tablet TAKE 1 TABLET BY MOUTH EVERY DAY AS DIRECTED. MAY TAKE AN EXTRA PILL AFTER LUNCH WITH FLUID PILL AS NEEDED FOR SWELLING  . rosuvastatin (CRESTOR) 40 MG tablet TAKE 1 TABLET(40 MG) BY MOUTH DAILY  . temazepam (RESTORIL) 15 MG capsule TAKE 1 CAPSULE(15 MG) BY MOUTH AT BEDTIME AS NEEDED FOR SLEEP (Patient taking differently: About twice weekly)   No facility-administered encounter medications on file as of 09/07/2020.      Financial Resource Strain: Low Risk   . Difficulty of Paying Living Expenses: Not hard at all    Current Diagnosis/Assessment:  Goals Addressed            This Visit's Progress   . Chronic Care Management   On track    CARE PLAN ENTRY (see longitudinal plan of care for additional care plan information)  Current Barriers:  . Chronic Disease Management support, education, and care coordination needs related to Hypertension, Hyperlipidemia, and Atrial Fibrillation   Hypertension BP Readings from Last 3 Encounters:  06/09/20 (!) 168/67  05/25/20 (!) 130/50  04/21/20  129/61   . Pharmacist Clinical Goal(s): o Over the next 90 days, patient will work with PharmD and providers to maintain BP goal <140/90 . Current regimen:  . Doxazosin 3m twice daily, once daily if BP low . Diovan 3263mdaily . Amlodipine 1066maily . Interventions: o None . Patient self care activities - Over the next 90 days, patient will: o Check BP daily, document, and provide at future appointments o Ensure daily salt intake < 2300 mg/day  Hyperlipidemia Lab Results  Component Value Date/Time   LDLCALC 54  06/02/2019 08:36 AM   . Pharmacist Clinical Goal(s): o Over the next 90 days, patient will work with PharmD and providers to maintain LDL goal < 100 . Current regimen:  o Crestor 55m18mily . Interventions: o None . Patient self care activities - Over the next 90 days, patient will: o Continue current medication and lifestyle practices  Atrial Fibrillation . Pharmacist Clinical Goal(s) o Over the next 90 days, patient will work with PharmD and providers to maintain ideal blood (water) volume . Current regimen:  o Amiodarone 200mg59mly o Eliquis 5mg t46me daily . Interventions: o None . Patient self care activities - Over the next 90 days, patient will: o Notify cardiology if water weight becomes too high (swelling), or too low (dizziness)  Medication management . Pharmacist Clinical Goal(s): o Over the next 90 days, patient will work with PharmD and providers to maintain optimal medication adherence . Current pharmacy: [facility] . Interventions o Comprehensive medication review performed. o Continue current medication management strategy . Patient self care activities - Over the next 90 days, patient will: o Focus on medication adherence by continuing current practices o Take medications as prescribed o Report any questions or concerns to PharmD and/or provider(s)  Initial goal documentation       Hypertension   BP goal is:  <140/90  Office blood pressures are  BP Readings from Last 3 Encounters:  08/28/20 (!) 145/43  08/02/20 130/60  06/09/20 (!) 168/67   Lab Results  Component Value Date   CREATININE 0.77 03/31/2020   BUN 12 03/30/2020   GFRNONAA >60 03/31/2020   GFRAA >60 03/31/2020   NA 139 03/30/2020   K 3.8 03/30/2020   CALCIUM 8.8 (L) 03/30/2020   CO2 26 03/30/2020   Patient checks BP at home twice daily Patient home BP readings are ranging: 134/52    Patient has failed these meds in the past: metoprolol Patient is currently controlled on the  following medications:  . Doxazosin 1mg bi87mtakes once daily if BP low) . Valsartan 320mg da73m. Amlodipine 10mg dai53m Metoprolol XL 50 mg daily   We discussed: Patient has been making sure to drink a tall glass of water first thing in the AM, which has helped resolve much of her hypotension problems.   Home BP much better than office  Plan  Continue current medications   AFIB   Patient is currently rhythm controlled. HR 60 BPM  Patient has failed these meds in past: NA Patient is currently controlled on the following medications: . Amiodarone 200 mg daily . Eliquis 5 mg twice daily   We discussed: PAP Approved through [employer] through 2021. Patient will inform clinic when she enters donut hole again.   Plan  Continue current medications  Hyperlipidemia   LDL goal < 100  Lipid Panel     Component Value Date/Time   CHOL 128 06/02/2019 0836   CHOL 164 06/06/2016 0907  TRIG 86 06/02/2019 0836   HDL 57 06/02/2019 0836   HDL 54 06/06/2016 0907   LDLCALC 54 06/02/2019 0836    Hepatic Function Latest Ref Rng & Units 03/29/2020 06/02/2019 09/12/2018  Total Protein 6.5 - 8.1 g/dL 7.0 7.1 6.9  Albumin 3.5 - 5.0 g/dL 4.4 - -  AST 15 - 41 U/L 34 23 22  ALT 0 - 44 U/L 27 21 14   Alk Phosphatase 38 - 126 U/L 64 - -  Total Bilirubin 0.3 - 1.2 mg/dL 0.7 0.7 0.7  Bilirubin, Direct 0.1 - 0.5 mg/dL - - -     The ASCVD Risk score (Streetman., et al., 2013) failed to calculate for the following reasons:   The valid total cholesterol range is 130 to 320 mg/dL   Patient has failed these meds in past: NA Patient is currently controlled on the following medications:  . Rosuvastatin 13m daily  We discussed: At goal Denies myalgias  Plan  Continue current medications  Vaccines   Reviewed and discussed patient's vaccination history.    Immunization History  Administered Date(s) Administered  . Fluad Quad(high Dose 65+) 08/11/2019  . Influenza, High Dose  Seasonal PF 07/22/2015, 07/04/2018  . Influenza-Unspecified 08/25/2016, 08/13/2017  . PFIZER SARS-COV-2 Vaccination 11/13/2019, 12/04/2019  . Pneumococcal Conjugate-13 09/07/2016  . Pneumococcal Polysaccharide-23 12/04/2017    Plan  Recommended patient receive Shingrix, Covid Booster and influenza vaccine.   Medication Management   [facility] Uses pill box? Yes Pt endorses 100% compliance  Plan  Continue current medication management strategy  Follow up: 3 month phone visit  [phone]

## 2020-09-07 NOTE — Chronic Care Management (AMB) (Unsigned)
Chronic Care Management Pharmacy  Name: [name]  MRN: [id number] DOB: Jan 14, 1941  Chief Complaint/ HPI  [name],  79 y.o. , female presents for their Follow-Up CCM visit with the clinical pharmacist via telephone.  PCP : Steele Sizer, MD  Their chronic conditions include: HTN, HLD, Afib  Office Visits: 08/02/20: Patient presented to Dr. Ancil Boozer for sore throat. Patient prescribed magic mouth wash.  6/11 Hosp d/c, [place], BP 160/62 P 70, off ASA s/p ablation, cardioversion 2019  Consult Visit: 08/28/20: Patient presented to ED for UTI. Patient discharged on ciprofloxacin 500 mg twice daily for 10 days.  8/18 hydrohephrosis, [place], BP 168/67 P 50 Wt 129 BMI 20.8,   Medications: Outpatient Encounter Medications as of 09/07/2020  Medication Sig  . acetaminophen (TYLENOL) 650 MG CR tablet Take 650-1,300 mg by mouth every 8 (eight) hours as needed for pain.   Marland Kitchen amiodarone (PACERONE) 200 MG tablet TAKE 1 TABLET(200 MG) BY MOUTH DAILY  . amLODipine (NORVASC) 10 MG tablet Take 1 tablet (10 mg total) by mouth daily.  . busPIRone (BUSPAR) 5 MG tablet Take 1 tablet (5 mg total) by mouth 2 (two) times daily.  . cholecalciferol (VITAMIN D) 1000 UNITS tablet Take 2,000 Units by mouth daily.  . ciprofloxacin (CIPRO) 500 MG tablet Take 1 tablet (500 mg total) by mouth 2 (two) times daily for 10 days.  . Cranberry 500 MG TABS Take 2 tablets by mouth daily.  Marland Kitchen doxazosin (CARDURA) 1 MG tablet TAKE 1 TABLET(1 MG) BY MOUTH TWICE DAILY (Patient taking differently: Half the time takes once daily based on low BP)  . ELIQUIS 5 MG TABS tablet TAKE 1 TABLET(5 MG) BY MOUTH TWICE DAILY  . furosemide (LASIX) 20 MG tablet Take 20 mg by mouth See admin instructions. Take 1 tablet (23m) by mouth every morning - take 1 additional tablet (221m after lunch if needed for swelling  . levothyroxine (SYNTHROID) 137 MCG tablet TAKE 1 TABLET(137 MCG) BY MOUTH EVERY MORNING  . magic mouthwash  w/lidocaine SOLN Take 5 mLs by mouth 4 (four) times daily as needed for mouth pain.  . metoprolol succinate (TOPROL-XL) 50 MG 24 hr tablet Take with or immediately following a meal. (Patient not taking: Reported on 06/19/2020)  . Multiple Vitamin (MULITIVITAMIN WITH MINERALS) TABS Take 1 tablet by mouth daily.  . potassium chloride (KLOR-CON) 10 MEQ tablet TAKE 1 TABLET BY MOUTH EVERY DAY AS DIRECTED. MAY TAKE AN EXTRA PILL AFTER LUNCH WITH FLUID PILL AS NEEDED FOR SWELLING  . rosuvastatin (CRESTOR) 40 MG tablet TAKE 1 TABLET(40 MG) BY MOUTH DAILY  . temazepam (RESTORIL) 15 MG capsule TAKE 1 CAPSULE(15 MG) BY MOUTH AT BEDTIME AS NEEDED FOR SLEEP (Patient taking differently: About twice weekly)  . valsartan (DIOVAN) 320 MG tablet TAKE 1 TABLET(320 MG) BY MOUTH DAILY (Patient taking differently: Take 320 mg by mouth daily. )   No facility-administered encounter medications on file as of 09/07/2020.      Financial Resource Strain: Low Risk   . Difficulty of Paying Living Expenses: Not hard at all    Current Diagnosis/Assessment:  Goals Addressed   None    Hypertension   BP goal is:  <140/90  Office blood pressures are  BP Readings from Last 3 Encounters:  08/28/20 (!) 145/43  08/02/20 130/60  06/09/20 (!) 168/67   Lab Results  Component Value Date   CREATININE 0.77 03/31/2020   BUN 12 03/30/2020   GFRNONAA >60 03/31/2020   GFRAA >60  03/31/2020   NA 139 03/30/2020   K 3.8 03/30/2020   CALCIUM 8.8 (L) 03/30/2020   CO2 26 03/30/2020     Patient checks BP at home twice daily Patient home BP readings are ranging: 130/50,   Patient has failed these meds in the past: metoprolol Patient is currently controlled on the following medications:  . Doxazosin 57m bid, once daily if BP low . Valsartan 3226mdaily . Amlodipine 1016maily  Metoprolol?   We discussed:  Home BP much better than office  Plan  Continue current medications   AFIB   Patient is currently rhythm  controlled. HR 60 BPM  Patient has failed these meds in past: NA Patient is currently controlled on the following medications: . Amiodarone 200 mg daily . Eliquis 5 mg twice daily   We discussed:  Plan  Continue current medications  Hyperlipidemia   LDL goal < 100  Lipid Panel     Component Value Date/Time   CHOL 128 06/02/2019 0836   CHOL 164 06/06/2016 0907   TRIG 86 06/02/2019 0836   HDL 57 06/02/2019 0836   HDL 54 06/06/2016 0907   LDLCALC 54 06/02/2019 0836    Hepatic Function Latest Ref Rng & Units 03/29/2020 06/02/2019 09/12/2018  Total Protein 6.5 - 8.1 g/dL 7.0 7.1 6.9  Albumin 3.5 - 5.0 g/dL 4.4 - -  AST 15 - 41 U/L 34 23 22  ALT 0 - 44 U/L _0 Alk Phosphatase 38 - 126 U/L 64 - -  Total Bilirubin 0.3 - 1.2 mg/dL 0.7 0.7 0.7  Bilirubin, Direct 0.1 - 0.5 mg/dL - - -     The ASCVD Risk score (GoMikey Bussing Jr., et al., 2013) failed to calculate for the following reasons:   The valid total cholesterol range is 130 to 320 mg/dL   Patient has failed these meds in past: NA Patient is currently controlled on the following medications:  . Rosuvastatin 49m26mily  We discussed: At goal Denies myalgias  Plan  Continue current medications  Vaccines   Reviewed and discussed patient's vaccination history.    Immunization History  Administered Date(s) Administered  . Fluad Quad(high Dose 65+) 08/11/2019  . Influenza, High Dose Seasonal PF 07/22/2015, 07/04/2018  . Influenza-Unspecified 08/25/2016, 08/13/2017  . PFIZER SARS-COV-2 Vaccination 11/13/2019, 12/04/2019  . Pneumococcal Conjugate-13 09/07/2016  . Pneumococcal Polysaccharide-23 12/04/2017    Plan  Recommended patient receive Shingrix vaccine.   Medication Management   [facility] medications Uses pill box? Yes Pt endorses 100% compliance  We discussed:    Plan  Continue current medication management strategy  Follow up: 3 month phone visit  [phone]

## 2020-09-09 ENCOUNTER — Telehealth: Payer: Self-pay

## 2020-09-13 ENCOUNTER — Ambulatory Visit (INDEPENDENT_AMBULATORY_CARE_PROVIDER_SITE_OTHER): Payer: Medicare Other | Admitting: Family Medicine

## 2020-09-13 ENCOUNTER — Other Ambulatory Visit: Payer: Self-pay

## 2020-09-13 ENCOUNTER — Encounter: Payer: Self-pay | Admitting: Family Medicine

## 2020-09-13 VITALS — BP 130/68 | HR 76 | Temp 97.6°F | Resp 16 | Ht 66.0 in | Wt 133.5 lb

## 2020-09-13 DIAGNOSIS — N1831 Chronic kidney disease, stage 3a: Secondary | ICD-10-CM | POA: Diagnosis not present

## 2020-09-13 DIAGNOSIS — R739 Hyperglycemia, unspecified: Secondary | ICD-10-CM

## 2020-09-13 DIAGNOSIS — F411 Generalized anxiety disorder: Secondary | ICD-10-CM

## 2020-09-13 DIAGNOSIS — N390 Urinary tract infection, site not specified: Secondary | ICD-10-CM | POA: Diagnosis not present

## 2020-09-13 DIAGNOSIS — D692 Other nonthrombocytopenic purpura: Secondary | ICD-10-CM | POA: Diagnosis not present

## 2020-09-13 DIAGNOSIS — F409 Phobic anxiety disorder, unspecified: Secondary | ICD-10-CM

## 2020-09-13 DIAGNOSIS — E039 Hypothyroidism, unspecified: Secondary | ICD-10-CM

## 2020-09-13 DIAGNOSIS — E785 Hyperlipidemia, unspecified: Secondary | ICD-10-CM | POA: Diagnosis not present

## 2020-09-13 DIAGNOSIS — I471 Supraventricular tachycardia, unspecified: Secondary | ICD-10-CM

## 2020-09-13 DIAGNOSIS — I5042 Chronic combined systolic (congestive) and diastolic (congestive) heart failure: Secondary | ICD-10-CM

## 2020-09-13 DIAGNOSIS — I482 Chronic atrial fibrillation, unspecified: Secondary | ICD-10-CM

## 2020-09-13 DIAGNOSIS — Z1159 Encounter for screening for other viral diseases: Secondary | ICD-10-CM

## 2020-09-13 DIAGNOSIS — I272 Pulmonary hypertension, unspecified: Secondary | ICD-10-CM

## 2020-09-13 DIAGNOSIS — F5105 Insomnia due to other mental disorder: Secondary | ICD-10-CM

## 2020-09-13 DIAGNOSIS — I10 Essential (primary) hypertension: Secondary | ICD-10-CM | POA: Diagnosis not present

## 2020-09-13 MED ORDER — TEMAZEPAM 15 MG PO CAPS: ORAL_CAPSULE | ORAL | 0 refills | Status: DC

## 2020-09-13 NOTE — Progress Notes (Signed)
Name: [name]   MRN: [id number]    DOB: 1940/11/04   Date:09/13/2020       Progress Note  Subjective  Chief Complaint  Follow up  HPI   GAD: doing well on Buspar twice daily, she has been taking for many years and no side effects , she also takes temazepam qhs prn and it helps her sleep,she states taking Temazepam occasionally only for sleep a couple of times a week only She denies feeling nervous, no history of panic attacks. She needs a refill of Temazepam to take prn   S/p neck fusion: it was done by Dr. Larose Hires April 2021 and is doing well, still has some pain, but radiculitis down left arm has resolved, she has noticed some balance problems since the surgery and neurosurgeon gave her reassurance but if no improvement by the end of the year she will PT , she states it is gradually improvement   Carotid calcification: found on x-ray of neck. She also had a vascular study done back in 2011 that showed mild plaque formationon right.Sheis now on Crestor and Eliquis. Off aspirin since ablation 04/2018. She denies headache or dizzinessor double vision,shestillhasintermittentbalance problems - but states secondary to visual disturbance ( she can see better from left eye than right), Dr. Larose Hires thinks balanced disturbance is likely from neck surgery. Marland Kitchen She went back to Dr. Erven Colla and had ABI that was normal, carotid doppler was due in 2021, but Dr. Rockey Situ told her not to worry about doing it at this time. She also has a history of pulmonary hypertension - under the care of Dr. Rockey Situ   HTN: bp at [facility] usually controlled ,130's/50-60's  No chest pain but has occasional palpitation has been less often usually once a week. She denies dizziness   Afib: she had a cardioversion in June2019and another one in July2019,she has intermittent palpitation still about once a week,butdenies associated chest pain episodes lasts less than one minuterate controlled with amiodarone  and metoprolol. Denies side effects  CHF chronic: she is down to one pillow at night.She has mild sob with moderate activity- like when doing yard work, no chest pain,she has intermittent lower extremity edema and is taking extra lasix and potassium at lunch prn as instructed by Dr. Rockey Situ .On ARB , beta blocker and Eliquis. She has follow up with Dr. Rockey Situ January   Senile Purpura: both arms and legs, on eliquis, unchanged   Skin cancer: seeing dermatologist - Dr. 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 Coli, treated with Cipro for 5 days, taking cranberry pills, avoiding caffeine and drinking water, seen by Urologist and tests were negative She had a renal US in June that showed right hydronephrosis and repeat US showed improvement . She had two diagnosed UTI's , first in May, second in June that required hospital stay, 3rd in Nov that was treated as outpatient. She denies any symptoms at this time and we will send urine for culture   Patient Active Problem List   Diagnosis Date Noted  . Pyelonephritis 03/30/2020  . Acute pyelonephritis 03/29/2020  . Hydronephrosis of right kidney 03/29/2020  . Depression 03/29/2020  . Chronic heart failure ([place]) 05/24/2018  . Atrial fibrillation ([place]) 05/24/2018  . CKD (chronic kidney disease), stage III ([place]) 05/17/2018  .  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 Shortness of breath 05/07/2018  . Pulmonary hypertension, unspecified ([place]) 04/05/2018  . Chronic combined systolic (congestive) and diastolic (congestive) heart failure ([place]) 04/05/2018  . Paroxysmal sinus tachycardia ([place]) 01/29/2018  . Leg pain 01/04/2018  . PAD (peripheral artery disease) ([place]) 01/04/2018  . Carotid artery calcification,  bilateral 12/19/2017  . Cervical radiculitis 12/19/2017  . Hyperglycemia 03/06/2016  . Hypothyroid 04/21/2015  . 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 Anxiety 04/21/2015  . Hyperlipidemia 04/21/2015  . Diuretic-induced hypokalemia 04/21/2015  . Palpitations 12/18/2012  . Hypertension 05/27/2012    Past Surgical History:  Procedure Laterality Date  . BACK SURGERY  07/2019  . CARDIOVERSION N/A 03/26/2018   Procedure: CARDIOVERSION;  Surgeon: Nelva Bush, MD;  Location: [facility] ORS;  Service: Cardiovascular;  Laterality: N/A;  . CARDIOVERSION N/A 04/24/2018   Procedure: CARDIOVERSION;  Surgeon: Minna Merritts, MD;  Location: [facility] ORS;  Service: Cardiovascular;  Laterality: N/A;  . ELECTROPHYSIOLOGY STUDY N/A 02/27/2012   Procedure: ELECTROPHYSIOLOGY STUDY;  Surgeon: Evans Lance, MD;  Location[facility] CATH LAB;  Service: Cardiovascular;  Laterality: N/A;  . EPS and ablation for SVT  5/13   slow pathway ablation by Dr Lovena Le  . EYE SURGERY     surgery for detatched retina  . NECK SURGERY  02/2020  . SUPRAVENTRICULAR TACHYCARDIA ABLATION N/A 02/27/2012   Procedure: SUPRAVENTRICULAR TACHYCARDIA ABLATION;  Surgeon: Evans Lance, MD;  Location: [facility] CATH LAB;  Service: Cardiovascular;  Laterality: N/A;    Family History  Problem Relation Age of Onset  . Alzheimer's disease Mother   . Stroke Father   . Breast cancer Father   . Heart disease Father        Pacemaker  . Breast cancer Paternal Aunt   . Kidney cancer Maternal Grandmother   . Alzheimer's disease Maternal Grandfather   . Thyroid disease Paternal Grandmother   . Stroke Paternal Grandfather     Social History   Tobacco Use  . Smoking status: Never Smoker  . Smokeless tobacco: Never Used  . Tobacco comment: smoking cessation materials not required  Substance Use Topics  . Alcohol use: No     Current Outpatient Medications:  .  acetaminophen (TYLENOL) 650 MG CR tablet, Take 650-1,300 mg by  mouth every 8 (eight) hours as needed for pain. , Disp: , Rfl:  .  amiodarone (PACERONE) 200 MG tablet, TAKE 1 TABLET(200 MG) BY MOUTH DAILY, Disp: 90 tablet, Rfl: 0 .  busPIRone (BUSPAR) 5 MG tablet, Take 1 tablet (5 mg total) by mouth 2 (two) times daily., Disp: 180 tablet, Rfl: 1 .  cholecalciferol (VITAMIN D) 1000 UNITS tablet, Take 2,000 Units by mouth daily., Disp: , Rfl:  .  Cranberry 500 MG TABS, Take 2 tablets by mouth daily., Disp: , Rfl:  .  doxazosin (CARDURA) 1 MG tablet, TAKE 1 TABLET(1 MG) BY MOUTH TWICE DAILY (Patient taking differently: Half the time takes once daily based on low BP), Disp: 180 tablet, Rfl: 2 .  ELIQUIS 5 MG TABS tablet, TAKE 1 TABLET(5 MG) BY MOUTH TWICE DAILY, Disp: 60 tablet, Rfl: 5 .  furosemide (LASIX) 20 MG tablet, Take 20 mg by mouth See admin instructions. Take 1 tablet (20mg ) by mouth every morning - take 1 additional tablet (20mg ) after lunch if needed for swelling, Disp: , Rfl:  .  levothyroxine (SYNTHROID) 137 MCG tablet, TAKE 1 TABLET(137 MCG) BY MOUTH EVERY MORNING, Disp: 90 tablet, Rfl: 1 .  metoprolol succinate (TOPROL-XL) 50 MG 24 hr tablet, Take with or immediately following a meal., Disp: 90 tablet, Rfl: 0 .  Multiple Vitamin (MULITIVITAMIN WITH MINERALS) TABS, Take 1 tablet by mouth daily., Disp: , Rfl:  .  potassium chloride (KLOR-CON) 10 MEQ tablet, TAKE 1 TABLET BY MOUTH EVERY DAY AS DIRECTED. MAY TAKE AN EXTRA PILL AFTER LUNCH WITH FLUID PILL AS NEEDED FOR SWELLING, Disp: 135 tablet, Rfl: 2 .  rosuvastatin (CRESTOR) 40 MG tablet, TAKE 1 TABLET(40 MG) BY MOUTH DAILY, Disp: 90 tablet, Rfl: 1 .  temazepam (RESTORIL) 15 MG capsule, TAKE 1 CAPSULE(15 MG) BY MOUTH AT BEDTIME AS NEEDED FOR SLEEP (Patient taking differently: About twice weekly), Disp: 30 capsule, Rfl: 0 .  valsartan (DIOVAN) 320 MG tablet, TAKE 1 TABLET(320 MG) BY MOUTH DAILY, Disp: 90 tablet, Rfl: 3 .  amLODipine (NORVASC) 10 MG tablet, Take 1 tablet (10 mg total) by mouth daily.,  Disp: 90 tablet, Rfl: 3  Allergies  Allergen Reactions  . Penicillins Swelling and Rash    Has patient had a PCN reaction causing immediate rash, facial/tongue/throat swelling, SOB or lightheadedness with hypotension: Yes Has patient had a PCN reaction causing severe rash involving mucus membranes or skin necrosis: No Has patient had a PCN reaction that required hospitalization: No Has patient had a PCN reaction occurring within the last 10 years: No If all of the above answers are "NO", then may proceed with Cephalosporin use.   . Codeine Nausea And Vomiting  . Erythromycin Itching and Swelling  . Septra [Sulfamethoxazole-Trimethoprim] Swelling    I personally reviewed active problem list, medication list, allergies, family history, social history, health maintenance with the patient/caregiver today.   ROS  Constitutional: Negative for fever or weight change.  Respiratory: Negative for cough but has intermittent  shortness of breath.   Cardiovascular: Negative for chest pain , but has intermittent palpitations.  Gastrointestinal: Negative for abdominal pain, no bowel changes.  Musculoskeletal: Negative for gait problem or joint swelling.  Skin: Negative for rash.  Neurological: Negative for dizziness or headache.  No other specific complaints in a complete review of systems (except as listed in HPI above).  Objective  Vitals:   09/13/20 1118  BP: 130/68  Pulse: 76  Resp: 16  Temp: 97.6 F (36.4 C)  TempSrc: Oral  SpO2: 96%  Weight: 133 lb 8 oz (60.6 kg)  Height: 5\' 6"  (1.676 m)    Body mass index is 21.55 kg/m.  Physical Exam  Constitutional: Patient appears well-developed and well-nourished. No distress.  HEENT: head atraumatic, normocephalic, pupils equal and reactive to light, neck supple Cardiovascular: Normal rate, irregular rhythm and normal heart sounds.  No murmur heard. No BLE edema. Pulmonary/Chest: Effort normal and breath sounds normal. No respiratory  distress. Abdominal: Soft.  There is no tenderness. Psychiatric: Patient has a normal mood and affect. behavior is normal. Judgment and thought content normal.  Recent Results (from the past 2160 hour(s))  Urinalysis, Complete w Microscopic Urine, Clean Catch     Status: Abnormal   Collection Time: 08/28/20 11:01 AM  Result Value Ref Range   Color, Urine YELLOW (A) YELLOW   APPearance CLOUDY (A) CLEAR   Specific Gravity, Urine 1.009 1.005 - 1.030   pH 5.0 5.0 - 8.0   Glucose, UA NEGATIVE NEGATIVE mg/dL   Hgb urine dipstick MODERATE (A) NEGATIVE   Bilirubin Urine NEGATIVE NEGATIVE   Ketones, ur NEGATIVE NEGATIVE mg/dL   Protein, ur 30 (A) NEGATIVE mg/dL   Nitrite NEGATIVE  NEGATIVE   Leukocytes,Ua LARGE (A) NEGATIVE   RBC / HPF 21-50 0 - 5 RBC/hpf   WBC, UA >50 (H) 0 - 5 WBC/hpf   Bacteria, UA NONE SEEN NONE SEEN   Squamous Epithelial / LPF 0-5 0 - 5   WBC Clumps PRESENT    Mucus PRESENT    Hyaline Casts, UA PRESENT    Amorphous Crystal PRESENT     Comment: Performed at [facility], [address], [place], [place] [address]  Urine culture     Status: Abnormal   Collection Time: 08/28/20 11:01 AM   Specimen: Urine, Random  Result Value Ref Range   Specimen Description      URINE, RANDOM Performed at [facility], [address] [place], [place] [address]    Special Requests      NONE Performed at [facility], [address] [place],  [address]    Culture >=100,000 COLONIES/mL ESCHERICHIA COLI (A)    Report Status 08/30/2020 FINAL    Organism ID, Bacteria ESCHERICHIA COLI (A)       Susceptibility   Escherichia coli - MIC*    AMPICILLIN >=32 RESISTANT Resistant     CEFAZOLIN <=4 SENSITIVE Sensitive     CEFEPIME <=0.12 SENSITIVE Sensitive     CEFTRIAXONE <=0.25 SENSITIVE Sensitive     CIPROFLOXACIN <=0.25 SENSITIVE Sensitive     GENTAMICIN <=1 SENSITIVE Sensitive     IMIPENEM <=0.25 SENSITIVE Sensitive     NITROFURANTOIN <=16  SENSITIVE Sensitive     TRIMETH/SULFA <=20 SENSITIVE Sensitive     AMPICILLIN/SULBACTAM 16 INTERMEDIATE Intermediate     PIP/TAZO <=4 SENSITIVE Sensitive     * >=100,000 COLONIES/mL ESCHERICHIA COLI     PHQ2/9: Depression screen [facility] 2/9 09/13/2020 09/13/2020 08/02/2020 05/25/2020 04/02/2020  Decreased Interest 0 0 0 0 0  Down, Depressed, Hopeless 0 0 0 0 0  PHQ - 2 Score 0 0 0 0 0  Altered sleeping 0 - - - 0  Tired, decreased energy 1 - - - 0  Change in appetite 0 - - - 0  Feeling bad or failure about yourself  0 - - - 0  Trouble concentrating 0 - - - 0  Moving slowly or fidgety/restless 0 - - - 0  Suicidal thoughts 0 - - - 0  PHQ-9 Score 1 - - - 0  Difficult doing work/chores - - - - -  Some recent data might be hidden    phq 9 is negative   Fall Risk: Fall Risk  09/13/2020 08/02/2020 05/25/2020 04/02/2020 02/02/2020  Falls in the past year? 0 0 0 0 0  Number falls in past yr: 0 0 0 0 0  Injury with Fall? 0 0 0 0 0  Comment - - - - -  Risk for fall due to : - - No Fall Risks - -  Risk for fall due to: Comment - - - - -  Follow up - - Falls prevention discussed - -     Functional Status Survey: Is the patient deaf or have difficulty hearing?: No Does the patient have difficulty seeing, even when wearing glasses/contacts?: No Does the patient have difficulty concentrating, remembering, or making decisions?: No Does the patient have difficulty walking or climbing stairs?: No Does the patient have difficulty dressing or bathing?: No Does the patient have difficulty doing errands alone such as visiting a doctor's office or shopping?: No    Assessment & Plan  1. GAD (generalized anxiety  disorder)   2. Senile purpura (HCC)  stable  3. Stage 3a chronic kidney disease (HCC)  We will recheck labs  4. SVT (supraventricular tachycardia) ([place])   5. Chronic a-fib ([place])   6. Essential hypertension  - COMPLETE METABOLIC PANEL WITH GFR - CBC with  Differential/Platelet  7. Hyperglycemia  - Hemoglobin A1c  8. Chronic combined systolic (congestive) and diastolic (congestive) heart failure ([place])   9. Primary hypertension   10. Pulmonary hypertension ([place])   11. Dyslipidemia  - Lipid panel  12. Recurrent UTI  - CULTURE, URINE COMPREHENSIVE  13. Hypothyroidism in adult  - TSH  14. Need for hepatitis C screening test  - Hepatitis C antibody  15. Insomnia due to anxiety and fear  - temazepam (RESTORIL) 15 MG capsule; TAKE 1 CAPSULE(15 MG) BY MOUTH AT BEDTIME AS NEEDED FOR SLEEP  Dispense: 30 capsule; Refill: 0

## 2020-09-15 LAB — COMPLETE METABOLIC PANEL WITH GFR
AG Ratio: 1.9 (calc) (ref 1.0–2.5)
ALT: 23 U/L (ref 6–29)
AST: 27 U/L (ref 10–35)
Albumin: 4.8 g/dL (ref 3.6–5.1)
Alkaline phosphatase (APISO): 81 U/L (ref 37–153)
BUN/Creatinine Ratio: 15 (calc) (ref 6–22)
BUN: 17 mg/dL (ref 7–25)
CO2: 25 mmol/L (ref 20–32)
Calcium: 9.8 mg/dL (ref 8.6–10.4)
Chloride: 104 mmol/L (ref 98–110)
Creat: 1.14 mg/dL — ABNORMAL HIGH (ref 0.60–0.93)
GFR, Est African American: 53 mL/min/{1.73_m2} — ABNORMAL LOW (ref >=60)
GFR, Est Non African American: 46 mL/min/{1.73_m2} — ABNORMAL LOW (ref >=60)
Globulin: 2.5 g/dL (calc) (ref 1.9–3.7)
Glucose, Bld: 100 mg/dL — ABNORMAL HIGH (ref 65–99)
Potassium: 5 mmol/L (ref 3.5–5.3)
Sodium: 142 mmol/L (ref 135–146)
Total Bilirubin: 0.6 mg/dL (ref 0.2–1.2)
Total Protein: 7.3 g/dL (ref 6.1–8.1)

## 2020-09-15 LAB — LIPID PANEL
Cholesterol: 134 mg/dL (ref <200)
HDL: 56 mg/dL (ref >=50)
LDL Cholesterol (Calc): 53 mg/dL (calc)
Non-HDL Cholesterol (Calc): 78 mg/dL (calc) (ref <130)
Total CHOL/HDL Ratio: 2.4 (calc) (ref <5.0)
Triglycerides: 174 mg/dL — ABNORMAL HIGH (ref <150)

## 2020-09-15 LAB — HEMOGLOBIN A1C
Hgb A1c MFr Bld: 5.2 % of total Hgb (ref <5.7)
Mean Plasma Glucose: 103 (calc)
eAG (mmol/L): 5.7 (calc)

## 2020-09-15 LAB — HEPATITIS C ANTIBODY
Hepatitis C Ab: NONREACTIVE
SIGNAL TO CUT-OFF: 0.01 (ref <1.00)

## 2020-09-15 LAB — CBC WITH DIFFERENTIAL/PLATELET
Absolute Monocytes: 435 cells/uL (ref 200–950)
Basophils Absolute: 27 cells/uL (ref 0–200)
Basophils Relative: 0.4 %
Eosinophils Absolute: 82 cells/uL (ref 15–500)
Eosinophils Relative: 1.2 %
HCT: 38 % (ref 35.0–45.0)
Hemoglobin: 12.6 g/dL (ref 11.7–15.5)
Lymphs Abs: 1265 cells/uL (ref 850–3900)
MCH: 31.6 pg (ref 27.0–33.0)
MCHC: 33.2 g/dL (ref 32.0–36.0)
MCV: 95.2 fL (ref 80.0–100.0)
MPV: 9.1 fL (ref 7.5–12.5)
Monocytes Relative: 6.4 %
Neutro Abs: 4991 cells/uL (ref 1500–7800)
Neutrophils Relative %: 73.4 %
Platelets: 271 10*3/uL (ref 140–400)
RBC: 3.99 10*6/uL (ref 3.80–5.10)
RDW: 13.1 % (ref 11.0–15.0)
Total Lymphocyte: 18.6 %
WBC: 6.8 10*3/uL (ref 3.8–10.8)

## 2020-09-15 LAB — CULTURE, URINE COMPREHENSIVE
MICRO NUMBER:: 11234648
RESULT:: NO GROWTH
SPECIMEN QUALITY:: ADEQUATE

## 2020-09-15 LAB — TSH: TSH: 5.09 mIU/L — ABNORMAL HIGH (ref 0.40–4.50)

## 2020-09-20 DIAGNOSIS — C44719 Basal cell carcinoma of skin of left lower limb, including hip: Secondary | ICD-10-CM | POA: Diagnosis not present

## 2020-09-20 DIAGNOSIS — C44519 Basal cell carcinoma of skin of other part of trunk: Secondary | ICD-10-CM | POA: Diagnosis not present

## 2020-09-23 ENCOUNTER — Other Ambulatory Visit: Payer: Self-pay | Admitting: Cardiovascular Disease

## 2020-10-23 DIAGNOSIS — Z8701 Personal history of pneumonia (recurrent): Secondary | ICD-10-CM | POA: Insufficient documentation

## 2020-10-30 ENCOUNTER — Emergency Department: Payer: Medicare Other

## 2020-10-30 ENCOUNTER — Encounter: Payer: Self-pay | Admitting: Obstetrics and Gynecology

## 2020-10-30 ENCOUNTER — Inpatient Hospital Stay
Admission: EM | Admit: 2020-10-30 | Discharge: 2020-11-02 | DRG: 871 | Disposition: A | Discharge status: Home / Self Care | Payer: Medicare Other | Attending: Internal Medicine | Admitting: Internal Medicine

## 2020-10-30 ENCOUNTER — Other Ambulatory Visit: Payer: Self-pay

## 2020-10-30 ENCOUNTER — Inpatient Hospital Stay: Payer: Medicare Other

## 2020-10-30 DIAGNOSIS — I5032 Chronic diastolic (congestive) heart failure: Secondary | ICD-10-CM

## 2020-10-30 DIAGNOSIS — Z7901 Long term (current) use of anticoagulants: Secondary | ICD-10-CM

## 2020-10-30 DIAGNOSIS — Z20822 Contact with and (suspected) exposure to covid-19: Secondary | ICD-10-CM | POA: Diagnosis present

## 2020-10-30 DIAGNOSIS — R6520 Severe sepsis without septic shock: Secondary | ICD-10-CM | POA: Diagnosis present

## 2020-10-30 DIAGNOSIS — I447 Left bundle-branch block, unspecified: Secondary | ICD-10-CM | POA: Diagnosis present

## 2020-10-30 DIAGNOSIS — A419 Sepsis, unspecified organism: Secondary | ICD-10-CM | POA: Diagnosis not present

## 2020-10-30 DIAGNOSIS — J9600 Acute respiratory failure, unspecified whether with hypoxia or hypercapnia: Secondary | ICD-10-CM

## 2020-10-30 DIAGNOSIS — I480 Paroxysmal atrial fibrillation: Secondary | ICD-10-CM

## 2020-10-30 DIAGNOSIS — E039 Hypothyroidism, unspecified: Secondary | ICD-10-CM | POA: Diagnosis present

## 2020-10-30 DIAGNOSIS — I5042 Chronic combined systolic (congestive) and diastolic (congestive) heart failure: Secondary | ICD-10-CM

## 2020-10-30 DIAGNOSIS — R6889 Other general symptoms and signs: Secondary | ICD-10-CM | POA: Diagnosis not present

## 2020-10-30 DIAGNOSIS — I4819 Other persistent atrial fibrillation: Secondary | ICD-10-CM | POA: Diagnosis present

## 2020-10-30 DIAGNOSIS — Z79890 Hormone replacement therapy (postmenopausal): Secondary | ICD-10-CM

## 2020-10-30 DIAGNOSIS — R000 Tachycardia, unspecified: Secondary | ICD-10-CM | POA: Diagnosis present

## 2020-10-30 DIAGNOSIS — J189 Pneumonia, unspecified organism: Secondary | ICD-10-CM | POA: Diagnosis present

## 2020-10-30 DIAGNOSIS — Z882 Allergy status to sulfonamides status: Secondary | ICD-10-CM | POA: Diagnosis not present

## 2020-10-30 DIAGNOSIS — J80 Acute respiratory distress syndrome: Secondary | ICD-10-CM | POA: Diagnosis not present

## 2020-10-30 DIAGNOSIS — F419 Anxiety disorder, unspecified: Secondary | ICD-10-CM | POA: Diagnosis present

## 2020-10-30 DIAGNOSIS — D6859 Other primary thrombophilia: Secondary | ICD-10-CM | POA: Diagnosis not present

## 2020-10-30 DIAGNOSIS — F32A Depression, unspecified: Secondary | ICD-10-CM | POA: Diagnosis present

## 2020-10-30 DIAGNOSIS — Z820 Family history of epilepsy and other diseases of the nervous system: Secondary | ICD-10-CM

## 2020-10-30 DIAGNOSIS — Z885 Allergy status to narcotic agent status: Secondary | ICD-10-CM | POA: Diagnosis not present

## 2020-10-30 DIAGNOSIS — R0689 Other abnormalities of breathing: Secondary | ICD-10-CM | POA: Diagnosis not present

## 2020-10-30 DIAGNOSIS — E785 Hyperlipidemia, unspecified: Secondary | ICD-10-CM | POA: Diagnosis not present

## 2020-10-30 DIAGNOSIS — I10 Essential (primary) hypertension: Secondary | ICD-10-CM | POA: Diagnosis present

## 2020-10-30 DIAGNOSIS — I517 Cardiomegaly: Secondary | ICD-10-CM | POA: Diagnosis not present

## 2020-10-30 DIAGNOSIS — R0602 Shortness of breath: Secondary | ICD-10-CM | POA: Diagnosis not present

## 2020-10-30 DIAGNOSIS — E876 Hypokalemia: Secondary | ICD-10-CM | POA: Diagnosis not present

## 2020-10-30 DIAGNOSIS — R0682 Tachypnea, not elsewhere classified: Secondary | ICD-10-CM | POA: Diagnosis not present

## 2020-10-30 DIAGNOSIS — I272 Pulmonary hypertension, unspecified: Secondary | ICD-10-CM | POA: Diagnosis present

## 2020-10-30 DIAGNOSIS — Z8051 Family history of malignant neoplasm of kidney: Secondary | ICD-10-CM

## 2020-10-30 DIAGNOSIS — I130 Hypertensive heart and chronic kidney disease with heart failure and stage 1 through stage 4 chronic kidney disease, or unspecified chronic kidney disease: Secondary | ICD-10-CM | POA: Diagnosis not present

## 2020-10-30 DIAGNOSIS — Z66 Do not resuscitate: Secondary | ICD-10-CM | POA: Diagnosis not present

## 2020-10-30 DIAGNOSIS — K449 Diaphragmatic hernia without obstruction or gangrene: Secondary | ICD-10-CM | POA: Diagnosis not present

## 2020-10-30 DIAGNOSIS — Z880 Allergy status to penicillin: Secondary | ICD-10-CM

## 2020-10-30 DIAGNOSIS — I340 Nonrheumatic mitral (valve) insufficiency: Secondary | ICD-10-CM | POA: Diagnosis not present

## 2020-10-30 DIAGNOSIS — Z8249 Family history of ischemic heart disease and other diseases of the circulatory system: Secondary | ICD-10-CM

## 2020-10-30 DIAGNOSIS — D649 Anemia, unspecified: Secondary | ICD-10-CM | POA: Diagnosis present

## 2020-10-30 DIAGNOSIS — D6869 Other thrombophilia: Secondary | ICD-10-CM

## 2020-10-30 DIAGNOSIS — I2510 Atherosclerotic heart disease of native coronary artery without angina pectoris: Secondary | ICD-10-CM | POA: Diagnosis not present

## 2020-10-30 DIAGNOSIS — I361 Nonrheumatic tricuspid (valve) insufficiency: Secondary | ICD-10-CM | POA: Diagnosis not present

## 2020-10-30 DIAGNOSIS — R509 Fever, unspecified: Secondary | ICD-10-CM | POA: Diagnosis not present

## 2020-10-30 DIAGNOSIS — Z823 Family history of stroke: Secondary | ICD-10-CM

## 2020-10-30 DIAGNOSIS — J9601 Acute respiratory failure with hypoxia: Secondary | ICD-10-CM

## 2020-10-30 DIAGNOSIS — J188 Other pneumonia, unspecified organism: Secondary | ICD-10-CM

## 2020-10-30 DIAGNOSIS — I5043 Acute on chronic combined systolic (congestive) and diastolic (congestive) heart failure: Secondary | ICD-10-CM

## 2020-10-30 DIAGNOSIS — Z9109 Other allergy status, other than to drugs and biological substances: Secondary | ICD-10-CM

## 2020-10-30 DIAGNOSIS — Z79899 Other long term (current) drug therapy: Secondary | ICD-10-CM

## 2020-10-30 DIAGNOSIS — J90 Pleural effusion, not elsewhere classified: Secondary | ICD-10-CM | POA: Diagnosis not present

## 2020-10-30 DIAGNOSIS — Z803 Family history of malignant neoplasm of breast: Secondary | ICD-10-CM

## 2020-10-30 DIAGNOSIS — Z743 Need for continuous supervision: Secondary | ICD-10-CM | POA: Diagnosis not present

## 2020-10-30 DIAGNOSIS — R069 Unspecified abnormalities of breathing: Secondary | ICD-10-CM | POA: Diagnosis not present

## 2020-10-30 LAB — HEPATIC FUNCTION PANEL
ALT: 42 U/L (ref 0–44)
AST: 47 U/L — ABNORMAL HIGH (ref 15–41)
Albumin: 3.1 g/dL — ABNORMAL LOW (ref 3.5–5.0)
Alkaline Phosphatase: 80 U/L (ref 38–126)
Bilirubin, Direct: 0.2 mg/dL (ref 0.0–0.2)
Indirect Bilirubin: 0.7 mg/dL (ref 0.3–0.9)
Total Bilirubin: 0.9 mg/dL (ref 0.3–1.2)
Total Protein: 6.8 g/dL (ref 6.5–8.1)

## 2020-10-30 LAB — URINALYSIS, COMPLETE (UACMP) WITH MICROSCOPIC
Bacteria, UA: NONE SEEN
Bilirubin Urine: NEGATIVE
Glucose, UA: NEGATIVE mg/dL
Ketones, ur: NEGATIVE mg/dL
Leukocytes,Ua: NEGATIVE
Nitrite: NEGATIVE
Protein, ur: NEGATIVE mg/dL
Specific Gravity, Urine: 1.005 (ref 1.005–1.030)
pH: 5 (ref 5.0–8.0)

## 2020-10-30 LAB — TSH: TSH: 0.852 u[IU]/mL (ref 0.350–4.500)

## 2020-10-30 LAB — CBC WITH DIFFERENTIAL/PLATELET
Abs Immature Granulocytes: 0.09 10*3/uL — ABNORMAL HIGH (ref 0.00–0.07)
Basophils Absolute: 0 10*3/uL (ref 0.0–0.1)
Basophils Relative: 0 %
Eosinophils Absolute: 0 10*3/uL (ref 0.0–0.5)
Eosinophils Relative: 0 %
HCT: 30.3 % — ABNORMAL LOW (ref 36.0–46.0)
Hemoglobin: 10.3 g/dL — ABNORMAL LOW (ref 12.0–15.0)
Immature Granulocytes: 1 %
Lymphocytes Relative: 3 %
Lymphs Abs: 0.5 10*3/uL — ABNORMAL LOW (ref 0.7–4.0)
MCH: 31.8 pg (ref 26.0–34.0)
MCHC: 34 g/dL (ref 30.0–36.0)
MCV: 93.5 fL (ref 80.0–100.0)
Monocytes Absolute: 1.1 10*3/uL — ABNORMAL HIGH (ref 0.1–1.0)
Monocytes Relative: 8 %
Neutro Abs: 12 10*3/uL — ABNORMAL HIGH (ref 1.7–7.7)
Neutrophils Relative %: 88 %
Platelets: 279 10*3/uL (ref 150–400)
RBC: 3.24 MIL/uL — ABNORMAL LOW (ref 3.87–5.11)
RDW: 13.5 % (ref 11.5–15.5)
WBC: 13.7 10*3/uL — ABNORMAL HIGH (ref 4.0–10.5)
nRBC: 0 % (ref 0.0–0.2)

## 2020-10-30 LAB — BASIC METABOLIC PANEL
Anion gap: 11 (ref 5–15)
BUN: 16 mg/dL (ref 8–23)
CO2: 22 mmol/L (ref 22–32)
Calcium: 8.4 mg/dL — ABNORMAL LOW (ref 8.9–10.3)
Chloride: 103 mmol/L (ref 98–111)
Creatinine, Ser: 0.93 mg/dL (ref 0.44–1.00)
GFR, Estimated: >60 mL/min (ref >60)
Glucose, Bld: 154 mg/dL — ABNORMAL HIGH (ref 70–99)
Potassium: 3 mmol/L — ABNORMAL LOW (ref 3.5–5.1)
Sodium: 136 mmol/L (ref 135–145)

## 2020-10-30 LAB — RESP PANEL BY RT-PCR (FLU A&B, COVID) ARPGX2
Influenza A by PCR: NEGATIVE
Influenza B by PCR: NEGATIVE
SARS Coronavirus 2 by RT PCR: NEGATIVE

## 2020-10-30 LAB — FIBRINOGEN: Fibrinogen: 715 mg/dL — ABNORMAL HIGH (ref 210–475)

## 2020-10-30 LAB — TRIGLYCERIDES: Triglycerides: 54 mg/dL (ref <150)

## 2020-10-30 LAB — D-DIMER, QUANTITATIVE: D-Dimer, Quant: 1.82 ug/mL-FEU — ABNORMAL HIGH (ref 0.00–0.50)

## 2020-10-30 LAB — BRAIN NATRIURETIC PEPTIDE: B Natriuretic Peptide: 179.6 pg/mL — ABNORMAL HIGH (ref 0.0–100.0)

## 2020-10-30 LAB — TROPONIN I (HIGH SENSITIVITY)
Troponin I (High Sensitivity): 18 ng/L — ABNORMAL HIGH (ref <18)
Troponin I (High Sensitivity): 18 ng/L — ABNORMAL HIGH (ref <18)

## 2020-10-30 LAB — STREP PNEUMONIAE URINARY ANTIGEN: Strep Pneumo Urinary Antigen: NEGATIVE

## 2020-10-30 LAB — MAGNESIUM: Magnesium: 2 mg/dL (ref 1.7–2.4)

## 2020-10-30 LAB — LACTIC ACID, PLASMA: Lactic Acid, Venous: 0.8 mmol/L (ref 0.5–1.9)

## 2020-10-30 LAB — LACTATE DEHYDROGENASE: LDH: 190 U/L (ref 98–192)

## 2020-10-30 LAB — PROCALCITONIN: Procalcitonin: <0.1 ng/mL

## 2020-10-30 LAB — FERRITIN: Ferritin: 126 ng/mL (ref 11–307)

## 2020-10-30 LAB — C-REACTIVE PROTEIN: CRP: 21.2 mg/dL — ABNORMAL HIGH (ref <1.0)

## 2020-10-30 MED ORDER — ACETAMINOPHEN 325 MG PO TABS: 650.0000 mg | ORAL_TABLET | Freq: Four times a day (QID) | ORAL | Status: DC | PRN

## 2020-10-30 MED ORDER — FUROSEMIDE 20 MG PO TABS
20.0000 mg | ORAL_TABLET | Freq: Every day | ORAL | Status: DC | PRN
  Filled 2020-10-30: qty 1

## 2020-10-30 MED ORDER — METOPROLOL SUCCINATE ER 50 MG PO TB24
50.0000 mg | ORAL_TABLET | Freq: Every day | ORAL | Status: DC
Start: 2020-10-30 — End: 2020-11-02
  Administered 2020-10-30 – 2020-11-02 (×4): 50 mg via ORAL
  Filled 2020-10-30 (×4): qty 1

## 2020-10-30 MED ORDER — LEVOFLOXACIN 500 MG PO TABS
750.0000 mg | ORAL_TABLET | Freq: Every day | ORAL | Status: DC
  Administered 2020-10-31: 750 mg via ORAL
  Filled 2020-10-30: qty 2

## 2020-10-30 MED ORDER — SODIUM CHLORIDE 0.9% FLUSH
3.0000 mL | Freq: Two times a day (BID) | INTRAVENOUS | Status: DC
  Administered 2020-10-30 – 2020-11-02 (×7): 3 mL via INTRAVENOUS

## 2020-10-30 MED ORDER — FUROSEMIDE 20 MG PO TABS
20.0000 mg | ORAL_TABLET | Freq: Every day | ORAL | Status: DC
  Administered 2020-10-30 – 2020-11-02 (×4): 20 mg via ORAL
  Filled 2020-10-30 (×3): qty 1

## 2020-10-30 MED ORDER — BUSPIRONE HCL 10 MG PO TABS
5.0000 mg | ORAL_TABLET | Freq: Two times a day (BID) | ORAL | Status: DC
  Administered 2020-10-30 – 2020-11-02 (×7): 5 mg via ORAL
  Filled 2020-10-30 (×7): qty 1

## 2020-10-30 MED ORDER — ROSUVASTATIN CALCIUM 10 MG PO TABS
40.0000 mg | ORAL_TABLET | Freq: Every day | ORAL | Status: DC
  Administered 2020-10-30 – 2020-11-01 (×3): 40 mg via ORAL
  Filled 2020-10-30: qty 4
  Filled 2020-10-30: qty 2
  Filled 2020-10-30: qty 8
  Filled 2020-10-30: qty 4

## 2020-10-30 MED ORDER — LEVOTHYROXINE SODIUM 137 MCG PO TABS
137.0000 ug | ORAL_TABLET | Freq: Every day | ORAL | Status: DC
  Administered 2020-10-31 – 2020-11-02 (×3): 137 ug via ORAL
  Filled 2020-10-30 (×4): qty 1

## 2020-10-30 MED ORDER — ACETAMINOPHEN 650 MG RE SUPP: 650.0000 mg | Freq: Four times a day (QID) | RECTAL | Status: DC | PRN

## 2020-10-30 MED ORDER — APIXABAN 5 MG PO TABS
5.0000 mg | ORAL_TABLET | Freq: Two times a day (BID) | ORAL | Status: DC
  Administered 2020-10-30 – 2020-11-02 (×7): 5 mg via ORAL
  Filled 2020-10-30 (×7): qty 1

## 2020-10-30 MED ORDER — POTASSIUM CHLORIDE CRYS ER 20 MEQ PO TBCR
40.0000 meq | EXTENDED_RELEASE_TABLET | Freq: Once | ORAL | Status: AC
  Administered 2020-10-30: 40 meq via ORAL
  Filled 2020-10-30: qty 2

## 2020-10-30 MED ORDER — HYDROCODONE-ACETAMINOPHEN 10-325 MG PO TABS
1.0000 | ORAL_TABLET | Freq: Once | ORAL | Status: AC
Start: 2020-10-30 — End: 2020-10-30
  Administered 2020-10-30: 1 via ORAL
  Filled 2020-10-30: qty 1

## 2020-10-30 MED ORDER — AMIODARONE HCL 200 MG PO TABS
200.0000 mg | ORAL_TABLET | Freq: Every day | ORAL | Status: DC
Start: 2020-10-30 — End: 2020-11-02
  Administered 2020-10-30 – 2020-11-02 (×4): 200 mg via ORAL
  Filled 2020-10-30 (×4): qty 1

## 2020-10-30 MED ORDER — LEVOFLOXACIN IN D5W 750 MG/150ML IV SOLN
750.0000 mg | Freq: Once | INTRAVENOUS | Status: AC
  Administered 2020-10-30: 750 mg via INTRAVENOUS
  Filled 2020-10-30: qty 150

## 2020-10-30 MED ORDER — SODIUM CHLORIDE 0.9 % IV SOLN: 250.0000 mL | INTRAVENOUS | Status: DC | PRN

## 2020-10-30 MED ORDER — SODIUM CHLORIDE 0.9% FLUSH: 3.0000 mL | INTRAVENOUS | Status: DC | PRN

## 2020-10-30 MED ORDER — FUROSEMIDE 40 MG PO TABS: 20.0000 mg | ORAL_TABLET | ORAL | Status: DC

## 2020-10-30 NOTE — H&P (Addendum)
History and Physical    [name] BSW:[id number] DOB: 10-01-1941 DOA: 10/30/2020  PCP: Steele Sizer, MD  Patient coming from: home   Chief Complaint: dyspnea  HPI: [name] is a 80 y.o. female with medical history significant for htn, hypothyroid, p a fib, phtn, hfpef, lbb, avnrt s/p ablation, who presents with the above.  Symptoms began 4 days ago. covid vaccinated and boosted, no known exposures. Fever to 101.5 at home, also feeling short of breath. Non-productive cough. No pleuritic pain or hemoptysis. No chest pain. No n/v/d, is tolerating PO. Compliant w/ home eliquis. Also with chills and sweats. Hasn't had an episode like this in the past. No lower extremity swelling, no pnd, sleeps flat.  ED Course:   Given potassium 40 meq for k of 30 and levaquin for suspected CAP.  Review of Systems: As per HPI otherwise 10 point review of systems negative.    Past Medical History:  Diagnosis Date  . Anxiety   . Bronchitis   . Chronic combined systolic (congestive) and diastolic (congestive) heart failure ([place])    a. 02/2018 Echo: EF 45-50%, diff HK, triv AI, mod MR, mildly dil LA/RA, nl RV fxn, mod TR. PASP 40-64mmHg.  Marland Kitchen Chronic kidney disease   . DJD (degenerative joint disease), cervical   . History of stress test    a. 01/2008 MV: EF 76%. Fair ex tol. No ischemia.  . Hyperlipidemia   . Hypertension   . Hypothyroid   . LBBB (left bundle branch block)   . Leg pain    a. 01/2018 ABI's wnl.  . Lumbar spondylosis   . Persistent atrial fibrillation ([place])    a. 01/2018 Event monitor: 45 runs of SVT, longest 14.5 sec. SVT felt to be Afib/flutter-->2% burden. Longest run of AF 4h 57m (CHA2DS2VASc = 5-->Xarelto); b. 03/2019 s/p DCCV; c. 04/2019 Repeat Amio load and DCCV.  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 BACK SURGERY  07/2019  . CARDIOVERSION N/A 03/26/2018    Procedure: CARDIOVERSION;  Surgeon: Nelva Bush, MD;  Location: [facility] ORS;  Service: Cardiovascular;  Laterality: N/A;  . CARDIOVERSION N/A 04/24/2018   Procedure: CARDIOVERSION;  Surgeon: Minna Merritts, MD;  Location: [facility] ORS;  Service: Cardiovascular;  Laterality: N/A;  . ELECTROPHYSIOLOGY STUDY N/A 02/27/2012   Procedure: ELECTROPHYSIOLOGY STUDY;  Surgeon: Evans Lance, MD;  Location: [facility] CATH LAB;  Service: Cardiovascular;  Laterality: N/A;  . EPS and ablation for SVT  5/13   slow pathway ablation by Dr Lovena Le  . EYE SURGERY     surgery for detatched retina  . NECK SURGERY  02/2020  . SUPRAVENTRICULAR TACHYCARDIA ABLATION N/A 02/27/2012   Procedure: SUPRAVENTRICULAR TACHYCARDIA ABLATION;  Surgeon: Evans Lance, MD;  Location: [facility] CATH LAB;  Service: Cardiovascular;  Laterality: N/A;     reports that she has never smoked. She has never used smokeless tobacco. She reports that she does not drink alcohol and does not use drugs.  Allergies  Allergen Reactions  . Penicillins Swelling and Rash    Has patient had a PCN reaction causing immediate rash, facial/tongue/throat swelling, SOB or lightheadedness with hypotension: Yes Has patient had a PCN reaction causing severe rash involving mucus membranes or skin necrosis: No Has patient had a PCN reaction that required hospitalization: No Has patient had a PCN reaction occurring within the last 10 years: No  If all of the above answers are "NO", then may proceed with Cephalosporin use.   . Codeine Nausea And Vomiting  . Erythromycin Itching and Swelling  . Septra [Sulfamethoxazole-Trimethoprim] Swelling    Family History  Problem Relation Age of Onset  . Alzheimer's disease Mother   . Stroke Father   . Breast cancer Father   . Heart disease Father        Pacemaker  . Breast cancer Paternal Aunt   . Kidney cancer Maternal Grandmother   . Alzheimer's disease Maternal Grandfather   . Thyroid disease Paternal Grandmother   . Stroke  Paternal Grandfather     Prior to Admission medications   Medication Sig Start Date End Date Taking? Authorizing Provider  acetaminophen (TYLENOL) 650 MG CR tablet Take 650-1,300 mg by mouth every 8 (eight) hours as needed for pain.     [provider]  amiodarone (PACERONE) 200 MG tablet TAKE 1 TABLET(200 MG) BY MOUTH DAILY 09/23/20   Minna Merritts, MD  amLODipine (NORVASC) 10 MG tablet Take 1 tablet (10 mg total) by mouth daily. 01/27/20 04/26/20  Theora Gianotti, NP  busPIRone (BUSPAR) 5 MG tablet Take 1 tablet (5 mg total) by mouth 2 (two) times daily. 08/02/20   Steele Sizer, MD  cholecalciferol (VITAMIN D) 1000 UNITS tablet Take 2,000 Units by mouth daily.    [provider]  Cranberry 500 MG TABS Take 2 tablets by mouth daily.    [provider]  doxazosin (CARDURA) 1 MG tablet TAKE 1 TABLET(1 MG) BY MOUTH TWICE DAILY Patient taking differently: Half the time takes once daily based on low BP 04/22/20   Gollan, Kathlene November, MD  ELIQUIS 5 MG TABS tablet TAKE 1 TABLET(5 MG) BY MOUTH TWICE DAILY 06/01/20   Steele Sizer, MD  furosemide (LASIX) 20 MG tablet Take 20 mg by mouth See admin instructions. Take 1 tablet (20mg ) by mouth every morning - take 1 additional tablet (20mg ) after lunch if needed for swelling    [provider]  levothyroxine (SYNTHROID) 137 MCG tablet TAKE 1 TABLET(137 MCG) BY MOUTH EVERY MORNING 05/26/20   Ancil Boozer, Drue Stager, MD  metoprolol succinate (TOPROL-XL) 50 MG 24 hr tablet Take with or immediately following a meal. 01/12/20   Gollan, Kathlene November, MD  Multiple Vitamin (MULITIVITAMIN WITH MINERALS) TABS Take 1 tablet by mouth daily.    [provider]  potassium chloride (KLOR-CON) 10 MEQ tablet TAKE 1 TABLET BY MOUTH EVERY DAY AS DIRECTED. MAY TAKE AN EXTRA PILL AFTER LUNCH WITH FLUID PILL AS NEEDED FOR SWELLING 04/28/20   Minna Merritts, MD  rosuvastatin (CRESTOR) 40 MG tablet TAKE 1 TABLET(40 MG) BY MOUTH DAILY 06/16/20    Ancil Boozer, Drue Stager, MD  temazepam (RESTORIL) 15 MG capsule TAKE 1 CAPSULE(15 MG) BY MOUTH AT BEDTIME AS NEEDED FOR SLEEP 09/13/20   Ancil Boozer, Drue Stager, MD  valsartan (DIOVAN) 320 MG tablet TAKE 1 TABLET(320 MG) BY MOUTH DAILY 02/20/20   Minna Merritts, MD    Physical Exam: Vitals:   10/30/20 0635 10/30/20 0700 10/30/20 0800 10/30/20 1000  BP: (!) 121/48 (!) 129/52 (!) 128/52 (!) 133/49  Pulse: 70 71 66 (!) 38  Resp: 20 18 (!) 23 (!) 25  Temp: 98.8 F (37.1 C)     TempSrc: Axillary     SpO2: 94% 94% 94% 93%    Constitutional: No acute distress Head: Atraumatic Eyes: Conjunctiva clear ENM: Moist mucous membranes. Normal dentition.  Neck: Supple Respiratory: rales at bases more  pronounced on the right. Cardiovascular: Regular rate and rhythm. Soft systolic murmur Abdomen: Non-tender, non-distended. No masses. No rebound or guarding. Positive bowel sounds. Musculoskeletal: No joint deformity upper and lower extremities. Normal ROM, no contractures. Normal muscle tone.  Skin: No rashes, lesions, or ulcers.  Extremities: No peripheral edema. Palpable peripheral pulses. Neurologic: Alert, moving all 4 extremities. Psychiatric: Normal insight and judgement.   Labs on Admission: I have personally reviewed following labs and imaging studies  CBC: Recent Labs  Lab 10/30/20 0403  WBC 13.7*  NEUTROABS 12.0*  HGB 10.3*  HCT 30.3*  MCV 93.5  PLT 123XX123   Basic Metabolic Panel: Recent Labs  Lab 10/30/20 0403  NA 136  K 3.0*  CL 103  CO2 22  GLUCOSE 154*  BUN 16  CREATININE 0.93  CALCIUM 8.4*  MG 2.0   GFR: CrCl cannot be calculated (Unknown ideal weight.). Liver Function Tests: Recent Labs  Lab 10/30/20 0403  AST 47*  ALT 42  ALKPHOS 80  BILITOT 0.9  PROT 6.8  ALBUMIN 3.1*   No results for input(s): LIPASE, AMYLASE in the last 168 hours. No results for input(s): AMMONIA in the last 168 hours. Coagulation Profile: No results for input(s): INR, PROTIME in the last  168 hours. Cardiac Enzymes: No results for input(s): CKTOTAL, CKMB, CKMBINDEX, TROPONINI in the last 168 hours. BNP (last 3 results) No results for input(s): PROBNP in the last 8760 hours. HbA1C: No results for input(s): HGBA1C in the last 72 hours. CBG: No results for input(s): GLUCAP in the last 168 hours. Lipid Profile: Recent Labs    10/30/20 0403  TRIG 54   Thyroid Function Tests: No results for input(s): TSH, T4TOTAL, FREET4, T3FREE, THYROIDAB in the last 72 hours. Anemia Panel: Recent Labs    10/30/20 0403  FERRITIN 126   Urine analysis:    Component Value Date/Time   COLORURINE YELLOW (A) 08/28/2020 1101   APPEARANCEUR CLOUDY (A) 08/28/2020 1101   APPEARANCEUR Clear 04/27/2020 1135   LABSPEC 1.009 08/28/2020 1101   PHURINE 5.0 08/28/2020 1101   GLUCOSEU NEGATIVE 08/28/2020 1101   HGBUR MODERATE (A) 08/28/2020 1101   BILIRUBINUR NEGATIVE 08/28/2020 1101   BILIRUBINUR Negative 04/27/2020 1135   KETONESUR NEGATIVE 08/28/2020 1101   PROTEINUR 30 (A) 08/28/2020 1101   UROBILINOGEN 0.2 02/02/2020 1402   NITRITE NEGATIVE 08/28/2020 1101   LEUKOCYTESUR LARGE (A) 08/28/2020 1101    Radiological Exams on Admission: DG Chest 2 View  Result Date: 10/30/2020 CLINICAL DATA:  Shortness of breath, concern for COVID-19, fever, hypoxic on room air, tachypnea EXAM: CHEST - 2 VIEW COMPARISON:  Radiograph 04/22/2018 FINDINGS: Mixed patchy and consolidative and interstitial opacities are present in both lungs with a perihilar and basilar distribution. Gradient density towards both lung bases with obscuration of the hemidiaphragms likely reflect layering pleural effusions. No pneumothorax. Pulmonary vascularity is somewhat congested and indistinct. Cardiac silhouette is likely enlarged though not significantly changed from prior portable imaging. The aorta is calcified. The remaining cardiomediastinal contours are unremarkable. No acute osseous or soft tissue abnormality. Degenerative  changes are present in the imaged spine and shoulders. Prior cervical fusion incompletely assessed on this exam. Telemetry leads overlie the chest. IMPRESSION: Mixed interstitial and patchy opacities in the lungs, while this certainly could be seen in the setting of infection, suspect some degree CHF/volume overload as well with cardiomegaly, effusions and features to suggest pulmonary edema as well. Electronically Signed   By: Lovena Le M.D.   On: 10/30/2020 05:40  EKG: Independently reviewed. Lbb, sinus  Assessment/Plan Principal Problem:   Community acquired pneumonia Active Problems:   Hypertension   Hypothyroid   Pulmonary hypertension, unspecified ([place])   Depression   Acute respiratory failure ([place])   CAP (community acquired pneumonia)   # Community acquired pneumonia # Acute hypoxic respiratory failure # Sepsis 4 days chills, temp to 101.5. cxr with b/l findings consistent w/ infection vs fluid overload. Also w/ pleural effusion, leukocytosis, tachypnea. Requiring 2 L. Does not appear to be in heart failure though bnp slightly up. ddx includes amiodarone toxicity but given fevers think cap more likely. Hemodynamically stable - continue levo (pcn allergy) - [place] o2, wean as able - will check CT to further assess effusion, holding on CTA given therapeutic eliquis - f/u blood culture and urinary antigens for legion[place] and strep - cough not productive so holding on sputum for culture - will repeat covid pcr to ensure negative  # Hypokalemia k 3, mg wnl, likely 2/2 diuretics. Ed gave 40 meq - another 40 meq ordered - trend  # Anemia H 10.3, no report of bleeding, h was wnl 1 month ago - trend for now  # HFpEF Ef 45-50 in 2019. Here possible pulm edema and mild bnp elevation, clinically does not appear fluid overloaded - cont home lasix - check tte  # Hypothyroid - cont home levo - check tsh (on amio)  # paroxysmal a fib In sinus here. Hx of cardioversion - cont  amiodarone, metoprolol, eliquis  # htn Here bp wnl, infected - hold home doxazosin, amlodipine, valsartan for now - cont home rosuvastatin  # Anxiety - cont home buspar  DVT prophylaxis: home eliquis Code Status: dnr, confirmed w/ patient  Family Communication: boyfriend  Consults called: none    Status is: Inpatient  Remains inpatient appropriate because:Inpatient level of care appropriate due to severity of illness   Dispo: The patient is from: Home              Anticipated d/c is to: Home              Anticipated d/c date is: 2 days              Patient currently is not medically stable to d/c.    Desma Maxim MD [facility] Pager [phone]  If 7PM-7AM, please contact night-coverage [email or web address] Password [facility]  10/30/2020, 10:28 AM

## 2020-10-30 NOTE — ED Notes (Signed)
Pt resting quietly at this time. NAD noted. Pt currently on 2Lpm at this time. NAD noted. Pt denies any needs at this time. Call bell in reach.

## 2020-10-30 NOTE — ED Triage Notes (Signed)
Pt states she thinks she has COVID. Pt with pox on 6lpm of 88-90%. Pt with fever at home of 101. Pt took tylenol pta. Pt with tachypnea noted.

## 2020-10-30 NOTE — Progress Notes (Signed)
Received report from ED nurse, Charlestine Massed RN. Pt will be transferred from ED to room 138, 1 A.

## 2020-10-30 NOTE — ED Notes (Signed)
Pt assisted to toilet. Daughter and pt updated. Transport requested.

## 2020-10-30 NOTE — ED Provider Notes (Signed)
[facility] Emergency Department Provider Note  ____________________________________________   Event Date/Time   First MD Initiated Contact with Patient 10/30/20 [phone]     (approximate)  I have reviewed the triage vital signs and the nursing notes.   HISTORY  Chief Complaint Shortness of Breath   HPI [name] is a 80 y.o. female with a past medical history of CHF, A. fib status post cardioversion, HTN, HDL, hypothyroidism, chronic neck and back pain as well as depression and CKD 3 who presents for assessment approximately 4 days of worsening cough and shortness of breath as well as some substernal chest tightness that is described as pleuritic.  Patient also endorses some fatigue and decreased appetite.  She states she had a fever at home.  She does not wear any oxygen or smoke normally.  She denies any headache, earache, sore throat, abdominal pain, nausea, vomiting, diarrhea, dysuria, rash, extremity pain, recent falls or injuries.  She states she has received all her Vaccines.  No prior similar episodes.  No clearly getting aggravating factors.         Past Medical History:  Diagnosis Date  . Anxiety   . Bronchitis   . Chronic combined systolic (congestive) and diastolic (congestive) heart failure ([place])    a. 02/2018 Echo: EF 45-50%, diff HK, triv AI, mod MR, mildly dil LA/RA, nl RV fxn, mod TR. PASP 40-67mmHg.  Marland Kitchen Chronic kidney disease   . DJD (degenerative joint disease), cervical   . History of stress test    a. 01/2008 MV: EF 76%. Fair ex tol. No ischemia.  . Hyperlipidemia   . Hypertension   . Hypothyroid   . LBBB (left bundle branch block)   . Leg pain    a. 01/2018 ABI's wnl.  . Lumbar spondylosis   . Persistent atrial fibrillation ([place])    a. 01/2018 Event monitor: 45 runs of SVT, longest 14.5 sec. SVT felt to be Afib/flutter-->2% burden. Longest run of AF 4h 37m (CHA2DS2VASc = 5-->Xarelto); b. 03/2019 s/p DCCV; c. 04/2019 Repeat Amio load  and DCCV.  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 Pyelonephritis 03/30/2020  . Acute pyelonephritis 03/29/2020  . Hydronephrosis of right kidney 03/29/2020  . Depression 03/29/2020  . Chronic heart failure ([place]) 05/24/2018  . Atrial fibrillation ([place]) 05/24/2018  . CKD (chronic kidney disease), stage III ([place]) 05/17/2018  . 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 Shortness of breath 05/07/2018  . Pulmonary hypertension, unspecified ([place]) 04/05/2018  . Chronic combined systolic (congestive) and diastolic (congestive) heart failure ([place]) 04/05/2018  . Paroxysmal sinus tachycardia ([place]) 01/29/2018  . Leg pain 01/04/2018  . PAD (peripheral artery disease) ([place]) 01/04/2018  . Carotid artery calcification, bilateral 12/19/2017  . Cervical radiculitis 12/19/2017  . Hyperglycemia 03/06/2016  . Hypothyroid 04/21/2015  . 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 Anxiety 04/21/2015  . Hyperlipidemia 04/21/2015  . Diuretic-induced hypokalemia 04/21/2015  . Palpitations 12/18/2012  . Hypertension 05/27/2012    Past Surgical History:  Procedure Laterality Date  . BACK SURGERY  07/2019  . CARDIOVERSION N/A 03/26/2018   Procedure: CARDIOVERSION;  Surgeon: Nelva Bush, MD;  Location: [facility] ORS;  Service: Cardiovascular;  Laterality: N/A;  . CARDIOVERSION N/A 04/24/2018   Procedure: CARDIOVERSION;  Surgeon: Minna Merritts, MD;  Location: [facility] ORS;  Service: Cardiovascular;  Laterality: N/A;  .  ELECTROPHYSIOLOGY STUDY N/A 02/27/2012   Procedure: ELECTROPHYSIOLOGY STUDY;  Surgeon: Evans Lance, MD;  Location: [facility] CATH LAB;  Service: Cardiovascular;  Laterality: N/A;  . EPS and ablation for SVT  5/13   slow pathway ablation by Dr Lovena Le  . EYE SURGERY     surgery for detatched retina  . NECK SURGERY  02/2020  . SUPRAVENTRICULAR TACHYCARDIA ABLATION N/A 02/27/2012    Procedure: SUPRAVENTRICULAR TACHYCARDIA ABLATION;  Surgeon: Evans Lance, MD;  Location: [facility] C[facility] LAB;  Service: Cardiovascular;  Laterality: N/A;    Prior to Admission medications   Medication Sig Start Date End Date Taking? Authorizing Provider  acetaminophen (TYLENOL) 650 MG CR tablet Take 650-1,300 mg by mouth every 8 (eight) hours as needed for pain.     [provider]  amiodarone (PACERONE) 200 MG tablet TAKE 1 TABLET(200 MG) BY MOUTH DAILY 09/23/20   Minna Merritts, MD  amLODipine (NORVASC) 10 MG tablet Take 1 tablet (10 mg total) by mouth daily. 01/27/20 04/26/20  Theora Gianotti, NP  busPIRone (BUSPAR) 5 MG tablet Take 1 tablet (5 mg total) by mouth 2 (two) times daily. 08/02/20   Steele Sizer, MD  cholecalciferol (VITAMIN D) 1000 UNITS tablet Take 2,000 Units by mouth daily.    [provider]  Cranberry 500 MG TABS Take 2 tablets by mouth daily.    [provider]  doxazosin (CARDURA) 1 MG tablet TAKE 1 TABLET(1 MG) BY MOUTH TWICE DAILY Patient taking differently: Half the time takes once daily based on low BP 04/22/20   Gollan, Kathlene November, MD  ELIQUIS 5 MG TABS tablet TAKE 1 TABLET(5 MG) BY MOUTH TWICE DAILY 06/01/20   Steele Sizer, MD  furosemide (LASIX) 20 MG tablet Take 20 mg by mouth See admin instructions. Take 1 tablet (20mg ) by mouth every morning - take 1 additional tablet (20mg ) after lunch if needed for swelling    [provider]  levothyroxine (SYNTHROID) 137 MCG tablet TAKE 1 TABLET(137 MCG) BY MOUTH EVERY MORNING 05/26/20   Ancil Boozer, Drue Stager, MD  metoprolol succinate (TOPROL-XL) 50 MG 24 hr tablet Take with or immediately following a meal. 01/12/20   Gollan, Kathlene November, MD  Multiple Vitamin (MULITIVITAMIN WITH MINERALS) TABS Take 1 tablet by mouth daily.    [provider]  potassium chloride (KLOR-CON) 10 MEQ tablet TAKE 1 TABLET BY MOUTH EVERY DAY AS DIRECTED. MAY TAKE AN EXTRA PILL AFTER LUNCH WITH FLUID PILL AS  NEEDED FOR SWELLING 04/28/20   Gollan, Kathlene November, MD  rosuvastatin (CRESTOR) 40 MG tablet TAKE 1 TABLET(40 MG) BY MOUTH DAILY 06/16/20   Ancil Boozer, Drue Stager, MD  temazepam (RESTORIL) 15 MG capsule TAKE 1 CAPSULE(15 MG) BY MOUTH AT BEDTIME AS NEEDED FOR SLEEP 09/13/20   Ancil Boozer, Drue Stager, MD  valsartan (DIOVAN) 320 MG tablet TAKE 1 TABLET(320 MG) BY MOUTH DAILY 02/20/20   Minna Merritts, MD    Allergies Penicillins, Codeine, Erythromycin, and Septra [sulfamethoxazole-trimethoprim]  Family History  Problem Relation Age of Onset  . Alzheimer's disease Mother   . Stroke Father   . Breast cancer Father   . Heart disease Father        Pacemaker  . Breast cancer Paternal Aunt   . Kidney cancer Maternal Grandmother   . Alzheimer's disease Maternal Grandfather   . Thyroid disease Paternal Grandmother   . Stroke Paternal Grandfather     Social History Social History   Tobacco Use  . Smoking status: Never Smoker  . Smokeless tobacco:  Never Used  . Tobacco comment: smoking cessation materials not required  Vaping Use  . Vaping Use: Never used  Substance Use Topics  . Alcohol use: No  . Drug use: No    Review of Systems  Review of Systems  Constitutional: Positive for malaise/fatigue. Negative for chills and fever.  HENT: Negative for sore throat.   Eyes: Negative for pain.  Respiratory: Positive for cough and shortness of breath. Negative for stridor.   Cardiovascular: Negative for chest pain.  Gastrointestinal: Negative for vomiting.  Genitourinary: Negative for dysuria.  Musculoskeletal: Positive for back pain ( chronic) and neck pain ( chronic).  Skin: Negative for rash.  Neurological: Negative for seizures, loss of consciousness and headaches.  Psychiatric/Behavioral: Negative for suicidal ideas.  All other systems reviewed and are negative.     ____________________________________________   PHYSICAL EXAM:  VITAL SIGNS: ED Triage Vitals [10/30/20 0322]  Enc Vitals Group      BP (!) 140/45     Pulse Rate 83     Resp (!) 24     Temp 99.8 F (37.7 C)     Temp Source Oral     SpO2 90 %     Weight      Height      Head Circumference      Peak Flow      Pain Score      Pain Loc      Pain Edu?      Excl. in [place]?    Vitals:   10/30/20 0430 10/30/20 0635  BP: (!) 143/51 (!) 121/48  Pulse: 84 70  Resp: (!) 22 20  Temp:  98.8 F (37.1 C)  SpO2: 94% 94%   Physical Exam Vitals and nursing note reviewed.  Constitutional:      General: She is not in acute distress.    Appearance: She is well-developed and well-nourished. She is ill-appearing.  HENT:     Head: Normocephalic and atraumatic.     Right Ear: External ear normal.     Left Ear: External ear normal.     Nose: Nose normal.  Eyes:     Conjunctiva/sclera: Conjunctivae normal.  Cardiovascular:     Rate and Rhythm: Normal rate and regular rhythm.     Heart sounds: No murmur heard.   Pulmonary:     Effort: Tachypnea and respiratory distress present.     Breath sounds: Examination of the right-lower field reveals decreased breath sounds. Examination of the left-lower field reveals decreased breath sounds. Decreased breath sounds and rhonchi present.  Abdominal:     Palpations: Abdomen is soft.     Tenderness: There is no abdominal tenderness.  Musculoskeletal:        General: No edema.     Cervical back: Neck supple. No rigidity.     Right lower leg: No edema.     Left lower leg: No edema.  Skin:    General: Skin is warm and dry.     Capillary Refill: Capillary refill takes 2 to 3 seconds.  Neurological:     Mental Status: She is alert and oriented to person, place, and time.  Psychiatric:        Mood and Affect: Mood and affect and mood normal.      ____________________________________________   LABS (all labs ordered are listed, but only abnormal results are displayed)  Labs Reviewed  CBC WITH DIFFERENTIAL/PLATELET - Abnormal; Notable for the following components:       Result Value  WBC 13.7 (*)    RBC 3.24 (*)    Hemoglobin 10.3 (*)    HCT 30.3 (*)    Neutro Abs 12.0 (*)    Lymphs Abs 0.5 (*)    Monocytes Absolute 1.1 (*)    Abs Immature Granulocytes 0.09 (*)    All other components within normal limits  BASIC METABOLIC PANEL - Abnormal; Notable for the following components:   Potassium 3.0 (*)    Glucose, Bld 154 (*)    Calcium 8.4 (*)    All other components within normal limits  BRAIN NATRIURETIC PEPTIDE - Abnormal; Notable for the following components:   B Natriuretic Peptide 179.6 (*)    All other components within normal limits  FIBRINOGEN - Abnormal; Notable for the following components:   Fibrinogen 715 (*)    All other components within normal limits  HEPATIC FUNCTION PANEL - Abnormal; Notable for the following components:   Albumin 3.1 (*)    AST 47 (*)    All other components within normal limits  TROPONIN I (HIGH SENSITIVITY) - Abnormal; Notable for the following components:   Troponin I (High Sensitivity) 18 (*)    All other components within normal limits  CULTURE, BLOOD (ROUTINE X 2)  CULTURE, BLOOD (ROUTINE X 2)  RESP PANEL BY RT-PCR (FLU A&B, COVID) ARPGX2  PROCALCITONIN  LACTIC ACID, PLASMA  LACTATE DEHYDROGENASE  FERRITIN  TRIGLYCERIDES  MAGNESIUM  C-REACTIVE PROTEIN  D-DIMER, QUANTITATIVE (NOT AT [facility])  POC SARS CORONAVIRUS 2 AG -  ED  TROPONIN I (HIGH SENSITIVITY)   ____________________________________________  EKG  Sinus rhythm with a ventricular rate of 77, first-degree AV block with a PR interval of 228, left bundle branch block and some nonspecific changes throughout that appear grossly very similar to ECG obtained on 03/29/2020. ____________________________________________  RADIOLOGY  ED MD interpretation: Bilateral multifocal pneumonia with bilateral small effusions and cardiomegaly.  No pneumothorax.  No significant pulmonary edema.  Official radiology report(s): DG Chest 2 View  Result Date:  10/30/2020 CLINICAL DATA:  Shortness of breath, concern for COVID-19, fever, hypoxic on room air, tachypnea EXAM: CHEST - 2 VIEW COMPARISON:  Radiograph 04/22/2018 FINDINGS: Mixed patchy and consolidative and interstitial opacities are present in both lungs with a perihilar and basilar distribution. Gradient density towards both lung bases with obscuration of the hemidiaphragms likely reflect layering pleural effusions. No pneumothorax. Pulmonary vascularity is somewhat congested and indistinct. Cardiac silhouette is likely enlarged though not significantly changed from prior portable imaging. The aorta is calcified. The remaining cardiomediastinal contours are unremarkable. No acute osseous or soft tissue abnormality. Degenerative changes are present in the imaged spine and shoulders. Prior cervical fusion incompletely assessed on this exam. Telemetry leads overlie the chest. IMPRESSION: Mixed interstitial and patchy opacities in the lungs, while this certainly could be seen in the setting of infection, suspect some degree CHF/volume overload as well with cardiomegaly, effusions and features to suggest pulmonary edema as well. Electronically Signed   By: Lovena Le M.D.   On: 10/30/2020 05:40    ____________________________________________   PROCEDURES  Procedure(s) performed (including Critical Care):  .Critical Care Performed by: Lucrezia Starch, MD Authorized by: Lucrezia Starch, MD   Critical care provider statement:    Critical care time (minutes):  45   Critical care time was exclusive of:  Separately billable procedures and treating other patients   Critical care was necessary to treat or prevent imminent or life-threatening deterioration of the following conditions:  Respiratory failure  Critical care was time spent personally by me on the following activities:  Discussions with consultants, evaluation of patient's response to treatment, examination of patient, ordering and performing  treatments and interventions, ordering and review of laboratory studies, ordering and review of radiographic studies, pulse oximetry, re-evaluation of patient's condition, obtaining history from patient or surrogate and review of old charts     ____________________________________________   [place] / [place] / ED COURSE      Patient presents for assessment of worsening cough and shortness of breath in the last couple of days.  Is associated with fever at home.  Per EMS patient had a set of 88% and 90% on 6 L.  On arrival patient is noted to desat rapidly on attempted down titration was able to maintain her sats in the low 90s on 4 L.  She is tachypneic but otherwise has stable blood pressure and is afebrile.  Differential includes Covid, bacterial pneumonia, PE, ACS, myocarditis, anemia, CHF exacerbation.  Chest x-ray shows evidence of bilateral pneumonia with some pleural effusions and enlarged heart.  CBC shows leukocytosis with WBC count of 13.7, hemoglobin of 10.3 compared to 12.19-month ago and unremarkable platelets.  BMP remarkable for potassium of 3 with otherwise no significant ocular metabolic derangements.  BNP is 179.  Troponin is nonelevated at 18 given patient denies any chest pain suspicion for ACS or myocarditis at this time.  Procalcitonin is undetectable.  Lactic acid 0.8.  Hepatic function panel unremarkable.  D-dimer sent to assess for risk for PE.  Patient given Levaquin to cover for possible commune acquired pneumonia with COVID-19 also high with differential.  PCR sent.  I will plan to admit to hospital service for further evaluation management.  ____________________________________________   FINAL CLINICAL IMPRESSION(S) / ED DIAGNOSES  Final diagnoses:  Acute respiratory failure with hypoxia ([place])  Community acquired pneumonia, unspecified laterality  Person under investigation for COVID-19  Hypokalemia    Medications  levofloxacin  (LEVAQUIN) IVPB 750 mg (750 mg Intravenous New Bag/Given 10/30/20 0642)  potassium chloride SA (KLOR-CON) CR tablet 40 mEq (40 mEq Oral Given 10/30/20 [id number])     ED Discharge Orders    None       Note:  This document was prepared using Dragon voice recognition software and may include unintentional dictation errors.   Lucrezia Starch, MD 10/30/20 [phone]

## 2020-10-30 NOTE — Progress Notes (Signed)
ANTICOAGULATION CONSULT NOTE - Initial Consult  Pharmacy Consult for Apixaban Indication: atrial fibrillation  Allergies  Allergen Reactions  . Penicillins Swelling and Rash    Has patient had a PCN reaction causing immediate rash, facial/tongue/throat swelling, SOB or lightheadedness with hypotension: Yes Has patient had a PCN reaction causing severe rash involving mucus membranes or skin necrosis: No Has patient had a PCN reaction that required hospitalization: No Has patient had a PCN reaction occurring within the last 10 years: No If all of the above answers are "NO", then may proceed with Cephalosporin use.   . Codeine Nausea And Vomiting  . Erythromycin Itching and Swelling  . Septra [Sulfamethoxazole-Trimethoprim] Swelling    Patient Measurements:   Weight 69.9 kg on 12/13/2017 from Care Everywhere  Vital Signs: Temp: 98.8 F (37.1 C) (01/08 0635) Temp Source: Axillary (01/08 0635) BP: 133/49 (01/08 1000) Pulse Rate: 38 (01/08 1000)  Labs: Recent Labs    10/30/20 0403 10/30/20 0635  HGB 10.3*  --   HCT 30.3*  --   PLT 279  --   CREATININE 0.93  --   TROPONINIHS 18* 18*    CrCl cannot be calculated (Unknown ideal weight.).   Medical History: Past Medical History:  Diagnosis Date  . Anxiety   . Bronchitis   . Chronic combined systolic (congestive) and diastolic (congestive) heart failure ([place])    a. 02/2018 Echo: EF 45-50%, diff HK, triv AI, mod MR, mildly dil LA/RA, nl RV fxn, mod TR. PASP 40-35mmHg.  Marland Kitchen Chronic kidney disease   . DJD (degenerative joint disease), cervical   . History of stress test    a. 01/2008 MV: EF 76%. Fair ex tol. No ischemia.  . Hyperlipidemia   . Hypertension   . Hypothyroid   . LBBB (left bundle branch block)   . Leg pain    a. 01/2018 ABI's wnl.  . Lumbar spondylosis   . Persistent atrial fibrillation ([place])    a. 01/2018 Event monitor: 45 runs of SVT, longest 14.5 sec. SVT felt to be Afib/flutter-->2% burden. Longest run of  AF 4h 58m (CHA2DS2VASc = 5-->Xarelto); b. 03/2019 s/p DCCV; c. 04/2019 Repeat Amio load and DCCV.  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  Goal of Therapy:  Monitor platelets by anticoagulation protocol: Yes   Plan:  Will continue home dose of apixaban 5 mg PO BID SCr and CBC at least every three days Entered order for pt's weight to be obtained. Will need to f/u on weight.    Rayna Sexton L 10/30/2020,10:39 AM

## 2020-10-31 ENCOUNTER — Inpatient Hospital Stay (HOSPITAL_COMMUNITY)
Admit: 2020-10-31 | Discharge: 2020-10-31 | Disposition: A | Discharge status: Home / Self Care | Payer: Medicare Other | Attending: Internal Medicine | Admitting: Internal Medicine

## 2020-10-31 DIAGNOSIS — E785 Hyperlipidemia, unspecified: Secondary | ICD-10-CM

## 2020-10-31 DIAGNOSIS — R0602 Shortness of breath: Secondary | ICD-10-CM

## 2020-10-31 DIAGNOSIS — J9601 Acute respiratory failure with hypoxia: Secondary | ICD-10-CM

## 2020-10-31 DIAGNOSIS — A419 Sepsis, unspecified organism: Secondary | ICD-10-CM

## 2020-10-31 DIAGNOSIS — I361 Nonrheumatic tricuspid (valve) insufficiency: Secondary | ICD-10-CM | POA: Diagnosis not present

## 2020-10-31 DIAGNOSIS — I480 Paroxysmal atrial fibrillation: Secondary | ICD-10-CM

## 2020-10-31 DIAGNOSIS — D6869 Other thrombophilia: Secondary | ICD-10-CM

## 2020-10-31 DIAGNOSIS — E039 Hypothyroidism, unspecified: Secondary | ICD-10-CM

## 2020-10-31 DIAGNOSIS — J189 Pneumonia, unspecified organism: Secondary | ICD-10-CM

## 2020-10-31 DIAGNOSIS — I340 Nonrheumatic mitral (valve) insufficiency: Secondary | ICD-10-CM

## 2020-10-31 DIAGNOSIS — R6520 Severe sepsis without septic shock: Secondary | ICD-10-CM

## 2020-10-31 LAB — BASIC METABOLIC PANEL WITH GFR
Anion gap: 10 (ref 5–15)
BUN: 16 mg/dL (ref 8–23)
CO2: 23 mmol/L (ref 22–32)
Calcium: 9 mg/dL (ref 8.9–10.3)
Chloride: 104 mmol/L (ref 98–111)
Creatinine, Ser: 0.88 mg/dL (ref 0.44–1.00)
GFR, Estimated: >60 mL/min
Glucose, Bld: 114 mg/dL — ABNORMAL HIGH (ref 70–99)
Potassium: 4 mmol/L (ref 3.5–5.1)
Sodium: 137 mmol/L (ref 135–145)

## 2020-10-31 LAB — CBC
HCT: 32.6 % — ABNORMAL LOW (ref 36.0–46.0)
Hemoglobin: 11.4 g/dL — ABNORMAL LOW (ref 12.0–15.0)
MCH: 32.1 pg (ref 26.0–34.0)
MCHC: 35 g/dL (ref 30.0–36.0)
MCV: 91.8 fL (ref 80.0–100.0)
Platelets: 318 10*3/uL (ref 150–400)
RBC: 3.55 MIL/uL — ABNORMAL LOW (ref 3.87–5.11)
RDW: 13.6 % (ref 11.5–15.5)
WBC: 13.1 10*3/uL — ABNORMAL HIGH (ref 4.0–10.5)
nRBC: 0 % (ref 0.0–0.2)

## 2020-10-31 LAB — ECHOCARDIOGRAM COMPLETE
Area-P 1/2: 4.06 cm2
Height: 66 in
S' Lateral: 3.04 cm
Weight: 2028.8 oz

## 2020-10-31 LAB — SARS CORONAVIRUS 2 (TAT 6-24 HRS): SARS Coronavirus 2: NEGATIVE

## 2020-10-31 MED ORDER — LEVOFLOXACIN 500 MG PO TABS
750.0000 mg | ORAL_TABLET | ORAL | Status: DC
  Administered 2020-11-02: 750 mg via ORAL
  Filled 2020-10-31: qty 2

## 2020-10-31 MED ORDER — IPRATROPIUM-ALBUTEROL 0.5-2.5 (3) MG/3ML IN SOLN
3.0000 mL | Freq: Four times a day (QID) | RESPIRATORY_TRACT | Status: DC
  Administered 2020-10-31 – 2020-11-01 (×6): 3 mL via RESPIRATORY_TRACT
  Filled 2020-10-31 (×7): qty 3

## 2020-10-31 MED ORDER — POTASSIUM CHLORIDE CRYS ER 10 MEQ PO TBCR
10.0000 meq | EXTENDED_RELEASE_TABLET | Freq: Every day | ORAL | Status: DC
  Administered 2020-10-31 – 2020-11-02 (×3): 10 meq via ORAL
  Filled 2020-10-31 (×3): qty 1

## 2020-10-31 MED ORDER — METHYLPREDNISOLONE SODIUM SUCC 40 MG IJ SOLR
40.0000 mg | Freq: Every day | INTRAMUSCULAR | Status: DC
  Administered 2020-10-31 – 2020-11-02 (×3): 40 mg via INTRAVENOUS
  Filled 2020-10-31 (×3): qty 1

## 2020-10-31 NOTE — Evaluation (Signed)
Physical Therapy Evaluation Patient Details Name: [name] MRN: [id number] DOB: August 29, 1941 Today's Date: 10/31/2020   History of Present Illness  Patient is a 80 year old female with severe sepsis, present on admission with bilateral multifocal pneumonia, leukocytosis, tachycardia and tachypnea.  Patient also had acute hypoxic respiratory failure with a pulse ox of 90% on 6 L of oxygen on presentation. PT ordered for weakness.   Clinical Impression  PT evaluation completed. Patient is pleasant and cooperative during session. At baseline, patient is independent with mobility without assistive device for ambulation.  Patient was on room air on arrival to room. Sp02 93% at rest. Patient ambulated 31ft with rolling walker, supervision for safety, with Sp02 88-90% while walking on room air. Cues for breathing techniques while walking and mild fatigued noted with activity. Patient walked a short distance in room without assistive device with more narrow base of support and was reaching out for furniture for support. Recommend rolling walker with ambulation for safety and improved gait pattern. Recommend acute PT to follow up to maximize independence and address remaining functional limitations. Patient declined the need for HHPT at discharge and reports she will have intermittent assistance as needed from family/friends.     Follow Up Recommendations Supervision - Intermittent    Equipment Recommendations  Rolling walker with 5" wheels    Recommendations for Other Services       Precautions / Restrictions Precautions Precautions: Fall Restrictions Weight Bearing Restrictions: No      Mobility  Bed Mobility Overal bed mobility: Modified Independent             General bed mobility comments: head of bed elevated. extra time required but no physical assistance needed for bed mobility    Transfers Overall transfer level: Needs assistance Equipment used: Rolling walker (2  wheeled) Transfers: Sit to/from Stand Sit to Stand: Modified independent (Device/Increase time)         General transfer comment: extra time required. two standing bouts performed from bed and from toilet with good safety awareness and no loss of balance  Ambulation/Gait Ambulation/Gait assistance: Supervision Gait Distance (Feet): 50 Feet Assistive device: Rolling walker (2 wheeled) Gait Pattern/deviations: Narrow base of support Gait velocity: decreased   General Gait Details: patient ambulated with rolling walker without loss of balance. patient ambulated a short distance without rolling walker and was reaching out for furniture for support. recommend to use rolling walker for improved gait pattern and safety with ambulation. Sp02 88-90% on room air while walking. cues for breathing techniques  Stairs            Wheelchair Mobility    Modified Rankin (Stroke Patients Only)       Balance Overall balance assessment: Needs assistance Sitting-balance support: No upper extremity supported;Feet supported Sitting balance-Leahy Scale: Good Sitting balance - Comments: patient able to reach outside base of support without loss of balance   Standing balance support: During functional activity;No upper extremity supported Standing balance-Leahy Scale: Good Standing balance comment: patient demonstrated good weight shift and reaching outside base of support without loss of balance                             Pertinent Vitals/Pain Pain Assessment: No/denies pain    Home Living Family/patient expects to be discharged to:: Private residence Living Arrangements: Alone Available Help at Discharge: Friend(s);Available PRN/intermittently Type of Home: House Home Access: Stairs to enter Entrance Stairs-Rails:  (one railing)  Entrance Stairs-Number of Steps: 2 Home Layout: Able to live on main level with bedroom/bathroom (has an apartment downstairs but not required for  everyday needs) Home Equipment: Grab bars - tub/shower;Cane - single point      Prior Function Level of Independence: Independent         Comments: patient reports no falls however reports she is off balance occasionally due to impaired vision. patient is independent with mobility and drives     Hand Dominance        Extremity/Trunk Assessment   Upper Extremity Assessment Upper Extremity Assessment: Overall WFL for tasks assessed    Lower Extremity Assessment Lower Extremity Assessment: Overall WFL for tasks assessed       Communication   Communication: No difficulties  Cognition Arousal/Alertness: Awake/alert Behavior During Therapy: WFL for tasks assessed/performed Overall Cognitive Status: Within Functional Limits for tasks assessed                                        General Comments      Exercises     Assessment/Plan    PT Assessment Patient needs continued PT services  PT Problem List Decreased activity tolerance;Decreased mobility;Decreased knowledge of use of DME;Cardiopulmonary status limiting activity       PT Treatment Interventions DME instruction;Gait training;Stair training;Functional mobility training;Therapeutic activities;Therapeutic exercise;Balance training    PT Goals (Current goals can be found in the Care Plan section)  Acute Rehab PT Goals Patient Stated Goal: to go home PT Goal Formulation: With patient Time For Goal Achievement: 11/14/20 Potential to Achieve Goals: Good    Frequency Min 2X/week   Barriers to discharge        Co-evaluation               AM-PAC PT "6 Clicks" Mobility  Outcome Measure Help needed turning from your back to your side while in a flat bed without using bedrails?: None Help needed moving from lying on your back to sitting on the side of a flat bed without using bedrails?: None Help needed moving to and from a bed to a chair (including a wheelchair)?: None Help needed  standing up from a chair using your arms (e.g., wheelchair or bedside chair)?: None Help needed to walk in hospital room?: A Little Help needed climbing 3-5 steps with a railing? : A Little 6 Click Score: 22    End of Session   Activity Tolerance: Patient tolerated treatment well Patient left: in bed;with call bell/phone within reach Nurse Communication: Mobility status PT Visit Diagnosis: Unsteadiness on feet (R26.81)    Time: 5809-9833 PT Time Calculation (min) (ACUTE ONLY): 25 min   Charges:   PT Evaluation $PT Eval Low Complexity: 1 Low PT Treatments $Gait Training: 8-22 mins        Minna Merritts, PT, MPT   Percell Locus 10/31/2020, 3:43 PM

## 2020-10-31 NOTE — Progress Notes (Addendum)
Patient ID: [name], female   DOB: January 25, 1941, 80 y.o.   MRN: [id number] [facility] PROGRESS NOTE  SHARIE NATALI [id number] DOB: [birth year] DOA: 10/30/2020 PCP: Steele Sizer, MD  HPI/Subjective: Patient has been having some symptoms for quite a few days.  Some shortness of breath.  Some cough.  She has a history of CHF but does not believe that this is her CHF acting up.  Had some fever the other day.  Admitted with pneumonia.  Objective: Vitals:   10/31/20 1016 10/31/20 1110  BP:  (!) 110/50  Pulse: 86 80  Resp: 16 17  Temp:  98.9 F (37.2 C)  SpO2: 93% 95%    Intake/Output Summary (Last 24 hours) at 10/31/2020 1234 Last data filed at 10/31/2020 1029 Gross per 24 hour  Intake 243 ml  Output 900 ml  Net -657 ml   Filed Weights   10/30/20 2034 10/31/20 0556  Weight: 57.5 kg 57.5 kg    ROS: Review of Systems  Respiratory: Positive for cough and shortness of breath.   Cardiovascular: Negative for chest pain.  Gastrointestinal: Negative for abdominal pain, nausea and vomiting.   Exam: Physical Exam HENT:     Head: Normocephalic.     Mouth/Throat:     Pharynx: No oropharyngeal exudate.  Eyes:     General: Lids are normal.     Conjunctiva/sclera: Conjunctivae normal.     Pupils: Pupils are equal, round, and reactive to light.  Cardiovascular:     Rate and Rhythm: Normal rate and regular rhythm.     Heart sounds: Normal heart sounds, S1 normal and S2 normal.  Pulmonary:     Breath sounds: No decreased breath sounds, wheezing, rhonchi or rales.  Abdominal:     Palpations: Abdomen is soft.     Tenderness: There is no abdominal tenderness.  Musculoskeletal:     Right ankle: No swelling.     Left ankle: No swelling.  Skin:    General: Skin is warm.     Findings: No rash.  Neurological:     Mental Status: She is alert and oriented to person, place, and time.       Data Reviewed: Basic Metabolic Panel: Recent Labs  Lab 10/30/20 0403  10/31/20 0456  NA 136 137  K 3.0* 4.0  CL 103 104  CO2 22 23  GLUCOSE 154* 114*  BUN 16 16  CREATININE 0.93 0.88  CALCIUM 8.4* 9.0  MG 2.0  --    Liver Function Tests: Recent Labs  Lab 10/30/20 0403  AST 47*  ALT 42  ALKPHOS 80  BILITOT 0.9  PROT 6.8  ALBUMIN 3.1*   CBC: Recent Labs  Lab 10/30/20 0403 10/31/20 0456  WBC 13.7* 13.1*  NEUTROABS 12.0*  --   HGB 10.3* 11.4*  HCT 30.3* 32.6*  MCV 93.5 91.8  PLT 279 318   BNP (last 3 results) Recent Labs    03/29/20 1422 10/30/20 0403  BNP 169.2* 179.6*     Recent Results (from the past 240 hour(s))  Blood Culture (routine x 2)     Status: None (Preliminary result)   Collection Time: 10/30/20  4:45 AM   Specimen: BLOOD  Result Value Ref Range Status   Specimen Description BLOOD LEFT ANTECUBITAL  Final   Special Requests   Final    BOTTLES DRAWN AEROBIC AND ANAEROBIC Blood Culture adequate volume   Culture   Final    NO GROWTH 1 DAY Performed at [facility],  [place], [place] [address]    Report Status PENDING  Incomplete  Blood Culture (routine x 2)     Status: None (Preliminary result)   Collection Time: 10/30/20  5:00 AM   Specimen: BLOOD  Result Value Ref Range Status   Specimen Description BLOOD RIGHT ANTECUBITAL  Final   Special Requests   Final    BOTTLES DRAWN AEROBIC AND ANAEROBIC Blood Culture adequate volume   Culture   Final    NO GROWTH 1 DAY Performed at [facility], [address] [place], [place] [address]    Report Status PENDING  Incomplete  Resp Panel by RT-PCR (Flu A&B, Covid) Nasopharyngeal Swab     Status: None   Collection Time: 10/30/20  6:35 AM   Specimen: Nasopharyngeal Swab; Nasopharyngeal(NP) swabs in vial transport medium  Result Value Ref Range Status   SARS Coronavirus 2 by RT PCR NEGATIVE NEGATIVE Final    Comment: (NOTE) SARS-CoV-2 target nucleic acids are NOT DETECTED.  The SARS-CoV-2 RNA is generally detectable in upper  respiratory specimens during the acute phase of infection. The lowest concentration of SARS-CoV-2 viral copies this assay can detect is 138 copies/mL. A negative result does not preclude SARS-Cov-2 infection and should not be used as the sole basis for treatment or other patient management decisions. A negative result may occur with  improper specimen collection/handling, submission of specimen other than nasopharyngeal swab, presence of viral mutation(s) within the areas targeted by this assay, and inadequate number of viral copies(<138 copies/mL). A negative result must be combined with clinical observations, patient history, and epidemiological information. The expected result is Negative.  Fact Sheet for Patients:  EntrepreneurPulse.com.au  Fact Sheet for Healthcare Providers:  IncredibleEmployment.be  This test is no t yet approved or cleared by the [place] FDA and  has been authorized for detection and/or diagnosis of SARS-CoV-2 by FDA under an Emergency Use Authorization (EUA). This EUA will remain  in effect (meaning this test can be used) for the duration of the COVID-19 declaration under Section 564(b)(1) of the Act, 21 U.S.C.section 360bbb-3(b)(1), unless the authorization is terminated  or revoked sooner.       Influenza A by PCR NEGATIVE NEGATIVE Final   Influenza B by PCR NEGATIVE NEGATIVE Final    Comment: (NOTE) The Xpert Xpress SARS-CoV-2/FLU/RSV plus assay is intended as an aid in the diagnosis of influenza from Nasopharyngeal swab specimens and should not be used as a sole basis for treatment. Nasal washings and aspirates are unacceptable for Xpert Xpress SARS-CoV-2/FLU/RSV testing.  Fact Sheet for Patients: EntrepreneurPulse.com.au  Fact Sheet for Healthcare Providers: IncredibleEmployment.be  This test is not yet approved or cleared by the [place] FDA and has been  authorized for detection and/or diagnosis of SARS-CoV-2 by FDA under an Emergency Use Authorization (EUA). This EUA will remain in effect (meaning this test can be used) for the duration of the COVID-19 declaration under Section 564(b)(1) of the Act, 21 U.S.C. section 360bbb-3(b)(1), unless the authorization is terminated or revoked.  Performed at [facility], [place], [place] [address]   SARS CORONAVIRUS 2 (TAT 6-24 HRS) Nasopharyngeal Nasopharyngeal Swab     Status: None   Collection Time: 10/30/20  2:13 PM   Specimen: Nasopharyngeal Swab  Result Value Ref Range Status   SARS Coronavirus 2 NEGATIVE NEGATIVE Final    Comment: (NOTE) SARS-CoV-2 target nucleic acids are NOT DETECTED.  The SARS-CoV-2 RNA is generally detectable in upper and lower  respiratory specimens during the acute phase of infection. Negative results do not preclude SARS-CoV-2 infection, do not rule out co-infections with other pathogens, and should not be used as the sole basis for treatment or other patient management decisions. Negative results must be combined with clinical observations, patient history, and epidemiological information. The expected result is Negative.  Fact Sheet for Patients: SugarRoll.be  Fact Sheet for Healthcare Providers: https://www.woods-mathews.com/  This test is not yet approved or cleared by the [place] FDA and  has been authorized for detection and/or diagnosis of SARS-CoV-2 by FDA under an Emergency Use Authorization (EUA). This EUA will remain  in effect (meaning this test can be used) for the duration of the COVID-19 declaration under Se ction 564(b)(1) of the Act, 21 U.S.C. section 360bbb-3(b)(1), unless the authorization is terminated or revoked sooner.  Performed at [facility], [place] [address], [place], [place] [address]      Studies: DG Chest 2 View  Result Date: 10/30/2020 CLINICAL  DATA:  Shortness of breath, concern for COVID-19, fever, hypoxic on room air, tachypnea EXAM: CHEST - 2 VIEW COMPARISON:  Radiograph 04/22/2018 FINDINGS: Mixed patchy and consolidative and interstitial opacities are present in both lungs with a perihilar and basilar distribution. Gradient density towards both lung bases with obscuration of the hemidiaphragms likely reflect layering pleural effusions. No pneumothorax. Pulmonary vascularity is somewhat congested and indistinct. Cardiac silhouette is likely enlarged though not significantly changed from prior portable imaging. The aorta is calcified. The remaining cardiomediastinal contours are unremarkable. No acute osseous or soft tissue abnormality. Degenerative changes are present in the imaged spine and shoulders. Prior cervical fusion incompletely assessed on this exam. Telemetry leads overlie the chest. IMPRESSION: Mixed interstitial and patchy opacities in the lungs, while this certainly could be seen in the setting of infection, suspect some degree CHF/volume overload as well with cardiomegaly, effusions and features to suggest pulmonary edema as well. Electronically Signed   By: Lovena Le M.D.   On: 10/30/2020 05:40   CT CHEST WO CONTRAST  Result Date: 10/30/2020 CLINICAL DATA:  Abnormal x-ray. EXAM: CT CHEST WITHOUT CONTRAST TECHNIQUE: Multidetector CT imaging of the chest was performed following the standard protocol without IV contrast. COMPARISON:  Chest x-ray from earlier same day. FINDINGS: Cardiovascular: Cardiomegaly. No pericardial effusion. No thoracic aortic aneurysm. Aortic atherosclerosis. Scattered coronary artery calcifications. Mediastinum/Nodes: Mildly prominent lymph nodes within the mediastinum and bilateral perihilar regions, likely reactive in nature. Esophagus is unremarkable. Small hiatal hernia. Trachea is unremarkable. Lungs/Pleura: Patchy bilateral ground-glass opacities and consolidations, with the ground glass opacities most  prominent within the LEFT upper lobe and the consolidations most prominent at the bilateral lung bases. No pneumothorax. Small bilateral pleural effusions. Upper Abdomen: Limited images of the upper abdomen are unremarkable. Musculoskeletal: No acute or suspicious osseous abnormality. IMPRESSION: 1. Patchy bilateral ground-glass opacities and consolidations, most likely multifocal pneumonia. Differential for the ground-glass components includes atypical pneumonias such as viral or fungal, interstitial pneumonias, edema related to volume overload/CHF, chronic interstitial diseases, hypersensitivity pneumonitis, and respiratory bronchiolitis. [id number] pneumonia can certainly have this appearance. 2. Small bilateral pleural effusions. 3. Mildly prominent lymph nodes within the mediastinum and bilateral perihilar regions, likely reactive in nature. 4. Cardiomegaly. 5. Small hiatal hernia. Aortic Atherosclerosis (ICD10-I70.0). Electronically Signed   By: Franki Cabot M.D.   On: 10/30/2020 11:17    Scheduled Meds: . amiodarone  200 mg Oral Daily  . apixaban  5 mg Oral BID  . busPIRone  5 mg Oral  BID  . furosemide  20 mg Oral Daily  . ipratropium-albuterol  3 mL Nebulization Q6H  . [START ON 11/02/2020] levofloxacin  750 mg Oral Q48H  . levothyroxine  137 mcg Oral Q0600  . methylPREDNISolone (SOLU-MEDROL) injection  40 mg Intravenous Daily  . metoprolol succinate  50 mg Oral Daily  . potassium chloride  10 mEq Oral Daily  . rosuvastatin  40 mg Oral QHS  . sodium chloride flush  3 mL Intravenous Q12H   Continuous Infusions: . sodium chloride      Assessment/Plan:  1. Severe sepsis, present on admission with bilateral multifocal pneumonia, leukocytosis, tachycardia and tachypnea.  Patient also had acute hypoxic respiratory failure with a pulse ox of 90% on 6 L of oxygen on presentation.  Patient is on p.o. Levaquin.  Add Solu-Medrol and nebulizer treatments since not moving good air. 2. Acute hypoxic  respiratory failure.  Pulse ox of 90% on 6 L on presentation.  The patient is just under 3 L this morning.  Check a pulse ox on room air.  Add Solu-Medrol nebulizers to get better air entry.  Incentive spirometer. 3. Paroxysmal atrial fibrillation on Eliquis for anticoagulation amiodarone and metoprolol for rate control. 4. 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  Stroke risk is less being on Eliquis. 5. Hyperlipidemia unspecified on Crestor 6. Hypothyroidism unspecified on levothyroxine 7. Anxiety on BuSpar 8. Physical therapy evaluate 9. Chronic combined systolic and diastolic heart failure.  On metoprolol and p.o. Lasix.    Code Status:     Code Status Orders  (From admission, onward)         Start     Ordered   10/30/20 2332  Full code  Continuous        10/30/20 2332        Code Status History    Date Active Date Inactive Code Status Order ID Comments User Context   10/30/2020 1044 10/30/2020 2332 DNR [id number]  [name] Edinger, MD ED   03/29/2020 1729 03/31/2020 1640 DNR [id number]  Ivor Costa, MD ED   03/29/2020 1628 03/29/2020 1729 Full Code [id number]  Ivor Costa, MD ED   06/26/2018 1345 06/28/2018 1727 DNR [id number]  Rolley Sims, RN Inpatient   06/26/2018 1149 06/26/2018 1345 Full Code [id number]  Fritzi Mandes, MD Inpatient   04/22/2018 0825 04/25/2018 1518 Full Code [id number]  Demetrios Loll, MD Inpatient   03/25/2018 0740 03/27/2018 1621 Full Code [id number]  Epifanio Lesches, MD ED   Advance Care Planning Activity    Advance Directive Documentation   Flowsheet Row Most Recent Value  Type of Advance Directive Living will, Healthcare Power of Attorney  Pre-existing out of facility DNR order (yellow form or pink MOST form) --  "MOST" Form in Place? --     Family Communication: Spoke with daughter at the bedside Disposition Plan: Status is: Inpatient  Dispo: The patient is from: Home              Anticipated d/c is to: Home              Anticipated d/c date  is: May need a few days here in the hospital.  Would like to get off oxygen prior to disposition              Patient currently started on IV Solu-Medrol.  Patient still on oxygen and does not wear oxygen at home.  Would like to get off oxygen prior to disposition.  Time spent: 28 minutes  [place]  [facility] [employer]

## 2020-10-31 NOTE — Plan of Care (Signed)
  Problem: Activity: Goal: Ability to tolerate increased activity will improve Outcome: Progressing   Problem: Clinical Measurements: Goal: Ability to maintain a body temperature in the normal range will improve Outcome: Progressing   Problem: Respiratory: Goal: Ability to maintain adequate ventilation will improve Outcome: Progressing Goal: Ability to maintain a clear airway will improve Outcome: Progressing   

## 2020-10-31 NOTE — Progress Notes (Signed)
PHARMACY NOTE:  ANTIMICROBIAL RENAL DOSAGE ADJUSTMENT  Current antimicrobial regimen includes a mismatch between antimicrobial dosage and estimated renal function.  As per policy approved by the Pharmacy & Therapeutics and Medical Executive Committees, the antimicrobial dosage will be adjusted accordingly.  Current antimicrobial dosage:  Levofloxacin 750mg  every 24 hours  Indication: CAP  Renal Function: Estimated Creatinine Clearance: 47.8 mL/min (by C-G formula based on SCr of 0.88 mg/dL).  Antimicrobial dosage has been changed to:  Levofloxacin 750mg  every 48 hours for CrCl <49ml/min    Thank you for allowing pharmacy to be a part of this patient's care.  Pernell Dupre, PharmD, BCPS Clinical Pharmacist 10/31/2020 8:56 AM

## 2020-11-01 DIAGNOSIS — I5043 Acute on chronic combined systolic (congestive) and diastolic (congestive) heart failure: Secondary | ICD-10-CM

## 2020-11-01 LAB — BASIC METABOLIC PANEL
Anion gap: 10 (ref 5–15)
BUN: 22 mg/dL (ref 8–23)
CO2: 23 mmol/L (ref 22–32)
Calcium: 9.1 mg/dL (ref 8.9–10.3)
Chloride: 105 mmol/L (ref 98–111)
Creatinine, Ser: 1.05 mg/dL — ABNORMAL HIGH (ref 0.44–1.00)
GFR, Estimated: 54 mL/min — ABNORMAL LOW (ref >60)
Glucose, Bld: 168 mg/dL — ABNORMAL HIGH (ref 70–99)
Potassium: 3.9 mmol/L (ref 3.5–5.1)
Sodium: 138 mmol/L (ref 135–145)

## 2020-11-01 LAB — LEGIONELLA PNEUMOPHILA SEROGP 1 UR AG: L. pneumophila Serogp 1 Ur Ag: NEGATIVE

## 2020-11-01 NOTE — TOC Initial Note (Signed)
Transition of Care [facility]) - Initial/Assessment Note    Patient Details  Name: [name] MRN: [id number] Date of Birth: 1941-08-09  Transition of Care [facility]) CM/SW Contact:    Magnus Ivan, LCSW Phone Number: 11/01/2020, 11:21 AM  Clinical Narrative:                CSW met with patient and daughter at bedside. Patient lives alone and usually drives herself to appointments. Her boyfriend drives her if it is a long distance. PCP is Dr. Ancil Boozer. Pharmacy is [facility] in [place]. No DME, HH, or SNF history. Explained PT recommendation for RW. Patient agreeable to RW, CSW ordered through [employer]. Daughter asking about cane, explained to family they may have to private pay for a cane (per DME company insurance would only cover the RW), daughter reported she is fine doing this if needed.   Expected Discharge Plan: Home/Self Care Barriers to Discharge: Continued Medical Work up   Patient Goals and CMS Choice Patient states their goals for this hospitalization and ongoing recovery are:: to return home with self care CMS Medicare.gov Compare Post Acute Care list provided to:: Patient Choice offered to / list presented to : [place]  Expected Discharge Plan and Services Expected Discharge Plan: Home/Self Care       Living arrangements for the past 2 months: Single Family Home                 DME Arranged: Walker rolling DME Agency: [employer] Date DME Agency Contacted: 11/01/20   Representative spoke with at DME Agency: Nash Shearer            Prior Living Arrangements/Services Living arrangements for the past 2 months: [place] Lives with:: Self Patient language and need for interpreter reviewed:: Yes Do you feel safe going back to the place where you live?: Yes      Need for Family Participation in Patient Care: Yes (Comment) Care giver support system in place?: Yes (comment)   Criminal Activity/Legal Involvement Pertinent  to Current Situation/Hospitalization: No - Comment as needed  Activities of Daily Living Home Assistive Devices/Equipment: Blood pressure cuff,Scales ADL Screening (condition at time of admission) Patient's cognitive ability adequate to safely complete daily activities?: Yes Is the patient deaf or have difficulty hearing?: Yes Does the patient have difficulty seeing, even when wearing glasses/contacts?: No Does the patient have difficulty concentrating, remembering, or making decisions?: No Patient able to express need for assistance with ADLs?: Yes Does the patient have difficulty dressing or bathing?: No Independently performs ADLs?: Yes (appropriate for developmental age) Does the patient have difficulty walking or climbing stairs?: Yes Weakness of Legs: Both Weakness of Arms/Hands: None  Permission Sought/Granted Permission sought to share information with : Facility Retail banker granted to share information with : Yes, Verbal Permission Granted     Permission granted to share info w AGENCY: DME agencies  Permission granted to share info w Relationship: daughter     Emotional Assessment       Orientation: : Oriented to Self,Oriented to Place,Oriented to  Time,Oriented to Situation Alcohol / Substance Use: Not Applicable Psych Involvement: No (comment)  Admission diagnosis:  Hypokalemia [E87.6] CAP (community acquired pneumonia) [J18.9] Acute respiratory failure with hypoxia ([place]) [J96.01] Community acquired pneumonia, unspecified laterality [J18.9] Person under investigation for COVID-19 [Z20.822] Patient Active Problem List   Diagnosis Date Noted  . Severe sepsis ([place])   . Acquired thrombophilia ([place])   . Community acquired pneumonia  10/30/2020  . Acute respiratory failure ([place]) 10/30/2020  . Multifocal pneumonia 10/30/2020  . Pyelonephritis 03/30/2020  . Acute pyelonephritis 03/29/2020  . Hydronephrosis of right kidney  03/29/2020  . Depression 03/29/2020  . Chronic heart failure ([place]) 05/24/2018  . AF (paroxysmal atrial fibrillation) ([place]) 05/24/2018  . CKD (chronic kidney disease), stage III ([place]) 05/17/2018  . 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 Shortness of breath 05/07/2018  . Pulmonary hypertension, unspecified ([place]) 04/05/2018  . Chronic combined systolic (congestive) and diastolic (congestive) heart failure ([place]) 04/05/2018  . Paroxysmal sinus tachycardia ([place]) 01/29/2018  . Leg pain 01/04/2018  . PAD (peripheral artery disease) ([place]) 01/04/2018  . Carotid artery calcification, bilateral 12/19/2017  . Cervical radiculitis 12/19/2017  . Hyperglycemia 03/06/2016  . Hypothyroid 04/21/2015  . 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 Anxiety 04/21/2015  . Hyperlipidemia 04/21/2015  . Diuretic-induced hypokalemia 04/21/2015  . Palpitations 12/18/2012  . Hypertension 05/27/2012   PCP:  Steele Sizer, MD Pharmacy:   [facility] [phone] - [address], [address] [place] [address] Phone: [phone] Fax: [phone]     Social Determinants of Health (SDOH) Interventions    Readmission Risk Interventions No flowsheet data found.

## 2020-11-01 NOTE — Progress Notes (Signed)
Patient ID: [name], female   DOB: 01-May-1941, 80 y.o.   MRN: [id number] [facility] PROGRESS NOTE  [name] [id number]:[id number] DOB: 09-Nov-1940 DOA: 10/30/2020 PCP: Steele Sizer, MD  HPI/Subjective: Patient feels better today than yesterday.  Breathing better.  Still with some cough.  Still with some shortness of breath.  Admitted with severe sepsis.  Objective: Vitals:   11/01/20 0451 11/01/20 0750  BP: (!) 118/44 (!) 122/55  Pulse: 68 64  Resp: 16 16  Temp: 97.6 F (36.4 C) 98.7 F (37.1 C)  SpO2: 97% 94%    Intake/Output Summary (Last 24 hours) at 11/01/2020 1124 Last data filed at 11/01/2020 1057 Gross per 24 hour  Intake 363 ml  Output 100 ml  Net 263 ml   Filed Weights   10/30/20 2034 10/31/20 0556 11/01/20 0524  Weight: 57.5 kg 57.5 kg 58.5 kg    ROS: Review of Systems  Respiratory: Positive for cough and shortness of breath.   Cardiovascular: Negative for chest pain.  Gastrointestinal: Negative for abdominal pain, nausea and vomiting.   Exam: Physical Exam HENT:     Head: Normocephalic.     Mouth/Throat:     Pharynx: No oropharyngeal exudate.  Eyes:     General: Lids are normal.     Conjunctiva/sclera: Conjunctivae normal.     Pupils: Pupils are equal, round, and reactive to light.  Cardiovascular:     Rate and Rhythm: Normal rate and regular rhythm.     Heart sounds: Normal heart sounds, S1 normal and S2 normal.  Pulmonary:     Breath sounds: Examination of the right-lower field reveals decreased breath sounds and rhonchi. Examination of the left-lower field reveals decreased breath sounds and rhonchi. Decreased breath sounds and rhonchi present. No wheezing or rales.  Abdominal:     Palpations: Abdomen is soft.     Tenderness: There is no abdominal tenderness.  Musculoskeletal:     Right ankle: No swelling.     Left ankle: No swelling.  Skin:    General: Skin is warm.     Findings: No rash.  Neurological:     Mental Status: She is  alert and oriented to person, place, and time.       Data Reviewed: Basic Metabolic Panel: Recent Labs  Lab 10/30/20 0403 10/31/20 0456 11/01/20 0252  NA 136 137 138  K 3.0* 4.0 3.9  CL 103 104 105  CO2 22 23 23   GLUCOSE 154* 114* 168*  BUN 16 16 22   CREATININE 0.93 0.88 1.05*  CALCIUM 8.4* 9.0 9.1  MG 2.0  --   --    Liver Function Tests: Recent Labs  Lab 10/30/20 0403  AST 47*  ALT 42  ALKPHOS 80  BILITOT 0.9  PROT 6.8  ALBUMIN 3.1*   CBC: Recent Labs  Lab 10/30/20 0403 10/31/20 0456  WBC 13.7* 13.1*  NEUTROABS 12.0*  --   HGB 10.3* 11.4*  HCT 30.3* 32.6*  MCV 93.5 91.8  PLT 279 318   BNP (last 3 results) Recent Labs    03/29/20 1422 10/30/20 0403  BNP 169.2* 179.6*     Recent Results (from the past 240 hour(s))  Blood Culture (routine x 2)     Status: None (Preliminary result)   Collection Time: 10/30/20  4:45 AM   Specimen: BLOOD  Result Value Ref Range Status   Specimen Description BLOOD LEFT ANTECUBITAL  Final   Special Requests   Final    BOTTLES DRAWN AEROBIC AND  ANAEROBIC Blood Culture adequate volume   Culture   Final    NO GROWTH 2 DAYS Performed at [facility], [address] [place], [place] [address]    Report Status PENDING  Incomplete  Blood Culture (routine x 2)     Status: None (Preliminary result)   Collection Time: 10/30/20  5:00 AM   Specimen: BLOOD  Result Value Ref Range Status   Specimen Description BLOOD RIGHT ANTECUBITAL  Final   Special Requests   Final    BOTTLES DRAWN AEROBIC AND ANAEROBIC Blood Culture adequate volume   Culture   Final    NO GROWTH 2 DAYS Performed at [facility], [address] [place], [place] [address]    Report Status PENDING  Incomplete  Resp Panel by RT-PCR (Flu A&B, Covid) Nasopharyngeal Swab     Status: None   Collection Time: 10/30/20  6:35 AM   Specimen: Nasopharyngeal Swab; Nasopharyngeal(NP) swabs in vial transport medium  Result Value Ref Range Status    SARS Coronavirus 2 by RT PCR NEGATIVE NEGATIVE Final    Comment: (NOTE) SARS-CoV-2 target nucleic acids are NOT DETECTED.  The SARS-CoV-2 RNA is generally detectable in upper respiratory specimens during the acute phase of infection. The lowest concentration of SARS-CoV-2 viral copies this assay can detect is 138 copies/mL. A negative result does not preclude SARS-Cov-2 infection and should not be used as the sole basis for treatment or other patient management decisions. A negative result may occur with  improper specimen collection/handling, submission of specimen other than nasopharyngeal swab, presence of viral mutation(s) within the areas targeted by this assay, and inadequate number of viral copies(<138 copies/mL). A negative result must be combined with clinical observations, patient history, and epidemiological information. The expected result is Negative.  Fact Sheet for Patients:  EntrepreneurPulse.com.au  Fact Sheet for Healthcare Providers:  IncredibleEmployment.be  This test is no t yet approved or cleared by the [place] FDA and  has been authorized for detection and/or diagnosis of SARS-CoV-2 by FDA under an Emergency Use Authorization (EUA). This EUA will remain  in effect (meaning this test can be used) for the duration of the COVID-19 declaration under Section 564(b)(1) of the Act, 21 U.S.C.section 360bbb-3(b)(1), unless the authorization is terminated  or revoked sooner.       Influenza A by PCR NEGATIVE NEGATIVE Final   Influenza B by PCR NEGATIVE NEGATIVE Final    Comment: (NOTE) The Xpert Xpress SARS-CoV-2/FLU/RSV plus assay is intended as an aid in the diagnosis of influenza from Nasopharyngeal swab specimens and should not be used as a sole basis for treatment. Nasal washings and aspirates are unacceptable for Xpert Xpress SARS-CoV-2/FLU/RSV testing.  Fact Sheet for  Patients: EntrepreneurPulse.com.au  Fact Sheet for Healthcare Providers: IncredibleEmployment.be  This test is not yet approved or cleared by the [place] FDA and has been authorized for detection and/or diagnosis of SARS-CoV-2 by FDA under an Emergency Use Authorization (EUA). This EUA will remain in effect (meaning this test can be used) for the duration of the COVID-19 declaration under Section 564(b)(1) of the Act, 21 U.S.C. section 360bbb-3(b)(1), unless the authorization is terminated or revoked.  Performed at [facility], [place], [place] [address]   SARS CORONAVIRUS 2 (TAT 6-24 HRS) Nasopharyngeal Nasopharyngeal Swab     Status: None   Collection Time: 10/30/20  2:13 PM   Specimen: Nasopharyngeal Swab  Result Value Ref Range Status   SARS Coronavirus 2 NEGATIVE NEGATIVE Final  Comment: (NOTE) SARS-CoV-2 target nucleic acids are NOT DETECTED.  The SARS-CoV-2 RNA is generally detectable in upper and lower respiratory specimens during the acute phase of infection. Negative results do not preclude SARS-CoV-2 infection, do not rule out co-infections with other pathogens, and should not be used as the sole basis for treatment or other patient management decisions. Negative results must be combined with clinical observations, patient history, and epidemiological information. The expected result is Negative.  Fact Sheet for Patients: SugarRoll.be  Fact Sheet for Healthcare Providers: https://www.woods-mathews.com/  This test is not yet approved or cleared by the [place] FDA and  has been authorized for detection and/or diagnosis of SARS-CoV-2 by FDA under an Emergency Use Authorization (EUA). This EUA will remain  in effect (meaning this test can be used) for the duration of the COVID-19 declaration under Se ction 564(b)(1) of the Act, 21 U.S.C. section  360bbb-3(b)(1), unless the authorization is terminated or revoked sooner.  Performed at [facility], [place] [address], [place], [place] [address]      Studies: ECHOCARDIOGRAM COMPLETE  Result Date: 10/31/2020    ECHOCARDIOGRAM REPORT   Patient Name:   ANGELE GAVIA Date of Exam: 10/31/2020 Medical Rec #:  [id number]      Height:       66.0 in Accession #:    [id number]     Weight:       126.8 lb Date of Birth:  12-02-1940     BSA:          1.648 m Patient Age:    89 years       BP:           135/43 mmHg Patient Gender: F              HR:           79 bpm. Exam Location:  [facility] Procedure: 2D Echo Indications:     DYSPNEA R06.00  History:         Patient has prior history of Echocardiogram examinations, most                  recent 03/21/2018. Arrythmias:Atrial Fibrillation,                  Signs/Symptoms:Shortness of Breath; Risk Factors:Hypertension                  and Dyslipidemia.  Sonographer:     Avanell Shackleton Referring Phys:  [id number] Loletha Grayer Diagnosing Phys: Ida Rogue MD IMPRESSIONS  1. Left ventricular ejection fraction, by estimation, is 60 to 65%. The left ventricle has normal function. The left ventricle has no regional wall motion abnormalities. Left ventricular diastolic parameters are consistent with Grade I diastolic dysfunction (impaired relaxation).  2. Right ventricular systolic function is normal. The right ventricular size is normal. There is moderately elevated pulmonary artery systolic pressure. The estimated right ventricular systolic pressure is [id number] mmHg.  3. The mitral valve is normal in structure. Mild to moderate mitral valve regurgitation. Two jets. No evidence of mitral stenosis. FINDINGS  Left Ventricle: Left ventricular ejection fraction, by estimation, is 60 to 65%. The left ventricle has normal function. The left ventricle has no regional wall motion abnormalities. The left ventricular internal cavity size was normal in size. There is  no left ventricular  hypertrophy. Left ventricular diastolic parameters are consistent with Grade I diastolic dysfunction (impaired relaxation). Right Ventricle: The right ventricular size is normal. No increase in right  ventricular wall thickness. Right ventricular systolic function is normal. There is moderately elevated pulmonary artery systolic pressure. The tricuspid regurgitant velocity is 3.53 m/s, and with an assumed right atrial pressure of 5 mmHg, the estimated right ventricular systolic pressure is [id number] mmHg. Left Atrium: Left atrial size was normal in size. Right Atrium: Right atrial size was normal in size. Pericardium: There is no evidence of pericardial effusion. Mitral Valve: The mitral valve is normal in structure. Mild to moderate mitral valve regurgitation. No evidence of mitral valve stenosis. Tricuspid Valve: The tricuspid valve is normal in structure. Tricuspid valve regurgitation is mild . No evidence of tricuspid stenosis. Aortic Valve: The aortic valve is normal in structure. Aortic valve regurgitation is not visualized. Mild aortic valve sclerosis is present, with no evidence of aortic valve stenosis. Pulmonic Valve: The pulmonic valve was normal in structure. Pulmonic valve regurgitation is not visualized. No evidence of pulmonic stenosis. Aorta: The aortic root is normal in size and structure. Venous: The inferior vena cava is normal in size with greater than 50% respiratory variability, suggesting right atrial pressure of 3 mmHg. IAS/Shunts: No atrial level shunt detected by color flow Doppler.  LEFT VENTRICLE PLAX 2D LVIDd:         5.33 cm  Diastology LVIDs:         3.04 cm  LV e' medial:    5.00 cm/s LV PW:         0.76 cm  LV E/e' medial:  16.2 LV IVS:        0.95 cm  LV e' lateral:   5.33 cm/s LVOT diam:     2.00 cm  LV E/e' lateral: 15.2 LVOT Area:     3.14 cm  RIGHT VENTRICLE             IVC RV S prime:     13.10 cm/s  IVC diam: 1.68 cm LEFT ATRIUM             Index       RIGHT ATRIUM           Index  LA diam:        4.50 cm 2.73 cm/m  RA Area:     21.20 cm LA Vol (A2C):   79.5 ml 48.25 ml/m RA Volume:   62.30 ml  37.81 ml/m LA Vol (A4C):   53.7 ml 32.59 ml/m LA Biplane Vol: 66.0 ml 40.06 ml/m   AORTA Ao Root diam: 2.70 cm MITRAL VALVE               TRICUSPID VALVE MV Area (PHT): 4.06 cm    TR Peak grad:   49.8 mmHg MV Decel Time: 187 msec    TR Vmax:        353.00 cm/s MV E velocity: 81.00 cm/s MV A velocity: 97.50 cm/s  SHUNTS MV E/A ratio:  0.83        Systemic Diam: 2.00 cm Ida Rogue MD Electronically signed by Ida Rogue MD Signature Date/Time: 10/31/2020/5:54:20 PM    Final     Scheduled Meds: . amiodarone  200 mg Oral Daily  . apixaban  5 mg Oral BID  . busPIRone  5 mg Oral BID  . furosemide  20 mg Oral Daily  . ipratropium-albuterol  3 mL Nebulization Q6H  . [START ON 11/02/2020] levofloxacin  750 mg Oral Q48H  . levothyroxine  137 mcg Oral Q0600  . methylPREDNISolone (SOLU-MEDROL) injection  40 mg Intravenous Daily  . metoprolol succinate  50 mg Oral Daily  . potassium chloride  10 mEq Oral Daily  . rosuvastatin  40 mg Oral QHS  . sodium chloride flush  3 mL Intravenous Q12H   Continuous Infusions: . sodium chloride      Assessment/Plan:  1. Severe sepsis, present on admission with bilateral multifocal pneumonia, leukocytosis, tachycardia and tachypnea.  Patient also had acute hypoxic respiratory failure with a pulse ox of 90% on 6 L of oxygen on presentation.  Continue oral Levaquin.  Solu-Medrol and nebulizer treatments added yesterday.  Patient moving better air today.  Still hearing some noises at the bases.   2. Acute hypoxic respiratory failure.  Pulse ox of 90% on 6 L of oxygen on presentation.  Off oxygen today.  Continue incentive spirometer.  This has resolved. 3. Paroxysmal atrial fibrillation.  Continue Eliquis for anticoagulation.  Amiodarone and metoprolol for rate control. 4. 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  Stroke  risk is less being on Eliquis. 5. Hyperlipidemia unspecified.  Continue Crestor 6. Hypothyroidism unspecified on levothyroxine 7. Anxiety.  On BuSpar 8. Chronic combined systolic and diastolic heart failure on metoprolol and Lasix.        Code Status:     Code Status Orders  (From admission, onward)         Start     Ordered   10/30/20 2332  Full code  Continuous        10/30/20 2332        Code Status History    Date Active Date Inactive Code Status Order ID Comments User Context   10/30/2020 1044 10/30/2020 2332 DNR [id number]  [name] Edinger, MD ED   03/29/2020 1729 03/31/2020 1640 DNR [id number]  Ivor Costa, MD ED   03/29/2020 1628 03/29/2020 1729 Full Code [id number]  Ivor Costa, MD ED   06/26/2018 1345 06/28/2018 1727 DNR [id number]  Rolley Sims, RN Inpatient   06/26/2018 1149 06/26/2018 1345 Full Code [id number]  Fritzi Mandes, MD Inpatient   04/22/2018 0825 04/25/2018 1518 Full Code [id number]  Demetrios Loll, MD Inpatient   03/25/2018 0740 03/27/2018 1621 Full Code [id number]  Epifanio Lesches, MD ED   Advance Care Planning Activity    Advance Directive Documentation   Flowsheet Row Most Recent Value  Type of Advance Directive Living will, Healthcare Power of Attorney  Pre-existing out of facility DNR order (yellow form or pink MOST form) --  "MOST" Form in Place? --     Family Communication: Daughter at bedside Disposition Plan: Status is: Inpatient  Dispo: The patient is from: Home              Anticipated d/c is to: Home              Anticipated d/c date is: 11/02/2020              Patient currently still requiring IV Solu-Medrol for better air entry.  Time spent: 28 minutes  [place]

## 2020-11-02 DIAGNOSIS — I5032 Chronic diastolic (congestive) heart failure: Secondary | ICD-10-CM

## 2020-11-02 MED ORDER — IPRATROPIUM-ALBUTEROL 0.5-2.5 (3) MG/3ML IN SOLN
3.0000 mL | Freq: Three times a day (TID) | RESPIRATORY_TRACT | Status: DC
  Administered 2020-11-02: 3 mL via RESPIRATORY_TRACT
  Filled 2020-11-02: qty 3

## 2020-11-02 MED ORDER — PREDNISONE 20 MG PO TABS: ORAL_TABLET | ORAL | 0 refills | Status: AC

## 2020-11-02 MED ORDER — ALBUTEROL SULFATE HFA 108 (90 BASE) MCG/ACT IN AERS: 2.0000 | INHALATION_SPRAY | Freq: Four times a day (QID) | RESPIRATORY_TRACT | 0 refills | Status: DC | PRN

## 2020-11-02 MED ORDER — LEVOFLOXACIN 750 MG PO TABS: 750.0000 mg | ORAL_TABLET | ORAL | 0 refills | Status: AC

## 2020-11-02 NOTE — Discharge Summary (Signed)
[facility] NAME: [name]    MR#:  [id number]  DATE OF BIRTH:  [birth year]  DATE OF ADMISSION:  10/30/2020 ADMITTING PHYSICIAN: [name] Edinger, MD  DATE OF DISCHARGE: 11/02/2020 11:45 AM  PRIMARY CARE PHYSICIAN: Steele Sizer, MD    ADMISSION DIAGNOSIS:  Hypokalemia [E87.6] CAP (community acquired pneumonia) [J18.9] Acute respiratory failure with hypoxia ([place]) [J96.01] Community acquired pneumonia, unspecified laterality [J18.9] Person under investigation for COVID-19 [Z20.822]  DISCHARGE DIAGNOSIS:  Principal Problem:   Community acquired pneumonia Active Problems:   Hypertension   Hypothyroid   Pulmonary hypertension, unspecified ([place])   Acute on chronic combined systolic and diastolic CHF (congestive heart failure) (HCC)   AF (paroxysmal atrial fibrillation) (HCC)   Depression   Acute respiratory failure (HCC)   Multifocal pneumonia   Severe sepsis ([place])   Acquired thrombophilia ([place])   SECONDARY DIAGNOSIS:   Past Medical History:  Diagnosis Date  . Anxiety   . Bronchitis   . Chronic combined systolic (congestive) and diastolic (congestive) heart failure ([place])    a. 02/2018 Echo: EF 45-50%, diff HK, triv AI, mod MR, mildly dil LA/RA, nl RV fxn, mod TR. PASP 40-74mmHg.  Marland Kitchen Chronic kidney disease   . DJD (degenerative joint disease), cervical   . History of stress test    a. 01/2008 MV: EF 76%. Fair ex tol. No ischemia.  . Hyperlipidemia   . Hypertension   . Hypothyroid   . LBBB (left bundle branch block)   . Leg pain    a. 01/2018 ABI's wnl.  . Lumbar spondylosis   . Persistent atrial fibrillation ([place])    a. 01/2018 Event monitor: 45 runs of SVT, longest 14.5 sec. SVT felt to be Afib/flutter-->2% burden. Longest run of AF 4h 69m (CHA2DS2VASc = 5-->Xarelto); b. 03/2019 s/p DCCV; c. 04/2019 Repeat Amio load and DCCV.  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 severe sepsis, present on admission with bilateral multifocal pneumonia, leukocytosis, tachycardia and tachypnea.  The patient also had acute hypoxic respiratory failure with a pulse ox of 90% on 6 L of oxygen on presentation.  Because of drug allergies patient was started on oral Levaquin.  I added Solu-Medrol and nebulizer treatments.  Patient has improved and ready to go home.  Patient will be given 2 more days of prednisone upon going home and Levaquin is dosed every 48 hours so 2 more pills of Levaquin 2.  Acute hypoxic respiratory failure.  Pulse ox of 90% on 6 L of oxygen on presentation.  The patient is off oxygen as of yesterday.  Continue incentive spirometry.  Acute hypoxic respiratory failure has resolved. 3.  Paroxysmal atrial fibrillation.  Continue Eliquis for anticoagulation.  Amiodarone and metoprolol for rate control. 4.  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  Stroke risk is less being on Eliquis. 5.  Hyperlipidemia unspecified on Crestor 6.  Hypothyroidism unspecified on levothyroxine 7.  Chronic combined systolic and diastolic heart failure on metoprolol and Lasix.  Looking at last echocardiogram on 10/31/2020 EF has normalized.  Holding Diovan at this time secondary to lower blood pressure on presentation. 8.  Essential hypertension.  Discontinue Norvasc and Diovan at this time. 9.  Anxiety on BuSpar  DISCHARGE CONDITIONS:   Satisfactory  CONSULTS OBTAINED:  None  DRUG ALLERGIES:   Allergies  Allergen Reactions  . Chocolate Swelling  .  Penicillins Swelling and Rash    Has patient had a PCN reaction causing immediate rash, facial/tongue/throat swelling, SOB or lightheadedness with hypotension: Yes Has patient had a PCN reaction causing severe rash involving mucus membranes or skin necrosis: No Has patient had a PCN reaction that required hospitalization: No Has patient had a PCN reaction occurring within the last  10 years: No If all of the above answers are "NO", then may proceed with Cephalosporin use.   . Codeine Nausea And Vomiting  . Erythromycin Itching and Swelling  . Septra [Sulfamethoxazole-Trimethoprim] Swelling    DISCHARGE MEDICATIONS:   Allergies as of 11/02/2020      Reactions   Chocolate Swelling   Penicillins Swelling, Rash   Has patient had a PCN reaction causing immediate rash, facial/tongue/throat swelling, SOB or lightheadedness with hypotension: Yes Has patient had a PCN reaction causing severe rash involving mucus membranes or skin necrosis: No Has patient had a PCN reaction that required hospitalization: No Has patient had a PCN reaction occurring within the last 10 years: No If all of the above answers are "NO", then may proceed with Cephalosporin use.   Codeine Nausea And Vomiting   Erythromycin Itching, Swelling   Septra [sulfamethoxazole-trimethoprim] Swelling      Medication List    STOP taking these medications   amLODipine 10 MG tablet Commonly known as: NORVASC   valsartan 320 MG tablet Commonly known as: DIOVAN     TAKE these medications   acetaminophen 650 MG CR tablet Commonly known as: TYLENOL Take 650-1,300 mg by mouth every 8 (eight) hours as needed for pain.   albuterol 108 (90 Base) MCG/ACT inhaler Commonly known as: VENTOLIN HFA Inhale 2 puffs into the lungs every 6 (six) hours as needed for wheezing or shortness of breath.   amiodarone 200 MG tablet Commonly known as: PACERONE TAKE 1 TABLET(200 MG) BY MOUTH DAILY   busPIRone 5 MG tablet Commonly known as: BUSPAR Take 1 tablet (5 mg total) by mouth 2 (two) times daily.   cholecalciferol 1000 units tablet Commonly known as: VITAMIN D Take 2,000 Units by mouth daily.   Cranberry 500 MG Tabs Take 2 tablets by mouth daily.   doxazosin 1 MG tablet Commonly known as: CARDURA TAKE 1 TABLET(1 MG) BY MOUTH TWICE DAILY What changed: See the new instructions.   Eliquis 5 MG Tabs  tablet Generic drug: apixaban TAKE 1 TABLET(5 MG) BY MOUTH TWICE DAILY What changed: See the new instructions.   furosemide 20 MG tablet Commonly known as: LASIX Take 20 mg by mouth See admin instructions. Take 1 tablet (20mg ) by mouth every morning - take 1 additional tablet (20mg ) after lunch if needed for swelling   levofloxacin 750 MG tablet Commonly known as: LEVAQUIN Take 1 tablet (750 mg total) by mouth every other day for 2 days. Start taking on: November 04, 2020   levothyroxine 137 MCG tablet Commonly known as: SYNTHROID TAKE 1 TABLET(137 MCG) BY MOUTH EVERY MORNING   metoprolol succinate 50 MG 24 hr tablet Commonly known as: TOPROL-XL Take with or immediately following a meal.   multivitamin with minerals Tabs tablet Take 1 tablet by mouth daily.   potassium chloride 10 MEQ tablet Commonly known as: KLOR-CON TAKE 1 TABLET BY MOUTH EVERY DAY AS DIRECTED. MAY TAKE AN EXTRA PILL AFTER LUNCH WITH FLUID PILL AS NEEDED FOR SWELLING   predniSONE 20 MG tablet Commonly known as: DELTASONE Two tabs po for two more days Start taking on: November 03, 2020  rosuvastatin 40 MG tablet Commonly known as: CRESTOR TAKE 1 TABLET(40 MG) BY MOUTH DAILY What changed: See the new instructions.   temazepam 15 MG capsule Commonly known as: RESTORIL TAKE 1 CAPSULE(15 MG) BY MOUTH AT BEDTIME AS NEEDED FOR SLEEP            Durable Medical Equipment  (From admission, onward)         Start     Ordered   11/01/20 1122  For home use only DME Walker rolling  Once       Question Answer Comment  Walker: With 5 Inch Wheels   Patient needs a walker to treat with the following condition Unsteady gait when walking      11/01/20 1121           DISCHARGE INSTRUCTIONS:  Follow-up PMD 5 days Has appointment with cardiology on Friday  If you experience worsening of your admission symptoms, develop shortness of breath, life threatening emergency, suicidal or homicidal thoughts you  must seek medical attention immediately by calling 911 or calling your MD immediately  if symptoms less severe.  You Must read complete instructions/literature along with all the possible adverse reactions/side effects for all the Medicines you take and that have been prescribed to you. Take any new Medicines after you have completely understood and accept all the possible adverse reactions/side effects.   Please note  You were cared for by a hospitalist during your hospital stay. If you have any questions about your discharge medications or the care you received while you were in the hospital after you are discharged, you can call the unit and asked to speak with the hospitalist on call if the hospitalist that took care of you is not available. Once you are discharged, your primary care physician will handle any further medical issues. Please note that NO REFILLS for any discharge medications will be authorized once you are discharged, as it is imperative that you return to your primary care physician (or establish a relationship with a primary care physician if you do not have one) for your aftercare needs so that they can reassess your need for medications and monitor your lab values.    Today   CHIEF COMPLAINT:   Chief Complaint  Patient presents with  . Shortness of Breath    HISTORY OF PRESENT ILLNESS:  [name]  is a 80 y.o. female came in with shortness of breath   VITAL SIGNS:  Blood pressure (!) 135/54, pulse 64, temperature 98 F (36.7 C), resp. rate 15, height 5\' 6"  (1.676 m), weight 59.2 kg, SpO2 100 %.   PHYSICAL EXAMINATION:  GENERAL:  80 y.o.-year-old patient lying in the bed with no acute distress.  EYES: Pupils equal, round, reactive to light and accommodation. No scleral icterus. Extraocular muscles intact.  HEENT: Head atraumatic, normocephalic. Oropharynx and nasopharynx clear.  NECK:  Supple, no jugular venous distention. No thyroid enlargement, no  tenderness.  LUNGS: Decreased breath sounds bilateral bases, no wheezing, rales,rhonchi or crepitation. No use of accessory muscles of respiration.  CARDIOVASCULAR: S1, S2 normal. No murmurs, rubs, or gallops.  ABDOMEN: Soft, non-tender, non-distended. EXTREMITIES: No pedal edema.  NEUROLOGIC: Cranial nerves II through XII are intact. Muscle strength 5/5 in all extremities. Sensation intact. Gait not checked.  PSYCHIATRIC: The patient is alert.  SKIN: No obvious rash, lesion, or ulcer.   DATA REVIEW:   CBC Recent Labs  Lab 10/31/20 0456  WBC 13.1*  HGB 11.4*  HCT 32.6*  PLT  [place]  Lab 10/30/20 0403 10/31/20 0456 11/01/20 0252  NA 136   < > 138  K 3.0*   < > 3.9  CL 103   < > 105  CO2 22   < > 23  GLUCOSE 154*   < > 168*  BUN 16   < > 22  CREATININE 0.93   < > 1.05*  CALCIUM 8.4*   < > 9.1  MG 2.0  --   --   AST 47*  --   --   ALT 42  --   --   ALKPHOS 80  --   --   BILITOT 0.9  --   --    < > = values in this interval not displayed.     Microbiology Results  Results for orders placed or performed during the hospital encounter of 10/30/20  Blood Culture (routine x 2)     Status: None (Preliminary result)   Collection Time: 10/30/20  4:45 AM   Specimen: BLOOD  Result Value Ref Range Status   Specimen Description BLOOD LEFT ANTECUBITAL  Final   Special Requests   Final    BOTTLES DRAWN AEROBIC AND ANAEROBIC Blood Culture adequate volume   Culture   Final    NO GROWTH 3 DAYS Performed at [facility], [address] [place], [place] [address]    Report Status PENDING  Incomplete  Blood Culture (routine x 2)     Status: None (Preliminary result)   Collection Time: 10/30/20  5:00 AM   Specimen: BLOOD  Result Value Ref Range Status   Specimen Description BLOOD RIGHT ANTECUBITAL  Final   Special Requests   Final    BOTTLES DRAWN AEROBIC AND ANAEROBIC Blood Culture adequate volume   Culture   Final    NO GROWTH 3  DAYS Performed at [facility], [address], [place], [place] [address]    Report Status PENDING  Incomplete  Resp Panel by RT-PCR (Flu A&B, Covid) Nasopharyngeal Swab     Status: None   Collection Time: 10/30/20  6:35 AM   Specimen: Nasopharyngeal Swab; Nasopharyngeal(NP) swabs in vial transport medium  Result Value Ref Range Status   SARS Coronavirus 2 by RT PCR NEGATIVE NEGATIVE Final    Comment: (NOTE) SARS-CoV-2 target nucleic acids are NOT DETECTED.  The SARS-CoV-2 RNA is generally detectable in upper respiratory specimens during the acute phase of infection. The lowest concentration of SARS-CoV-2 viral copies this assay can detect is 138 copies/mL. A negative result does not preclude SARS-Cov-2 infection and should not be used as the sole basis for treatment or other patient management decisions. A negative result may occur with  improper specimen collection/handling, submission of specimen other than nasopharyngeal swab, presence of viral mutation(s) within the areas targeted by this assay, and inadequate number of viral copies(<138 copies/mL). A negative result must be combined with clinical observations, patient history, and epidemiological information. The expected result is Negative.  Fact Sheet for Patients:  EntrepreneurPulse.com.au  Fact Sheet for Healthcare Providers:  IncredibleEmployment.be  This test is no t yet approved or cleared by the [place] FDA and  has been authorized for detection and/or diagnosis of SARS-CoV-2 by FDA under an Emergency Use Authorization (EUA). This EUA will remain  in effect (meaning this test can be used) for the duration of the COVID-19 declaration under Section 564(b)(1) of the Act, 21 U.S.C.section 360bbb-3(b)(1), unless the authorization is terminated  or  revoked sooner.       Influenza A by PCR NEGATIVE NEGATIVE Final   Influenza B by PCR NEGATIVE NEGATIVE Final     Comment: (NOTE) The Xpert Xpress SARS-CoV-2/FLU/RSV plus assay is intended as an aid in the diagnosis of influenza from Nasopharyngeal swab specimens and should not be used as a sole basis for treatment. Nasal washings and aspirates are unacceptable for Xpert Xpress SARS-CoV-2/FLU/RSV testing.  Fact Sheet for Patients: EntrepreneurPulse.com.au  Fact Sheet for Healthcare Providers: IncredibleEmployment.be  This test is not yet approved or cleared by the [place] FDA and has been authorized for detection and/or diagnosis of SARS-CoV-2 by FDA under an Emergency Use Authorization (EUA). This EUA will remain in effect (meaning this test can be used) for the duration of the COVID-19 declaration under Section 564(b)(1) of the Act, 21 U.S.C. section 360bbb-3(b)(1), unless the authorization is terminated or revoked.  Performed at [facility], [place], [place] [address]   SARS CORONAVIRUS 2 (TAT 6-24 HRS) Nasopharyngeal Nasopharyngeal Swab     Status: None   Collection Time: 10/30/20  2:13 PM   Specimen: Nasopharyngeal Swab  Result Value Ref Range Status   SARS Coronavirus 2 NEGATIVE NEGATIVE Final    Comment: (NOTE) SARS-CoV-2 target nucleic acids are NOT DETECTED.  The SARS-CoV-2 RNA is generally detectable in upper and lower respiratory specimens during the acute phase of infection. Negative results do not preclude SARS-CoV-2 infection, do not rule out co-infections with other pathogens, and should not be used as the sole basis for treatment or other patient management decisions. Negative results must be combined with clinical observations, patient history, and epidemiological information. The expected result is Negative.  Fact Sheet for Patients: SugarRoll.be  Fact Sheet for Healthcare Providers: https://www.woods-mathews.com/  This test is not yet approved or cleared by  the [place] FDA and  has been authorized for detection and/or diagnosis of SARS-CoV-2 by FDA under an Emergency Use Authorization (EUA). This EUA will remain  in effect (meaning this test can be used) for the duration of the COVID-19 declaration under Se ction 564(b)(1) of the Act, 21 U.S.C. section 360bbb-3(b)(1), unless the authorization is terminated or revoked sooner.  Performed at [facility], [place] [address] [place], [place] [address]      Management plans discussed with the patient, family and they are in agreement.  CODE STATUS:     Code Status Orders  (From admission, onward)         Start     Ordered   10/30/20 2332  Full code  Continuous        10/30/20 2332        Code Status History    Date Active Date Inactive Code Status Order ID Comments User Context   10/30/2020 1044 10/30/2020 2332 DNR [id number]  [name] Edinger, MD ED   03/29/2020 1729 03/31/2020 1640 DNR [id number]  Ivor Costa, MD ED   03/29/2020 1628 03/29/2020 1729 Full Code [id number]  Ivor Costa, MD ED   06/26/2018 1345 06/28/2018 1727 DNR [id number]  Rolley Sims, RN Inpatient   06/26/2018 1149 06/26/2018 1345 Full Code [id number]  Fritzi Mandes, MD Inpatient   04/22/2018 0825 04/25/2018 1518 Full Code [id number]  Demetrios Loll, MD Inpatient   03/25/2018 0740 03/27/2018 1621 Full Code [id number]  Epifanio Lesches, MD ED   Advance Care Planning Activity    Advance Directive Documentation   Flowsheet Row Most Recent Value  Type of Advance Directive Living will,  Healthcare Power of Attorney  Pre-existing out of facility DNR order (yellow form or pink MOST form) -  "MOST" Form in Place? -      TOTAL TIME TAKING CARE OF THIS PATIENT: 35 minutes.    Loletha Grayer M.D on 11/02/2020 at 4:18 PM  Between 7am to 6pm - Pager - [phone]  After 6pm go to [email or web address] - password [email or web address]  [facility]  CC: Primary care physician; Steele Sizer, MD

## 2020-11-02 NOTE — TOC Transition Note (Signed)
Transition of Care [facility]) - CM/SW Discharge Note   Patient Details  Name: [name] MRN: [id number] Date of Birth: November 09, 1940  [facility]) CM/SW Contact:  Magnus Ivan, LCSW Phone Number: 11/02/2020, 11:12 AM   Clinical Narrative:   Patient to discharge home today. RW ordered yesterday through [place]. Family plans to purchase a cane for patient as well. No additional TOC needs identified prior to discharge. TOC signing off.    Final next level of care: Home/Self Care Barriers to Discharge: Barriers Resolved   Patient Goals and CMS Choice Patient states their goals for this hospitalization and ongoing recovery are:: home with self care CMS Medicare.gov Compare Post Acute Care list provided to:: Patient Choice offered to / list presented to : [place]  Discharge Placement                  Name of family member notified: patient aware.    Discharge Plan and Services                DME Arranged: Walker rolling DME Agency: [employer] Date DME Agency Contacted: 11/01/20   Representative spoke with at DME Agency: Nash Shearer            Social Determinants of Health (SDOH) Interventions     Readmission Risk Interventions No flowsheet data found.

## 2020-11-02 NOTE — Progress Notes (Signed)
Pt dc per MD order, dc instructions and follow up appt gone over with pt. All questions answered. IV dc, site looks good

## 2020-11-02 NOTE — Care Management Important Message (Signed)
Important Message  Patient Details  Name: KENZLIE DISCH MRN: [id number] Date of Birth: December 04, 1940   Medicare Important Message Given:  N/A - LOS <3 / Initial given by admissions     Juliann Pulse A Malkia Nippert 11/02/2020, 9:09 AM

## 2020-11-04 LAB — CULTURE, BLOOD (ROUTINE X 2)
Culture: NO GROWTH
Culture: NO GROWTH
Special Requests: ADEQUATE
Special Requests: ADEQUATE

## 2020-11-04 NOTE — Progress Notes (Signed)
Date:  11/05/2020   ID:  [name], DOB 1941-05-02, MRN [id number]  Patient Location:  [address] [place] [address]   Provider location:   [facility], [place] office  PCP:  Steele Sizer, MD  Cardiologist:  Ida Rogue, MD   Chief Complaint  Patient presents with  . Other    12 month follow up. Meds reviewed verbally with patient.     History of Present Illness:    [name] is a 80 y.o. 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 Lovena Le to have sinus tachycardia, lost to cardiology follow-up Carotid u/s  01/2018 with no significant blockages, bruit on the left Ejection fraction 45 to 50%, mild to moderately elevated right heart pressures on echo May 2019 Several cardioversions for atrial fibrillation June and July 2019 Who presents for follow-up of her paroxysmal  atrial fibrillation  Last seen in clinic by myself November 05, 2019 Seen in the hospital October 30, 2020 acute respiratory failure with hypoxia Pneumonia, multifocal bilateral Treated with Solu-Medrol, nebulizers, Levaquin Diovan was held at discharge for low pressure  Echocardiogram Ejection fraction 60% Moderate elevated right heart pressures estimated 54 mmHg Mild to moderate MR, 2 jets  Prior to PNA, BP was low  EKG personally reviewed by myself on todays visit Shows NSR rate 70 bpm, LBBB  Other past medical history reviewed Lumbar films showed multiple level spondylosis and spurs worse at L1-2, L2-3, and L3-4.   Seen by [facility]  s/p  left L4-5 microdiscectomy.  hospital September 2019 hypokalemia, cystitis with hematuria, acute kidney injury On arrival potassium was 2.6 creatinine 1.57   hospital July 2019 for acute on chronic combined CHF Persistent atrial fibrillation with RVR and respiratory distress with  hypoxia Treated with Lasix Status post briefly successful DCCV on 6/4. back in A. fib approximately 6/7 to 6/8,  DCCV 7/3, successful, bradycardia  Event monitor paroxysmal atrial fibrillation  Past Medical History:  Diagnosis Date  . Anxiety   . Bronchitis   . Chronic combined systolic (congestive) and diastolic (congestive) heart failure ([place])    a. 02/2018 Echo: EF 45-50%, diff HK, triv AI, mod MR, mildly dil LA/RA, nl RV fxn, mod TR. PASP 40-25mmHg.  Marland Kitchen Chronic kidney disease   . DJD (degenerative joint disease), cervical   . History of stress test    a. 01/2008 MV: EF 76%. Fair ex tol. No ischemia.  . Hyperlipidemia   . Hypertension   . Hypothyroid   . LBBB (left bundle branch block)   . Leg pain    a. 01/2018 ABI's wnl.  . Lumbar spondylosis   . Persistent atrial fibrillation ([place])    a. 01/2018 Event monitor: 45 runs of SVT, longest 14.5 sec. SVT felt to be Afib/flutter-->2% burden. Longest run of AF 4h 9m (CHA2DS2VASc = 5-->Xarelto); b. 03/2019 s/p DCCV; c. 04/2019 Repeat Amio load and DCCV.  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 BACK SURGERY  07/2019  . CARDIOVERSION N/A 03/26/2018   Procedure: CARDIOVERSION;  Surgeon: Nelva Bush, MD;  Location: [facility] ORS;  Service: Cardiovascular;  Laterality: N/A;  . CARDIOVERSION N/A 04/24/2018   Procedure: CARDIOVERSION;  Surgeon: Minna Merritts, MD;  Location: [facility] ORS;  Service: Cardiovascular;  Laterality: N/A;  . ELECTROPHYSIOLOGY STUDY N/A 02/27/2012   Procedure: ELECTROPHYSIOLOGY STUDY;  Surgeon: Evans Lance, MD;  Location: [facility] CATH LAB;  Service: Cardiovascular;  Laterality: N/A;  . EPS and ablation for SVT  5/13   slow pathway ablation by Dr Lovena Le  . EYE SURGERY     surgery for detatched retina  . NECK SURGERY  02/2020  . SUPRAVENTRICULAR TACHYCARDIA ABLATION N/A 02/27/2012   Procedure: SUPRAVENTRICULAR TACHYCARDIA  ABLATION;  Surgeon: Evans Lance, MD;  Location: [facility] CATH LAB;  Service: Cardiovascular;  Laterality: N/A;     Current Meds  Medication Sig  . acetaminophen (TYLENOL) 650 MG CR tablet Take 650-1,300 mg by mouth every 8 (eight) hours as needed for pain.   Marland Kitchen albuterol (VENTOLIN HFA) 108 (90 Base) MCG/ACT inhaler Inhale 2 puffs into the lungs every 6 (six) hours as needed for wheezing or shortness of breath.  Marland Kitchen amiodarone (PACERONE) 200 MG tablet TAKE 1 TABLET(200 MG) BY MOUTH DAILY  . busPIRone (BUSPAR) 5 MG tablet Take 1 tablet (5 mg total) by mouth 2 (two) times daily.  . cholecalciferol (VITAMIN D) 1000 UNITS tablet Take 2,000 Units by mouth daily.  . Cranberry 500 MG TABS Take 2 tablets by mouth daily.  Marland Kitchen doxazosin (CARDURA) 1 MG tablet TAKE 1 TABLET(1 MG) BY MOUTH TWICE DAILY  . ELIQUIS 5 MG TABS tablet TAKE 1 TABLET(5 MG) BY MOUTH TWICE DAILY  . furosemide (LASIX) 20 MG tablet Take 20 mg by mouth See admin instructions. Take 1 tablet (20mg ) by mouth every morning - take 1 additional tablet (20mg ) after lunch if needed for swelling  . levofloxacin (LEVAQUIN) 750 MG tablet Take 1 tablet (750 mg total) by mouth every other day for 2 days.  Marland Kitchen levothyroxine (SYNTHROID) 137 MCG tablet TAKE 1 TABLET(137 MCG) BY MOUTH EVERY MORNING  . metoprolol succinate (TOPROL-XL) 50 MG 24 hr tablet Take with or immediately following a meal.  . Multiple Vitamin (MULITIVITAMIN WITH MINERALS) TABS Take 1 tablet by mouth daily.  . potassium chloride (KLOR-CON) 10 MEQ tablet TAKE 1 TABLET BY MOUTH EVERY DAY AS DIRECTED. MAY TAKE AN EXTRA PILL AFTER LUNCH WITH FLUID PILL AS NEEDED FOR SWELLING  . rosuvastatin (CRESTOR) 40 MG tablet TAKE 1 TABLET(40 MG) BY MOUTH DAILY  . temazepam (RESTORIL) 15 MG capsule TAKE 1 CAPSULE(15 MG) BY MOUTH AT BEDTIME AS NEEDED FOR SLEEP     Allergies:   Chocolate, Penicillins, Codeine, Erythromycin, and Septra [sulfamethoxazole-trimethoprim]   Social History   Tobacco Use  .  Smoking status: Never Smoker  . Smokeless tobacco: Never Used  . Tobacco comment: smoking cessation materials not required  Vaping Use  . Vaping Use: Never used  Substance Use Topics  . Alcohol use: No  . Drug use: No      Family Hx: The patient's family history includes Alzheimer's disease in her maternal grandfather and mother; Breast cancer in her father and paternal aunt; Heart disease in her father; Kidney cancer in her maternal grandmother; Stroke in her father and paternal grandfather; Thyroid disease in her paternal grandmother.  ROS:   Please see the history of present illness.    Review of Systems  Constitutional: Negative.        Weak  HENT: Negative.   Respiratory: Negative.   Cardiovascular: Negative.   Gastrointestinal: Negative.   Musculoskeletal: Negative.        Gait instability  Neurological: Negative.   Psychiatric/Behavioral: Negative.   All other systems reviewed and are  negative.    Labs/Other Tests and Data Reviewed:    Recent Labs: 10/30/2020: ALT 42; B Natriuretic Peptide 179.6; Magnesium 2.0; TSH 0.852 10/31/2020: Hemoglobin 11.4; Platelets 318 11/01/2020: BUN 22; Creatinine, Ser 1.05; Potassium 3.9; Sodium 138   Recent Lipid Panel Lab Results  Component Value Date/Time   CHOL 134 09/13/2020 12:00 AM   CHOL 164 06/06/2016 09:07 AM   TRIG 54 10/30/2020 04:03 AM   HDL 56 09/13/2020 12:00 AM   HDL 54 06/06/2016 09:07 AM   CHOLHDL 2.4 09/13/2020 12:00 AM   LDLCALC 53 09/13/2020 12:00 AM    Wt Readings from Last 3 Encounters:  11/05/20 130 lb (59 kg)  11/02/20 130 lb 8.2 oz (59.2 kg)  09/13/20 133 lb 8 oz (60.6 kg)     Exam:    Vital Signs:  BP 132/60 (BP Location: Right Arm, Patient Position: Sitting, Cuff Size: Normal)   Pulse 70   Ht 5\' 6"  (1.676 m)   Wt 130 lb (59 kg)   SpO2 97%   BMI 20.98 kg/m    Constitutional:  oriented to person, place, and time. No distress.  Frail, presenting in a wheelchair HENT:  Head: Grossly  normal Eyes:  no discharge. No scleral icterus.  Neck: No JVD, 2+ carotid bruit on the left Cardiovascular: Regular rate and rhythm, no murmurs appreciated Pulmonary/Chest: Clear to auscultation bilaterally, no wheezes or rails Abdominal: Soft.  no distension.  no tenderness.  Musculoskeletal: Normal range of motion Neurological:  normal muscle tone. Coordination normal. No atrophy Skin: Skin warm and dry Psychiatric: normal affect, pleasant   ASSESSMENT & PLAN:     Chronic systolic heart failure (HCC) Appears euvolemic but does have moderately elevated right heart pressures on echo several days ago Reports he did have swelling shortness of breath but this has resolved after staying in the hospital Given weak state, will recommend continue Lasix with potassium in the morning and extra Lasix in the afternoon as needed with potassium for weight gain shortness of breath bloating  Paroxysmal atrial fibrillation (HCC) On metoprolol Eliquis amiodarone  Essential hypertension Changes made, valsartan 160 in the morning added back, hold amlodipine, Cardura 1 mg in the p.m., continue metoprolol in the a.m. She will call us with blood pressure measurements  PAD (peripheral artery disease) (HCC) Carotid bruit on left, U/s 01/2018 no stenosis  SVT (supraventricular tachycardia) (HCC) Denies any tachycardia rotations, continue metoprolol  Mixed hyperlipidemia Lipids at goal, no changes made  pulmonary hypertension, unspecified ([employer]) treated with Lasix Denies shortness of breath, recommended extra Lasix in the afternoon as needed She is concerned about renal function and wants to avoid dehydration  CKD (chronic kidney disease), stage III ([place]) Stable renal function  Extensive hospital records reviewed personally by myself and discussed with her on her visit today Family presenting with her today Discussed management of pulmonary hypertension, hypertension Numerous medication changes  made  Total encounter time more than 45 minutes  Greater than 50% was spent in counseling and coordination of care with the patient     Signed, Ida Rogue, MD  11/05/2020 10:20 AM    [facility] [address], [place], [place] [address]

## 2020-11-05 ENCOUNTER — Ambulatory Visit: Payer: Medicare Other | Admitting: Cardiovascular Disease

## 2020-11-05 ENCOUNTER — Other Ambulatory Visit: Payer: Self-pay

## 2020-11-05 ENCOUNTER — Encounter: Payer: Self-pay | Admitting: Cardiovascular Disease

## 2020-11-05 VITALS — BP 132/60 | HR 70 | Ht 66.0 in | Wt 130.0 lb

## 2020-11-05 DIAGNOSIS — I482 Chronic atrial fibrillation, unspecified: Secondary | ICD-10-CM | POA: Diagnosis not present

## 2020-11-05 DIAGNOSIS — E782 Mixed hyperlipidemia: Secondary | ICD-10-CM

## 2020-11-05 DIAGNOSIS — I739 Peripheral vascular disease, unspecified: Secondary | ICD-10-CM | POA: Diagnosis not present

## 2020-11-05 DIAGNOSIS — E785 Hyperlipidemia, unspecified: Secondary | ICD-10-CM

## 2020-11-05 DIAGNOSIS — I5042 Chronic combined systolic (congestive) and diastolic (congestive) heart failure: Secondary | ICD-10-CM

## 2020-11-05 DIAGNOSIS — I10 Essential (primary) hypertension: Secondary | ICD-10-CM | POA: Diagnosis not present

## 2020-11-05 DIAGNOSIS — I4819 Other persistent atrial fibrillation: Secondary | ICD-10-CM | POA: Diagnosis not present

## 2020-11-05 DIAGNOSIS — I5022 Chronic systolic (congestive) heart failure: Secondary | ICD-10-CM | POA: Diagnosis not present

## 2020-11-05 DIAGNOSIS — I471 Supraventricular tachycardia: Secondary | ICD-10-CM | POA: Diagnosis not present

## 2020-11-05 MED ORDER — FUROSEMIDE 20 MG PO TABS: 20.0000 mg | ORAL_TABLET | ORAL | 3 refills | Status: DC

## 2020-11-05 MED ORDER — POTASSIUM CHLORIDE ER 10 MEQ PO TBCR: EXTENDED_RELEASE_TABLET | ORAL | 3 refills | Status: DC

## 2020-11-05 MED ORDER — VALSARTAN 160 MG PO TABS: 160.0000 mg | ORAL_TABLET | Freq: Every day | ORAL | 3 refills | Status: DC

## 2020-11-05 MED ORDER — AMIODARONE HCL 200 MG PO TABS: ORAL_TABLET | ORAL | 3 refills | Status: DC

## 2020-11-05 MED ORDER — METOPROLOL SUCCINATE ER 50 MG PO TB24: ORAL_TABLET | ORAL | 3 refills | Status: DC

## 2020-11-05 MED ORDER — DOXAZOSIN MESYLATE 1 MG PO TABS: 1.0000 mg | ORAL_TABLET | Freq: Every evening | ORAL | 3 refills | Status: DC

## 2020-11-05 MED ORDER — ROSUVASTATIN CALCIUM 40 MG PO TABS: ORAL_TABLET | ORAL | 3 refills | Status: DC

## 2020-11-05 NOTE — Patient Instructions (Addendum)
Medication Instructions:   Stop: Do not take amlodipine (no longer on your updated medication list)  Continue taking:  metoprolol 50 mg daily in the (am)  Lasix 20 mg (Take 1 tablet (20mg ) by mouth every morning - take 1 additional tablet (20mg ) after lunch if needed for swelling)  Potassium 10 MEQ  Restart: the valsartan 160 mg by mouth once in the morning (If you have any of your current 320 mg pills Dr. Rockey Situ stated you may break the pill in half for the 160 mg dosing)  Decrease: the cardura 1 mg to once a day in the evening  Lasix daily, potassium   Lab work: None  Testing/Procedures: No new testing needed   Follow-Up: At [facility], you and your health needs are our priority.  As part of our continuing mission to provide you with exceptional heart care, we have created designated Provider Care Teams.  These Care Teams include your primary Cardiologist (physician) and Advanced Practice Providers (APPs -  Physician Assistants and Nurse Practitioners) who all work together to provide you with the care you need, when you need it.  . You will need a follow up appointment in 12 months  . Providers on your designated Care Team:   . Murray Hodgkins, NP . Christell Faith, PA-C . Marrianne Mood, PA-C  Any Other Special Instructions Will Be Listed Below (If Applicable).  COVID-19 Vaccine Information can be found at: [email or web address].uk For questions related to vaccine distribution or appointments, please email vaccine@[email or web address] .[email or web address] or call [phone].

## 2020-11-08 ENCOUNTER — Telehealth: Payer: Self-pay

## 2020-11-08 NOTE — Chronic Care Management (AMB) (Signed)
Chronic Care Management Pharmacy Assistant   Name: [name]  MRN: [id number] DOB: 1940/12/29  Reason for Encounter: Disease State - Hypertension Adherence  Patient Questions:  1.  Have you seen any other providers since your last visit? Yes 08/28/2020 [name] , MD ED - acute urinary tract infection 09/13/2020 - Leslie Bells Sowles,MD - CHF 10/31/2019 [facility] Admission    2.  Any changes in your medicines or health? Yes,  Amiodarone -200 mg one tablet a day Apixaban 5 mg one tablet a day Buspirone 5 mg bid/ Cholecalciferol 2,000 mg daily      PCP : Steele Sizer, MD  Allergies:   Allergies  Allergen Reactions   Chocolate Swelling   Penicillins Swelling and Rash    Has patient had a PCN reaction causing immediate rash, facial/tongue/throat swelling, SOB or lightheadedness with hypotension: Yes Has patient had a PCN reaction causing severe rash involving mucus membranes or skin necrosis: No Has patient had a PCN reaction that required hospitalization: No Has patient had a PCN reaction occurring within the last 10 years: No If all of the above answers are "NO", then may proceed with Cephalosporin use.    Codeine Nausea And Vomiting   Erythromycin Itching and Swelling   Septra [Sulfamethoxazole-Trimethoprim] Swelling    Medications: Outpatient Encounter Medications as of 11/08/2020  Medication Sig   acetaminophen (TYLENOL) 650 MG CR tablet Take 650-1,300 mg by mouth every 8 (eight) hours as needed for pain.    albuterol (VENTOLIN HFA) 108 (90 Base) MCG/ACT inhaler Inhale 2 puffs into the lungs every 6 (six) hours as needed for wheezing or shortness of breath.   amiodarone (PACERONE) 200 MG tablet TAKE 1 TABLET(200 MG) BY MOUTH DAILY   busPIRone (BUSPAR) 5 MG tablet Take 1 tablet (5 mg total) by mouth 2 (two) times daily.   cholecalciferol (VITAMIN D) 1000 UNITS tablet Take 2,000 Units by mouth daily.   Cranberry 500 MG TABS Take 2 tablets  by mouth daily.   doxazosin (CARDURA) 1 MG tablet Take 1 tablet (1 mg total) by mouth every evening.   ELIQUIS 5 MG TABS tablet TAKE 1 TABLET(5 MG) BY MOUTH TWICE DAILY   furosemide (LASIX) 20 MG tablet Take 1 tablet (20 mg total) by mouth See admin instructions. Take 1 tablet (20mg ) by mouth every morning - take 1 additional tablet (20mg ) after lunch if needed for swelling   levothyroxine (SYNTHROID) 137 MCG tablet TAKE 1 TABLET(137 MCG) BY MOUTH EVERY MORNING   metoprolol succinate (TOPROL-XL) 50 MG 24 hr tablet Take with or immediately following a meal.   Multiple Vitamin (MULITIVITAMIN WITH MINERALS) TABS Take 1 tablet by mouth daily.   potassium chloride (KLOR-CON) 10 MEQ tablet TAKE 1 TABLET BY MOUTH EVERY DAY AS DIRECTED. MAY TAKE AN EXTRA PILL AFTER LUNCH WITH FLUID PILL AS NEEDED FOR SWELLING   rosuvastatin (CRESTOR) 40 MG tablet TAKE 1 TABLET(40 MG) BY MOUTH DAILY   temazepam (RESTORIL) 15 MG capsule TAKE 1 CAPSULE(15 MG) BY MOUTH AT BEDTIME AS NEEDED FOR SLEEP   valsartan (DIOVAN) 160 MG tablet Take 1 tablet (160 mg total) by mouth daily. Take in the morning   No facility-administered encounter medications on file as of 11/08/2020.    Current Diagnosis: Patient Active Problem List   Diagnosis Date Noted   Severe sepsis ([place])    Acquired thrombophilia ([place])    Community acquired pneumonia 10/30/2020   Acute respiratory failure ([place]) 10/30/2020  Multifocal pneumonia 10/30/2020   Pyelonephritis 03/30/2020   Acute pyelonephritis 03/29/2020   Hydronephrosis of right kidney 03/29/2020   Depression 03/29/2020   Chronic diastolic CHF (congestive heart failure) ([place]) 05/24/2018   AF (paroxysmal atrial fibrillation) ([place]) 05/24/2018   CKD (chronic kidney disease), stage III ([place]) 05/17/2018   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   Shortness of breath 05/07/2018   Pulmonary hypertension, unspecified ([place]) 04/05/2018   Acute on chronic combined systolic and  diastolic CHF (congestive heart failure) ([place]) 04/05/2018   Paroxysmal sinus tachycardia ([place]) 01/29/2018   Leg pain 01/04/2018   PAD (peripheral artery disease) ([place]) 01/04/2018   Carotid artery calcification, bilateral 12/19/2017   Cervical radiculitis 12/19/2017   Hyperglycemia 03/06/2016   Hypothyroid 04/21/2015   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   Anxiety 04/21/2015   Hyperlipidemia 04/21/2015   Diuretic-induced hypokalemia 04/21/2015   Palpitations 12/18/2012   Hypertension 05/27/2012      PCP : Steele Sizer, MD  Allergies:   Allergies  Allergen Reactions   Chocolate Swelling   Penicillins Swelling and Rash    Has patient had a PCN reaction causing immediate rash, facial/tongue/throat swelling, SOB or lightheadedness with hypotension: Yes Has patient had a PCN reaction causing severe rash involving mucus membranes or skin necrosis: No Has patient had a PCN reaction that required hospitalization: No Has patient had a PCN reaction occurring within the last 10 years: No If all of the above answers are "NO", then may proceed with Cephalosporin use.    Codeine Nausea And Vomiting   Erythromycin Itching and Swelling   Septra [Sulfamethoxazole-Trimethoprim] Swelling    Medications: Outpatient Encounter Medications as of 11/08/2020  Medication Sig   acetaminophen (TYLENOL) 650 MG CR tablet Take 650-1,300 mg by mouth every 8 (eight) hours as needed for pain.    albuterol (VENTOLIN HFA) 108 (90 Base) MCG/ACT inhaler Inhale 2 puffs into the lungs every 6 (six) hours as needed for wheezing or shortness of breath.   amiodarone (PACERONE) 200 MG tablet TAKE 1 TABLET(200 MG) BY MOUTH DAILY   busPIRone (BUSPAR) 5 MG tablet Take 1 tablet (5 mg total) by mouth 2 (two) times daily.   cholecalciferol (VITAMIN D) 1000 UNITS tablet Take 2,000 Units by mouth daily.   Cranberry 500 MG TABS Take 2 tablets by mouth daily.   doxazosin  (CARDURA) 1 MG tablet Take 1 tablet (1 mg total) by mouth every evening.   ELIQUIS 5 MG TABS tablet TAKE 1 TABLET(5 MG) BY MOUTH TWICE DAILY   furosemide (LASIX) 20 MG tablet Take 1 tablet (20 mg total) by mouth See admin instructions. Take 1 tablet (20mg ) by mouth every morning - take 1 additional tablet (20mg ) after lunch if needed for swelling   levothyroxine (SYNTHROID) 137 MCG tablet TAKE 1 TABLET(137 MCG) BY MOUTH EVERY MORNING   metoprolol succinate (TOPROL-XL) 50 MG 24 hr tablet Take with or immediately following a meal.   Multiple Vitamin (MULITIVITAMIN WITH MINERALS) TABS Take 1 tablet by mouth daily.   potassium chloride (KLOR-CON) 10 MEQ tablet TAKE 1 TABLET BY MOUTH EVERY DAY AS DIRECTED. MAY TAKE AN EXTRA PILL AFTER LUNCH WITH FLUID PILL AS NEEDED FOR SWELLING   rosuvastatin (CRESTOR) 40 MG tablet TAKE 1 TABLET(40 MG) BY MOUTH DAILY   temazepam (RESTORIL) 15 MG capsule TAKE 1 CAPSULE(15 MG) BY MOUTH AT BEDTIME AS NEEDED FOR SLEEP   valsartan (DIOVAN) 160 MG tablet Take 1 tablet (160 mg total) by mouth daily. Take in the morning  No facility-administered encounter medications on file as of 11/08/2020.    Current Diagnosis: Patient Active Problem List   Diagnosis Date Noted   Severe sepsis ([place])    Acquired thrombophilia ([place])    Community acquired pneumonia 10/30/2020   Acute respiratory failure ([place]) 10/30/2020   Multifocal pneumonia 10/30/2020   Pyelonephritis 03/30/2020   Acute pyelonephritis 03/29/2020   Hydronephrosis of right kidney 03/29/2020   Depression 03/29/2020   Chronic diastolic CHF (congestive heart failure) ([place]) 05/24/2018   AF (paroxysmal atrial fibrillation) ([place]) 05/24/2018   CKD (chronic kidney disease), stage III ([place]) 05/17/2018   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   Shortness of breath 05/07/2018   Pulmonary hypertension, unspecified ([place]) 04/05/2018   Acute on chronic combined systolic and diastolic CHF (congestive  heart failure) ([place]) 04/05/2018   Paroxysmal sinus tachycardia ([place]) 01/29/2018   Leg pain 01/04/2018   PAD (peripheral artery disease) ([place]) 01/04/2018   Carotid artery calcification, bilateral 12/19/2017   Cervical radiculitis 12/19/2017   Hyperglycemia 03/06/2016   Hypothyroid 04/21/2015   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   Anxiety 04/21/2015   Hyperlipidemia 04/21/2015   Diuretic-induced hypokalemia 04/21/2015   Palpitations 12/18/2012   Hypertension 05/27/2012   Reviewed chart prior to disease state call. Spoke with patient regarding BP  Recent Office Vitals: BP Readings from Last 3 Encounters:  11/05/20 132/60  11/02/20 (!) 135/54  09/13/20 130/68   Pulse Readings from Last 3 Encounters:  11/05/20 70  11/02/20 64  09/13/20 76    Wt Readings from Last 3 Encounters:  11/05/20 130 lb (59 kg)  11/02/20 130 lb 8.2 oz (59.2 kg)  09/13/20 133 lb 8 oz (60.6 kg)     Kidney Function Lab Results  Component Value Date/Time   CREATININE 1.05 (H) 11/01/2020 02:52 AM   CREATININE 0.88 10/31/2020 04:56 AM   CREATININE 1.14 (H) 09/13/2020 12:00 AM   CREATININE 1.25 (H) 02/02/2020 02:30 PM   GFRNONAA 54 (L) 11/01/2020 02:52 AM   GFRNONAA 46 (L) 09/13/2020 12:00 AM   GFRAA 53 (L) 09/13/2020 12:00 AM    BMP Latest Ref Rng & Units 11/01/2020 10/31/2020 10/30/2020  Glucose 70 - 99 mg/dL 168(H) 114(H) 154(H)  BUN 8 - 23 mg/dL 22 16 16   Creatinine 0.44 - 1.00 mg/dL 1.05(H) 0.88 0.93  BUN/Creat Ratio 6 - 22 (calc) - - -  Sodium 135 - 145 mmol/L 138 137 136  Potassium 3.5 - 5.1 mmol/L 3.9 4.0 3.0(L)  Chloride 98 - 111 mmol/L 105 104 103  CO2 22 - 32 mmol/L 23 23 22   Calcium 8.9 - 10.3 mg/dL 9.1 9.0 8.4(L)    Current antihypertensive regimen:  o Valsartan 160 mg one a day  o Cardura 1 mg one tablet  o Furosemide 20 mg one a day  o Metoprolol Succinate 50 mg o   How often are you checking your Blood Pressure? Patient states since she got  out of hospital she is taking blood pressure three times a day.   Current home BP readings: 120/50    What recent interventions/DTPs have been made by any provider to improve Blood Pressure control since last CPP Visit: Patient states she has been taking her medication as directed.   Any recent hospitalizations or ED visits since last visit with CPP? Yes   What diet changes have been made to improve Blood Pressure Control?  o Patient has been watching her salt intake. o   What exercise is being done to improve your Blood  Pressure Control?  o Patient states she has not been exercising, she is still weak from being in hospital . o   Adherence Review: Is the patient currently on ACE/ARB medication? Yes  Does the patient have >5 day gap between last estimated fill dates?      Follow-Up:  Pharmacist Review - Patient states that her Diovan was decrease at hospital visit / she states she is breathing better, no swelling of feet and legs , States her blood pressure reading have been good.   Daron Offer CPP Notified  Judithann Sheen, [facility] Clinical Pharmacist Assistant [phone]

## 2020-11-18 ENCOUNTER — Ambulatory Visit
Admission: RE | Admit: 2020-11-18 | Discharge: 2020-11-18 | Disposition: A | Discharge status: Home / Self Care | Payer: Medicare Other | Source: Ambulatory Visit | Attending: Family Medicine | Admitting: Family Medicine

## 2020-11-18 ENCOUNTER — Ambulatory Visit (INDEPENDENT_AMBULATORY_CARE_PROVIDER_SITE_OTHER): Payer: Medicare Other | Admitting: Family Medicine

## 2020-11-18 ENCOUNTER — Other Ambulatory Visit
Admission: RE | Admit: 2020-11-18 | Discharge: 2020-11-18 | Disposition: A | Discharge status: Home / Self Care | Payer: Medicare Other | Source: Ambulatory Visit | Attending: Family Medicine | Admitting: Family Medicine

## 2020-11-18 ENCOUNTER — Encounter: Payer: Self-pay | Admitting: Family Medicine

## 2020-11-18 ENCOUNTER — Other Ambulatory Visit: Payer: Self-pay

## 2020-11-18 ENCOUNTER — Other Ambulatory Visit: Payer: Self-pay | Admitting: Family Medicine

## 2020-11-18 ENCOUNTER — Other Ambulatory Visit: Payer: Medicare Other

## 2020-11-18 VITALS — BP 139/57 | HR 67 | Temp 97.8°F

## 2020-11-18 DIAGNOSIS — R059 Cough, unspecified: Secondary | ICD-10-CM

## 2020-11-18 DIAGNOSIS — R0602 Shortness of breath: Secondary | ICD-10-CM

## 2020-11-18 DIAGNOSIS — J9811 Atelectasis: Secondary | ICD-10-CM | POA: Diagnosis not present

## 2020-11-18 DIAGNOSIS — R7989 Other specified abnormal findings of blood chemistry: Secondary | ICD-10-CM

## 2020-11-18 DIAGNOSIS — Z8701 Personal history of pneumonia (recurrent): Secondary | ICD-10-CM

## 2020-11-18 DIAGNOSIS — J811 Chronic pulmonary edema: Secondary | ICD-10-CM | POA: Diagnosis not present

## 2020-11-18 DIAGNOSIS — U071 COVID-19: Secondary | ICD-10-CM | POA: Diagnosis not present

## 2020-11-18 DIAGNOSIS — I517 Cardiomegaly: Secondary | ICD-10-CM | POA: Diagnosis not present

## 2020-11-18 DIAGNOSIS — J90 Pleural effusion, not elsewhere classified: Secondary | ICD-10-CM | POA: Diagnosis not present

## 2020-11-18 DIAGNOSIS — K449 Diaphragmatic hernia without obstruction or gangrene: Secondary | ICD-10-CM | POA: Diagnosis not present

## 2020-11-18 DIAGNOSIS — Z20822 Contact with and (suspected) exposure to covid-19: Secondary | ICD-10-CM | POA: Diagnosis not present

## 2020-11-18 LAB — COMPREHENSIVE METABOLIC PANEL
ALT: 18 U/L (ref 0–44)
AST: 25 U/L (ref 15–41)
Albumin: 3.3 g/dL — ABNORMAL LOW (ref 3.5–5.0)
Alkaline Phosphatase: 80 U/L (ref 38–126)
Anion gap: 12 (ref 5–15)
BUN: 18 mg/dL (ref 8–23)
CO2: 23 mmol/L (ref 22–32)
Calcium: 8.8 mg/dL — ABNORMAL LOW (ref 8.9–10.3)
Chloride: 103 mmol/L (ref 98–111)
Creatinine, Ser: 1.06 mg/dL — ABNORMAL HIGH (ref 0.44–1.00)
GFR, Estimated: 54 mL/min — ABNORMAL LOW (ref >60)
Glucose, Bld: 177 mg/dL — ABNORMAL HIGH (ref 70–99)
Potassium: 3.5 mmol/L (ref 3.5–5.1)
Sodium: 138 mmol/L (ref 135–145)
Total Bilirubin: 0.6 mg/dL (ref 0.3–1.2)
Total Protein: 7.4 g/dL (ref 6.5–8.1)

## 2020-11-18 LAB — CBC WITH DIFFERENTIAL/PLATELET
Abs Immature Granulocytes: 0.07 10*3/uL (ref 0.00–0.07)
Basophils Absolute: 0 10*3/uL (ref 0.0–0.1)
Basophils Relative: 0 %
Eosinophils Absolute: 0.1 10*3/uL (ref 0.0–0.5)
Eosinophils Relative: 1 %
HCT: 32.1 % — ABNORMAL LOW (ref 36.0–46.0)
Hemoglobin: 10.5 g/dL — ABNORMAL LOW (ref 12.0–15.0)
Immature Granulocytes: 1 %
Lymphocytes Relative: 9 %
Lymphs Abs: 0.9 10*3/uL (ref 0.7–4.0)
MCH: 30.7 pg (ref 26.0–34.0)
MCHC: 32.7 g/dL (ref 30.0–36.0)
MCV: 93.9 fL (ref 80.0–100.0)
Monocytes Absolute: 0.8 10*3/uL (ref 0.1–1.0)
Monocytes Relative: 8 %
Neutro Abs: 8.7 10*3/uL — ABNORMAL HIGH (ref 1.7–7.7)
Neutrophils Relative %: 81 %
Platelets: 255 10*3/uL (ref 150–400)
RBC: 3.42 MIL/uL — ABNORMAL LOW (ref 3.87–5.11)
RDW: 14.7 % (ref 11.5–15.5)
WBC: 10.5 10*3/uL (ref 4.0–10.5)
nRBC: 0 % (ref 0.0–0.2)

## 2020-11-18 LAB — FIBRIN DERIVATIVES D-DIMER (ARMC ONLY): Fibrin derivatives D-dimer (ARMC): 1080.58 ng/mL (FEU) — ABNORMAL HIGH (ref 0.00–499.00)

## 2020-11-18 LAB — BRAIN NATRIURETIC PEPTIDE: B Natriuretic Peptide: 248.6 pg/mL — ABNORMAL HIGH (ref 0.0–100.0)

## 2020-11-18 LAB — C-REACTIVE PROTEIN: CRP: 16.4 mg/dL — ABNORMAL HIGH (ref <1.0)

## 2020-11-18 MED ORDER — IOHEXOL 300 MG/ML  SOLN
75.0000 mL | Freq: Once | INTRAMUSCULAR | Status: AC | PRN
  Administered 2020-11-18: 100 mL via INTRAVENOUS

## 2020-11-18 NOTE — Progress Notes (Signed)
Name: [name]   MRN: [id number]    DOB: 11/08/1940   Date:11/18/2020       Progress Note  Subjective  Chief Complaint  Cough/Congestion  I connected with  [name] on 11/18/20 at  8:20 AM EST by telephone and verified that I am speaking with the correct person using two identifiers.  I discussed the limitations, risks, security and privacy concerns of performing an evaluation and management service by telephone and the availability of in person appointments. Staff also discussed with the patient that there may be a patient responsible charge related to this service. Patient agreed on having a virtual visit  Patient Location: at home  Provider Location: [facility] Additional Individuals present: boyfriend   HPI  Cough: patient was admitted to [facility] on 11/02/2020 for increase in SOB, she was diagnosed with CAP, acute respiratory failure with hypoxia, sepsis During her hospital stay bp dropped and she was sent home with instructions to hold diovan and norvasc. Since discharge she was seen by Dr. Rockey Situ and was advised to stay off Norvasc, and taking half of valsartan , down to Cardura 1 mg at night, still on Metoprolol. She took the medications as recommended after discharge - had two days of steroids and 2 days of Levaquin, and was feeling better, however two days ago she noticed increase in chest congestions, she states cough is dry, also mild increase in SOB. She denies lower extremity edema. Denies orthopnea. She has been afebrile. Appetite has been normal .   Patient Active Problem List   Diagnosis Date Noted  . Severe sepsis ([place])   . Acquired thrombophilia ([place])   . Community acquired pneumonia 10/30/2020  . Acute respiratory failure ([place]) 10/30/2020  . Multifocal pneumonia 10/30/2020  . Pyelonephritis 03/30/2020  . Acute pyelonephritis 03/29/2020  . Hydronephrosis of right kidney 03/29/2020  . Depression 03/29/2020  . Chronic diastolic CHF (congestive heart failure) ([place])  05/24/2018  . AF (paroxysmal atrial fibrillation) ([place]) 05/24/2018  . CKD (chronic kidney disease), stage III ([place]) 05/17/2018  . 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 Shortness of breath 05/07/2018  . Pulmonary hypertension, unspecified ([place]) 04/05/2018  . Acute on chronic combined systolic and diastolic CHF (congestive heart failure) ([place]) 04/05/2018  . Paroxysmal sinus tachycardia ([place]) 01/29/2018  . Leg pain 01/04/2018  . PAD (peripheral artery disease) ([place]) 01/04/2018  . Carotid artery calcification, bilateral 12/19/2017  . Cervical radiculitis 12/19/2017  . Hyperglycemia 03/06/2016  . Hypothyroid 04/21/2015  . 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 Anxiety 04/21/2015  . Hyperlipidemia 04/21/2015  . Diuretic-induced hypokalemia 04/21/2015  . Palpitations 12/18/2012  . Hypertension 05/27/2012    Past Surgical History:  Procedure Laterality Date  . BACK SURGERY  07/2019  . CARDIOVERSION N/A 03/26/2018   Procedure: CARDIOVERSION;  Surgeon: Nelva Bush, MD;  Location: [facility] ORS;  Service: Cardiovascular;  Laterality: N/A;  . CARDIOVERSION N/A 04/24/2018   Procedure: CARDIOVERSION;  Surgeon: Minna Merritts, MD;  Location: [facility] ORS;  Service: Cardiovascular;  Laterality: N/A;  . ELECTROPHYSIOLOGY STUDY N/A 02/27/2012   Procedure: ELECTROPHYSIOLOGY STUDY;  Surgeon: Evans Lance, MD;  Location: [facility] CATH LAB;  Service: Cardiovascular;  Laterality: N/A;  . EPS and ablation for SVT  5/13   slow pathway ablation by Dr Lovena Le  . EYE SURGERY     surgery for detatched retina  . NECK SURGERY  02/2020  . SUPRAVENTRICULAR TACHYCARDIA ABLATION N/A 02/27/2012   Procedure: SUPRAVENTRICULAR TACHYCARDIA ABLATION;  Surgeon: Carleene Overlie  Peyton Najjar, MD;  Location: [facility] CATH LAB;  Service: Cardiovascular;  Laterality: N/A;    Family History  Problem Relation Age of Onset  . Alzheimer's disease Mother   . Stroke Father   . Breast cancer Father   . Heart disease Father         Pacemaker  . Breast cancer Paternal Aunt   . Kidney cancer Maternal Grandmother   . Alzheimer's disease Maternal Grandfather   . Thyroid disease Paternal Grandmother   . Stroke Paternal Grandfather     Social History   Socioeconomic History  . Marital status: Widowed    Spouse name: Not on file  . Number of children: 1  . Years of education: Not on file  . Highest education level: 12th grade  Occupational History  . Occupation: Retired  Tobacco Use  . Smoking status: Never Smoker  . Smokeless tobacco: Never Used  . Tobacco comment: smoking cessation materials not required  Vaping Use  . Vaping Use: Never used  Substance and Sexual Activity  . Alcohol use: No  . Drug use: No  . Sexual activity: Not Currently    Partners: Male    Birth control/protection: Post-menopausal  Other Topics Concern  . Not on file  Social History Narrative   Lives in [place], she has a boyfriend   She has cats, some inside, some outside    Social Determinants of Health   Financial Resource Strain: Low Risk   . Difficulty of Paying Living Expenses: Not hard at all  Food Insecurity: No Food Insecurity  . Worried About Charity fundraiser in the Last Year: Never true  . Ran Out of Food in the Last Year: Never true  Transportation Needs: No Transportation Needs  . Lack of Transportation (Medical): No  . Lack of Transportation (Non-Medical): No  Physical Activity: Sufficiently Active  . Days of Exercise per Week: 6 days  . Minutes of Exercise per Session: 30 min  Stress: No Stress Concern Present  . Feeling of Stress : Not at all  Social Connections: Moderately Integrated  . Frequency of Communication with Friends and Family: More than three times a week  . Frequency of Social Gatherings with Friends and Family: More than three times a week  . Attends Religious Services: More than 4 times per year  . Active Member of Clubs or Organizations: Yes  . Attends Archivist Meetings:  More than 4 times per year  . Marital Status: Widowed  Intimate Partner Violence: Not At Risk  . Fear of Current or Ex-Partner: No  . Emotionally Abused: No  . Physically Abused: No  . Sexually Abused: No     Current Outpatient Medications:  .  acetaminophen (TYLENOL) 650 MG CR tablet, Take 650-1,300 mg by mouth every 8 (eight) hours as needed for pain. , Disp: , Rfl:  .  albuterol (VENTOLIN HFA) 108 (90 Base) MCG/ACT inhaler, Inhale 2 puffs into the lungs every 6 (six) hours as needed for wheezing or shortness of breath., Disp: 8 g, Rfl: 0 .  amiodarone (PACERONE) 200 MG tablet, TAKE 1 TABLET(200 MG) BY MOUTH DAILY, Disp: 90 tablet, Rfl: 3 .  busPIRone (BUSPAR) 5 MG tablet, Take 1 tablet (5 mg total) by mouth 2 (two) times daily., Disp: 180 tablet, Rfl: 1 .  cholecalciferol (VITAMIN D) 1000 UNITS tablet, Take 2,000 Units by mouth daily., Disp: , Rfl:  .  Cranberry 500 MG TABS, Take 2 tablets by mouth daily., Disp: , Rfl:  .  doxazosin (CARDURA) 1 MG tablet, Take 1 tablet (1 mg total) by mouth every evening., Disp: 90 tablet, Rfl: 3 .  ELIQUIS 5 MG TABS tablet, TAKE 1 TABLET(5 MG) BY MOUTH TWICE DAILY, Disp: 60 tablet, Rfl: 5 .  furosemide (LASIX) 20 MG tablet, Take 1 tablet (20 mg total) by mouth See admin instructions. Take 1 tablet (20mg ) by mouth every morning - take 1 additional tablet (20mg ) after lunch if needed for swelling, Disp: 120 tablet, Rfl: 3 .  levothyroxine (SYNTHROID) 137 MCG tablet, TAKE 1 TABLET(137 MCG) BY MOUTH EVERY MORNING, Disp: 90 tablet, Rfl: 1 .  metoprolol succinate (TOPROL-XL) 50 MG 24 hr tablet, Take with or immediately following a meal., Disp: 90 tablet, Rfl: 3 .  Multiple Vitamin (MULITIVITAMIN WITH MINERALS) TABS, Take 1 tablet by mouth daily., Disp: , Rfl:  .  potassium chloride (KLOR-CON) 10 MEQ tablet, TAKE 1 TABLET BY MOUTH EVERY DAY AS DIRECTED. MAY TAKE AN EXTRA PILL AFTER LUNCH WITH FLUID PILL AS NEEDED FOR SWELLING, Disp: 135 tablet, Rfl: 3 .   rosuvastatin (CRESTOR) 40 MG tablet, TAKE 1 TABLET(40 MG) BY MOUTH DAILY, Disp: 90 tablet, Rfl: 3 .  temazepam (RESTORIL) 15 MG capsule, TAKE 1 CAPSULE(15 MG) BY MOUTH AT BEDTIME AS NEEDED FOR SLEEP, Disp: 30 capsule, Rfl: 0 .  valsartan (DIOVAN) 160 MG tablet, Take 1 tablet (160 mg total) by mouth daily. Take in the morning, Disp: 90 tablet, Rfl: 3  Allergies  Allergen Reactions  . Chocolate Swelling  . Penicillins Swelling and Rash    Has patient had a PCN reaction causing immediate rash, facial/tongue/throat swelling, SOB or lightheadedness with hypotension: Yes Has patient had a PCN reaction causing severe rash involving mucus membranes or skin necrosis: No Has patient had a PCN reaction that required hospitalization: No Has patient had a PCN reaction occurring within the last 10 years: No If all of the above answers are "NO", then may proceed with Cephalosporin use.   . Codeine Nausea And Vomiting  . Erythromycin Itching and Swelling  . Septra [Sulfamethoxazole-Trimethoprim] Swelling    I personally reviewed active problem list, medication list, allergies, imaging, other hospital discharge summary and labs  with the patient/caregiver today.   ROS  Ten systems reviewed and is negative except as mentioned in HPI   Objective  Virtual encounter, vitals obtained at home  Today's Vitals   11/18/20 0823  BP: (!) 139/57  Pulse: 67  Temp: 97.8 F (36.6 C)  SpO2: 94%   There is no height or weight on file to calculate BMI.   Physical Exam  Awake, alert and oriented, speaking in full sentences, did not cough during the visit   PHQ2/9: Depression screen [facility] 2/9 11/18/2020 09/13/2020 09/13/2020 08/02/2020 05/25/2020  Decreased Interest 0 0 0 0 0  Down, Depressed, Hopeless 0 0 0 0 0  PHQ - 2 Score 0 0 0 0 0  Altered sleeping - 0 - - -  Tired, decreased energy - 1 - - -  Change in appetite - 0 - - -  Feeling bad or failure about yourself  - 0 - - -  Trouble concentrating - 0 -  - -  Moving slowly or fidgety/restless - 0 - - -  Suicidal thoughts - 0 - - -  PHQ-9 Score - 1 - - -  Difficult doing work/chores - - - - -  Some recent data might be hidden   PHQ-2/9 Result is negative.    Fall Risk: Fall Risk  11/18/2020 09/13/2020 08/02/2020 05/25/2020 04/02/2020  Falls in the past year? 0 0 0 0 0  Number falls in past yr: 0 0 0 0 0  Injury with Fall? 0 0 0 0 0  Comment - - - - -  Risk for fall due to : - - - No Fall Risks -  Risk for fall due to: Comment - - - - -  Follow up - - - Falls prevention discussed -    Assessment & Plan  1. Cough  - CBC with Differential/Platelet; Future - Comprehensive metabolic panel; Future - C-reactive protein; Future - Brain natriuretic peptide; Future - DG Chest 2 View; Future - D-Dimer, Quantitative; Future - Novel Coronavirus, NAA ([facility])  2. SOB (shortness of breath)  - CBC with Differential/Platelet; Future - Comprehensive metabolic panel; Future - C-reactive protein; Future - Brain natriuretic peptide; Future - DG Chest 2 View; Future - D-Dimer, Quantitative; Future - Novel Coronavirus, NAA ([facility])  3. History of pneumonia  - CBC with Differential/Platelet; Future - Comprehensive metabolic panel; Future - C-reactive protein; Future - Brain natriuretic peptide; Future - DG Chest 2 View; Future - D-Dimer, Quantitative; Future  I discussed the assessment and treatment plan with the patient. The patient was provided an opportunity to ask questions and all were answered. The patient agreed with the plan and demonstrated an understanding of the instructions.   The patient was advised to call back or seek an in-person evaluation if the symptoms worsen or if the condition fails to improve as anticipated.  I provided 25 minutes of non-face-to-face time during this encounter.

## 2020-11-18 NOTE — Progress Notes (Signed)
Called to see pt for IV contrast infiltration RUE.Mild to mod inliltration.No skin breakdown.Post infiltration instructions given to Pt.Pt instructed to keep arm elevated and to apply ice packs.Pt  Instructed to come to ER if there is any worsenening of swelling or other sx's.

## 2020-11-19 ENCOUNTER — Inpatient Hospital Stay
Admission: EM | Admit: 2020-11-19 | Discharge: 2020-11-24 | DRG: 871 | Disposition: A | Discharge status: Home Health | Payer: Medicare Other | Attending: Internal Medicine | Admitting: Internal Medicine

## 2020-11-19 ENCOUNTER — Other Ambulatory Visit: Payer: Self-pay | Admitting: Family Medicine

## 2020-11-19 ENCOUNTER — Other Ambulatory Visit: Payer: Self-pay

## 2020-11-19 DIAGNOSIS — Z885 Allergy status to narcotic agent status: Secondary | ICD-10-CM

## 2020-11-19 DIAGNOSIS — J690 Pneumonitis due to inhalation of food and vomit: Secondary | ICD-10-CM | POA: Diagnosis present

## 2020-11-19 DIAGNOSIS — Z20822 Contact with and (suspected) exposure to covid-19: Secondary | ICD-10-CM | POA: Diagnosis present

## 2020-11-19 DIAGNOSIS — I447 Left bundle-branch block, unspecified: Secondary | ICD-10-CM | POA: Diagnosis present

## 2020-11-19 DIAGNOSIS — I480 Paroxysmal atrial fibrillation: Secondary | ICD-10-CM | POA: Diagnosis present

## 2020-11-19 DIAGNOSIS — R0602 Shortness of breath: Secondary | ICD-10-CM

## 2020-11-19 DIAGNOSIS — J9601 Acute respiratory failure with hypoxia: Secondary | ICD-10-CM | POA: Diagnosis not present

## 2020-11-19 DIAGNOSIS — Z79890 Hormone replacement therapy (postmenopausal): Secondary | ICD-10-CM

## 2020-11-19 DIAGNOSIS — E785 Hyperlipidemia, unspecified: Secondary | ICD-10-CM | POA: Diagnosis present

## 2020-11-19 DIAGNOSIS — R069 Unspecified abnormalities of breathing: Secondary | ICD-10-CM | POA: Diagnosis not present

## 2020-11-19 DIAGNOSIS — R6520 Severe sepsis without septic shock: Secondary | ICD-10-CM | POA: Diagnosis not present

## 2020-11-19 DIAGNOSIS — Z803 Family history of malignant neoplasm of breast: Secondary | ICD-10-CM

## 2020-11-19 DIAGNOSIS — Z881 Allergy status to other antibiotic agents status: Secondary | ICD-10-CM

## 2020-11-19 DIAGNOSIS — Z823 Family history of stroke: Secondary | ICD-10-CM

## 2020-11-19 DIAGNOSIS — Z820 Family history of epilepsy and other diseases of the nervous system: Secondary | ICD-10-CM

## 2020-11-19 DIAGNOSIS — I4819 Other persistent atrial fibrillation: Secondary | ICD-10-CM | POA: Diagnosis present

## 2020-11-19 DIAGNOSIS — M47812 Spondylosis without myelopathy or radiculopathy, cervical region: Secondary | ICD-10-CM | POA: Diagnosis present

## 2020-11-19 DIAGNOSIS — A419 Sepsis, unspecified organism: Secondary | ICD-10-CM | POA: Diagnosis not present

## 2020-11-19 DIAGNOSIS — J129 Viral pneumonia, unspecified: Secondary | ICD-10-CM

## 2020-11-19 DIAGNOSIS — F419 Anxiety disorder, unspecified: Secondary | ICD-10-CM | POA: Diagnosis present

## 2020-11-19 DIAGNOSIS — Z91018 Allergy to other foods: Secondary | ICD-10-CM

## 2020-11-19 DIAGNOSIS — R059 Cough, unspecified: Secondary | ICD-10-CM

## 2020-11-19 DIAGNOSIS — I130 Hypertensive heart and chronic kidney disease with heart failure and stage 1 through stage 4 chronic kidney disease, or unspecified chronic kidney disease: Secondary | ICD-10-CM | POA: Diagnosis present

## 2020-11-19 DIAGNOSIS — N183 Chronic kidney disease, stage 3 unspecified: Secondary | ICD-10-CM | POA: Diagnosis present

## 2020-11-19 DIAGNOSIS — D631 Anemia in chronic kidney disease: Secondary | ICD-10-CM | POA: Diagnosis present

## 2020-11-19 DIAGNOSIS — J189 Pneumonia, unspecified organism: Secondary | ICD-10-CM | POA: Diagnosis present

## 2020-11-19 DIAGNOSIS — Z743 Need for continuous supervision: Secondary | ICD-10-CM | POA: Diagnosis not present

## 2020-11-19 DIAGNOSIS — Z79899 Other long term (current) drug therapy: Secondary | ICD-10-CM

## 2020-11-19 DIAGNOSIS — Z8349 Family history of other endocrine, nutritional and metabolic diseases: Secondary | ICD-10-CM

## 2020-11-19 DIAGNOSIS — E876 Hypokalemia: Secondary | ICD-10-CM | POA: Diagnosis not present

## 2020-11-19 DIAGNOSIS — R6889 Other general symptoms and signs: Secondary | ICD-10-CM | POA: Diagnosis not present

## 2020-11-19 DIAGNOSIS — I5022 Chronic systolic (congestive) heart failure: Secondary | ICD-10-CM | POA: Diagnosis present

## 2020-11-19 DIAGNOSIS — E039 Hypothyroidism, unspecified: Secondary | ICD-10-CM | POA: Diagnosis present

## 2020-11-19 DIAGNOSIS — Z7901 Long term (current) use of anticoagulants: Secondary | ICD-10-CM

## 2020-11-19 DIAGNOSIS — Z880 Allergy status to penicillin: Secondary | ICD-10-CM

## 2020-11-19 DIAGNOSIS — Z8051 Family history of malignant neoplasm of kidney: Secondary | ICD-10-CM

## 2020-11-19 LAB — NOVEL CORONAVIRUS, NAA: SARS-CoV-2, NAA: NOT DETECTED

## 2020-11-19 LAB — SARS-COV-2, NAA 2 DAY TAT

## 2020-11-19 LAB — PROCALCITONIN: Procalcitonin: <0.1 ng/mL

## 2020-11-19 NOTE — ED Triage Notes (Signed)
Pt arrives from home via [facility] with complaint of resp distress. Pt recently diagnosed with pneumonia  (approx 3 weeks ago, seen and d/c'd here yesterday). Pt reports worsening overnight.   Pt a/o x4  Pt RA 77% per EMS, 95% on 14L [facility]/[place]

## 2020-11-19 NOTE — Progress Notes (Signed)
Lab orders

## 2020-11-19 NOTE — Progress Notes (Signed)
Called patient to f/u for extrav of IV contrast during her CT scan on 1/27. Patient states her arm is feeling much better today and the swelling is almost gone. Instructed her to call if she has any changes.

## 2020-11-20 DIAGNOSIS — J9601 Acute respiratory failure with hypoxia: Secondary | ICD-10-CM

## 2020-11-20 DIAGNOSIS — Z880 Allergy status to penicillin: Secondary | ICD-10-CM | POA: Diagnosis not present

## 2020-11-20 DIAGNOSIS — I5022 Chronic systolic (congestive) heart failure: Secondary | ICD-10-CM | POA: Diagnosis not present

## 2020-11-20 DIAGNOSIS — J189 Pneumonia, unspecified organism: Secondary | ICD-10-CM

## 2020-11-20 DIAGNOSIS — I10 Essential (primary) hypertension: Secondary | ICD-10-CM

## 2020-11-20 DIAGNOSIS — R6520 Severe sepsis without septic shock: Secondary | ICD-10-CM

## 2020-11-20 DIAGNOSIS — Z885 Allergy status to narcotic agent status: Secondary | ICD-10-CM | POA: Diagnosis not present

## 2020-11-20 DIAGNOSIS — I447 Left bundle-branch block, unspecified: Secondary | ICD-10-CM | POA: Diagnosis not present

## 2020-11-20 DIAGNOSIS — A419 Sepsis, unspecified organism: Secondary | ICD-10-CM | POA: Diagnosis not present

## 2020-11-20 DIAGNOSIS — Z881 Allergy status to other antibiotic agents status: Secondary | ICD-10-CM | POA: Diagnosis not present

## 2020-11-20 DIAGNOSIS — I480 Paroxysmal atrial fibrillation: Secondary | ICD-10-CM | POA: Diagnosis not present

## 2020-11-20 DIAGNOSIS — R0602 Shortness of breath: Secondary | ICD-10-CM | POA: Diagnosis present

## 2020-11-20 DIAGNOSIS — E039 Hypothyroidism, unspecified: Secondary | ICD-10-CM | POA: Diagnosis not present

## 2020-11-20 DIAGNOSIS — Z79890 Hormone replacement therapy (postmenopausal): Secondary | ICD-10-CM | POA: Diagnosis not present

## 2020-11-20 DIAGNOSIS — E876 Hypokalemia: Secondary | ICD-10-CM | POA: Diagnosis not present

## 2020-11-20 DIAGNOSIS — D72829 Elevated white blood cell count, unspecified: Secondary | ICD-10-CM | POA: Diagnosis not present

## 2020-11-20 DIAGNOSIS — Z7901 Long term (current) use of anticoagulants: Secondary | ICD-10-CM | POA: Diagnosis not present

## 2020-11-20 DIAGNOSIS — D631 Anemia in chronic kidney disease: Secondary | ICD-10-CM | POA: Diagnosis not present

## 2020-11-20 DIAGNOSIS — I4819 Other persistent atrial fibrillation: Secondary | ICD-10-CM | POA: Diagnosis not present

## 2020-11-20 DIAGNOSIS — M47812 Spondylosis without myelopathy or radiculopathy, cervical region: Secondary | ICD-10-CM | POA: Diagnosis not present

## 2020-11-20 DIAGNOSIS — N183 Chronic kidney disease, stage 3 unspecified: Secondary | ICD-10-CM | POA: Diagnosis not present

## 2020-11-20 DIAGNOSIS — Z20822 Contact with and (suspected) exposure to covid-19: Secondary | ICD-10-CM | POA: Diagnosis not present

## 2020-11-20 DIAGNOSIS — E785 Hyperlipidemia, unspecified: Secondary | ICD-10-CM | POA: Diagnosis not present

## 2020-11-20 DIAGNOSIS — Z79899 Other long term (current) drug therapy: Secondary | ICD-10-CM | POA: Diagnosis not present

## 2020-11-20 DIAGNOSIS — J690 Pneumonitis due to inhalation of food and vomit: Secondary | ICD-10-CM | POA: Diagnosis not present

## 2020-11-20 DIAGNOSIS — F419 Anxiety disorder, unspecified: Secondary | ICD-10-CM | POA: Diagnosis present

## 2020-11-20 DIAGNOSIS — Z820 Family history of epilepsy and other diseases of the nervous system: Secondary | ICD-10-CM | POA: Diagnosis not present

## 2020-11-20 DIAGNOSIS — Z91018 Allergy to other foods: Secondary | ICD-10-CM | POA: Diagnosis not present

## 2020-11-20 DIAGNOSIS — I130 Hypertensive heart and chronic kidney disease with heart failure and stage 1 through stage 4 chronic kidney disease, or unspecified chronic kidney disease: Secondary | ICD-10-CM | POA: Diagnosis not present

## 2020-11-20 LAB — CBC WITH DIFFERENTIAL/PLATELET
Abs Immature Granulocytes: 0.06 10*3/uL (ref 0.00–0.07)
Basophils Absolute: 0 10*3/uL (ref 0.0–0.1)
Basophils Relative: 0 %
Eosinophils Absolute: 0.1 10*3/uL (ref 0.0–0.5)
Eosinophils Relative: 1 %
HCT: 32 % — ABNORMAL LOW (ref 36.0–46.0)
Hemoglobin: 10.6 g/dL — ABNORMAL LOW (ref 12.0–15.0)
Immature Granulocytes: 1 %
Lymphocytes Relative: 5 %
Lymphs Abs: 0.7 10*3/uL (ref 0.7–4.0)
MCH: 30.7 pg (ref 26.0–34.0)
MCHC: 33.1 g/dL (ref 30.0–36.0)
MCV: 92.8 fL (ref 80.0–100.0)
Monocytes Absolute: 0.6 10*3/uL (ref 0.1–1.0)
Monocytes Relative: 5 %
Neutro Abs: 11.3 10*3/uL — ABNORMAL HIGH (ref 1.7–7.7)
Neutrophils Relative %: 88 %
Platelets: 260 10*3/uL (ref 150–400)
RBC: 3.45 MIL/uL — ABNORMAL LOW (ref 3.87–5.11)
RDW: 14.6 % (ref 11.5–15.5)
WBC: 12.8 10*3/uL — ABNORMAL HIGH (ref 4.0–10.5)
nRBC: 0 % (ref 0.0–0.2)

## 2020-11-20 LAB — COMPREHENSIVE METABOLIC PANEL
ALT: 29 U/L (ref 0–44)
AST: 38 U/L (ref 15–41)
Albumin: 3 g/dL — ABNORMAL LOW (ref 3.5–5.0)
Alkaline Phosphatase: 86 U/L (ref 38–126)
Anion gap: 11 (ref 5–15)
BUN: 23 mg/dL (ref 8–23)
CO2: 25 mmol/L (ref 22–32)
Calcium: 8.8 mg/dL — ABNORMAL LOW (ref 8.9–10.3)
Chloride: 101 mmol/L (ref 98–111)
Creatinine, Ser: 1 mg/dL (ref 0.44–1.00)
GFR, Estimated: 58 mL/min — ABNORMAL LOW (ref >60)
Glucose, Bld: 155 mg/dL — ABNORMAL HIGH (ref 70–99)
Potassium: 3.6 mmol/L (ref 3.5–5.1)
Sodium: 137 mmol/L (ref 135–145)
Total Bilirubin: 0.7 mg/dL (ref 0.3–1.2)
Total Protein: 7.5 g/dL (ref 6.5–8.1)

## 2020-11-20 LAB — LACTIC ACID, PLASMA
Lactic Acid, Venous: 0.9 mmol/L (ref 0.5–1.9)
Lactic Acid, Venous: 1.3 mmol/L (ref 0.5–1.9)
Lactic Acid, Venous: 1.4 mmol/L (ref 0.5–1.9)

## 2020-11-20 LAB — RESP PANEL BY RT-PCR (FLU A&B, COVID) ARPGX2
Influenza A by PCR: NEGATIVE
Influenza B by PCR: NEGATIVE
SARS Coronavirus 2 by RT PCR: NEGATIVE

## 2020-11-20 LAB — BASIC METABOLIC PANEL
Anion gap: 11 (ref 5–15)
BUN: 21 mg/dL (ref 8–23)
CO2: 22 mmol/L (ref 22–32)
Calcium: 8.3 mg/dL — ABNORMAL LOW (ref 8.9–10.3)
Chloride: 104 mmol/L (ref 98–111)
Creatinine, Ser: 0.83 mg/dL (ref 0.44–1.00)
GFR, Estimated: >60 mL/min (ref >60)
Glucose, Bld: 175 mg/dL — ABNORMAL HIGH (ref 70–99)
Potassium: 3.8 mmol/L (ref 3.5–5.1)
Sodium: 137 mmol/L (ref 135–145)

## 2020-11-20 LAB — TROPONIN I (HIGH SENSITIVITY)
Troponin I (High Sensitivity): 13 ng/L (ref <18)
Troponin I (High Sensitivity): 39 ng/L — ABNORMAL HIGH (ref <18)

## 2020-11-20 LAB — PROTIME-INR
INR: 1.4 — ABNORMAL HIGH (ref 0.8–1.2)
Prothrombin Time: 16.9 seconds — ABNORMAL HIGH (ref 11.4–15.2)

## 2020-11-20 LAB — CBC
HCT: 27.7 % — ABNORMAL LOW (ref 36.0–46.0)
Hemoglobin: 9 g/dL — ABNORMAL LOW (ref 12.0–15.0)
MCH: 30.4 pg (ref 26.0–34.0)
MCHC: 32.5 g/dL (ref 30.0–36.0)
MCV: 93.6 fL (ref 80.0–100.0)
Platelets: 234 10*3/uL (ref 150–400)
RBC: 2.96 MIL/uL — ABNORMAL LOW (ref 3.87–5.11)
RDW: 14.6 % (ref 11.5–15.5)
WBC: 13.8 10*3/uL — ABNORMAL HIGH (ref 4.0–10.5)
nRBC: 0 % (ref 0.0–0.2)

## 2020-11-20 LAB — BRAIN NATRIURETIC PEPTIDE: B Natriuretic Peptide: 207.8 pg/mL — ABNORMAL HIGH (ref 0.0–100.0)

## 2020-11-20 LAB — SARS CORONAVIRUS 2 BY RT PCR (HOSPITAL ORDER, PERFORMED IN ~~LOC~~ HOSPITAL LAB): SARS Coronavirus 2: NEGATIVE

## 2020-11-20 LAB — STREP PNEUMONIAE URINARY ANTIGEN: Strep Pneumo Urinary Antigen: NEGATIVE

## 2020-11-20 MED ORDER — ACETAMINOPHEN 650 MG RE SUPP: 650.0000 mg | Freq: Four times a day (QID) | RECTAL | Status: DC | PRN

## 2020-11-20 MED ORDER — METOPROLOL SUCCINATE ER 50 MG PO TB24
50.0000 mg | ORAL_TABLET | Freq: Every day | ORAL | Status: DC
  Administered 2020-11-20 – 2020-11-24 (×5): 50 mg via ORAL
  Filled 2020-11-20 (×5): qty 1

## 2020-11-20 MED ORDER — METHYLPREDNISOLONE SODIUM SUCC 125 MG IJ SOLR
125.0000 mg | Freq: Once | INTRAMUSCULAR | Status: AC
  Administered 2020-11-20: 125 mg via INTRAVENOUS
  Filled 2020-11-20: qty 2

## 2020-11-20 MED ORDER — LACTATED RINGERS IV BOLUS (SEPSIS)
1000.0000 mL | Freq: Once | INTRAVENOUS | Status: AC
  Administered 2020-11-20: 1000 mL via INTRAVENOUS

## 2020-11-20 MED ORDER — SODIUM CHLORIDE 0.9 % IV SOLN
1.0000 g | Freq: Once | INTRAVENOUS | Status: AC
  Administered 2020-11-20: 1 g via INTRAVENOUS
  Filled 2020-11-20: qty 1

## 2020-11-20 MED ORDER — METHYLPREDNISOLONE SODIUM SUCC 40 MG IJ SOLR
40.0000 mg | Freq: Three times a day (TID) | INTRAMUSCULAR | Status: DC
  Administered 2020-11-20 – 2020-11-21 (×3): 40 mg via INTRAVENOUS
  Filled 2020-11-20 (×3): qty 1

## 2020-11-20 MED ORDER — VANCOMYCIN HCL 1500 MG/300ML IV SOLN
1500.0000 mg | Freq: Once | INTRAVENOUS | Status: AC
  Administered 2020-11-20: 1500 mg via INTRAVENOUS
  Filled 2020-11-20: qty 300

## 2020-11-20 MED ORDER — AMIODARONE HCL 200 MG PO TABS
200.0000 mg | ORAL_TABLET | Freq: Every day | ORAL | Status: DC
  Administered 2020-11-20 – 2020-11-24 (×5): 200 mg via ORAL
  Filled 2020-11-20 (×6): qty 1

## 2020-11-20 MED ORDER — IPRATROPIUM-ALBUTEROL 0.5-2.5 (3) MG/3ML IN SOLN
3.0000 mL | Freq: Four times a day (QID) | RESPIRATORY_TRACT | Status: DC
  Administered 2020-11-20 – 2020-11-24 (×14): 3 mL via RESPIRATORY_TRACT
  Filled 2020-11-20 (×15): qty 3

## 2020-11-20 MED ORDER — DOXAZOSIN MESYLATE 1 MG PO TABS
1.0000 mg | ORAL_TABLET | Freq: Every evening | ORAL | Status: DC
  Administered 2020-11-20 – 2020-11-23 (×4): 1 mg via ORAL
  Filled 2020-11-20 (×6): qty 1

## 2020-11-20 MED ORDER — MAGNESIUM HYDROXIDE 400 MG/5ML PO SUSP: 30.0000 mL | Freq: Every day | ORAL | Status: DC | PRN

## 2020-11-20 MED ORDER — SODIUM CHLORIDE 0.9 % IV SOLN: 1.0000 g | Freq: Once | INTRAVENOUS | Status: DC

## 2020-11-20 MED ORDER — FUROSEMIDE 20 MG PO TABS
20.0000 mg | ORAL_TABLET | Freq: Every morning | ORAL | Status: DC
  Administered 2020-11-20 – 2020-11-24 (×5): 20 mg via ORAL
  Filled 2020-11-20 (×4): qty 1

## 2020-11-20 MED ORDER — ACETAMINOPHEN 325 MG PO TABS
650.0000 mg | ORAL_TABLET | Freq: Four times a day (QID) | ORAL | Status: DC | PRN
  Administered 2020-11-23: 650 mg via ORAL
  Filled 2020-11-20: qty 2

## 2020-11-20 MED ORDER — TRAZODONE HCL 50 MG PO TABS: 25.0000 mg | ORAL_TABLET | Freq: Every evening | ORAL | Status: DC | PRN

## 2020-11-20 MED ORDER — ROSUVASTATIN CALCIUM 10 MG PO TABS
40.0000 mg | ORAL_TABLET | Freq: Every day | ORAL | Status: DC
  Administered 2020-11-20 – 2020-11-24 (×5): 40 mg via ORAL
  Filled 2020-11-20 (×2): qty 2
  Filled 2020-11-20: qty 4
  Filled 2020-11-20: qty 8
  Filled 2020-11-20: qty 2
  Filled 2020-11-20: qty 4

## 2020-11-20 MED ORDER — TEMAZEPAM 15 MG PO CAPS
15.0000 mg | ORAL_CAPSULE | Freq: Every evening | ORAL | Status: DC | PRN
  Administered 2020-11-23: 15 mg via ORAL
  Filled 2020-11-20: qty 1

## 2020-11-20 MED ORDER — LEVOFLOXACIN IN D5W 750 MG/150ML IV SOLN
750.0000 mg | INTRAVENOUS | Status: DC
  Administered 2020-11-20: 750 mg via INTRAVENOUS
  Filled 2020-11-20: qty 150

## 2020-11-20 MED ORDER — LEVOFLOXACIN IN D5W 750 MG/150ML IV SOLN
750.0000 mg | INTRAVENOUS | Status: AC
Start: 2020-11-21 — End: 2020-11-24
  Administered 2020-11-21 – 2020-11-24 (×4): 750 mg via INTRAVENOUS
  Filled 2020-11-20 (×4): qty 150

## 2020-11-20 MED ORDER — SODIUM CHLORIDE 0.9 % IV SOLN: INTRAVENOUS | Status: DC

## 2020-11-20 MED ORDER — ONDANSETRON HCL 4 MG PO TABS: 4.0000 mg | ORAL_TABLET | Freq: Four times a day (QID) | ORAL | Status: DC | PRN

## 2020-11-20 MED ORDER — APIXABAN 5 MG PO TABS
5.0000 mg | ORAL_TABLET | Freq: Two times a day (BID) | ORAL | Status: DC
  Administered 2020-11-20 – 2020-11-24 (×9): 5 mg via ORAL
  Filled 2020-11-20 (×9): qty 1

## 2020-11-20 MED ORDER — LEVOTHYROXINE SODIUM 137 MCG PO TABS
137.0000 ug | ORAL_TABLET | Freq: Every day | ORAL | Status: DC
  Administered 2020-11-20 – 2020-11-24 (×5): 137 ug via ORAL
  Filled 2020-11-20 (×5): qty 1

## 2020-11-20 MED ORDER — FUROSEMIDE 20 MG PO TABS
20.0000 mg | ORAL_TABLET | Freq: Every day | ORAL | Status: DC | PRN
  Filled 2020-11-20: qty 1

## 2020-11-20 MED ORDER — ENOXAPARIN SODIUM 40 MG/0.4ML ~~LOC~~ SOLN
40.0000 mg | SUBCUTANEOUS | Status: DC
  Administered 2020-11-20: 40 mg via SUBCUTANEOUS
  Filled 2020-11-20: qty 0.4

## 2020-11-20 MED ORDER — FUROSEMIDE 40 MG PO TABS: 20.0000 mg | ORAL_TABLET | ORAL | Status: DC

## 2020-11-20 MED ORDER — IPRATROPIUM-ALBUTEROL 0.5-2.5 (3) MG/3ML IN SOLN
3.0000 mL | RESPIRATORY_TRACT | Status: DC | PRN
  Administered 2020-11-20: 3 mL via RESPIRATORY_TRACT
  Filled 2020-11-20: qty 3

## 2020-11-20 MED ORDER — VANCOMYCIN HCL IN DEXTROSE 1-5 GM/200ML-% IV SOLN: 1000.0000 mg | Freq: Once | INTRAVENOUS | Status: DC

## 2020-11-20 MED ORDER — IRBESARTAN 150 MG PO TABS
150.0000 mg | ORAL_TABLET | Freq: Every day | ORAL | Status: DC
  Administered 2020-11-20 – 2020-11-24 (×5): 150 mg via ORAL
  Filled 2020-11-20 (×6): qty 1

## 2020-11-20 MED ORDER — ONDANSETRON HCL 4 MG/2ML IJ SOLN: 4.0000 mg | Freq: Four times a day (QID) | INTRAMUSCULAR | Status: DC | PRN

## 2020-11-20 MED ORDER — LEVOFLOXACIN IN D5W 750 MG/150ML IV SOLN: 750.0000 mg | Freq: Once | INTRAVENOUS | Status: DC

## 2020-11-20 MED ORDER — IPRATROPIUM-ALBUTEROL 0.5-2.5 (3) MG/3ML IN SOLN: 3.0000 mL | Freq: Four times a day (QID) | RESPIRATORY_TRACT | Status: DC

## 2020-11-20 MED ORDER — GUAIFENESIN ER 600 MG PO TB12
600.0000 mg | ORAL_TABLET | Freq: Two times a day (BID) | ORAL | Status: DC
  Administered 2020-11-20 – 2020-11-24 (×10): 600 mg via ORAL
  Filled 2020-11-20 (×10): qty 1

## 2020-11-20 NOTE — Sepsis Progress Note (Signed)
Sepsis canceled. All criteria met

## 2020-11-20 NOTE — ED Notes (Signed)
Pt c/o cough, will give nebulizer at this time see eMAR.

## 2020-11-20 NOTE — ED Notes (Signed)
Spoke with attending re: pt asked if she should be on Eliquis BID because she takes this at home. Is receiving Lovenox. Eliquis will be ordered by doc.  Diet order put in per pt request and cleared by Dr Starla Link.

## 2020-11-20 NOTE — Sepsis Progress Note (Signed)
Following for sepsis monitoring ?

## 2020-11-20 NOTE — H&P (Signed)
[place]   PATIENT NAME: [name]    MR#:  [id number]  DATE OF BIRTH:  08-23-1941  DATE OF ADMISSION:  11/19/2020  PRIMARY CARE PHYSICIAN: Steele Sizer, MD   REQUESTING/REFERRING PHYSICIAN: Hinda Kehr, MD  CHIEF COMPLAINT:   Chief Complaint  Patient presents with  . Shortness of Breath    HISTORY OF PRESENT ILLNESS:  [name]  is a 80 y.o. Caucasian female with a known history of history of multiple medical problems that are mentioned below, presented to the emergency room with acute onset of worsening dyspnea and respiratory distress.  The patient was recently diagnosed with pneumonia approximately 3 weeks ago and admitted here on 1/8 and discharged on 11/02/2020.  She has been feeling well until the last 3 days when she started having worsening dyspnea with associated cough productive of mild clear sputum as well as mild wheezing and chills with no measured fever.  She denied any nausea or vomiting or diarrhea.  No loss of taste or smell.  No chest pain or palpitations.  Pulse extremity was down to 75--80% on room air.  Pulse oximetry was up to 95% on 14 L of O2 by nonrebreather.  She has been having increased generalized weakness with her symptoms.  Upon presentation to the ER, vital signs were within normal except for pulse oximetry that was 94% and later 96% on 8 L of O2 and later respiratory rate of 22.  Labs revealed a glucose of 155 with a albumin of 3 BNP of 207 and high C6 troponin I 30 and later 39.  Lactic acid was 1.3 and later 1.4.  CBC showed leukocytosis of 12.8 and anemia.  Blood culture was drawn.  COVID-19 PCR came back negative.  Chest x-ray showed multifocal pneumonia, decreased bilateral pleural effusions with a persistent small-to-moderate right pleural effusion and trace left pleural effusion, rounded lucency at the left lung base that may reflect an area of cavitation versus focal air trapping.  Recommendation was for close follow-up. EKG showed  sinus rhythm with a rate of 82 with left bundle branch block.  The patient was given IV meropenem and vancomycin and 2 L bolus of IV lactated Ringer.  She will be admitted to medical monitored bed for further evaluation and management.  PAST MEDICAL HISTORY:   Past Medical History:  Diagnosis Date  . Anxiety   . Bronchitis   . Chronic combined systolic (congestive) and diastolic (congestive) heart failure ([place])    a. 02/2018 Echo: EF 45-50%, diff HK, triv AI, mod MR, mildly dil LA/RA, nl RV fxn, mod TR. PASP 40-22mmHg.  Marland Kitchen Chronic kidney disease   . DJD (degenerative joint disease), cervical   . History of stress test    a. 01/2008 MV: EF 76%. Fair ex tol. No ischemia.  . Hyperlipidemia   . Hypertension   . Hypothyroid   . LBBB (left bundle branch block)   . Leg pain    a. 01/2018 ABI's wnl.  . Lumbar spondylosis   . Persistent atrial fibrillation ([place])    a. 01/2018 Event monitor: 45 runs of SVT, longest 14.5 sec. SVT felt to be Afib/flutter-->2% burden. Longest run of AF 4h 73m (CHA2DS2VASc = 5-->Xarelto); b. 03/2019 s/p DCCV; c. 04/2019 Repeat Amio load and DCCV.  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  BACK SURGERY  07/2019  . CARDIOVERSION N/A 03/26/2018   Procedure: CARDIOVERSION;  Surgeon: Nelva Bush, MD;  Location: [facility] ORS;  Service: Cardiovascular;  Laterality: N/A;  . CARDIOVERSION N/A 04/24/2018   Procedure: CARDIOVERSION;  Surgeon: Minna Merritts, MD;  Location: [facility] ORS;  Service: Cardiovascular;  Laterality: N/A;  . ELECTROPHYSIOLOGY STUDY N/A 02/27/2012   Procedure: ELECTROPHYSIOLOGY STUDY;  Surgeon: Evans Lance, MD;  Location: [facility] CATH LAB;  Service: Cardiovascular;  Laterality: N/A;  . EPS and ablation for SVT  5/13   slow pathway ablation by Dr Lovena Le  . EYE SURGERY     surgery for detatched retina  . NECK SURGERY  02/2020  .  SUPRAVENTRICULAR TACHYCARDIA ABLATION N/A 02/27/2012   Procedure: SUPRAVENTRICULAR TACHYCARDIA ABLATION;  Surgeon: Evans Lance, MD;  Location: [facility] CATH LAB;  Service: Cardiovascular;  Laterality: N/A;    SOCIAL HISTORY:   Social History   Tobacco Use  . Smoking status: Never Smoker  . Smokeless tobacco: Never Used  . Tobacco comment: smoking cessation materials not required  Substance Use Topics  . Alcohol use: No    FAMILY HISTORY:   Family History  Problem Relation Age of Onset  . Alzheimer's disease Mother   . Stroke Father   . Breast cancer Father   . Heart disease Father        Pacemaker  . Breast cancer Paternal Aunt   . Kidney cancer Maternal Grandmother   . Alzheimer's disease Maternal Grandfather   . Thyroid disease Paternal Grandmother   . Stroke Paternal Grandfather     DRUG ALLERGIES:   Allergies  Allergen Reactions  . Chocolate Swelling  . Penicillins Swelling and Rash    Has patient had a PCN reaction causing immediate rash, facial/tongue/throat swelling, SOB or lightheadedness with hypotension: Yes Has patient had a PCN reaction causing severe rash involving mucus membranes or skin necrosis: No Has patient had a PCN reaction that required hospitalization: No Has patient had a PCN reaction occurring within the last 10 years: No If all of the above answers are "NO", then may proceed with Cephalosporin use.   . Codeine Nausea And Vomiting  . Erythromycin Itching and Swelling  . Septra [Sulfamethoxazole-Trimethoprim] Swelling    REVIEW OF SYSTEMS:   ROS As per history of present illness. All pertinent systems were reviewed above. Constitutional, HEENT, cardiovascular, respiratory, GI, GU, musculoskeletal, neuro, psychiatric, endocrine, integumentary and hematologic systems were reviewed and are otherwise negative/unremarkable except for positive findings mentioned above in the HPI.   MEDICATIONS AT HOME:   Prior to Admission medications    Medication Sig Start Date End Date Taking? Authorizing Provider  acetaminophen (TYLENOL) 650 MG CR tablet Take 650-1,300 mg by mouth every 8 (eight) hours as needed for pain.     [provider]  albuterol (VENTOLIN HFA) 108 (90 Base) MCG/ACT inhaler Inhale 2 puffs into the lungs every 6 (six) hours as needed for wheezing or shortness of breath. 11/02/20   Loletha Grayer, MD  amiodarone (PACERONE) 200 MG tablet TAKE 1 TABLET(200 MG) BY MOUTH DAILY 11/05/20   Minna Merritts, MD  busPIRone (BUSPAR) 5 MG tablet Take 1 tablet (5 mg total) by mouth 2 (two) times daily. 08/02/20   Steele Sizer, MD  cholecalciferol (VITAMIN D) 1000 UNITS tablet Take 2,000 Units by mouth daily.    [provider]  Cranberry 500 MG TABS Take 2 tablets by mouth daily.    [provider]  doxazosin (CARDURA) 1 MG tablet  Take 1 tablet (1 mg total) by mouth every evening. 11/05/20   Minna Merritts, MD  ELIQUIS 5 MG TABS tablet TAKE 1 TABLET(5 MG) BY MOUTH TWICE DAILY 06/01/20   Steele Sizer, MD  furosemide (LASIX) 20 MG tablet Take 1 tablet (20 mg total) by mouth See admin instructions. Take 1 tablet (20mg ) by mouth every morning - take 1 additional tablet (20mg ) after lunch if needed for swelling 11/05/20   Minna Merritts, MD  levothyroxine (SYNTHROID) 137 MCG tablet TAKE 1 TABLET(137 MCG) BY MOUTH EVERY MORNING 05/26/20   Ancil Boozer, Drue Stager, MD  metoprolol succinate (TOPROL-XL) 50 MG 24 hr tablet Take with or immediately following a meal. 11/05/20   Gollan, Kathlene November, MD  Multiple Vitamin (MULITIVITAMIN WITH MINERALS) TABS Take 1 tablet by mouth daily.    [provider]  potassium chloride (KLOR-CON) 10 MEQ tablet TAKE 1 TABLET BY MOUTH EVERY DAY AS DIRECTED. MAY TAKE AN EXTRA PILL AFTER LUNCH WITH FLUID PILL AS NEEDED FOR SWELLING 11/05/20   Gollan, Kathlene November, MD  rosuvastatin (CRESTOR) 40 MG tablet TAKE 1 TABLET(40 MG) BY MOUTH DAILY 11/05/20   Gollan, Kathlene November, MD  temazepam  (RESTORIL) 15 MG capsule TAKE 1 CAPSULE(15 MG) BY MOUTH AT BEDTIME AS NEEDED FOR SLEEP 09/13/20   Ancil Boozer, Drue Stager, MD  valsartan (DIOVAN) 160 MG tablet Take 1 tablet (160 mg total) by mouth daily. Take in the morning 11/05/20   Minna Merritts, MD      VITAL SIGNS:  Blood pressure (!) 146/53, pulse 74, temperature 98 F (36.7 C), temperature source Oral, resp. rate (!) 28, height 5\' 6"  (1.676 m), weight 60.8 kg, SpO2 99 %.  PHYSICAL EXAMINATION:  Physical Exam  GENERAL:  80 y.o.-year-old Caucasian female patient lying in the bed with mild respiratory stress with conversational dyspnea.   EYES: Pupils equal, round, reactive to light and accommodation. No scleral icterus. Extraocular muscles intact.  HEENT: Head atraumatic, normocephalic. Oropharynx and nasopharynx clear.  NECK:  Supple, no jugular venous distention. No thyroid enlargement, no tenderness.  LUNGS: Diminished bibasal breath sounds with bibasal crackles. CARDIOVASCULAR: Regular rate and rhythm, S1, S2 normal. No murmurs, rubs, or gallops.  ABDOMEN: Soft, nondistended, nontender. Bowel sounds present. No organomegaly or mass.  EXTREMITIES: No pedal edema, cyanosis, or clubbing.  NEUROLOGIC: Cranial nerves II through XII are intact. Muscle strength 5/5 in all extremities. Sensation intact. Gait not checked.  PSYCHIATRIC: The patient is alert and oriented x 3.  Normal affect and good eye contact. SKIN: No obvious rash, lesion, or ulcer.   LABORATORY PANEL:   CBC Recent Labs  Lab 11/19/20 2338  WBC 12.8*  HGB 10.6*  HCT 32.0*  PLT 260   ------------------------------------------------------------------------------------------------------------------  Chemistries  Recent Labs  Lab 11/19/20 2338  NA 137  K 3.6  CL 101  CO2 25  GLUCOSE 155*  BUN 23  CREATININE 1.00  CALCIUM 8.8*  AST 38  ALT 29  ALKPHOS 86  BILITOT 0.7    ------------------------------------------------------------------------------------------------------------------  Cardiac Enzymes No results for input(s): TROPONINI in the last 168 hours. ------------------------------------------------------------------------------------------------------------------  RADIOLOGY:  DG Chest 2 View  Result Date: 11/18/2020 CLINICAL DATA:  Cough EXAM: CHEST - 2 VIEW COMPARISON:  October 30, 2020 FINDINGS: The cardiomediastinal silhouette is unchanged in contour.Decreased bilateral pleural effusions with a persistent small to moderate RIGHT pleural effusion and trace LEFT pleural effusion. Biapical RIGHT greater than LEFT pleuroparenchymal scarring, unchanged. No pneumothorax. Patchy bilateral heterogeneous opacities with a peripheral predominance, mildly increased in  comparison to prior. Rounded lucency of the LEFT lung base. Visualized abdomen is unremarkable. Atherosclerotic calcifications of the aorta. Degenerative changes at the thoracolumbar junction. IMPRESSION: 1. Patchy bilateral heterogeneous opacities with a peripheral predominance, mildly increased in comparison to prior, concerning for multifocal pneumonia. COVID-19 infection remains in the differential. 2. Decreased bilateral pleural effusions with a persistent small to moderate RIGHT pleural effusion and trace LEFT pleural effusion. 3. There is a rounded lucency at the LEFT lung base which may reflect an area of cavitation versus focal air trapping. Recommend close attention on follow-up. Electronically Signed   By: Valentino Saxon MD   On: 11/18/2020 15:14   CT Angio Chest W/Cm &/Or Wo Cm  Result Date: 11/18/2020 CLINICAL DATA:  Shortness of breath EXAM: CT ANGIOGRAPHY CHEST WITH CONTRAST TECHNIQUE: Multidetector CT imaging of the chest was performed using the standard protocol during bolus administration of intravenous contrast. Multiplanar CT image reconstructions and MIPs were obtained to evaluate  the vascular anatomy. CONTRAST:  152mL OMNIPAQUE IOHEXOL 300 MG/ML  SOLN COMPARISON:  October 30, 2020 FINDINGS: Cardiovascular: Satisfactory opacification of the pulmonary arteries to the segmental level. No evidence of pulmonary embolism. Cardiomegaly. No pericardial effusion. Mild atherosclerotic calcifications of the thoracic aorta. Mediastinum/Nodes: No suspicious thyroid nodule. Revisualization of prominent hilar lymph nodes. RIGHT hilar lymph node measuring 14 mm in the short axis, unchanged when accounting for differences in technique (series 6, image 127). Lymph node in the AP window measures 9 mm in the short axis, unchanged (series 6, image 103). Lungs/Pleura: Small RIGHT and trace LEFT pleural effusions. No pneumothorax. Mild interlobular septal thickening. Improved aeration of bilateral lung bases with decreased atelectasis. Diffuse peripheral predominant centrilobular consolidative and ground-glass opacities, overall increased in comparison to prior in the RIGHT upper lobe but mildly decreased in comparison to prior in the LEFT upper lobe. There are more focal irregular nodular opacities which are unchanged, including a RIGHT middle lobe pulmonary nodule which measures 6 mm (series 6, image 175). Rounded lucency at the LEFT lung base seen on radiograph corresponds to several areas of air trapping. Upper Abdomen: Small hiatal hernia. Musculoskeletal: No acute osseous abnormality. Review of the MIP images confirms the above findings. IMPRESSION: 1. No evidence of acute pulmonary embolism. 2. Diffuse peripheral predominant consolidative and ground-glass opacities, overall increased in comparison to prior in the RIGHT upper lobe but mildly decreased in the LEFT upper lobe. Improved aeration of bilateral lung bases with decreased atelectasis. Findings are favored to represent worsening multifocal pneumonia with COVID 19 infection remaining on the differential. 3. Small RIGHT and trace LEFT pleural effusions.  Background of mild interlobular septal thickening may reflect a degree of pulmonary edema. 4. Revisualization of prominent hilar and mediastinal lymph nodes, likely reactive. 5. Rounded lucency at the LEFT lung base seen on radiograph corresponds to several areas of air trapping. 6. There are several more focal nodular opacities which are indeterminate. Recommend follow-up chest CT in 3-6 months to assess for resolution. Aortic Atherosclerosis (ICD10-I70.0). Electronically Signed   By: Valentino Saxon MD   On: 11/18/2020 15:56      IMPRESSION AND PLAN:   1.  Bacterial multifocal pneumonia.  I suspect streptococcal pneumonia.  Differential diagnosis would include influenza pneumonia. -The patient will be admitted to a medical monitored bed. -We will follow blood and sputum culture. -We will continue antibiotic therapy with IV Levaquin. -Mucolytic therapy as well as bronchodilator therapy will be provided. -O2 protocol will be followed. -We will check influenza antigens as  well as strept pneumo and Legionella urine antigens.  2.  Sepsis due to pneumonia as manifested by leukocytosis and tachypnea as above.  She meets severe sepsis criteria based on acute respiratory failure with hypoxia secondary to her multifocal pneumonia. -The patient will be hydrated with IV normal saline. -We will follow cultures as mentioned above and continue her on IV Levaquin. -We will follow lactic acid level.  3.  Hypothyroidism. -We will continue Synthroid and check TSH level.  4.  Dyslipidemia. -We will continue statin therapy.  5.  Essential hypertension. -Continue Diovan and Toprol-XL.  6.  Paroxysmal atrial fibrillation. -We will continue Toprol-XL and Eliquis.  7.  Anxiety. -We will continue BuSpar and Restoril.  8.  DVT prophylaxis. -Subcutaneous Lovenox.   All the records are reviewed and case discussed with ED provider. The plan of care was discussed in details with the patient (and  family). I answered all questions. The patient agreed to proceed with the above mentioned plan. Further management will depend upon hospital course.   CODE STATUS: Full code  Status is: Inpatient  Remains inpatient appropriate because:Hemodynamically unstable, Ongoing diagnostic testing needed not appropriate for outpatient work up, Unsafe d/c plan, IV treatments appropriate due to intensity of illness or inability to take PO and Inpatient level of care appropriate due to severity of illness   Dispo: The patient is from: Home              Anticipated d/c is to: Home              Anticipated d/c date is: 3 days              Patient currently is not medically stable to d/c.   Difficult to place patient No   TOTAL TIME TAKING CARE OF THIS PATIENT: 55 minutes.    Christel Mormon M.D on 11/20/2020 at 1:32 AM  [facility]   From 7 PM-7 AM, contact night-coverage [email or web address]  CC: Primary care physician; Steele Sizer, MD

## 2020-11-20 NOTE — Progress Notes (Signed)
PHARMACY -  BRIEF ANTIBIOTIC NOTE   Pharmacy has received consult(s) for Vancomycin and Meropenem from an ED provider for HCAP.  The patient's profile has been reviewed for ht/wt/allergies/indication/available labs.  Pt has known severe PCN allergy.  One time order(s) placed for Meropenem 1 gm and Vancomycin 1500 mg based on wt.  Further antibiotics/pharmacy consults should be ordered by admitting physician if indicated.                       Renda Rolls, PharmD, [facility] 11/20/2020 2:15 AM

## 2020-11-20 NOTE — ED Notes (Signed)
Took over care of pt. Pt resting comfortably and in NAD at this time. VSS. Awaiting further orders. Will continue to monitor.  

## 2020-11-20 NOTE — Progress Notes (Signed)
Patient ID: [name], female   DOB: February 14, 1941, 80 y.o.   MRN: [id number] Patient admitted early this morning for shortness of breath shortness of breath and found to have sepsis secondary to multifocal pneumonia and has been started on IV fluids and antibiotics.  Patient seen and examined at bedside and plan of care discussed with her.  I have reviewed patient medical records including this morning's H&P, current vitals, labs, medications myself.  Continue antibiotics.  Follow cultures.  COVID-19 PCR test on admission was negative.

## 2020-11-20 NOTE — ED Notes (Signed)
Pt given additional warm blanket and water per request.

## 2020-11-20 NOTE — ED Provider Notes (Signed)
[facility] Emergency Department Provider Note  ____________________________________________   Event Date/Time   First MD Initiated Contact with Patient 11/19/20 2332     (approximate)  I have reviewed the triage vital signs and the nursing notes.   HISTORY  Chief Complaint Shortness of Breath  Level 5 caveat:  history/ROS limited by acute/critical illness  HPI [name] is a 80 y.o. female with medical history as listed below who presents for evaluation of acute shortness of breath.  Paramedics report that the patient has been  treated in the past for pneumonia and she feels similar.  She has had 3 recent negative COVID-19 test including a PCR test from a couple of days ago.  She has had symptoms for a few days and her outpatient doctor ordered a CTA chest to rule out pulmonary embolism which she had yesterday.    When paramedics arrived tonight they found that her oxygen saturation was 75% on room air.  They put her on a nonrebreather which brought her up to the mid 90s.  She says she feels better upon arrival but is still short of breath.  She denies chest pain, nausea, vomiting, abdominal pain, fever/chills.  Nothing in particular makes the symptoms better and any amount of exertion or movement makes the symptoms worse.        Past Medical History:  Diagnosis Date  . Anxiety   . Bronchitis   . Chronic combined systolic (congestive) and diastolic (congestive) heart failure ([place])    a. 02/2018 Echo: EF 45-50%, diff HK, triv AI, mod MR, mildly dil LA/RA, nl RV fxn, mod TR. PASP 40-41mmHg.  Marland Kitchen Chronic kidney disease   . DJD (degenerative joint disease), cervical   . History of stress test    a. 01/2008 MV: EF 76%. Fair ex tol. No ischemia.  . Hyperlipidemia   . Hypertension   . Hypothyroid   . LBBB (left bundle branch block)   . Leg pain    a. 01/2018 ABI's wnl.  . Lumbar spondylosis   . Persistent atrial fibrillation ([place])    a. 01/2018 Event  monitor: 45 runs of SVT, longest 14.5 sec. SVT felt to be Afib/flutter-->2% burden. Longest run of AF 4h 80m (CHA2DS2VASc = 5-->Xarelto); b. 03/2019 s/p DCCV; c. 04/2019 Repeat Amio load and DCCV.  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 Severe sepsis ([place])   . Acquired thrombophilia ([place])   . Community acquired pneumonia 10/30/2020  . Acute respiratory failure ([place]) 10/30/2020  . Multifocal pneumonia 10/30/2020  . Pyelonephritis 03/30/2020  . Acute pyelonephritis 03/29/2020  . Hydronephrosis of right kidney 03/29/2020  . Depression 03/29/2020  . Chronic diastolic CHF (congestive heart failure) ([place]) 05/24/2018  . AF (paroxysmal atrial fibrillation) ([place]) 05/24/2018  . CKD (chronic kidney disease), stage III ([place]) 05/17/2018  . 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 Shortness of breath 05/07/2018  . Pulmonary hypertension, unspecified ([place]) 04/05/2018  . Acute on chronic combined systolic and diastolic CHF (congestive heart failure) ([place]) 04/05/2018  . Paroxysmal sinus tachycardia ([place]) 01/29/2018  . Leg pain 01/04/2018  . PAD (peripheral artery disease) ([place]) 01/04/2018  . Carotid artery calcification, bilateral 12/19/2017  . Cervical radiculitis 12/19/2017  . Hyperglycemia 03/06/2016  . Hypothyroid 04/21/2015  . 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 Anxiety 04/21/2015  . Hyperlipidemia 04/21/2015  .  Diuretic-induced hypokalemia 04/21/2015  . Palpitations 12/18/2012  . Hypertension 05/27/2012    Past Surgical History:  Procedure Laterality Date  . BACK SURGERY  07/2019  . CARDIOVERSION N/A 03/26/2018   Procedure: CARDIOVERSION;  Surgeon: Nelva Bush, MD;  Location: [facility] ORS;  Service: Cardiovascular;  Laterality: N/A;  . CARDIOVERSION N/A 04/24/2018   Procedure: CARDIOVERSION;  Surgeon: Minna Merritts, MD;  Location: [facility] ORS;  Service:  Cardiovascular;  Laterality: N/A;  . ELECTROPHYSIOLOGY STUDY N/A 02/27/2012   Procedure: ELECTROPHYSIOLOGY STUDY;  Surgeon: Evans Lance, MD;  Location: [facility] CATH LAB;  Service: Cardiovascular;  Laterality: N/A;  . EPS and ablation for SVT  5/13   slow pathway ablation by Dr Lovena Le  . EYE SURGERY     surgery for detatched retina  . NECK SURGERY  02/2020  . SUPRAVENTRICULAR TACHYCARDIA ABLATION N/A 02/27/2012   Procedure: SUPRAVENTRICULAR TACHYCARDIA ABLATION;  Surgeon: Evans Lance, MD;  Location: [facility] CATH LAB;  Service: Cardiovascular;  Laterality: N/A;    Prior to Admission medications   Medication Sig Start Date End Date Taking? Authorizing Provider  acetaminophen (TYLENOL) 650 MG CR tablet Take 650-1,300 mg by mouth every 8 (eight) hours as needed for pain.     [provider]  albuterol (VENTOLIN HFA) 108 (90 Base) MCG/ACT inhaler Inhale 2 puffs into the lungs every 6 (six) hours as needed for wheezing or shortness of breath. 11/02/20   Loletha Grayer, MD  amiodarone (PACERONE) 200 MG tablet TAKE 1 TABLET(200 MG) BY MOUTH DAILY 11/05/20   Minna Merritts, MD  busPIRone (BUSPAR) 5 MG tablet Take 1 tablet (5 mg total) by mouth 2 (two) times daily. 08/02/20   Steele Sizer, MD  cholecalciferol (VITAMIN D) 1000 UNITS tablet Take 2,000 Units by mouth daily.    [provider]  Cranberry 500 MG TABS Take 2 tablets by mouth daily.    [provider]  doxazosin (CARDURA) 1 MG tablet Take 1 tablet (1 mg total) by mouth every evening. 11/05/20   Minna Merritts, MD  ELIQUIS 5 MG TABS tablet TAKE 1 TABLET(5 MG) BY MOUTH TWICE DAILY 06/01/20   Steele Sizer, MD  furosemide (LASIX) 20 MG tablet Take 1 tablet (20 mg total) by mouth See admin instructions. Take 1 tablet (20mg ) by mouth every morning - take 1 additional tablet (20mg ) after lunch if needed for swelling 11/05/20   Minna Merritts, MD  levothyroxine (SYNTHROID) 137 MCG tablet TAKE 1 TABLET(137 MCG) BY MOUTH  EVERY MORNING 05/26/20   Ancil Boozer, Drue Stager, MD  metoprolol succinate (TOPROL-XL) 50 MG 24 hr tablet Take with or immediately following a meal. 11/05/20   Gollan, Kathlene November, MD  Multiple Vitamin (MULITIVITAMIN WITH MINERALS) TABS Take 1 tablet by mouth daily.    [provider]  potassium chloride (KLOR-CON) 10 MEQ tablet TAKE 1 TABLET BY MOUTH EVERY DAY AS DIRECTED. MAY TAKE AN EXTRA PILL AFTER LUNCH WITH FLUID PILL AS NEEDED FOR SWELLING 11/05/20   Gollan, Kathlene November, MD  rosuvastatin (CRESTOR) 40 MG tablet TAKE 1 TABLET(40 MG) BY MOUTH DAILY 11/05/20   Gollan, Kathlene November, MD  temazepam (RESTORIL) 15 MG capsule TAKE 1 CAPSULE(15 MG) BY MOUTH AT BEDTIME AS NEEDED FOR SLEEP 09/13/20   Ancil Boozer, Drue Stager, MD  valsartan (DIOVAN) 160 MG tablet Take 1 tablet (160 mg total) by mouth daily. Take in the morning 11/05/20   Minna Merritts, MD    Allergies Chocolate, Penicillins, Codeine, Erythromycin, and Septra [sulfamethoxazole-trimethoprim]  Family History  Problem Relation Age of Onset  . Alzheimer's disease Mother   . Stroke Father   . Breast cancer Father   . Heart disease Father        Pacemaker  . Breast cancer Paternal Aunt   . Kidney cancer Maternal Grandmother   . Alzheimer's disease Maternal Grandfather   . Thyroid disease Paternal Grandmother   . Stroke Paternal Grandfather     Social History Social History   Tobacco Use  . Smoking status: Never Smoker  . Smokeless tobacco: Never Used  . Tobacco comment: smoking cessation materials not required  Vaping Use  . Vaping Use: Never used  Substance Use Topics  . Alcohol use: No  . Drug use: No    Review of Systems Level 5 caveat:  history/ROS limited by acute/critical illness   constitutional: No fever/chills Eyes: No visual changes. ENT: No sore throat. Cardiovascular: Denies chest pain. Respiratory: Positive for shortness of breath/hypoxemia. Gastrointestinal: No abdominal pain.  No nausea, no vomiting.  No diarrhea.  No  constipation. Genitourinary: Negative for dysuria. Musculoskeletal: Negative for neck pain.  Negative for back pain. Integumentary: Negative for rash. Neurological: Negative for headaches, focal weakness or numbness.   ____________________________________________   PHYSICAL EXAM:  VITAL SIGNS: ED Triage Vitals  Enc Vitals Group     BP 11/20/20 0000 (!) 146/53     Pulse Rate 11/19/20 2331 83     Resp 11/19/20 2331 18     Temp 11/19/20 2331 98 F (36.7 C)     Temp Source 11/19/20 2331 Oral     SpO2 11/19/20 2330 96 %     Weight 11/19/20 2332 60.8 kg (134 lb)     Height 11/19/20 2332 1.676 m (5\' 6" )     Head Circumference --      Peak Flow --      Pain Score 11/19/20 2332 0     Pain Loc --      Pain Edu? --      Excl. in [place]? --     Constitutional: Alert and oriented.  Eyes: Conjunctivae are normal.  Head: Atraumatic. Nose: No congestion/rhinnorhea. Mouth/Throat: Patient is wearing a mask. Neck: No stridor.  No meningeal signs.   Cardiovascular: Normal rate, regular rhythm. Good peripheral circulation. Respiratory: Mild respiratory distress on a nonrebreather.  During the short period of time when transitioning from EMS oxygen to our oxygen, she desatted to about 85%.  On 6 L of oxygen by nasal cannula she came back up to the low 90s. Gastrointestinal: Soft and nontender. No distention.  Musculoskeletal: No lower extremity tenderness nor edema. No gross deformities of extremities. Neurologic:  Normal speech and language. No gross focal neurologic deficits are appreciated.  Skin:  Skin is warm, dry and intact. Psychiatric: Mood and affect are normal. Speech and behavior are normal.  ____________________________________________   LABS (all labs ordered are listed, but only abnormal results are displayed)  Labs Reviewed  COMPREHENSIVE METABOLIC PANEL - Abnormal; Notable for the following components:      Result Value   Glucose, Bld 155 (*)    Calcium 8.8 (*)     Albumin 3.0 (*)    GFR, Estimated 58 (*)    All other components within normal limits  BRAIN NATRIURETIC PEPTIDE - Abnormal; Notable for the following components:   B Natriuretic Peptide 207.8 (*)    All other components within normal limits  CBC WITH DIFFERENTIAL/PLATELET - Abnormal; Notable for the following components:   WBC 12.8 (*)  RBC 3.45 (*)    Hemoglobin 10.6 (*)    HCT 32.0 (*)    Neutro Abs 11.3 (*)    All other components within normal limits  PROTIME-INR - Abnormal; Notable for the following components:   Prothrombin Time 16.9 (*)    INR 1.4 (*)    All other components within normal limits  BASIC METABOLIC PANEL - Abnormal; Notable for the following components:   Glucose, Bld 175 (*)    Calcium 8.3 (*)    All other components within normal limits  CBC - Abnormal; Notable for the following components:   WBC 13.8 (*)    RBC 2.96 (*)    Hemoglobin 9.0 (*)    HCT 27.7 (*)    All other components within normal limits  TROPONIN I (HIGH SENSITIVITY) - Abnormal; Notable for the following components:   Troponin I (High Sensitivity) 39 (*)    All other components within normal limits  SARS CORONAVIRUS 2 BY RT PCR (HOSPITAL ORDER, [facility])  CULTURE, BLOOD (ROUTINE X 2)  CULTURE, BLOOD (ROUTINE X 2)  CULTURE, BLOOD (ROUTINE X 2)  CULTURE, BLOOD (ROUTINE X 2)  EXPECTORATED SPUTUM ASSESSMENT W REFEX TO RESP CULTURE  LACTIC ACID, PLASMA  LACTIC ACID, PLASMA  LACTIC ACID, PLASMA  LEGIONELLA PNEUMOPHILA SEROGP 1 UR AG  STREP PNEUMONIAE URINARY ANTIGEN  INFLUENZA PANEL BY PCR (TYPE A & B)  LACTIC ACID, PLASMA  TROPONIN I (HIGH SENSITIVITY)   ____________________________________________  EKG  ED ECG REPORT I, Hinda Kehr, the attending physician, personally viewed and interpreted this ECG.  Date: 11/19/2020 EKG Time: 23:42 Rate: 82 Rhythm: Sinus rhythm QRS Axis: normal Intervals: Supraventricular bigeminy with short PR  interval ST/T Wave abnormalities: Non-specific ST segment / T-wave changes, but no clear evidence of acute ischemia. Narrative Interpretation: no definitive evidence of acute ischemia; does not meet STEMI criteria.   ____________________________________________  RADIOLOGY I, Hinda Kehr, personally viewed and evaluated these images (plain radiographs) as part of my medical decision making, as well as reviewing the written report by the radiologist.  ED MD interpretation: No indication for emergent imaging since the patient just had a CTA chest yesterday.  Official radiology report(s): No results found.  ____________________________________________   PROCEDURES   Procedure(s) performed (including Critical Care):  .1-3 Lead EKG Interpretation Performed by: Hinda Kehr, MD Authorized by: Hinda Kehr, MD     Interpretation: normal     ECG rate:  75   ECG rate assessment: normal     Rhythm: sinus rhythm     Ectopy: none     Conduction: normal   .Critical Care Performed by: Hinda Kehr, MD Authorized by: Hinda Kehr, MD   Critical care provider statement:    Critical care time (minutes):  45   Critical care time was exclusive of:  Separately billable procedures and treating other patients   Critical care was necessary to treat or prevent imminent or life-threatening deterioration of the following conditions:  Sepsis and respiratory failure   Critical care was time spent personally by me on the following activities:  Development of treatment plan with patient or surrogate, discussions with consultants, evaluation of patient's response to treatment, examination of patient, obtaining history from patient or surrogate, ordering and performing treatments and interventions, ordering and review of laboratory studies, ordering and review of radiographic studies, pulse oximetry, re-evaluation of patient's condition and review of old  charts     ____________________________________________   [place] / MDM /  ASSESSMENT AND PLAN / ED COURSE  As part of my medical decision making, I reviewed the following data within the [place] notes reviewed and incorporated, Labs reviewed , EKG interpreted , Old chart reviewed, Discussed with admitting physician (Dr. Sidney Ace)  and Notes from prior ED visits   Differential diagnosis includes, but is not limited to, COVID-19, nonspecific bacterial infection, sepsis, pneumothorax, ACS.  The patient is on the cardiac monitor to evaluate for evidence of arrhythmia and/or significant heart rate changes.     Clinical Course as of 11/20/20 8546  Sat Nov 20, 2020  0101 Consulting hospitalist for admission [CF]  0111 Discussed case by phone with Dr. Sidney Ace with the hospitalist service.  He agreed with the plan for Solu-Medrol, we will hold off on remdesivir until the PCR comes back.  I am holding off on antibiotics as well. [CF]  0132 Upon further consideration of the patient's presentation and discussion with the hospitalist, we agreed that she meets sepsis criteria based on tachypnea and hypoxemia and a mild leukocytosis of 13.8.  Since her COVID-19 test is negative, we will proceed with sepsis protocol including 30 mL/kg of crystalloid and empiric antibiotics.  I ordered meropenem (and vancomycin) per protocol because of a reported allergy to penicillins that include swelling and rash.  Pharmacy may review this and decide to change antibiotics.  Patient still being admitted to the hospitalist service as presumed sepsis. [CF]  0134 SARS Coronavirus 2: NEGATIVE [CF]  0200 sepsis reassessment complete, patient stable [CF]    Clinical Course User Index [CF] Hinda Kehr, MD     ____________________________________________  FINAL CLINICAL IMPRESSION(S) / ED DIAGNOSES  Final diagnoses:  Acute respiratory failure with hypoxemia ([place])  Viral pneumonia   Sepsis, due to unspecified organism, unspecified whether acute organ dysfunction present [facility])     MEDICATIONS GIVEN DURING THIS VISIT:  Medications  amiodarone (PACERONE) tablet 200 mg (has no administration in time range)  doxazosin (CARDURA) tablet 1 mg (has no administration in time range)  metoprolol succinate (TOPROL-XL) 24 hr tablet 50 mg (has no administration in time range)  rosuvastatin (CRESTOR) tablet 40 mg (has no administration in time range)  irbesartan (AVAPRO) tablet 150 mg (has no administration in time range)  temazepam (RESTORIL) capsule 15 mg (has no administration in time range)  furosemide (LASIX) tablet 20 mg (has no administration in time range)    And  furosemide (LASIX) tablet 20 mg (has no administration in time range)  enoxaparin (LOVENOX) injection 40 mg (has no administration in time range)  0.9 %  sodium chloride infusion ( Intravenous New Bag/Given 11/20/20 0619)  acetaminophen (TYLENOL) tablet 650 mg (has no administration in time range)    Or  acetaminophen (TYLENOL) suppository 650 mg (has no administration in time range)  traZODone (DESYREL) tablet 25 mg (has no administration in time range)  magnesium hydroxide (MILK OF MAGNESIA) suspension 30 mL (has no administration in time range)  ondansetron (ZOFRAN) tablet 4 mg (has no administration in time range)    Or  ondansetron (ZOFRAN) injection 4 mg (has no administration in time range)  guaiFENesin (MUCINEX) 12 hr tablet 600 mg (600 mg Oral Given 11/20/20 0447)  ipratropium-albuterol (DUONEB) 0.5-2.5 (3) MG/3ML nebulizer solution 3 mL (has no administration in time range)    And  ipratropium-albuterol (DUONEB) 0.5-2.5 (3) MG/3ML nebulizer solution 3 mL (has no administration in time range)  levofloxacin (LEVAQUIN) IVPB 750 mg (has no administration in time range)  methylPREDNISolone sodium  succinate (SOLU-MEDROL) 125 mg/2 mL injection 125 mg (125 mg Intravenous Given 11/20/20 0153)  lactated ringers  bolus 1,000 mL (0 mLs Intravenous Stopped 11/20/20 0446)    And  lactated ringers bolus 1,000 mL (0 mLs Intravenous Stopped 11/20/20 0613)  meropenem (MERREM) 1 g in sodium chloride 0.9 % 100 mL IVPB (0 g Intravenous Stopped 11/20/20 0247)  vancomycin (VANCOREADY) IVPB 1500 mg/300 mL (0 mg Intravenous Stopped 11/20/20 [id number])     ED Discharge Orders    None      *Please note:  [name] was evaluated in Emergency Department on 11/20/2020 for the symptoms described in the history of present illness. She was evaluated in the context of the global COVID-19 pandemic, which necessitated consideration that the patient might be at risk for infection with the SARS-CoV-2 virus that causes COVID-19. Institutional protocols and algorithms that pertain to the evaluation of patients at risk for COVID-19 are in a state of rapid change based on information released by regulatory bodies including the CDC and federal and state organizations. These policies and algorithms were followed during the patient's care in the ED.  Some ED evaluations and interventions may be delayed as a result of limited staffing during and after the pandemic.*  Note:  This document was prepared using Dragon voice recognition software and may include unintentional dictation errors.   Hinda Kehr, MD 11/20/20 [phone]

## 2020-11-20 NOTE — ED Notes (Signed)
Rn only able to obtain 1 set of blood cultures at this time.

## 2020-11-20 NOTE — Evaluation (Signed)
Clinical/Bedside Swallow Evaluation Patient Details  Name: [name] MRN: [id number] Date of Birth: [birth year]  Today's Date: 11/20/2020 Time: SLP Start Time (ACUTE ONLY): 1315 SLP Stop Time (ACUTE ONLY): 1335 SLP Time Calculation (min) (ACUTE ONLY): 20 min  Past Medical History:  Past Medical History:  Diagnosis Date  . Anxiety   . Bronchitis   . Chronic combined systolic (congestive) and diastolic (congestive) heart failure ([place])    a. 02/2018 Echo: EF 45-50%, diff HK, triv AI, mod MR, mildly dil LA/RA, nl RV fxn, mod TR. PASP 40-70mHg.  .Marland KitchenChronic kidney disease   . DJD (degenerative joint disease), cervical   . History of stress test    a. 01/2008 MV: EF 76%. Fair ex tol. No ischemia.  . Hyperlipidemia   . Hypertension   . Hypothyroid   . LBBB (left bundle branch block)   . Leg pain    a. 01/2018 ABI's wnl.  . Lumbar spondylosis   . Persistent [place] fibrillation [place]    a. 01/2018 Event monitor: 45 runs of SVT, longest 14.5 sec. SVT felt to be Afib/flutter-->2% burden. Longest run of AF 4h 2100mCHA2DS2VASc = 5-->Xarelto); b. 03/2019 s/p DCCV; c. 04/2019 Repeat Amio load and DCCV.  . Marland KitchenVT (supraventricular tachycardia) ([facility])    a. AVNRT - s/p ablation by Dr TaLovena Le/13   Past Surgical History:  Past Surgical History:  Procedure Laterality Date  . BACK SURGERY  07/2019  . CARDIOVERSION N/A 03/26/2018   Procedure: CARDIOVERSION;  Surgeon: EnNelva BushMD;  Location: [facility] ORS;  Service: Cardiovascular;  Laterality: N/A;  . CARDIOVERSION N/A 04/24/2018   Procedure: CARDIOVERSION;  Surgeon: GoMinna MerrittsMD;  Location: [facility] ORS;  Service: Cardiovascular;  Laterality: N/A;  . ELECTROPHYSIOLOGY STUDY N/A 02/27/2012   Procedure: ELECTROPHYSIOLOGY STUDY;  Surgeon: GrEvans LanceMD;  [facility];  Service: Cardiovascular;  Laterality: N/A;  . EPS and ablation for SVT  5/13   slow pathway ablation by Dr TaLovena Le. EYE SURGERY     surgery for detatched retina  .  NECK SURGERY  02/2020  . SUPRAVENTRICULAR TACHYCARDIA ABLATION N/A 02/27/2012   Procedure: SUPRAVENTRICULAR TACHYCARDIA ABLATION;  Surgeon: GrEvans LanceMD;  [facility];  Service: Cardiovascular;  Laterality: N/A;   HPI:  Patient is a [age].o. female with PMHx of anxiety, bronchitis, CHF, CKD, cervical degenerative joint disease, HTN, lumbar spondylosis, and Afib admitted to ED d/t worsening dyspnea and respiratory distress. Patient recently diagnosed with PNA ~3 weeks ago and was hospitalized at that time as well. Current dx: multi-focal PNA. Upon admission, patient's placed on 14L of O2 via [place]/non-rebreather, which has decreaed to 6-7L via [place]. Per patient report, patient with back/neck surgery last May, which was described as ACDF surgery. Since surgery, she stated that she has had "bouts of swallowing difficulty" and "feeling food stuck in her throat". She is currently consuming a regular/thin liquid diet. SLP to complete BSE today.   Assessment / Plan / Recommendation Clinical Impression  Patient presents with no oral dysphagia, however suspected pharyngeal dysphagia c/b consistent reported globus sensation in pharyngeal/laryngeal area following bites of solid consistencies. D/t repeated bouts of PNA, decreased respiratory status, and what appears to be recent ACDF surgery last May (suspected from patient report, however unable to confirm in MD/RN notes), patient at increase risk of pharyngeal dysphagia, potentially exacerbating her current respiratory status and/or contributing to her multi-focal PNA. SLP recommendes patient to undergo a Modified Barium Swallow  Study (MBSS) to r/o pharyngeal dysphagia and determine potential cause(s) of recent PNAs. Patient verbalized understanding and agreed to [facility] in next 2-3 days. If patient d/c's prior to Monday and/or ability to receive MBSS during this hospital stay, SLP recommends patient to receive MBSS in OP setting. In the meantime, patient to  continue with current regular/thin liquid diet and notify MD/RN if changes in swallowing function occur during her hospital stay. MD notified of the above recommendations as well. Patient left upright in bed with call bell in reach and all wants/needs met.   SLP Visit Diagnosis: Dysphagia, unspecified (R13.10)    Aspiration Risk  Moderate aspiration risk    Diet Recommendation Regular;Thin liquid   Liquid Administration via: Straw Medication Administration: Other (Comment) (Per MD/RN discretion) Supervision: Patient able to self feed    Other  Recommendations Oral Care Recommendations: Patient independent with oral care   Follow up Recommendations Other (comment) (TBD)      Frequency and Duration min 3x week  2 weeks       Prognosis Prognosis for Safe Diet Advancement: Good      Swallow Study   General Date of Onset: 11/19/20 HPI: Patient is a 80 y.o. female with PMHx of anxiety, bronchitis, CHF, CKD, cervical degenerative joint disease, HTN, lumbar spondylosis, and Afib admitted to ED d/t worsening dyspnea and respiratory distress. Patient recently diagnosed with PNA ~3 weeks ago and was hospitalized at that time as well. Current dx: multi-focal PNA. Upon admission, patient's placed on 14L of O2 via [place]/non-rebreather, which has decreaed to 6-7L via [place]. Per patient report, patient with back/neck surgery last May, which was described as ACDF surgery. Since surgery, she stated that she has had "bouts of swallowing difficulty" and "feeling food stuck in her throat". She is currently consuming a regular/thin liquid diet. SLP to complete BSE today. Type of Study: Bedside Swallow Evaluation Previous Swallow Assessment: None reported. Diet Prior to this Study: Thin liquids;Regular Respiratory Status: Nasal cannula (6-7L O2 via [place]) History of Recent Intubation: No Behavior/Cognition: Alert;Cooperative;Pleasant mood Oral Cavity Assessment: Within Functional Limits Oral Cavity -  Dentition: Missing dentition Vision: Functional for self-feeding Self-Feeding Abilities: Able to feed self Patient Positioning: Upright in bed Baseline Vocal Quality: Normal Volitional Cough: Strong Volitional Swallow:  (laryngeal movement present)    Oral/Motor/Sensory Function Overall Oral Motor/Sensory Function: Within functional limits   Ice Chips Ice chips: Within functional limits   Thin Liquid Thin Liquid: Within functional limits    Nectar Thick Nectar Thick Liquid: Not tested   Honey Thick Honey Thick Liquid: Not tested   Puree Puree: Not tested   Solid     Solid: Impaired Pharyngeal Phase Impairments: Other (comments) Other Comments: Reported globus sensation in laryngeal area following bites of cracker     Loni Beckwith, M.S. [place] Speech-Language Pathologist  Loni Beckwith 11/20/2020,2:12 PM

## 2020-11-20 NOTE — ED Notes (Addendum)
Pt remains on 6 liter's [place] and purewick. Report taken from [place], [place].

## 2020-11-20 NOTE — ED Notes (Signed)
Pt denies n/v/d. Does affirm some increased weakness. Pt states currently her breathing is doing "much better" than on arrival. Pt resting comfortably at this time. Given cup of water and warm blanket per request.

## 2020-11-21 DIAGNOSIS — D72829 Elevated white blood cell count, unspecified: Secondary | ICD-10-CM

## 2020-11-21 DIAGNOSIS — I5022 Chronic systolic (congestive) heart failure: Secondary | ICD-10-CM

## 2020-11-21 LAB — EXPECTORATED SPUTUM ASSESSMENT W GRAM STAIN, RFLX TO RESP C

## 2020-11-21 LAB — CBC WITH DIFFERENTIAL/PLATELET
Abs Immature Granulocytes: 0.12 10*3/uL — ABNORMAL HIGH (ref 0.00–0.07)
Basophils Absolute: 0 10*3/uL (ref 0.0–0.1)
Basophils Relative: 0 %
Eosinophils Absolute: 0 10*3/uL (ref 0.0–0.5)
Eosinophils Relative: 0 %
HCT: 27.5 % — ABNORMAL LOW (ref 36.0–46.0)
Hemoglobin: 9.4 g/dL — ABNORMAL LOW (ref 12.0–15.0)
Immature Granulocytes: 1 %
Lymphocytes Relative: 6 %
Lymphs Abs: 0.8 10*3/uL (ref 0.7–4.0)
MCH: 31 pg (ref 26.0–34.0)
MCHC: 34.2 g/dL (ref 30.0–36.0)
MCV: 90.8 fL (ref 80.0–100.0)
Monocytes Absolute: 0.5 10*3/uL (ref 0.1–1.0)
Monocytes Relative: 4 %
Neutro Abs: 12.4 10*3/uL — ABNORMAL HIGH (ref 1.7–7.7)
Neutrophils Relative %: 89 %
Platelets: 243 10*3/uL (ref 150–400)
RBC: 3.03 MIL/uL — ABNORMAL LOW (ref 3.87–5.11)
RDW: 14.1 % (ref 11.5–15.5)
WBC: 13.8 10*3/uL — ABNORMAL HIGH (ref 4.0–10.5)
nRBC: 0 % (ref 0.0–0.2)

## 2020-11-21 LAB — COMPREHENSIVE METABOLIC PANEL
ALT: 21 U/L (ref 0–44)
AST: 26 U/L (ref 15–41)
Albumin: 2.6 g/dL — ABNORMAL LOW (ref 3.5–5.0)
Alkaline Phosphatase: 64 U/L (ref 38–126)
Anion gap: 10 (ref 5–15)
BUN: 21 mg/dL (ref 8–23)
CO2: 25 mmol/L (ref 22–32)
Calcium: 8.7 mg/dL — ABNORMAL LOW (ref 8.9–10.3)
Chloride: 107 mmol/L (ref 98–111)
Creatinine, Ser: 0.83 mg/dL (ref 0.44–1.00)
GFR, Estimated: >60 mL/min (ref >60)
Glucose, Bld: 169 mg/dL — ABNORMAL HIGH (ref 70–99)
Potassium: 3.3 mmol/L — ABNORMAL LOW (ref 3.5–5.1)
Sodium: 142 mmol/L (ref 135–145)
Total Bilirubin: 0.5 mg/dL (ref 0.3–1.2)
Total Protein: 6.1 g/dL — ABNORMAL LOW (ref 6.5–8.1)

## 2020-11-21 LAB — MAGNESIUM: Magnesium: 2.3 mg/dL (ref 1.7–2.4)

## 2020-11-21 MED ORDER — METHYLPREDNISOLONE SODIUM SUCC 40 MG IJ SOLR
40.0000 mg | Freq: Two times a day (BID) | INTRAMUSCULAR | Status: DC
  Administered 2020-11-21 – 2020-11-22 (×2): 40 mg via INTRAVENOUS
  Filled 2020-11-21 (×2): qty 1

## 2020-11-21 MED ORDER — POTASSIUM CHLORIDE CRYS ER 20 MEQ PO TBCR
40.0000 meq | EXTENDED_RELEASE_TABLET | ORAL | Status: AC
Start: 2020-11-21 — End: 2020-11-21
  Administered 2020-11-21 (×2): 40 meq via ORAL
  Filled 2020-11-21 (×2): qty 2

## 2020-11-21 NOTE — ED Notes (Signed)
Pt noted to be wet. Pt cleaned. New chux placed. New purewick placed. RN and female nurse tech helped pt

## 2020-11-21 NOTE — Evaluation (Signed)
Physical Therapy Evaluation Patient Details Name: [name] MRN: [id number] DOB: 05-Apr-1941 Today's Date: 11/21/2020   History of Present Illness  Pt is a 80 y.o. female presenting to hospital 1/29 with SOB.  Pt admitted with bacterial multifocal PNA, sepsis d/t PNA, and hypothyroidism  PMH includes anxiety, chronic combined systolic and diastolic congestive heart failure, CKD, htn, LBBB, leg pain, persistant a-fib, SVT, mitral valve insufficiency, pulmonary htn, PAD, h/o back sx, h/o cardioversion, h/o neck surgery, h/o SVT ablation.  Clinical Impression  Prior to hospital admission, pt was independent; no home O2; and lives alone in 1 level home with 2 STE L railing.  Purewick noted to not be working beginning of session so pt's gown and sheets were wet; therapist assisted pt with changing gown and therapist changed pt's sheets; pt able to perform hygiene to clean up in standing with SBA and set-up.  Currently pt is modified independent with bed mobility; CGA with transfers; and CGA ambulating 5 feet and then 8 feet (no AD); limited distance ambulating d/t reports of moderate SOB requiring sitting rest breaks to recover (O2 sats 89% or greater on 3 L O2 via nasal cannula).  Pt would benefit from skilled PT to address noted impairments and functional limitations (see below for any additional details).  Upon hospital discharge, pt would benefit from [place].    Follow Up Recommendations Home health PT    Equipment Recommendations  Rolling walker with 5" wheels (pt has RW at home already)    Recommendations for Other Services       Precautions / Restrictions Precautions Precautions: Fall Restrictions Weight Bearing Restrictions: No      Mobility  Bed Mobility Overal bed mobility: Modified Independent             General bed mobility comments: Semi-supine to/from sitting with increased time/effort to perform on own    Transfers Overall transfer level: Needs  assistance Equipment used: None Transfers: Sit to/from Stand Sit to Stand: Min guard         General transfer comment: increased effort to stand x3 trials; L UE support on bed rail  Ambulation/Gait Ambulation/Gait assistance: Min guard Gait Distance (Feet):  (x5 feet; x8 feet) Assistive device: None   Gait velocity: decreased   General Gait Details: decreased B LE step length/foot clearance; appearing cautious  Stairs            Wheelchair Mobility    Modified Rankin (Stroke Patients Only)       Balance Overall balance assessment: Needs assistance Sitting-balance support: No upper extremity supported;Feet supported Sitting balance-Leahy Scale: Normal Sitting balance - Comments: steady sitting reaching outside BOS   Standing balance support: No upper extremity supported Standing balance-Leahy Scale: Good Standing balance comment: steady standing performing hygiene (d/t purewick leaking)                             Pertinent Vitals/Pain Pain Assessment: No/denies pain Pain Intervention(s): Limited activity within patient's tolerance;Monitored during session;Repositioned  HR WFL during sessions activities.    Home Living Family/patient expects to be discharged to:: Private residence Living Arrangements: Alone Available Help at Discharge: Friend(s);Available PRN/intermittently Type of Home: House Home Access: Stairs to enter Entrance Stairs-Rails: Left Entrance Stairs-Number of Steps: 2 Home Layout: Able to live on main level with bedroom/bathroom (does not need to go to basement) Home Equipment: Shower seat;Walker - 2 wheels;Kasandra Knudsen - single point      Prior Function  Level of Independence: Independent         Comments: No home O2; no recent falls     Hand Dominance        Extremity/Trunk Assessment   Upper Extremity Assessment Upper Extremity Assessment: Generalized weakness    Lower Extremity Assessment Lower Extremity  Assessment: Generalized weakness    Cervical / Trunk Assessment Cervical / Trunk Assessment: Normal  Communication   Communication: No difficulties  Cognition Arousal/Alertness: Awake/alert Behavior During Therapy: WFL for tasks assessed/performed Overall Cognitive Status: Within Functional Limits for tasks assessed                                        General Comments   Nursing cleared pt for participation in physical therapy.  Pt agreeable to PT session.  Pt's boyfriend left beginning of session.    Exercises     Assessment/Plan    PT Assessment Patient needs continued PT services  PT Problem List Decreased strength;Decreased activity tolerance;Decreased balance;Decreased mobility;Decreased knowledge of use of DME;Cardiopulmonary status limiting activity       PT Treatment Interventions DME instruction;Gait training;Stair training;Functional mobility training;Therapeutic activities;Therapeutic exercise;Balance training;Patient/family education    PT Goals (Current goals can be found in the Care Plan section)  Acute Rehab PT Goals Patient Stated Goal: to go home PT Goal Formulation: With patient Time For Goal Achievement: 12/05/20 Potential to Achieve Goals: Good    Frequency Min 2X/week   Barriers to discharge        Co-evaluation               AM-PAC PT "6 Clicks" Mobility  Outcome Measure Help needed turning from your back to your side while in a flat bed without using bedrails?: None Help needed moving from lying on your back to sitting on the side of a flat bed without using bedrails?: None Help needed moving to and from a bed to a chair (including a wheelchair)?: A Little Help needed standing up from a chair using your arms (e.g., wheelchair or bedside chair)?: A Little Help needed to walk in hospital room?: A Little Help needed climbing 3-5 steps with a railing? : A Little 6 Click Score: 20    End of Session Equipment Utilized  During Treatment: Gait belt;Oxygen (3 L O2 via nasal cannula) Activity Tolerance: Other (comment) (Limited d/t SOB with activity) Patient left: in bed;with call bell/phone within reach;with bed alarm set Nurse Communication: Mobility status;Precautions PT Visit Diagnosis: Other abnormalities of gait and mobility (R26.89);Muscle weakness (generalized) (M62.81);Difficulty in walking, not elsewhere classified (R26.2)    Time: 1610-9604 PT Time Calculation (min) (ACUTE ONLY): 31 min   Charges:   PT Evaluation $PT Eval Low Complexity: 1 Low PT Treatments $Therapeutic Activity: 8-22 mins       Leitha Bleak, PT 11/21/20, 2:56 PM

## 2020-11-21 NOTE — ED Notes (Signed)
New female purewick placed and pericare provided.

## 2020-11-21 NOTE — ED Notes (Signed)
Dinner tray given

## 2020-11-21 NOTE — ED Notes (Addendum)
Pt has maintained O2 sat >95% on 4L [place], decreased to 3L [place] will continue to monitor the pt for tolerance. Pt family/friend is at the bedside

## 2020-11-21 NOTE — ED Notes (Signed)
Pt maintained at 98% on 3L [place], pt decreased to 2L , will continue to monitor the pt,.

## 2020-11-21 NOTE — Progress Notes (Signed)
Patient ID: [name], female   DOB: 1940-10-24, 80 y.o.   MRN: [id number]  PROGRESS NOTE    LANDRIE BOTTONE  [id number] DOB: [birth year] DOA: 11/19/2020 PCP: Steele Sizer, MD   Brief Narrative:  80 year old female with history of hypertension, hyperlipidemia, hypothyroidism, paroxysmal atrial fibrillation, chronic systolic heart failure, recent admission and discharge on 11/02/2020 for pneumonia anxiety presented with worsening shortness of breath.  On presentation, she was hypoxic requiring supplemental oxygen with leukocytosis.  COVID-19 PCR was negative.  Chest x-ray showed multifocal pneumonia.  She was started on IV fluids and antibiotics.  Assessment & Plan:   Sepsis: Present on admission Multifocal bacterial pneumonia -Patient presented with worsening shortness of breath and was hypoxic, tachypneic requiring supplemental oxygen and had leukocytosis and multifocal pneumonia on presentation -Treated with IV fluids and antibiotics.  Subsequently IV fluids have been discontinued.  Continue Levaquin.  COVID-19 and flu tests was negative. -CTA chest was negative for PE but showed multifocal pneumonia.  Patient was also started on IV steroids.  Decrease Solu-Medrol to 40 mg IV every 12 hours. -Currently on 6 L oxygen via nasal cannula.  Wean off as able.  Does not wear oxygen at home. -Diet as per his recommendations  -recommend outpatient pulmonary evaluation and follow-up  Leukocytosis -Monitor  Hypokalemia -Replace.  Repeat a.m. labs  Anemia of chronic disease -Questionable cause.  Hemoglobin stable.  Hypothyroidism -Continue Synthroid  Dyslipidemia -Continue statin  Essential hypertension -Continue Diovan and Toprol-XL  Chronic systolic heart failure -Currently compensated.  Continue Diovan, Toprol-XL and Lasix.  Strict input and output.  Daily weights.  Fluid restriction.  Outpatient follow-up with cardiology  Paroxysmal A. Fib -Continue Toprol-XL and  Eliquis  Generalized conditioning -PT eval   DVT prophylaxis: Eliquis Code Status: Full Family Communication: Spoke to daughter on phone on 11/21/2020 Disposition Plan: Status is: Inpatient  Remains inpatient appropriate because:Inpatient level of care appropriate due to severity of illness   Dispo: The patient is from: Home              Anticipated d/c is to: Home              Anticipated d/c date is: 3 days              Patient currently is not medically stable to d/c.   Difficult to place patient No   Consultants: None  Procedures: None  Antimicrobials:  Anti-infectives (From admission, onward)   Start     Dose/Rate Route Frequency Ordered Stop   11/21/20 0630  levofloxacin (LEVAQUIN) IVPB 750 mg        750 mg 100 mL/hr over 90 Minutes Intravenous Every 24 hours 11/20/20 0626 11/25/20 0629   11/20/20 0500  levofloxacin (LEVAQUIN) IVPB 750 mg  Status:  Discontinued        750 mg 100 mL/hr over 90 Minutes Intravenous Every 48 hours 11/20/20 0305 11/20/20 0625   11/20/20 0430  levofloxacin (LEVAQUIN) IVPB 750 mg  Status:  Discontinued        750 mg 100 mL/hr over 90 Minutes Intravenous  Once 11/20/20 0422 11/20/20 0436   11/20/20 0145  vancomycin (VANCOCIN) IVPB 1000 mg/200 mL premix  Status:  Discontinued        1,000 mg 200 mL/hr over 60 Minutes Intravenous  Once 11/20/20 0132 11/20/20 0137   11/20/20 0145  meropenem (MERREM) 1 g in sodium chloride 0.9 % 100 mL IVPB  Status:  Discontinued  1 g 200 mL/hr over 30 Minutes Intravenous  Once 11/20/20 0132 11/20/20 0136   11/20/20 0145  meropenem (MERREM) 1 g in sodium chloride 0.9 % 100 mL IVPB        1 g 200 mL/hr over 30 Minutes Intravenous  Once 11/20/20 0136 11/20/20 0247   11/20/20 0145  vancomycin (VANCOREADY) IVPB 1500 mg/300 mL        1,500 mg 150 mL/hr over 120 Minutes Intravenous  Once 11/20/20 0138 11/20/20 [id number]        Subjective: Patient seen and examined at bedside.  Feels slightly better.   Shortness of breath is improving.  No overnight fever or vomiting reported.  Oral intake is still poor.  Objective: Vitals:   11/21/20 0400 11/21/20 0600 11/21/20 0700 11/21/20 0800  BP: (!) 109/42 (!) 128/49  (!) 140/52  Pulse: (!) 54 60 62 66  Resp: 17 18 (!) 23 (!) 21  Temp:      TempSrc:      SpO2: 97% 98% 98% 100%  Weight:      Height:        Intake/Output Summary (Last 24 hours) at 11/21/2020 0830 Last data filed at 11/20/2020 1414 Gross per 24 hour  Intake 300 ml  Output 1800 ml  Net -1500 ml   Filed Weights   11/19/20 2332  Weight: 60.8 kg    Examination:  General exam: Appears calm and comfortable.  Currently on 6 L oxygen via nasal cannula Respiratory system: Bilateral decreased breath sounds at bases with some scattered crackles, tachypneic  cardiovascular system: S1 & S2 heard, Rate controlled Gastrointestinal system: Abdomen is nondistended, soft and nontender. Normal bowel sounds heard. Extremities: No cyanosis, clubbing; trace lower extremity edema Central nervous system: Alert and oriented. No focal neurological deficits. Moving extremities Skin: No rashes, lesions or ulcers Psychiatry: Flat affect    Data Reviewed: I have personally reviewed following labs and imaging studies  CBC: Recent Labs  Lab 11/18/20 0957 11/19/20 2338 11/20/20 0450 11/21/20 0535  WBC 10.5 12.8* 13.8* 13.8*  NEUTROABS 8.7* 11.3*  --  12.4*  HGB 10.5* 10.6* 9.0* 9.4*  HCT 32.1* 32.0* 27.7* 27.5*  MCV 93.9 92.8 93.6 90.8  PLT 255 260 234 0000000   Basic Metabolic Panel: Recent Labs  Lab 11/18/20 0957 11/19/20 2338 11/20/20 0450 11/21/20 0535  NA 138 137 137 142  K 3.5 3.6 3.8 3.3*  CL 103 101 104 107  CO2 23 25 22 25   GLUCOSE 177* 155* 175* 169*  BUN 18 23 21 21   CREATININE 1.06* 1.00 0.83 0.83  CALCIUM 8.8* 8.8* 8.3* 8.7*  MG  --   --   --  2.3   GFR: Estimated Creatinine Clearance: 52.3 mL/min (by C-G formula based on SCr of 0.83 mg/dL). Liver Function  Tests: Recent Labs  Lab 11/18/20 0957 11/19/20 2338 11/21/20 0535  AST 25 38 26  ALT 18 29 21   ALKPHOS 80 86 64  BILITOT 0.6 0.7 0.5  PROT 7.4 7.5 6.1*  ALBUMIN 3.3* 3.0* 2.6*   No results for input(s): LIPASE, AMYLASE in the last 168 hours. No results for input(s): AMMONIA in the last 168 hours. Coagulation Profile: Recent Labs  Lab 11/19/20 2338  INR 1.4*   Cardiac Enzymes: No results for input(s): CKTOTAL, CKMB, CKMBINDEX, TROPONINI in the last 168 hours. BNP (last 3 results) No results for input(s): PROBNP in the last 8760 hours. HbA1C: No results for input(s): HGBA1C in the last 72 hours. CBG: No results  for input(s): GLUCAP in the last 168 hours. Lipid Profile: No results for input(s): CHOL, HDL, LDLCALC, TRIG, CHOLHDL, LDLDIRECT in the last 72 hours. Thyroid Function Tests: No results for input(s): TSH, T4TOTAL, FREET4, T3FREE, THYROIDAB in the last 72 hours. Anemia Panel: No results for input(s): VITAMINB12, FOLATE, FERRITIN, TIBC, IRON, RETICCTPCT in the last 72 hours. Sepsis Labs: Recent Labs  Lab 11/18/20 0957 11/19/20 2338 11/20/20 0236 11/20/20 0450  PROCALCITON <0.10  --   --   --   LATICACIDVEN  --  1.3 1.4 0.9    Recent Results (from the past 240 hour(s))  Novel Coronavirus, NAA ([facility])     Status: None   Collection Time: 11/18/20  5:59 PM   Specimen: Nasopharyngeal(NP) swabs in vial transport medium   Nasopharynge  Screenin  Result Value Ref Range Status   SARS-CoV-2, NAA Not Detected Not Detected Final    Comment: This nucleic acid amplification test was developed and its performance characteristics determined by [employer]. Nucleic acid amplification tests include RT-PCR and TMA. This test has not been FDA cleared or approved. This test has been authorized by FDA under an Emergency Use Authorization (EUA). This test is only authorized for the duration of time the declaration that circumstances exist justifying the authorization  of the emergency use of in vitro diagnostic tests for detection of SARS-CoV-2 virus and/or diagnosis of COVID-19 infection under section 564(b)(1) of the Act, 21 U.S.C. 893YBO-1(B) (1), unless the authorization is terminated or revoked sooner. When diagnostic testing is negative, the possibility of a false negative result should be considered in the context of a patient's recent exposures and the presence of clinical signs and symptoms consistent with COVID-19. An individual without symptoms of COVID-19 and who is not shedding SARS-CoV-2 virus wo uld expect to have a negative (not detected) result in this assay.   SARS-COV-2, NAA 2 DAY TAT     Status: None   Collection Time: 11/18/20  5:59 PM   Nasopharynge  Screenin  Result Value Ref Range Status   SARS-CoV-2, NAA 2 DAY TAT Performed  Final  SARS Coronavirus 2 by RT PCR (hospital order, performed in [place] hospital lab) Nasopharyngeal Nasopharyngeal Swab     Status: None   Collection Time: 11/19/20 11:38 PM   Specimen: Nasopharyngeal Swab  Result Value Ref Range Status   SARS Coronavirus 2 NEGATIVE NEGATIVE Final    Comment: (NOTE) SARS-CoV-2 target nucleic acids are NOT DETECTED.  The SARS-CoV-2 RNA is generally detectable in upper and lower respiratory specimens during the acute phase of infection. The lowest concentration of SARS-CoV-2 viral copies this assay can detect is 250 copies / mL. A negative result does not preclude SARS-CoV-2 infection and should not be used as the sole basis for treatment or other patient management decisions.  A negative result may occur with improper specimen collection / handling, submission of specimen other than nasopharyngeal swab, presence of viral mutation(s) within the areas targeted by this assay, and inadequate number of viral copies (<250 copies / mL). A negative result must be combined with clinical observations, patient history, and epidemiological information.  Fact Sheet for  Patients:   StrictlyIdeas.no  Fact Sheet for Healthcare Providers: BankingDealers.co.za  This test is not yet approved or  cleared by the [place] FDA and has been authorized for detection and/or diagnosis of SARS-CoV-2 by FDA under an Emergency Use Authorization (EUA).  This EUA will remain in effect (meaning this test can be used) for the duration of the  COVID-19 declaration under Section 564(b)(1) of the Act, 21 U.S.C. section 360bbb-3(b)(1), unless the authorization is terminated or revoked sooner.  Performed at [facility], [address] [place], [place] [address]   Blood culture (routine x 2)     Status: None (Preliminary result)   Collection Time: 11/20/20  1:33 AM   Specimen: Right Antecubital; Blood  Result Value Ref Range Status   Specimen Description RIGHT ANTECUBITAL  Final   Special Requests   Final    BOTTLES DRAWN AEROBIC AND ANAEROBIC Blood Culture adequate volume   Culture   Final    NO GROWTH 1 DAY Performed at [facility] [address] [place], [place] [address]    Report Status PENDING  Incomplete  Culture, blood (routine x 2) Call MD if unable to obtain prior to antibiotics being given     Status: None (Preliminary result)   Collection Time: 11/20/20  7:52 AM   Specimen: BLOOD  Result Value Ref Range Status   Specimen Description BLOOD BLOOD RIGHT HAND  Final   Special Requests   Final    BOTTLES DRAWN AEROBIC AND ANAEROBIC Blood Culture results may not be optimal due to an inadequate volume of blood received in culture bottles   Culture   Final    NO GROWTH 1 DAY Performed at [facility], [address] [place], [place] [address]    Report Status PENDING  Incomplete  Culture, blood (routine x 2) Call MD if unable to obtain prior to antibiotics being given     Status: None (Preliminary result)   Collection Time: 11/20/20  7:52 AM   Specimen: BLOOD  Result Value  Ref Range Status   Specimen Description BLOOD BLOOD LEFT HAND  Final   Special Requests   Final    BOTTLES DRAWN AEROBIC AND ANAEROBIC Blood Culture results may not be optimal due to an inadequate volume of blood received in culture bottles   Culture   Final    NO GROWTH 1 DAY Performed at [facility], [address] [place], [place] [address]    Report Status PENDING  Incomplete  Resp Panel by RT-PCR (Flu A&B, Covid)     Status: None   Collection Time: 11/20/20 12:18 PM  Result Value Ref Range Status   SARS Coronavirus 2 by RT PCR NEGATIVE NEGATIVE Final    Comment: (NOTE) SARS-CoV-2 target nucleic acids are NOT DETECTED.  The SARS-CoV-2 RNA is generally detectable in upper respiratory specimens during the acute phase of infection. The lowest concentration of SARS-CoV-2 viral copies this assay can detect is 138 copies/mL. A negative result does not preclude SARS-Cov-2 infection and should not be used as the sole basis for treatment or other patient management decisions. A negative result may occur with  improper specimen collection/handling, submission of specimen other than nasopharyngeal swab, presence of viral mutation(s) within the areas targeted by this assay, and inadequate number of viral copies(<138 copies/mL). A negative result must be combined with clinical observations, patient history, and epidemiological information. The expected result is Negative.  Fact Sheet for Patients:  EntrepreneurPulse.com.au  Fact Sheet for Healthcare Providers:  IncredibleEmployment.be  This test is no t yet approved or cleared by the [place] FDA and  has been authorized for detection and/or diagnosis of SARS-CoV-2 by FDA under an Emergency Use Authorization (EUA). This EUA will remain  in effect (meaning this test can be used) for the duration of the COVID-19 declaration under Section 564(b)(1) of the  Act, 21 U.S.C.section  360bbb-3(b)(1), unless the authorization is terminated  or revoked sooner.       Influenza A by PCR NEGATIVE NEGATIVE Final   Influenza B by PCR NEGATIVE NEGATIVE Final    Comment: (NOTE) The Xpert Xpress SARS-CoV-2/FLU/RSV plus assay is intended as an aid in the diagnosis of influenza from Nasopharyngeal swab specimens and should not be used as a sole basis for treatment. Nasal washings and aspirates are unacceptable for Xpert Xpress SARS-CoV-2/FLU/RSV testing.  Fact Sheet for Patients: EntrepreneurPulse.com.au  Fact Sheet for Healthcare Providers: IncredibleEmployment.be  This test is not yet approved or cleared by the [place] FDA and has been authorized for detection and/or diagnosis of SARS-CoV-2 by FDA under an Emergency Use Authorization (EUA). This EUA will remain in effect (meaning this test can be used) for the duration of the COVID-19 declaration under Section 564(b)(1) of the Act, 21 U.S.C. section 360bbb-3(b)(1), unless the authorization is terminated or revoked.  Performed at [facility], [address], [place], [place] [address]          Radiology Studies: No results found.      Scheduled Meds: . amiodarone  200 mg Oral Daily  . apixaban  5 mg Oral BID  . doxazosin  1 mg Oral QPM  . furosemide  20 mg Oral q AM  . guaiFENesin  600 mg Oral BID  . ipratropium-albuterol  3 mL Nebulization QID  . irbesartan  150 mg Oral Daily  . levothyroxine  137 mcg Oral Q0600  . methylPREDNISolone (SOLU-MEDROL) injection  40 mg Intravenous Q8H  . metoprolol succinate  50 mg Oral Q breakfast  . potassium chloride  40 mEq Oral Q4H  . rosuvastatin  40 mg Oral Daily   Continuous Infusions: . levofloxacin (LEVAQUIN) IV Stopped (11/21/20 0741)          Aline August, MD [facility] 11/21/2020, 8:30 AM

## 2020-11-21 NOTE — ED Notes (Signed)
Pt is 100% on 6L [place], pt decreased to 4L [place], will continue to monitor the pt.

## 2020-11-21 NOTE — Consult Note (Signed)
Pharmacy Antibiotic Note  [name] is a 80 y.o. female admitted on 11/19/2020 with pneumonia.  Pharmacy has been consulted for Levaquin dosing.  Plan: Levaquin 750mg  Q24 hours  Height: 5\' 6"  (167.6 cm) Weight: 60.8 kg (134 lb) IBW/kg (Calculated) : 59.3  No data recorded.  Recent Labs  Lab 11/18/20 0957 11/19/20 2338 11/20/20 0236 11/20/20 0450 11/21/20 0535  WBC 10.5 12.8*  --  13.8* 13.8*  CREATININE 1.06* 1.00  --  0.83 0.83  LATICACIDVEN  --  1.3 1.4 0.9  --     Estimated Creatinine Clearance: 52.3 mL/min (by C-G formula based on SCr of 0.83 mg/dL).    Allergies  Allergen Reactions  . Chocolate Swelling  . Penicillins Swelling and Rash    Has patient had a PCN reaction causing immediate rash, facial/tongue/throat swelling, SOB or lightheadedness with hypotension: Yes Has patient had a PCN reaction causing severe rash involving mucus membranes or skin necrosis: No Has patient had a PCN reaction that required hospitalization: No Has patient had a PCN reaction occurring within the last 10 years: No If all of the above answers are "NO", then may proceed with Cephalosporin use.   . Codeine Nausea And Vomiting  . Erythromycin Itching and Swelling  . Septra [Sulfamethoxazole-Trimethoprim] Swelling    Antimicrobials this admission: Vanc/Mero 1/29 x1   Microbiology results: 1/29 BCx: NGTD  Thank you for allowing pharmacy to be a part of this patient's care.  Valentine Kuechle A Savien Mamula 11/21/2020 10:50 AM

## 2020-11-21 NOTE — ED Notes (Signed)
Pt asleep in bed at this time. VSS 

## 2020-11-21 NOTE — ED Notes (Signed)
Pt states coming in with shortness of breath. Pt states she has no new pain and is feeling better. Pt states they only time she has shortness of breath is when she is talking or moving. Pt in bed, watching TV at this time

## 2020-11-22 ENCOUNTER — Inpatient Hospital Stay: Payer: Medicare Other

## 2020-11-22 ENCOUNTER — Other Ambulatory Visit: Payer: Self-pay | Admitting: Family Medicine

## 2020-11-22 DIAGNOSIS — E039 Hypothyroidism, unspecified: Secondary | ICD-10-CM

## 2020-11-22 MED ORDER — METHYLPREDNISOLONE SODIUM SUCC 40 MG IJ SOLR
40.0000 mg | INTRAMUSCULAR | Status: DC
  Administered 2020-11-23 – 2020-11-24 (×2): 40 mg via INTRAVENOUS
  Filled 2020-11-22 (×2): qty 1

## 2020-11-22 NOTE — Progress Notes (Signed)
Objective Swallowing Evaluation: Type of Study: MBS-Modified Barium Swallow Study   Patient Details  Name: [name] MRN: [id number] Date of Birth: 1941-02-06  Today's Date: 11/22/2020 Time: SLP Start Time (ACUTE ONLY): 1242 -SLP Stop Time (ACUTE ONLY): 0258  SLP Time Calculation (min) (ACUTE ONLY): 23 min   Past Medical History:  Past Medical History:  Diagnosis Date   Anxiety    Bronchitis    Chronic combined systolic (congestive) and diastolic (congestive) heart failure ([place])    a. 02/2018 Echo: EF 45-50%, diff HK, triv AI, mod MR, mildly dil LA/RA, nl RV fxn, mod TR. PASP 40-64mmHg.   Chronic kidney disease    DJD (degenerative joint disease), cervical    History of stress test    a. 01/2008 MV: EF 76%. Fair ex tol. No ischemia.   Hyperlipidemia    Hypertension    Hypothyroid    LBBB (left bundle branch block)    Leg pain    a. 01/2018 ABI's wnl.   Lumbar spondylosis    Persistent atrial fibrillation ([place])    a. 01/2018 Event monitor: 45 runs of SVT, longest 14.5 sec. SVT felt to be Afib/flutter-->2% burden. Longest run of AF 4h 59m (CHA2DS2VASc = 5-->Xarelto); b. 03/2019 s/p DCCV; c. 04/2019 Repeat Amio load and DCCV.   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   Past Surgical History:  Past Surgical History:  Procedure Laterality Date   BACK SURGERY  07/2019   CARDIOVERSION N/A 03/26/2018   Procedure: CARDIOVERSION;  Surgeon: Nelva Bush, MD;  Location: [facility] ORS;  Service: Cardiovascular;  Laterality: N/A;   CARDIOVERSION N/A 04/24/2018   Procedure: CARDIOVERSION;  Surgeon: Minna Merritts, MD;  Location: [place] [facility];  Service: Cardiovascular;  Laterality: N/A;   ELECTROPHYSIOLOGY STUDY N/A 02/27/2012   Procedure: ELECTROPHYSIOLOGY STUDY;  Surgeon: Evans Lance, MD;  Location: [facility] CATH LAB;  Service: Cardiovascular;  Laterality: N/A;   EPS and ablation for SVT  5/13   slow pathway ablation by Dr Lovena Le   EYE SURGERY      surgery for detatched retina   NECK SURGERY  02/2020   SUPRAVENTRICULAR TACHYCARDIA ABLATION N/A 02/27/2012   Procedure: SUPRAVENTRICULAR TACHYCARDIA ABLATION;  Surgeon: Evans Lance, MD;  Location: [facility] CATH LAB;  Service: Cardiovascular;  Laterality: N/A;   HPI: Patient is a 80 y.o. female with PMHx of anxiety, bronchitis, CHF, CKD, cervical degenerative joint disease, HTN, lumbar spondylosis, and Afib admitted to ED d/t worsening dyspnea and respiratory distress. Patient recently diagnosed with PNA ~3 weeks ago and was hospitalized at that time as well. Current dx: multi-focal PNA. Upon admission, patient's placed on 14L of O2 via [place]/non-rebreather, which has decreaed to 6-7L via [place]. Per patient report, patient with back/neck surgery last May, which was described as ACDF surgery. Since surgery, she stated that she has had "bouts of swallowing difficulty" and "feeling food stuck in her throat". She is currently consuming a regular/thin liquid diet. SLP to complete BSE today.   Subjective: Reports swallowing difficulties since ACDF, per pt report occurred in May 2021    Assessment / Plan / Recommendation  CHL IP CLINICAL IMPRESSIONS 11/22/2020  Clinical Impression Patient presents with mild pharyngeal dysphagia secondary to pharyngeal weakness, and age-related structural changes of the cervical spine which impact pharyngeal clearance of solids. Oral stage of swallowing is within normal limits, with adequate oral control, bolus formation, rotary mastication and anterior to posterior transfer. Swallow initiation is  timely at the level of the valleculae. Pharyngeal stage is characterized by adequate tongue base retraction and hyolaryngeal excursion, and mildly reduced pharyngeal constriction which leads to retention of solids along the posterior pharyngeal wall approximately level of C3-4. There is also intermittent valleculae, pyriform sinus residue with solids vs liquids. Pt is typically able to clear or  reduce residue with a variety of maneuvers, including dry swallow, liquid wash, or cough/hock to clear the pharynx. In some cases she was not completely able to clear residue, posing risk for aspiration, although none was observed during this study. There is no laryngeal penetration. Several compensatory maneuvers were attempted including head turn R and L (not effective) and chin tuck, which reduced overall residue in the pharynx. Cervical hardware with fusion of C5-6 visible but did not appear to impede bolus flow/clearance. Amplitude and duration of cricopharyngeal opening is within functional limits. Recommend pt continue regular diet with thin liquids, with modification of chopped meats, using extra sauce/gravy to moisten dry foods. Pt should tuck chin with solids, swallow twice or alternate solids and liquids, and clear throat occasionally. Given pt's recurrent PNAs educated on role of oral care in reducing aspiration risk and encouraged pt to perform oral care before and after meals.  SLP Visit Diagnosis Dysphagia, pharyngeal phase (R13.13)  Attention and concentration deficit following --  Frontal lobe and executive function deficit following --  Impact on safety and function Moderate aspiration risk      CHL IP TREATMENT RECOMMENDATION 11/22/2020  Treatment Recommendations Therapy as outlined in treatment plan below     Prognosis 11/22/2020  Prognosis for Safe Diet Advancement Fair  Barriers to Reach Goals Time post onset  Barriers/Prognosis Comment --    CHL IP DIET RECOMMENDATION 11/22/2020  SLP Diet Recommendations Regular solids;Dysphagia 3 (Mech soft) solids;Thin liquid  Liquid Administration via Cup  Medication Administration Whole meds with liquid  Compensations Follow solids with liquid;Clear throat intermittently;Chin tuck  Postural Changes Seated upright at 90 degrees      CHL IP OTHER RECOMMENDATIONS 11/22/2020  Recommended Consults --  Oral Care Recommendations Oral care  before and after PO  Other Recommendations --      CHL IP FOLLOW UP RECOMMENDATIONS 11/20/2020  Follow up Recommendations Other (comment)      CHL IP FREQUENCY AND DURATION 11/22/2020  Speech Therapy Frequency (ACUTE ONLY) min 2x/week  Treatment Duration 2 weeks           CHL IP ORAL PHASE 11/22/2020  Oral Phase WFL  Oral - Pudding Teaspoon --  Oral - Pudding Cup --  Oral - Honey Teaspoon --  Oral - Honey Cup --  Oral - Nectar Teaspoon --  Oral - Nectar Cup --  Oral - Nectar Straw --  Oral - Thin Teaspoon --  Oral - Thin Cup --  Oral - Thin Straw --  Oral - Puree --  Oral - Mech Soft --  Oral - Regular --  Oral - Multi-Consistency --  Oral - Pill --  Oral Phase - Comment --    CHL IP PHARYNGEAL PHASE 11/22/2020  Pharyngeal Phase Impaired  Pharyngeal- Pudding Teaspoon --  Pharyngeal --  Pharyngeal- Pudding Cup --  Pharyngeal --  Pharyngeal- Honey Teaspoon --  Pharyngeal --  Pharyngeal- Honey Cup --  Pharyngeal --  Pharyngeal- Nectar Teaspoon --  Pharyngeal --  Pharyngeal- Nectar Cup [facility]  Pharyngeal Material does not enter airway  Pharyngeal- Nectar Straw --  Pharyngeal --  Pharyngeal- Thin Teaspoon --  Pharyngeal --  Pharyngeal- Thin Cup [facility]  Pharyngeal Material does not enter airway  Pharyngeal- Thin Straw --  Pharyngeal --  Pharyngeal- Puree Pharyngeal residue - valleculae;Pharyngeal residue - pyriform;Reduced pharyngeal peristalsis  Pharyngeal Material does not enter airway  Pharyngeal- Mechanical Soft Reduced pharyngeal peristalsis;Pharyngeal residue - valleculae;Pharyngeal residue - pyriform;Pharyngeal residue - posterior pharnyx  Pharyngeal Material does not enter airway  Pharyngeal- Regular --  Pharyngeal --  Pharyngeal- Multi-consistency --  Pharyngeal --  Pharyngeal- Pill --  Pharyngeal --  Pharyngeal Comment --     CHL IP CERVICAL ESOPHAGEAL PHASE 11/22/2020  Cervical Esophageal Phase WFL  Pudding Teaspoon --  Pudding Cup --  Honey Teaspoon  --  Honey Cup --  Nectar Teaspoon --  Nectar Cup --  Nectar Straw --  Thin Teaspoon --  Thin Cup --  Thin Straw --  Puree --  Mechanical Soft --  Regular --  Multi-consistency --  Pill --  Cervical Esophageal Comment --    Deneise Lever, MS, CCC-SLP Speech-Language Pathologist  Aliene Altes 11/22/2020, 1:59 PM

## 2020-11-22 NOTE — ED Notes (Signed)
Report received from [place], [place].

## 2020-11-22 NOTE — Telephone Encounter (Signed)
Requested Prescriptions  Pending Prescriptions Disp Refills  . levothyroxine (SYNTHROID) 137 MCG tablet [Pharmacy Med Name: LEVOTHYROXINE 0.137MG  (137MCG) TAB] 90 tablet 1    Sig: TAKE 1 TABLET(137 MCG) BY MOUTH EVERY MORNING     Endocrinology:  Hypothyroid Agents Failed - 11/22/2020  8:08 AM      Failed - TSH needs to be rechecked within 3 months after an abnormal result. Refill until TSH is due.      Passed - TSH in normal range and within 360 days    TSH  Date Value Ref Range Status  10/30/2020 0.852 0.350 - 4.500 uIU/mL Final    Comment:    Performed by a 3rd Generation assay with a functional sensitivity of <=0.01 uIU/mL. Performed at [facility], [address] [place], [place] [address]   09/13/2020 5.09 (H) 0.40 - 4.50 mIU/L Final         Passed - Valid encounter within last 12 months    Recent Outpatient Visits          4 days ago Cough   [facility] Steele Sizer, MD   2 months ago GAD (generalized anxiety disorder)   [facility] Steele Sizer, MD   3 months ago Viral upper respiratory tract infection   [facility] Steele Sizer, MD   7 months ago Hospital discharge follow-up   [facility] Steele Sizer, MD   9 months ago [facility] Steele Sizer, MD      Future Appointments            In 3 months Ancil Boozer, Drue Stager, MD [facility], [place]   In 6 months  [place], [facility]

## 2020-11-22 NOTE — Progress Notes (Signed)
Patient ID: CHARDAY EBERSOLE, female   DOB: 03-Nov-1940, 80 y.o.   MRN: [id number]  PROGRESS NOTE    AURIANNA CAMUSO  [id number] DOB: 1941-02-18 DOA: 11/19/2020 PCP: Steele Sizer, MD   Brief Narrative:  80 year old female with history of hypertension, hyperlipidemia, hypothyroidism, paroxysmal atrial fibrillation, chronic systolic heart failure, recent admission and discharge on 11/02/2020 for pneumonia anxiety presented with worsening shortness of breath.  On presentation, she was hypoxic requiring supplemental oxygen with leukocytosis.  COVID-19 PCR was negative.  Chest x-ray showed multifocal pneumonia.  She was started on IV fluids and antibiotics.  Assessment & Plan:   Sepsis: Present on admission Multifocal bacterial pneumonia -Patient presented with worsening shortness of breath and was hypoxic, tachypneic requiring supplemental oxygen and had leukocytosis and multifocal pneumonia on presentation -Treated with IV fluids and antibiotics.  Subsequently IV fluids have been discontinued.  Continue Levaquin.  COVID-19 and flu tests was negative. -CTA chest was negative for PE but showed multifocal pneumonia.  Patient was also started on IV steroids.  Decrease Solu-Medrol to 40 mg IV every daily. -Respiratory status improving, currently on 2 L oxygen via nasal cannula.  Wean off as able.  Does not wear oxygen at home. -Diet as per his recommendations  -recommend outpatient pulmonary evaluation and follow-up  Leukocytosis -Monitor.  Labs pending today  Hypokalemia -Labs pending today  Anemia of chronic disease -Questionable cause.  Hemoglobin stable.  Hypothyroidism -Continue Synthroid  Dyslipidemia -Continue statin  Essential hypertension -Continue Diovan and Toprol-XL  Chronic systolic heart failure -Currently compensated.  Continue Diovan, Toprol-XL and Lasix.  Strict input and output.  Daily weights.  Fluid restriction.  Outpatient follow-up with cardiology  Paroxysmal  A. Fib -Continue Toprol-XL and Eliquis  Generalized conditioning -PT eval   DVT prophylaxis: Eliquis Code Status: Full Family Communication: Spoke to daughter on phone on 11/21/2020 Disposition Plan: Status is: Inpatient  Remains inpatient appropriate because:Inpatient level of care appropriate due to severity of illness   Dispo: The patient is from: Home              Anticipated d/c is to: Home              Anticipated d/c date is: 1 day              Patient currently is not medically stable to d/c.   Difficult to place patient No   Consultants: None  Procedures: None  Antimicrobials:  Anti-infectives (From admission, onward)   Start     Dose/Rate Route Frequency Ordered Stop   11/21/20 0630  levofloxacin (LEVAQUIN) IVPB 750 mg        750 mg 100 mL/hr over 90 Minutes Intravenous Every 24 hours 11/20/20 0626 11/25/20 0629   11/20/20 0500  levofloxacin (LEVAQUIN) IVPB 750 mg  Status:  Discontinued        750 mg 100 mL/hr over 90 Minutes Intravenous Every 48 hours 11/20/20 0305 11/20/20 0625   11/20/20 0430  levofloxacin (LEVAQUIN) IVPB 750 mg  Status:  Discontinued        750 mg 100 mL/hr over 90 Minutes Intravenous  Once 11/20/20 0422 11/20/20 0436   11/20/20 0145  vancomycin (VANCOCIN) IVPB 1000 mg/200 mL premix  Status:  Discontinued        1,000 mg 200 mL/hr over 60 Minutes Intravenous  Once 11/20/20 0132 11/20/20 0137   11/20/20 0145  meropenem (MERREM) 1 g in sodium chloride 0.9 % 100 mL IVPB  Status:  Discontinued  1 g 200 mL/hr over 30 Minutes Intravenous  Once 11/20/20 0132 11/20/20 0136   11/20/20 0145  meropenem (MERREM) 1 g in sodium chloride 0.9 % 100 mL IVPB        1 g 200 mL/hr over 30 Minutes Intravenous  Once 11/20/20 0136 11/20/20 0247   11/20/20 0145  vancomycin (VANCOREADY) IVPB 1500 mg/300 mL        1,500 mg 150 mL/hr over 120 Minutes Intravenous  Once 11/20/20 0138 11/20/20 [id number]        Subjective: Patient seen and examined at bedside.   Still complains of intermittent cough and shortness of breath but feels slightly better compared to yesterday.  No overnight fever, vomiting or chest pain reported.  Objective: Vitals:   11/22/20 0000 11/22/20 0200 11/22/20 0530 11/22/20 0600  BP: (!) 110/40 (!) 115/46 (!) 122/50 100/86  Pulse: (!) 56 (!) 53 (!) 52 78  Resp: (!) 21 19 17  (!) 21  Temp:      TempSrc:      SpO2: 98% 99% 98% 94%  Weight:      Height:        Intake/Output Summary (Last 24 hours) at 11/22/2020 0748 Last data filed at 11/21/2020 1713 Gross per 24 hour  Intake --  Output 1400 ml  Net -1400 ml   Filed Weights   11/19/20 2332  Weight: 60.8 kg    Examination:  General exam: No acute distress.  Currently on 2 L oxygen via nasal cannula Respiratory system: Decreased breath sounds at bases bilaterally with some scattered crackles and tachypnea  cardiovascular system: Intermittently bradycardic, S1-S2 heard Gastrointestinal system: Abdomen is nondistended, soft and nontender.  Bowel sounds are heard  extremities: Mild lower extremity edema present; no cyanosis Central nervous system: Awake and alert.  No focal neurological deficits.  Moves extremities Skin: No ecchymosis/lesions Psychiatry: Affect is flat    Data Reviewed: I have personally reviewed following labs and imaging studies  CBC: Recent Labs  Lab 11/18/20 0957 11/19/20 2338 11/20/20 0450 11/21/20 0535  WBC 10.5 12.8* 13.8* 13.8*  NEUTROABS 8.7* 11.3*  --  12.4*  HGB 10.5* 10.6* 9.0* 9.4*  HCT 32.1* 32.0* 27.7* 27.5*  MCV 93.9 92.8 93.6 90.8  PLT 255 260 234 0000000   Basic Metabolic Panel: Recent Labs  Lab 11/18/20 0957 11/19/20 2338 11/20/20 0450 11/21/20 0535  NA 138 137 137 142  K 3.5 3.6 3.8 3.3*  CL 103 101 104 107  CO2 23 25 22 25   GLUCOSE 177* 155* 175* 169*  BUN 18 23 21 21   CREATININE 1.06* 1.00 0.83 0.83  CALCIUM 8.8* 8.8* 8.3* 8.7*  MG  --   --   --  2.3   GFR: Estimated Creatinine Clearance: 52.3 mL/min (by  C-G formula based on SCr of 0.83 mg/dL). Liver Function Tests: Recent Labs  Lab 11/18/20 0957 11/19/20 2338 11/21/20 0535  AST 25 38 26  ALT 18 29 21   ALKPHOS 80 86 64  BILITOT 0.6 0.7 0.5  PROT 7.4 7.5 6.1*  ALBUMIN 3.3* 3.0* 2.6*   No results for input(s): LIPASE, AMYLASE in the last 168 hours. No results for input(s): AMMONIA in the last 168 hours. Coagulation Profile: Recent Labs  Lab 11/19/20 2338  INR 1.4*   Cardiac Enzymes: No results for input(s): CKTOTAL, CKMB, CKMBINDEX, TROPONINI in the last 168 hours. BNP (last 3 results) No results for input(s): PROBNP in the last 8760 hours. HbA1C: No results for input(s): HGBA1C in the last 72  hours. CBG: No results for input(s): GLUCAP in the last 168 hours. Lipid Profile: No results for input(s): CHOL, HDL, LDLCALC, TRIG, CHOLHDL, LDLDIRECT in the last 72 hours. Thyroid Function Tests: No results for input(s): TSH, T4TOTAL, FREET4, T3FREE, THYROIDAB in the last 72 hours. Anemia Panel: No results for input(s): VITAMINB12, FOLATE, FERRITIN, TIBC, IRON, RETICCTPCT in the last 72 hours. Sepsis Labs: Recent Labs  Lab 11/18/20 0957 11/19/20 2338 11/20/20 0236 11/20/20 0450  PROCALCITON <0.10  --   --   --   LATICACIDVEN  --  1.3 1.4 0.9    Recent Results (from the past 240 hour(s))  Novel Coronavirus, NAA ([facility])     Status: None   Collection Time: 11/18/20  5:59 PM   Specimen: Nasopharyngeal(NP) swabs in vial transport medium   Nasopharynge  Screenin  Result Value Ref Range Status   SARS-CoV-2, NAA Not Detected Not Detected Final    Comment: This nucleic acid amplification test was developed and its performance characteristics determined by [facility]. Nucleic acid amplification tests include RT-PCR and TMA. This test has not been FDA cleared or approved. This test has been authorized by FDA under an Emergency Use Authorization (EUA). This test is only authorized for the duration of time the  declaration that circumstances exist justifying the authorization of the emergency use of in vitro diagnostic tests for detection of SARS-CoV-2 virus and/or diagnosis of COVID-19 infection under section 564(b)(1) of the Act, 21 U.S.C. 409WJX-9(J360bbb-3(b) (1), unless the authorization is terminated or revoked sooner. When diagnostic testing is negative, the possibility of a false negative result should be considered in the context of a patient's recent exposures and the presence of clinical signs and symptoms consistent with COVID-19. An individual without symptoms of COVID-19 and who is not shedding SARS-CoV-2 virus wo uld expect to have a negative (not detected) result in this assay.   SARS-COV-2, NAA 2 DAY TAT     Status: None   Collection Time: 11/18/20  5:59 PM   Nasopharynge  Screenin  Result Value Ref Range Status   SARS-CoV-2, NAA 2 DAY TAT Performed  Final  SARS Coronavirus 2 by RT PCR (hospital order, [facility]) Nasopharyngeal Nasopharyngeal Swab     Status: None   Collection Time: 11/19/20 11:38 PM   Specimen: Nasopharyngeal Swab  Result Value Ref Range Status   SARS Coronavirus 2 NEGATIVE NEGATIVE Final    Comment: (NOTE) SARS-CoV-2 target nucleic acids are NOT DETECTED.  The SARS-CoV-2 RNA is generally detectable in upper and lower respiratory specimens during the acute phase of infection. The lowest concentration of SARS-CoV-2 viral copies this assay can detect is 250 copies / mL. A negative result does not preclude SARS-CoV-2 infection and should not be used as the sole basis for treatment or other patient management decisions.  A negative result may occur with improper specimen collection / handling, submission of specimen other than nasopharyngeal swab, presence of viral mutation(s) within the areas targeted by this assay, and inadequate number of viral copies (<250 copies / mL). A negative result must be combined with clinical observations,  patient history, and epidemiological information.  Fact Sheet for Patients:   BoilerBrush.com.cyhttps://www.fda.gov/media/136312/download  Fact Sheet for Healthcare Providers: https://pope.com/https://www.fda.gov/media/136313/download  This test is not yet approved or  cleared by the [place] FDA and has been authorized for detection and/or diagnosis of SARS-CoV-2 by FDA under an Emergency Use Authorization (EUA).  This EUA will remain in effect (meaning this test can be used) for  the duration of the COVID-19 declaration under Section 564(b)(1) of the Act, 21 U.S.C. section 360bbb-3(b)(1), unless the authorization is terminated or revoked sooner.  Performed at [facility], [address] [place], [place] [address]   Blood culture (routine x 2)     Status: None (Preliminary result)   Collection Time: 11/20/20  1:33 AM   Specimen: Right Antecubital; Blood  Result Value Ref Range Status   Specimen Description RIGHT ANTECUBITAL  Final   Special Requests   Final    BOTTLES DRAWN AEROBIC AND ANAEROBIC Blood Culture adequate volume   Culture   Final    NO GROWTH 1 DAY Performed at [facility], [address], [place], [place] [address]    Report Status PENDING  Incomplete  Culture, blood (routine x 2) Call MD if unable to obtain prior to antibiotics being given     Status: None (Preliminary result)   Collection Time: 11/20/20  7:52 AM   Specimen: BLOOD  Result Value Ref Range Status   Specimen Description BLOOD BLOOD RIGHT HAND  Final   Special Requests   Final    BOTTLES DRAWN AEROBIC AND ANAEROBIC Blood Culture results may not be optimal due to an inadequate volume of blood received in culture bottles   Culture   Final    NO GROWTH 1 DAY Performed at [facility], [address], [place], [place] [address]    Report Status PENDING  Incomplete  Culture, blood (routine x 2) Call MD if unable to obtain prior to antibiotics being given     Status: None (Preliminary result)    Collection Time: 11/20/20  7:52 AM   Specimen: BLOOD  Result Value Ref Range Status   Specimen Description BLOOD BLOOD LEFT HAND  Final   Special Requests   Final    BOTTLES DRAWN AEROBIC AND ANAEROBIC Blood Culture results may not be optimal due to an inadequate volume of blood received in culture bottles   Culture   Final    NO GROWTH 1 DAY Performed at [facility], [address] [place], [place] [address]    Report Status PENDING  Incomplete  Resp Panel by RT-PCR (Flu A&B, Covid)     Status: None   Collection Time: 11/20/20 12:18 PM  Result Value Ref Range Status   SARS Coronavirus 2 by RT PCR NEGATIVE NEGATIVE Final    Comment: (NOTE) SARS-CoV-2 target nucleic acids are NOT DETECTED.  The SARS-CoV-2 RNA is generally detectable in upper respiratory specimens during the acute phase of infection. The lowest concentration of SARS-CoV-2 viral copies this assay can detect is 138 copies/mL. A negative result does not preclude SARS-Cov-2 infection and should not be used as the sole basis for treatment or other patient management decisions. A negative result may occur with  improper specimen collection/handling, submission of specimen other than nasopharyngeal swab, presence of viral mutation(s) within the areas targeted by this assay, and inadequate number of viral copies(<138 copies/mL). A negative result must be combined with clinical observations, patient history, and epidemiological information. The expected result is Negative.  Fact Sheet for Patients:  EntrepreneurPulse.com.au  Fact Sheet for Healthcare Providers:  IncredibleEmployment.be  This test is no t yet approved or cleared by the [place] FDA and  has been authorized for detection and/or diagnosis of SARS-CoV-2 by FDA under an Emergency Use Authorization (EUA). This EUA will remain  in effect (meaning this test can be used) for the duration of the COVID-19  declaration  under Section 564(b)(1) of the Act, 21 U.S.C.section 360bbb-3(b)(1), unless the authorization is terminated  or revoked sooner.       Influenza A by PCR NEGATIVE NEGATIVE Final   Influenza B by PCR NEGATIVE NEGATIVE Final    Comment: (NOTE) The Xpert Xpress SARS-CoV-2/FLU/RSV plus assay is intended as an aid in the diagnosis of influenza from Nasopharyngeal swab specimens and should not be used as a sole basis for treatment. Nasal washings and aspirates are unacceptable for Xpert Xpress SARS-CoV-2/FLU/RSV testing.  Fact Sheet for Patients: EntrepreneurPulse.com.au  Fact Sheet for Healthcare Providers: IncredibleEmployment.be  This test is not yet approved or cleared by the [place] FDA and has been authorized for detection and/or diagnosis of SARS-CoV-2 by FDA under an Emergency Use Authorization (EUA). This EUA will remain in effect (meaning this test can be used) for the duration of the COVID-19 declaration under Section 564(b)(1) of the Act, 21 U.S.C. section 360bbb-3(b)(1), unless the authorization is terminated or revoked.  Performed at [facility], [address] [place], [place] [address]   Culture, sputum-assessment     Status: None   Collection Time: 11/21/20  2:15 PM   Specimen: Sputum  Result Value Ref Range Status   Specimen Description SPUTUM  Final   Special Requests NONE  Final   Sputum evaluation   Final    Sputum specimen not acceptable for testing.  Please recollect.   NOTIFIED ASHTON PETERS AT [id number] ON 11/21/20 BY SS Performed at [facility], [address] [place], [place] [address]    Report Status 11/21/2020 FINAL  Final         Radiology Studies: No results found.      Scheduled Meds: . amiodarone  200 mg Oral Daily  . apixaban  5 mg Oral BID  . doxazosin  1 mg Oral QPM  . furosemide  20 mg Oral q AM  . guaiFENesin  600 mg Oral BID  . ipratropium-albuterol  3  mL Nebulization QID  . irbesartan  150 mg Oral Daily  . levothyroxine  137 mcg Oral Q0600  . methylPREDNISolone (SOLU-MEDROL) injection  40 mg Intravenous Q12H  . metoprolol succinate  50 mg Oral Q breakfast  . rosuvastatin  40 mg Oral Daily   Continuous Infusions: . levofloxacin (LEVAQUIN) IV 750 mg (11/22/20 9628)          Aline August, MD [facility] 11/22/2020, 7:48 AM

## 2020-11-22 NOTE — TOC Initial Note (Signed)
Transition of Care [facility]) - Initial/Assessment Note    Patient Details  Name: [name] MRN: [id number] Date of Birth: 05/20/1941  Transition of Care [facility]) CM/SW Contact:    Magnus Ivan, LCSW Phone Number: 11/22/2020, 3:12 PM  Clinical Narrative:                Patient known to this CSW from previous hospitalization this week. Patient lives alone. Family is supportive. Patient has a RW. Patient denies changes in her home set up since last hospital stay. Informed patient of recommendation for HHPT. Patient refuses Home Health. Says she does not feel she needs it, but will let staff know if she changes her mind. Patient reported her daughter has arranged for her to have 24 hour supervision from friends/family at home when she discharges. Boyfriend will pick up at time of DC.   Expected Discharge Plan: Home/Self Care Barriers to Discharge: Continued Medical Work up   Patient Goals and CMS Choice Patient states their goals for this hospitalization and ongoing recovery are:: home with self care CMS Medicare.gov Compare Post Acute Care list provided to:: Patient Choice offered to / list presented to : Patient  Expected Discharge Plan and Services Expected Discharge Plan: Home/Self Care       Living arrangements for the past 2 months: Single Family Home                                      Prior Living Arrangements/Services Living arrangements for the past 2 months: Single Family Home Lives with:: Self Patient language and need for interpreter reviewed:: Yes Do you feel safe going back to the place where you live?: Yes      Need for Family Participation in Patient Care: Yes (Comment) Care giver support system in place?: Yes (comment) Current home services: DME Criminal Activity/Legal Involvement Pertinent to Current Situation/Hospitalization: No - Comment as needed  Activities of Daily Living Home Assistive Devices/Equipment: Blood pressure cuff ADL Screening  (condition at time of admission) Patient's cognitive ability adequate to safely complete daily activities?: Yes Is the patient deaf or have difficulty hearing?: No Does the patient have difficulty seeing, even when wearing glasses/contacts?: No Does the patient have difficulty concentrating, remembering, or making decisions?: No Patient able to express need for assistance with ADLs?: Yes Does the patient have difficulty dressing or bathing?: No Independently performs ADLs?: Yes (appropriate for developmental age) Does the patient have difficulty walking or climbing stairs?: Yes Weakness of Legs: Both Weakness of Arms/Hands: None  Permission Sought/Granted Permission sought to share information with : Facility Retail banker granted to share information with : Yes, Verbal Permission Granted              Emotional Assessment       Orientation: : Oriented to Self,Oriented to Place,Oriented to  Time,Oriented to Situation Alcohol / Substance Use: Not Applicable Psych Involvement: No (comment)  Admission diagnosis:  Viral pneumonia [J12.9] Acute respiratory failure with hypoxemia (HCC) [J96.01] Multifocal pneumonia [J18.9] Sepsis, due to unspecified organism, unspecified whether acute organ dysfunction present [facility]) [A41.9] Patient Active Problem List   Diagnosis Date Noted  . Severe sepsis ([place])   . Acquired thrombophilia ([place])   . Community acquired pneumonia 10/30/2020  . Acute respiratory failure ([place]) 10/30/2020  . Multifocal pneumonia 10/30/2020  . Pyelonephritis 03/30/2020  . Acute pyelonephritis 03/29/2020  . Hydronephrosis of right kidney 03/29/2020  .  Depression 03/29/2020  . Chronic diastolic CHF (congestive heart failure) ([place]) 05/24/2018  . AF (paroxysmal atrial fibrillation) ([place]) 05/24/2018  . CKD (chronic kidney disease), stage III ([place]) 05/17/2018  . 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 Shortness of breath 05/07/2018  .  Pulmonary hypertension, unspecified ([place]) 04/05/2018  . Acute on chronic combined systolic and diastolic CHF (congestive heart failure) ([place]) 04/05/2018  . Paroxysmal sinus tachycardia ([place]) 01/29/2018  . Leg pain 01/04/2018  . PAD (peripheral artery disease) ([place]) 01/04/2018  . Carotid artery calcification, bilateral 12/19/2017  . Cervical radiculitis 12/19/2017  . Hyperglycemia 03/06/2016  . Hypothyroid 04/21/2015  . 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 Anxiety 04/21/2015  . Hyperlipidemia 04/21/2015  . Diuretic-induced hypokalemia 04/21/2015  . Palpitations 12/18/2012  . Hypertension 05/27/2012   PCP:  Steele Sizer, MD Pharmacy:   [facility] [phone] - [address], [address] [place] [address] Phone: [phone] Fax: [phone]     Social Determinants of Health (SDOH) Interventions    Readmission Risk Interventions Readmission Risk Prevention Plan 11/22/2020  Transportation Screening Complete  PCP or Specialist Appt within 3-5 Days Complete  HRI or [place] Complete  Social Work Consult for [place] Planning/Counseling Complete  Palliative Care Screening Not Applicable  Medication Review Press photographer) Complete  Some recent data might be hidden

## 2020-11-23 MED ORDER — SODIUM CHLORIDE 0.9 % IV SOLN
INTRAVENOUS | Status: DC | PRN
  Administered 2020-11-23: 250 mL via INTRAVENOUS

## 2020-11-23 MED ORDER — POTASSIUM CHLORIDE CRYS ER 10 MEQ PO TBCR
10.0000 meq | EXTENDED_RELEASE_TABLET | Freq: Every day | ORAL | Status: DC
  Administered 2020-11-23 – 2020-11-24 (×2): 10 meq via ORAL
  Filled 2020-11-23 (×2): qty 1

## 2020-11-23 MED ORDER — FUROSEMIDE 10 MG/ML IJ SOLN
20.0000 mg | Freq: Once | INTRAMUSCULAR | Status: AC
  Administered 2020-11-23: 20 mg via INTRAVENOUS
  Filled 2020-11-23: qty 4

## 2020-11-23 MED ORDER — FUROSEMIDE 10 MG/ML IJ SOLN: 20.0000 mg | Freq: Once | INTRAMUSCULAR | Status: DC

## 2020-11-23 NOTE — Progress Notes (Addendum)
Oxygen desaturated to 88% on room air during ambulation around nursing station. Pt's level of oxygen increased back to 94% on room air at rest. MD notified.

## 2020-11-23 NOTE — Care Management Important Message (Signed)
Important Message  Patient Details  Name: [name] MRN: [id number] Date of Birth: 26-Mar-1941   Medicare Important Message Given:  Yes     Juliann Pulse A Albert Devaul 11/23/2020, 1:01 PM

## 2020-11-23 NOTE — Progress Notes (Signed)
Physical Therapy Treatment Patient Details Name: [name] MRN: [id number] DOB: 1941-07-24 Today's Date: 11/23/2020    History of Present Illness Pt is a 80 y.o. female presenting to hospital 1/29 with SOB.  Pt admitted with bacterial multifocal PNA, sepsis d/t PNA, and hypothyroidism  PMH includes anxiety, chronic combined systolic and diastolic congestive heart failure, CKD, htn, LBBB, leg pain, persistant a-fib, SVT, mitral valve insufficiency, pulmonary htn, PAD, h/o back sx, h/o cardioversion, h/o neck surgery, h/o SVT ablation.    PT Comments    Patient received in bed, she is agreeable to PT session. Continues to be on [place] at 2 lpm. Supplemental O2 removed  To ambulate to bathroom with saturations dropping to low 80%s. [place] replaced and patient ambulated 200 feet with RW and min guard. O2 saturations at 90% after walking. Patient will continue to benefit from skilled PT while here to improve activity tolerance.   SaO2 on room air at rest = 93% SaO2 on room air while ambulating = 82% SaO2 on 2 liters of O2 while ambulating = 90%    Follow Up Recommendations  Home health PT     Equipment Recommendations  None recommended by PT    Recommendations for Other Services       Precautions / Restrictions Precautions Precautions: Fall Restrictions Weight Bearing Restrictions: No    Mobility  Bed Mobility Overal bed mobility: Independent                Transfers Overall transfer level: Modified independent Equipment used: None Transfers: Sit to/from Stand Sit to Stand: Supervision         General transfer comment: use of grab bar in bathroom, no assist from bed.  Ambulation/Gait Ambulation/Gait assistance: Supervision Gait Distance (Feet): 150 Feet Assistive device: Rolling walker (2 wheeled) Gait Pattern/deviations: Step-through pattern;Narrow base of support Gait velocity: WNL   General Gait Details: patient reports she is using walker here because she is  blind in her right eye and at home she wears a contact in it. Normally does not use ad.   Stairs             Wheelchair Mobility    Modified Rankin (Stroke Patients Only)       Balance Overall balance assessment: Modified Independent Sitting-balance support: No upper extremity supported;Feet supported Sitting balance-Leahy Scale: Normal Sitting balance - Comments: steady sitting reaching outside BOS   Standing balance support: Bilateral upper extremity supported;During functional activity Standing balance-Leahy Scale: Good                              Cognition Arousal/Alertness: Awake/alert Behavior During Therapy: WFL for tasks assessed/performed Overall Cognitive Status: Within Functional Limits for tasks assessed                                        Exercises      General Comments        Pertinent Vitals/Pain Pain Assessment: No/denies pain    Home Living                      Prior Function            PT Goals (current goals can now be found in the care plan section) Acute Rehab PT Goals Patient Stated Goal: to go home PT Goal Formulation: With  patient Time For Goal Achievement: 12/05/20 Potential to Achieve Goals: Good Progress towards PT goals: Progressing toward goals    Frequency    Min 2X/week      PT Plan Current plan remains appropriate    Co-evaluation              AM-PAC PT "6 Clicks" Mobility   Outcome Measure  Help needed turning from your back to your side while in a flat bed without using bedrails?: None Help needed moving from lying on your back to sitting on the side of a flat bed without using bedrails?: None Help needed moving to and from a bed to a chair (including a wheelchair)?: None Help needed standing up from a chair using your arms (e.g., wheelchair or bedside chair)?: None Help needed to walk in hospital room?: A Little Help needed climbing 3-5 steps with a railing?  : A Little 6 Click Score: 22    End of Session Equipment Utilized During Treatment: Gait belt;Oxygen Activity Tolerance: Patient tolerated treatment well Patient left: in bed;with call bell/phone within reach;with family/visitor present Nurse Communication: Mobility status PT Visit Diagnosis: Other abnormalities of gait and mobility (R26.89);Muscle weakness (generalized) (M62.81);Difficulty in walking, not elsewhere classified (R26.2)     Time: 1000-1025 PT Time Calculation (min) (ACUTE ONLY): 25 min  Charges:  $Gait Training: 23-37 mins                     Montie Gelardi, PT, GCS 11/23/20,10:53 AM

## 2020-11-23 NOTE — Progress Notes (Addendum)
Patient ID: [name], female   DOB: April 06, 1941, 80 y.o.   MRN: [id number]  PROGRESS NOTE    [name]  WUJ:[id number] DOB: [birth year] DOA: 11/19/2020 PCP: Steele Sizer, MD   Brief Narrative:  80 year old female with history of hypertension, hyperlipidemia, hypothyroidism, paroxysmal atrial fibrillation, chronic systolic heart failure, recent admission and discharge on 11/02/2020 for pneumonia anxiety presented with worsening shortness of breath.  On presentation, she was hypoxic requiring supplemental oxygen with leukocytosis.  COVID-19 PCR was negative.  Chest x-ray showed multifocal pneumonia.  She was started on IV fluids and antibiotics.  Assessment & Plan:   Sepsis: Present on admission Multifocal bacterial pneumonia -Patient presented with worsening shortness of breath and was hypoxic, tachypneic requiring supplemental oxygen and had leukocytosis and multifocal pneumonia on presentation -Treated with IV fluids and antibiotics.  Subsequently IV fluids have been discontinued.  Currently on Levaquin, today's day #5.  COVID-19 and flu tests was negative. -CTA chest was negative for PE but showed multifocal pneumonia.  Patient was also started on IV steroids.  Continue Solu-Medrol 40 mg IV daily for now. -Respiratory status improving, currently on 2 L oxygen via nasal cannula.  Wean off as able.  Does not wear oxygen at home. -Diet as per SLP recommendations  -recommend outpatient pulmonary evaluation and follow-up  Leukocytosis -Monitor.  No labs today  Hypokalemia -No labs today  Anemia of chronic disease -Questionable cause.  Hemoglobin stable.  Hypothyroidism -Continue Synthroid  Dyslipidemia -Continue statin  Essential hypertension -Continue Diovan and Toprol-XL  Chronic systolic heart failure -Currently compensated.  Continue Diovan, Toprol-XL and Lasix.  Strict input and output.  Daily weights.  Fluid restriction.  Outpatient follow-up with cardiology.   Will give a dose of 20 mg IV Lasix additional as she is still requiring supplemental oxygen.  Paroxysmal A. Fib -Continue Toprol-XL and Eliquis  Generalized conditioning -PT recommended home health PT.  Patient not interested in the same.   DVT prophylaxis: Eliquis Code Status: Full Family Communication: Spoke to daughter on phone on 11/21/2020 Disposition Plan: Status is: Inpatient  Remains inpatient appropriate because:Inpatient level of care appropriate due to severity of illness   Dispo: The patient is from: Home              Anticipated d/c is to: Home              Anticipated d/c date is: 1 day if respiratory status improves.  Might need home oxygen on discharge.              Patient currently is not medically stable to d/c.   Difficult to place patient No   Consultants: None  Procedures: None  Antimicrobials:  Anti-infectives (From admission, onward)   Start     Dose/Rate Route Frequency Ordered Stop   11/21/20 0630  levofloxacin (LEVAQUIN) IVPB 750 mg        750 mg 100 mL/hr over 90 Minutes Intravenous Every 24 hours 11/20/20 0626 11/25/20 0629   11/20/20 0500  levofloxacin (LEVAQUIN) IVPB 750 mg  Status:  Discontinued        750 mg 100 mL/hr over 90 Minutes Intravenous Every 48 hours 11/20/20 0305 11/20/20 0625   11/20/20 0430  levofloxacin (LEVAQUIN) IVPB 750 mg  Status:  Discontinued        750 mg 100 mL/hr over 90 Minutes Intravenous  Once 11/20/20 0422 11/20/20 0436   11/20/20 0145  vancomycin (VANCOCIN) IVPB 1000 mg/200 mL premix  Status:  Discontinued  1,000 mg 200 mL/hr over 60 Minutes Intravenous  Once 11/20/20 0132 11/20/20 0137   11/20/20 0145  meropenem (MERREM) 1 g in sodium chloride 0.9 % 100 mL IVPB  Status:  Discontinued        1 g 200 mL/hr over 30 Minutes Intravenous  Once 11/20/20 0132 11/20/20 0136   11/20/20 0145  meropenem (MERREM) 1 g in sodium chloride 0.9 % 100 mL IVPB        1 g 200 mL/hr over 30 Minutes Intravenous  Once  11/20/20 0136 11/20/20 0247   11/20/20 0145  vancomycin (VANCOREADY) IVPB 1500 mg/300 mL        1,500 mg 150 mL/hr over 120 Minutes Intravenous  Once 11/20/20 0138 11/20/20 [id number]        Subjective: Patient seen and examined at bedside.  Denies worsening fever, nausea, vomiting.  Breathing is better, still short of breath with minimal exertion. Objective: Vitals:   11/23/20 0840 11/23/20 1030 11/23/20 1159 11/23/20 1208  BP: 140/60  (!) 124/40   Pulse:   (!) 55   Resp:   15   Temp:   97.7 F (36.5 C)   TempSrc:   Oral   SpO2:  93% 97% 97%  Weight: 56.2 kg     Height:        Intake/Output Summary (Last 24 hours) at 11/23/2020 1300 Last data filed at 11/23/2020 0920 Gross per 24 hour  Intake 240 ml  Output 1100 ml  Net -860 ml   Filed Weights   11/19/20 2332 11/23/20 0500 11/23/20 0840  Weight: 60.8 kg 58.2 kg 56.2 kg    Examination:  General exam: On 2 L oxygen via nasal cannula.  No distress.   Respiratory system: Bilateral decreased breath sounds at bases with scattered crackles cardiovascular system: S1-S2 heard, intermittently bradycardic Gastrointestinal system: Abdomen is nondistended, soft and nontender.  Normal bowel sounds heard  extremities: No clubbing.  Mild lower extremity edema present   Data Reviewed: I have personally reviewed following labs and imaging studies  CBC: Recent Labs  Lab 11/18/20 0957 11/19/20 2338 11/20/20 0450 11/21/20 0535  WBC 10.5 12.8* 13.8* 13.8*  NEUTROABS 8.7* 11.3*  --  12.4*  HGB 10.5* 10.6* 9.0* 9.4*  HCT 32.1* 32.0* 27.7* 27.5*  MCV 93.9 92.8 93.6 90.8  PLT 255 260 234 0000000   Basic Metabolic Panel: Recent Labs  Lab 11/18/20 0957 11/19/20 2338 11/20/20 0450 11/21/20 0535  NA 138 137 137 142  K 3.5 3.6 3.8 3.3*  CL 103 101 104 107  CO2 23 25 22 25   GLUCOSE 177* 155* 175* 169*  BUN 18 23 21 21   CREATININE 1.06* 1.00 0.83 0.83  CALCIUM 8.8* 8.8* 8.3* 8.7*  MG  --   --   --  2.3   GFR: Estimated Creatinine  Clearance: 49.6 mL/min (by C-G formula based on SCr of 0.83 mg/dL). Liver Function Tests: Recent Labs  Lab 11/18/20 0957 11/19/20 2338 11/21/20 0535  AST 25 38 26  ALT 18 29 21   ALKPHOS 80 86 64  BILITOT 0.6 0.7 0.5  PROT 7.4 7.5 6.1*  ALBUMIN 3.3* 3.0* 2.6*   No results for input(s): LIPASE, AMYLASE in the last 168 hours. No results for input(s): AMMONIA in the last 168 hours. Coagulation Profile: Recent Labs  Lab 11/19/20 2338  INR 1.4*   Cardiac Enzymes: No results for input(s): CKTOTAL, CKMB, CKMBINDEX, TROPONINI in the last 168 hours. BNP (last 3 results) No results for input(s): PROBNP in  the last 8760 hours. HbA1C: No results for input(s): HGBA1C in the last 72 hours. CBG: No results for input(s): GLUCAP in the last 168 hours. Lipid Profile: No results for input(s): CHOL, HDL, LDLCALC, TRIG, CHOLHDL, LDLDIRECT in the last 72 hours. Thyroid Function Tests: No results for input(s): TSH, T4TOTAL, FREET4, T3FREE, THYROIDAB in the last 72 hours. Anemia Panel: No results for input(s): VITAMINB12, FOLATE, FERRITIN, TIBC, IRON, RETICCTPCT in the last 72 hours. Sepsis Labs: Recent Labs  Lab 11/18/20 0957 11/19/20 2338 11/20/20 0236 11/20/20 0450  PROCALCITON <0.10  --   --   --   LATICACIDVEN  --  1.3 1.4 0.9    Recent Results (from the past 240 hour(s))  Novel Coronavirus, NAA ([facility])     Status: None   Collection Time: 11/18/20  5:59 PM   Specimen: Nasopharyngeal(NP) swabs in vial transport medium   Nasopharynge  Screenin  Result Value Ref Range Status   SARS-CoV-2, NAA Not Detected Not Detected Final    Comment: This nucleic acid amplification test was developed and its performance characteristics determined by [employer]. Nucleic acid amplification tests include RT-PCR and TMA. This test has not been FDA cleared or approved. This test has been authorized by FDA under an Emergency Use Authorization (EUA). This test is only authorized for the  duration of time the declaration that circumstances exist justifying the authorization of the emergency use of in vitro diagnostic tests for detection of SARS-CoV-2 virus and/or diagnosis of COVID-19 infection under section 564(b)(1) of the Act, 21 U.S.C. 902XJD-5(Z) (1), unless the authorization is terminated or revoked sooner. When diagnostic testing is negative, the possibility of a false negative result should be considered in the context of a patient's recent exposures and the presence of clinical signs and symptoms consistent with COVID-19. An individual without symptoms of COVID-19 and who is not shedding SARS-CoV-2 virus wo uld expect to have a negative (not detected) result in this assay.   SARS-COV-2, NAA 2 DAY TAT     Status: None   Collection Time: 11/18/20  5:59 PM   Nasopharynge  Screenin  Result Value Ref Range Status   SARS-CoV-2, NAA 2 DAY TAT Performed  Final  SARS Coronavirus 2 by RT PCR (hospital order, performed in [facility] hospital lab) Nasopharyngeal Nasopharyngeal Swab     Status: None   Collection Time: 11/19/20 11:38 PM   Specimen: Nasopharyngeal Swab  Result Value Ref Range Status   SARS Coronavirus 2 NEGATIVE NEGATIVE Final    Comment: (NOTE) SARS-CoV-2 target nucleic acids are NOT DETECTED.  The SARS-CoV-2 RNA is generally detectable in upper and lower respiratory specimens during the acute phase of infection. The lowest concentration of SARS-CoV-2 viral copies this assay can detect is 250 copies / mL. A negative result does not preclude SARS-CoV-2 infection and should not be used as the sole basis for treatment or other patient management decisions.  A negative result may occur with improper specimen collection / handling, submission of specimen other than nasopharyngeal swab, presence of viral mutation(s) within the areas targeted by this assay, and inadequate number of viral copies (<250 copies / mL). A negative result must be combined with  clinical observations, patient history, and epidemiological information.  Fact Sheet for Patients:   BoilerBrush.com.cy  Fact Sheet for Healthcare Providers: https://pope.com/  This test is not yet approved or  cleared by the [place] FDA and has been authorized for detection and/or diagnosis of SARS-CoV-2 by FDA under an Emergency Use Authorization (EUA).  This EUA will remain in effect (meaning this test can be used) for the duration of the COVID-19 declaration under Section 564(b)(1) of the Act, 21 U.S.C. section 360bbb-3(b)(1), unless the authorization is terminated or revoked sooner.  Performed at [facility], [address] [place], [place] [address]   Blood culture (routine x 2)     Status: None (Preliminary result)   Collection Time: 11/20/20  1:33 AM   Specimen: Right Antecubital; Blood  Result Value Ref Range Status   Specimen Description RIGHT ANTECUBITAL  Final   Special Requests   Final    BOTTLES DRAWN AEROBIC AND ANAEROBIC Blood Culture adequate volume   Culture   Final    NO GROWTH 3 DAYS Performed at [facility], [address] [place], [place] [address]    Report Status PENDING  Incomplete  Culture, blood (routine x 2) Call MD if unable to obtain prior to antibiotics being given     Status: None (Preliminary result)   Collection Time: 11/20/20  7:52 AM   Specimen: BLOOD  Result Value Ref Range Status   Specimen Description BLOOD BLOOD RIGHT HAND  Final   Special Requests   Final    BOTTLES DRAWN AEROBIC AND ANAEROBIC Blood Culture results may not be optimal due to an inadequate volume of blood received in culture bottles   Culture   Final    NO GROWTH 3 DAYS Performed at [facility], [address], [place], [place] [address]    Report Status PENDING  Incomplete  Culture, blood (routine x 2) Call MD if unable to obtain prior to antibiotics being given     Status: None  (Preliminary result)   Collection Time: 11/20/20  7:52 AM   Specimen: BLOOD  Result Value Ref Range Status   Specimen Description BLOOD BLOOD LEFT HAND  Final   Special Requests   Final    BOTTLES DRAWN AEROBIC AND ANAEROBIC Blood Culture results may not be optimal due to an inadequate volume of blood received in culture bottles   Culture   Final    NO GROWTH 3 DAYS Performed at [facility], [address], [place], [place] [address]    Report Status PENDING  Incomplete  Resp Panel by RT-PCR (Flu A&B, Covid)     Status: None   Collection Time: 11/20/20 12:18 PM  Result Value Ref Range Status   SARS Coronavirus 2 by RT PCR NEGATIVE NEGATIVE Final    Comment: (NOTE) SARS-CoV-2 target nucleic acids are NOT DETECTED.  The SARS-CoV-2 RNA is generally detectable in upper respiratory specimens during the acute phase of infection. The lowest concentration of SARS-CoV-2 viral copies this assay can detect is 138 copies/mL. A negative result does not preclude SARS-Cov-2 infection and should not be used as the sole basis for treatment or other patient management decisions. A negative result may occur with  improper specimen collection/handling, submission of specimen other than nasopharyngeal swab, presence of viral mutation(s) within the areas targeted by this assay, and inadequate number of viral copies(<138 copies/mL). A negative result must be combined with clinical observations, patient history, and epidemiological information. The expected result is Negative.  Fact Sheet for Patients:  EntrepreneurPulse.com.au  Fact Sheet for Healthcare Providers:  IncredibleEmployment.be  This test is no t yet approved or cleared by the [place] FDA and  has been authorized for detection and/or diagnosis of SARS-CoV-2 by FDA under an Emergency Use Authorization (EUA). This EUA will remain  in effect (meaning  this test can be used) for the duration  of the COVID-19 declaration under Section 564(b)(1) of the Act, 21 U.S.C.section 360bbb-3(b)(1), unless the authorization is terminated  or revoked sooner.       Influenza A by PCR NEGATIVE NEGATIVE Final   Influenza B by PCR NEGATIVE NEGATIVE Final    Comment: (NOTE) The Xpert Xpress SARS-CoV-2/FLU/RSV plus assay is intended as an aid in the diagnosis of influenza from Nasopharyngeal swab specimens and should not be used as a sole basis for treatment. Nasal washings and aspirates are unacceptable for Xpert Xpress SARS-CoV-2/FLU/RSV testing.  Fact Sheet for Patients: EntrepreneurPulse.com.au  Fact Sheet for Healthcare Providers: IncredibleEmployment.be  This test is not yet approved or cleared by the [place] FDA and has been authorized for detection and/or diagnosis of SARS-CoV-2 by FDA under an Emergency Use Authorization (EUA). This EUA will remain in effect (meaning this test can be used) for the duration of the COVID-19 declaration under Section 564(b)(1) of the Act, 21 U.S.C. section 360bbb-3(b)(1), unless the authorization is terminated or revoked.  Performed at [facility], [address] [place], [place] [address]   Culture, sputum-assessment     Status: None   Collection Time: 11/21/20  2:15 PM   Specimen: Sputum  Result Value Ref Range Status   Specimen Description SPUTUM  Final   Special Requests NONE  Final   Sputum evaluation   Final    Sputum specimen not acceptable for testing.  Please recollect.   NOTIFIED ASHTON PETERS AT 1518 ON 11/21/20 BY SS Performed at [facility], [address] [place], [place] [address]    Report Status 11/21/2020 FINAL  Final         Radiology Studies: DG Swallowing Func-Speech Pathology  Result Date: 11/22/2020 Objective Swallowing Evaluation: Type of Study: MBS-Modified Barium Swallow Study  Patient Details Name: [name] MRN: [id number] Date of  Birth: 30-Jan-1941 Today's Date: 11/22/2020 Time: SLP Start Time (ACUTE ONLY): 1242 -SLP Stop Time (ACUTE ONLY): [id number] SLP Time Calculation (min) (ACUTE ONLY): 23 min Past Medical History: Past Medical History: Diagnosis Date . Anxiety  . Bronchitis  . Chronic combined systolic (congestive) and diastolic (congestive) heart failure ([place])   a. 02/2018 Echo: EF 45-50%, diff HK, triv AI, mod MR, mildly dil LA/RA, nl RV fxn, mod TR. PASP 40-85mmHg. Marland Kitchen Chronic kidney disease  . DJD (degenerative joint disease), cervical  . History of stress test   a. 01/2008 MV: EF 76%. Fair ex tol. No ischemia. . Hyperlipidemia  . Hypertension  . Hypothyroid  . LBBB (left bundle branch block)  . Leg pain   a. 01/2018 ABI's wnl. . Lumbar spondylosis  . Persistent atrial fibrillation ([place])   a. 01/2018 Event monitor: 45 runs of SVT, longest 14.5 sec. SVT felt to be Afib/flutter-->2% burden. Longest run of AF 4h 42m (CHA2DS2VASc = 5-->Xarelto); b. 03/2019 s/p DCCV; c. 04/2019 Repeat Amio load and DCCV. 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 BACK SURGERY  07/2019 . CARDIOVERSION N/A 03/26/2018  Procedure: CARDIOVERSION;  Surgeon: Nelva Bush, MD;  Location: [facility] ORS;  Service: Cardiovascular;  Laterality: N/A; . CARDIOVERSION N/A 04/24/2018  Procedure: CARDIOVERSION;  Surgeon: Minna Merritts, MD;  Location: [facility] ORS;  Service: Cardiovascular;  Laterality: N/A; . ELECTROPHYSIOLOGY STUDY N/A 02/27/2012  Procedure: ELECTROPHYSIOLOGY STUDY;  Surgeon: Evans Lance, MD;  Location: [facility] CATH LAB;  Service:  Cardiovascular;  Laterality: N/A; . EPS and ablation for SVT  5/13  slow pathway ablation by Dr Lovena Le . EYE SURGERY    surgery for detatched retina . NECK SURGERY  02/2020 . SUPRAVENTRICULAR TACHYCARDIA ABLATION N/A 02/27/2012  Procedure: SUPRAVENTRICULAR TACHYCARDIA ABLATION;  Surgeon: Evans Lance, MD;  Location: [facility] CATH LAB;   Service: Cardiovascular;  Laterality: N/A; HPI: Patient is a 80 y.o. female with PMHx of anxiety, bronchitis, CHF, CKD, cervical degenerative joint disease, HTN, lumbar spondylosis, and Afib admitted to ED d/t worsening dyspnea and respiratory distress. Patient recently diagnosed with PNA ~3 weeks ago and was hospitalized at that time as well. Current dx: multi-focal PNA. Upon admission, patient's placed on 14L of O2 via [place]/non-rebreather, which has decreaed to 6-7L via [place]. Per patient report, patient with back/neck surgery last May, which was described as ACDF surgery. Since surgery, she stated that she has had "bouts of swallowing difficulty" and "feeling food stuck in her throat". She is currently consuming a regular/thin liquid diet. SLP to complete BSE today.  Subjective: Reports swallowing difficulties since ACDF, per pt report occurred in May 2021 Assessment / Plan / Recommendation CHL IP CLINICAL IMPRESSIONS 11/22/2020 Clinical Impression Patient presents with mild pharyngeal dysphagia secondary to pharyngeal weakness, and age-related structural changes of the cervical spine which impact pharyngeal clearance of solids. Oral stage of swallowing is within normal limits, with adequate oral control, bolus formation, rotary mastication and anterior to posterior transfer. Swallow initiation is timely at the level of the valleculae. Pharyngeal stage is characterized by adequate tongue base retraction and hyolaryngeal excursion, and mildly reduced pharyngeal constriction which leads to retention of solids along the posterior pharyngeal wall approximately level of C3-4. There is also intermittent valleculae, pyriform sinus residue with solids vs liquids. Pt is typically able to clear or reduce residue with a variety of maneuvers, including dry swallow, liquid wash, or cough/hock to clear the pharynx. In some cases she was not completely able to clear residue, posing risk for aspiration, although none was observed  during this study. There is no laryngeal penetration. Several compensatory maneuvers were attempted including head turn R and L (not effective) and chin tuck, which reduced overall residue in the pharynx. Cervical hardware with fusion of C5-6 visible but did not appear to impede bolus flow/clearance. Amplitude and duration of cricopharyngeal opening is within functional limits. Recommend pt continue regular diet with thin liquids, with modification of chopped meats, using extra sauce/gravy to moisten dry foods. Pt should tuck chin with solids, swallow twice or alternate solids and liquids, and clear throat occasionally. Given pt's recurrent PNAs educated on role of oral care in reducing aspiration risk and encouraged pt to perform oral care before and after meals. SLP Visit Diagnosis Dysphagia, pharyngeal phase (R13.13) Attention and concentration deficit following -- Frontal lobe and executive function deficit following -- Impact on safety and function Moderate aspiration risk   CHL IP TREATMENT RECOMMENDATION 11/22/2020 Treatment Recommendations Therapy as outlined in treatment plan below   Prognosis 11/22/2020 Prognosis for Safe Diet Advancement Fair Barriers to Reach Goals Time post onset Barriers/Prognosis Comment -- CHL IP DIET RECOMMENDATION 11/22/2020 SLP Diet Recommendations Regular solids;Dysphagia 3 (Mech soft) solids;Thin liquid Liquid Administration via Cup Medication Administration Whole meds with liquid Compensations Follow solids with liquid;Clear throat intermittently;Chin tuck Postural Changes Seated upright at 90 degrees   CHL IP OTHER RECOMMENDATIONS 11/22/2020 Recommended Consults -- Oral Care Recommendations Oral care before and after PO Other Recommendations --   CHL IP FOLLOW UP  RECOMMENDATIONS 11/20/2020 Follow up Recommendations Other (comment)   CHL IP FREQUENCY AND DURATION 11/22/2020 Speech Therapy Frequency (ACUTE ONLY) min 2x/week Treatment Duration 2 weeks      CHL IP ORAL PHASE 11/22/2020  Oral Phase WFL Oral - Pudding Teaspoon -- Oral - Pudding Cup -- Oral - Honey Teaspoon -- Oral - Honey Cup -- Oral - Nectar Teaspoon -- Oral - Nectar Cup -- Oral - Nectar Straw -- Oral - Thin Teaspoon -- Oral - Thin Cup -- Oral - Thin Straw -- Oral - Puree -- Oral - Mech Soft -- Oral - Regular -- Oral - Multi-Consistency -- Oral - Pill -- Oral Phase - Comment --  CHL IP PHARYNGEAL PHASE 11/22/2020 Pharyngeal Phase Impaired Pharyngeal- Pudding Teaspoon -- Pharyngeal -- Pharyngeal- Pudding Cup -- Pharyngeal -- Pharyngeal- Honey Teaspoon -- Pharyngeal -- Pharyngeal- Honey Cup -- Pharyngeal -- Pharyngeal- Nectar Teaspoon -- Pharyngeal -- Pharyngeal- Nectar Cup [facility] Pharyngeal Material does not enter airway Pharyngeal- Nectar Straw -- Pharyngeal -- Pharyngeal- Thin Teaspoon -- Pharyngeal -- Pharyngeal- Thin Cup [facility] Pharyngeal Material does not enter airway Pharyngeal- Thin Straw -- Pharyngeal -- Pharyngeal- Puree Pharyngeal residue - valleculae;Pharyngeal residue - pyriform;Reduced pharyngeal peristalsis Pharyngeal Material does not enter airway Pharyngeal- Mechanical Soft Reduced pharyngeal peristalsis;Pharyngeal residue - valleculae;Pharyngeal residue - pyriform;Pharyngeal residue - posterior pharnyx Pharyngeal Material does not enter airway Pharyngeal- Regular -- Pharyngeal -- Pharyngeal- Multi-consistency -- Pharyngeal -- Pharyngeal- Pill -- Pharyngeal -- Pharyngeal Comment --  CHL IP CERVICAL ESOPHAGEAL PHASE 11/22/2020 Cervical Esophageal Phase WFL Pudding Teaspoon -- Pudding Cup -- Honey Teaspoon -- Honey Cup -- Nectar Teaspoon -- Nectar Cup -- Nectar Straw -- Thin Teaspoon -- Thin Cup -- Thin Straw -- Puree -- Mechanical Soft -- Regular -- Multi-consistency -- Pill -- Cervical Esophageal Comment -- Deneise Lever, MS, CCC-SLP Speech-Language Pathologist Aliene Altes 11/22/2020, 2:00 PM                   Scheduled Meds: . amiodarone  200 mg Oral Daily  . apixaban  5 mg Oral BID  . doxazosin  1 mg Oral QPM   . furosemide  20 mg Oral q AM  . guaiFENesin  600 mg Oral BID  . ipratropium-albuterol  3 mL Nebulization QID  . irbesartan  150 mg Oral Daily  . levothyroxine  137 mcg Oral Q0600  . methylPREDNISolone (SOLU-MEDROL) injection  40 mg Intravenous Q24H  . metoprolol succinate  50 mg Oral Q breakfast  . potassium chloride  10 mEq Oral Daily  . rosuvastatin  40 mg Oral Daily   Continuous Infusions: . sodium chloride 250 mL (11/23/20 0651)  . levofloxacin (LEVAQUIN) IV 750 mg (11/23/20 5465)          Aline August, MD [facility] 11/23/2020, 1:00 PM

## 2020-11-24 LAB — CBC WITH DIFFERENTIAL/PLATELET
Abs Immature Granulocytes: 0.09 10*3/uL — ABNORMAL HIGH (ref 0.00–0.07)
Basophils Absolute: 0 10*3/uL (ref 0.0–0.1)
Basophils Relative: 0 %
Eosinophils Absolute: 0.1 10*3/uL (ref 0.0–0.5)
Eosinophils Relative: 1 %
HCT: 29.5 % — ABNORMAL LOW (ref 36.0–46.0)
Hemoglobin: 9.6 g/dL — ABNORMAL LOW (ref 12.0–15.0)
Immature Granulocytes: 1 %
Lymphocytes Relative: 16 %
Lymphs Abs: 1.3 10*3/uL (ref 0.7–4.0)
MCH: 29.5 pg (ref 26.0–34.0)
MCHC: 32.5 g/dL (ref 30.0–36.0)
MCV: 90.8 fL (ref 80.0–100.0)
Monocytes Absolute: 0.7 10*3/uL (ref 0.1–1.0)
Monocytes Relative: 9 %
Neutro Abs: 5.7 10*3/uL (ref 1.7–7.7)
Neutrophils Relative %: 73 %
Platelets: 312 10*3/uL (ref 150–400)
RBC: 3.25 MIL/uL — ABNORMAL LOW (ref 3.87–5.11)
RDW: 13.9 % (ref 11.5–15.5)
WBC: 7.9 10*3/uL (ref 4.0–10.5)
nRBC: 0 % (ref 0.0–0.2)

## 2020-11-24 LAB — BASIC METABOLIC PANEL
Anion gap: 9 (ref 5–15)
BUN: 21 mg/dL (ref 8–23)
CO2: 28 mmol/L (ref 22–32)
Calcium: 8.4 mg/dL — ABNORMAL LOW (ref 8.9–10.3)
Chloride: 100 mmol/L (ref 98–111)
Creatinine, Ser: 0.94 mg/dL (ref 0.44–1.00)
GFR, Estimated: >60 mL/min (ref >60)
Glucose, Bld: 104 mg/dL — ABNORMAL HIGH (ref 70–99)
Potassium: 3.5 mmol/L (ref 3.5–5.1)
Sodium: 137 mmol/L (ref 135–145)

## 2020-11-24 LAB — MAGNESIUM: Magnesium: 2.5 mg/dL — ABNORMAL HIGH (ref 1.7–2.4)

## 2020-11-24 MED ORDER — IPRATROPIUM-ALBUTEROL 0.5-2.5 (3) MG/3ML IN SOLN: 3.0000 mL | RESPIRATORY_TRACT | Status: DC | PRN

## 2020-11-24 MED ORDER — LEVOFLOXACIN 250 MG PO TABS: 250.0000 mg | ORAL_TABLET | Freq: Every day | ORAL | 0 refills | Status: AC

## 2020-11-24 MED ORDER — IPRATROPIUM-ALBUTEROL 0.5-2.5 (3) MG/3ML IN SOLN: 3.0000 mL | Freq: Three times a day (TID) | RESPIRATORY_TRACT | Status: DC

## 2020-11-24 NOTE — Discharge Summary (Signed)
Physician Discharge Summary  AGATHA FORCUCCI [id number] DOB: 1941/01/31 DOA: 11/19/2020  PCP: Steele Sizer, MD  Admit date: 11/19/2020 Discharge date: 11/24/2020  Time spent: 45 minutes  Recommendations for Outpatient Follow-up:  1. PCP in 1 week, needs to follow strict aspiration precautions, needs follow-up chest x-ray in 6 weeks   Discharge Diagnoses:  Active Problems:   Acute respiratory failure with hypoxemia (HCC) Aspiration pneumonia Sepsis Chronic systolic CHF Paroxysmal atrial fibrillation Hypokalemia  Discharge Condition: Improved  Diet recommendation: Dysphagia 3 (mechanical soft diet), low-sodium  Filed Weights   11/23/20 0500 11/23/20 0840 11/24/20 0500  Weight: 58.2 kg 56.2 kg 56 kg    History of present illness:  80 year old female with history of hypertension, hyperlipidemia, hypothyroidism, paroxysmal atrial fibrillation, chronic systolic heart failure, recent admission and discharge on 11/02/2020 for pneumonia anxiety presented with worsening shortness of breath.  On presentation, she was hypoxic requiring supplemental oxygen with leukocytosis.  COVID-19 PCR was negative.  Chest x-ray showed multifocal pneumonia.  She was started on IV fluids and antibiotics.  Hospital Course:   Aspiration/multifocal pneumonia Sepsis, poa -she was admitted with dyspnea, hypoxia tachypnea leukocytosis and multifocal pneumonia on x-ray, CT chest was negative for PE but showed multifocal pneumonia -Treated with IV antibiotics, IV fluids -Covid and flu PCR were negative -Clinically improving now, weaned off oxygen -Due to multiple drug allergies she was treated with IV Levaquin, then transitioned to oral Levaquin for 2 more days to complete 7-day course -Also completed SLP evaluation, noted pharyngeal phase dysphagia, felt to be moderate aspiration risk, recommended dysphagia 3-mechanical soft diet with thin liquids and aspiration precautions -Needs follow-up chest x-ray in  6 weeks  Chronic systolic heart failure -Currently compensated.  Continue Diovan, Toprol-XL and Lasix.    Paroxysmal A. Fib -Continue Toprol-XL and Eliquis  Hypokalemia -Resolved  Anemia of chronic disease -Stable  Hypothyroidism -Continue Synthroid  Dyslipidemia -Continue statin  Essential hypertension -Continue Diovan and Toprol-XL  Generalized conditioning -PT recommended home health PT   Discharge Exam: Vitals:   11/24/20 0811 11/24/20 1056  BP: (!) 142/55 (!) 121/55  Pulse: 60 78  Resp: 18 15  Temp: 97.6 F (36.4 C) 97.8 F (36.6 C)  SpO2: 95% 95%    General: AAOx3 Cardiovascular: S1S2/RRR Respiratory: CTAB  Discharge Instructions   Discharge Instructions    Diet - low sodium heart healthy   Complete by: As directed    Increase activity slowly   Complete by: As directed      Allergies as of 11/24/2020      Reactions   Chocolate Swelling   Penicillins Swelling, Rash   Has patient had a PCN reaction causing immediate rash, facial/tongue/throat swelling, SOB or lightheadedness with hypotension: Yes Has patient had a PCN reaction causing severe rash involving mucus membranes or skin necrosis: No Has patient had a PCN reaction that required hospitalization: No Has patient had a PCN reaction occurring within the last 10 years: No If all of the above answers are "NO", then may proceed with Cephalosporin use.   Codeine Nausea And Vomiting   Erythromycin Itching, Swelling   Septra [sulfamethoxazole-trimethoprim] Swelling      Medication List    TAKE these medications   acetaminophen 650 MG CR tablet Commonly known as: TYLENOL Take 650-1,300 mg by mouth every 8 (eight) hours as needed for pain.   albuterol 108 (90 Base) MCG/ACT inhaler Commonly known as: VENTOLIN HFA Inhale 2 puffs into the lungs every 6 (six) hours as needed for wheezing or  shortness of breath.   amiodarone 200 MG tablet Commonly known as: PACERONE TAKE 1 TABLET(200 MG)  BY MOUTH DAILY   busPIRone 5 MG tablet Commonly known as: BUSPAR Take 1 tablet (5 mg total) by mouth 2 (two) times daily.   cholecalciferol 1000 units tablet Commonly known as: VITAMIN D Take 2,000 Units by mouth daily.   Cranberry 500 MG Tabs Take 2 tablets by mouth daily.   doxazosin 1 MG tablet Commonly known as: Cardura Take 1 tablet (1 mg total) by mouth every evening.   Eliquis 5 MG Tabs tablet Generic drug: apixaban TAKE 1 TABLET(5 MG) BY MOUTH TWICE DAILY   furosemide 20 MG tablet Commonly known as: LASIX Take 1 tablet (20 mg total) by mouth See admin instructions. Take 1 tablet (20mg ) by mouth every morning - take 1 additional tablet (20mg ) after lunch if needed for swelling   levofloxacin 250 MG tablet Commonly known as: Levaquin Take 1 tablet (250 mg total) by mouth daily for 2 days.   levothyroxine 137 MCG tablet Commonly known as: SYNTHROID TAKE 1 TABLET(137 MCG) BY MOUTH EVERY MORNING   metoprolol succinate 50 MG 24 hr tablet Commonly known as: TOPROL-XL Take with or immediately following a meal.   multivitamin with minerals Tabs tablet Take 1 tablet by mouth daily.   potassium chloride 10 MEQ tablet Commonly known as: KLOR-CON TAKE 1 TABLET BY MOUTH EVERY DAY AS DIRECTED. MAY TAKE AN EXTRA PILL AFTER LUNCH WITH FLUID PILL AS NEEDED FOR SWELLING   rosuvastatin 40 MG tablet Commonly known as: CRESTOR TAKE 1 TABLET(40 MG) BY MOUTH DAILY   temazepam 15 MG capsule Commonly known as: RESTORIL TAKE 1 CAPSULE(15 MG) BY MOUTH AT BEDTIME AS NEEDED FOR SLEEP   valsartan 160 MG tablet Commonly known as: Diovan Take 1 tablet (160 mg total) by mouth daily. Take in the morning      Allergies  Allergen Reactions  . Chocolate Swelling  . Penicillins Swelling and Rash    Has patient had a PCN reaction causing immediate rash, facial/tongue/throat swelling, SOB or lightheadedness with hypotension: Yes Has patient had a PCN reaction causing severe rash  involving mucus membranes or skin necrosis: No Has patient had a PCN reaction that required hospitalization: No Has patient had a PCN reaction occurring within the last 10 years: No If all of the above answers are "NO", then may proceed with Cephalosporin use.   . Codeine Nausea And Vomiting  . Erythromycin Itching and Swelling  . Septra [Sulfamethoxazole-Trimethoprim] Swelling    Follow-up Information    Steele Sizer, MD. Schedule an appointment as soon as possible for a visit in 1 week(s).   Specialty: Family Medicine Why: need Ct chest in 3-16months Contact information: [address] [place] [address] [phone]                The results of significant diagnostics from this hospitalization (including imaging, microbiology, ancillary and laboratory) are listed below for reference.    Significant Diagnostic Studies: DG Chest 2 View  Result Date: 11/18/2020 CLINICAL DATA:  Cough EXAM: CHEST - 2 VIEW COMPARISON:  October 30, 2020 FINDINGS: The cardiomediastinal silhouette is unchanged in contour.Decreased bilateral pleural effusions with a persistent small to moderate RIGHT pleural effusion and trace LEFT pleural effusion. Biapical RIGHT greater than LEFT pleuroparenchymal scarring, unchanged. No pneumothorax. Patchy bilateral heterogeneous opacities with a peripheral predominance, mildly increased in comparison to prior. Rounded lucency of the LEFT lung base. Visualized abdomen is unremarkable. Atherosclerotic  calcifications of the aorta. Degenerative changes at the thoracolumbar junction. IMPRESSION: 1. Patchy bilateral heterogeneous opacities with a peripheral predominance, mildly increased in comparison to prior, concerning for multifocal pneumonia. COVID-19 infection remains in the differential. 2. Decreased bilateral pleural effusions with a persistent small to moderate RIGHT pleural effusion and trace LEFT pleural effusion. 3. There is a rounded lucency at  the LEFT lung base which may reflect an area of cavitation versus focal air trapping. Recommend close attention on follow-up. Electronically Signed   By: Valentino Saxon MD   On: 11/18/2020 15:14   DG Chest 2 View  Result Date: 10/30/2020 CLINICAL DATA:  Shortness of breath, concern for COVID-19, fever, hypoxic on room air, tachypnea EXAM: CHEST - 2 VIEW COMPARISON:  Radiograph 04/22/2018 FINDINGS: Mixed patchy and consolidative and interstitial opacities are present in both lungs with a perihilar and basilar distribution. Gradient density towards both lung bases with obscuration of the hemidiaphragms likely reflect layering pleural effusions. No pneumothorax. Pulmonary vascularity is somewhat congested and indistinct. Cardiac silhouette is likely enlarged though not significantly changed from prior portable imaging. The aorta is calcified. The remaining cardiomediastinal contours are unremarkable. No acute osseous or soft tissue abnormality. Degenerative changes are present in the imaged spine and shoulders. Prior cervical fusion incompletely assessed on this exam. Telemetry leads overlie the chest. IMPRESSION: Mixed interstitial and patchy opacities in the lungs, while this certainly could be seen in the setting of infection, suspect some degree CHF/volume overload as well with cardiomegaly, effusions and features to suggest pulmonary edema as well. Electronically Signed   By: Lovena Le M.D.   On: 10/30/2020 05:40   CT CHEST WO CONTRAST  Result Date: 10/30/2020 CLINICAL DATA:  Abnormal x-ray. EXAM: CT CHEST WITHOUT CONTRAST TECHNIQUE: Multidetector CT imaging of the chest was performed following the standard protocol without IV contrast. COMPARISON:  Chest x-ray from earlier same day. FINDINGS: Cardiovascular: Cardiomegaly. No pericardial effusion. No thoracic aortic aneurysm. Aortic atherosclerosis. Scattered coronary artery calcifications. Mediastinum/Nodes: Mildly prominent lymph nodes within the  mediastinum and bilateral perihilar regions, likely reactive in nature. Esophagus is unremarkable. Small hiatal hernia. Trachea is unremarkable. Lungs/Pleura: Patchy bilateral ground-glass opacities and consolidations, with the ground glass opacities most prominent within the LEFT upper lobe and the consolidations most prominent at the bilateral lung bases. No pneumothorax. Small bilateral pleural effusions. Upper Abdomen: Limited images of the upper abdomen are unremarkable. Musculoskeletal: No acute or suspicious osseous abnormality. IMPRESSION: 1. Patchy bilateral ground-glass opacities and consolidations, most likely multifocal pneumonia. Differential for the ground-glass components includes atypical pneumonias such as viral or fungal, interstitial pneumonias, edema related to volume overload/CHF, chronic interstitial diseases, hypersensitivity pneumonitis, and respiratory bronchiolitis. [id number] pneumonia can certainly have this appearance. 2. Small bilateral pleural effusions. 3. Mildly prominent lymph nodes within the mediastinum and bilateral perihilar regions, likely reactive in nature. 4. Cardiomegaly. 5. Small hiatal hernia. Aortic Atherosclerosis (ICD10-I70.0). Electronically Signed   By: Franki Cabot M.D.   On: 10/30/2020 11:17   CT Angio Chest W/Cm &/Or Wo Cm  Result Date: 11/18/2020 CLINICAL DATA:  Shortness of breath EXAM: CT ANGIOGRAPHY CHEST WITH CONTRAST TECHNIQUE: Multidetector CT imaging of the chest was performed using the standard protocol during bolus administration of intravenous contrast. Multiplanar CT image reconstructions and MIPs were obtained to evaluate the vascular anatomy. CONTRAST:  156mL OMNIPAQUE IOHEXOL 300 MG/ML  SOLN COMPARISON:  October 30, 2020 FINDINGS: Cardiovascular: Satisfactory opacification of the pulmonary arteries to the segmental level. No evidence of pulmonary embolism. Cardiomegaly. No pericardial  effusion. Mild atherosclerotic calcifications of the thoracic  aorta. Mediastinum/Nodes: No suspicious thyroid nodule. Revisualization of prominent hilar lymph nodes. RIGHT hilar lymph node measuring 14 mm in the short axis, unchanged when accounting for differences in technique (series 6, image 127). Lymph node in the AP window measures 9 mm in the short axis, unchanged (series 6, image 103). Lungs/Pleura: Small RIGHT and trace LEFT pleural effusions. No pneumothorax. Mild interlobular septal thickening. Improved aeration of bilateral lung bases with decreased atelectasis. Diffuse peripheral predominant centrilobular consolidative and ground-glass opacities, overall increased in comparison to prior in the RIGHT upper lobe but mildly decreased in comparison to prior in the LEFT upper lobe. There are more focal irregular nodular opacities which are unchanged, including a RIGHT middle lobe pulmonary nodule which measures 6 mm (series 6, image 175). Rounded lucency at the LEFT lung base seen on radiograph corresponds to several areas of air trapping. Upper Abdomen: Small hiatal hernia. Musculoskeletal: No acute osseous abnormality. Review of the MIP images confirms the above findings. IMPRESSION: 1. No evidence of acute pulmonary embolism. 2. Diffuse peripheral predominant consolidative and ground-glass opacities, overall increased in comparison to prior in the RIGHT upper lobe but mildly decreased in the LEFT upper lobe. Improved aeration of bilateral lung bases with decreased atelectasis. Findings are favored to represent worsening multifocal pneumonia with COVID 19 infection remaining on the differential. 3. Small RIGHT and trace LEFT pleural effusions. Background of mild interlobular septal thickening may reflect a degree of pulmonary edema. 4. Revisualization of prominent hilar and mediastinal lymph nodes, likely reactive. 5. Rounded lucency at the LEFT lung base seen on radiograph corresponds to several areas of air trapping. 6. There are several more focal nodular opacities  which are indeterminate. Recommend follow-up chest CT in 3-6 months to assess for resolution. Aortic Atherosclerosis (ICD10-I70.0). Electronically Signed   By: Valentino Saxon MD   On: 11/18/2020 15:56   DG Swallowing Func-Speech Pathology  Result Date: 11/22/2020 Objective Swallowing Evaluation: Type of Study: MBS-Modified Barium Swallow Study  Patient Details Name: ADALICIA BOISSELLE MRN: [id number] Date of Birth: 1940-11-13 Today's Date: 11/22/2020 Time: SLP Start Time (ACUTE ONLY): 1242 -SLP Stop Time (ACUTE ONLY): [id number] SLP Time Calculation (min) (ACUTE ONLY): 23 min Past Medical History: Past Medical History: Diagnosis Date . Anxiety  . Bronchitis  . Chronic combined systolic (congestive) and diastolic (congestive) heart failure ([place])   a. 02/2018 Echo: EF 45-50%, diff HK, triv AI, mod MR, mildly dil LA/RA, nl RV fxn, mod TR. PASP 40-81mmHg. Marland Kitchen Chronic kidney disease  . DJD (degenerative joint disease), cervical  . History of stress test   a. 01/2008 MV: EF 76%. Fair ex tol. No ischemia. . Hyperlipidemia  . Hypertension  . Hypothyroid  . LBBB (left bundle branch block)  . Leg pain   a. 01/2018 ABI's wnl. . Lumbar spondylosis  . Persistent atrial fibrillation ([place])   a. 01/2018 Event monitor: 45 runs of SVT, longest 14.5 sec. SVT felt to be Afib/flutter-->2% burden. Longest run of AF 4h 14m (CHA2DS2VASc = 5-->Xarelto); b. 03/2019 s/p DCCV; c. 04/2019 Repeat Amio load and DCCV. 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 BACK SURGERY  07/2019 . CARDIOVERSION N/A 03/26/2018  Procedure: CARDIOVERSION;  Surgeon: Nelva Bush, MD;  Location: [facility] ORS;  Service: Cardiovascular;  Laterality: N/A; . CARDIOVERSION N/A 04/24/2018  Procedure: CARDIOVERSION;  Surgeon: Minna Merritts, MD;  Location: [facility]  ORS;  Service: Cardiovascular;  Laterality: N/A; . ELECTROPHYSIOLOGY STUDY N/A 02/27/2012  Procedure:  ELECTROPHYSIOLOGY STUDY;  Surgeon: Evans Lance, MD;  Location: [facility] CATH LAB;  Service: Cardiovascular;  Laterality: N/A; . EPS and ablation for SVT  5/13  slow pathway ablation by Dr Lovena Le . EYE SURGERY    surgery for detatched retina . NECK SURGERY  02/2020 . SUPRAVENTRICULAR TACHYCARDIA ABLATION N/A 02/27/2012  Procedure: SUPRAVENTRICULAR TACHYCARDIA ABLATION;  Surgeon: Evans Lance, MD;  Location: [facility] CATH LAB;  Service: Cardiovascular;  Laterality: N/A; HPI: Patient is a 80 y.o. female with PMHx of anxiety, bronchitis, CHF, CKD, cervical degenerative joint disease, HTN, lumbar spondylosis, and Afib admitted to ED d/t worsening dyspnea and respiratory distress. Patient recently diagnosed with PNA ~3 weeks ago and was hospitalized at that time as well. Current dx: multi-focal PNA. Upon admission, patient's placed on 14L of O2 via [place]/non-rebreather, which has decreaed to 6-7L via [place]. Per patient report, patient with back/neck surgery last May, which was described as ACDF surgery. Since surgery, she stated that she has had "bouts of swallowing difficulty" and "feeling food stuck in her throat". She is currently consuming a regular/thin liquid diet. SLP to complete BSE today.  Subjective: Reports swallowing difficulties since ACDF, per pt report occurred in May 2021 Assessment / Plan / Recommendation CHL IP CLINICAL IMPRESSIONS 11/22/2020 Clinical Impression Patient presents with mild pharyngeal dysphagia secondary to pharyngeal weakness, and age-related structural changes of the cervical spine which impact pharyngeal clearance of solids. Oral stage of swallowing is within normal limits, with adequate oral control, bolus formation, rotary mastication and anterior to posterior transfer. Swallow initiation is timely at the level of the valleculae. Pharyngeal stage is characterized by adequate tongue base retraction and hyolaryngeal excursion, and mildly reduced pharyngeal constriction which leads to retention of  solids along the posterior pharyngeal wall approximately level of C3-4. There is also intermittent valleculae, pyriform sinus residue with solids vs liquids. Pt is typically able to clear or reduce residue with a variety of maneuvers, including dry swallow, liquid wash, or cough/hock to clear the pharynx. In some cases she was not completely able to clear residue, posing risk for aspiration, although none was observed during this study. There is no laryngeal penetration. Several compensatory maneuvers were attempted including head turn R and L (not effective) and chin tuck, which reduced overall residue in the pharynx. Cervical hardware with fusion of C5-6 visible but did not appear to impede bolus flow/clearance. Amplitude and duration of cricopharyngeal opening is within functional limits. Recommend pt continue regular diet with thin liquids, with modification of chopped meats, using extra sauce/gravy to moisten dry foods. Pt should tuck chin with solids, swallow twice or alternate solids and liquids, and clear throat occasionally. Given pt's recurrent PNAs educated on role of oral care in reducing aspiration risk and encouraged pt to perform oral care before and after meals. SLP Visit Diagnosis Dysphagia, pharyngeal phase (R13.13) Attention and concentration deficit following -- Frontal lobe and executive function deficit following -- Impact on safety and function Moderate aspiration risk   CHL IP TREATMENT RECOMMENDATION 11/22/2020 Treatment Recommendations Therapy as outlined in treatment plan below   Prognosis 11/22/2020 Prognosis for Safe Diet Advancement Fair Barriers to Reach Goals Time post onset Barriers/Prognosis Comment -- CHL IP DIET RECOMMENDATION 11/22/2020 SLP Diet Recommendations Regular solids;Dysphagia 3 (Mech soft) solids;Thin liquid Liquid Administration via Cup Medication Administration Whole meds with liquid Compensations Follow solids with liquid;Clear throat intermittently;Chin tuck Postural  Changes Seated upright at  90 degrees   CHL IP OTHER RECOMMENDATIONS 11/22/2020 Recommended Consults -- Oral Care Recommendations Oral care before and after PO Other Recommendations --   CHL IP FOLLOW UP RECOMMENDATIONS 11/20/2020 Follow up Recommendations Other (comment)   CHL IP FREQUENCY AND DURATION 11/22/2020 Speech Therapy Frequency (ACUTE ONLY) min 2x/week Treatment Duration 2 weeks      CHL IP ORAL PHASE 11/22/2020 Oral Phase WFL Oral - Pudding Teaspoon -- Oral - Pudding Cup -- Oral - Honey Teaspoon -- Oral - Honey Cup -- Oral - Nectar Teaspoon -- Oral - Nectar Cup -- Oral - Nectar Straw -- Oral - Thin Teaspoon -- Oral - Thin Cup -- Oral - Thin Straw -- Oral - Puree -- Oral - Mech Soft -- Oral - Regular -- Oral - Multi-Consistency -- Oral - Pill -- Oral Phase - Comment --  CHL IP PHARYNGEAL PHASE 11/22/2020 Pharyngeal Phase Impaired Pharyngeal- Pudding Teaspoon -- Pharyngeal -- Pharyngeal- Pudding Cup -- Pharyngeal -- Pharyngeal- Honey Teaspoon -- Pharyngeal -- Pharyngeal- Honey Cup -- Pharyngeal -- Pharyngeal- Nectar Teaspoon -- Pharyngeal -- Pharyngeal- Nectar Cup [facility] Pharyngeal Material does not enter airway Pharyngeal- Nectar Straw -- Pharyngeal -- Pharyngeal- Thin Teaspoon -- Pharyngeal -- Pharyngeal- Thin Cup [facility] Pharyngeal Material does not enter airway Pharyngeal- Thin Straw -- Pharyngeal -- Pharyngeal- Puree Pharyngeal residue - valleculae;Pharyngeal residue - pyriform;Reduced pharyngeal peristalsis Pharyngeal Material does not enter airway Pharyngeal- Mechanical Soft Reduced pharyngeal peristalsis;Pharyngeal residue - valleculae;Pharyngeal residue - pyriform;Pharyngeal residue - posterior pharnyx Pharyngeal Material does not enter airway Pharyngeal- Regular -- Pharyngeal -- Pharyngeal- Multi-consistency -- Pharyngeal -- Pharyngeal- Pill -- Pharyngeal -- Pharyngeal Comment --  CHL IP CERVICAL ESOPHAGEAL PHASE 11/22/2020 Cervical Esophageal Phase WFL Pudding Teaspoon -- Pudding Cup -- Honey Teaspoon --  Honey Cup -- Nectar Teaspoon -- Nectar Cup -- Nectar Straw -- Thin Teaspoon -- Thin Cup -- Thin Straw -- Puree -- Mechanical Soft -- Regular -- Multi-consistency -- Pill -- Cervical Esophageal Comment -- Deneise Lever, MS, CCC-SLP Speech-Language Pathologist Aliene Altes 11/22/2020, 2:00 PM              ECHOCARDIOGRAM COMPLETE  Result Date: 10/31/2020    ECHOCARDIOGRAM REPORT   Patient Name:   MAKIBA BUTTERWORTH Date of Exam: 10/31/2020 Medical Rec #:  [id number]      Height:       66.0 in Accession #:    [id number]     Weight:       126.8 lb Date of Birth:  02/09/1941     BSA:          1.648 m Patient Age:    60 years       BP:           135/43 mmHg Patient Gender: F              HR:           79 bpm. Exam Location:  [facility] Procedure: 2D Echo Indications:     DYSPNEA R06.00  History:         Patient has prior history of Echocardiogram examinations, most                  recent 03/21/2018. Arrythmias:Atrial Fibrillation,                  Signs/Symptoms:Shortness of Breath; Risk Factors:Hypertension                  and Dyslipidemia.  Sonographer:  [facility] Referring Phys:  [id number] Loletha Grayer Diagnosing Phys: Ida Rogue MD IMPRESSIONS  1. Left ventricular ejection fraction, by estimation, is 60 to 65%. The left ventricle has normal function. The left ventricle has no regional wall motion abnormalities. Left ventricular diastolic parameters are consistent with Grade I diastolic dysfunction (impaired relaxation).  2. Right ventricular systolic function is normal. The right ventricular size is normal. There is moderately elevated pulmonary artery systolic pressure. The estimated right ventricular systolic pressure is [id number] mmHg.  3. The mitral valve is normal in structure. Mild to moderate mitral valve regurgitation. Two jets. No evidence of mitral stenosis. FINDINGS  Left Ventricle: Left ventricular ejection fraction, by estimation, is 60 to 65%. The left ventricle has normal function. The left ventricle  has no regional wall motion abnormalities. The left ventricular internal cavity size was normal in size. There is  no left ventricular hypertrophy. Left ventricular diastolic parameters are consistent with Grade I diastolic dysfunction (impaired relaxation). Right Ventricle: The right ventricular size is normal. No increase in right ventricular wall thickness. Right ventricular systolic function is normal. There is moderately elevated pulmonary artery systolic pressure. The tricuspid regurgitant velocity is 3.53 m/s, and with an assumed right atrial pressure of 5 mmHg, the estimated right ventricular systolic pressure is [id number] mmHg. Left Atrium: Left atrial size was normal in size. Right Atrium: Right atrial size was normal in size. Pericardium: There is no evidence of pericardial effusion. Mitral Valve: The mitral valve is normal in structure. Mild to moderate mitral valve regurgitation. No evidence of mitral valve stenosis. Tricuspid Valve: The tricuspid valve is normal in structure. Tricuspid valve regurgitation is mild . No evidence of tricuspid stenosis. Aortic Valve: The aortic valve is normal in structure. Aortic valve regurgitation is not visualized. Mild aortic valve sclerosis is present, with no evidence of aortic valve stenosis. Pulmonic Valve: The pulmonic valve was normal in structure. Pulmonic valve regurgitation is not visualized. No evidence of pulmonic stenosis. Aorta: The aortic root is normal in size and structure. Venous: The inferior vena cava is normal in size with greater than 50% respiratory variability, suggesting right atrial pressure of 3 mmHg. IAS/Shunts: No atrial level shunt detected by color flow Doppler.  LEFT VENTRICLE PLAX 2D LVIDd:         5.33 cm  Diastology LVIDs:         3.04 cm  LV e' medial:    5.00 cm/s LV PW:         0.76 cm  LV E/e' medial:  16.2 LV IVS:        0.95 cm  LV e' lateral:   5.33 cm/s LVOT diam:     2.00 cm  LV E/e' lateral: 15.2 LVOT Area:     3.14 cm  RIGHT  VENTRICLE             IVC RV S prime:     13.10 cm/s  IVC diam: 1.68 cm LEFT ATRIUM             Index       RIGHT ATRIUM           Index LA diam:        4.50 cm 2.73 cm/m  RA Area:     21.20 cm LA Vol (A2C):   79.5 ml 48.25 ml/m RA Volume:   62.30 ml  37.81 ml/m LA Vol (A4C):   53.7 ml 32.59 ml/m LA Biplane Vol: 66.0 ml 40.06 ml/m   AORTA Ao  Root diam: 2.70 cm MITRAL VALVE               TRICUSPID VALVE MV Area (PHT): 4.06 cm    TR Peak grad:   49.8 mmHg MV Decel Time: 187 msec    TR Vmax:        353.00 cm/s MV E velocity: 81.00 cm/s MV A velocity: 97.50 cm/s  SHUNTS MV E/A ratio:  0.83        Systemic Diam: 2.00 cm Ida Rogue MD Electronically signed by Ida Rogue MD Signature Date/Time: 10/31/2020/5:54:20 PM    Final     Microbiology: Recent Results (from the past 240 hour(s))  Novel Coronavirus, NAA ([facility])     Status: None   Collection Time: 11/18/20  5:59 PM   Specimen: Nasopharyngeal(NP) swabs in vial transport medium   Nasopharynge  Screenin  Result Value Ref Range Status   SARS-CoV-2, NAA Not Detected Not Detected Final    Comment: This nucleic acid amplification test was developed and its performance characteristics determined by [employer]. Nucleic acid amplification tests include RT-PCR and TMA. This test has not been FDA cleared or approved. This test has been authorized by FDA under an Emergency Use Authorization (EUA). This test is only authorized for the duration of time the declaration that circumstances exist justifying the authorization of the emergency use of in vitro diagnostic tests for detection of SARS-CoV-2 virus and/or diagnosis of COVID-19 infection under section 564(b)(1) of the Act, 21 U.S.C. 301SWF-0(X) (1), unless the authorization is terminated or revoked sooner. When diagnostic testing is negative, the possibility of a false negative result should be considered in the context of a patient's recent exposures and the presence of clinical  signs and symptoms consistent with COVID-19. An individual without symptoms of COVID-19 and who is not shedding SARS-CoV-2 virus wo uld expect to have a negative (not detected) result in this assay.   SARS-COV-2, NAA 2 DAY TAT     Status: None   Collection Time: 11/18/20  5:59 PM   Nasopharynge  Screenin  Result Value Ref Range Status   SARS-CoV-2, NAA 2 DAY TAT Performed  Final  SARS Coronavirus 2 by RT PCR (hospital order, performed in [place] hospital lab) Nasopharyngeal Nasopharyngeal Swab     Status: None   Collection Time: 11/19/20 11:38 PM   Specimen: Nasopharyngeal Swab  Result Value Ref Range Status   SARS Coronavirus 2 NEGATIVE NEGATIVE Final    Comment: (NOTE) SARS-CoV-2 target nucleic acids are NOT DETECTED.  The SARS-CoV-2 RNA is generally detectable in upper and lower respiratory specimens during the acute phase of infection. The lowest concentration of SARS-CoV-2 viral copies this assay can detect is 250 copies / mL. A negative result does not preclude SARS-CoV-2 infection and should not be used as the sole basis for treatment or other patient management decisions.  A negative result may occur with improper specimen collection / handling, submission of specimen other than nasopharyngeal swab, presence of viral mutation(s) within the areas targeted by this assay, and inadequate number of viral copies (<250 copies / mL). A negative result must be combined with clinical observations, patient history, and epidemiological information.  Fact Sheet for Patients:   StrictlyIdeas.no  Fact Sheet for Healthcare Providers: BankingDealers.co.za  This test is not yet approved or  cleared by the [place] FDA and has been authorized for detection and/or diagnosis of SARS-CoV-2 by FDA under an Emergency Use Authorization (EUA).  This EUA will remain in effect (meaning this  test can be used) for the duration of the COVID-19  declaration under Section 564(b)(1) of the Act, 21 U.S.C. section 360bbb-3(b)(1), unless the authorization is terminated or revoked sooner.  Performed at [facility], [address] [place], [place] [address]   Blood culture (routine x 2)     Status: None (Preliminary result)   Collection Time: 11/20/20  1:33 AM   Specimen: Right Antecubital; Blood  Result Value Ref Range Status   Specimen Description RIGHT ANTECUBITAL  Final   Special Requests   Final    BOTTLES DRAWN AEROBIC AND ANAEROBIC Blood Culture adequate volume   Culture   Final    NO GROWTH 4 DAYS Performed at [facility], [address], [place], [place] [address]    Report Status PENDING  Incomplete  Culture, blood (routine x 2) Call MD if unable to obtain prior to antibiotics being given     Status: None (Preliminary result)   Collection Time: 11/20/20  7:52 AM   Specimen: BLOOD  Result Value Ref Range Status   Specimen Description BLOOD BLOOD RIGHT HAND  Final   Special Requests   Final    BOTTLES DRAWN AEROBIC AND ANAEROBIC Blood Culture results may not be optimal due to an inadequate volume of blood received in culture bottles   Culture   Final    NO GROWTH 4 DAYS Performed at [facility], [address] [place], [place] [address]    Report Status PENDING  Incomplete  Culture, blood (routine x 2) Call MD if unable to obtain prior to antibiotics being given     Status: None (Preliminary result)   Collection Time: 11/20/20  7:52 AM   Specimen: BLOOD  Result Value Ref Range Status   Specimen Description BLOOD BLOOD LEFT HAND  Final   Special Requests   Final    BOTTLES DRAWN AEROBIC AND ANAEROBIC Blood Culture results may not be optimal due to an inadequate volume of blood received in culture bottles   Culture   Final    NO GROWTH 4 DAYS Performed at [facility], [address], [place], [place] [address]    Report Status PENDING  Incomplete  Resp Panel by RT-PCR  (Flu A&B, Covid)     Status: None   Collection Time: 11/20/20 12:18 PM  Result Value Ref Range Status   SARS Coronavirus 2 by RT PCR NEGATIVE NEGATIVE Final    Comment: (NOTE) SARS-CoV-2 target nucleic acids are NOT DETECTED.  The SARS-CoV-2 RNA is generally detectable in upper respiratory specimens during the acute phase of infection. The lowest concentration of SARS-CoV-2 viral copies this assay can detect is 138 copies/mL. A negative result does not preclude SARS-Cov-2 infection and should not be used as the sole basis for treatment or other patient management decisions. A negative result may occur with  improper specimen collection/handling, submission of specimen other than nasopharyngeal swab, presence of viral mutation(s) within the areas targeted by this assay, and inadequate number of viral copies(<138 copies/mL). A negative result must be combined with clinical observations, patient history, and epidemiological information. The expected result is Negative.  Fact Sheet for Patients:  EntrepreneurPulse.com.au  Fact Sheet for Healthcare Providers:  IncredibleEmployment.be  This test is no t yet approved or cleared by the [place] FDA and  has been authorized for detection and/or diagnosis of SARS-CoV-2 by FDA under an Emergency Use Authorization (EUA). This EUA will remain  in effect (meaning this test can be used) for the  duration of the COVID-19 declaration under Section 564(b)(1) of the Act, 21 U.S.C.section 360bbb-3(b)(1), unless the authorization is terminated  or revoked sooner.       Influenza A by PCR NEGATIVE NEGATIVE Final   Influenza B by PCR NEGATIVE NEGATIVE Final    Comment: (NOTE) The Xpert Xpress SARS-CoV-2/FLU/RSV plus assay is intended as an aid in the diagnosis of influenza from Nasopharyngeal swab specimens and should not be used as a sole basis for treatment. Nasal washings and aspirates are unacceptable for  Xpert Xpress SARS-CoV-2/FLU/RSV testing.  Fact Sheet for Patients: EntrepreneurPulse.com.au  Fact Sheet for Healthcare Providers: IncredibleEmployment.be  This test is not yet approved or cleared by the [place] FDA and has been authorized for detection and/or diagnosis of SARS-CoV-2 by FDA under an Emergency Use Authorization (EUA). This EUA will remain in effect (meaning this test can be used) for the duration of the COVID-19 declaration under Section 564(b)(1) of the Act, 21 U.S.C. section 360bbb-3(b)(1), unless the authorization is terminated or revoked.  Performed at [facility], [address] [place], [place] [address]   Culture, sputum-assessment     Status: None   Collection Time: 11/21/20  2:15 PM   Specimen: Sputum  Result Value Ref Range Status   Specimen Description SPUTUM  Final   Special Requests NONE  Final   Sputum evaluation   Final    Sputum specimen not acceptable for testing.  Please recollect.   NOTIFIED ASHTON PETERS AT 1518 ON 11/21/20 BY SS Performed at [facility], [address] [place], [place] [address]    Report Status 11/21/2020 FINAL  Final     Labs: Basic Metabolic Panel: Recent Labs  Lab 11/18/20 0957 11/19/20 2338 11/20/20 0450 11/21/20 0535 11/24/20 0458  NA 138 137 137 142 137  K 3.5 3.6 3.8 3.3* 3.5  CL 103 101 104 107 100  CO2 23 25 22 25 28   GLUCOSE 177* 155* 175* 169* 104*  BUN 18 23 21 21 21   CREATININE 1.06* 1.00 0.83 0.83 0.94  CALCIUM 8.8* 8.8* 8.3* 8.7* 8.4*  MG  --   --   --  2.3 2.5*   Liver Function Tests: Recent Labs  Lab 11/18/20 0957 11/19/20 2338 11/21/20 0535  AST 25 38 26  ALT 18 29 21   ALKPHOS 80 86 64  BILITOT 0.6 0.7 0.5  PROT 7.4 7.5 6.1*  ALBUMIN 3.3* 3.0* 2.6*   No results for input(s): LIPASE, AMYLASE in the last 168 hours. No results for input(s): AMMONIA in the last 168 hours. CBC: Recent Labs  Lab 11/18/20 0957  11/19/20 2338 11/20/20 0450 11/21/20 0535 11/24/20 0458  WBC 10.5 12.8* 13.8* 13.8* 7.9  NEUTROABS 8.7* 11.3*  --  12.4* 5.7  HGB 10.5* 10.6* 9.0* 9.4* 9.6*  HCT 32.1* 32.0* 27.7* 27.5* 29.5*  MCV 93.9 92.8 93.6 90.8 90.8  PLT 255 260 234 243 312   Cardiac Enzymes: No results for input(s): CKTOTAL, CKMB, CKMBINDEX, TROPONINI in the last 168 hours. BNP: BNP (last 3 results) Recent Labs    10/30/20 0403 11/18/20 0957 11/19/20 2338  BNP 179.6* 248.6* 207.8*    ProBNP (last 3 results) No results for input(s): PROBNP in the last 8760 hours.  CBG: No results for input(s): GLUCAP in the last 168 hours.     Signed:  Domenic Polite MD.  [facility] 11/24/2020, 12:25 PM

## 2020-11-24 NOTE — TOC Transition Note (Addendum)
Transition of Care [facility]) - CM/SW Discharge Note   Patient Details  Name: [name] MRN: [id number] Date of Birth: Jun 26, 1941  [facility] CM/SW Contact:  Magnus Ivan, LCSW Phone Number: 11/24/2020, 1:54 PM   Clinical Narrative:   Patient discharged home today. Refused HH. Per LPN no home o2 needed at time of DC. TOC signing off.    Final next level of care: Home/Self Care Barriers to Discharge: Barriers Resolved   Patient Goals and CMS Choice Patient states their goals for this hospitalization and ongoing recovery are:: home with self care CMS Medicare.gov Compare Post Acute Care list provided to:: Patient Choice offered to / list presented to : Patient  Discharge Placement                    Patient and family notified of of transfer: 11/24/20  Discharge Plan and Services                                     Social Determinants of Health (SDOH) Interventions     Readmission Risk Interventions Readmission Risk Prevention Plan 11/22/2020  Transportation Screening Complete  PCP or Specialist Appt within 3-5 Days Complete  HRI or [place] Complete  Social Work Consult for [place] Planning/Counseling Complete  Palliative Care Screening Not Applicable  Medication Review Press photographer) Complete  Some recent data might be hidden

## 2020-11-24 NOTE — Discharge Instructions (Signed)
Community-Acquired Pneumonia, Adult Pneumonia is a lung infection that causes inflammation and the buildup of mucus and fluids in the lungs. This may cause coughing and difficulty breathing. Community-acquired pneumonia is pneumonia that develops in people who are not, and have not recently been, in a hospital or other health care facility. Usually, pneumonia develops as a result of an illness that is caused by a virus, such as the common cold and the flu (influenza). It can also be caused by bacteria or fungi. While the common cold and influenza can pass from person to person (are contagious), pneumonia itself is not considered contagious. What are the causes? This condition may be caused by:  Viruses.  Bacteria.  Fungi, such as molds or mushrooms.   What increases the risk? The following factors may make you more likely to develop this condition:  Having certain medical conditions, such as: ? A long-term (chronic) disease, which may include chronic obstructive pulmonary disease (COPD), asthma, heart failure, cystic fibrosis, diabetes, kidney disease, sickle cell disease, and human immunodeficiency virus (HIV). ? A condition that increases the risk of breathing in (aspirating) mucus and other fluids from your mouth and nose. ? A weakened body defense system (immune system).  Having had your spleen removed (splenectomy). The spleen is the organ that helps fight germs and infections.  Not cleaning your teeth and gums well (poor dental hygiene).  Using tobacco products.  Traveling to places where germs that cause pneumonia are present.  Being near certain animals, or animal habitats, that have germs that cause pneumonia.  Being older than 79 years of age. What are the signs or symptoms? Symptoms of this condition include:  A dry cough or a wet (productive) cough.  A fever.  Sweating or chills.  Chest pain, especially when breathing deeply or coughing.  Fast breathing,  difficulty breathing, or shortness of breath.  Tiredness (fatigue).  Muscle aches. How is this diagnosed? This condition may be diagnosed based on your medical history or a physical exam. You may also have tests, including:  Chest X-rays.  Tests of the level of oxygen and other gases in your blood.  Tests of: ? Your blood. ? Mucus from your lungs (sputum). ? Fluid around your lungs (pleural fluid). ? Your urine. If your pneumonia is severe, other tests may be done to learn more about the cause.   How is this treated? Treatment for this condition depends on many factors, such as the cause of your pneumonia, your medicines, and other medical conditions that you have. For most adults, pneumonia may be treated at home. In some cases, treatment must happen in a hospital and may include:  Medicines that are given by mouth (orally) or through an IV, including: ? Antibiotic medicines, if bacteria caused the pneumonia. ? Medicines that kill viruses (antiviral medicines), if a virus caused the pneumonia.  Oxygen therapy. Severe pneumonia, although rare, may require the following treatments:  Mechanical ventilation.This procedure uses a machine to help you breathe if you cannot breathe well on your own or maintain a safe level of blood oxygen.  Thoracentesis. This procedure removes any buildup of pleural fluid to help with breathing. Follow these instructions at home: Medicines  Take over-the-counter and prescription medicines only as told by your health care provider.  Take cough medicine only if you have trouble sleeping. Cough medicine can prevent your body from removing mucus from your lungs.  If you were prescribed an antibiotic medicine, take it as told by your health care   provider. Do not stop taking the antibiotic even if you start to feel better. Lifestyle  Do not drink alcohol.  Do not use any products that contain nicotine or tobacco, such as cigarettes, e-cigarettes, and  chewing tobacco. If you need help quitting, ask your health care provider.  Eat a healthy diet. This includes plenty of vegetables, fruits, whole grains, low-fat dairy products, and lean protein.      General instructions  Rest a lot and get at least 8 hours of sleep each night.  Sleep in a partly upright position at night. Place a few pillows under your head or sleep in a reclining chair.  Return to your normal activities as told by your health care provider. Ask your health care provider what activities are safe for you.  Drink enough fluid to keep your urine pale yellow. This helps to thin the mucus in your lungs.  If your throat is sore, gargle with a salt-water mixture 3-4 times a day or as needed. To make a salt-water mixture, completely dissolve -1 tsp (3-6 g) of salt in 1 cup (237 mL) of warm water.  Keep all follow-up visits as told by your health care provider. This is important.   How is this prevented? You can lower your risk of developing community-acquired pneumonia by:  Getting the pneumonia vaccine. There are different types and schedules of pneumonia vaccines. Ask your health care provider which option is best for you. Consider getting the pneumonia vaccine if: ? You are older than 79 years of age. ? You are 80-39 years of age and are receiving cancer treatment, have chronic lung disease, or have other medical conditions that affect your immune system. Ask your health care provider if this applies to you.  Getting your influenza vaccine every year. Ask your health care provider which type of vaccine is best for you.  Getting regular dental checkups.  Washing your hands often with soap and water for at least 20 seconds. If soap and water are not available, use hand sanitizer. Contact a health care provider if you have:  A fever.  Trouble sleeping because you cannot control your cough with cough medicine. Get help right away if:  Your shortness of breath becomes  worse.  Your chest pain increases.  Your sickness becomes worse, especially if you are an older adult or have a weak immune system.  You cough up blood. These symptoms may represent a serious problem that is an emergency. Do not wait to see if the symptoms will go away. Get medical help right away. Call your local emergency services (911 in the [place] Do not drive yourself to the hospital. Summary  Pneumonia is an infection of the lungs.  Community-acquired pneumonia develops in people who have not been in the hospital. It can be caused by bacteria, viruses, or fungi.  This condition may be treated with antibiotics or antiviral medicines.  Severe pneumonia may require a hospital stay and treatment to help with breathing. This information is not intended to replace advice given to you by your health care provider. Make sure you discuss any questions you have with your health care provider. Document Revised: 07/22/2019 Document Reviewed: 07/22/2019 Elsevier Patient Education  2021 [employer]   Acute Respiratory Failure, Adult Acute respiratory failure is a condition that is a medical emergency. It can develop quickly, and it should be treated right away. There are two types of acute respiratory failure:  Type I respiratory failure is when the lungs are  not able to get enough oxygen into the blood. This causes the blood oxygen level to drop.  Type II respiratory failure is when carbon dioxide is not passing from the lungs out of the body. This causes carbon dioxide to build up in the blood. A person may have one type of acute respiratory failure or have both types at the same time. What are the causes? Common causes of type I respiratory failure include:  Trauma to the lung, chest, ribs, or tissues around the lung.  Pneumonia.  Lung diseases, such as pulmonary fibrosis or asthma.  Smoke, chemical, or water inhalation.  A blood clot in the lungs (pulmonary embolism).  A  blood infection (sepsis).  Heart attack. Common causes of type II respiratory failure include:  Stroke.  A spinal cord injury.  A drug or alcohol overdose.  A blood infection (sepsis).  Cardiac arrest. What increases the risk? This condition is more likely to develop in people who have:  Lung diseases such as asthma or chronic obstructive pulmonary disease (COPD).  A condition that damages or weakens the muscles, nerves, bones, or tissues that are involved in breathing, such as myasthenia gravis or Guillain-Barr syndrome.  A serious infection.  A health problem that blocks the unconscious reflex that is involved in breathing, such as hypothyroidism or sleep apnea. What are the signs or symptoms? Trouble breathing is the main symptom of acute respiratory failure. Symptoms may also include:  Fast breathing.  Restlessness or anxiety.  Breathing loudly (wheezing) and grunting.  Fast or irregular heartbeats (palpitations).  Confusion or changes in behavior.  Feeling tired (fatigue), sleeping more than normal, or being hard to wake.  Skin, lips, or fingernails that appear blue (cyanosis). How is this diagnosed? This condition may be diagnosed based on:  Your medical history and a physical exam. Your health care provider will listen to your heart and lungs to check for abnormal sounds.  Tests to confirm the diagnosis and determine the cause of respiratory failure. These tests may include: ? Measuring the amount of oxygen in your blood (pulse oximetry). The measurement comes from a small device that is placed on your finger, earlobe, or toe. ? Blood tests to measure blood oxygen and carbon dioxide and to look for signs of infection. ? Tests on a sample of the fluid that surrounds the spinal cord (cerebrospinal fluid) or a sample of fluid that is drawn from the windpipe (trachea) to check for infections. ? Chest X-ray. ? Electrocardiogram (ECG) to look at the heart's  electrical activity.   How is this treated? Treatment for this condition usually takes place in a hospital intensive care unit (ICU). Treatment depends on what is causing the condition. It may include one or more of these treatments:  Oxygen may be given through your nose or a face mask.  A device such as a continuous positive airway pressure (CPAP) machine or bi-level positive airway pressure (BPAP) machine may be used to help you breathe. The device gives you oxygen and pressure.  Breathing treatments, fluids, and other medicines may be given.  A ventilator may be used to help you breathe. The machine gives you oxygen and pressure. A tube is put into your mouth and trachea to connect the ventilator. ? If this treatment is needed longer term, a tracheostomy may be placed. A tracheostomy is a breathing tube put through your neck into your trachea.  In extreme cases, extracorporeal life support (ECLS) may be used. This treatment temporarily takes over  the function of the heart and lungs, supplying oxygen and removing carbon dioxide. ECLS gives the lungs a chance to recover. Follow these instructions at home: Medicines  Take over-the-counter and prescription medicines only as told by your health care provider.  If you were prescribed an antibiotic medicine, take it as told by your health care provider. Do not stop using the antibiotic even if you start to feel better.  If you are taking blood thinners: ? Talk with your health care provider before you take any medicines that contain aspirin or NSAIDs, such as ibuprofen. These medicines increase your risk for dangerous bleeding. ? Take your medicine exactly as told, at the same time every day. ? Avoid activities that could cause injury or bruising, and follow instructions about how to prevent falls. ? Wear a medical alert bracelet or carry a card that lists what medicines you take. General instructions  Return to your normal activities as  told by your health care provider. Ask your health care provider what activities are safe for you.  Do not use any products that contain nicotine or tobacco, such as cigarettes, e-cigarettes, and chewing tobacco. If you need help quitting, ask your health care provider.  Do not drink alcohol if: ? Your health care provider tells you not to drink. ? You are pregnant, may be pregnant, or are planning to become pregnant.  Wear compression stockings as told by your health care provider. These stockings help to prevent blood clots and reduce swelling in your legs.  Attend any physical therapy and pulmonary rehabilitation as told by your health care provider.  Keep all follow-up visits as told by your health care provider. This is important. How is this prevented?  If you have an infection or a medical condition that may lead to acute respiratory failure, make sure you get proper treatment. Contact a health care provider if:  You have a fever.  Your symptoms do not improve or they get worse. Get help right away if:  You are having trouble breathing.  You lose consciousness.  You develop a fast heart rate.  Your fingers, lips, or other areas turn blue.  You are confused. These symptoms may represent a serious problem that is an emergency. Do not wait to see if the symptoms will go away. Get medical help right away. Call your local emergency services (911 in the [place] Do not drive yourself to the hospital. Summary  Acute respiratory failure is a medical emergency. It can develop quickly, and it should be treated right away.  Treatment for this condition usually takes place in a hospital intensive care unit (ICU). Treatment may include oxygen, fluids, and medicines. A device may be used to help you breathe, such as a ventilator.  Take over-the-counter and prescription medicines only as told by your health care provider.  Contact a health care provider if your symptoms do not improve  or if they get worse. This information is not intended to replace advice given to you by your health care provider. Make sure you discuss any questions you have with your health care provider. Document Revised: 09/26/2019 Document Reviewed: 09/26/2019 Elsevier Patient Education  2021 Else[employer] Inc.   How to Use a Nebulizer, Adult  A nebulizer is a device that turns liquid medicine into a mist or vapor that you can breathe in (inhale). This medicine helps to open the air passages in your lungs. You may need to use a nebulizer if you have an acute breathing illness, such  as pneumonia. A nebulizer may also be used to treat chronic conditions, such as asthma or chronic obstructive pulmonarydisease (COPD). There are different kinds of nebulizers. With some nebulizers, you breathe in medicine through a mouthpiece. With others, you get medicine through a mask that fits over your nose and mouth. What are the risks? If you use a nebulizer that does not fit right or is not cleaned properly, it can cause some problems, including:  Infection.  Eye irritation.  Delivery of too much medicine or not enough medicine.  Mouth irritation. Supplies needed:  Air compressor (nebulizer machine).  Nebulizer medicine cup (reservoir)and tubing.  Mouthpiece or face mask.  Soap and water.  Sterile or distilled water.  Clean towel. How to use a nebulizer Preparing a nebulizer Take these steps before using your nebulizer: 1. Read the manufacturer's instructions for your nebulizer, as machines vary. 2. Check your medicine. Make sure it has not expired and is not damaged in any way. 3. Wash your hands with soap and water. 4. Put all of the parts of your nebulizer on a sturdy, flat surface. 5. Connect the tubing to the nebulizer machine and to the reservoir. 6. Measure the liquid medicine according to instructions from your health care provider. Pour the liquid into the reservoir. 7. Attach the mouthpiece  or mask. 8. Test the nebulizer by turning it on to make sure that a spray comes out. Then, turn it off. Using a nebulizer Be sure to stop the machine at any time if you start coughing or if the medicine foams or bubbles. 1. Sit in an upright, relaxed position. 2. If your nebulizer has a mask, put it over your nose and mouth. It should fit somewhat snugly, with no gaps around the nose or cheeks where medicine could escape. If you use a mouthpiece, put it in your mouth. Press your lips firmly around the mouthpiece. 3. Turn on the nebulizer. 4. Some nebulizers have a finger valve. If yours does, cover up the air hole so the air gets to the nebulizer. 5. Once the medicine begins to mist out, take slow, deep breaths. If there is a finger valve, release it at the end of your breath. 6. Continue taking slow, deep breaths until the medicine in the nebulizer is gone and no mist appears.      Cleaning a nebulizer The nebulizer and all of its parts must be kept very clean. If the nebulizer and its parts are not cleaned properly, bacteria can grow inside of them. If you inhale the bacteria, you can get sick. Follow the manufacturer's instructions for cleaning your nebulizer. For most nebulizers, you should follow these guidelines:  Clean the mouthpiece or mask and the reservoir by: ? Rinsing them after each use. Use sterile or distilled water. ? Washing them 1-2 times a week using soap and warm water.  Do not wash the tubing.  After you rinse or wash them, place the parts on a clean towel and let them air-dry completely. After they dry, reconnect the pieces and turn the nebulizer on without any medicine in it. Doing this will blow air through the equipment to help dry it out.  Store the nebulizer in a clean and dust-free place.  Check the filter at least one time every week. Replace the filter if it looks dirty. Follow these instructions at home  Use your nebulizer only as told by your health care  provider. Do not use the nebulizer more than directed by your health  care provider.  Do not use any products that contain nicotine or tobacco, such as cigarettes, e-cigarettes, and chewing tobacco. If you need help quitting, ask your health care provider.  Keep all follow-up visits as told by your health care provider. This is important. Where to find more information  Allergy & [employer] Network: allergyasthmanetwork.org  American Lung Association: www.lung.org Contact a health care provider if:  You have trouble using the nebulizer.  Your nebulizer foams or stops working.  Your nebulizer does not create a mist after you add medicine and turn it on. Get help right away if:  You continue to have trouble breathing.  Your breathing gets worse during a nebulizer treatment. These symptoms may represent a serious problem that is an emergency. Do not wait to see if the symptoms will go away. Get medical help right away. Call your local emergency services (911 in the [place] Do not drive yourself to the hospital. Summary  A nebulizer is a device that turns liquid medicine into a mist (vapor) that you can breathe in (inhale).  Measure the liquid medicine according to instructions from your health care provider. Pour the liquid into the part of the nebulizer that holds the medicine (reservoir).  Once the medicine begins to mist out, take slow, deep breaths.  Rinse or wash the mouthpiece or mask and the reservoir after each use, and allow them to air-dry completely. This information is not intended to replace advice given to you by your health care provider. Make sure you discuss any questions you have with your health care provider. Document Revised: 06/21/2020 Document Reviewed: 11/19/2019 Elsevier Patient Education  2021 [employer].

## 2020-11-24 NOTE — Progress Notes (Signed)
Discharge Note: Reviewed discharge instructions with pt. Pt verbalized understanding. IV cath intact upon removal. Pt discharged to home with personal belongings. Staff wheeled pt out. PT transported from hospital to home via private transportation.

## 2020-11-24 NOTE — Progress Notes (Signed)
Physical Therapy Treatment Patient Details Name: [name] MRN: [id number] DOB: [birth year] Today's Date: 11/24/2020    History of Present Illness Pt is a 80 y.o. female presenting to hospital 1/29 with SOB.  Pt admitted with bacterial multifocal PNA, sepsis d/t PNA, and hypothyroidism  PMH includes anxiety, chronic combined systolic and diastolic congestive heart failure, CKD, htn, LBBB, leg pain, persistant a-fib, SVT, mitral valve insufficiency, pulmonary htn, PAD, h/o back sx, h/o cardioversion, h/o neck surgery, h/o SVT ablation.    PT Comments    Pt is making good progress towards goals with ability to ambulate in hallway around RN station on RA with sats ranging from 89-91%, with exertion. No SOB symptoms. Pt still reports she feels slightly weak, not quite at baseline level. Will continue to progress as able. Pt is hopeful for home discharge this date.   Follow Up Recommendations  Home health PT     Equipment Recommendations  None recommended by PT    Recommendations for Other Services       Precautions / Restrictions Precautions Precautions: Fall Restrictions Weight Bearing Restrictions: No    Mobility  Bed Mobility Overal bed mobility: Independent             General bed mobility comments: safe technique  Transfers Overall transfer level: Independent Equipment used: None Transfers: Sit to/from Stand Sit to Stand: Independent         General transfer comment: safe technique. Once standing, reaches for RW  Ambulation/Gait Ambulation/Gait assistance: Supervision Gait Distance (Feet): 200 Feet Assistive device: Rolling walker (2 wheeled) Gait Pattern/deviations: Step-through pattern;Narrow base of support     General Gait Details: reciprocal gait pattern with upright posture. Slow speed. All mobility performed on RA with sats ranging from 89-91% with exertion. No SOB symptoms noted   Stairs             Wheelchair Mobility    Modified  Rankin (Stroke Patients Only)       Balance Overall balance assessment: Modified Independent                                          Cognition Arousal/Alertness: Awake/alert Behavior During Therapy: WFL for tasks assessed/performed Overall Cognitive Status: Within Functional Limits for tasks assessed                                        Exercises      General Comments        Pertinent Vitals/Pain Pain Assessment: No/denies pain    Home Living                      Prior Function            PT Goals (current goals can now be found in the care plan section) Acute Rehab PT Goals Patient Stated Goal: to go home PT Goal Formulation: With patient Time For Goal Achievement: 12/05/20 Potential to Achieve Goals: Good Progress towards PT goals: Progressing toward goals    Frequency    Min 2X/week      PT Plan Current plan remains appropriate    Co-evaluation              AM-PAC PT "6 Clicks" Mobility   Outcome Measure  Help needed turning  from your back to your side while in a flat bed without using bedrails?: None Help needed moving from lying on your back to sitting on the side of a flat bed without using bedrails?: None Help needed moving to and from a bed to a chair (including a wheelchair)?: None Help needed standing up from a chair using your arms (e.g., wheelchair or bedside chair)?: None Help needed to walk in hospital room?: None Help needed climbing 3-5 steps with a railing? : None 6 Click Score: 24    End of Session Equipment Utilized During Treatment: Gait belt Activity Tolerance: Patient tolerated treatment well Patient left: in bed;with call bell/phone within reach;with family/visitor present Nurse Communication: Mobility status PT Visit Diagnosis: Other abnormalities of gait and mobility (R26.89);Muscle weakness (generalized) (M62.81);Difficulty in walking, not elsewhere classified (R26.2)      Time: 0951-1000 PT Time Calculation (min) (ACUTE ONLY): 9 min  Charges:  $Gait Training: 8-22 mins                     Greggory Stallion, PT, DPT [phone]    [name] 11/24/2020, 10:42 AM

## 2020-11-25 ENCOUNTER — Telehealth: Payer: Self-pay

## 2020-11-25 LAB — CULTURE, BLOOD (ROUTINE X 2)
Culture: NO GROWTH
Culture: NO GROWTH
Culture: NO GROWTH
Special Requests: ADEQUATE

## 2020-11-25 NOTE — Telephone Encounter (Signed)
Transition Care Management Follow-up Telephone Call  Date of discharge and from where: 11/24/20 [facility]  How have you been since you were released from the hospital? Pt states she is feeling better but still feels weak. Pt resting and has daughter at home for help if needed.   Any questions or concerns? No  Items Reviewed:  Did the pt receive and understand the discharge instructions provided? Yes   Medications obtained and verified? Yes   Other? No   Any new allergies since your discharge? No   Dietary orders reviewed? Yes  Do you have support at home? Yes   Home Care and Equipment/Supplies: Were home health services ordered? no Were any new equipment or medical supplies ordered?  No  Functional Questionnaire: (I = Independent and D = Dependent) ADLs: I  Bathing/Dressing- I  Meal Prep- I  Eating- I  Maintaining continence- I  Transferring/Ambulation- I  Managing Meds- I  Follow up appointments reviewed:   PCP Hospital f/u appt confirmed? No  PCP shows no availability for hospital follow up. Please advise for scheduling.   Are transportation arrangements needed? No   If their condition worsens, is the pt aware to call PCP or go to the Emergency Dept.? Yes  Was the patient provided with contact information for the PCP's office or ED? Yes  Was to pt encouraged to call back with questions or concerns? Yes

## 2020-11-26 NOTE — Telephone Encounter (Signed)
Pt is scheduled for 12/01/20 @ 3:40

## 2020-11-29 NOTE — Progress Notes (Signed)
Name: [name]   MRN: [id number]    DOB: 1940-12-20   Date:12/01/2020       Progress Note  [facility] Follow up   [facility] discharge follow up:   Admission: 11/19/2020 Discharge: 11/24/2020  [name] went back to [facility] on 01/28, after we did multiple tests to see if we could keep her at home following a recent hospital stay. She was gradually getting weaker and developed acute onset of SOB and drop of her pulse ox below 80 % by the time EMS arrived to her house. She was treated again with antibiotics , oxygen and once pulse oximeter improved she was sent home. She states she is feeling weak/tired but gradually symptoms are improving, cough is very mild and mostly dry, SOB is back to baseline, appetite is good, she had family and friends staying with her since discharge, but states feeling well enough to stay by herself again. No fever or chills, no change in bowel movements  Reviewed hospital records and also did medication reconciliation  We will order CXR as recommended and will also check BMP due to nephrotoxic medication.   Patient Active Problem List   Diagnosis Date Noted  . Severe sepsis ([place])   . Acquired thrombophilia ([place])   . Community acquired pneumonia 10/30/2020  . Acute respiratory failure with hypoxemia ([place]) 10/30/2020  . Multifocal pneumonia 10/30/2020  . Pyelonephritis 03/30/2020  . Acute pyelonephritis 03/29/2020  . Hydronephrosis of right kidney 03/29/2020  . Depression 03/29/2020  . Chronic diastolic CHF (congestive heart failure) ([place]) 05/24/2018  . AF (paroxysmal atrial fibrillation) ([place]) 05/24/2018  . CKD (chronic kidney disease), stage III ([place]) 05/17/2018  . 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 Shortness of breath 05/07/2018  . Pulmonary hypertension, unspecified ([place]) 04/05/2018  . Acute on chronic combined systolic and diastolic CHF (congestive heart failure) ([place]) 04/05/2018  . Paroxysmal sinus tachycardia  ([place]) 01/29/2018  . Leg pain 01/04/2018  . PAD (peripheral artery disease) ([place]) 01/04/2018  . Carotid artery calcification, bilateral 12/19/2017  . Cervical radiculitis 12/19/2017  . Hyperglycemia 03/06/2016  . Hypothyroid 04/21/2015  . 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 Anxiety 04/21/2015  . Hyperlipidemia 04/21/2015  . Diuretic-induced hypokalemia 04/21/2015  . Palpitations 12/18/2012  . Hypertension 05/27/2012    Past Surgical History:  Procedure Laterality Date  . BACK SURGERY  07/2019  . CARDIOVERSION N/A 03/26/2018   Procedure: CARDIOVERSION;  Surgeon: Nelva Bush, MD;  Location: [facility] ORS;  Service: Cardiovascular;  Laterality: N/A;  . CARDIOVERSION N/A 04/24/2018   Procedure: CARDIOVERSION;  Surgeon: Minna Merritts, MD;  Location: [facility] ORS;  Service: Cardiovascular;  Laterality: N/A;  . ELECTROPHYSIOLOGY STUDY N/A 02/27/2012   Procedure: ELECTROPHYSIOLOGY STUDY;  Surgeon: Evans Lance, MD;  Location: [facility] CATH LAB;  Service: Cardiovascular;  Laterality: N/A;  . EPS and ablation for SVT  5/13   slow pathway ablation by Dr Lovena Le  . EYE SURGERY     surgery for detatched retina  . NECK SURGERY  02/2020  . SUPRAVENTRICULAR TACHYCARDIA ABLATION N/A 02/27/2012   Procedure: SUPRAVENTRICULAR TACHYCARDIA ABLATION;  Surgeon: Evans Lance, MD;  Location: [facility] CATH LAB;  Service: Cardiovascular;  Laterality: N/A;    Family History  Problem Relation Age of Onset  . Alzheimer's disease Mother   . Stroke Father   . Breast cancer Father   . Heart disease Father        Pacemaker  . Breast cancer  Paternal Aunt   . Kidney cancer Maternal Grandmother   . Alzheimer's disease Maternal Grandfather   . Thyroid disease Paternal Grandmother   . Stroke Paternal Grandfather     Social History   Tobacco Use  . Smoking status: Never Smoker  . Smokeless tobacco: Never Used  . Tobacco comment: smoking cessation materials not required  Substance Use Topics   . Alcohol use: No     Current Outpatient Medications:  .  acetaminophen (TYLENOL) 650 MG CR tablet, Take 650-1,300 mg by mouth every 8 (eight) hours as needed for pain. , Disp: , Rfl:  .  albuterol (VENTOLIN HFA) 108 (90 Base) MCG/ACT inhaler, Inhale 2 puffs into the lungs every 6 (six) hours as needed for wheezing or shortness of breath., Disp: 8 g, Rfl: 0 .  amiodarone (PACERONE) 200 MG tablet, TAKE 1 TABLET(200 MG) BY MOUTH DAILY, Disp: 90 tablet, Rfl: 3 .  busPIRone (BUSPAR) 5 MG tablet, Take 1 tablet (5 mg total) by mouth 2 (two) times daily., Disp: 180 tablet, Rfl: 1 .  cholecalciferol (VITAMIN D) 1000 UNITS tablet, Take 2,000 Units by mouth daily., Disp: , Rfl:  .  Cranberry 500 MG TABS, Take 2 tablets by mouth daily., Disp: , Rfl:  .  doxazosin (CARDURA) 1 MG tablet, Take 1 tablet (1 mg total) by mouth every evening., Disp: 90 tablet, Rfl: 3 .  ELIQUIS 5 MG TABS tablet, TAKE 1 TABLET(5 MG) BY MOUTH TWICE DAILY, Disp: 60 tablet, Rfl: 5 .  furosemide (LASIX) 20 MG tablet, Take 1 tablet (20 mg total) by mouth See admin instructions. Take 1 tablet (20mg ) by mouth every morning - take 1 additional tablet (20mg ) after lunch if needed for swelling, Disp: 120 tablet, Rfl: 3 .  levothyroxine (SYNTHROID) 137 MCG tablet, TAKE 1 TABLET(137 MCG) BY MOUTH EVERY MORNING, Disp: 90 tablet, Rfl: 1 .  metoprolol succinate (TOPROL-XL) 50 MG 24 hr tablet, Take with or immediately following a meal., Disp: 90 tablet, Rfl: 3 .  Multiple Vitamin (MULITIVITAMIN WITH MINERALS) TABS, Take 1 tablet by mouth daily., Disp: , Rfl:  .  potassium chloride (KLOR-CON) 10 MEQ tablet, TAKE 1 TABLET BY MOUTH EVERY DAY AS DIRECTED. MAY TAKE AN EXTRA PILL AFTER LUNCH WITH FLUID PILL AS NEEDED FOR SWELLING, Disp: 135 tablet, Rfl: 3 .  rosuvastatin (CRESTOR) 40 MG tablet, TAKE 1 TABLET(40 MG) BY MOUTH DAILY, Disp: 90 tablet, Rfl: 3 .  temazepam (RESTORIL) 15 MG capsule, TAKE 1 CAPSULE(15 MG) BY MOUTH AT BEDTIME AS NEEDED FOR  SLEEP, Disp: 30 capsule, Rfl: 0 .  valsartan (DIOVAN) 160 MG tablet, Take 1 tablet (160 mg total) by mouth daily. Take in the morning, Disp: 90 tablet, Rfl: 3  Allergies  Allergen Reactions  . Chocolate Swelling  . Penicillins Swelling and Rash    Has patient had a PCN reaction causing immediate rash, facial/tongue/throat swelling, SOB or lightheadedness with hypotension: Yes Has patient had a PCN reaction causing severe rash involving mucus membranes or skin necrosis: No Has patient had a PCN reaction that required hospitalization: No Has patient had a PCN reaction occurring within the last 10 years: No If all of the above answers are "NO", then may proceed with Cephalosporin use.   . Codeine Nausea And Vomiting  . Erythromycin Itching and Swelling  . Septra [Sulfamethoxazole-Trimethoprim] Swelling    I personally reviewed active problem list, medication list, allergies, family history, social history, health maintenance, notes from last encounter, lab results, imaging with the patient/caregiver today.  ROS  Ten systems reviewed and is negative except as mentioned in HPI   Objective  Vitals:   12/01/20 1542  BP: 122/60  Pulse: 68  Resp: 16  Temp: 98.1 F (36.7 C)  TempSrc: Oral  SpO2: 98%  Weight: 127 lb 8 oz (57.8 kg)  Height: 5\' 6"  (1.676 m)    Body mass index is 20.58 kg/m.  Physical Exam  Constitutional: Patient appears well-developed and well-nourished.No distress.  HEENT: head atraumatic, normocephalic, pupils equal and reactive to light, neck supple Cardiovascular: Normal rate, regular rhythm and normal heart sounds.  No murmur heard. No BLE edema. Pulmonary/Chest: Effort normal and breath sounds normal. No respiratory distress. Abdominal: Soft.  There is no tenderness. Psychiatric: Patient has a normal mood and affect. behavior is normal. Judgment and thought content normal.  Recent Results (from the past 2160 hour(s))  Lipid panel     Status: Abnormal    Collection Time: 09/13/20 12:00 AM  Result Value Ref Range   Cholesterol 134 <200 mg/dL   HDL 56 > OR = 50 mg/dL   Triglycerides 174 (H) <150 mg/dL   LDL Cholesterol (Calc) 53 mg/dL (calc)    Comment: Reference range: <100 . Desirable range <100 mg/dL for primary prevention;   <70 mg/dL for patients with CHD or diabetic patients  with > or = 2 CHD risk factors. Marland Kitchen LDL-C is now calculated using the [place]-Hopkins  calculation, which is a validated novel method providing  better accuracy than the Friedewald equation in the  estimation of LDL-C.  Cresenciano Genre et al. Annamaria Helling. 7106;269(48): 2061-2068  (http://education.[facility].com/faq/FAQ164)    Total CHOL/HDL Ratio 2.4 <5.0 (calc)   Non-HDL Cholesterol (Calc) 78 <130 mg/dL (calc)    Comment: For patients with diabetes plus 1 major ASCVD risk  factor, treating to a non-HDL-C goal of <100 mg/dL  (LDL-C of <70 mg/dL) is considered a therapeutic  option.   COMPLETE METABOLIC PANEL WITH GFR     Status: Abnormal   Collection Time: 09/13/20 12:00 AM  Result Value Ref Range   Glucose, Bld 100 (H) 65 - 99 mg/dL    Comment: .            Fasting reference interval . For someone without known diabetes, a glucose value between 100 and 125 mg/dL is consistent with prediabetes and should be confirmed with a follow-up test. .    BUN 17 7 - 25 mg/dL   Creat 1.14 (H) 0.60 - 0.93 mg/dL    Comment: For patients >80 years of age, the reference limit for Creatinine is approximately 13% higher for people identified as African-American. .    GFR, Est Non African American 46 (L) > OR = 60 mL/min/1.58m2   GFR, Est African American 53 (L) > OR = 60 mL/min/1.71m2   BUN/Creatinine Ratio 15 6 - 22 (calc)   Sodium 142 135 - 146 mmol/L   Potassium 5.0 3.5 - 5.3 mmol/L   Chloride 104 98 - 110 mmol/L   CO2 25 20 - 32 mmol/L   Calcium 9.8 8.6 - 10.4 mg/dL   Total Protein 7.3 6.1 - 8.1 g/dL   Albumin 4.8 3.6 - 5.1 g/dL   Globulin 2.5 1.9 - 3.7  g/dL (calc)   AG Ratio 1.9 1.0 - 2.5 (calc)   Total Bilirubin 0.6 0.2 - 1.2 mg/dL   Alkaline phosphatase (APISO) 81 37 - 153 U/L   AST 27 10 - 35 U/L   ALT 23 6 - 29 U/L  CBC  with Differential/Platelet     Status: None   Collection Time: 09/13/20 12:00 AM  Result Value Ref Range   WBC 6.8 3.8 - 10.8 Thousand/uL   RBC 3.99 3.80 - 5.10 Million/uL   Hemoglobin 12.6 11.7 - 15.5 g/dL   HCT 38.0 35.0 - 45.0 %   MCV 95.2 80.0 - 100.0 fL   MCH 31.6 27.0 - 33.0 pg   MCHC 33.2 32.0 - 36.0 g/dL   RDW 13.1 11.0 - 15.0 %   Platelets 271 140 - 400 Thousand/uL   MPV 9.1 7.5 - 12.5 fL   Neutro Abs 4,991 1,500 - 7,800 cells/uL   Lymphs Abs 1,265 850 - 3,900 cells/uL   Absolute Monocytes 435 200 - 950 cells/uL   Eosinophils Absolute 82 15 - 500 cells/uL   Basophils Absolute 27 0 - 200 cells/uL   Neutrophils Relative % 73.4 %   Total Lymphocyte 18.6 %   Monocytes Relative 6.4 %   Eosinophils Relative 1.2 %   Basophils Relative 0.4 %  Hemoglobin A1c     Status: None   Collection Time: 09/13/20 12:00 AM  Result Value Ref Range   Hgb A1c MFr Bld 5.2 <5.7 % of total Hgb    Comment: For the purpose of screening for the presence of diabetes: . <5.7%       Consistent with the absence of diabetes 5.7-6.4%    Consistent with increased risk for diabetes             (prediabetes) > or =6.5%  Consistent with diabetes . This assay result is consistent with a decreased risk of diabetes. . Currently, no consensus exists regarding use of hemoglobin A1c for diagnosis of diabetes in children. . According to American Diabetes Association (ADA) guidelines, hemoglobin A1c <7.0% represents optimal control in non-pregnant diabetic patients. Different metrics may apply to specific patient populations.  Standards of Medical Care in Diabetes(ADA). .    Mean Plasma Glucose 103 (calc)   eAG (mmol/L) 5.7 (calc)  Hepatitis C antibody     Status: None   Collection Time: 09/13/20 12:00 AM  Result Value Ref  Range   Hepatitis C Ab NON-REACTIVE NON-REACTI   SIGNAL TO CUT-OFF 0.01 <1.00    Comment: . HCV antibody was non-reactive. There is no laboratory  evidence of HCV infection. . In most cases, no further action is required. However, if recent HCV exposure is suspected, a test for HCV RNA (test code [phone]) is suggested. . For additional information please refer to http://education.questdiagnostics.com/faq/FAQ22v1 (This link is being provided for informational/ educational purposes only.) .   TSH     Status: Abnormal   Collection Time: 09/13/20 12:00 AM  Result Value Ref Range   TSH 5.09 (H) 0.40 - 4.50 mIU/L  CULTURE, URINE COMPREHENSIVE     Status: None   Collection Time: 09/13/20 12:00 AM   Specimen: Urine  Result Value Ref Range   MICRO NUMBER: [id number]    SPECIMEN QUALITY: Adequate    Source OTHER (SPECIFY)    STATUS: FINAL    RESULT: No Growth   CBC with Differential     Status: Abnormal   Collection Time: 10/30/20  4:03 AM  Result Value Ref Range   WBC 13.7 (H) 4.0 - 10.5 K/uL   RBC 3.24 (L) 3.87 - 5.11 MIL/uL   Hemoglobin 10.3 (L) 12.0 - 15.0 g/dL   HCT 30.3 (L) 36.0 - 46.0 %   MCV 93.5 80.0 - 100.0 fL   MCH 31.8 26.0 -  34.0 pg   MCHC 34.0 30.0 - 36.0 g/dL   RDW 13.5 11.5 - 15.5 %   Platelets 279 150 - 400 K/uL   nRBC 0.0 0.0 - 0.2 %   Neutrophils Relative % 88 %   Neutro Abs 12.0 (H) 1.7 - 7.7 K/uL   Lymphocytes Relative 3 %   Lymphs Abs 0.5 (L) 0.7 - 4.0 K/uL   Monocytes Relative 8 %   Monocytes Absolute 1.1 (H) 0.1 - 1.0 K/uL   Eosinophils Relative 0 %   Eosinophils Absolute 0.0 0.0 - 0.5 K/uL   Basophils Relative 0 %   Basophils Absolute 0.0 0.0 - 0.1 K/uL   Immature Granulocytes 1 %   Abs Immature Granulocytes 0.09 (H) 0.00 - 0.07 K/uL    Comment: Performed at [facility], [address], [place], [place] [address]  Basic metabolic panel     Status: Abnormal   Collection Time: 10/30/20  4:03 AM  Result Value Ref Range   Sodium 136 135 -  145 mmol/L   Potassium 3.0 (L) 3.5 - 5.1 mmol/L   Chloride 103 98 - 111 mmol/L   CO2 22 22 - 32 mmol/L   Glucose, Bld 154 (H) 70 - 99 mg/dL    Comment: Glucose reference range applies only to samples taken after fasting for at least 8 hours.   BUN 16 8 - 23 mg/dL   Creatinine, Ser 0.93 0.44 - 1.00 mg/dL   Calcium 8.4 (L) 8.9 - 10.3 mg/dL   GFR, Estimated >60 >60 mL/min    Comment: (NOTE) Calculated using the CKD-EPI Creatinine Equation (2021)    Anion gap 11 5 - 15    Comment: Performed at [facility], [address] [place], [place] [address]  Brain natriuretic peptide     Status: Abnormal   Collection Time: 10/30/20  4:03 AM  Result Value Ref Range   B Natriuretic Peptide 179.6 (H) 0.0 - 100.0 pg/mL    Comment: Performed at [facility], [place], [place] [address]  Troponin I (High Sensitivity)     Status: Abnormal   Collection Time: 10/30/20  4:03 AM  Result Value Ref Range   Troponin I (High Sensitivity) 18 (H) <18 ng/L    Comment: (NOTE) Elevated high sensitivity troponin I (hsTnI) values and significant  changes across serial measurements may suggest ACS but many other  chronic and acute conditions are known to elevate hsTnI results.  Refer to the "Links" section for chest pain algorithms and additional  guidance. Performed at [facility], [address] [place], [place] [address]   Procalcitonin - Baseline     Status: None   Collection Time: 10/30/20  4:03 AM  Result Value Ref Range   Procalcitonin <0.10 ng/mL    Comment:        Interpretation: PCT (Procalcitonin) <= 0.5 ng/mL: Systemic infection (sepsis) is not likely. Local bacterial infection is possible. (NOTE)       Sepsis PCT Algorithm           Lower Respiratory Tract                                      Infection PCT Algorithm    ----------------------------     ----------------------------         PCT < 0.25 ng/mL  PCT < 0.10 ng/mL           Strongly encourage             Strongly discourage   discontinuation of antibiotics    initiation of antibiotics    ----------------------------     -----------------------------       PCT 0.25 - 0.50 ng/mL            PCT 0.10 - 0.25 ng/mL               OR       >80% decrease in PCT            Discourage initiation of                                            antibiotics      Encourage discontinuation           of antibiotics    ----------------------------     -----------------------------         PCT >= 0.50 ng/mL              PCT 0.26 - 0.50 ng/mL               AND        <80% decrease in PCT             Encourage initiation of                                             antibiotics       Encourage continuation           of antibiotics    ----------------------------     -----------------------------        PCT >= 0.50 ng/mL                  PCT > 0.50 ng/mL               AND         increase in PCT                  Strongly encourage                                      initiation of antibiotics    Strongly encourage escalation           of antibiotics                                     -----------------------------                                           PCT <= 0.25 ng/mL                                                 OR                                        >  80% decrease in PCT                                      Discontinue / Do not initiate                                             antibiotics  Performed at [facility], [address] [place], [place] [address]   Lactic acid, plasma     Status: None   Collection Time: 10/30/20  4:03 AM  Result Value Ref Range   Lactic Acid, Venous 0.8 0.5 - 1.9 mmol/L    Comment: Performed at [facility], [address] [place], [place] [address]  Lactate dehydrogenase     Status: None   Collection Time: 10/30/20  4:03 AM  Result Value Ref Range   LDH 190 98 - 192 U/L    Comment: Performed at  [facility], [address] [place], [place] [address]  Ferritin     Status: None   Collection Time: 10/30/20  4:03 AM  Result Value Ref Range   Ferritin 126 11 - 307 ng/mL    Comment: Performed at [facility], [address] [place], [place] [address]  Triglycerides     Status: None   Collection Time: 10/30/20  4:03 AM  Result Value Ref Range   Triglycerides 54 <150 mg/dL    Comment: Performed at [facility], [address] [place], [place] [address]  Fibrinogen     Status: Abnormal   Collection Time: 10/30/20  4:03 AM  Result Value Ref Range   Fibrinogen 715 (H) 210 - 475 mg/dL    Comment: Performed at [facility], [place], [place] [address]  C-reactive protein     Status: Abnormal   Collection Time: 10/30/20  4:03 AM  Result Value Ref Range   CRP 21.2 (H) <1.0 mg/dL    Comment: Performed at [facility], [address] [place], [place] [address]  Hepatic function panel     Status: Abnormal   Collection Time: 10/30/20  4:03 AM  Result Value Ref Range   Total Protein 6.8 6.5 - 8.1 g/dL   Albumin 3.1 (L) 3.5 - 5.0 g/dL   AST 47 (H) 15 - 41 U/L   ALT 42 0 - 44 U/L   Alkaline Phosphatase 80 38 - 126 U/L   Total Bilirubin 0.9 0.3 - 1.2 mg/dL   Bilirubin, Direct 0.2 0.0 - 0.2 mg/dL   Indirect Bilirubin 0.7 0.3 - 0.9 mg/dL    Comment: Performed at [facility], [address] [place], [place] [address]  D-dimer, quantitative (not at [facility])     Status: Abnormal   Collection Time: 10/30/20  4:03 AM  Result Value Ref Range   D-Dimer, Quant 1.82 (H) 0.00 - 0.50 ug/mL-FEU    Comment: (NOTE) At the manufacturer cut-off value of 0.5 g/mL FEU, this assay has a negative predictive value of 95-100%.This assay is intended for use in conjunction with a clinical pretest probability (PTP) assessment model to exclude pulmonary embolism (PE) and deep venous thrombosis (DVT) in outpatients suspected of PE or  DVT. Results should be correlated with clinical presentation. Performed at [facility], [address],  [place] [address]   Magnesium     Status: None   Collection Time: 10/30/20  4:03 AM  Result Value Ref Range   Magnesium 2.0 1.7 - 2.4 mg/dL    Comment: Performed at [facility], [address] [place], [place] [address]  TSH     Status: None   Collection Time: 10/30/20  4:03 AM  Result Value Ref Range   TSH 0.852 0.350 - 4.500 uIU/mL    Comment: Performed by a 3rd Generation assay with a functional sensitivity of <=0.01 uIU/mL. Performed at [facility], [address] [place], [place] [address]   Blood Culture (routine x 2)     Status: None   Collection Time: 10/30/20  4:45 AM   Specimen: BLOOD  Result Value Ref Range   Specimen Description BLOOD LEFT ANTECUBITAL    Special Requests      BOTTLES DRAWN AEROBIC AND ANAEROBIC Blood Culture adequate volume   Culture      NO GROWTH 5 DAYS Performed at [facility], [address], [place], [place] [address]    Report Status 11/04/2020 FINAL   Blood Culture (routine x 2)     Status: None   Collection Time: 10/30/20  5:00 AM   Specimen: BLOOD  Result Value Ref Range   Specimen Description BLOOD RIGHT ANTECUBITAL    Special Requests      BOTTLES DRAWN AEROBIC AND ANAEROBIC Blood Culture adequate volume   Culture      NO GROWTH 5 DAYS Performed at [facility], [address] [place], [place] [address]    Report Status 11/04/2020 FINAL   Troponin I (High Sensitivity)     Status: Abnormal   Collection Time: 10/30/20  6:35 AM  Result Value Ref Range   Troponin I (High Sensitivity) 18 (H) <18 ng/L    Comment: (NOTE) Elevated high sensitivity troponin I (hsTnI) values and significant  changes across serial measurements may suggest ACS but many other  chronic and acute conditions are known to elevate hsTnI results.  Refer to the "Links" section for chest pain algorithms  and additional  guidance. Performed at [facility], [address] [place], [place] [address]   Resp Panel by RT-PCR (Flu A&B, Covid) Nasopharyngeal Swab     Status: None   Collection Time: 10/30/20  6:35 AM   Specimen: Nasopharyngeal Swab; Nasopharyngeal(NP) swabs in vial transport medium  Result Value Ref Range   SARS Coronavirus 2 by RT PCR NEGATIVE NEGATIVE    Comment: (NOTE) SARS-CoV-2 target nucleic acids are NOT DETECTED.  The SARS-CoV-2 RNA is generally detectable in upper respiratory specimens during the acute phase of infection. The lowest concentration of SARS-CoV-2 viral copies this assay can detect is 138 copies/mL. A negative result does not preclude SARS-Cov-2 infection and should not be used as the sole basis for treatment or other patient management decisions. A negative result may occur with  improper specimen collection/handling, submission of specimen other than nasopharyngeal swab, presence of viral mutation(s) within the areas targeted by this assay, and inadequate number of viral copies(<138 copies/mL). A negative result must be combined with clinical observations, patient history, and epidemiological information. The expected result is Negative.  Fact Sheet for Patients:  EntrepreneurPulse.com.au  Fact Sheet for Healthcare Providers:  IncredibleEmployment.be  This test is no t yet approved or cleared by the [place] FDA and  has been authorized for detection and/or diagnosis of SARS-CoV-2 by FDA under an Emergency Use Authorization (EUA). This  EUA will remain  in effect (meaning this test can be used) for the duration of the COVID-19 declaration under Section 564(b)(1) of the Act, 21 U.S.C.section 360bbb-3(b)(1), unless the authorization is terminated  or revoked sooner.       Influenza A by PCR NEGATIVE NEGATIVE   Influenza B by PCR NEGATIVE NEGATIVE    Comment: (NOTE) The Xpert Xpress  SARS-CoV-2/FLU/RSV plus assay is intended as an aid in the diagnosis of influenza from Nasopharyngeal swab specimens and should not be used as a sole basis for treatment. Nasal washings and aspirates are unacceptable for Xpert Xpress SARS-CoV-2/FLU/RSV testing.  Fact Sheet for Patients: EntrepreneurPulse.com.au  Fact Sheet for Healthcare Providers: IncredibleEmployment.be  This test is not yet approved or cleared by the [place] FDA and has been authorized for detection and/or diagnosis of SARS-CoV-2 by FDA under an Emergency Use Authorization (EUA). This EUA will remain in effect (meaning this test can be used) for the duration of the COVID-19 declaration under Section 564(b)(1) of the Act, 21 U.S.C. section 360bbb-3(b)(1), unless the authorization is terminated or revoked.  Performed at [facility], [place], [place] [address]   SARS CORONAVIRUS 2 (TAT 6-24 HRS) Nasopharyngeal Nasopharyngeal Swab     Status: None   Collection Time: 10/30/20  2:13 PM   Specimen: Nasopharyngeal Swab  Result Value Ref Range   SARS Coronavirus 2 NEGATIVE NEGATIVE    Comment: (NOTE) SARS-CoV-2 target nucleic acids are NOT DETECTED.  The SARS-CoV-2 RNA is generally detectable in upper and lower respiratory specimens during the acute phase of infection. Negative results do not preclude SARS-CoV-2 infection, do not rule out co-infections with other pathogens, and should not be used as the sole basis for treatment or other patient management decisions. Negative results must be combined with clinical observations, patient history, and epidemiological information. The expected result is Negative.  Fact Sheet for Patients: SugarRoll.be  Fact Sheet for Healthcare Providers: https://www.woods-mathews.com/  This test is not yet approved or cleared by the [place] FDA and  has been  authorized for detection and/or diagnosis of SARS-CoV-2 by FDA under an Emergency Use Authorization (EUA). This EUA will remain  in effect (meaning this test can be used) for the duration of the COVID-19 declaration under Se ction 564(b)(1) of the Act, 21 U.S.C. section 360bbb-3(b)(1), unless the authorization is terminated or revoked sooner.  Performed at [facility], [place] [address], [place], [place] [address]   Strep pneumoniae urinary antigen     Status: None   Collection Time: 10/30/20  2:13 PM  Result Value Ref Range   Strep Pneumo Urinary Antigen NEGATIVE NEGATIVE    Comment:        Infection due to S. pneumoniae cannot be absolutely ruled out since the antigen present may be below the detection limit of the test. Performed at [facility], [address] [place], [place] [address]   Legionella Pneumophila Serogp 1 Ur Ag     Status: None   Collection Time: 10/30/20  2:13 PM  Result Value Ref Range   L. pneumophila Serogp 1 Ur Ag Negative Negative    Comment: (NOTE) Presumptive negative for L. pneumophila serogroup 1 antigen in urine, suggesting no recent or current infection. Legionnaires' disease cannot be ruled out since other serogroups and species may also cause disease. Performed At: [facility] [place], [place] [id number] Rush Farmer MD LP:[id number]    Source of Sample URINE, RANDOM  Comment: Performed at [facility], [address] [place], [place] [address]  Urinalysis, Complete w Microscopic     Status: Abnormal   Collection Time: 10/30/20  4:24 PM  Result Value Ref Range   Color, Urine STRAW (A) YELLOW   APPearance CLEAR (A) CLEAR   Specific Gravity, Urine 1.005 1.005 - 1.030   pH 5.0 5.0 - 8.0   Glucose, UA NEGATIVE NEGATIVE mg/dL   Hgb urine dipstick SMALL (A) NEGATIVE   Bilirubin Urine NEGATIVE NEGATIVE   Ketones, ur NEGATIVE NEGATIVE mg/dL   Protein, ur NEGATIVE NEGATIVE mg/dL   Nitrite  NEGATIVE NEGATIVE   Leukocytes,Ua NEGATIVE NEGATIVE   RBC / HPF 0-5 0 - 5 RBC/hpf   WBC, UA 0-5 0 - 5 WBC/hpf   Bacteria, UA NONE SEEN NONE SEEN   Squamous Epithelial / LPF 0-5 0 - 5   Mucus PRESENT     Comment: Performed at [facility], [address], [place], [place] [address]  Basic metabolic panel     Status: Abnormal   Collection Time: 10/31/20  4:56 AM  Result Value Ref Range   Sodium 137 135 - 145 mmol/L   Potassium 4.0 3.5 - 5.1 mmol/L   Chloride 104 98 - 111 mmol/L   CO2 23 22 - 32 mmol/L   Glucose, Bld 114 (H) 70 - 99 mg/dL    Comment: Glucose reference range applies only to samples taken after fasting for at least 8 hours.   BUN 16 8 - 23 mg/dL   Creatinine, Ser 0.88 0.44 - 1.00 mg/dL   Calcium 9.0 8.9 - 10.3 mg/dL   GFR, Estimated >60 >60 mL/min    Comment: (NOTE) Calculated using the CKD-EPI Creatinine Equation (2021)    Anion gap 10 5 - 15    Comment: Performed at [facility], [address] [place], [place] [address]  CBC     Status: Abnormal   Collection Time: 10/31/20  4:56 AM  Result Value Ref Range   WBC 13.1 (H) 4.0 - 10.5 K/uL   RBC 3.55 (L) 3.87 - 5.11 MIL/uL   Hemoglobin 11.4 (L) 12.0 - 15.0 g/dL   HCT 32.6 (L) 36.0 - 46.0 %   MCV 91.8 80.0 - 100.0 fL   MCH 32.1 26.0 - 34.0 pg   MCHC 35.0 30.0 - 36.0 g/dL   RDW 13.6 11.5 - 15.5 %   Platelets 318 150 - 400 K/uL   nRBC 0.0 0.0 - 0.2 %    Comment: Performed at [facility], [address] [place], [place] [address]  ECHOCARDIOGRAM COMPLETE     Status: None   Collection Time: 10/31/20  2:45 PM  Result Value Ref Range   Weight 2,028.8 oz   Height 66 in   BP 110/50 mmHg   S' Lateral 3.04 cm   Area-P 1/2 4.06 cm2  Basic metabolic panel     Status: Abnormal   Collection Time: 11/01/20  2:52 AM  Result Value Ref Range   Sodium 138 135 - 145 mmol/L   Potassium 3.9 3.5 - 5.1 mmol/L   Chloride 105 98 - 111 mmol/L   CO2 23 22 - 32 mmol/L   Glucose, Bld 168 (H) 70 - 99  mg/dL    Comment: Glucose reference range applies only to samples taken after fasting for at least 8 hours.   BUN 22 8 - 23 mg/dL   Creatinine, Ser 1.05 (H) 0.44 - 1.00 mg/dL   Calcium 9.1  8.9 - 10.3 mg/dL   GFR, Estimated 54 (L) >60 mL/min    Comment: (NOTE) Calculated using the CKD-EPI Creatinine Equation (2021)    Anion gap 10 5 - 15    Comment: Performed at [facility], [address] [place], [place] [address]  CBC with Differential/Platelet     Status: Abnormal   Collection Time: 11/18/20  9:57 AM  Result Value Ref Range   WBC 10.5 4.0 - 10.5 K/uL   RBC 3.42 (L) 3.87 - 5.11 MIL/uL   Hemoglobin 10.5 (L) 12.0 - 15.0 g/dL   HCT 32.1 (L) 36.0 - 46.0 %   MCV 93.9 80.0 - 100.0 fL   MCH 30.7 26.0 - 34.0 pg   MCHC 32.7 30.0 - 36.0 g/dL   RDW 14.7 11.5 - 15.5 %   Platelets 255 150 - 400 K/uL   nRBC 0.0 0.0 - 0.2 %   Neutrophils Relative % 81 %   Neutro Abs 8.7 (H) 1.7 - 7.7 K/uL   Lymphocytes Relative 9 %   Lymphs Abs 0.9 0.7 - 4.0 K/uL   Monocytes Relative 8 %   Monocytes Absolute 0.8 0.1 - 1.0 K/uL   Eosinophils Relative 1 %   Eosinophils Absolute 0.1 0.0 - 0.5 K/uL   Basophils Relative 0 %   Basophils Absolute 0.0 0.0 - 0.1 K/uL   Immature Granulocytes 1 %   Abs Immature Granulocytes 0.07 0.00 - 0.07 K/uL    Comment: Performed at [facility], [address] [place], [place] [address]  Comprehensive metabolic panel     Status: Abnormal   Collection Time: 11/18/20  9:57 AM  Result Value Ref Range   Sodium 138 135 - 145 mmol/L   Potassium 3.5 3.5 - 5.1 mmol/L   Chloride 103 98 - 111 mmol/L   CO2 23 22 - 32 mmol/L   Glucose, Bld 177 (H) 70 - 99 mg/dL    Comment: Glucose reference range applies only to samples taken after fasting for at least 8 hours.   BUN 18 8 - 23 mg/dL   Creatinine, Ser 1.06 (H) 0.44 - 1.00 mg/dL   Calcium 8.8 (L) 8.9 - 10.3 mg/dL   Total Protein 7.4 6.5 - 8.1 g/dL   Albumin 3.3 (L) 3.5 - 5.0 g/dL   AST 25 15 - 41 U/L   ALT  18 0 - 44 U/L   Alkaline Phosphatase 80 38 - 126 U/L   Total Bilirubin 0.6 0.3 - 1.2 mg/dL   GFR, Estimated 54 (L) >60 mL/min    Comment: (NOTE) Calculated using the CKD-EPI Creatinine Equation (2021)    Anion gap 12 5 - 15    Comment: Performed at [facility], [address] [place], [place] [address]  C-reactive protein     Status: Abnormal   Collection Time: 11/18/20  9:57 AM  Result Value Ref Range   CRP 16.4 (H) <1.0 mg/dL    Comment: Performed at [place] [address], [place], [place] [address]  Brain natriuretic peptide     Status: Abnormal   Collection Time: 11/18/20  9:57 AM  Result Value Ref Range   B Natriuretic Peptide 248.6 (H) 0.0 - 100.0 pg/mL    Comment: Performed at [facility], [address] [place], [place] [address]  Fibrin derivatives [facility] only)     Status: Abnormal   Collection Time: 11/18/20  9:57 AM  Result Value Ref Range   Fibrin derivatives D-dimer [facility])  1,080.58 (H) 0.00 - 499.00 ng/mL (FEU)    Comment: (NOTE) <> Exclusion of Venous Thromboembolism (VTE) - OUTPATIENT ONLY   (Emergency Department or [place])    0-499 ng/ml (FEU): With a low to intermediate pretest probability                      for VTE this test result excludes the diagnosis                      of VTE.   >499 ng/ml (FEU) : VTE not excluded; additional work up for VTE is                      required.  <> Testing on Inpatients and Evaluation of Disseminated Intravascular   Coagulation (DIC) Reference Range:   0-499 ng/ml (FEU) Performed at [facility], [address] [place], [place] [address]   Procalcitonin     Status: None   Collection Time: 11/18/20  9:57 AM  Result Value Ref Range   Procalcitonin <0.10 ng/mL    Comment:        Interpretation: PCT (Procalcitonin) <= 0.5 ng/mL: Systemic infection (sepsis) is not likely. Local bacterial infection is possible. (NOTE)       Sepsis PCT Algorithm           Lower  Respiratory Tract                                      Infection PCT Algorithm    ----------------------------     ----------------------------         PCT < 0.25 ng/mL                PCT < 0.10 ng/mL          Strongly encourage             Strongly discourage   discontinuation of antibiotics    initiation of antibiotics    ----------------------------     -----------------------------       PCT 0.25 - 0.50 ng/mL            PCT 0.10 - 0.25 ng/mL               OR       >80% decrease in PCT            Discourage initiation of                                            antibiotics      Encourage discontinuation           of antibiotics    ----------------------------     -----------------------------         PCT >= 0.50 ng/mL              PCT 0.26 - 0.50 ng/mL               AND        <80% decrease in PCT             Encourage initiation of  antibiotics       Encourage continuation           of antibiotics    ----------------------------     -----------------------------        PCT >= 0.50 ng/mL                  PCT > 0.50 ng/mL               AND         increase in PCT                  Strongly encourage                                      initiation of antibiotics    Strongly encourage escalation           of antibiotics                                     -----------------------------                                           PCT <= 0.25 ng/mL                                                 OR                                        > 80% decrease in PCT                                      Discontinue / Do not initiate                                             antibiotics  Performed at [facility], [address] [place], [place] [address]   Novel Coronavirus, NAA ([facility])     Status: None   Collection Time: 11/18/20  5:59 PM   Specimen: Nasopharyngeal(NP) swabs in vial transport medium   Nasopharynge  Screenin   Result Value Ref Range   SARS-CoV-2, NAA Not Detected Not Detected    Comment: This nucleic acid amplification test was developed and its performance characteristics determined by [employer]. Nucleic acid amplification tests include RT-PCR and TMA. This test has not been FDA cleared or approved. This test has been authorized by FDA under an Emergency Use Authorization (EUA). This test is only authorized for the duration of time the declaration that circumstances exist justifying the authorization of the emergency use of in vitro diagnostic tests for detection of SARS-CoV-2 virus and/or diagnosis of COVID-19 infection under section 564(b)(1) of the Act, 21 U.S.C. 166AYT-0(Z) (1), unless the authorization is terminated or revoked sooner. When diagnostic testing is negative, the possibility  of a false negative result should be considered in the context of a patient's recent exposures and the presence of clinical signs and symptoms consistent with COVID-19. An individual without symptoms of COVID-19 and who is not shedding SARS-CoV-2 virus wo uld expect to have a negative (not detected) result in this assay.   SARS-COV-2, NAA 2 DAY TAT     Status: None   Collection Time: 11/18/20  5:59 PM   Nasopharynge  Screenin  Result Value Ref Range   SARS-CoV-2, NAA 2 DAY TAT Performed   SARS Coronavirus 2 by RT PCR (hospital order, performed in [place] hospital lab) Nasopharyngeal Nasopharyngeal Swab     Status: None   Collection Time: 11/19/20 11:38 PM   Specimen: Nasopharyngeal Swab  Result Value Ref Range   SARS Coronavirus 2 NEGATIVE NEGATIVE    Comment: (NOTE) SARS-CoV-2 target nucleic acids are NOT DETECTED.  The SARS-CoV-2 RNA is generally detectable in upper and lower respiratory specimens during the acute phase of infection. The lowest concentration of SARS-CoV-2 viral copies this assay can detect is 250 copies / mL. A negative result does not preclude SARS-CoV-2  infection and should not be used as the sole basis for treatment or other patient management decisions.  A negative result may occur with improper specimen collection / handling, submission of specimen other than nasopharyngeal swab, presence of viral mutation(s) within the areas targeted by this assay, and inadequate number of viral copies (<250 copies / mL). A negative result must be combined with clinical observations, patient history, and epidemiological information.  Fact Sheet for Patients:   StrictlyIdeas.no  Fact Sheet for Healthcare Providers: BankingDealers.co.za  This test is not yet approved or  cleared by the [place] FDA and has been authorized for detection and/or diagnosis of SARS-CoV-2 by FDA under an Emergency Use Authorization (EUA).  This EUA will remain in effect (meaning this test can be used) for the duration of the COVID-19 declaration under Section 564(b)(1) of the Act, 21 U.S.C. section 360bbb-3(b)(1), unless the authorization is terminated or revoked sooner.  Performed at [facility], [address] [place], [place] [address]   Comprehensive metabolic panel     Status: Abnormal   Collection Time: 11/19/20 11:38 PM  Result Value Ref Range   Sodium 137 135 - 145 mmol/L   Potassium 3.6 3.5 - 5.1 mmol/L   Chloride 101 98 - 111 mmol/L   CO2 25 22 - 32 mmol/L   Glucose, Bld 155 (H) 70 - 99 mg/dL    Comment: Glucose reference range applies only to samples taken after fasting for at least 8 hours.   BUN 23 8 - 23 mg/dL   Creatinine, Ser 1.00 0.44 - 1.00 mg/dL   Calcium 8.8 (L) 8.9 - 10.3 mg/dL   Total Protein 7.5 6.5 - 8.1 g/dL   Albumin 3.0 (L) 3.5 - 5.0 g/dL   AST 38 15 - 41 U/L   ALT 29 0 - 44 U/L   Alkaline Phosphatase 86 38 - 126 U/L   Total Bilirubin 0.7 0.3 - 1.2 mg/dL   GFR, Estimated 58 (L) >60 mL/min    Comment: (NOTE) Calculated using the CKD-EPI Creatinine Equation (2021)     Anion gap 11 5 - 15    Comment: Performed at [facility], [address] [place], [place] [address]  Brain natriuretic peptide     Status: Abnormal   Collection Time: 11/19/20 11:38 PM  Result Value Ref Range   B Natriuretic  Peptide 207.8 (H) 0.0 - 100.0 pg/mL    Comment: Performed at [facility], [place], [place] [address]  Troponin I (High Sensitivity)     Status: None   Collection Time: 11/19/20 11:38 PM  Result Value Ref Range   Troponin I (High Sensitivity) 13 <18 ng/L    Comment: (NOTE) Elevated high sensitivity troponin I (hsTnI) values and significant  changes across serial measurements may suggest ACS but many other  chronic and acute conditions are known to elevate hsTnI results.  Refer to the "Links" section for chest pain algorithms and additional  guidance. Performed at [facility], [address] [place], [place] [address]   Lactic acid, plasma     Status: None   Collection Time: 11/19/20 11:38 PM  Result Value Ref Range   Lactic Acid, Venous 1.3 0.5 - 1.9 mmol/L    Comment: Performed at [facility], [address] [place], [place] [address]  CBC with Differential     Status: Abnormal   Collection Time: 11/19/20 11:38 PM  Result Value Ref Range   WBC 12.8 (H) 4.0 - 10.5 K/uL   RBC 3.45 (L) 3.87 - 5.11 MIL/uL   Hemoglobin 10.6 (L) 12.0 - 15.0 g/dL   HCT 32.0 (L) 36.0 - 46.0 %   MCV 92.8 80.0 - 100.0 fL   MCH 30.7 26.0 - 34.0 pg   MCHC 33.1 30.0 - 36.0 g/dL   RDW 14.6 11.5 - 15.5 %   Platelets 260 150 - 400 K/uL   nRBC 0.0 0.0 - 0.2 %   Neutrophils Relative % 88 %   Neutro Abs 11.3 (H) 1.7 - 7.7 K/uL   Lymphocytes Relative 5 %   Lymphs Abs 0.7 0.7 - 4.0 K/uL   Monocytes Relative 5 %   Monocytes Absolute 0.6 0.1 - 1.0 K/uL   Eosinophils Relative 1 %   Eosinophils Absolute 0.1 0.0 - 0.5 K/uL   Basophils Relative 0 %   Basophils Absolute 0.0 0.0 - 0.1 K/uL   Immature Granulocytes 1 %   Abs  Immature Granulocytes 0.06 0.00 - 0.07 K/uL    Comment: Performed at [facility], [address] [place], [place] [address]  Protime-INR     Status: Abnormal   Collection Time: 11/19/20 11:38 PM  Result Value Ref Range   Prothrombin Time 16.9 (H) 11.4 - 15.2 seconds   INR 1.4 (H) 0.8 - 1.2    Comment: (NOTE) INR goal varies based on device and disease states. Performed at [facility], [address] [place], [place] [address]   Blood culture (routine x 2)     Status: None   Collection Time: 11/20/20  1:33 AM   Specimen: Right Antecubital; Blood  Result Value Ref Range   Specimen Description RIGHT ANTECUBITAL    Special Requests      BOTTLES DRAWN AEROBIC AND ANAEROBIC Blood Culture adequate volume   Culture      NO GROWTH 5 DAYS Performed at [facility], [address] [place], [place] [address]    Report Status 11/25/2020 FINAL   Troponin I (High Sensitivity)     Status: Abnormal   Collection Time: 11/20/20  1:35 AM  Result Value Ref Range   Troponin I (High Sensitivity) 39 (H) <18 ng/L    Comment: RESULT CALLED TO, READ BACK BY AND VERIFIED WITH: Charlotte Crumb AT 5732 11/20/20.PMF (NOTE) Elevated high sensitivity troponin I (hsTnI) values and significant  changes across  serial measurements may suggest ACS but many other  chronic and acute conditions are known to elevate hsTnI results.  Refer to the Links section for chest pain algorithms and additional  guidance. Performed at [facility], [address] [place], [place] [address]   Lactic acid, plasma     Status: None   Collection Time: 11/20/20  2:36 AM  Result Value Ref Range   Lactic Acid, Venous 1.4 0.5 - 1.9 mmol/L    Comment: Performed at [facility], [address] [place], [place] [address]  Basic metabolic panel     Status: Abnormal   Collection Time: 11/20/20  4:50 AM  Result Value Ref Range   Sodium 137 135 - 145 mmol/L   Potassium 3.8 3.5 - 5.1  mmol/L   Chloride 104 98 - 111 mmol/L   CO2 22 22 - 32 mmol/L   Glucose, Bld 175 (H) 70 - 99 mg/dL    Comment: Glucose reference range applies only to samples taken after fasting for at least 8 hours.   BUN 21 8 - 23 mg/dL   Creatinine, Ser 0.83 0.44 - 1.00 mg/dL   Calcium 8.3 (L) 8.9 - 10.3 mg/dL   GFR, Estimated >60 >60 mL/min    Comment: (NOTE) Calculated using the CKD-EPI Creatinine Equation (2021)    Anion gap 11 5 - 15    Comment: Performed at [facility], [address] [place], [place] [address]  CBC     Status: Abnormal   Collection Time: 11/20/20  4:50 AM  Result Value Ref Range   WBC 13.8 (H) 4.0 - 10.5 K/uL   RBC 2.96 (L) 3.87 - 5.11 MIL/uL   Hemoglobin 9.0 (L) 12.0 - 15.0 g/dL   HCT 27.7 (L) 36.0 - 46.0 %   MCV 93.6 80.0 - 100.0 fL   MCH 30.4 26.0 - 34.0 pg   MCHC 32.5 30.0 - 36.0 g/dL   RDW 14.6 11.5 - 15.5 %   Platelets 234 150 - 400 K/uL   nRBC 0.0 0.0 - 0.2 %    Comment: Performed at [facility], [address] [place], [place] [address]  Lactic acid, plasma     Status: None   Collection Time: 11/20/20  4:50 AM  Result Value Ref Range   Lactic Acid, Venous 0.9 0.5 - 1.9 mmol/L    Comment: Performed at [facility], [address] [place], [place] [address]  Strep pneumoniae urinary antigen     Status: None   Collection Time: 11/20/20  5:15 AM  Result Value Ref Range   Strep Pneumo Urinary Antigen NEGATIVE NEGATIVE    Comment:        Infection due to S. pneumoniae cannot be absolutely ruled out since the antigen present may be below the detection limit of the test. Performed at [facility], [address], [place],  [address]   Culture, blood (routine x 2) Call MD if unable to obtain prior to antibiotics being given     Status: None   Collection Time: 11/20/20  7:52 AM   Specimen: BLOOD  Result Value Ref Range   Specimen Description BLOOD BLOOD RIGHT HAND    Special Requests      BOTTLES DRAWN AEROBIC  AND ANAEROBIC Blood Culture results may not be optimal due to an inadequate volume of blood received in culture bottles   Culture      NO GROWTH 5 DAYS Performed at [facility], [address] [place], [place] [address]    Report Status 11/25/2020 FINAL   Culture, blood (routine x 2) Call MD if unable to obtain prior to antibiotics being given     Status: None   Collection Time: 11/20/20  7:52 AM   Specimen: BLOOD  Result Value Ref Range   Specimen Description BLOOD BLOOD LEFT HAND    Special Requests      BOTTLES DRAWN AEROBIC AND ANAEROBIC Blood Culture results may not be optimal due to an inadequate volume of blood received in culture bottles   Culture      NO GROWTH 5 DAYS Performed at [facility], [address] [place], [place] [address]    Report Status 11/25/2020 FINAL   Resp Panel by RT-PCR (Flu A&B, Covid)     Status: None   Collection Time: 11/20/20 12:18 PM  Result Value Ref Range   SARS Coronavirus 2 by RT PCR NEGATIVE NEGATIVE    Comment: (NOTE) SARS-CoV-2 target nucleic acids are NOT DETECTED.  The SARS-CoV-2 RNA is generally detectable in upper respiratory specimens during the acute phase of infection. The lowest concentration of SARS-CoV-2 viral copies this assay can detect is 138 copies/mL. A negative result does not preclude SARS-Cov-2 infection and should not be used as the sole basis for treatment or other patient management decisions. A negative result may occur with  improper specimen collection/handling, submission of specimen other than nasopharyngeal swab, presence of viral mutation(s) within the areas targeted by this assay, and inadequate number of viral copies(<138 copies/mL). A negative result must be combined with clinical observations, patient history, and epidemiological information. The expected result is Negative.  Fact Sheet for Patients:  EntrepreneurPulse.com.au  Fact Sheet for Healthcare  Providers:  IncredibleEmployment.be  This test is no t yet approved or cleared by the [place] FDA and  has been authorized for detection and/or diagnosis of SARS-CoV-2 by FDA under an Emergency Use Authorization (EUA). This EUA will remain  in effect (meaning this test can be used) for the duration of the COVID-19 declaration under Section 564(b)(1) of the Act, 21 U.S.C.section 360bbb-3(b)(1), unless the authorization is terminated  or revoked sooner.       Influenza A by PCR NEGATIVE NEGATIVE   Influenza B by PCR NEGATIVE NEGATIVE    Comment: (NOTE) The Xpert Xpress SARS-CoV-2/FLU/RSV plus assay is intended as an aid in the diagnosis of influenza from Nasopharyngeal swab specimens and should not be used as a sole basis for treatment. Nasal washings and aspirates are unacceptable for Xpert Xpress SARS-CoV-2/FLU/RSV testing.  Fact Sheet for Patients: EntrepreneurPulse.com.au  Fact Sheet for Healthcare Providers: IncredibleEmployment.be  This test is not yet approved or cleared by the [place] FDA and has been authorized for detection and/or diagnosis of SARS-CoV-2 by FDA under an Emergency Use Authorization (EUA). This EUA will remain in effect (meaning this test can be used) for the duration of the COVID-19 declaration under Section 564(b)(1) of the Act, 21 U.S.C. section 360bbb-3(b)(1), unless the authorization is terminated or revoked.  Performed at [facility], [address] [place], [place] [address]   CBC with Differential/Platelet     Status: Abnormal   Collection Time: 11/21/20  5:35 AM  Result Value Ref Range   WBC 13.8 (H) 4.0 - 10.5 K/uL   RBC 3.03 (L) 3.87 - 5.11 MIL/uL   Hemoglobin 9.4 (L) 12.0 - 15.0 g/dL   HCT 27.5 (L) 36.0 - 46.0 %   MCV 90.8 80.0 - 100.0  fL   MCH 31.0 26.0 - 34.0 pg   MCHC 34.2 30.0 - 36.0 g/dL   RDW 14.1 11.5 - 15.5 %   Platelets 243 150 - 400 K/uL   nRBC  0.0 0.0 - 0.2 %   Neutrophils Relative % 89 %   Neutro Abs 12.4 (H) 1.7 - 7.7 K/uL   Lymphocytes Relative 6 %   Lymphs Abs 0.8 0.7 - 4.0 K/uL   Monocytes Relative 4 %   Monocytes Absolute 0.5 0.1 - 1.0 K/uL   Eosinophils Relative 0 %   Eosinophils Absolute 0.0 0.0 - 0.5 K/uL   Basophils Relative 0 %   Basophils Absolute 0.0 0.0 - 0.1 K/uL   Immature Granulocytes 1 %   Abs Immature Granulocytes 0.12 (H) 0.00 - 0.07 K/uL    Comment: Performed at [facility], [address] [place], [place] [address]  Comprehensive metabolic panel     Status: Abnormal   Collection Time: 11/21/20  5:35 AM  Result Value Ref Range   Sodium 142 135 - 145 mmol/L   Potassium 3.3 (L) 3.5 - 5.1 mmol/L   Chloride 107 98 - 111 mmol/L   CO2 25 22 - 32 mmol/L   Glucose, Bld 169 (H) 70 - 99 mg/dL    Comment: Glucose reference range applies only to samples taken after fasting for at least 8 hours.   BUN 21 8 - 23 mg/dL   Creatinine, Ser 0.83 0.44 - 1.00 mg/dL   Calcium 8.7 (L) 8.9 - 10.3 mg/dL   Total Protein 6.1 (L) 6.5 - 8.1 g/dL   Albumin 2.6 (L) 3.5 - 5.0 g/dL   AST 26 15 - 41 U/L   ALT 21 0 - 44 U/L   Alkaline Phosphatase 64 38 - 126 U/L   Total Bilirubin 0.5 0.3 - 1.2 mg/dL   GFR, Estimated >60 >60 mL/min    Comment: (NOTE) Calculated using the CKD-EPI Creatinine Equation (2021)    Anion gap 10 5 - 15    Comment: Performed at [facility], [address] [place], [place] [address]  Magnesium     Status: None   Collection Time: 11/21/20  5:35 AM  Result Value Ref Range   Magnesium 2.3 1.7 - 2.4 mg/dL    Comment: Performed at [facility], [address] [place], [place] [address]  Culture, sputum-assessment     Status: None   Collection Time: 11/21/20  2:15 PM   Specimen: Sputum  Result Value Ref Range   Specimen Description SPUTUM    Special Requests NONE    Sputum evaluation      Sputum specimen not acceptable for testing.  Please recollect.   NOTIFIED ASHTON  PETERS AT 1518 ON 11/21/20 BY SS Performed at [facility], [address] [place], [place] [address]    Report Status 11/21/2020 FINAL   CBC with Differential/Platelet     Status: Abnormal   Collection Time: 11/24/20  4:58 AM  Result Value Ref Range   WBC 7.9 4.0 - 10.5 K/uL   RBC 3.25 (L) 3.87 - 5.11 MIL/uL   Hemoglobin 9.6 (L) 12.0 - 15.0 g/dL   HCT 29.5 (L) 36.0 - 46.0 %   MCV 90.8 80.0 - 100.0 fL   MCH 29.5 26.0 - 34.0 pg   MCHC 32.5 30.0 - 36.0 g/dL   RDW 13.9 11.5 - 15.5 %   Platelets 312 150 - 400 K/uL   nRBC 0.0 0.0 - 0.2 %  Neutrophils Relative % 73 %   Neutro Abs 5.7 1.7 - 7.7 K/uL   Lymphocytes Relative 16 %   Lymphs Abs 1.3 0.7 - 4.0 K/uL   Monocytes Relative 9 %   Monocytes Absolute 0.7 0.1 - 1.0 K/uL   Eosinophils Relative 1 %   Eosinophils Absolute 0.1 0.0 - 0.5 K/uL   Basophils Relative 0 %   Basophils Absolute 0.0 0.0 - 0.1 K/uL   Immature Granulocytes 1 %   Abs Immature Granulocytes 0.09 (H) 0.00 - 0.07 K/uL    Comment: Performed at [facility], [address], [place], [place] [address]  Basic metabolic panel     Status: Abnormal   Collection Time: 11/24/20  4:58 AM  Result Value Ref Range   Sodium 137 135 - 145 mmol/L   Potassium 3.5 3.5 - 5.1 mmol/L   Chloride 100 98 - 111 mmol/L   CO2 28 22 - 32 mmol/L   Glucose, Bld 104 (H) 70 - 99 mg/dL    Comment: Glucose reference range applies only to samples taken after fasting for at least 8 hours.   BUN 21 8 - 23 mg/dL   Creatinine, Ser 0.94 0.44 - 1.00 mg/dL   Calcium 8.4 (L) 8.9 - 10.3 mg/dL   GFR, Estimated >60 >60 mL/min    Comment: (NOTE) Calculated using the CKD-EPI Creatinine Equation (2021)    Anion gap 9 5 - 15    Comment: Performed at [facility], [address] [place], [place] [address]  Magnesium     Status: Abnormal   Collection Time: 11/24/20  4:58 AM  Result Value Ref Range   Magnesium 2.5 (H) 1.7 - 2.4 mg/dL    Comment: Performed at [facility], [address] [place], [place] [address]      PHQ2/9: Depression screen [facility] 2/9 12/01/2020 11/18/2020 09/13/2020 09/13/2020 08/02/2020  Decreased Interest 0 0 0 0 0  Down, Depressed, Hopeless 0 0 0 0 0  PHQ - 2 Score 0 0 0 0 0  Altered sleeping - - 0 - -  Tired, decreased energy - - 1 - -  Change in appetite - - 0 - -  Feeling bad or failure about yourself  - - 0 - -  Trouble concentrating - - 0 - -  Moving slowly or fidgety/restless - - 0 - -  Suicidal thoughts - - 0 - -  PHQ-9 Score - - 1 - -  Difficult doing work/chores - - - - -  Some recent data might be hidden    phq 9 is negative   Fall Risk: Fall Risk  12/01/2020 11/18/2020 09/13/2020 08/02/2020 05/25/2020  Falls in the past year? 0 0 0 0 0  Number falls in past yr: 0 0 0 0 0  Injury with Fall? 0 0 0 0 0  Comment - - - - -  Risk for fall due to : - - - - No Fall Risks  Risk for fall due to: Comment - - - - -  Follow up - - - - Falls prevention discussed     Functional Status Survey: Is the patient deaf or have difficulty hearing?: No Does the patient have difficulty seeing, even when wearing glasses/contacts?: Yes Does the patient have difficulty concentrating, remembering, or making decisions?: No Does the patient have difficulty walking or climbing stairs?: Yes Does the patient have difficulty dressing or bathing?: No Does the patient have difficulty doing errands alone such as visiting  a doctor's office or shopping?: No    Assessment & Plan  1. Multifocal pneumonia  - DG Chest 2 View; Future  2. At high risk for aspiration  Following diet and recommendations given by Speech therapist   3. Anemia, unspecified type  - CBC with Differential/Platelet - Iron, TIBC and Ferritin Panel - BASIC METABOLIC PANEL WITH GFR  4. Physical deconditioning  Gradually improving  5. Hospital discharge follow-up  - CBC with Differential/Platelet - Iron, TIBC and Ferritin Panel - BASIC METABOLIC PANEL WITH  GFR

## 2020-12-01 ENCOUNTER — Ambulatory Visit (INDEPENDENT_AMBULATORY_CARE_PROVIDER_SITE_OTHER): Payer: Medicare Other | Admitting: Family Medicine

## 2020-12-01 ENCOUNTER — Encounter: Payer: Self-pay | Admitting: Family Medicine

## 2020-12-01 ENCOUNTER — Other Ambulatory Visit: Payer: Self-pay

## 2020-12-01 VITALS — BP 122/60 | HR 68 | Temp 98.1°F | Resp 16 | Ht 66.0 in | Wt 127.5 lb

## 2020-12-01 DIAGNOSIS — Z9189 Other specified personal risk factors, not elsewhere classified: Secondary | ICD-10-CM | POA: Diagnosis not present

## 2020-12-01 DIAGNOSIS — R5381 Other malaise: Secondary | ICD-10-CM | POA: Diagnosis not present

## 2020-12-01 DIAGNOSIS — Z09 Encounter for follow-up examination after completed treatment for conditions other than malignant neoplasm: Secondary | ICD-10-CM

## 2020-12-01 DIAGNOSIS — D649 Anemia, unspecified: Secondary | ICD-10-CM | POA: Diagnosis not present

## 2020-12-01 DIAGNOSIS — J189 Pneumonia, unspecified organism: Secondary | ICD-10-CM

## 2020-12-01 NOTE — Patient Instructions (Signed)
Go to imaging center across from [facility] around March 16 th for repeat CXR

## 2020-12-02 LAB — CBC WITH DIFFERENTIAL/PLATELET
Absolute Monocytes: 787 cells/uL (ref 200–950)
Basophils Absolute: 29 cells/uL (ref 0–200)
Basophils Relative: 0.3 %
Eosinophils Absolute: 77 cells/uL (ref 15–500)
Eosinophils Relative: 0.8 %
HCT: 34.3 % — ABNORMAL LOW (ref 35.0–45.0)
Hemoglobin: 11.2 g/dL — ABNORMAL LOW (ref 11.7–15.5)
Lymphs Abs: 1315 cells/uL (ref 850–3900)
MCH: 30.1 pg (ref 27.0–33.0)
MCHC: 32.7 g/dL (ref 32.0–36.0)
MCV: 92.2 fL (ref 80.0–100.0)
MPV: 9.2 fL (ref 7.5–12.5)
Monocytes Relative: 8.2 %
Neutro Abs: 7392 cells/uL (ref 1500–7800)
Neutrophils Relative %: 77 %
Platelets: 312 10*3/uL (ref 140–400)
RBC: 3.72 10*6/uL — ABNORMAL LOW (ref 3.80–5.10)
RDW: 14.2 % (ref 11.0–15.0)
Total Lymphocyte: 13.7 %
WBC: 9.6 10*3/uL (ref 3.8–10.8)

## 2020-12-02 LAB — IRON,TIBC AND FERRITIN PANEL
%SAT: 22 % (calc) (ref 16–45)
Ferritin: 104 ng/mL (ref 16–288)
Iron: 58 ug/dL (ref 45–160)
TIBC: 266 mcg/dL (calc) (ref 250–450)

## 2020-12-02 LAB — BASIC METABOLIC PANEL WITH GFR
BUN: 17 mg/dL (ref 7–25)
CO2: 26 mmol/L (ref 20–32)
Calcium: 9 mg/dL (ref 8.6–10.4)
Chloride: 103 mmol/L (ref 98–110)
Creat: 0.89 mg/dL (ref 0.60–0.93)
GFR, Est African American: 72 mL/min/{1.73_m2} (ref >=60)
GFR, Est Non African American: 62 mL/min/{1.73_m2} (ref >=60)
Glucose, Bld: 95 mg/dL (ref 65–99)
Potassium: 4.6 mmol/L (ref 3.5–5.3)
Sodium: 140 mmol/L (ref 135–146)

## 2020-12-09 ENCOUNTER — Other Ambulatory Visit: Payer: Self-pay | Admitting: Family Medicine

## 2020-12-09 DIAGNOSIS — F5105 Insomnia due to other mental disorder: Secondary | ICD-10-CM

## 2020-12-09 DIAGNOSIS — F409 Phobic anxiety disorder, unspecified: Secondary | ICD-10-CM

## 2020-12-09 NOTE — Telephone Encounter (Signed)
Requested medication (s) are due for refill today: yes  Requested medication (s) are on the active medication list: yes  Last refill:  09/13/20 #30 0 refill  Future visit scheduled: yes in 3 weeks  Notes to clinic:  not delegated per protocol     Requested Prescriptions  Pending Prescriptions Disp Refills   temazepam (RESTORIL) 15 MG capsule [Pharmacy Med Name: TEMAZEPAM 15MG  CAPSULES] 30 capsule     Sig: TAKE 1 CAPSULE(15 MG) BY MOUTH AT BEDTIME AS NEEDED FOR SLEEP      Not Delegated - Psychiatry:  Anxiolytics/Hypnotics Failed - 12/09/2020 10:38 AM      Failed - This refill cannot be delegated      Failed - Urine Drug Screen completed in last 360 days      Passed - Valid encounter within last 6 months    Recent Outpatient Visits           1 week ago Multifocal pneumonia   [facility] Steele Sizer, MD   3 weeks ago Cough   [facility] Steele Sizer, MD   2 months ago GAD (generalized anxiety disorder)   [facility] Steele Sizer, MD   4 months ago Viral upper respiratory tract infection   [facility] Steele Sizer, MD   8 months ago Hospital discharge follow-up   [facility] Steele Sizer, MD       Future Appointments             In 3 months Ancil Boozer, Drue Stager, MD [facility], [place]   In 5 months  [facility], [facility]

## 2020-12-23 DIAGNOSIS — I10 Essential (primary) hypertension: Secondary | ICD-10-CM | POA: Diagnosis not present

## 2020-12-23 DIAGNOSIS — M5126 Other intervertebral disc displacement, lumbar region: Secondary | ICD-10-CM | POA: Diagnosis not present

## 2020-12-23 DIAGNOSIS — M47812 Spondylosis without myelopathy or radiculopathy, cervical region: Secondary | ICD-10-CM | POA: Diagnosis not present

## 2020-12-24 ENCOUNTER — Other Ambulatory Visit: Payer: Self-pay | Admitting: Neurosurgery

## 2020-12-24 ENCOUNTER — Other Ambulatory Visit (HOSPITAL_COMMUNITY): Payer: Self-pay | Admitting: Neurosurgery

## 2020-12-24 DIAGNOSIS — M47812 Spondylosis without myelopathy or radiculopathy, cervical region: Secondary | ICD-10-CM

## 2020-12-24 DIAGNOSIS — M5126 Other intervertebral disc displacement, lumbar region: Secondary | ICD-10-CM

## 2020-12-29 ENCOUNTER — Telehealth: Payer: Self-pay

## 2020-12-29 NOTE — Progress Notes (Signed)
Chronic Care Management Pharmacy Assistant   Name: [name]  MRN: [id number] DOB: 03-21-1941   Reason for Encounter:Hypertension Disease State  Call.   Conditions to be addressed/monitored: HTN  Primary concerns for visit include: Hypertension    Recent office visits:  11/18/2020 PCP Steele Sizer   12/01/2020 PCP Steele Sizer   Recent consult visits:  No Recent [facility] visits:  Medication Reconciliation was completed by comparing discharge summary, patients EMR and Pharmacy list, and upon discussion with patient.  Admitted to the hospital on 11/19/2020 due to Acute respiratory failure with hypoemia. Discharge date was 12/14/2020. Discharged from [facility] center. [place]?Medications Started at [facility] Discharge:?? -started IV Levaquin, then transitioned to oral Levaquin for 2 more days to complete 7-day course  Medication Changes at Hospital Discharge: -None ID  Medications Discontinued at [place] ID  Medications that remain the same after Hospital Discharge:??  -All other medications will remain the same.    Medications: Outpatient Encounter Medications as of 12/29/2020  Medication Sig   acetaminophen (TYLENOL) 650 MG CR tablet Take 650-1,300 mg by mouth every 8 (eight) hours as needed for pain.    albuterol (VENTOLIN HFA) 108 (90 Base) MCG/ACT inhaler Inhale 2 puffs into the lungs every 6 (six) hours as needed for wheezing or shortness of breath.   amiodarone (PACERONE) 200 MG tablet TAKE 1 TABLET(200 MG) BY MOUTH DAILY   busPIRone (BUSPAR) 5 MG tablet Take 1 tablet (5 mg total) by mouth 2 (two) times daily.   cholecalciferol (VITAMIN D) 1000 UNITS tablet Take 2,000 Units by mouth daily.   Cranberry 500 MG TABS Take 2 tablets by mouth daily.   doxazosin (CARDURA) 1 MG tablet Take 1 tablet (1 mg total) by mouth every evening.   ELIQUIS 5 MG TABS tablet TAKE 1 TABLET(5 MG) BY MOUTH TWICE  DAILY   furosemide (LASIX) 20 MG tablet Take 1 tablet (20 mg total) by mouth See admin instructions. Take 1 tablet (20mg ) by mouth every morning - take 1 additional tablet (20mg ) after lunch if needed for swelling   levothyroxine (SYNTHROID) 137 MCG tablet TAKE 1 TABLET(137 MCG) BY MOUTH EVERY MORNING   metoprolol succinate (TOPROL-XL) 50 MG 24 hr tablet Take with or immediately following a meal.   Multiple Vitamin (MULITIVITAMIN WITH MINERALS) TABS Take 1 tablet by mouth daily.   potassium chloride (KLOR-CON) 10 MEQ tablet TAKE 1 TABLET BY MOUTH EVERY DAY AS DIRECTED. MAY TAKE AN EXTRA PILL AFTER LUNCH WITH FLUID PILL AS NEEDED FOR SWELLING   rosuvastatin (CRESTOR) 40 MG tablet TAKE 1 TABLET(40 MG) BY MOUTH DAILY   temazepam (RESTORIL) 15 MG capsule TAKE 1 CAPSULE(15 MG) BY MOUTH AT BEDTIME AS NEEDED FOR SLEEP   valsartan (DIOVAN) 160 MG tablet Take 1 tablet (160 mg total) by mouth daily. Take in the morning   No facility-administered encounter medications on file as of 12/29/2020.      Star Rating Drugs: rosuvastatin,valsartan  Reviewed chart prior to disease state call. Spoke with patient regarding BP  Recent Office Vitals: BP Readings from Last 3 Encounters:  12/01/20 122/60  11/24/20 (!) 121/55  11/18/20 (!) 139/57   Pulse Readings from Last 3 Encounters:  12/01/20 68  11/24/20 78  11/18/20 67    Wt Readings from Last 3 Encounters:  12/01/20 127 lb 8 oz (57.8 kg)  11/24/20 123 lb 7.3 oz (56 kg)  11/05/20 130 lb (59 kg)  Kidney Function Lab Results  Component Value Date/Time   CREATININE 0.89 12/01/2020 04:18 PM   CREATININE 0.94 11/24/2020 04:58 AM   CREATININE 0.83 11/21/2020 05:35 AM   CREATININE 1.14 (H) 09/13/2020 12:00 AM   GFRNONAA 62 12/01/2020 04:18 PM   GFRAA 72 12/01/2020 04:18 PM    BMP Latest Ref Rng & Units 12/01/2020 11/24/2020 11/21/2020  Glucose 65 - 99 mg/dL 95 104(H) 169(H)  BUN 7 - 25 mg/dL 17 21 21   Creatinine 0.60 - 0.93 mg/dL 0.89 0.94  0.83  BUN/Creat Ratio 6 - 22 (calc) NOT APPLICABLE - -  Sodium 103 - 146 mmol/L 140 137 142  Potassium 3.5 - 5.3 mmol/L 4.6 3.5 3.3(L)  Chloride 98 - 110 mmol/L 103 100 107  CO2 20 - 32 mmol/L 26 28 25   Calcium 8.6 - 10.4 mg/dL 9.0 8.4(L) 8.7(L)     Current antihypertensive regimen:  ? Valsartan 160 mg one a day  ? Cardura 1 mg one tablet  ? Furosemide 20 mg one a day  ? Metoprolol Succinate 50 mg daily    How often are you checking your Blood Pressure? twice daily  Current home BP readings:  o Patient states her blood pressure ranges around 130/50-60. o Patient states she reported to her provdier , there were some mornings her blood pressure would be low and the provider told her she may be dehydrated and to drink some water at night.Patient states she drinks a big glass of water and her blood pressure improved.   What recent interventions/DTPs have been made by any provider to improve Blood Pressure control since last CPP Visit: None ID   Any recent hospitalizations or ED visits since last visit with CPP? Yes  What diet changes have been made to improve Blood Pressure Control?  o Patient states she tries to follow a heart healthy diet.  What exercise is being done to improve your Blood Pressure Control?  o Patient states she does not walk as much as she use to due to back and neck pain and  having pneumonia twice in the past two months . Patient reports she is feeling better then before.  Adherence Review: Is the patient currently on ACE/ARB medication? Yes Does the patient have >5 day gap between last estimated fill dates? No   Bessie D [place] Pharmacist Assistant [phone]

## 2021-01-01 ENCOUNTER — Telehealth: Payer: Self-pay | Admitting: Family Medicine

## 2021-01-01 ENCOUNTER — Other Ambulatory Visit: Payer: Self-pay | Admitting: Cardiovascular Disease

## 2021-01-01 DIAGNOSIS — E785 Hyperlipidemia, unspecified: Secondary | ICD-10-CM

## 2021-01-03 ENCOUNTER — Other Ambulatory Visit: Payer: Self-pay

## 2021-01-03 DIAGNOSIS — E785 Hyperlipidemia, unspecified: Secondary | ICD-10-CM

## 2021-01-03 MED ORDER — ROSUVASTATIN CALCIUM 40 MG PO TABS
ORAL_TABLET | ORAL | 3 refills | Status: DC
Start: 2021-01-03 — End: 2021-12-28

## 2021-01-03 NOTE — Addendum Note (Signed)
Addended by: Steele Sizer F on: 01/03/2021 01:27 PM   Modules accepted: Orders

## 2021-01-03 NOTE — Telephone Encounter (Signed)
Pt called and is requesting to have PCP refill this. Pt states that she contacted Dr. Donivan Scull office and they asked her to contact PCP to have this refilled. Please advise.

## 2021-01-07 ENCOUNTER — Ambulatory Visit
Admission: RE | Admit: 2021-01-07 | Discharge: 2021-01-07 | Disposition: A | Discharge status: Home / Self Care | Payer: Medicare Other | Source: Ambulatory Visit | Attending: Neurosurgery | Admitting: Neurosurgery

## 2021-01-07 ENCOUNTER — Other Ambulatory Visit: Payer: Self-pay

## 2021-01-07 DIAGNOSIS — M4802 Spinal stenosis, cervical region: Secondary | ICD-10-CM | POA: Diagnosis not present

## 2021-01-07 DIAGNOSIS — M5124 Other intervertebral disc displacement, thoracic region: Secondary | ICD-10-CM | POA: Diagnosis not present

## 2021-01-07 DIAGNOSIS — M47812 Spondylosis without myelopathy or radiculopathy, cervical region: Secondary | ICD-10-CM | POA: Insufficient documentation

## 2021-01-07 DIAGNOSIS — M545 Low back pain, unspecified: Secondary | ICD-10-CM | POA: Diagnosis not present

## 2021-01-07 DIAGNOSIS — M50223 Other cervical disc displacement at C6-C7 level: Secondary | ICD-10-CM | POA: Diagnosis not present

## 2021-01-07 DIAGNOSIS — M5126 Other intervertebral disc displacement, lumbar region: Secondary | ICD-10-CM | POA: Insufficient documentation

## 2021-01-10 DIAGNOSIS — M47892 Other spondylosis, cervical region: Secondary | ICD-10-CM | POA: Diagnosis not present

## 2021-01-10 DIAGNOSIS — M5126 Other intervertebral disc displacement, lumbar region: Secondary | ICD-10-CM | POA: Diagnosis not present

## 2021-01-20 DIAGNOSIS — M47812 Spondylosis without myelopathy or radiculopathy, cervical region: Secondary | ICD-10-CM | POA: Diagnosis not present

## 2021-01-20 DIAGNOSIS — M47816 Spondylosis without myelopathy or radiculopathy, lumbar region: Secondary | ICD-10-CM | POA: Diagnosis not present

## 2021-01-24 ENCOUNTER — Other Ambulatory Visit: Payer: Self-pay | Admitting: Family Medicine

## 2021-01-24 DIAGNOSIS — F411 Generalized anxiety disorder: Secondary | ICD-10-CM

## 2021-01-25 DIAGNOSIS — M47816 Spondylosis without myelopathy or radiculopathy, lumbar region: Secondary | ICD-10-CM | POA: Diagnosis not present

## 2021-01-25 DIAGNOSIS — I10 Essential (primary) hypertension: Secondary | ICD-10-CM | POA: Diagnosis not present

## 2021-02-10 DIAGNOSIS — M47816 Spondylosis without myelopathy or radiculopathy, lumbar region: Secondary | ICD-10-CM | POA: Diagnosis not present

## 2021-02-10 DIAGNOSIS — M47812 Spondylosis without myelopathy or radiculopathy, cervical region: Secondary | ICD-10-CM | POA: Diagnosis not present

## 2021-02-15 ENCOUNTER — Telehealth: Payer: Self-pay | Admitting: Cardiovascular Disease

## 2021-02-15 DIAGNOSIS — I482 Chronic atrial fibrillation, unspecified: Secondary | ICD-10-CM

## 2021-02-15 NOTE — Telephone Encounter (Signed)
   [facility] Pre-operative Risk Assessment    Patient Name: [name]  DOB: Sep 18, 1941  MRN: [id number]   HEARTCARE STAFF: - Please ensure there is not already an duplicate clearance open for this procedure. - Under Visit Info/Reason for Call, type in Other and utilize the format Clearance MM/DD/YY or Clearance TBD. Do not use dashes or single digits. - If request is for dental extraction, please clarify the # of teeth to be extracted.  Request for surgical clearance:  1. What type of surgery is being performed? Spinal Injection   2. When is this surgery scheduled? TBD  3. What type of clearance is required (medical clearance vs. Pharmacy clearance to hold med vs. Both)? both  4. Are there any medications that need to be held prior to surgery and how long? Eliquis instructions - needs to discontinue for 3-5 days    5. Practice name and name of physician performing surgery? [facility] -   6. What is the office phone number? [phone]   7.   What is the office fax number? [phone]  8.   Anesthesia type (None, local, MAC, general) ? Not listed    Ace Gins 02/15/2021, 4:40 PM  _________________________________________________________________   (provider comments below)

## 2021-02-16 MED ORDER — ELIQUIS 5 MG PO TABS: ORAL_TABLET | ORAL | 5 refills | Status: DC

## 2021-02-16 NOTE — Telephone Encounter (Signed)
   Name: [name]  DOB: [birth year]  MRN: [id number]   Primary Cardiologist: Ida Rogue, MD  Chart reviewed as part of pre-operative protocol coverage. Patient was contacted 02/16/2021 in reference to pre-operative risk assessment for pending surgery as outlined below.  [name] was last seen on 11/05/2020 by Dr. Rockey Situ.  Since that day, [name] has done well without chest pain or worsening dyspnea.  Patient was admitted with multifocal pneumonia and respiratory failure in late January 2022, however she has recovered her sense.  EKG obtained in the hospital at the time showed sinus rhythm with PACs.  Therefore, based on ACC/AHA guidelines, the patient would be at acceptable risk for the planned procedure without further cardiovascular testing.   Patient has been instructed to hold Eliquis for 3 days prior to the procedure and restart as soon as possible afterward at the surgeon's discretion.  The patient was advised that if she develops new symptoms prior to surgery to contact our office to arrange for a follow-up visit, and she verbalized understanding.  I will route this recommendation to the requesting party via Epic fax function and remove from pre-op pool. Please call with questions.  [place], [place] 02/16/2021, 1:00 PM

## 2021-02-16 NOTE — Telephone Encounter (Signed)
Patient with diagnosis of afib on Eliquis for anticoagulation.    Procedure: spinal injection Date of procedure: TBD  CHA2DS2-VASc Score = 6  This indicates a 9.7% annual risk of stroke. The patient's score is based upon: CHF History: Yes HTN History: Yes Diabetes History: No Stroke History: No Vascular Disease History: Yes Age Score: 2 Gender Score: 1   CrCl 82mL/min Platelet count 312K  Per office protocol, patient can hold Eliquis for 3 days prior to procedure.

## 2021-02-16 NOTE — Telephone Encounter (Signed)
Clinical pharmacist to review Eliquis 

## 2021-02-22 DIAGNOSIS — M47812 Spondylosis without myelopathy or radiculopathy, cervical region: Secondary | ICD-10-CM | POA: Diagnosis not present

## 2021-02-22 DIAGNOSIS — I10 Essential (primary) hypertension: Secondary | ICD-10-CM | POA: Diagnosis not present

## 2021-03-07 ENCOUNTER — Telehealth: Payer: Self-pay | Admitting: Cardiovascular Disease

## 2021-03-07 NOTE — Telephone Encounter (Signed)
   [facility] Pre-operative Risk Assessment    Patient Name: [name]  DOB: Nov 28, 1940  MRN: [id number]   HEARTCARE STAFF: - Please ensure there is not already an duplicate clearance open for this procedure. - Under Visit Info/Reason for Call, type in Other and utilize the format Clearance MM/DD/YY or Clearance TBD. Do not use dashes or single digits. - If request is for dental extraction, please clarify the # of teeth to be extracted.  Request for surgical clearance:  1. What type of surgery is being performed? SPINAL INJECTION  2. When is this surgery scheduled? TBD  3. What type of clearance is required (medical clearance vs. Pharmacy clearance to hold med vs. Both)? PHARMACY  4. Are there any medications that need to be held prior to surgery and how long? ELIQUIS  5. Practice name and name of physician performing surgery? DR Marlaine Hind [facility].    7. What is the office phone number? [phone]   7.   What is the office fax number? [phone]  8.   Anesthesia type (None, local, MAC, general) ? NOT LISTED   Elissa Hefty 03/07/2021, 11:27 AM  _________________________________________________________________   (provider comments below)

## 2021-03-07 NOTE — Telephone Encounter (Signed)
   Name: [name]  DOB: 12-Sep-1941  MRN: [id number]   Primary Cardiologist: Ida Rogue, MD  Chart reviewed as part of pre-operative protocol coverage. Patient was contacted 03/07/2021 in reference to pre-operative risk assessment for pending surgery as outlined below.  [name] was last seen on 11/05/20 by Dr. Rockey Situ.  She was hospitalized in Jan 2022 with PNA, but has recovered. She is primarily limited by back pain, but can still achieve 4.0 METS (can climb a flight of stairs). She does not have a history of obstructive CAD.   Per recent recommendations, she may hold eliquis for 3 days prior to spinal injection. Restart as soon after when safe.  Therefore, based on ACC/AHA guidelines, the patient would be at acceptable risk for the planned procedure without further cardiovascular testing.   The patient was advised that if she develops new symptoms prior to surgery to contact our office to arrange for a follow-up visit, and she verbalized understanding.  I will route this recommendation to the requesting party via Epic fax function and remove from pre-op pool. Please call with questions.  [place], PA 03/07/2021, 1:24 PM

## 2021-03-08 ENCOUNTER — Telehealth: Payer: Self-pay

## 2021-03-09 ENCOUNTER — Telehealth: Payer: Self-pay

## 2021-03-09 NOTE — Progress Notes (Signed)
Spoke to patient to confirmed patient telephone appointment on 03/10/2021 for CCM at 10:00 am with Junius Argyle the Clinical pharmacist.    Patient Verbalized understanding and denies any side effects from any of her current medications  Patient states to not call before 10:00 am but at 10:00 am or after is good for her.  [place] Pharmacist Assistant [phone]

## 2021-03-10 ENCOUNTER — Ambulatory Visit (INDEPENDENT_AMBULATORY_CARE_PROVIDER_SITE_OTHER): Payer: Medicare Other

## 2021-03-10 DIAGNOSIS — I10 Essential (primary) hypertension: Secondary | ICD-10-CM | POA: Diagnosis not present

## 2021-03-10 DIAGNOSIS — E785 Hyperlipidemia, unspecified: Secondary | ICD-10-CM | POA: Diagnosis not present

## 2021-03-10 DIAGNOSIS — F411 Generalized anxiety disorder: Secondary | ICD-10-CM

## 2021-03-10 DIAGNOSIS — I5032 Chronic diastolic (congestive) heart failure: Secondary | ICD-10-CM | POA: Diagnosis not present

## 2021-03-10 DIAGNOSIS — I482 Chronic atrial fibrillation, unspecified: Secondary | ICD-10-CM

## 2021-03-10 NOTE — Patient Instructions (Signed)
Visit Information It was great speaking with you today!  Please let me know if you have any questions about our visit.  Goals Addressed            This Visit's Progress   . Track and Manage Fluids and Swelling-Heart Failure       Timeframe:  Long-Range Goal Priority:  High Start Date:  03/10/2021                            Expected End Date: 09/10/2021                       Follow Up Date 05/21/2021   - call office if I gain more than 2 pounds in one day or 5 pounds in one week - keep legs up while sitting - track weight in diary - use salt in moderation - watch for swelling in feet, ankles and legs every day    Why is this important?    It is important to check your weight daily and watch how much salt and liquids you have.   It will help you to manage your heart failure.    Notes:        Patient Care Plan: General Pharmacy (Adult)    Problem Identified: Hypertension, Hyperlipidemia, Atrial Fibrillation, Heart Failure, Coronary Artery Disease, Chronic Kidney Disease, Hypothyroidism, Depression, Anxiety and Peripheral Artery Disease   Priority: High    Long-Range Goal: Patient-Specific Goal   Start Date: 03/10/2021  This Visit's Progress: On track  Priority: High  Note:   Current Barriers:  . No barriers noted  Pharmacist Clinical Goal(s):  Marland Kitchen Patient will maintain control of blood pressure as evidenced by BP less than 140/90  through collaboration with PharmD and provider.   Interventions: . 1:1 collaboration with Steele Sizer, MD regarding development and update of comprehensive plan of care as evidenced by provider attestation and co-signature . Inter-disciplinary care team collaboration (see longitudinal plan of care) . Comprehensive medication review performed; medication list updated in electronic medical record  Heart Failure (Goal: manage symptoms and prevent exacerbations) -Controlled -Last ejection fraction: 60-65% (Date: 10/31/2020) -HF type:  Diastolic -NYHA Class: II (slight limitation of activity) -AHA HF Stage: C (Heart disease and symptoms present) -Current treatment: . Doxazosin 1 mg nightly  . Furosemide 20 mg daily  . Metoprolol XL 50 mg daily  . Valsartan 160 mg daily  -Medications previously tried: NA  -Current home BP/HR readings: 130s/50-60s -Educated on Importance of blood pressure control -Recommended to continue current medication  Atrial Fibrillation (Goal: prevent stroke and major bleeding) -Controlled -CHADSVASC: 6 -Current treatment: . Rate/Rhthym control:  o Amiodarone 200 mg daily  o Metoprolol XL 50 mg daily  . Anticoagulation: Eliquis 5 mg twice daily  -Medications previously tried: NA -Patient reports she has had a few instances of A-Fib, often associated with increased activity.  -Counseled on avoidance of NSAIDs due to increased bleeding risk with anticoagulants; -Recommended to continue current medication  Hyperlipidemia: (LDL goal < 70) -Controlled -Current treatment: . Rosuvastatin 40 mg daily  -Medications previously tried: NA  -Educated on Importance of limiting foods high in cholesterol; -Recommended to continue current medication  Depression/Anxiety (Goal: Maintain stable mood) -Controlled -Current treatment: . Buspirone 5 mg twice daily  . Temazepam 15 mg nightly as needed - Sleep  -Medications previously tried/failed: NA -PHQ9: 0 -GAD7: 0 -Patient reports her sleep quality as very good. Does need  to take temazepam every other night, but denies any lingering sedation or side effects from treatment. -Educated on Benefits of medication for symptom control -Recommended to continue current medication  Hypothyroidism (Goal: Maintain stable thyroid functon) -Controlled -Current treatment  . Levothyroxine 137 mcg daily  -Medications previously tried: NA -Patient reports concerns with hair loss, wonders if it could be an iron deficiency   separates from other medications and  food  -Recommended to continue current medication Will plan to have PCP recheck thyroid and iron function at next follow-up.  Could consider biotin or B complex supplementation.   Patient Goals/Self-Care Activities . Patient will:  - check blood pressure 2-3 times weekly, document, and provide at future appointments  Follow Up Plan: Telephone follow up appointment with care management team member scheduled for:  08/24/2021 at 10:00 AM      Patient agreed to services and verbal consent obtained.   The patient verbalized understanding of instructions, educational materials, and care plan provided today and declined offer to receive copy of patient instructions, educational materials, and care plan.   Doristine Section, [facility] [phone]

## 2021-03-10 NOTE — Progress Notes (Signed)
Chronic Care Management Pharmacy Note  03/10/2021 Name:  [name] MRN:  [id number] DOB:  1941-08-21  Subjective: [name] is an 80 y.o. year old female who is a primary patient of Steele Sizer, MD.  The CCM team was consulted for assistance with disease management and care coordination needs.    Engaged with patient by telephone for follow up visit in response to provider referral for pharmacy case management and/or care coordination services.   Consent to Services:  The patient was given information about Chronic Care Management services, agreed to services, and gave verbal consent prior to initiation of services.  Please see initial visit note for detailed documentation.   Patient Care Team: Steele Sizer, MD as PCP - General (Family Medicine) Minna Merritts, MD as PCP - Cardiology (Cardiology) Dingeldein, Remo Lipps, MD as Consulting Physician (Ophthalmology) Alisa Graff, FNP as Consulting Physician (Family Medicine) Jannet Mantis, MD (Dermatology) Minna Merritts, MD as Consulting Physician (Cardiology) Ashok Pall, MD as Consulting Physician (Neurosurgery) Marlaine Hind, MD as Consulting Physician (Physical Medicine and Rehabilitation)  Recent office visits: 12/01/20: Patient presented to Dr. Ancil Boozer for hospital follow-up.  11/18/20: Patient presented to Dr. Ancil Boozer for cough. No medication changes made.   09/13/20: Patient presented to Dr. Ancil Boozer for follow-up. TSH 5.09. Magic mouthwash discontinued.   Recent consult visits: 11/05/20: Patient presented to Dr. Rockey Situ (Cardiology) for follow-up. Valsartan 160 mg daily started. Doxazosin decreased to 1 mg nightly.   Hospital visits: 1/28-11/24/20: Patient hospitalized for CAP. Patient discharged on levofloxacin x 2 days.  1/8-1/11/22: Patient hospitalized for CAP. Patient discharged on prednisone x2 additional days + levofloxacin x 2 days.    Objective:  Lab Results  Component Value Date    CREATININE 0.89 12/01/2020   BUN 17 12/01/2020   GFRNONAA 62 12/01/2020   GFRAA 72 12/01/2020   NA 140 12/01/2020   K 4.6 12/01/2020   CALCIUM 9.0 12/01/2020   CO2 26 12/01/2020   GLUCOSE 95 12/01/2020    Lab Results  Component Value Date/Time   HGBA1C 5.2 09/13/2020 12:00 AM   HGBA1C 5.0 03/30/2020 04:12 AM    Last diabetic Eye exam: No results found for: HMDIABEYEEXA  Last diabetic Foot exam: No results found for: HMDIABFOOTEX   Lab Results  Component Value Date   CHOL 134 09/13/2020   HDL 56 09/13/2020   LDLCALC 53 09/13/2020   TRIG 54 10/30/2020   CHOLHDL 2.4 09/13/2020    Hepatic Function Latest Ref Rng & Units 11/21/2020 11/19/2020 11/18/2020  Total Protein 6.5 - 8.1 g/dL 6.1(L) 7.5 7.4  Albumin 3.5 - 5.0 g/dL 2.6(L) 3.0(L) 3.3(L)  AST 15 - 41 U/L 26 38 25  ALT 0 - 44 U/L 21 29 18   Alk Phosphatase 38 - 126 U/L 64 86 80  Total Bilirubin 0.3 - 1.2 mg/dL 0.5 0.7 0.6  Bilirubin, Direct 0.0 - 0.2 mg/dL - - -    Lab Results  Component Value Date/Time   TSH 0.852 10/30/2020 04:03 AM   TSH 5.09 (H) 09/13/2020 12:00 AM   TSH 1.20 06/02/2019 08:36 AM   FREET4 1.2 01/10/2017 08:25 AM   FREET4 1.51 07/22/2015 09:09 AM    CBC Latest Ref Rng & Units 12/01/2020 11/24/2020 11/21/2020  WBC 3.8 - 10.8 Thousand/uL 9.6 7.9 13.8(H)  Hemoglobin 11.7 - 15.5 g/dL 11.2(L) 9.6(L) 9.4(L)  Hematocrit 35.0 - 45.0 % 34.3(L) 29.5(L) 27.5(L)  Platelets 140 - 400 Thousand/uL 312 312 243    Lab Results  Component Value Date/Time   VD25OH 46 05/17/2017 11:43 AM    Clinical ASCVD: Yes  The 10-year ASCVD risk score Mikey Bussing DC Jr., et al., 2013) is: 26.3%   Values used to calculate the score:     Age: 28 years     Sex: Female     Is Non-Hispanic African American: No     Diabetic: No     Tobacco smoker: No     Systolic Blood Pressure: 528 mmHg     Is BP treated: Yes     HDL Cholesterol: 56 mg/dL     Total Cholesterol: 134 mg/dL    Depression screen [facility] 2/9 12/01/2020 11/18/2020 09/13/2020   Decreased Interest 0 0 0  Down, Depressed, Hopeless 0 0 0  PHQ - 2 Score 0 0 0  Altered sleeping - - 0  Tired, decreased energy - - 1  Change in appetite - - 0  Feeling bad or failure about yourself  - - 0  Trouble concentrating - - 0  Moving slowly or fidgety/restless - - 0  Suicidal thoughts - - 0  PHQ-9 Score - - 1  Difficult doing work/chores - - -  Some recent data might be hidden     Social History   Tobacco Use  Smoking Status Never Smoker  Smokeless Tobacco Never Used  Tobacco Comment   smoking cessation materials not required   BP Readings from Last 3 Encounters:  12/01/20 122/60  11/24/20 (!) 121/55  11/18/20 (!) 139/57   Pulse Readings from Last 3 Encounters:  12/01/20 68  11/24/20 78  11/18/20 67   Wt Readings from Last 3 Encounters:  12/01/20 127 lb 8 oz (57.8 kg)  11/24/20 123 lb 7.3 oz (56 kg)  11/05/20 130 lb (59 kg)   BMI Readings from Last 3 Encounters:  12/01/20 20.58 kg/m  11/24/20 19.93 kg/m  11/05/20 20.98 kg/m    Assessment/Interventions: Review of patient past medical history, allergies, medications, health status, including review of consultants reports, laboratory and other test data, was performed as part of comprehensive evaluation and provision of chronic care management services.   SDOH:  (Social Determinants of Health) assessments and interventions performed: Yes SDOH Interventions   Flowsheet Row Most Recent Value  SDOH Interventions   Financial Strain Interventions Intervention Not Indicated     SDOH Screenings   Alcohol Screen: Low Risk   . Last Alcohol Screening Score (AUDIT): 0  Depression (PHQ2-9): Low Risk   . PHQ-2 Score: 0  Financial Resource Strain: Low Risk   . Difficulty of Paying Living Expenses: Not hard at all  Food Insecurity: No Food Insecurity  . Worried About Charity fundraiser in the Last Year: Never true  . Ran Out of Food in the Last Year: Never true  Housing: Low Risk   . Last Housing Risk  Score: 0  Physical Activity: Sufficiently Active  . Days of Exercise per Week: 6 days  . Minutes of Exercise per Session: 30 min  Social Connections: Moderately Integrated  . Frequency of Communication with Friends and Family: More than three times a week  . Frequency of Social Gatherings with Friends and Family: More than three times a week  . Attends Religious Services: More than 4 times per year  . Active Member of Clubs or Organizations: Yes  . Attends Archivist Meetings: More than 4 times per year  . Marital Status: Widowed  Stress: No Stress Concern Present  . Feeling of Stress :  Not at all  Tobacco Use: Low Risk   . Smoking Tobacco Use: Never Smoker  . Smokeless Tobacco Use: Never Used  Transportation Needs: No Transportation Needs  . Lack of Transportation (Medical): No  . Lack of Transportation (Non-Medical): No    CCM Care Plan  Allergies  Allergen Reactions  . Chocolate Swelling  . Penicillins Swelling and Rash    Has patient had a PCN reaction causing immediate rash, facial/tongue/throat swelling, SOB or lightheadedness with hypotension: Yes Has patient had a PCN reaction causing severe rash involving mucus membranes or skin necrosis: No Has patient had a PCN reaction that required hospitalization: No Has patient had a PCN reaction occurring within the last 10 years: No If all of the above answers are "NO", then may proceed with Cephalosporin use.   . Codeine Nausea And Vomiting  . Erythromycin Itching and Swelling  . Septra [Sulfamethoxazole-Trimethoprim] Swelling    Medications Reviewed Today    Reviewed by Germaine Pomfret, [facility] (Pharmacist) on 03/10/21 at [place] List Status: <None>  Medication Order Taking? Sig Documenting Provider Last Dose Status Informant  acetaminophen (TYLENOL) 650 MG CR tablet [id number] Yes Take 650-1,300 mg by mouth every 8 (eight) hours as needed for pain.  [provider] Taking Active Self  amiodarone  (PACERONE) 200 MG tablet [id number] Yes TAKE 1 TABLET(200 MG) BY MOUTH DAILY Gollan, Kathlene November, MD Taking Active   apixaban (ELIQUIS) 5 MG TABS tablet [id number] Yes TAKE 1 TABLET(5 MG) BY MOUTH TWICE DAILY Gollan, Kathlene November, MD Taking Active   busPIRone (BUSPAR) 5 MG tablet [id number] Yes TAKE 1 TABLET(5 MG) BY MOUTH TWICE DAILY Sowles, Drue Stager, MD Taking Active   cholecalciferol (VITAMIN D) 1000 UNITS tablet [id number] Yes Take 2,000 Units by mouth daily. [provider] Taking Active Self  Cranberry 500 MG TABS [id number] Yes Take 2 tablets by mouth daily. [provider] Taking Active Self  doxazosin (CARDURA) 1 MG tablet [id number] Yes Take 1 tablet (1 mg total) by mouth every evening. Minna Merritts, MD Taking Active Self  furosemide (LASIX) 20 MG tablet [id number] Yes Take 1 tablet (20 mg total) by mouth See admin instructions. Take 1 tablet (76m) by mouth every morning - take 1 additional tablet (225m after lunch if needed for swelling GoMinna MerrittsMD Taking Active Self  levothyroxine (SYNTHROID) 137 MCG tablet [id number] TAKE 1 TABLET(137 MCG) BY MOUTH EVERY MORNING Sowles, KrDrue StagerMD Taking Active Self  metoprolol succinate (TOPROL-XL) 50 MG 24 hr tablet [id number] Take with or immediately following a meal. Gollan, TiKathlene NovemberMD Taking Active Self  Multiple Vitamin (MULITIVITAMIN WITH MINERALS) TABS [id number] Take 1 tablet by mouth daily. [provider] Taking Active Self  potassium chloride (KLOR-CON) 10 MEQ tablet [id number] TAKE 1 TABLET BY MOUTH EVERY DAY AS DIRECTED. MAY TAKE AN EXTRA PILL AFTER LUNCH WITH FLUID PILL AS NEEDED FOR SWELLING Gollan, TiKathlene NovemberMD Taking Active Self  rosuvastatin (CRESTOR) 40 MG tablet [id number] TAKE 1 TABLET(40 MG) BY MOUTH DAILY SoSteele SizerMD Taking Active   temazepam (RESTORIL) 15 MG capsule [id number] TAKE 1 CAPSULE(15 MG) BY MOUTH AT BEDTIME AS NEEDED FOR SLEEP Sowles, KrDrue StagerMD Taking Active    valsartan (DIOVAN) 160 MG tablet [id number] Take 1 tablet (160 mg total) by mouth daily. Take in the morning Gollan, TiKathlene NovemberMD Taking Active Self          Patient Active Problem List   Diagnosis Date  Noted  . Severe sepsis ([place])   . Acquired thrombophilia ([place])   . Community acquired pneumonia 10/30/2020  . Acute respiratory failure with hypoxemia ([place]) 10/30/2020  . Multifocal pneumonia 10/30/2020  . Pyelonephritis 03/30/2020  . Acute pyelonephritis 03/29/2020  . Hydronephrosis of right kidney 03/29/2020  . Depression 03/29/2020  . Chronic diastolic CHF (congestive heart failure) ([place]) 05/24/2018  . AF (paroxysmal atrial fibrillation) ([place]) 05/24/2018  . CKD (chronic kidney disease), stage III ([place]) 05/17/2018  . 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 Shortness of breath 05/07/2018  . Pulmonary hypertension, unspecified ([place]) 04/05/2018  . Acute on chronic combined systolic and diastolic CHF (congestive heart failure) ([place]) 04/05/2018  . Paroxysmal sinus tachycardia ([place]) 01/29/2018  . Leg pain 01/04/2018  . PAD (peripheral artery disease) ([place]) 01/04/2018  . Carotid artery calcification, bilateral 12/19/2017  . Cervical radiculitis 12/19/2017  . Hyperglycemia 03/06/2016  . Hypothyroid 04/21/2015  . 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 Anxiety 04/21/2015  . Hyperlipidemia 04/21/2015  . Diuretic-induced hypokalemia 04/21/2015  . Palpitations 12/18/2012  . Hypertension 05/27/2012    Immunization History  Administered Date(s) Administered  . Fluad Quad(high Dose 65+) 08/11/2019  . Influenza, High Dose Seasonal PF 07/22/2015, 07/04/2018  . Influenza-Unspecified 08/25/2016, 08/13/2017, 08/13/2020  . PFIZER(Purple Top)SARS-COV-2 Vaccination 11/13/2019, 12/04/2019, 08/13/2020  . Pneumococcal Conjugate-13 09/07/2016  . Pneumococcal Polysaccharide-23 12/04/2017    Conditions to be addressed/monitored:  Hypertension, Hyperlipidemia,  Atrial Fibrillation, Heart Failure, Coronary Artery Disease, Chronic Kidney Disease, Hypothyroidism, Depression, Anxiety and Peripheral Artery Disease  Care Plan : General Pharmacy (Adult)  Updates made by Germaine Pomfret, RPH since 03/10/2021 12:00 AM    Problem: Hypertension, Hyperlipidemia, Atrial Fibrillation, Heart Failure, Coronary Artery Disease, Chronic Kidney Disease, Hypothyroidism, Depression, Anxiety and Peripheral Artery Disease   Priority: High    Long-Range Goal: Patient-Specific Goal   Start Date: 03/10/2021  This Visit's Progress: On track  Priority: High  Note:   Current Barriers:  . No barriers noted  Pharmacist Clinical Goal(s):  Marland Kitchen Patient will maintain control of blood pressure as evidenced by BP less than 140/90  through collaboration with PharmD and provider.   Interventions: . 1:1 collaboration with Steele Sizer, MD regarding development and update of comprehensive plan of care as evidenced by provider attestation and co-signature . Inter-disciplinary care team collaboration (see longitudinal plan of care) . Comprehensive medication review performed; medication list updated in electronic medical record  Heart Failure (Goal: manage symptoms and prevent exacerbations) -Controlled -Last ejection fraction: 60-65% (Date: 10/31/2020) -HF type: Diastolic -NYHA Class: II (slight limitation of activity) -AHA HF Stage: C (Heart disease and symptoms present) -Current treatment: . Doxazosin 1 mg nightly  . Furosemide 20 mg daily  . Metoprolol XL 50 mg daily  . Valsartan 160 mg daily  -Medications previously tried: NA  -Current home BP/HR readings: 130s/50-60s -Educated on Importance of blood pressure control -Recommended to continue current medication  Atrial Fibrillation (Goal: prevent stroke and major bleeding) -Controlled -CHADSVASC: 6 -Current treatment: . Rate/Rhthym control:  o Amiodarone 200 mg daily  o Metoprolol XL 50 mg daily  . Anticoagulation:  Eliquis 5 mg twice daily  -Medications previously tried: NA -Patient reports she has had a few instances of A-Fib, often associated with increased activity.  -Counseled on avoidance of NSAIDs due to increased bleeding risk with anticoagulants; -Recommended to continue current medication  Hyperlipidemia: (LDL goal < 70) -Controlled -Current treatment: . Rosuvastatin 40 mg daily  -Medications previously tried: NA  -Educated on Importance  of limiting foods high in cholesterol; -Recommended to continue current medication  Depression/Anxiety (Goal: Maintain stable mood) -Controlled -Current treatment: . Buspirone 5 mg twice daily  . Temazepam 15 mg nightly as needed - Sleep  -Medications previously tried/failed: NA -PHQ9: 0 -GAD7: 0 -Patient reports her sleep quality as very good. Does need to take temazepam every other night, but denies any lingering sedation or side effects from treatment. -Educated on Benefits of medication for symptom control -Recommended to continue current medication  Hypothyroidism (Goal: Maintain stable thyroid functon) -Controlled -Current treatment  . Levothyroxine 137 mcg daily  -Medications previously tried: NA -Patient reports concerns with hair loss, wonders if it could be an iron deficiency   separates from other medications and food  -Recommended to continue current medication Will plan to have PCP recheck thyroid and iron function at next follow-up.  Could consider biotin or B complex supplementation.   Patient Goals/Self-Care Activities . Patient will:  - check blood pressure 2-3 times weekly, document, and provide at future appointments  Follow Up Plan: Telephone follow up appointment with care management team member scheduled for:  08/24/2021 at 10:00 AM      Medication Assistance: None required.  Patient affirms current coverage meets needs.  Patient's preferred pharmacy is:  [id number] [address], [place] AT  [facility] [address] [place] [address] Phone: [phone] Fax: [phone]  Uses pill box? Yes Pt endorses 100% compliance  We discussed: Current pharmacy is preferred with insurance plan and patient is satisfied with pharmacy services Patient decided to: Continue current medication management strategy  Care Plan and Follow Up Patient Decision:  Patient agrees to Care Plan and Follow-up.  Plan: Telephone follow up appointment with care management team member scheduled for:  08/24/2021 at 10:00 AM  Doristine Section, [facility] [phone]

## 2021-03-11 NOTE — Progress Notes (Addendum)
Name: [name]   MRN: [id number]    DOB: Jan 19, 1941   Date:03/14/2021       Progress Note  Subjective  Chief Complaint  Follow Up  HPI  GAD: doing well on Buspar and sometimes she skips the afternoon dose, she has been taking for many years and no side effects , she also takes temazepam qhs prn and it helps her sleep when her mind is busy She denies feeling nervous, no history of panic attacks.    S/p neck fusion: it was done by Dr. Larose Hires April 2021 and was  doing well, symptoms started to bother her again earlier this year had MRI c-spine and lumbar spine, is now seeing Dr. Brien Few and had injections on her neck and back, but has not noticed much improvement yet Pain is worse on lumbar spine, pain is aching like and makes her legs fee weak, no bowel or bladder incontinence. Aggravated by walking and standing.   Carotid calcification: found on x-ray of neck. She also had a vascular study done back in 2011 that showed mild plaque formationon right.Sheis now on Crestor and Eliquis. Off aspirin since ablation 04/2018. She denies headache or dizzinessor double vision,shestillhasintermittentbalance problems - but states secondary to visual disturbance ( she can see better from left eye than right), Dr. Larose Hires thinks balanced disturbance is likely from neck surgery. She went back to Dr. Erven Colla and had ABI that was normal, carotid doppler was due in 2021, but Dr. Rockey Situ told her not to worry about doing it at this time. She also has a history of pulmonary hypertension - under the care of Dr. Rockey Situ She is compliant with medical visits and medications   HTN: bp at homeis usually controlled , she states bp is usually controlled, occasionally goes up to 140's, but most of the time is around 130's  No chest pain but has occasional palpitation , but seldom   Afib: she had a cardioversion in June2019and another one in July2019,she has intermittent palpitation still about once or  twice  a week it only lasts a few seconds and resolves by itself.  CHF chronic: she is down to one pillow at night.She has mild sob with moderate activity- like going up a flight of stairs -  no chest pain,she has intermittent lower extremity edema and is taking extra lasix and potassium at lunch prn as instructed by Dr. Rockey Situ .On ARB , beta blocker and Eliquis.   Senile Purpura: both arms and legs, on Eliquis.   Skin cancer: seeing dermatologist - Dr. Phillip Heal and having laser treatment for superficial basal cell carcinoma   Hypothyroidism: doing well on medication,  denies constipationor difficulty swallowing, She has noticed some recent hair loss . She has noticed some hair thinning, explained it may be from recent illness, but we can recheck TSH now, she prefers holding off to recheck next visit   Recurrent UTI:no recent episodes, she has a follow up this Summer with Urologist     Patient Active Problem List   Diagnosis Date Noted  . History of pyelonephritis   . Acquired thrombophilia ([place])   . History of pneumonia 10/2020  . Hydronephrosis of right kidney 03/29/2020  . Depression 03/29/2020  . Chronic diastolic CHF (congestive heart failure) ([place]) 05/24/2018  . AF (paroxysmal atrial fibrillation) ([place]) 05/24/2018  . CKD (chronic kidney disease), stage III ([place]) 05/17/2018  . 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 Pulmonary hypertension, unspecified ([place]) 04/05/2018  . Paroxysmal sinus tachycardia ([place]) 01/29/2018  .  Leg pain 01/04/2018  . PAD (peripheral artery disease) ([place]) 01/04/2018  . Carotid artery calcification, bilateral 12/19/2017  . Cervical radiculitis 12/19/2017  . Hyperglycemia 03/06/2016  . Hypothyroid 04/21/2015  . 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 Anxiety 04/21/2015  . Hyperlipidemia 04/21/2015  . Diuretic-induced hypokalemia 04/21/2015  . Palpitations 12/18/2012  . Hypertension 05/27/2012    Past Surgical History:   Procedure Laterality Date  . BACK SURGERY  07/2019  . CARDIOVERSION N/A 03/26/2018   Procedure: CARDIOVERSION;  Surgeon: Nelva Bush, MD;  Location: [facility] ORS;  Service: Cardiovascular;  Laterality: N/A;  . CARDIOVERSION N/A 04/24/2018   Procedure: CARDIOVERSION;  Surgeon: Minna Merritts, MD;  Location: [facility] ORS;  Service: Cardiovascular;  Laterality: N/A;  . ELECTROPHYSIOLOGY STUDY N/A 02/27/2012   Procedure: ELECTROPHYSIOLOGY STUDY;  Surgeon: Evans Lance, MD;  Location: [facility] CATH LAB;  Service: Cardiovascular;  Laterality: N/A;  . EPS and ablation for SVT  5/13   slow pathway ablation by Dr Lovena Le  . EYE SURGERY     surgery for detatched retina  . NECK SURGERY  02/2020  . SUPRAVENTRICULAR TACHYCARDIA ABLATION N/A 02/27/2012   Procedure: SUPRAVENTRICULAR TACHYCARDIA ABLATION;  Surgeon: Evans Lance, MD;  Location: [facility] CATH LAB;  Service: Cardiovascular;  Laterality: N/A;    Family History  Problem Relation Age of Onset  . Alzheimer's disease Mother   . Stroke Father   . Breast cancer Father   . Heart disease Father        Pacemaker  . Breast cancer Paternal Aunt   . Kidney cancer Maternal Grandmother   . Alzheimer's disease Maternal Grandfather   . Thyroid disease Paternal Grandmother   . Stroke Paternal Grandfather     Social History   Tobacco Use  . Smoking status: Never Smoker  . Smokeless tobacco: Never Used  . Tobacco comment: smoking cessation materials not required  Substance Use Topics  . Alcohol use: No     Current Outpatient Medications:  .  acetaminophen (TYLENOL) 650 MG CR tablet, Take 650-1,300 mg by mouth every 8 (eight) hours as needed for pain. , Disp: , Rfl:  .  amiodarone (PACERONE) 200 MG tablet, TAKE 1 TABLET(200 MG) BY MOUTH DAILY, Disp: 90 tablet, Rfl: 3 .  apixaban (ELIQUIS) 5 MG TABS tablet, TAKE 1 TABLET(5 MG) BY MOUTH TWICE DAILY, Disp: 60 tablet, Rfl: 5 .  busPIRone (BUSPAR) 5 MG tablet, TAKE 1 TABLET(5 MG) BY MOUTH TWICE DAILY, Disp: 180  tablet, Rfl: 0 .  cholecalciferol (VITAMIN D) 1000 UNITS tablet, Take 2,000 Units by mouth daily., Disp: , Rfl:  .  Cranberry 500 MG TABS, Take 2 tablets by mouth daily., Disp: , Rfl:  .  doxazosin (CARDURA) 1 MG tablet, Take 1 tablet (1 mg total) by mouth every evening., Disp: 90 tablet, Rfl: 3 .  furosemide (LASIX) 20 MG tablet, Take 1 tablet (20 mg total) by mouth See admin instructions. Take 1 tablet (20mg ) by mouth every morning - take 1 additional tablet (20mg ) after lunch if needed for swelling, Disp: 120 tablet, Rfl: 3 .  loratadine (CLARITIN) 10 MG tablet, Take 10 mg by mouth daily., Disp: , Rfl:  .  metoprolol succinate (TOPROL-XL) 50 MG 24 hr tablet, Take with or immediately following a meal., Disp: 90 tablet, Rfl: 3 .  Multiple Vitamin (MULITIVITAMIN WITH MINERALS) TABS, Take 1 tablet by mouth daily., Disp: , Rfl:  .  potassium chloride (KLOR-CON) 10 MEQ tablet, TAKE 1 TABLET BY MOUTH EVERY  DAY AS DIRECTED. MAY TAKE AN EXTRA PILL AFTER LUNCH WITH FLUID PILL AS NEEDED FOR SWELLING, Disp: 135 tablet, Rfl: 3 .  rosuvastatin (CRESTOR) 40 MG tablet, TAKE 1 TABLET(40 MG) BY MOUTH DAILY, Disp: 90 tablet, Rfl: 3 .  temazepam (RESTORIL) 15 MG capsule, TAKE 1 CAPSULE(15 MG) BY MOUTH AT BEDTIME AS NEEDED FOR SLEEP, Disp: 30 capsule, Rfl: 2 .  valsartan (DIOVAN) 160 MG tablet, Take 1 tablet (160 mg total) by mouth daily. Take in the morning, Disp: 90 tablet, Rfl: 3 .  levothyroxine (SYNTHROID) 137 MCG tablet, Take 1 tablet (137 mcg total) by mouth daily before breakfast., Disp: 90 tablet, Rfl: 1  Allergies  Allergen Reactions  . Chocolate Swelling  . Penicillins Swelling and Rash    Has patient had a PCN reaction causing immediate rash, facial/tongue/throat swelling, SOB or lightheadedness with hypotension: Yes Has patient had a PCN reaction causing severe rash involving mucus membranes or skin necrosis: No Has patient had a PCN reaction that required hospitalization: No Has patient had a PCN  reaction occurring within the last 10 years: No If all of the above answers are "NO", then may proceed with Cephalosporin use.   . Codeine Nausea And Vomiting  . Erythromycin Itching and Swelling  . Septra [Sulfamethoxazole-Trimethoprim] Swelling    I personally reviewed active problem list, medication list, allergies, family history, social history, health maintenance with the patient/caregiver today.   ROS  Constitutional: Negative for fever or weight change.  Respiratory: Negative for cough but has mild  shortness of breath with moderate activity .   Cardiovascular: Negative for chest pain , positive for occcasional  palpitations.  Gastrointestinal: Negative for abdominal pain, no bowel changes.  Musculoskeletal: Negative for gait problem or joint swelling.  Skin: Negative for rash.  Neurological: Negative for dizziness or headache.  No other specific complaints in a complete review of systems (except as listed in HPI above).  Objective  Vitals:   03/14/21 0754  BP: 132/70  Pulse: (!) 57  Resp: 16  Temp: 97.8 F (36.6 C)  TempSrc: Oral  SpO2: 99%  Weight: 124 lb (56.2 kg)  Height: 5\' 6"  (1.676 m)    Body mass index is 20.01 kg/m.  Physical Exam  Constitutional: Patient appears well-developed and well-nourished. No distress.  HEENT: head atraumatic, normocephalic, pupils equal and reactive to light,  neck supple Cardiovascular: bradycardia , irregular rhythm and normal heart sounds.  No murmur heard. No BLE edema. Pulmonary/Chest: Effort normal and breath sounds normal. No respiratory distress. Abdominal: Soft.  There is no tenderness. Psychiatric: Patient has a normal mood and affect. behavior is normal. Judgment and thought content normal. Skin: senile purpura   PHQ2/9: Depression screen [facility] 2/9 03/14/2021 12/01/2020 11/18/2020 09/13/2020 09/13/2020  Decreased Interest 0 0 0 0 0  Down, Depressed, Hopeless 0 0 0 0 0  PHQ - 2 Score 0 0 0 0 0  Altered sleeping - -  - 0 -  Tired, decreased energy - - - 1 -  Change in appetite - - - 0 -  Feeling bad or failure about yourself  - - - 0 -  Trouble concentrating - - - 0 -  Moving slowly or fidgety/restless - - - 0 -  Suicidal thoughts - - - 0 -  PHQ-9 Score - - - 1 -  Difficult doing work/chores - - - - -  Some recent data might be hidden    phq 9 is negative   Fall Risk: Fall Risk  03/14/2021 12/01/2020 11/18/2020 09/13/2020 08/02/2020  Falls in the past year? 0 0 0 0 0  Number falls in past yr: 0 0 0 0 0  Injury with Fall? 0 0 0 0 0  Comment - - - - -  Risk for fall due to : - - - - -  Risk for fall due to: Comment - - - - -  Follow up - - - - -    Functional Status Survey: Is the patient deaf or have difficulty hearing?: No Does the patient have difficulty seeing, even when wearing glasses/contacts?: Yes Does the patient have difficulty concentrating, remembering, or making decisions?: No Does the patient have difficulty walking or climbing stairs?: No Does the patient have difficulty dressing or bathing?: No Does the patient have difficulty doing errands alone such as visiting a doctor's office or shopping?: No    Assessment & Plan  1. Chronic a-fib (HCC)  Rate controlled   2. Essential hypertension  At goal   3. Senile purpura (HCC)  Stable   4. SVT (supraventricular tachycardia) (HCC)  No recent problems   5. Insomnia due to anxiety and fear  Taking prn medication    6. Hypothyroidism in adult  - levothyroxine (SYNTHROID) 137 MCG tablet; Take 1 tablet (137 mcg total) by mouth daily before breakfast.  Dispense: 90 tablet; Refill: 1  7. Pulmonary hypertension (HCC)  Stable   8. Chronic combined systolic (congestive) and diastolic (congestive) heart failure ([place])   9. Seasonal allergic rhinitis due to pollen  - loratadine (CLARITIN) 10 MG tablet; Take 10 mg by mouth daily.

## 2021-03-14 ENCOUNTER — Encounter: Payer: Self-pay | Admitting: Family Medicine

## 2021-03-14 ENCOUNTER — Other Ambulatory Visit: Payer: Self-pay

## 2021-03-14 ENCOUNTER — Ambulatory Visit (INDEPENDENT_AMBULATORY_CARE_PROVIDER_SITE_OTHER): Payer: Medicare Other | Admitting: Family Medicine

## 2021-03-14 VITALS — BP 132/70 | HR 57 | Temp 97.8°F | Resp 16 | Ht 66.0 in | Wt 124.0 lb

## 2021-03-14 DIAGNOSIS — F5105 Insomnia due to other mental disorder: Secondary | ICD-10-CM | POA: Diagnosis not present

## 2021-03-14 DIAGNOSIS — I482 Chronic atrial fibrillation, unspecified: Secondary | ICD-10-CM | POA: Diagnosis not present

## 2021-03-14 DIAGNOSIS — F409 Phobic anxiety disorder, unspecified: Secondary | ICD-10-CM

## 2021-03-14 DIAGNOSIS — I10 Essential (primary) hypertension: Secondary | ICD-10-CM | POA: Diagnosis not present

## 2021-03-14 DIAGNOSIS — J301 Allergic rhinitis due to pollen: Secondary | ICD-10-CM

## 2021-03-14 DIAGNOSIS — D692 Other nonthrombocytopenic purpura: Secondary | ICD-10-CM | POA: Diagnosis not present

## 2021-03-14 DIAGNOSIS — I5042 Chronic combined systolic (congestive) and diastolic (congestive) heart failure: Secondary | ICD-10-CM

## 2021-03-14 DIAGNOSIS — E039 Hypothyroidism, unspecified: Secondary | ICD-10-CM

## 2021-03-14 DIAGNOSIS — I272 Pulmonary hypertension, unspecified: Secondary | ICD-10-CM

## 2021-03-14 DIAGNOSIS — I471 Supraventricular tachycardia, unspecified: Secondary | ICD-10-CM

## 2021-03-14 DIAGNOSIS — Z87448 Personal history of other diseases of urinary system: Secondary | ICD-10-CM | POA: Insufficient documentation

## 2021-03-14 MED ORDER — LEVOTHYROXINE SODIUM 137 MCG PO TABS: 137.0000 ug | ORAL_TABLET | Freq: Every day | ORAL | 1 refills | Status: DC

## 2021-03-23 ENCOUNTER — Other Ambulatory Visit: Payer: Self-pay | Admitting: Family Medicine

## 2021-03-23 DIAGNOSIS — F409 Phobic anxiety disorder, unspecified: Secondary | ICD-10-CM

## 2021-04-01 DIAGNOSIS — M47816 Spondylosis without myelopathy or radiculopathy, lumbar region: Secondary | ICD-10-CM | POA: Diagnosis not present

## 2021-04-11 ENCOUNTER — Telehealth: Payer: Self-pay | Admitting: Cardiovascular Disease

## 2021-04-11 NOTE — Telephone Encounter (Signed)
Patient with diagnosis of atrial fibrillation on Eliquis for anticoagulation.    Procedure: spinal injection Date of procedure: TBD   CHA2DS2-VASc Score = 6  This indicates a 9.7% annual risk of stroke. The patient's score is based upon: CHF History: Yes HTN History: Yes Diabetes History: No Stroke History: No Vascular Disease History: Yes Age Score: 2 Gender Score: 1    CrCl 47 Platelet count 312.  Per office protocol, patient can hold Eliquis for 3 days prior to procedure.   Patient will not need bridging with Lovenox (enoxaparin) around procedure.

## 2021-04-11 NOTE — Telephone Encounter (Signed)
   [facility] Pre-operative Risk Assessment    Patient Name: [name]  DOB: 1941/09/23  MRN: [id number]   HEARTCARE STAFF: - Please ensure there is not already an duplicate clearance open for this procedure. - Under Visit Info/Reason for Call, type in Other and utilize the format Clearance MM/DD/YY or Clearance TBD. Do not use dashes or single digits. - If request is for dental extraction, please clarify the # of teeth to be extracted. - If the patient is currently at the dentist's office, call Pre-Op APP to address. If the patient is not currently in the dentist office, please route to the Pre-Op pool  Request for surgical clearance:  What type of surgery is being performed? Spinal injection    When is this surgery scheduled? TBD   What type of clearance is required (medical clearance vs. Pharmacy clearance to hold med vs. Both)? both  Are there any medications that need to be held prior to surgery and how long? Discontinue Eliquis for 3-5 days   Practice name and name of physician performing surgery? [facility] - Dr Marlaine Hind   What is the office phone number? [phone]   7.   What is the office fax number? [phone]  8.   Anesthesia type (None, local, MAC, general) ? Not listed    Ace Gins 04/11/2021, 2:20 PM  _________________________________________________________________   (provider comments below)

## 2021-04-11 NOTE — Telephone Encounter (Signed)
Pharmacy, can you please comment on how long Eliquis can be held for upcoming spinal injection. We received similar clearance in 02/2021 and recommendation was to hold Eliquis for 3 days so I assume it will be the same this time but just want to make sure.  Thank you!

## 2021-04-12 NOTE — Telephone Encounter (Signed)
    [name] DOB:  09-Aug-1941  MRN:  [id number]   Primary Cardiologist: Dr. Rockey Situ  Chart reviewed as part of pre-operative protocol coverage. Patient was last seen by Dr. Rockey Situ in 10/2020. She had a spinal injection to her "lower back" in 02/2021 and now needs a cervical spinal injection. Patient was contacted today for further pre-op evaluation. She tolerated the last injection well and has been doing well from a cardiac standpoint. Her atrial fibrillation is well controlled and she denies any significant palpitations. No chest pain, shortness of breath, orthopnea, dizziness, syncope. Given past medical history and time since last visit, based on ACC/AHA guidelines, [name] would be at acceptable risk for the planned procedure without further cardiovascular testing.   Per Pharmacy and office protocol, patient can hold Eliquis for 3 days prior to procedure. Patient will NOT need bridging with Lovenox around procedure. Please restart Eliquis as soon as able following procedure.  I will route this recommendation to the requesting party via Epic fax function and remove from pre-op pool.  Please call with questions.  Darreld Mclean, PA-C 04/12/2021, 11:54 AM

## 2021-04-21 ENCOUNTER — Other Ambulatory Visit: Payer: Self-pay | Admitting: Family Medicine

## 2021-04-21 DIAGNOSIS — F411 Generalized anxiety disorder: Secondary | ICD-10-CM

## 2021-05-03 DIAGNOSIS — M47812 Spondylosis without myelopathy or radiculopathy, cervical region: Secondary | ICD-10-CM | POA: Diagnosis not present

## 2021-05-16 ENCOUNTER — Telehealth: Payer: Self-pay

## 2021-05-16 NOTE — Progress Notes (Signed)
Chronic Care Management Pharmacy Assistant   Name: FRANCETTA JANIK  MRN: [id number] DOB: [birth year]   Reason for Encounter: Hypertension and CHF Disease State Call   Recent office visits:  03/14/2021 Steele Sizer, MD (PCP Office Visit) for Follow-up- No medication changes noted; patient instructed to return in 6 months  Recent consult visits:  04/12/2021 Sande Rives, PA-C (Cardiology) Telephone Call- Patient is having a procedure and instructed to hold Eliquis 3 days prior to procedure; Patient instructed as soon as she is able after procedure she can restart [facility] visits:  None in previous 6 months  Medications: Outpatient Encounter Medications as of 05/16/2021  Medication Sig   acetaminophen (TYLENOL) 650 MG CR tablet Take 650-1,300 mg by mouth every 8 (eight) hours as needed for pain.    amiodarone (PACERONE) 200 MG tablet TAKE 1 TABLET(200 MG) BY MOUTH DAILY   apixaban (ELIQUIS) 5 MG TABS tablet TAKE 1 TABLET(5 MG) BY MOUTH TWICE DAILY   busPIRone (BUSPAR) 5 MG tablet TAKE 1 TABLET(5 MG) BY MOUTH TWICE DAILY   cholecalciferol (VITAMIN D) 1000 UNITS tablet Take 2,000 Units by mouth daily.   Cranberry 500 MG TABS Take 2 tablets by mouth daily.   doxazosin (CARDURA) 1 MG tablet Take 1 tablet (1 mg total) by mouth every evening.   furosemide (LASIX) 20 MG tablet Take 1 tablet (20 mg total) by mouth See admin instructions. Take 1 tablet ('20mg'$ ) by mouth every morning - take 1 additional tablet ('20mg'$ ) after lunch if needed for swelling   levothyroxine (SYNTHROID) 137 MCG tablet Take 1 tablet (137 mcg total) by mouth daily before breakfast.   loratadine (CLARITIN) 10 MG tablet Take 10 mg by mouth daily.   metoprolol succinate (TOPROL-XL) 50 MG 24 hr tablet Take with or immediately following a meal.   Multiple Vitamin (MULITIVITAMIN WITH MINERALS) TABS Take 1 tablet by mouth daily.   potassium chloride (KLOR-CON) 10 MEQ tablet TAKE 1 TABLET BY MOUTH EVERY DAY AS  DIRECTED. MAY TAKE AN EXTRA PILL AFTER LUNCH WITH FLUID PILL AS NEEDED FOR SWELLING   rosuvastatin (CRESTOR) 40 MG tablet TAKE 1 TABLET(40 MG) BY MOUTH DAILY   temazepam (RESTORIL) 15 MG capsule TAKE 1 CAPSULE(15 MG) BY MOUTH AT BEDTIME AS NEEDED FOR SLEEP   valsartan (DIOVAN) 160 MG tablet Take 1 tablet (160 mg total) by mouth daily. Take in the morning   No facility-administered encounter medications on file as of 05/16/2021.   Care Gaps: Zoster Vaccines- Shingrix TETANUS/TDAP (last completed 10/24/2007) COVID-19 Vaccine 4th Booster  Star Rating Drugs: Rosuvastatin 40 mg last filled on 03/26/2021 for a 90-Day supply with [facility] Valsartan 160 mg  last filled on 02/10/2021 for a 90-Day supply with [facility]    Recent Office Vitals: BP Readings from Last 3 Encounters:  03/14/21 132/70  12/01/20 122/60  11/24/20 (!) 121/55   Pulse Readings from Last 3 Encounters:  03/14/21 (!) 57  12/01/20 68  11/24/20 78    Wt Readings from Last 3 Encounters:  03/14/21 124 lb (56.2 kg)  12/01/20 127 lb 8 oz (57.8 kg)  11/24/20 123 lb 7.3 oz (56 kg)     Kidney Function Lab Results  Component Value Date/Time   CREATININE 0.89 12/01/2020 04:18 PM   CREATININE 0.94 11/24/2020 04:58 AM   CREATININE 0.83 11/21/2020 05:35 AM   CREATININE 1.14 (H) 09/13/2020 12:00 AM   GFRNONAA 62 12/01/2020 04:18 PM   GFRAA 72 12/01/2020 04:18 PM    BMP  Latest Ref Rng & Units 12/01/2020 11/24/2020 11/21/2020  Glucose 65 - 99 mg/dL 95 104(H) 169(H)  BUN 7 - 25 mg/dL '17 21 21  '$ Creatinine 0.60 - 0.93 mg/dL 0.89 0.94 0.83  BUN/Creat Ratio 6 - 22 (calc) NOT APPLICABLE - -  Sodium A999333 - 146 mmol/L 140 137 142  Potassium 3.5 - 5.3 mmol/L 4.6 3.5 3.3(L)  Chloride 98 - 110 mmol/L 103 100 107  CO2 20 - 32 mmol/L '26 28 25  '$ Calcium 8.6 - 10.4 mg/dL 9.0 8.4(L) 8.7(L)   Reviewed chart prior to disease state call. Spoke with patient regarding BP  Current antihypertensive regimen:  Metoprolol  Succinate 50 mg 1 tablet daily Valsartan 160 mg 1 tablet daily Doxazosin 1 mg nightly  How often are you checking your Blood Pressure?  Patient reports that she checks it 3 times daily  Current home BP readings: last reading was 140/60  What recent interventions/DTPs have been made by any provider to improve Blood Pressure control since last CPP Visit: Patient reports that Doxazosin 1 mg was added by Cardio  Any recent hospitalizations or ED visits since last visit with CPP? No  What diet changes have been made to improve Blood Pressure Control?  Patient reports that she eats well. Patient stated she loves to eat, but she does watch her sodium and salt intake. Patient reports the only thing that she will add a little salt to is her eggs and tomatoes when she has them.  What exercise is being done to improve your Blood Pressure Control?  Patient reports that she is having some neck and back pain which is limiting her exercise but she is trying to walk daily. Patient reports having a neck injection about 3 weeks ago and the injection is helping with her pain some just not as much as she would like. She reports her pain level being a 4, 5/10 today. She stated that she did go walking this morning around [employer] as it's too hot for her to walk outside at the moment. Patient does report that when she walks due to her back pain it does cause some discomfort in her legs.  Patient reports that she will also be having an injection to assist with her back pain soon, and the provider is going to try something different than what he injected into her neck.   Adherence Review: Is the patient currently on ACE/ARB medication? Yes Does the patient have >5 day gap between last estimated fill dates? No   Congestive Heart Failure Questions  Do you monitor your weight every day? She monitors it daily.   Have you noticed any changes in your weight or fluid status Patient denies any weight gain at this time. She  stated a few weeks ago she went to the beach to stay with her daughter and noticed she had gained about 2lbs, but she attributes it to eating, and has since lost the weight. Patient reports her normal weight is between 123.5 lbs and 125 lbs  How often are you taking your fluid pill ? Furosemide 20 mg 1 tablet daily  Current Medications for CHF Doxazosin 1 mg nightly Furosemide 20 mg daily Metoprolol XL 50 mg daily Valsartan 160 mg daily Eliquis 5 mg 1 tablet twice daily  Patient reports that she is going to have cataract surgery soon. Patient stated she has an appointment with her Ophthalmologist on 06/03/2021 to start that process, and after she has had the surgery she will get the injections  in her back. Patient denies needing refills at this time. She did advise that soon she will meet her financial quota for her Eliquis so she can have provider send script over for PAP and she normally gets a 6 month free supply. According to our Patient Panel patient PAP is due next month.  Instructed patient to call me if she needs assistance with this or for anything needed in the future.  08/24/2021 @ 1000 patient has appt. With Junius Argyle, CPP 05/26/2021 @ 1000 patient is scheduled for her [place], [place] Pharmacist Assistant Phone: [phone]

## 2021-05-23 NOTE — Addendum Note (Signed)
Encounter addended by: Annie Paras on: 05/23/2021 6:16 PM  Actions taken: Letter saved

## 2021-05-26 ENCOUNTER — Ambulatory Visit (INDEPENDENT_AMBULATORY_CARE_PROVIDER_SITE_OTHER): Payer: Medicare Other

## 2021-05-26 ENCOUNTER — Other Ambulatory Visit: Payer: Self-pay

## 2021-05-26 VITALS — BP 154/62 | HR 58 | Temp 97.5°F | Resp 15 | Ht 66.0 in | Wt 124.8 lb

## 2021-05-26 DIAGNOSIS — Z0000 Encounter for general adult medical examination without abnormal findings: Secondary | ICD-10-CM | POA: Diagnosis not present

## 2021-05-26 NOTE — Patient Instructions (Signed)
[name] , Thank you for taking time to come for your Medicare Wellness Visit. I appreciate your ongoing commitment to your health goals. Please review the following plan we discussed and let me know if I can assist you in the future.   Screening recommendations/referrals: Colonoscopy: no longer required Mammogram: no longer required Bone Density: done 06/12/17 Recommended yearly ophthalmology/optometry visit for glaucoma screening and checkup Recommended yearly dental visit for hygiene and checkup  Vaccinations: Influenza vaccine: done 08/13/20 Pneumococcal vaccine: done 12/04/17 Tdap vaccine: due Shingles vaccine: Shingrix discussed. Please contact your pharmacy for coverage information.  Covid-19: done 11/13/19, 12/04/19 & 08/13/20   Advanced directives: Please bring a copy of your health care power of attorney and living will to the office at your convenience.   Conditions/risks identified: Keep up the great work!  Next appointment: Follow up in one year for your annual wellness visit    Preventive Care 65 Years and Older, Female Preventive care refers to lifestyle choices and visits with your health care provider that can promote health and wellness. What does preventive care include? A yearly physical exam. This is also called an annual well check. Dental exams once or twice a year. Routine eye exams. Ask your health care provider how often you should have your eyes checked. Personal lifestyle choices, including: Daily care of your teeth and gums. Regular physical activity. Eating a healthy diet. Avoiding tobacco and drug use. Limiting alcohol use. Practicing safe sex. Taking low-dose aspirin every day. Taking vitamin and mineral supplements as recommended by your health care provider. What happens during an annual well check? The services and screenings done by your health care provider during your annual well check will depend on your age, overall health, lifestyle risk  factors, and family history of disease. Counseling  Your health care provider may ask you questions about your: Alcohol use. Tobacco use. Drug use. Emotional well-being. Home and relationship well-being. Sexual activity. Eating habits. History of falls. Memory and ability to understand (cognition). Work and work Statistician. Reproductive health. Screening  You may have the following tests or measurements: Height, weight, and BMI. Blood pressure. Lipid and cholesterol levels. These may be checked every 5 years, or more frequently if you are over 31 years old. Skin check. Lung cancer screening. You may have this screening every year starting at age 61 if you have a 30-pack-year history of smoking and currently smoke or have quit within the past 15 years. Fecal occult blood test (FOBT) of the stool. You may have this test every year starting at age 32. Flexible sigmoidoscopy or colonoscopy. You may have a sigmoidoscopy every 5 years or a colonoscopy every 10 years starting at age 10. Hepatitis C blood test. Hepatitis B blood test. Sexually transmitted disease (STD) testing. Diabetes screening. This is done by checking your blood sugar (glucose) after you have not eaten for a while (fasting). You may have this done every 1-3 years. Bone density scan. This is done to screen for osteoporosis. You may have this done starting at age 86. Mammogram. This may be done every 1-2 years. Talk to your health care provider about how often you should have regular mammograms. Talk with your health care provider about your test results, treatment options, and if necessary, the need for more tests. Vaccines  Your health care provider may recommend certain vaccines, such as: Influenza vaccine. This is recommended every year. Tetanus, diphtheria, and acellular pertussis (Tdap, Td) vaccine. You may need a Td booster every 10 years.  Zoster vaccine. You may need this after age 60. Pneumococcal 13-valent  conjugate (PCV13) vaccine. One dose is recommended after age 65. Pneumococcal polysaccharide (PPSV23) vaccine. One dose is recommended after age 97. Talk to your health care provider about which screenings and vaccines you need and how often you need them. This information is not intended to replace advice given to you by your health care provider. Make sure you discuss any questions you have with your health care provider. Document Released: 11/05/2015 Document Revised: 06/28/2016 Document Reviewed: 08/10/2015 Elsevier Interactive Patient Education  2017 [place] Prevention in the Home Falls can cause injuries. They can happen to people of all ages. There are many things you can do to make your home safe and to help prevent falls. What can I do on the outside of my home? Regularly fix the edges of walkways and driveways and fix any cracks. Remove anything that might make you trip as you walk through a door, such as a raised step or threshold. Trim any bushes or trees on the path to your home. Use bright outdoor lighting. Clear any walking paths of anything that might make someone trip, such as rocks or tools. Regularly check to see if handrails are loose or broken. Make sure that both sides of any steps have handrails. Any raised decks and porches should have guardrails on the edges. Have any leaves, snow, or ice cleared regularly. Use sand or salt on walking paths during winter. Clean up any spills in your garage right away. This includes oil or grease spills. What can I do in the bathroom? Use night lights. Install grab bars by the toilet and in the tub and shower. Do not use towel bars as grab bars. Use non-skid mats or decals in the tub or shower. If you need to sit down in the shower, use a plastic, non-slip stool. Keep the floor dry. Clean up any water that spills on the floor as soon as it happens. Remove soap buildup in the tub or shower regularly. Attach bath mats  securely with double-sided non-slip rug tape. Do not have throw rugs and other things on the floor that can make you trip. What can I do in the bedroom? Use night lights. Make sure that you have a light by your bed that is easy to reach. Do not use any sheets or blankets that are too big for your bed. They should not hang down onto the floor. Have a firm chair that has side arms. You can use this for support while you get dressed. Do not have throw rugs and other things on the floor that can make you trip. What can I do in the kitchen? Clean up any spills right away. Avoid walking on wet floors. Keep items that you use a lot in easy-to-reach places. If you need to reach something above you, use a strong step stool that has a grab bar. Keep electrical cords out of the way. Do not use floor polish or wax that makes floors slippery. If you must use wax, use non-skid floor wax. Do not have throw rugs and other things on the floor that can make you trip. What can I do with my stairs? Do not leave any items on the stairs. Make sure that there are handrails on both sides of the stairs and use them. Fix handrails that are broken or loose. Make sure that handrails are as long as the stairways. Check any carpeting to make sure that  it is firmly attached to the stairs. Fix any carpet that is loose or worn. Avoid having throw rugs at the top or bottom of the stairs. If you do have throw rugs, attach them to the floor with carpet tape. Make sure that you have a light switch at the top of the stairs and the bottom of the stairs. If you do not have them, ask someone to add them for you. What else can I do to help prevent falls? Wear shoes that: Do not have high heels. Have rubber bottoms. Are comfortable and fit you well. Are closed at the toe. Do not wear sandals. If you use a stepladder: Make sure that it is fully opened. Do not climb a closed stepladder. Make sure that both sides of the stepladder  are locked into place. Ask someone to hold it for you, if possible. Clearly mark and make sure that you can see: Any grab bars or handrails. First and last steps. Where the edge of each step is. Use tools that help you move around (mobility aids) if they are needed. These include: Canes. Walkers. Scooters. Crutches. Turn on the lights when you go into a dark area. Replace any light bulbs as soon as they burn out. Set up your furniture so you have a clear path. Avoid moving your furniture around. If any of your floors are uneven, fix them. If there are any pets around you, be aware of where they are. Review your medicines with your doctor. Some medicines can make you feel dizzy. This can increase your chance of falling. Ask your doctor what other things that you can do to help prevent falls. This information is not intended to replace advice given to you by your health care provider. Make sure you discuss any questions you have with your health care provider. Document Released: 08/05/2009 Document Revised: 03/16/2016 Document Reviewed: 11/13/2014 Elsevier Interactive Patient Education  2017 [employer].

## 2021-05-26 NOTE — Progress Notes (Signed)
Subjective:   [name] is a 80 y.o. female who presents for Medicare Annual (Subsequent) preventive examination.  Review of Systems     Cardiac Risk Factors include: advanced age (>19mn, >>68women);hypertension;dyslipidemia     Objective:    Today's Vitals   05/26/21 1003 05/26/21 1010  BP: (!) 154/62   Pulse: (!) 58   Resp: 15   Temp: (!) 97.5 F (36.4 C)   TempSrc: Oral   SpO2: 98%   Weight: 124 lb 12.8 oz (56.6 kg)   Height: '5\' 6"'$  (1.676 m)   PainSc:  5    Body mass index is 20.14 kg/m.  Advanced Directives 05/26/2021 11/22/2020 11/19/2020 10/30/2020 08/28/2020 05/25/2020 03/29/2020  Does Patient Have a Medical Advance Directive? Yes Yes Yes Yes No Yes Yes  Type of AParamedicof [place] will Healthcare Power of [place] will Living will;Healthcare Power of [place]Living will [place] will  Does patient want to make changes to medical advance directive? - Yes (ED - send information to [place] - Yes (MAU/Ambulatory/Procedural Areas - Information given) - - No - Patient declined  Copy of [place] Chart? No - copy requested - - Yes - validated most recent copy scanned in chart (See row information) - No - copy requested No - copy requested  Would patient like information on creating a medical advance directive? - - - - No - Patient declined - No - Patient declined  Pre-existing out of facility DNR order (yellow form or pink MOST form) - - - - - - -    Current Medications (verified) Outpatient Encounter Medications as of 05/26/2021  Medication Sig   acetaminophen (TYLENOL) 650 MG CR tablet Take 650-1,300 mg by mouth every 8 (eight) hours as needed for pain.    amiodarone (PACERONE) 200 MG tablet TAKE 1 TABLET(200 MG) BY MOUTH DAILY   apixaban (ELIQUIS) 5 MG TABS tablet TAKE 1 TABLET(5 MG) BY MOUTH TWICE DAILY   busPIRone (BUSPAR)  5 MG tablet TAKE 1 TABLET(5 MG) BY MOUTH TWICE DAILY   cholecalciferol (VITAMIN D) 1000 UNITS tablet Take 2,000 Units by mouth daily.   Cranberry 500 MG TABS Take 2 tablets by mouth daily.   doxazosin (CARDURA) 1 MG tablet Take 1 tablet (1 mg total) by mouth every evening.   furosemide (LASIX) 20 MG tablet Take 1 tablet (20 mg total) by mouth See admin instructions. Take 1 tablet ('20mg'$ ) by mouth every morning - take 1 additional tablet ('20mg'$ ) after lunch if needed for swelling   levothyroxine (SYNTHROID) 137 MCG tablet Take 1 tablet (137 mcg total) by mouth daily before breakfast.   loratadine (CLARITIN) 10 MG tablet Take 10 mg by mouth daily.   metoprolol succinate (TOPROL-XL) 50 MG 24 hr tablet Take with or immediately following a meal.   Multiple Vitamin (MULITIVITAMIN WITH MINERALS) TABS Take 1 tablet by mouth daily.   potassium chloride (KLOR-CON) 10 MEQ tablet TAKE 1 TABLET BY MOUTH EVERY DAY AS DIRECTED. MAY TAKE AN EXTRA PILL AFTER LUNCH WITH FLUID PILL AS NEEDED FOR SWELLING   rosuvastatin (CRESTOR) 40 MG tablet TAKE 1 TABLET(40 MG) BY MOUTH DAILY   temazepam (RESTORIL) 15 MG capsule TAKE 1 CAPSULE(15 MG) BY MOUTH AT BEDTIME AS NEEDED FOR SLEEP   valsartan (DIOVAN) 160 MG tablet Take 1 tablet (160 mg total) by mouth daily. Take in the morning   No facility-administered encounter medications  on file as of 05/26/2021.    Allergies (verified) Chocolate, Penicillins, Codeine, Erythromycin, and Septra [sulfamethoxazole-trimethoprim]   History: Past Medical History:  Diagnosis Date   Anxiety    Bronchitis    Chronic combined systolic (congestive) and diastolic (congestive) heart failure ([place])    a. 02/2018 Echo: EF 45-50%, diff HK, triv AI, mod MR, mildly dil LA/RA, nl RV fxn, mod TR. PASP 40-73mHg.   Chronic kidney disease    DJD (degenerative joint disease), cervical    History of stress test    a. 01/2008 MV: EF 76%. Fair ex tol. No ischemia.   Hyperlipidemia    Hypertension     Hypothyroid    LBBB (left bundle branch block)    Leg pain    a. 01/2018 ABI's wnl.   Lumbar spondylosis    Persistent atrial fibrillation ([place]    a. 01/2018 Event monitor: 45 runs of SVT, longest 14.5 sec. SVT felt to be Afib/flutter-->2% burden. Longest run of AF 4h 293mCHA2DS2VASc = 5-->Xarelto); b. 03/2019 s/p DCCV; c. 04/2019 Repeat Amio load and DCCV.   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   Past Surgical History:  Procedure Laterality Date   BACK SURGERY  07/2019   CARDIOVERSION N/A 03/26/2018   Procedure: CARDIOVERSION;  Surgeon: EnNelva BushMD;  Location: [facility];  Service: Cardiovascular;  Laterality: N/A;   CARDIOVERSION N/A 04/24/2018   Procedure: CARDIOVERSION;  Surgeon: GoMinna MerrittsMD;  [facility];  Service: Cardiovascular;  Laterality: N/A;   ELECTROPHYSIOLOGY STUDY N/A 02/27/2012   Procedure: ELECTROPHYSIOLOGY STUDY;  Surgeon: GrEvans LanceMD;  [facility];  Service: Cardiovascular;  Laterality: N/A;   EPS and ablation for SVT  5/13   slow pathway ablation by Dr TaLovena Le EYE SURGERY     surgery for detatched retina   NECK SURGERY  02/2020   SUPRAVENTRICULAR TACHYCARDIA ABLATION N/A 02/27/2012   Procedure: SUPRAVENTRICULAR TACHYCARDIA ABLATION;  Surgeon: GrEvans LanceMD;  [facility];  Service: Cardiovascular;  Laterality: N/A;   Family History  Problem Relation Age of Onset   Alzheimer's disease Mother    Stroke Father    Breast cancer Father    Heart disease Father        Pacemaker   Breast cancer Paternal Aunt    Kidney cancer Maternal Grandmother    Alzheimer's disease Maternal Grandfather    Thyroid disease Paternal Grandmother    Stroke Paternal Grandfather    Social History   Socioeconomic History   Marital status: Widowed    Spouse name: Not on file   Number of children: 1   Years of education: Not on file   Highest education level: 12th grade  Occupational  History   Occupation: Retired  Tobacco Use   Smoking status: Never   Smokeless tobacco: Never   Tobacco comments:    smoking cessation materials not required  Vaping Use   Vaping Use: Never used  Substance and Sexual Activity   Alcohol use: No   Drug use: No   Sexual activity: Not Currently    Partners: Male    Birth control/protection: Post-menopausal  Other Topics Concern   Not on file  Social History Narrative   [place]   She has cats, some inside, some outside    Social Determinants of Health   Financial Resource Strain: Low Risk    Difficulty  of Paying Living Expenses: Not hard at all  Food Insecurity: No Food Insecurity   Worried About [place] in the Last Year: Never true   [place] in the Last Year: Never true  Transportation Needs: No Transportation Needs   Lack of Transportation (Medical): No   Lack of Transportation (Non-Medical): No  Physical Activity: Inactive   Days of Exercise per Week: 0 days   Minutes of Exercise per Session: 0 min  Stress: No Stress Concern Present   Feeling of Stress : Only a little  Social Connections: Moderately Integrated   Frequency of Communication with Friends and Family: More than three times a week   Frequency of Social Gatherings with Friends and Family: More than three times a week   Attends Religious Services: More than 4 times per year   Active Member of [employer] or Organizations: Yes   Attends Archivist Meetings: More than 4 times per year   Marital Status: Widowed    Tobacco Counseling Counseling given: Not Answered Tobacco comments: smoking cessation materials not required   Clinical Intake:  Pre-visit preparation completed: Yes  Pain : 0-10 Pain Score: 5  Pain Type: Chronic pain Pain Location: Back (neck) Pain Orientation: Lower Pain Descriptors / Indicators: Aching Pain Onset: More than a month ago Pain Frequency: Constant     BMI - recorded:  20.14 Nutritional Status: BMI of 19-24  Normal Nutritional Risks: None Diabetes: No  How often do you need to have someone help you when you read instructions, pamphlets, or other written materials from your doctor or pharmacy?: 1 - Never    Interpreter Needed?: No  Information entered by :: Clemetine Marker LPN   Activities of Daily Living In your present state of health, do you have any difficulty performing the following activities: 05/26/2021 03/14/2021  Hearing? N N  Vision? N Y  Difficulty concentrating or making decisions? N N  Walking or climbing stairs? Y N  Dressing or bathing? N N  Doing errands, shopping? N N  Preparing Food and eating ? N -  Using the Toilet? N -  In the past six months, have you accidently leaked urine? N -  Do you have problems with loss of bowel control? N -  Managing your Medications? N -  Managing your Finances? N -  Housekeeping or managing your Housekeeping? N -  Some recent data might be hidden    Patient Care Team: Steele Sizer, MD as PCP - General (Family Medicine) Dingeldein, Remo Lipps, MD as Consulting Physician (Ophthalmology) Alisa Graff, FNP as Consulting Physician (Family Medicine) Jannet Mantis, MD (Dermatology) Minna Merritts, MD as Consulting Physician (Cardiology) Ashok Pall, MD as Consulting Physician (Neurosurgery) Marlaine Hind, MD as Consulting Physician (Physical Medicine and Rehabilitation) Billey Co, MD as Consulting Physician (Urology)  Indicate any recent Medical Services you may have received from other than [facility] providers in the past year (date may be approximate).     Assessment:   This is a routine wellness examination for Marisol.  Hearing/Vision screen Hearing Screening - Comments:: Pt denies hearing difficulty Vision Screening - Comments:: Annual vision screenings done at [facility]   Dietary issues and exercise activities discussed: Current Exercise Habits: The patient  does not participate in regular exercise at present, Exercise limited by: orthopedic condition(s)   Goals Addressed             This Visit's Progress    Increase water intake  On track    Recommend increasing water intake to 5-6 glasses a day.        Depression Screen PHQ 2/9 Scores 05/26/2021 03/14/2021 12/01/2020 11/18/2020 09/13/2020 09/13/2020 08/02/2020  PHQ - 2 Score 0 0 0 0 0 0 0  PHQ- 9 Score - - - - 1 - -    Fall Risk Fall Risk  05/26/2021 03/14/2021 12/01/2020 11/18/2020 09/13/2020  Falls in the past year? 0 0 0 0 0  Number falls in past yr: 0 0 0 0 0  Injury with Fall? 0 0 0 0 0  Comment - - - - -  Risk for fall due to : No Fall Risks - - - -  Risk for fall due to: Comment - - - - -  Follow up Falls prevention discussed - - - -    FALL RISK PREVENTION PERTAINING TO THE HOME:  Any stairs in or around the home? Yes  If so, are there any without handrails? No  Home free of loose throw rugs in walkways, pet beds, electrical cords, etc? Yes  Adequate lighting in your home to reduce risk of falls? Yes   ASSISTIVE DEVICES UTILIZED TO PREVENT FALLS:  Life alert? No  Use of a cane, walker or w/c? No  Grab bars in the bathroom? Yes  Shower chair or bench in shower? Yes  Elevated toilet seat or a handicapped toilet? No   TIMED UP AND GO:  Was the test performed? Yes .  Length of time to ambulate 10 feet: 7 sec.   Gait slow and steady without use of assistive device  Cognitive Function: Normal cognitive status assessed by direct observation by this Nurse Health Advisor. No abnormalities found.       6CIT Screen 05/25/2020 05/23/2019 05/17/2018 03/12/2017  What Year? 0 points 0 points 0 points 0 points  What month? 0 points 0 points 0 points 0 points  What time? 0 points 0 points 0 points 0 points  Count back from 20 0 points 0 points 0 points 0 points  Months in reverse 0 points 0 points 0 points 0 points  Repeat phrase 0 points 0 points 0 points 0 points  Total Score  0 0 0 0    Immunizations Immunization History  Administered Date(s) Administered   Fluad Quad(high Dose 65+) 08/11/2019   Influenza, High Dose Seasonal PF 07/22/2015, 07/04/2018   Influenza-Unspecified 08/25/2016, 08/13/2017, 08/13/2020   PFIZER(Purple Top)SARS-COV-2 Vaccination 11/13/2019, 12/04/2019, 08/13/2020   Pneumococcal Conjugate-13 09/07/2016   Pneumococcal Polysaccharide-23 12/04/2017    TDAP status: Due, Education has been provided regarding the importance of this vaccine. Advised may receive this vaccine at local pharmacy or Health Dept. Aware to provide a copy of the vaccination record if obtained from local pharmacy or Health Dept. Verbalized acceptance and understanding.  Flu Vaccine status: Up to date  Pneumococcal vaccine status: Up to date  Covid-19 vaccine status: Completed vaccines  Qualifies for Shingles Vaccine? Yes   Zostavax completed Yes  per patient Shingrix Completed?: No.    Education has been provided regarding the importance of this vaccine. Patient has been advised to call insurance company to determine out of pocket expense if they have not yet received this vaccine. Advised may also receive vaccine at local pharmacy or Health Dept. Verbalized acceptance and understanding.  Screening Tests Health Maintenance  Topic Date Due   Zoster Vaccines- Shingrix (1 of 2) Never done   TETANUS/TDAP  10/23/2017   COVID-19 Vaccine (4 - Booster  for Johnson series) 11/13/2020   INFLUENZA VACCINE  05/23/2021   DEXA SCAN  Completed   Hepatitis C Screening  Completed   PNA vac Low Risk Adult  Completed   HPV VACCINES  Aged Out    Health Maintenance  Health Maintenance Due  Topic Date Due   Zoster Vaccines- Shingrix (1 of 2) Never done   TETANUS/TDAP  10/23/2017   COVID-19 Vaccine (4 - Booster for Pfizer series) 11/13/2020   INFLUENZA VACCINE  05/23/2021    Colorectal cancer screening: No longer required.   Mammogram status: No longer required due to  age.  Bone Density status: Completed 06/12/17. Results reflect: Bone density results: NORMAL. Repeat every 2 years. Pt declines repeat screening at this time.   Lung Cancer Screening: (Low Dose CT Chest recommended if Age 29-80 years, 30 pack-year currently smoking OR have quit w/in 15years.) does not qualify.  Additional Screening:  Hepatitis C Screening: does qualify; Completed 09/13/20  Vision Screening: Recommended annual ophthalmology exams for early detection of glaucoma and other disorders of the eye. Is the patient up to date with their annual eye exam?  Yes  Who is the provider or what is the name of the office in which the patient attends annual eye exams? [place] Screening: Recommended annual dental exams for proper oral hygiene  Community Resource Referral / Chronic Care Management: CRR required this visit?  No   CCM required this visit?  No      Plan:     I have personally reviewed and noted the following in the patient's chart:   Medical and social history Use of alcohol, tobacco or illicit drugs  Current medications and supplements including opioid prescriptions.  Functional ability and status Nutritional status Physical activity Advanced directives List of other physicians Hospitalizations, surgeries, and ER visits in previous 12 months Vitals Screenings to include cognitive, depression, and falls Referrals and appointments  In addition, I have reviewed and discussed with patient certain preventive protocols, quality metrics, and best practice recommendations. A written personalized care plan for preventive services as well as general preventive health recommendations were provided to patient.     Clemetine Marker, LPN   [id number]   Nurse Notes: none

## 2021-05-27 ENCOUNTER — Telehealth: Payer: Self-pay

## 2021-05-27 NOTE — Progress Notes (Signed)
    Chronic Care Management Pharmacy Assistant   Name: SHERRIANN LANDINO  MRN: [id number] DOB: 07/20/1941  Task received from Junius Argyle, CPP to complete a PAP for Eliquis for this patient.  Eliquis PAP completed for patient.   Spoke to the patient to advise her that I have completed her PAP application. According to the patient she still has not reached her OOP Threshold in order to receive Eliquis at this time. She stated she still feels like she has at least 2 months left before that threshold is meet.   I did advise that patient that I would have the application mailed to her just in case and when she has reached her OOP she can drop it off at the clinic to have [facility] fax it over for her.   According to the patient's chart her Cardiologist Dr. Rockey Situ is the one that prescribes the Eliquis for her and I did advise she would have to have that provider sign the form, but according to the patient Dr. Ancil Boozer has always been the one signing it.   Application emailed to Junius Argyle, CPP for mailing to the patient.  Lynann Bologna, CPA/CMA Clinical Pharmacist Assistant Phone: [phone]

## 2021-06-03 DIAGNOSIS — H2511 Age-related nuclear cataract, right eye: Secondary | ICD-10-CM | POA: Diagnosis not present

## 2021-06-07 ENCOUNTER — Telehealth: Payer: Self-pay

## 2021-06-07 ENCOUNTER — Ambulatory Visit
Admission: RE | Admit: 2021-06-07 | Discharge: 2021-06-07 | Disposition: A | Discharge status: Home / Self Care | Payer: Medicare Other | Source: Ambulatory Visit | Attending: Urology | Admitting: Urology

## 2021-06-07 ENCOUNTER — Other Ambulatory Visit: Payer: Self-pay

## 2021-06-07 DIAGNOSIS — N1339 Other hydronephrosis: Secondary | ICD-10-CM | POA: Diagnosis not present

## 2021-06-07 DIAGNOSIS — N2889 Other specified disorders of kidney and ureter: Secondary | ICD-10-CM | POA: Diagnosis not present

## 2021-06-07 DIAGNOSIS — N289 Disorder of kidney and ureter, unspecified: Secondary | ICD-10-CM | POA: Diagnosis not present

## 2021-06-07 NOTE — Progress Notes (Signed)
    Chronic Care Management Pharmacy Assistant   Name: [name]  MRN: [id number] DOB: [birth year]  Received a task requesting that I contact patient to make sure that she received her PAP application in the mail.  Patient stated that she did receive the application for Eliquis in the mail and when she needs the medication she will take the application up to PCP's office for them to fax for her.  Patient stated there was nothing else she needed at this time.  Lynann Bologna, CPA/CMA Clinical Pharmacist Assistant Phone: [phone]

## 2021-06-09 ENCOUNTER — Encounter: Payer: Self-pay | Admitting: Urology

## 2021-06-09 ENCOUNTER — Ambulatory Visit: Payer: Self-pay | Admitting: Urology

## 2021-06-09 ENCOUNTER — Ambulatory Visit: Payer: Medicare Other | Admitting: Urology

## 2021-06-09 ENCOUNTER — Other Ambulatory Visit: Payer: Self-pay

## 2021-06-09 VITALS — BP 174/73 | HR 55 | Ht 66.0 in | Wt 122.0 lb

## 2021-06-09 DIAGNOSIS — N390 Urinary tract infection, site not specified: Secondary | ICD-10-CM

## 2021-06-09 NOTE — Progress Notes (Signed)
   06/09/2021 1:19 PM   Levander Campion Blanch Media 01-May-1941 [id number]  Reason for visit: Follow up right hydronephrosis, recurrent UTIs   HPI: I saw Ms. Kihn today in urology clinic for follow-up of right-sided hydronephrosis and recurrent UTIs.  To briefly summarize, she is a 80 year old female with congestive heart failure and atrial fibrillation on Eliquis who had 2 episodes of UTI/possible pyelonephritis in May and June 2021.  A non-contrast CT performed at her original episode showed mild right hydroureteronephrosis with no stone or other etiology seen, urine culture from first episode grew Pseudomonas and staph epidermidis.  She had hematuria at the first episode that resolved with antibiotics.  She then improved with antibiotics, however then recurred in early June with with similar urinary symptoms of urgency, frequency, and dysuria.  Renal ultrasound at that visit showed mild right hydronephrosis, and culture showed mixed species.  She improved with antibiotics and was discharged.  Renal function was slightly impaired at that hospitalization with a creatinine of 1.08, EGFR 49, however improved in follow-up after treatment with antibiotics to 0.77, EGFR greater than 60.  We previously had discussed options including diagnostic ureteroscopy versus observation, and she opted for observation, and UTI prevention prophylaxis with cranberry tablets.  She has done extremely well over the last year and really denies any complaints.  She is not having any flank pain or hematuria.  She had a single UTI in November 2021 that was mild.  Renal ultrasound on 06/07/2021 showed no hydronephrosis or other abnormality.  We reviewed these reassuring findings.  At this point she can follow-up as needed, and return precautions were discussed including gross hematuria, flank pain, recurrent UTIs.   [place], [facility] [address], [place], [place] [address] [phone]

## 2021-06-09 NOTE — Patient Instructions (Signed)
Urinary Tract Infection, Adult A urinary tract infection (UTI) is an infection of any part of the urinary tract. The urinary tract includes the kidneys, ureters, bladder, and urethra. These organs make, store, and get rid of urine in the body. An upper UTI affects the ureters and kidneys. A lower UTI affects the bladder and urethra. What are the causes? Most urinary tract infections are caused by bacteria in your genital area around your urethra, where urine leaves your body. These bacteria grow and cause inflammation of your urinary tract. What increases the risk? You are more likely to develop this condition if: You have a urinary catheter that stays in place. You are not able to control when you urinate or have a bowel movement (incontinence). You are female and you: Use a spermicide or diaphragm for birth control. Have low estrogen levels. Are pregnant. You have certain genes that increase your risk. You are sexually active. You take antibiotic medicines. You have a condition that causes your flow of urine to slow down, such as: An enlarged prostate, if you are female. Blockage in your urethra. A kidney stone. A nerve condition that affects your bladder control (neurogenic bladder). Not getting enough to drink, or not urinating often. You have certain medical conditions, such as: Diabetes. A weak disease-fighting system (immunesystem). Sickle cell disease. Gout. Spinal cord injury. What are the signs or symptoms? Symptoms of this condition include: Needing to urinate right away (urgency). Frequent urination. This may include small amounts of urine each time you urinate. Pain or burning with urination. Blood in the urine. Urine that smells bad or unusual. Trouble urinating. Cloudy urine. Vaginal discharge, if you are female. Pain in the abdomen or the lower back. You may also have: Vomiting or a decreased appetite. Confusion. Irritability or tiredness. A fever or  chills. Diarrhea. The first symptom in older adults may be confusion. In some cases, they may not have any symptoms until the infection has worsened. How is this diagnosed? This condition is diagnosed based on your medical history and a physical exam. You may also have other tests, including: Urine tests. Blood tests. Tests for STIs (sexually transmitted infections). If you have had more than one UTI, a cystoscopy or imaging studies may be done to determine the cause of the infections. How is this treated? Treatment for this condition includes: Antibiotic medicine. Over-the-counter medicines to treat discomfort. Drinking enough water to stay hydrated. If you have frequent infections or have other conditions such as a kidney stone, you may need to see a health care provider who specializes in the urinary tract (urologist). In rare cases, urinary tract infections can cause sepsis. Sepsis is a life-threatening condition that occurs when the body responds to an infection. Sepsis is treated in the hospital with IV antibiotics, fluids, and other medicines. Follow these instructions at home: Medicines Take over-the-counter and prescription medicines only as told by your health care provider. If you were prescribed an antibiotic medicine, take it as told by your health care provider. Do not stop using the antibiotic even if you start to feel better. General instructions Make sure you: Empty your bladder often and completely. Do not hold urine for long periods of time. Empty your bladder after sex. Wipe from front to back after urinating or having a bowel movement if you are female. Use each tissue only one time when you wipe. Drink enough fluid to keep your urine pale yellow. Keep all follow-up visits. This is important. Contact a health care provider   if: Your symptoms do not get better after 1-2 days. Your symptoms go away and then return. Get help right away if: You have severe pain in your  back or your lower abdomen. You have a fever or chills. You have nausea or vomiting. Summary A urinary tract infection (UTI) is an infection of any part of the urinary tract, which includes the kidneys, ureters, bladder, and urethra. Most urinary tract infections are caused by bacteria in your genital area. Treatment for this condition often includes antibiotic medicines. If you were prescribed an antibiotic medicine, take it as told by your health care provider. Do not stop using the antibiotic even if you start to feel better. Keep all follow-up visits. This is important. This information is not intended to replace advice given to you by your health care provider. Make sure you discuss any questions you have with your health care provider. Document Revised: 05/21/2020 Document Reviewed: 05/21/2020 Elsevier Patient Education  2022 [employer]  

## 2021-06-16 ENCOUNTER — Ambulatory Visit: Payer: Self-pay | Admitting: *Deleted

## 2021-06-16 ENCOUNTER — Other Ambulatory Visit: Payer: Self-pay

## 2021-06-16 ENCOUNTER — Encounter: Payer: Self-pay | Admitting: Emergency Medicine

## 2021-06-16 ENCOUNTER — Ambulatory Visit
Admission: EM | Admit: 2021-06-16 | Discharge: 2021-06-16 | Disposition: A | Discharge status: Home / Self Care | Payer: Medicare Other | Attending: Family | Admitting: Family

## 2021-06-16 DIAGNOSIS — M898X1 Other specified disorders of bone, shoulder: Secondary | ICD-10-CM

## 2021-06-16 DIAGNOSIS — I10 Essential (primary) hypertension: Secondary | ICD-10-CM

## 2021-06-16 DIAGNOSIS — S46811A Strain of other muscles, fascia and tendons at shoulder and upper arm level, right arm, initial encounter: Secondary | ICD-10-CM

## 2021-06-16 MED ORDER — CYCLOBENZAPRINE HCL 10 MG PO TABS: ORAL_TABLET | ORAL | 0 refills | Status: DC

## 2021-06-16 NOTE — ED Triage Notes (Signed)
Pt c/o right shoulder pain. Started about 4 days ago. She states she was picking up a container of kitty liter and the next morning it was hurting. She states the pain starts in her right shoulder blade and runs to her elbow.

## 2021-06-16 NOTE — Telephone Encounter (Signed)
Reason for Disposition  [1] After 3 days AND [2] pain not improving  Answer Assessment - Initial Assessment Questions 1. MECHANISM: "How did the injury happen?"     My right shoulder blade is hurting since last weekend and going down my right arm.   I think I pulled something.   No falls.   2. ONSET: "When did the injury happen?" (Minutes or hours ago)      I have a cat.   I picked up a heavy bag of litter and I think it pulled something. 3. APPEARANCE of INJURY: "What does the injury look like?"      No bruising 4. SEVERITY: "Can you move the shoulder normally?"      I can move it but it hurts really bad 5. SIZE: For cuts, bruises, or swelling, ask: "How large is it?" (e.g., inches or centimeters;  entire joint)      None 6. PAIN: "Is there pain?" If Yes, ask: "How bad is the pain?"   (e.g., Scale 1-10; or mild, moderate, severe)   - MILD (1-3): doesn't interfere with normal activities   - MODERATE (4-7): interferes with normal activities (e.g., work or school) or awakens from sleep   - SEVERE (8-10): excruciating pain, unable to do any normal activities, unable to move arm at all due to pain     10 crying  7. TETANUS: For any breaks in the skin, ask: "When was the last tetanus booster?"     N/A 8. OTHER SYMPTOMS: "Do you have any other symptoms?" (e.g., loss of sensation)     No numbness/tingling down right arm.   Just pain down to elbow. 9. PREGNANCY: "Is there any chance you are pregnant?" "When was your last menstrual period?"     N/A  Protocols used: Shoulder Injury-A-AH

## 2021-06-16 NOTE — Discharge Instructions (Addendum)
Recommend use Flexeril muscle relaxer- take 1/2 tablet every 8 hours during the day and take 1 whole tablet at night as needed for muscle spasms/pain. Continue applying warm compresses to area and alternate with ice as needed. May continue Tylenol '1000mg'$  every 8 hours as needed for pain. Follow-up in 2 to 3 days if not improving or go to the ER ASAP if pain worsens.

## 2021-06-16 NOTE — Telephone Encounter (Signed)
Pt called in c/o right shoulder and shoulder blade pain going down to her elbow in her right arm.   She thinks she may have pulled something last weekend when she picked up a heavy bag of kitty litter.   It's been hurting ever since.   This morning though the pain is so bad "I'm crying and I don't normally cry in pain".    "I think I will go to the ED".   I checked for an appt at [facility] which there was none for today.   I gave her the location of the closest urgent care and suggested she go there instead of the ED.   She was agreeable to that plan.     Her boyfriend is going to take her to the [facility] in [facility] now.

## 2021-06-16 NOTE — ED Provider Notes (Signed)
[facility]    CSN: [id number] Arrival date & time: 06/16/21  0932      History   Chief Complaint Chief Complaint  Patient presents with   Shoulder Pain    right    HPI [name] is a 80 y.o. female.   80 year old female presents with right mid shoulder blade pain that radiates through her right shoulder down to her elbow for the past 4 days.  She did pick up a bucket of kitty litter with her right arm 5 days ago and woke up later that night with pain.  She denies any numbness.  No different pain in her neck or actual shoulder joint.  Yesterday the pain was somewhat better but is hurting more today.  She has taken Tylenol with no relief.  She has used cold and hot pads with some relief.  No previous injury to her right upper back or shoulder.  Has had muscle strain and spasms in the past in which she has used Flexeril with no side effects.  Was given Zanaflex in the past with no relief.  Other chronic health issues include HTN, hyperlipidemia, CHF, A. fib, chronic kidney disease, thyroid disease, arthritis, and anxiety.  Currently on Amiodarone, Eliquis, Cardura, Lasix, Toprol XL, potassium supplement, Crestor, valsartan, Synthroid, Restoril, BuSpar, and Claritin daily and Tylenol prn.   The history is provided by the patient.   Past Medical History:  Diagnosis Date   Anxiety    Bronchitis    Chronic combined systolic (congestive) and diastolic (congestive) heart failure ([place])    a. 02/2018 Echo: EF 45-50%, diff HK, triv AI, mod MR, mildly dil LA/RA, nl RV fxn, mod TR. PASP 40-62mHg.   Chronic kidney disease    DJD (degenerative joint disease), cervical    History of stress test    a. 01/2008 MV: EF 76%. Fair ex tol. No ischemia.   Hyperlipidemia    Hypertension    Hypothyroid    LBBB (left bundle branch block)    Leg pain    a. 01/2018 ABI's wnl.   Lumbar spondylosis    Persistent atrial fibrillation ([facility]    a. 01/2018 Event monitor: 45 runs of SVT, longest  14.5 sec. SVT felt to be Afib/flutter-->2% burden. Longest run of AF 4h 231mCHA2DS2VASc = 5-->Xarelto); b. 03/2019 s/p DCCV; c. 04/2019 Repeat Amio load and DCCV.   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   Pulmonary hypertension, unspecified ([facility]06/14/2019   Paroxysmal sinus tachycardia ([facility]04/06/2018   Leg pain 01/04/2018   PAD (peripheral artery disease) ([facility]03/15/2019   Carotid artery calcification, bilateral 12/19/2017   Cervical radiculitis 12/19/2017   Hyperglycemia 03/06/2016   Hypothyroid 04/21/2015   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   Anxiety 04/21/2015   Hyperlipidemia 04/21/2015   Diuretic-induced hypokalemia 04/21/2015   Palpitations 12/18/2012   Hypertension 05/27/2012    Past Surgical History:  Procedure Laterality Date   BACK SURGERY  07/2019   CARDIOVERSION  N/A 03/26/2018   Procedure: CARDIOVERSION;  Surgeon: Nelva Bush, MD;  Location: [facility] ORS;  Service: Cardiovascular;  Laterality: N/A;   CARDIOVERSION N/A 04/24/2018   Procedure: CARDIOVERSION;  Surgeon: Minna Merritts, MD;  Location: [facility] ORS;  Service: Cardiovascular;  Laterality: N/A;   ELECTROPHYSIOLOGY STUDY N/A 02/27/2012   Procedure: ELECTROPHYSIOLOGY STUDY;  Surgeon: Evans Lance, MD;  Location: [facility] CATH LAB;  Service: Cardiovascular;  Laterality: N/A;   EPS and ablation for SVT  5/13   slow pathway ablation by Dr Lovena Le   EYE SURGERY     surgery for detatched retina   NECK SURGERY   02/2020   SUPRAVENTRICULAR TACHYCARDIA ABLATION N/A 02/27/2012   Procedure: SUPRAVENTRICULAR TACHYCARDIA ABLATION;  Surgeon: Evans Lance, MD;  Location: [facility] C[facility] LAB;  Service: Cardiovascular;  Laterality: N/A;    OB History   No obstetric history on file.      Home Medications    Prior to Admission medications   Medication Sig Start Date End Date Taking? Authorizing Provider  acetaminophen (TYLENOL) 650 MG CR tablet Take 650-1,300 mg by mouth every 8 (eight) hours as needed for pain.    Yes [provider]  amiodarone (PACERONE) 200 MG tablet TAKE 1 TABLET(200 MG) BY MOUTH DAILY 01/03/21  Yes Gollan, Kathlene November, MD  apixaban (ELIQUIS) 5 MG TABS tablet TAKE 1 TABLET(5 MG) BY MOUTH TWICE DAILY 02/16/21  Yes Gollan, Kathlene November, MD  busPIRone (BUSPAR) 5 MG tablet TAKE 1 TABLET(5 MG) BY MOUTH TWICE DAILY 04/21/21  Yes Sowles, Drue Stager, MD  cholecalciferol (VITAMIN D) 1000 UNITS tablet Take 2,000 Units by mouth daily.   Yes [provider]  Cranberry 500 MG TABS Take 2 tablets by mouth daily.   Yes [provider]  cyclobenzaprine (FLEXERIL) 10 MG tablet Take 1/2 tablet by mouth every 8 hours as needed and may take 1 whole tablet at night as needed for muscle spasms/pain. 06/16/21  Yes Wahneta Derocher, Nicholes Stairs, NP  doxazosin (CARDURA) 1 MG tablet Take 1 tablet (1 mg total) by mouth every evening. 11/05/20  Yes Gollan, Kathlene November, MD  furosemide (LASIX) 20 MG tablet Take 1 tablet (20 mg total) by mouth See admin instructions. Take 1 tablet ('20mg'$ ) by mouth every morning - take 1 additional tablet ('20mg'$ ) after lunch if needed for swelling 11/05/20  Yes Gollan, Kathlene November, MD  levothyroxine (SYNTHROID) 137 MCG tablet Take 1 tablet (137 mcg total) by mouth daily before breakfast. 03/14/21  Yes Sowles, Drue Stager, MD  loratadine (CLARITIN) 10 MG tablet Take 10 mg by mouth daily.   Yes [provider]  metoprolol succinate (TOPROL-XL) 50 MG 24 hr tablet Take with or immediately following a  meal. 11/05/20  Yes Gollan, Kathlene November, MD  Multiple Vitamin (MULITIVITAMIN WITH MINERALS) TABS Take 1 tablet by mouth daily.   Yes [provider]  potassium chloride (KLOR-CON) 10 MEQ tablet TAKE 1 TABLET BY MOUTH EVERY DAY AS DIRECTED. MAY TAKE AN EXTRA PILL AFTER LUNCH WITH FLUID PILL AS NEEDED FOR SWELLING 11/05/20  Yes Gollan, Kathlene November, MD  rosuvastatin (CRESTOR) 40 MG tablet TAKE 1 TABLET(40 MG) BY MOUTH DAILY 01/03/21  Yes Sowles, Drue Stager, MD  temazepam (RESTORIL) 15 MG capsule TAKE 1 CAPSULE(15 MG) BY MOUTH AT BEDTIME AS NEEDED FOR SLEEP 03/23/21  Yes Sowles, Drue Stager, MD  valsartan (DIOVAN) 160 MG tablet Take 1 tablet (160 mg total) by mouth daily. Take in the morning 11/05/20  Yes Gollan, Kathlene November, MD    Family History  Family History  Problem Relation Age of Onset   Alzheimer's disease Mother    Stroke Father    Breast cancer Father    Heart disease Father        Pacemaker   Breast cancer Paternal Aunt    Kidney cancer Maternal Grandmother    Alzheimer's disease Maternal Grandfather    Thyroid disease Paternal Grandmother    Stroke Paternal Grandfather     Social History Social History   Tobacco Use   Smoking status: Never   Smokeless tobacco: Never   Tobacco comments:    smoking cessation materials not required  Vaping Use   Vaping Use: Never used  Substance Use Topics   Alcohol use: No   Drug use: No     Allergies   Chocolate, Penicillins, Codeine, Erythromycin, and Septra [sulfamethoxazole-trimethoprim]   Review of Systems Review of Systems  Constitutional:  Negative for activity change, appetite change, chills, fatigue and fever.  HENT:  Negative for facial swelling and trouble swallowing.   Eyes:  Positive for visual disturbance (did not wear her contacts today).  Respiratory:  Negative for chest tightness and shortness of breath.   Gastrointestinal:  Negative for nausea and vomiting.  Musculoskeletal:  Positive for arthralgias, back pain, myalgias  and neck pain (chronic). Negative for neck stiffness.  Skin:  Negative for color change and rash.  Allergic/Immunologic: Positive for environmental allergies and food allergies.  Neurological:  Negative for dizziness, tremors, seizures, syncope, weakness and numbness.  Hematological:  Negative for adenopathy. Bruises/bleeds easily.    Physical Exam Triage Vital Signs ED Triage Vitals [06/16/21 0949]  Enc Vitals Group     BP (!) 188/55     Pulse Rate 75     Resp 18     Temp (!) 97.5 F (36.4 C)     Temp Source Oral     SpO2 99 %     Weight 121 lb 14.6 oz (55.3 kg)     Height '5\' 6"'$  (1.676 m)     Head Circumference      Peak Flow      Pain Score 10     Pain Loc      Pain Edu?      Excl. in [place]?    No data found.  Updated Vital Signs BP (!) 188/55 (BP Location: Left Arm)   Pulse 75   Temp (!) 97.5 F (36.4 C) (Oral)   Resp 18   Ht '5\' 6"'$  (1.676 m)   Wt 121 lb 14.6 oz (55.3 kg)   SpO2 99%   BMI 19.68 kg/m   Visual Acuity Right Eye Distance:   Left Eye Distance:   Bilateral Distance:    Right Eye Near:   Left Eye Near:    Bilateral Near:     Physical Exam Vitals and nursing note reviewed.  Constitutional:      General: She is awake. She is not in acute distress.    Appearance: She is well-developed and well-groomed.     Comments: She is sitting in the exam chair in no acute distress but appears uncomfortable when moving her right shoulder and arm due to pain. Also needs assistance with walking and changing positions due to not wearing her contacts and slightly unstable in gait due to decreased vision.   HENT:     Head: Normocephalic and atraumatic.     Right Ear: Hearing normal.     Left Ear: Hearing normal.  Eyes:     Extraocular  Movements: Extraocular movements intact.     Conjunctiva/sclera: Conjunctivae normal.  Cardiovascular:     Rate and Rhythm: Normal rate.  Pulmonary:     Effort: Pulmonary effort is normal.     Breath sounds: Normal breath sounds.   Musculoskeletal:        General: Tenderness present.     Cervical back: Neck supple. No swelling or rigidity.     Thoracic back: Tenderness present. No swelling or signs of trauma. Normal range of motion.     Lumbar back: Normal.       Back:     Comments: Has full range of motion of thoracic back but pain in right upper scapula with abduction of right shoulder and rotation across chest. No distinct tenderness in shoulder joint or elbow. No swelling or rash in upper back. No change in baseline for cervical back. No numbness or neuro deficits noted. Good hand strength and pulses.   Skin:    General: Skin is warm and dry.     Capillary Refill: Capillary refill takes less than 2 seconds.     Findings: No erythema or rash.  Neurological:     General: No focal deficit present.     Mental Status: She is alert and oriented to person, place, and time.     Sensory: Sensation is intact. No sensory deficit.     Motor: Motor function is intact.  Psychiatric:        Mood and Affect: Mood normal.        Behavior: Behavior normal. Behavior is cooperative.        Thought Content: Thought content normal.        Judgment: Judgment normal.     UC Treatments / Results  Labs (all labs ordered are listed, but only abnormal results are displayed) Labs Reviewed - No data to display  EKG   Radiology No results found.  Procedures Procedures (including critical care time)  Medications Ordered in UC Medications - No data to display  Initial Impression / Assessment and Plan / UC Course  I have reviewed the triage vital signs and the nursing notes.  Pertinent labs & imaging results that were available during my care of the patient were reviewed by me and considered in my medical decision making (see chart for details).     Reviewed with patient that she appears to have a right upper trapezius muscle strain.  Unable to take NSAIDs due to chronic health issues and current medications.  Also do not  want to prescribe prednisone due to elevated blood pressure.  Although not recommended in older adults, I believe the benefits outweigh the risks to trial Flexeril since she has taken this before without any issues.  May take Flexeril 10 mg-take half a tablet every 8 hours during the day and 1 whole tablet at night as needed for muscle spasm/pain.  Continue to apply warm compresses to area and alternate with ice as needed for comfort.  May continue Tylenol 1000 mg every 8 hours as needed for pain.  Recommend follow-up with her PCP in 2 to 3 days if not improving or go to the ER ASAP if pain worsens. Final Clinical Impressions(s) / UC Diagnoses   Final diagnoses:  Shoulder blade pain  Trapezius muscle strain, right, initial encounter  Elevated blood pressure reading with diagnosis of hypertension     Discharge Instructions      Recommend use Flexeril muscle relaxer- take 1/2 tablet every 8 hours during the day  and take 1 whole tablet at night as needed for muscle spasms/pain. Continue applying warm compresses to area and alternate with ice as needed. May continue Tylenol '1000mg'$  every 8 hours as needed for pain. Follow-up in 2 to 3 days if not improving or go to the ER ASAP if pain worsens.      ED Prescriptions     Medication Sig Dispense Auth. Provider   cyclobenzaprine (FLEXERIL) 10 MG tablet Take 1/2 tablet by mouth every 8 hours as needed and may take 1 whole tablet at night as needed for muscle spasms/pain. 15 tablet Rene Gonsoulin, Nicholes Stairs, NP      PDMP not reviewed this encounter.   Katy Apo, NP 06/17/21 1209

## 2021-06-22 ENCOUNTER — Other Ambulatory Visit: Payer: Self-pay

## 2021-06-22 ENCOUNTER — Ambulatory Visit
Admission: EM | Admit: 2021-06-22 | Discharge: 2021-06-22 | Disposition: A | Discharge status: Home / Self Care | Payer: Medicare Other | Attending: Emergency Medicine | Admitting: Emergency Medicine

## 2021-06-22 DIAGNOSIS — S29019D Strain of muscle and tendon of unspecified wall of thorax, subsequent encounter: Secondary | ICD-10-CM

## 2021-06-22 MED ORDER — BACLOFEN 10 MG PO TABS: 10.0000 mg | ORAL_TABLET | Freq: Three times a day (TID) | ORAL | 0 refills | Status: DC

## 2021-06-22 MED ORDER — METHYLPREDNISOLONE 4 MG PO TBPK: ORAL_TABLET | ORAL | 0 refills | Status: DC

## 2021-06-22 NOTE — ED Provider Notes (Signed)
[facility]    CSN: [id number] Arrival date & time: 06/22/21  [id number]      History   Chief Complaint Chief Complaint  Patient presents with   Shoulder Pain    right    HPI [name] is a 80 y.o. female.   HPI  80 year old female here for evaluation of continued right shoulder pain.  Patient was evaluated in this urgent care 6 days ago for right shoulder pain and was diagnosed with trapezius strain and discharged home on Flexeril.  She reports that when she takes a full tablet Flexeril at night she gets some relief but the half tablet throughout the day does not provide any relief and her pain is actually worsening.  She has been supplementing with Tylenol without any effects as well.  She denies any numbness, tingling, or weakness in her arm but her biggest complaint is just the pain.  She has not had any accidents or falls.  Patient does wear a contact in her right thigh but has not been able to put her contacts in secondary to the pain of moving her right arm.  Past Medical History:  Diagnosis Date   Anxiety    Bronchitis    Chronic combined systolic (congestive) and diastolic (congestive) heart failure ([place])    a. 02/2018 Echo: EF 45-50%, diff HK, triv AI, mod MR, mildly dil LA/RA, nl RV fxn, mod TR. PASP 40-27mHg.   Chronic kidney disease    DJD (degenerative joint disease), cervical    History of stress test    a. 01/2008 MV: EF 76%. Fair ex tol. No ischemia.   Hyperlipidemia    Hypertension    Hypothyroid    LBBB (left bundle branch block)    Leg pain    a. 01/2018 ABI's wnl.   Lumbar spondylosis    Persistent atrial fibrillation ([place]    a. 01/2018 Event monitor: 45 runs of SVT, longest 14.5 sec. SVT felt to be Afib/flutter-->2% burden. Longest run of AF 4h 294mCHA2DS2VASc = 5-->Xarelto); b. 03/2019 s/p DCCV; c. 04/2019 Repeat Amio load and DCCV.   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   Pulmonary hypertension, unspecified ([facility]06/14/2019   Paroxysmal sinus tachycardia ([facility]04/06/2018   Leg pain 01/04/2018   PAD (peripheral artery disease) ([facility]03/15/2019   Carotid artery calcification, bilateral 12/19/2017   Cervical radiculitis 12/19/2017   Hyperglycemia 03/06/2016   Hypothyroid 04/21/2015   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   Anxiety 04/21/2015   Hyperlipidemia 04/21/2015   Diuretic-induced hypokalemia 04/21/2015   Palpitations 12/18/2012   Hypertension 05/27/2012    Past Surgical History:  Procedure Laterality Date   BACK SURGERY  07/2019   CARDIOVERSION N/A 03/26/2018   Procedure: CARDIOVERSION;  Surgeon: EnNelva BushMD;  Location: [facility] ORS;  Service: Cardiovascular;  Laterality: N/A;   CARDIOVERSION N/A 04/24/2018   Procedure: CARDIOVERSION;  Surgeon: GoMinna MerrittsMD;  Location: [facility] ORS;  Service: Cardiovascular;  Laterality: N/A;  ELECTROPHYSIOLOGY STUDY N/A 02/27/2012   Procedure: ELECTROPHYSIOLOGY STUDY;  Surgeon: Evans Lance, MD;  Location: [facility] CATH LAB;  Service: Cardiovascular;  Laterality: N/A;   EPS and ablation for SVT  5/13   slow pathway ablation by Dr Lovena Le   EYE SURGERY     surgery for detatched retina   NECK SURGERY  02/2020   SUPRAVENTRICULAR TACHYCARDIA ABLATION N/A 02/27/2012   Procedure: SUPRAVENTRICULAR TACHYCARDIA ABLATION;  Surgeon: Evans Lance, MD;  Location: [facility] CATH LAB;  Service: Cardiovascular;  Laterality: N/A;    OB History   No obstetric history on file.       Home Medications    Prior to Admission medications   Medication Sig Start Date End Date Taking? Authorizing Provider  acetaminophen (TYLENOL) 650 MG CR tablet Take 650-1,300 mg by mouth every 8 (eight) hours as needed for pain.    Yes [provider]  amiodarone (PACERONE) 200 MG tablet TAKE 1 TABLET(200 MG) BY MOUTH DAILY 01/03/21  Yes Gollan, Kathlene November, MD  apixaban (ELIQUIS) 5 MG TABS tablet TAKE 1 TABLET(5 MG) BY MOUTH TWICE DAILY 02/16/21  Yes Gollan, Kathlene November, MD  baclofen (LIORESAL) 10 MG tablet Take 1 tablet (10 mg total) by mouth 3 (three) times daily. 06/22/21  Yes Margarette Canada, NP  busPIRone (BUSPAR) 5 MG tablet TAKE 1 TABLET(5 MG) BY MOUTH TWICE DAILY 04/21/21  Yes Sowles, Drue Stager, MD  cholecalciferol (VITAMIN D) 1000 UNITS tablet Take 2,000 Units by mouth daily.   Yes [provider]  Cranberry 500 MG TABS Take 2 tablets by mouth daily.   Yes [provider]  doxazosin (CARDURA) 1 MG tablet Take 1 tablet (1 mg total) by mouth every evening. 11/05/20  Yes Gollan, Kathlene November, MD  furosemide (LASIX) 20 MG tablet Take 1 tablet (20 mg total) by mouth See admin instructions. Take 1 tablet ('20mg'$ ) by mouth every morning - take 1 additional tablet ('20mg'$ ) after lunch if needed for swelling 11/05/20  Yes Gollan, Kathlene November, MD  levothyroxine (SYNTHROID) 137 MCG tablet Take 1 tablet (137 mcg total) by mouth daily before breakfast. 03/14/21  Yes Sowles, Drue Stager, MD  loratadine (CLARITIN) 10 MG tablet Take 10 mg by mouth daily.   Yes [provider]  methylPREDNISolone (MEDROL DOSEPAK) 4 MG TBPK tablet Take according to the package insert. 06/22/21  Yes Margarette Canada, NP  metoprolol succinate (TOPROL-XL) 50 MG 24 hr tablet Take with or immediately following a meal. 11/05/20  Yes Gollan, Kathlene November, MD  Multiple Vitamin (MULITIVITAMIN WITH MINERALS) TABS Take 1 tablet by mouth daily.   Yes [provider]  potassium chloride (KLOR-CON) 10 MEQ tablet TAKE 1  TABLET BY MOUTH EVERY DAY AS DIRECTED. MAY TAKE AN EXTRA PILL AFTER LUNCH WITH FLUID PILL AS NEEDED FOR SWELLING 11/05/20  Yes Gollan, Kathlene November, MD  rosuvastatin (CRESTOR) 40 MG tablet TAKE 1 TABLET(40 MG) BY MOUTH DAILY 01/03/21  Yes Sowles, Drue Stager, MD  temazepam (RESTORIL) 15 MG capsule TAKE 1 CAPSULE(15 MG) BY MOUTH AT BEDTIME AS NEEDED FOR SLEEP 03/23/21  Yes Sowles, Drue Stager, MD  valsartan (DIOVAN) 160 MG tablet Take 1 tablet (160 mg total) by mouth daily. Take in the morning 11/05/20  Yes Gollan, Kathlene November, MD    Family History Family History  Problem Relation Age of Onset   Alzheimer's disease Mother    Stroke Father    Breast cancer Father    Heart disease Father        Pacemaker  Breast cancer Paternal Aunt    Kidney cancer Maternal Grandmother    Alzheimer's disease Maternal Grandfather    Thyroid disease Paternal Grandmother    Stroke Paternal Grandfather     Social History Social History   Tobacco Use   Smoking status: Never   Smokeless tobacco: Never   Tobacco comments:    smoking cessation materials not required  Vaping Use   Vaping Use: Never used  Substance Use Topics   Alcohol use: No   Drug use: No     Allergies   Chocolate, Penicillins, Codeine, Erythromycin, and Septra [sulfamethoxazole-trimethoprim]   Review of Systems Review of Systems  Musculoskeletal:  Positive for arthralgias and myalgias. Negative for joint swelling.  Skin:  Negative for rash.  Neurological:  Negative for weakness and numbness.  Hematological: Negative.   Psychiatric/Behavioral: Negative.      Physical Exam Triage Vital Signs ED Triage Vitals  Enc Vitals Group     BP 06/22/21 0936 (!) 180/55     Pulse Rate 06/22/21 0936 (!) 52     Resp 06/22/21 0936 18     Temp 06/22/21 0936 97.6 F (36.4 C)     Temp Source 06/22/21 0936 Oral     SpO2 06/22/21 0936 100 %     Weight 06/22/21 0934 124 lb (56.2 kg)     Height 06/22/21 0934 '5\' 6"'$  (1.676 m)     Head Circumference --       Peak Flow --      Pain Score 06/22/21 0934 10     Pain Loc --      Pain Edu? --      Excl. in [place]? --    No data found.  Updated Vital Signs BP (!) 180/55 (BP Location: Left Arm)   Pulse (!) 52   Temp 97.6 F (36.4 C) (Oral)   Resp 18   Ht '5\' 6"'$  (1.676 m)   Wt 124 lb (56.2 kg)   SpO2 100%   BMI 20.01 kg/m   Visual Acuity Right Eye Distance:   Left Eye Distance:   Bilateral Distance:    Right Eye Near:   Left Eye Near:    Bilateral Near:     Physical Exam Vitals and nursing note reviewed.  Constitutional:      General: She is not in acute distress.    Appearance: Normal appearance. She is normal weight. She is not ill-appearing.  Cardiovascular:     Rate and Rhythm: Normal rate and regular rhythm.     Pulses: Normal pulses.     Heart sounds: Normal heart sounds. No murmur heard.   No gallop.  Pulmonary:     Effort: Pulmonary effort is normal.     Breath sounds: Normal breath sounds. No wheezing, rhonchi or rales.  Musculoskeletal:        General: Tenderness present. No swelling or deformity. Normal range of motion.  Skin:    General: Skin is warm and dry.     Capillary Refill: Capillary refill takes less than 2 seconds.     Findings: No bruising or erythema.  Neurological:     General: No focal deficit present.     Mental Status: She is alert and oriented to person, place, and time.  Psychiatric:        Mood and Affect: Mood normal.        Behavior: Behavior normal.        Thought Content: Thought content normal.  Judgment: Judgment normal.     UC Treatments / Results  Labs (all labs ordered are listed, but only abnormal results are displayed) Labs Reviewed - No data to display  EKG   Radiology No results found.  Procedures Procedures (including critical care time)  Medications Ordered in UC Medications - No data to display  Initial Impression / Assessment and Plan / UC Course  I have reviewed the triage vital signs and the nursing  notes.  Pertinent labs & imaging results that were available during my care of the patient were reviewed by me and considered in my medical decision making (see chart for details).  Patient is a very pleasant, nontoxic-appearing 80 year old female here for reevaluation of right shoulder pain that has continued since her last visit 6 days ago.  She is not receiving significant benefit from the Flexeril prescribed or from the Tylenol she has been taking.  She cannot take NSAIDs secondary to her chronic kidney disease and high blood pressure.  Patient's physical exam reveals normal carriage and shoulder normal anatomical alignment.  There is tenderness with passive range of motion including posterior extension, external rotation, and abduction.  The pain is not as significant with functional extension, or internal rotation.  Radial and ulnar pulses are 2+.  Grip and upper extremity strength in the right arm is 5/5.  There is no midline spinal tenderness but there is significant thoracic right paraspinous tenderness and spasm.  This covers part of the trapezius and the underlying rhomboid.  Will change therapy from Flexeril to baclofen and start the patient on a low-dose steroid pack to help with inflammation.  Patient also given shoulder range of motion exercise handout for home PT.  Moist heat also encouraged.   Final Clinical Impressions(s) / UC Diagnoses   Final diagnoses:  Thoracic myofascial strain, subsequent encounter     Discharge Instructions      Take the Medrol dose pack according the package instructions. Due to the late hour you can start the medication by taking the breakfast and lunch doses when you fill the medication.  Take the baclofen, 10 mg every 8 hours, on a schedule for the next 48 hours and then as needed.  Apply moist heat to your back for 30 minutes at a time 2-3 times a day to improve blood flow to the area and help remove the lactic acid causing the spasm.  Follow the  shoulder exercises given at discharge.  Return for reevaluation for any new or worsening symptoms.      ED Prescriptions     Medication Sig Dispense Auth. Provider   baclofen (LIORESAL) 10 MG tablet Take 1 tablet (10 mg total) by mouth 3 (three) times daily. 30 each Margarette Canada, NP   methylPREDNISolone (MEDROL DOSEPAK) 4 MG TBPK tablet Take according to the package insert. 1 each Margarette Canada, NP      PDMP not reviewed this encounter.   Margarette Canada, NP 06/22/21 1024

## 2021-06-22 NOTE — ED Triage Notes (Signed)
Pt c/o continued right shoulder pain. Pt states flexeril has helped but the pain persists. Pt states it is worse than last visit. Pt does have some decreased ROM to the shoulder.

## 2021-06-22 NOTE — Discharge Instructions (Addendum)
Take the Medrol dose pack according the package instructions. Due to the late hour you can start the medication by taking the breakfast and lunch doses when you fill the medication.  Take the baclofen, 10 mg every 8 hours, on a schedule for the next 48 hours and then as needed.  Apply moist heat to your back for 30 minutes at a time 2-3 times a day to improve blood flow to the area and help remove the lactic acid causing the spasm.  Follow the shoulder exercises given at discharge.  Return for reevaluation for any new or worsening symptoms.

## 2021-06-28 DIAGNOSIS — H2511 Age-related nuclear cataract, right eye: Secondary | ICD-10-CM | POA: Diagnosis not present

## 2021-07-01 ENCOUNTER — Other Ambulatory Visit: Payer: Self-pay

## 2021-07-06 ENCOUNTER — Other Ambulatory Visit: Payer: Self-pay | Admitting: Family Medicine

## 2021-07-06 DIAGNOSIS — F411 Generalized anxiety disorder: Secondary | ICD-10-CM

## 2021-07-11 NOTE — Discharge Instructions (Signed)

## 2021-07-12 ENCOUNTER — Encounter
Admission: RE | Disposition: A | Discharge status: Home / Self Care | Payer: Self-pay | Source: Home / Self Care | Attending: Ophthalmology

## 2021-07-12 ENCOUNTER — Other Ambulatory Visit: Payer: Self-pay

## 2021-07-12 ENCOUNTER — Ambulatory Visit
Admission: RE | Admit: 2021-07-12 | Discharge: 2021-07-12 | Disposition: A | Discharge status: Home / Self Care | Payer: Medicare Other | Attending: Ophthalmology | Admitting: Ophthalmology

## 2021-07-12 ENCOUNTER — Ambulatory Visit: Payer: Medicare Other | Admitting: Anesthesiology

## 2021-07-12 ENCOUNTER — Encounter: Payer: Self-pay | Admitting: Ophthalmology

## 2021-07-12 DIAGNOSIS — Z79890 Hormone replacement therapy (postmenopausal): Secondary | ICD-10-CM | POA: Insufficient documentation

## 2021-07-12 DIAGNOSIS — Z885 Allergy status to narcotic agent status: Secondary | ICD-10-CM | POA: Insufficient documentation

## 2021-07-12 DIAGNOSIS — Z79899 Other long term (current) drug therapy: Secondary | ICD-10-CM | POA: Diagnosis not present

## 2021-07-12 DIAGNOSIS — Z880 Allergy status to penicillin: Secondary | ICD-10-CM | POA: Diagnosis not present

## 2021-07-12 DIAGNOSIS — H25811 Combined forms of age-related cataract, right eye: Secondary | ICD-10-CM | POA: Diagnosis not present

## 2021-07-12 DIAGNOSIS — Z7901 Long term (current) use of anticoagulants: Secondary | ICD-10-CM | POA: Insufficient documentation

## 2021-07-12 DIAGNOSIS — H2511 Age-related nuclear cataract, right eye: Secondary | ICD-10-CM | POA: Diagnosis not present

## 2021-07-12 HISTORY — PX: CATARACT EXTRACTION W/PHACO: SHX586

## 2021-07-12 SURGERY — PHACOEMULSIFICATION, CATARACT, WITH IOL INSERTION
Anesthesia: Monitor Anesthesia Care | Site: Eye | Laterality: Right

## 2021-07-12 MED ORDER — SIGHTPATH DOSE#1 BSS IO SOLN
INTRAOCULAR | Status: DC | PRN
  Administered 2021-07-12: 15 mL

## 2021-07-12 MED ORDER — BRIMONIDINE TARTRATE-TIMOLOL 0.2-0.5 % OP SOLN
OPHTHALMIC | Status: DC | PRN
  Administered 2021-07-12: 1 [drp] via OPHTHALMIC

## 2021-07-12 MED ORDER — SIGHTPATH DOSE#1 BSS IO SOLN
INTRAOCULAR | Status: DC | PRN
  Administered 2021-07-12: 107 mL via OPHTHALMIC

## 2021-07-12 MED ORDER — ACETAMINOPHEN 160 MG/5ML PO SOLN: 325.0000 mg | Freq: Once | ORAL | Status: DC

## 2021-07-12 MED ORDER — ACETAMINOPHEN 325 MG PO TABS: 325.0000 mg | ORAL_TABLET | Freq: Once | ORAL | Status: DC

## 2021-07-12 MED ORDER — SIGHTPATH DOSE#1 BSS IO SOLN
INTRAOCULAR | Status: DC | PRN
  Administered 2021-07-12: 1 mL via INTRAMUSCULAR

## 2021-07-12 MED ORDER — MOXIFLOXACIN HCL 0.5 % OP SOLN
OPHTHALMIC | Status: DC | PRN
  Administered 2021-07-12: 0.2 mL via OPHTHALMIC

## 2021-07-12 MED ORDER — MIDAZOLAM HCL 2 MG/2ML IJ SOLN
INTRAMUSCULAR | Status: DC | PRN
  Administered 2021-07-12: 1 mg via INTRAVENOUS

## 2021-07-12 MED ORDER — SIGHTPATH DOSE#1 NA CHONDROIT SULF-NA HYALURON 40-17 MG/ML IO SOLN
INTRAOCULAR | Status: DC | PRN
  Administered 2021-07-12: 1 mL via INTRAOCULAR

## 2021-07-12 MED ORDER — LACTATED RINGERS IV SOLN: INTRAVENOUS | Status: DC

## 2021-07-12 MED ORDER — FENTANYL CITRATE (PF) 100 MCG/2ML IJ SOLN
INTRAMUSCULAR | Status: DC | PRN
  Administered 2021-07-12: 50 ug via INTRAVENOUS

## 2021-07-12 MED ORDER — TETRACAINE HCL 0.5 % OP SOLN
1.0000 [drp] | OPHTHALMIC | Status: DC | PRN
  Administered 2021-07-12 (×3): 1 [drp] via OPHTHALMIC

## 2021-07-12 MED ORDER — ARMC OPHTHALMIC DILATING DROPS
1.0000 "application " | OPHTHALMIC | Status: DC | PRN
  Administered 2021-07-12 (×3): 1 via OPHTHALMIC

## 2021-07-12 SURGICAL SUPPLY — 17 items
CANNULA ANT/CHMB 27G (MISCELLANEOUS) ×2 IMPLANT
CANNULA ANT/CHMB 27GA (MISCELLANEOUS) ×4 IMPLANT
GLOVE SURG ENC TEXT LTX SZ8 (GLOVE) ×2 IMPLANT
GLOVE SURG TRIUMPH 8.0 PF LTX (GLOVE) ×2 IMPLANT
GOWN STRL REUS W/ TWL LRG LVL3 (GOWN DISPOSABLE) ×2 IMPLANT
GOWN STRL REUS W/TWL LRG LVL3 (GOWN DISPOSABLE) ×4
LENS IOL ACRSF IQ ULTRA 8.5 (Intraocular Lens) IMPLANT
LENS IOL ACRYSOF IQ 8.5 (Intraocular Lens) ×2 IMPLANT
MARKER SKIN DUAL TIP RULER LAB (MISCELLANEOUS) ×2 IMPLANT
NDL FILTER BLUNT 18X1 1/2 (NEEDLE) ×1 IMPLANT
NEEDLE FILTER BLUNT 18X 1/2SAF (NEEDLE) ×1
NEEDLE FILTER BLUNT 18X1 1/2 (NEEDLE) ×1 IMPLANT
PACK EYE AFTER SURG (MISCELLANEOUS) ×2 IMPLANT
SYR 3ML LL SCALE MARK (SYRINGE) ×2 IMPLANT
SYR TB 1ML LUER SLIP (SYRINGE) ×2 IMPLANT
WATER STERILE IRR 250ML POUR (IV SOLUTION) ×2 IMPLANT
WIPE NON LINTING 3.25X3.25 (MISCELLANEOUS) ×2 IMPLANT

## 2021-07-12 NOTE — Op Note (Signed)
PREOPERATIVE DIAGNOSIS:  Nuclear sclerotic cataract of the right eye.   POSTOPERATIVE DIAGNOSIS:  H25.11 Cataract   OPERATIVE PROCEDURE:ORPROCALL@   SURGEON:  Birder Robson, MD.   ANESTHESIA:  Anesthesiologist: Ronelle Nigh, MD CRNA: Jeannene Patella, CRNA  1.      Managed anesthesia care. 2.      0.45ml of Shugarcaine was instilled in the eye following the paracentesis.   COMPLICATIONS:  None.   TECHNIQUE:   Stop and chop   DESCRIPTION OF PROCEDURE:  The patient was examined and consented in the preoperative holding area where the aforementioned topical anesthesia was applied to the right eye and then brought back to the Operating Room where the right eye was prepped and draped in the usual sterile ophthalmic fashion and a lid speculum was placed. A paracentesis was created with the side port blade and the anterior chamber was filled with viscoelastic. A near clear corneal incision was performed with the steel keratome. A continuous curvilinear capsulorrhexis was performed with a cystotome followed by the capsulorrhexis forceps. Hydrodissection and hydrodelineation were carried out with BSS on a blunt cannula. The lens was removed in a stop and chop  technique and the remaining cortical material was removed with the irrigation-aspiration handpiece. The capsular bag was inflated with viscoelastic and the Technis [id number]  lens was placed in the capsular bag without complication. The remaining viscoelastic was removed from the eye with the irrigation-aspiration handpiece. The wounds were hydrated. The anterior chamber was flushed with BSS and the eye was inflated to physiologic pressure. 0.76ml of Vigamox was placed in the anterior chamber. The wounds were found to be water tight. The eye was dressed with Combigan. The patient was given protective glasses to wear throughout the day and a shield with which to sleep tonight. The patient was also given drops with which to begin a drop regimen  today and will follow-up with me in one day. Implant Name Type Inv. Item Serial No. Manufacturer Lot No. LRB No. Used Action  LENS IOL ACRYSOF IQ 8.5 - [id number] Intraocular Lens LENS IOL ACRYSOF IQ 8.5 [id number] [employer]  Right 1 Implanted   Procedure(s): CATARACT EXTRACTION PHACO AND INTRAOCULAR LENS PLACEMENT (IOC) RIGHT 16.92 01:30.8  (Right)  Electronically signed: Birder Robson 07/12/2021 11:31 AM

## 2021-07-12 NOTE — Anesthesia Postprocedure Evaluation (Signed)
Anesthesia Post Note  Patient: [name]  Procedure(s) Performed: CATARACT EXTRACTION PHACO AND INTRAOCULAR LENS PLACEMENT (IOC) RIGHT 16.92 01:30.8  (Right: Eye)     Patient location during evaluation: PACU Anesthesia Type: MAC Level of consciousness: awake and alert and oriented Pain management: satisfactory to patient Vital Signs Assessment: post-procedure vital signs reviewed and stable Respiratory status: spontaneous breathing, nonlabored ventilation and respiratory function stable Cardiovascular status: blood pressure returned to baseline and stable Postop Assessment: Adequate PO intake and No signs of nausea or vomiting Anesthetic complications: no   No notable events documented.  Raliegh Ip

## 2021-07-12 NOTE — Transfer of Care (Signed)
Immediate Anesthesia Transfer of Care Note  Patient: [name]  Procedure(s) Performed: CATARACT EXTRACTION PHACO AND INTRAOCULAR LENS PLACEMENT (IOC) RIGHT 16.92 01:30.8  (Right: Eye)  Patient Location: PACU  Anesthesia Type: MAC  Level of Consciousness: awake, alert  and patient cooperative  Airway and Oxygen Therapy: Patient Spontanous Breathing and Patient connected to supplemental oxygen  Post-op Assessment: Post-op Vital signs reviewed, Patient's Cardiovascular Status Stable, Respiratory Function Stable, Patent Airway and No signs of Nausea or vomiting  Post-op Vital Signs: Reviewed and stable  Complications: No notable events documented.

## 2021-07-12 NOTE — Anesthesia Procedure Notes (Signed)
Procedure Name: MAC Date/Time: 07/12/2021 11:17 AM Performed by: Jeannene Patella, CRNA Pre-anesthesia Checklist: Patient identified, Emergency Drugs available, Suction available, Timeout performed and Patient being monitored Patient Re-evaluated:Patient Re-evaluated prior to induction Oxygen Delivery Method: Nasal cannula Placement Confirmation: positive ETCO2

## 2021-07-12 NOTE — H&P (Signed)
[name]   Primary Care Physician:  Steele Sizer, MD Ophthalmologist: Dr. George Ina  Pre-Procedure History & Physical: HPI:  [name] is a 80 y.o. female here for cataract surgery.   Past Medical History:  Diagnosis Date   Anxiety    Bronchitis    Chronic combined systolic (congestive) and diastolic (congestive) heart failure ([place])    a. 02/2018 Echo: EF 45-50%, diff HK, triv AI, mod MR, mildly dil LA/RA, nl RV fxn, mod TR. PASP 40-24mmHg.   Chronic kidney disease    DJD (degenerative joint disease), cervical    History of stress test    a. 01/2008 MV: EF 76%. Fair ex tol. No ischemia.   Hyperlipidemia    Hypertension    Hypothyroid    LBBB (left bundle branch block)    Leg pain    a. 01/2018 ABI's wnl.   Lumbar spondylosis    Persistent atrial fibrillation ([place])    a. 01/2018 Event monitor: 45 runs of SVT, longest 14.5 sec. SVT felt to be Afib/flutter-->2% burden. Longest run of AF 4h 40m (CHA2DS2VASc = 5-->Xarelto); b. 03/2019 s/p DCCV; c. 04/2019 Repeat Amio load and DCCV.   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    Past Surgical History:  Procedure Laterality Date   BACK SURGERY  07/2019   CARDIOVERSION N/A 03/26/2018   Procedure: CARDIOVERSION;  Surgeon: Nelva Bush, MD;  Location: [facility] ORS;  Service: Cardiovascular;  Laterality: N/A;   CARDIOVERSION N/A 04/24/2018   Procedure: CARDIOVERSION;  Surgeon: Minna Merritts, MD;  Location: [facility];  Service: Cardiovascular;  Laterality: N/A;   ELECTROPHYSIOLOGY STUDY N/A 02/27/2012   Procedure: ELECTROPHYSIOLOGY STUDY;  Surgeon: Evans Lance, MD;  Location: [facility] CATH LAB;  Service: Cardiovascular;  Laterality: N/A;   EPS and ablation for SVT  5/13   slow pathway ablation by Dr Lovena Le   EYE SURGERY     surgery for detatched retina   NECK SURGERY  02/2020   SUPRAVENTRICULAR TACHYCARDIA ABLATION N/A 02/27/2012   Procedure: SUPRAVENTRICULAR TACHYCARDIA ABLATION;   Surgeon: Evans Lance, MD;  Location: [facility] CATH LAB;  Service: Cardiovascular;  Laterality: N/A;    Prior to Admission medications   Medication Sig Start Date End Date Taking? Authorizing Provider  acetaminophen (TYLENOL) 650 MG CR tablet Take 650-1,300 mg by mouth every 8 (eight) hours as needed for pain.    Yes [provider]  amiodarone (PACERONE) 200 MG tablet TAKE 1 TABLET(200 MG) BY MOUTH DAILY 01/03/21  Yes Gollan, Kathlene November, MD  apixaban (ELIQUIS) 5 MG TABS tablet TAKE 1 TABLET(5 MG) BY MOUTH TWICE DAILY 02/16/21  Yes Gollan, Kathlene November, MD  busPIRone (BUSPAR) 5 MG tablet TAKE 1 TABLET(5 MG) BY MOUTH TWICE DAILY 07/06/21  Yes Sowles, Drue Stager, MD  cholecalciferol (VITAMIN D) 1000 UNITS tablet Take 2,000 Units by mouth daily.   Yes [provider]  Cranberry 500 MG TABS Take 2 tablets by mouth daily.   Yes [provider]  doxazosin (CARDURA) 1 MG tablet Take 1 tablet (1 mg total) by mouth every evening. 11/05/20  Yes Gollan, Kathlene November, MD  furosemide (LASIX) 20 MG tablet Take 1 tablet (20 mg total) by mouth See admin instructions. Take 1 tablet (20mg ) by mouth every morning - take 1 additional tablet (20mg ) after lunch if needed for swelling 11/05/20  Yes Gollan, Kathlene November, MD  levothyroxine (SYNTHROID) 137 MCG tablet Take 1 tablet (137 mcg total)  by mouth daily before breakfast. 03/14/21  Yes Sowles, Drue Stager, MD  loratadine (CLARITIN) 10 MG tablet Take 10 mg by mouth daily.   Yes [provider]  methylPREDNISolone (MEDROL DOSEPAK) 4 MG TBPK tablet Take according to the package insert. 06/22/21  Yes Margarette Canada, NP  Multiple Vitamin (MULITIVITAMIN WITH MINERALS) TABS Take 1 tablet by mouth daily.   Yes [provider]  potassium chloride (KLOR-CON) 10 MEQ tablet TAKE 1 TABLET BY MOUTH EVERY DAY AS DIRECTED. MAY TAKE AN EXTRA PILL AFTER LUNCH WITH FLUID PILL AS NEEDED FOR SWELLING 11/05/20  Yes Gollan, Kathlene November, MD  rosuvastatin (CRESTOR) 40 MG tablet  TAKE 1 TABLET(40 MG) BY MOUTH DAILY 01/03/21  Yes Sowles, Drue Stager, MD  temazepam (RESTORIL) 15 MG capsule TAKE 1 CAPSULE(15 MG) BY MOUTH AT BEDTIME AS NEEDED FOR SLEEP 03/23/21  Yes Sowles, Drue Stager, MD  valsartan (DIOVAN) 160 MG tablet Take 1 tablet (160 mg total) by mouth daily. Take in the morning 11/05/20  Yes Gollan, Kathlene November, MD  baclofen (LIORESAL) 10 MG tablet Take 1 tablet (10 mg total) by mouth 3 (three) times daily. Patient not taking: Reported on 07/01/2021 06/22/21   Margarette Canada, NP  metoprolol succinate (TOPROL-XL) 50 MG 24 hr tablet Take with or immediately following a meal. Patient not taking: Reported on 07/01/2021 11/05/20   Minna Merritts, MD    Allergies as of 06/09/2021 - Review Complete 06/09/2021  Allergen Reaction Noted   Chocolate Swelling 10/30/2020   Penicillins Swelling and Rash 02/27/2012   Codeine Nausea And Vomiting 02/27/2012   Erythromycin Itching and Swelling 02/27/2012   Septra [sulfamethoxazole-trimethoprim] Swelling 02/27/2012    Family History  Problem Relation Age of Onset   Alzheimer's disease Mother    Stroke Father    Breast cancer Father    Heart disease Father        Pacemaker   Breast cancer Paternal Aunt    Kidney cancer Maternal Grandmother    Alzheimer's disease Maternal Grandfather    Thyroid disease Paternal Grandmother    Stroke Paternal Grandfather     Social History   Socioeconomic History   Marital status: Widowed    Spouse name: Not on file   Number of children: 1   Years of education: Not on file   Highest education level: 12th grade  Occupational History   Occupation: Retired  Tobacco Use   Smoking status: Never   Smokeless tobacco: Never   Tobacco comments:    smoking cessation materials not required  Vaping Use   Vaping Use: Never used  Substance and Sexual Activity   Alcohol use: No   Drug use: No   Sexual activity: Not Currently    Partners: Male    Birth control/protection: Post-menopausal  Other Topics  Concern   Not on file  Social History Narrative   Lives in [place], she has a boyfriend   She has cats, some inside, some outside    Social Determinants of Health   Financial Resource Strain: Low Risk    Difficulty of Paying Living Expenses: Not hard at all  Food Insecurity: No Food Insecurity   Worried About Charity fundraiser in the Last Year: Never true   Arboriculturist in the Last Year: Never true  Transportation Needs: No Transportation Needs   Lack of Transportation (Medical): No   Lack of Transportation (Non-Medical): No  Physical Activity: Inactive   Days of Exercise per Week: 0 days   Minutes of Exercise  per Session: 0 min  Stress: No Stress Concern Present   Feeling of Stress : Only a little  Social Connections: Moderately Integrated   Frequency of Communication with Friends and Family: More than three times a week   Frequency of Social Gatherings with Friends and Family: More than three times a week   Attends Religious Services: More than 4 times per year   Active Member of [employer] or Organizations: Yes   Attends Archivist Meetings: More than 4 times per year   Marital Status: Widowed  Human resources officer Violence: Not At Risk   Fear of Current or Ex-Partner: No   Emotionally Abused: No   Physically Abused: No   Sexually Abused: No    Review of Systems: See HPI, otherwise negative ROS  Physical Exam: BP (!) 181/48   Pulse 62   Temp 97.9 F (36.6 C) (Temporal)   Resp 18   Ht 5\' 6"  (1.676 m)   Wt 54 kg   SpO2 97%   BMI 19.21 kg/m  General:   Alert, cooperative in NAD Head:  Normocephalic and atraumatic. Respiratory:  Normal work of breathing. Cardiovascular:  RRR  Impression/Plan: [name] is here for cataract surgery.  Risks, benefits, limitations, and alternatives regarding cataract surgery have been reviewed with the patient.  Questions have been answered.  All parties agreeable.   Birder Robson, MD  07/12/2021, 10:59 AM

## 2021-07-12 NOTE — Anesthesia Preprocedure Evaluation (Signed)
Anesthesia Evaluation  Patient identified by MRN, date of birth, ID band Patient awake    Reviewed: Allergy & Precautions, H&P , NPO status , Patient's Chart, lab work & pertinent test results  Airway Mallampati: II  TM Distance: >3 FB Neck ROM: full    Dental no notable dental hx.    Pulmonary    Pulmonary exam normal breath sounds clear to auscultation       Cardiovascular hypertension, + Peripheral Vascular Disease and +CHF  Normal cardiovascular exam+ dysrhythmias + Valvular Problems/Murmurs  Rhythm:regular Rate:Normal  pHTN   Neuro/Psych PSYCHIATRIC DISORDERS    GI/Hepatic   Endo/Other  Hypothyroidism   Renal/GU Renal disease     Musculoskeletal   Abdominal   Peds  Hematology   Anesthesia Other Findings   Reproductive/Obstetrics                             Anesthesia Physical Anesthesia Plan  ASA: 3  Anesthesia Plan: MAC   Post-op Pain Management:    Induction:   PONV Risk Score and Plan: 2 and Treatment may vary due to age or medical condition, TIVA and Midazolam  Airway Management Planned:   Additional Equipment:   Intra-op Plan:   Post-operative Plan:   Informed Consent: I have reviewed the patients History and Physical, chart, labs and discussed the procedure including the risks, benefits and alternatives for the proposed anesthesia with the patient or authorized representative who has indicated his/her understanding and acceptance.     Dental Advisory Given  Plan Discussed with: CRNA  Anesthesia Plan Comments:         Anesthesia Quick Evaluation

## 2021-07-13 ENCOUNTER — Encounter: Payer: Self-pay | Admitting: Ophthalmology

## 2021-07-14 ENCOUNTER — Telehealth: Payer: Self-pay

## 2021-07-14 NOTE — Progress Notes (Signed)
Chronic Care Management Pharmacy Assistant   Name: [name]  MRN: [id number] DOB: 01/28/1941  Reason for Encounter: Hypertension and HLD Disease State Call   Recent office visits:  05/26/2021 Leslie Marker, LPN (PCP Office) for Medicare Wellness Exam- No medication changes noted  Recent consult visits:  06/09/2021 Leslie Madrid, MD (Urology) for Follow-up- No medication changes noted; no orders placed; no follow-up noted.  Hospital visits:  Medication Reconciliation was completed by comparing discharge summary, patient's EMR and Pharmacy list, and upon discussion with patient.  Admitted to [facility] on 07/12/2021 due to Cataract Extraction. Discharge date was 07/12/2021. Discharged from [facility].    New?Medications Started at [facility] Discharge:?? -started None ID  Medication Changes at Hospital Discharge: -Changed None ID  Medications Discontinued at Hospital Discharge: -Stopped None ID  Medications that remain the same after Hospital Discharge:??  -All other medications will remain the same.    Medication Reconciliation was completed by comparing discharge summary, patient's EMR and Pharmacy list, and upon discussion with patient.  Admitted to the Urgent Care on 06/22/2021 due to Shoulder Pain. Discharge date was 06/22/2021. Discharged from [facility].    New?Medications Started at [facility] Discharge:?? -Started Baclofen 10 mg 3 times daily MethylPREDNISolone 4 mg Take according to package insert  Medication Changes at Hospital Discharge: -Changed None ID  Medications Discontinued at Hospital Discharge: -Stopped  Cyclobenzaprine HCl 10 mg  Medications that remain the same after Hospital Discharge:??  -All other medications will remain the same.    Medication Reconciliation was completed by comparing discharge summary, patient's EMR and Pharmacy list, and upon discussion with patient.  Admitted to the Urgent Care on  06/16/2021 due to Shoulder Pain. Discharge date was 06/16/2021. Discharged from [facility] at [facility].    New?Medications Started at [facility] Discharge:?? -started Cyclobenzaprine HCl 10 mg 1/2 tablet by mouth every 8 hours prn and may take 1 whole tablet at night as needed due to shoulder pain  Medication Changes at Hospital Discharge: -Changed None ID  Medications Discontinued at Hospital Discharge: -Stopped None ID  Medications that remain the same after Hospital Discharge:??  -All other medications will remain the same.    Medications: Outpatient Encounter Medications as of 07/14/2021  Medication Sig   acetaminophen (TYLENOL) 650 MG CR tablet Take 650-1,300 mg by mouth every 8 (eight) hours as needed for pain.    amiodarone (PACERONE) 200 MG tablet TAKE 1 TABLET(200 MG) BY MOUTH DAILY   apixaban (ELIQUIS) 5 MG TABS tablet TAKE 1 TABLET(5 MG) BY MOUTH TWICE DAILY   baclofen (LIORESAL) 10 MG tablet Take 1 tablet (10 mg total) by mouth 3 (three) times daily. (Patient not taking: Reported on 07/01/2021)   busPIRone (BUSPAR) 5 MG tablet TAKE 1 TABLET(5 MG) BY MOUTH TWICE DAILY   cholecalciferol (VITAMIN D) 1000 UNITS tablet Take 2,000 Units by mouth daily.   Cranberry 500 MG TABS Take 2 tablets by mouth daily.   doxazosin (CARDURA) 1 MG tablet Take 1 tablet (1 mg total) by mouth every evening.   furosemide (LASIX) 20 MG tablet Take 1 tablet (20 mg total) by mouth See admin instructions. Take 1 tablet (20mg ) by mouth every morning - take 1 additional tablet (20mg ) after lunch if needed for swelling   levothyroxine (SYNTHROID) 137 MCG tablet Take 1 tablet (137 mcg total) by mouth daily before breakfast.   loratadine (CLARITIN) 10 MG tablet Take 10 mg by mouth daily.   methylPREDNISolone (MEDROL  DOSEPAK) 4 MG TBPK tablet Take according to the package insert.   metoprolol succinate (TOPROL-XL) 50 MG 24 hr tablet Take with or immediately following a meal. (Patient not taking: Reported on  07/01/2021)   Multiple Vitamin (MULITIVITAMIN WITH MINERALS) TABS Take 1 tablet by mouth daily.   potassium chloride (KLOR-CON) 10 MEQ tablet TAKE 1 TABLET BY MOUTH EVERY DAY AS DIRECTED. MAY TAKE AN EXTRA PILL AFTER LUNCH WITH FLUID PILL AS NEEDED FOR SWELLING   rosuvastatin (CRESTOR) 40 MG tablet TAKE 1 TABLET(40 MG) BY MOUTH DAILY   temazepam (RESTORIL) 15 MG capsule TAKE 1 CAPSULE(15 MG) BY MOUTH AT BEDTIME AS NEEDED FOR SLEEP   valsartan (DIOVAN) 160 MG tablet Take 1 tablet (160 mg total) by mouth daily. Take in the morning   No facility-administered encounter medications on file as of 07/14/2021.   Care Gaps: Zoster Vaccines Tetanus/TDAP COVID-19 Vaccine Booster 4 Influenza Vaccine (last completed 08/13/2020) BP Greater than 140/90 on 05/26/2021 it was listed as 154/62  Star Rating Drugs: Rosuvastatin 40 mg last filled on 06/30/2021 for a 90-Day supply with [facility] Valsartan 160 mg last filled on 05/21/2021 for a 90-Day supply with [facility]  Reviewed chart prior to disease state call. Spoke with patient regarding BP  Recent Office Vitals: BP Readings from Last 3 Encounters:  07/12/21 (!) 166/55  06/22/21 (!) 180/55  06/16/21 (!) 188/55   Pulse Readings from Last 3 Encounters:  07/12/21 (!) 51  06/22/21 (!) 52  06/16/21 75    Wt Readings from Last 3 Encounters:  07/12/21 119 lb (54 kg)  06/22/21 124 lb (56.2 kg)  06/16/21 121 lb 14.6 oz (55.3 kg)     Kidney Function Lab Results  Component Value Date/Time   CREATININE 0.89 12/01/2020 04:18 PM   CREATININE 0.94 11/24/2020 04:58 AM   CREATININE 0.83 11/21/2020 05:35 AM   CREATININE 1.14 (H) 09/13/2020 12:00 AM   GFRNONAA 62 12/01/2020 04:18 PM   GFRAA 72 12/01/2020 04:18 PM    BMP Latest Ref Rng & Units 12/01/2020 11/24/2020 11/21/2020  Glucose 65 - 99 mg/dL 95 104(H) 169(H)  BUN 7 - 25 mg/dL 17 21 21   Creatinine 0.60 - 0.93 mg/dL 0.89 0.94 0.83  BUN/Creat Ratio 6 - 22 (calc) NOT APPLICABLE - -  Sodium  135 - 146 mmol/L 140 137 142  Potassium 3.5 - 5.3 mmol/L 4.6 3.5 3.3(L)  Chloride 98 - 110 mmol/L 103 100 107  CO2 20 - 32 mmol/L 26 28 25   Calcium 8.6 - 10.4 mg/dL 9.0 8.4(L) 8.7(L)    Current antihypertensive regimen:  Metoprolol Succinate 50 mg 1 tablet daily Valsartan 160 mg 1 tablet daily Doxazosin 1 mg nightly  How often are you checking your Blood Pressure? 3-5x per week  Current home BP readings: 140/60 this morning prior to her taking her medications. She reports that it has been elevated the last few visits she has had with the urgent care but she contributes it to all the pain she was in. She reports that she is now feeling better and she is going to make an appointment with Pain management. She did go to the urgent care and they gave her some muscle relaxer's and Prednisone which she states did not change her pain level much. She is taking OTC Tylenol and just with time her pain has decreased. She also reports have cataract surgery last week and she stated her blood pressure was elevated that day but she contributes that to nerves. She stated  her surgery went well and she is seeing better than she has seen in a very long time.   What recent interventions/DTPs have been made by any provider to improve Blood Pressure control since last CPP Visit: None ID  Any recent hospitalizations or ED visits since last visit with CPP? Yes  What diet changes have been made to improve Blood Pressure Control?  Patient stated she is still watching her sodium and salt intake but no particular diet. She has a really good appetite and her weight is at 119 and pretty stable there.  What exercise is being done to improve your Blood Pressure Control?  Patient has been having some shoulder pain and neck pain so she has been a little limited but she walks when she can   Adherence Review: Is the patient currently on ACE/ARB medication? Yes Does the patient have >5 day gap between last estimated fill  dates? No   07/14/2021 Name: [name] MRN: [id number] DOB: [birth year] [name] is a 80 y.o. year old female who is a primary care patient of Steele Sizer, MD.  Comprehensive medication review performed; Spoke to patient regarding cholesterol  Lipid Panel    Component Value Date/Time   CHOL 134 09/13/2020 0000   CHOL 164 06/06/2016 0907   TRIG 54 10/30/2020 0403   HDL 56 09/13/2020 0000   HDL 54 06/06/2016 0907   LDLCALC 53 09/13/2020 0000    10-year ASCVD risk score: The 10-year ASCVD risk score (Arnett DK, et al., 2019) is: 43.4%   Values used to calculate the score:     Age: 80 years     Sex: Female     Is Non-Hispanic African American: No     Diabetic: No     Tobacco smoker: No     Systolic Blood Pressure: 631 mmHg     Is BP treated: Yes     HDL Cholesterol: 56 mg/dL     Total Cholesterol: 134 mg/dL  Current antihyperlipidemic regimen:  Rosuvastatin 40 mg  Previous antihyperlipidemic medications tried: Zetia 10 mg, Atorvastatin 20 mg   ASCVD risk enhancing conditions: age >29, HTN, CKD, and CHF  What recent interventions/DTPs have been made by any provider to improve Cholesterol control since last CPP Visit: None ID  Any recent hospitalizations or ED visits since last visit with CPP? Yes   Adherence Review: Does the patient have >5 day gap between last estimated fill dates? No   Patient has scheduled appointment with Junius Argyle, CPP 08/24/2021 @1000   Lynann Bologna, CPA/CMA Clinical Pharmacist Assistant Phone: [phone]

## 2021-07-15 ENCOUNTER — Other Ambulatory Visit: Payer: Self-pay | Admitting: Family Medicine

## 2021-07-15 DIAGNOSIS — F409 Phobic anxiety disorder, unspecified: Secondary | ICD-10-CM

## 2021-07-23 ENCOUNTER — Emergency Department: Payer: Medicare Other

## 2021-07-23 ENCOUNTER — Inpatient Hospital Stay: Payer: Medicare Other

## 2021-07-23 ENCOUNTER — Inpatient Hospital Stay: Admit: 2021-07-23 | Payer: Medicare Other

## 2021-07-23 ENCOUNTER — Encounter: Payer: Self-pay | Admitting: Emergency Medicine

## 2021-07-23 ENCOUNTER — Other Ambulatory Visit: Payer: Self-pay

## 2021-07-23 ENCOUNTER — Inpatient Hospital Stay
Admission: EM | Admit: 2021-07-23 | Discharge: 2021-07-25 | DRG: 312 | Disposition: A | Discharge status: Home / Self Care | Payer: Medicare Other | Attending: Internal Medicine | Admitting: Internal Medicine

## 2021-07-23 DIAGNOSIS — Z20822 Contact with and (suspected) exposure to covid-19: Secondary | ICD-10-CM | POA: Diagnosis not present

## 2021-07-23 DIAGNOSIS — I739 Peripheral vascular disease, unspecified: Secondary | ICD-10-CM | POA: Diagnosis present

## 2021-07-23 DIAGNOSIS — R1110 Vomiting, unspecified: Secondary | ICD-10-CM | POA: Diagnosis present

## 2021-07-23 DIAGNOSIS — I440 Atrioventricular block, first degree: Secondary | ICD-10-CM | POA: Diagnosis not present

## 2021-07-23 DIAGNOSIS — Z961 Presence of intraocular lens: Secondary | ICD-10-CM | POA: Diagnosis present

## 2021-07-23 DIAGNOSIS — Z79890 Hormone replacement therapy (postmenopausal): Secondary | ICD-10-CM | POA: Diagnosis not present

## 2021-07-23 DIAGNOSIS — L539 Erythematous condition, unspecified: Secondary | ICD-10-CM | POA: Diagnosis present

## 2021-07-23 DIAGNOSIS — E039 Hypothyroidism, unspecified: Secondary | ICD-10-CM | POA: Diagnosis not present

## 2021-07-23 DIAGNOSIS — R55 Syncope and collapse: Secondary | ICD-10-CM | POA: Diagnosis not present

## 2021-07-23 DIAGNOSIS — Z743 Need for continuous supervision: Secondary | ICD-10-CM | POA: Diagnosis not present

## 2021-07-23 DIAGNOSIS — I4819 Other persistent atrial fibrillation: Secondary | ICD-10-CM | POA: Diagnosis not present

## 2021-07-23 DIAGNOSIS — N183 Chronic kidney disease, stage 3 unspecified: Secondary | ICD-10-CM | POA: Diagnosis not present

## 2021-07-23 DIAGNOSIS — G4700 Insomnia, unspecified: Secondary | ICD-10-CM | POA: Diagnosis not present

## 2021-07-23 DIAGNOSIS — I951 Orthostatic hypotension: Principal | ICD-10-CM

## 2021-07-23 DIAGNOSIS — I5042 Chronic combined systolic (congestive) and diastolic (congestive) heart failure: Secondary | ICD-10-CM | POA: Diagnosis present

## 2021-07-23 DIAGNOSIS — I130 Hypertensive heart and chronic kidney disease with heart failure and stage 1 through stage 4 chronic kidney disease, or unspecified chronic kidney disease: Secondary | ICD-10-CM | POA: Diagnosis present

## 2021-07-23 DIAGNOSIS — F419 Anxiety disorder, unspecified: Secondary | ICD-10-CM | POA: Diagnosis present

## 2021-07-23 DIAGNOSIS — M47812 Spondylosis without myelopathy or radiculopathy, cervical region: Secondary | ICD-10-CM | POA: Diagnosis present

## 2021-07-23 DIAGNOSIS — I083 Combined rheumatic disorders of mitral, aortic and tricuspid valves: Secondary | ICD-10-CM | POA: Diagnosis not present

## 2021-07-23 DIAGNOSIS — R404 Transient alteration of awareness: Secondary | ICD-10-CM | POA: Diagnosis not present

## 2021-07-23 DIAGNOSIS — Z882 Allergy status to sulfonamides status: Secondary | ICD-10-CM

## 2021-07-23 DIAGNOSIS — Z8051 Family history of malignant neoplasm of kidney: Secondary | ICD-10-CM

## 2021-07-23 DIAGNOSIS — Z9842 Cataract extraction status, left eye: Secondary | ICD-10-CM | POA: Diagnosis not present

## 2021-07-23 DIAGNOSIS — I10 Essential (primary) hypertension: Secondary | ICD-10-CM | POA: Diagnosis not present

## 2021-07-23 DIAGNOSIS — E785 Hyperlipidemia, unspecified: Secondary | ICD-10-CM | POA: Diagnosis not present

## 2021-07-23 DIAGNOSIS — Z8249 Family history of ischemic heart disease and other diseases of the circulatory system: Secondary | ICD-10-CM

## 2021-07-23 DIAGNOSIS — R001 Bradycardia, unspecified: Secondary | ICD-10-CM | POA: Diagnosis not present

## 2021-07-23 DIAGNOSIS — I499 Cardiac arrhythmia, unspecified: Secondary | ICD-10-CM | POA: Diagnosis not present

## 2021-07-23 DIAGNOSIS — Z79899 Other long term (current) drug therapy: Secondary | ICD-10-CM | POA: Diagnosis not present

## 2021-07-23 DIAGNOSIS — M47816 Spondylosis without myelopathy or radiculopathy, lumbar region: Secondary | ICD-10-CM | POA: Diagnosis not present

## 2021-07-23 DIAGNOSIS — I447 Left bundle-branch block, unspecified: Secondary | ICD-10-CM | POA: Diagnosis not present

## 2021-07-23 DIAGNOSIS — Z7901 Long term (current) use of anticoagulants: Secondary | ICD-10-CM

## 2021-07-23 DIAGNOSIS — I6523 Occlusion and stenosis of bilateral carotid arteries: Secondary | ICD-10-CM | POA: Diagnosis not present

## 2021-07-23 DIAGNOSIS — R6889 Other general symptoms and signs: Secondary | ICD-10-CM | POA: Diagnosis not present

## 2021-07-23 DIAGNOSIS — I5032 Chronic diastolic (congestive) heart failure: Secondary | ICD-10-CM | POA: Diagnosis present

## 2021-07-23 DIAGNOSIS — R231 Pallor: Secondary | ICD-10-CM | POA: Diagnosis not present

## 2021-07-23 DIAGNOSIS — Z880 Allergy status to penicillin: Secondary | ICD-10-CM

## 2021-07-23 DIAGNOSIS — Z885 Allergy status to narcotic agent status: Secondary | ICD-10-CM

## 2021-07-23 DIAGNOSIS — R112 Nausea with vomiting, unspecified: Secondary | ICD-10-CM | POA: Diagnosis not present

## 2021-07-23 DIAGNOSIS — Z91018 Allergy to other foods: Secondary | ICD-10-CM

## 2021-07-23 DIAGNOSIS — I517 Cardiomegaly: Secondary | ICD-10-CM | POA: Diagnosis not present

## 2021-07-23 DIAGNOSIS — Z881 Allergy status to other antibiotic agents status: Secondary | ICD-10-CM

## 2021-07-23 DIAGNOSIS — I480 Paroxysmal atrial fibrillation: Secondary | ICD-10-CM | POA: Diagnosis present

## 2021-07-23 LAB — CBC WITH DIFFERENTIAL/PLATELET
Abs Immature Granulocytes: 0.03 10*3/uL (ref 0.00–0.07)
Basophils Absolute: 0 10*3/uL (ref 0.0–0.1)
Basophils Relative: 1 %
Eosinophils Absolute: 0 10*3/uL (ref 0.0–0.5)
Eosinophils Relative: 1 %
HCT: 34.9 % — ABNORMAL LOW (ref 36.0–46.0)
Hemoglobin: 11.9 g/dL — ABNORMAL LOW (ref 12.0–15.0)
Immature Granulocytes: 1 %
Lymphocytes Relative: 12 %
Lymphs Abs: 0.7 10*3/uL (ref 0.7–4.0)
MCH: 31.7 pg (ref 26.0–34.0)
MCHC: 34.1 g/dL (ref 30.0–36.0)
MCV: 93.1 fL (ref 80.0–100.0)
Monocytes Absolute: 0.6 10*3/uL (ref 0.1–1.0)
Monocytes Relative: 10 %
Neutro Abs: 4.5 10*3/uL (ref 1.7–7.7)
Neutrophils Relative %: 75 %
Platelets: 153 10*3/uL (ref 150–400)
RBC: 3.75 MIL/uL — ABNORMAL LOW (ref 3.87–5.11)
RDW: 16 % — ABNORMAL HIGH (ref 11.5–15.5)
WBC: 5.9 10*3/uL (ref 4.0–10.5)
nRBC: 0 % (ref 0.0–0.2)

## 2021-07-23 LAB — RESP PANEL BY RT-PCR (FLU A&B, COVID) ARPGX2
Influenza A by PCR: NEGATIVE
Influenza B by PCR: NEGATIVE
SARS Coronavirus 2 by RT PCR: NEGATIVE

## 2021-07-23 LAB — TSH: TSH: 1.583 u[IU]/mL (ref 0.350–4.500)

## 2021-07-23 LAB — TROPONIN I (HIGH SENSITIVITY)
Troponin I (High Sensitivity): 7 ng/L (ref <18)
Troponin I (High Sensitivity): 9 ng/L (ref <18)

## 2021-07-23 LAB — BASIC METABOLIC PANEL
Anion gap: 8 (ref 5–15)
BUN: 13 mg/dL (ref 8–23)
CO2: 25 mmol/L (ref 22–32)
Calcium: 8.5 mg/dL — ABNORMAL LOW (ref 8.9–10.3)
Chloride: 105 mmol/L (ref 98–111)
Creatinine, Ser: 0.77 mg/dL (ref 0.44–1.00)
GFR, Estimated: >60 mL/min (ref >60)
Glucose, Bld: 156 mg/dL — ABNORMAL HIGH (ref 70–99)
Potassium: 3.7 mmol/L (ref 3.5–5.1)
Sodium: 138 mmol/L (ref 135–145)

## 2021-07-23 LAB — GLUCOSE, CAPILLARY: Glucose-Capillary: 137 mg/dL — ABNORMAL HIGH (ref 70–99)

## 2021-07-23 LAB — BRAIN NATRIURETIC PEPTIDE: B Natriuretic Peptide: 145.5 pg/mL — ABNORMAL HIGH (ref 0.0–100.0)

## 2021-07-23 LAB — MRSA NEXT GEN BY PCR, NASAL: MRSA by PCR Next Gen: NOT DETECTED

## 2021-07-23 MED ORDER — ACETAMINOPHEN 650 MG RE SUPP: 650.0000 mg | Freq: Four times a day (QID) | RECTAL | Status: DC | PRN

## 2021-07-23 MED ORDER — CHLORHEXIDINE GLUCONATE CLOTH 2 % EX PADS
6.0000 | MEDICATED_PAD | Freq: Every day | CUTANEOUS | Status: DC
  Administered 2021-07-24: 6 via TOPICAL

## 2021-07-23 MED ORDER — ONDANSETRON HCL 4 MG/2ML IJ SOLN
4.0000 mg | Freq: Four times a day (QID) | INTRAMUSCULAR | Status: DC | PRN
  Administered 2021-07-24: 4 mg via INTRAVENOUS
  Filled 2021-07-23: qty 2

## 2021-07-23 MED ORDER — OXYCODONE HCL 5 MG PO TABS
5.0000 mg | ORAL_TABLET | ORAL | Status: DC | PRN
  Administered 2021-07-23 – 2021-07-24 (×4): 5 mg via ORAL
  Filled 2021-07-23 (×4): qty 1

## 2021-07-23 MED ORDER — AMLODIPINE BESYLATE 5 MG PO TABS
2.5000 mg | ORAL_TABLET | Freq: Every day | ORAL | Status: DC
  Administered 2021-07-24 – 2021-07-25 (×2): 2.5 mg via ORAL
  Filled 2021-07-23 (×2): qty 1

## 2021-07-23 MED ORDER — ONDANSETRON HCL 4 MG PO TABS: 4.0000 mg | ORAL_TABLET | Freq: Four times a day (QID) | ORAL | Status: DC | PRN

## 2021-07-23 MED ORDER — APIXABAN 5 MG PO TABS
5.0000 mg | ORAL_TABLET | Freq: Two times a day (BID) | ORAL | Status: DC
  Administered 2021-07-23 – 2021-07-25 (×4): 5 mg via ORAL
  Filled 2021-07-23 (×4): qty 1

## 2021-07-23 MED ORDER — LORATADINE 10 MG PO TABS
10.0000 mg | ORAL_TABLET | Freq: Every day | ORAL | Status: DC
  Administered 2021-07-24 – 2021-07-25 (×2): 10 mg via ORAL
  Filled 2021-07-23 (×2): qty 1

## 2021-07-23 MED ORDER — LEVOTHYROXINE SODIUM 137 MCG PO TABS
137.0000 ug | ORAL_TABLET | Freq: Every day | ORAL | Status: DC
  Administered 2021-07-24 – 2021-07-25 (×2): 137 ug via ORAL
  Filled 2021-07-23 (×2): qty 1

## 2021-07-23 MED ORDER — VALACYCLOVIR HCL 500 MG PO TABS
1000.0000 mg | ORAL_TABLET | Freq: Three times a day (TID) | ORAL | Status: DC
  Administered 2021-07-23 – 2021-07-25 (×6): 1000 mg via ORAL
  Filled 2021-07-23 (×8): qty 2

## 2021-07-23 MED ORDER — ACETAMINOPHEN 325 MG PO TABS: 650.0000 mg | ORAL_TABLET | Freq: Four times a day (QID) | ORAL | Status: DC | PRN

## 2021-07-23 MED ORDER — POLYETHYLENE GLYCOL 3350 17 G PO PACK: 17.0000 g | PACK | Freq: Every day | ORAL | Status: DC | PRN

## 2021-07-23 MED ORDER — BUSPIRONE HCL 5 MG PO TABS
5.0000 mg | ORAL_TABLET | Freq: Two times a day (BID) | ORAL | Status: DC
  Administered 2021-07-23 – 2021-07-25 (×4): 5 mg via ORAL
  Filled 2021-07-23 (×5): qty 1

## 2021-07-23 MED ORDER — MORPHINE SULFATE (PF) 2 MG/ML IV SOLN
2.0000 mg | INTRAVENOUS | Status: DC | PRN
Start: 2021-07-23 — End: 2021-07-25
  Administered 2021-07-23 – 2021-07-24 (×2): 2 mg via INTRAVENOUS
  Filled 2021-07-23 (×2): qty 1

## 2021-07-23 MED ORDER — AMIODARONE HCL 200 MG PO TABS: 200.0000 mg | ORAL_TABLET | Freq: Every day | ORAL | Status: DC

## 2021-07-23 MED ORDER — TEMAZEPAM 7.5 MG PO CAPS: 15.0000 mg | ORAL_CAPSULE | Freq: Every evening | ORAL | Status: DC | PRN

## 2021-07-23 MED ORDER — HYDRALAZINE HCL 50 MG PO TABS
25.0000 mg | ORAL_TABLET | Freq: Every day | ORAL | Status: DC
  Administered 2021-07-23 – 2021-07-24 (×2): 25 mg via ORAL
  Filled 2021-07-23 (×2): qty 1

## 2021-07-23 MED ORDER — ROSUVASTATIN CALCIUM 10 MG PO TABS
40.0000 mg | ORAL_TABLET | Freq: Every day | ORAL | Status: DC
  Administered 2021-07-24 – 2021-07-25 (×2): 40 mg via ORAL
  Filled 2021-07-23 (×2): qty 4

## 2021-07-23 MED ORDER — GABAPENTIN 100 MG PO CAPS
100.0000 mg | ORAL_CAPSULE | Freq: Two times a day (BID) | ORAL | Status: DC
  Administered 2021-07-23 – 2021-07-24 (×2): 100 mg via ORAL
  Filled 2021-07-23 (×2): qty 1

## 2021-07-23 NOTE — ED Triage Notes (Signed)
Pt to ER via EMS from home with c/o near syncope this AM.  Pt was found by SO slumped over in a chair and minimally responsive.  Pt was found to have HR in 30s and BP 81/27.  Pt arrives A&O x 4,  HR remains in 30s.

## 2021-07-23 NOTE — ED Provider Notes (Signed)
[facility] Emergency Department Provider Note ____________________________________________   Event Date/Time   First MD Initiated Contact with Patient 07/23/21 [phone]     (approximate)  I have reviewed the triage vital signs and the nursing notes.   HISTORY  Chief Complaint Near Syncope and Bradycardia    HPI GLYNNIS GAVEL is a 80 y.o. female with PMH as noted below including atrial fibrillation, hypertension, and CKD who presents with syncope, acute onset this morning when she was found by her boyfriend.  EMS reported that the patient was going in and out of consciousness when they arrived.  Currently, the patient is alert.  She states that she still feels somewhat lightheaded and weak.  She reports back and right arm pain which is chronic, but has no chest pain.  She has mild shortness of breath.  She states that similar episodes of feeling dizzy or passing out have happened before.  Past Medical History:  Diagnosis Date   Anxiety    Bronchitis    Chronic combined systolic (congestive) and diastolic (congestive) heart failure ([place])    a. 02/2018 Echo: EF 45-50%, diff HK, triv AI, mod MR, mildly dil LA/RA, nl RV fxn, mod TR. PASP 40-40mmHg.   Chronic kidney disease    DJD (degenerative joint disease), cervical    History of stress test    a. 01/2008 MV: EF 76%. Fair ex tol. No ischemia.   Hyperlipidemia    Hypertension    Hypothyroid    LBBB (left bundle branch block)    Leg pain    a. 01/2018 ABI's wnl.   Lumbar spondylosis    Persistent atrial fibrillation ([place])    a. 01/2018 Event monitor: 45 runs of SVT, longest 14.5 sec. SVT felt to be Afib/flutter-->2% burden. Longest run of AF 4h 64m (CHA2DS2VASc = 5-->Xarelto); b. 03/2019 s/p DCCV; c. 04/2019 Repeat Amio load and DCCV.   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   Pulmonary hypertension, unspecified ([place]) 04/05/2018   Paroxysmal sinus tachycardia ([place]) 01/29/2018   Leg pain 01/04/2018   PAD (peripheral artery disease) ([place]) 01/04/2018   Carotid artery calcification, bilateral 12/19/2017   Cervical radiculitis 12/19/2017   Hyperglycemia 03/06/2016   Hypothyroid 04/21/2015   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   Anxiety 04/21/2015   Hyperlipidemia 04/21/2015   Diuretic-induced hypokalemia 04/21/2015   Palpitations 12/18/2012   Hypertension 05/27/2012    Past Surgical History:  Procedure Laterality Date   BACK SURGERY  07/2019   CARDIOVERSION N/A 03/26/2018   Procedure: CARDIOVERSION;  Surgeon: Nelva Bush, MD;  Location: [facility] ORS;  Service: Cardiovascular;  Laterality: N/A;   CARDIOVERSION N/A 04/24/2018   Procedure: CARDIOVERSION;  Surgeon: Minna Merritts, MD;  Location: [facility] ORS;  Service: Cardiovascular;  Laterality: N/A;   CATARACT EXTRACTION W/PHACO Right 07/12/2021   Procedure: CATARACT EXTRACTION PHACO AND INTRAOCULAR LENS PLACEMENT (IOC) RIGHT 16.92 01:30.8 ;  Surgeon: Birder Robson, MD;  Location: [place];  Service: Ophthalmology;  Laterality: Right;   ELECTROPHYSIOLOGY STUDY N/A 02/27/2012   Procedure: ELECTROPHYSIOLOGY STUDY;  Surgeon: Evans Lance, MD;  Location: [facility] CATH LAB;  Service: Cardiovascular;  Laterality: N/A;   EPS and ablation for SVT  5/13   slow pathway ablation by Dr Lovena Le   EYE SURGERY     surgery for detatched retina   NECK SURGERY  02/2020   SUPRAVENTRICULAR TACHYCARDIA  ABLATION N/A 02/27/2012   Procedure: SUPRAVENTRICULAR TACHYCARDIA ABLATION;  Surgeon: Evans Lance, MD;  Location: [facility] CATH LAB;  Service: Cardiovascular;  Laterality: N/A;    Prior to Admission medications   Medication Sig Start Date End Date Taking? Authorizing Provider  acetaminophen (TYLENOL) 650 MG CR tablet Take 650-1,300 mg by mouth every 8 (eight) hours as needed for pain.     [provider]  amiodarone (PACERONE) 200 MG tablet TAKE 1 TABLET(200 MG) BY MOUTH DAILY 01/03/21   Minna Merritts, MD  apixaban (ELIQUIS) 5 MG TABS tablet TAKE 1 TABLET(5 MG) BY MOUTH TWICE DAILY 02/16/21   Minna Merritts, MD  baclofen (LIORESAL) 10 MG tablet Take 1 tablet (10 mg total) by mouth 3 (three) times daily. Patient not taking: Reported on 07/01/2021 06/22/21   Margarette Canada, NP  busPIRone (BUSPAR) 5 MG tablet TAKE 1 TABLET(5 MG) BY MOUTH TWICE DAILY 07/06/21   Steele Sizer, MD  cholecalciferol (VITAMIN D) 1000 UNITS tablet Take 2,000 Units by mouth daily.    [provider]  Cranberry 500 MG TABS Take 2 tablets by mouth daily.    [provider]  doxazosin (CARDURA) 1 MG tablet Take 1 tablet (1 mg total) by mouth every evening. 11/05/20   Minna Merritts, MD  furosemide (LASIX) 20 MG tablet Take 1 tablet (20 mg total) by mouth See admin instructions. Take 1 tablet (20mg ) by mouth every morning - take 1 additional tablet (20mg ) after lunch if needed for swelling 11/05/20   Minna Merritts, MD  levothyroxine (SYNTHROID) 137 MCG tablet Take 1 tablet (137 mcg total) by mouth daily before breakfast. 03/14/21   Steele Sizer, MD  loratadine (CLARITIN) 10 MG tablet Take 10 mg by mouth daily.    [provider]  methylPREDNISolone (MEDROL DOSEPAK) 4 MG TBPK tablet Take according to the package insert. 06/22/21   Margarette Canada, NP  metoprolol succinate (TOPROL-XL) 50 MG 24 hr tablet Take with or immediately following a meal. Patient not taking: Reported on 07/01/2021 11/05/20    Minna Merritts, MD  Multiple Vitamin (MULITIVITAMIN WITH MINERALS) TABS Take 1 tablet by mouth daily.    [provider]  potassium chloride (KLOR-CON) 10 MEQ tablet TAKE 1 TABLET BY MOUTH EVERY DAY AS DIRECTED. MAY TAKE AN EXTRA PILL AFTER LUNCH WITH FLUID PILL AS NEEDED FOR SWELLING 11/05/20   Gollan, Kathlene November, MD  rosuvastatin (CRESTOR) 40 MG tablet TAKE 1 TABLET(40 MG) BY MOUTH DAILY 01/03/21   Ancil Boozer, Drue Stager, MD  temazepam (RESTORIL) 15 MG capsule TAKE 1 CAPSULE(15 MG) BY MOUTH AT BEDTIME AS NEEDED FOR SLEEP 07/15/21   Ancil Boozer, Drue Stager, MD  valsartan (DIOVAN) 160 MG tablet Take 1 tablet (160 mg total) by mouth daily. Take in the morning 11/05/20   Minna Merritts, MD    Allergies Chocolate, Penicillins, Codeine, Erythromycin, and Septra [sulfamethoxazole-trimethoprim]  Family History  Problem Relation Age of Onset   Alzheimer's disease Mother    Stroke Father    Breast cancer Father    Heart disease Father  Pacemaker   Breast cancer Paternal Aunt    Kidney cancer Maternal Grandmother    Alzheimer's disease Maternal Grandfather    Thyroid disease Paternal Grandmother    Stroke Paternal Grandfather     Social History Social History   Tobacco Use   Smoking status: Never   Smokeless tobacco: Never   Tobacco comments:    smoking cessation materials not required  Vaping Use   Vaping Use: Never used  Substance Use Topics   Alcohol use: No   Drug use: No    Review of Systems  Constitutional: No fever/chills Eyes: No visual changes. ENT: No sore throat. Cardiovascular: Denies chest pain. Respiratory: Positive for shortness of breath. Gastrointestinal: No vomiting or diarrhea.  Genitourinary: Negative for dysuria.  Musculoskeletal: Positive for back pain. Skin: Negative for rash. Neurological: Negative for headache.   ____________________________________________   PHYSICAL EXAM:  VITAL SIGNS: ED Triage Vitals  Enc Vitals Group     BP       Pulse      Resp      Temp      Temp src      SpO2      Weight      Height      Head Circumference      Peak Flow      Pain Score      Pain Loc      Pain Edu?      Excl. in [place]?     Constitutional: Alert and oriented. Well appearing and in no acute distress. Eyes: Conjunctivae are normal.  Head: Atraumatic. Nose: No congestion/rhinnorhea. Mouth/Throat: Mucous membranes are moist.   Neck: Normal range of motion.  Cardiovascular: Bradycardic, irregular rhythm. Grossly normal heart sounds.  Good peripheral circulation. Respiratory: Normal respiratory effort.  No retractions. Lungs CTAB. Gastrointestinal: Soft and nontender. No distention.  Genitourinary: No flank tenderness. Musculoskeletal: No lower extremity edema.  Extremities warm and well perfused.  Neurologic:  Normal speech and language. No gross focal neurologic deficits are appreciated.  Skin:  Skin is warm and dry. No rash noted. Psychiatric: Mood and affect are normal. Speech and behavior are normal.  ____________________________________________   LABS (all labs ordered are listed, but only abnormal results are displayed)  Labs Reviewed  BASIC METABOLIC PANEL - Abnormal; Notable for the following components:      Result Value   Glucose, Bld 156 (*)    Calcium 8.5 (*)    All other components within normal limits  CBC WITH DIFFERENTIAL/PLATELET - Abnormal; Notable for the following components:   RBC 3.75 (*)    Hemoglobin 11.9 (*)    HCT 34.9 (*)    RDW 16.0 (*)    All other components within normal limits  BRAIN NATRIURETIC PEPTIDE - Abnormal; Notable for the following components:   B Natriuretic Peptide 145.5 (*)    All other components within normal limits  RESP PANEL BY RT-PCR (FLU A&B, COVID) ARPGX2  URINALYSIS, COMPLETE (UACMP) WITH MICROSCOPIC  TROPONIN I (HIGH SENSITIVITY)  TROPONIN I (HIGH SENSITIVITY)   ____________________________________________  EKG  ED ECG REPORT I, Arta Silence, the  attending physician, personally viewed and interpreted this ECG.  Date: 07/23/2021 EKG Time: 0857 Rate: 37 Rhythm: Atrial fibrillation with slow ventricular response QRS Axis: normal Intervals: LBBB ST/T Wave abnormalities: normal Narrative Interpretation: no evidence of acute ischemia  ____________________________________________  RADIOLOGY  Chest x-ray interpreted by me shows no focal consolidation or edema ____________________________________________   PROCEDURES  Procedure(s) performed: No  Procedures  Critical Care performed: Yes  CRITICAL CARE Performed by: Arta Silence   Total critical care time: 30 minutes  Critical care time was exclusive of separately billable procedures and treating other patients.  Critical care was necessary to treat or prevent imminent or life-threatening deterioration.  Critical care was time spent personally by me on the following activities: development of treatment plan with patient and/or surrogate as well as nursing, discussions with consultants, evaluation of patient's response to treatment, examination of patient, obtaining history from patient or surrogate, ordering and performing treatments and interventions, ordering and review of laboratory studies, ordering and review of radiographic studies, pulse oximetry and re-evaluation of patient's condition.  ____________________________________________   INITIAL IMPRESSION / ASSESSMENT AND PLAN / ED COURSE  Pertinent labs & imaging results that were available during my care of the patient were reviewed by me and considered in my medical decision making (see chart for details).   80 year old female with PMH as noted above including atrial fibrillation, hypertension, and CKD presents with syncope and bradycardia.  I reviewed the past medical records in [place].  The patient was seen in urgent care twice last month for right shoulder and back pain, and was most recently admitted in  February with aspiration pneumonia and sepsis.  On exam currently, the patient's heart rate is in the high 30s to low 40s but her blood pressure is normal.  She is alert and oriented and overall well-appearing.  The physical exam is otherwise unremarkable.  Overall I suspect that the syncope is likely related to her bradycardia; etiology of the bradycardia includes atrial fibrillation, sick sinus syndrome or other primary cardiac cause, hyperkalemia or other electrolyte disturbance, or less likely ACS.  At this time the patient does not require emergent pacing given her normal mental status and blood pressure.  We will obtain lab work-up, consult cardiology, and plan for admission.  ----------------------------------------- 11:15 AM on 07/23/2021 -----------------------------------------  I consulted Dr. Caryl Comes from cardiology who advises that given that the patient remains bradycardic but is no longer hypotensive or near syncopal, he doubts bradycardia as the specific etiology of the syncopal episode.  He agrees with admission for monitoring.  The patient remains hemodynamically stable.  I consulted Dr. Joycelyn Schmid from the hospitalist service for admission.  ____________________________________________   FINAL CLINICAL IMPRESSION(S) / ED DIAGNOSES  Final diagnoses:  Syncope, unspecified syncope type  Bradycardia      NEW MEDICATIONS STARTED DURING THIS VISIT:  New Prescriptions   No medications on file     Note:  This document was prepared using Dragon voice recognition software and may include unintentional dictation errors.    Arta Silence, MD 07/23/21 1116

## 2021-07-23 NOTE — ED Notes (Signed)
Pt taken for U/S

## 2021-07-23 NOTE — Consult Note (Signed)
Cardiology Consultation:   Patient ID: [name] MRN: [id number]; DOB: 23-May-1941  Admit date: 07/23/2021 Date of Consult: 07/23/2021  PCP:  Steele Sizer, MD   [facility] Providers Cardiologist: TG    Patient Profile:   [name] is a 80 y.o. female with a hx of persistent atrial fibrillation now managed with amiodarone, hypertension with issues of orthostatic hypotension prompting down titration of her medications who is being seen 07/23/2021 for the evaluation of near syncope at the request of Dr  Rowe Pavy.  History of Present Illness:   [name] admitted with syncope.  Indeed probably not syncope but near syncope as she had no loss of body tone.  She is recently been suffering from severe pain related to what is thought to be zoster.  She awakened this morning and was in bed again with severe pain.  She rose to go to the bathroom and had a similar sensation as described below occurring with standing.  She sat on the stool by her bed.  She was able to communicate with her boyfriend on a number of occasions, but for these she has no recollection.  She did not fall off the stool.  He notes no change in her color.  EMS was called and  EMS notes were not reviewed but per the ER, "EMS reported that the patient was going in and out of consciousness when they arrived."  I was told upon their arrival, per the ER MD, and she was bradycardic and hypotensive and that following arrival in the emergency room with fluid resuscitation she remained bradycardic but had normal blood pressure In the emergency room she has had blood pressures ranging from 100--200 with heart rates progressively moving from the 40s--60s.  Prior history of orthostatic lightheadedness occurring primarily when arising from bed to go to the bathroom at night History of persistent atrial fibrillation with prior cardioversion most recently 2019; managed with amiodarone and metoprolol.  Anticoagulation with apixaban  no clinical bleeding  Hypertension has been a problem but with symptomatic orthostasis, amlodipine has been discontinued and valsartan doses were decreased    Date Cr K Hgb  10/22 0.77 3.7 11.9         DATE TEST EF   5/19 Echo   45-50 % Diffuse hypokinesis MR  moderate and posteriorly directed  1/22 Echo   60-65 % PA press 55 MR mild-mod        Event Recorder personnally reviewed  4/19 A. fib 2% heart rate ranging 56--129 longest episode 4 hours Sinus rates were 48--100 with a mean of 66; ventricular ectopic burden was greater than 9%  Past Medical History:  Diagnosis Date   Anxiety    Bronchitis    Chronic combined systolic (congestive) and diastolic (congestive) heart failure ([place])    a. 02/2018 Echo: EF 45-50%, diff HK, triv AI, mod MR, mildly dil LA/RA, nl RV fxn, mod TR. PASP 40-37mmHg.   Chronic kidney disease    DJD (degenerative joint disease), cervical    History of stress test    a. 01/2008 MV: EF 76%. Fair ex tol. No ischemia.   Hyperlipidemia    Hypertension    Hypothyroid    LBBB (left bundle branch block)    Leg pain    a. 01/2018 ABI's wnl.   Lumbar spondylosis    Persistent atrial fibrillation ([place])    a. 01/2018 Event monitor: 45 runs of SVT, longest 14.5 sec. SVT felt to be Afib/flutter-->2% burden. Longest run  of AF 4h 87m (CHA2DS2VASc = 5-->Xarelto); b. 03/2019 s/p DCCV; c. 04/2019 Repeat Amio load and DCCV.   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    Past Surgical History:  Procedure Laterality Date   BACK SURGERY  07/2019   CARDIOVERSION N/A 03/26/2018   Procedure: CARDIOVERSION;  Surgeon: Nelva Bush, MD;  Location: [facility] ORS;  Service: Cardiovascular;  Laterality: N/A;   CARDIOVERSION N/A 04/24/2018   Procedure: CARDIOVERSION;  Surgeon: Minna Merritts, MD;  Location: [facility] ORS;  Service: Cardiovascular;  Laterality: N/A;   CATARACT EXTRACTION W/PHACO Right 07/12/2021   Procedure: CATARACT EXTRACTION  PHACO AND INTRAOCULAR LENS PLACEMENT (IOC) RIGHT 16.92 01:30.8 ;  Surgeon: Birder Robson, MD;  Location: [facility];  Service: Ophthalmology;  Laterality: Right;   ELECTROPHYSIOLOGY STUDY N/A 02/27/2012   Procedure: ELECTROPHYSIOLOGY STUDY;  Surgeon: Evans Lance, MD;  Location: [facility] CATH LAB;  Service: Cardiovascular;  Laterality: N/A;   EPS and ablation for SVT  5/13   slow pathway ablation by Dr Lovena Le   EYE SURGERY     surgery for detatched retina   NECK SURGERY  02/2020   SUPRAVENTRICULAR TACHYCARDIA ABLATION N/A 02/27/2012   Procedure: SUPRAVENTRICULAR TACHYCARDIA ABLATION;  Surgeon: Evans Lance, MD;  Location: [facility] LAB;  Service: Cardiovascular;  Laterality: N/A;     Home Medications:  Prior to Admission medications   Medication Sig Start Date End Date Taking? Authorizing Provider  acetaminophen (TYLENOL) 650 MG CR tablet Take 650-1,300 mg by mouth every 8 (eight) hours as needed for pain.     [provider]  amiodarone (PACERONE) 200 MG tablet TAKE 1 TABLET(200 MG) BY MOUTH DAILY 01/03/21   Minna Merritts, MD  apixaban (ELIQUIS) 5 MG TABS tablet TAKE 1 TABLET(5 MG) BY MOUTH TWICE DAILY 02/16/21   Minna Merritts, MD  baclofen (LIORESAL) 10 MG tablet Take 1 tablet (10 mg total) by mouth 3 (three) times daily. Patient not taking: Reported on 07/01/2021 06/22/21   Margarette Canada, NP  busPIRone (BUSPAR) 5 MG tablet TAKE 1 TABLET(5 MG) BY MOUTH TWICE DAILY 07/06/21   Steele Sizer, MD  cholecalciferol (VITAMIN D) 1000 UNITS tablet Take 2,000 Units by mouth daily.    [provider]  Cranberry 500 MG TABS Take 2 tablets by mouth daily.    [provider]  doxazosin (CARDURA) 1 MG tablet Take 1 tablet (1 mg total) by mouth every evening. 11/05/20   Minna Merritts, MD  furosemide (LASIX) 20 MG tablet Take 1 tablet (20 mg total) by mouth See admin instructions. Take 1 tablet (20mg ) by mouth every morning - take 1 additional tablet (20mg ) after lunch  if needed for swelling 11/05/20   Minna Merritts, MD  levothyroxine (SYNTHROID) 137 MCG tablet Take 1 tablet (137 mcg total) by mouth daily before breakfast. 03/14/21   Steele Sizer, MD  loratadine (CLARITIN) 10 MG tablet Take 10 mg by mouth daily.    [provider]  methylPREDNISolone (MEDROL DOSEPAK) 4 MG TBPK tablet Take according to the package insert. 06/22/21   Margarette Canada, NP  metoprolol succinate (TOPROL-XL) 50 MG 24 hr tablet Take with or immediately following a meal. Patient not taking: Reported on 07/01/2021 11/05/20   Minna Merritts, MD  Multiple Vitamin (MULITIVITAMIN WITH MINERALS) TABS Take 1 tablet by mouth daily.    [provider]  potassium chloride (KLOR-CON) 10 MEQ tablet TAKE 1 TABLET BY MOUTH EVERY  DAY AS DIRECTED. MAY TAKE AN EXTRA PILL AFTER LUNCH WITH FLUID PILL AS NEEDED FOR SWELLING 11/05/20   Minna Merritts, MD  rosuvastatin (CRESTOR) 40 MG tablet TAKE 1 TABLET(40 MG) BY MOUTH DAILY 01/03/21   Ancil Boozer, Drue Stager, MD  temazepam (RESTORIL) 15 MG capsule TAKE 1 CAPSULE(15 MG) BY MOUTH AT BEDTIME AS NEEDED FOR SLEEP 07/15/21   Ancil Boozer, Drue Stager, MD  valsartan (DIOVAN) 160 MG tablet Take 1 tablet (160 mg total) by mouth daily. Take in the morning 11/05/20   Minna Merritts, MD    Inpatient Medications: Scheduled Meds:  Continuous Infusions:  PRN Meds: acetaminophen **OR** acetaminophen, ondansetron **OR** ondansetron (ZOFRAN) IV, polyethylene glycol  Allergies:    Allergies  Allergen Reactions   Chocolate Swelling   Penicillins Swelling and Rash    Has patient had a PCN reaction causing immediate rash, facial/tongue/throat swelling, SOB or lightheadedness with hypotension: Yes Has patient had a PCN reaction causing severe rash involving mucus membranes or skin necrosis: No Has patient had a PCN reaction that required hospitalization: No Has patient had a PCN reaction occurring within the last 10 years: No If all of the above answers are "NO",  then may proceed with Cephalosporin use.    Codeine Nausea And Vomiting   Erythromycin Itching and Swelling   Septra [Sulfamethoxazole-Trimethoprim] Swelling    Social History:   Social History   Socioeconomic History   Marital status: Widowed    Spouse name: Not on file   Number of children: 1   Years of education: Not on file   Highest education level: 12th grade  Occupational History   Occupation: Retired  Tobacco Use   Smoking status: Never   Smokeless tobacco: Never   Tobacco comments:    smoking cessation materials not required  Vaping Use   Vaping Use: Never used  Substance and Sexual Activity   Alcohol use: No   Drug use: No   Sexual activity: Not Currently    Partners: Male    Birth control/protection: Post-menopausal  Other Topics Concern   Not on file  Social History Narrative   Lives in [place], she has a boyfriend   She has cats, some inside, some outside    Social Determinants of Health   Financial Resource Strain: Low Risk    Difficulty of Paying Living Expenses: Not hard at all  Food Insecurity: No Food Insecurity   Worried About Charity fundraiser in the Last Year: Never true   Arboriculturist in the Last Year: Never true  Transportation Needs: No Transportation Needs   Lack of Transportation (Medical): No   Lack of Transportation (Non-Medical): No  Physical Activity: Inactive   Days of Exercise per Week: 0 days   Minutes of Exercise per Session: 0 min  Stress: No Stress Concern Present   Feeling of Stress : Only a little  Social Connections: Moderately Integrated   Frequency of Communication with Friends and Family: More than three times a week   Frequency of Social Gatherings with Friends and Family: More than three times a week   Attends Religious Services: More than 4 times per year   Active Member of [employer] or Organizations: Yes   Attends Archivist Meetings: More than 4 times per year   Marital Status: Widowed  Arboriculturist Violence: Not At Risk   Fear of Current or Ex-Partner: No   Emotionally Abused: No   Physically Abused: No   Sexually Abused: No  Family History:    Family History  Problem Relation Age of Onset   Alzheimer's disease Mother    Stroke Father    Breast cancer Father    Heart disease Father        Pacemaker   Breast cancer Paternal Aunt    Kidney cancer Maternal Grandmother    Alzheimer's disease Maternal Grandfather    Thyroid disease Paternal Grandmother    Stroke Paternal Grandfather      ROS:  Please see the history of present illness.   All other ROS reviewed and negative.     Physical Exam/Data:   Vitals:   07/23/21 0858 07/23/21 0859 07/23/21 0904 07/23/21 1015  BP: (!) 123/47   (!) 157/49  Pulse: (!) 39   (!) 42  Resp: 15   16  Temp:   (!) 97 F (36.1 C)   SpO2: 99%   100%  Weight:  54 kg    Height:  5\' 6"  (1.676 m)     No intake or output data in the 24 hours ending 07/23/21 1436 Last 3 Weights 07/23/2021 07/12/2021 07/01/2021  Weight (lbs) 119 lb 119 lb 118 lb  Weight (kg) 53.978 kg 53.978 kg 53.524 kg     Body mass index is 19.21 kg/m.  General:  Well nourished, well developed, in no acute distress  HEENT: normal Neck: no JVD Vascular: No carotid bruits; Distal pulses 2+ bilaterally Cardiac:  normal S1, S2; RRR; no murmur   Lungs:  clear to auscultation bilaterally, no wheezing, rhonchi or rales  Abd: soft, nontender, no hepatomegaly  Ext: no edema Musculoskeletal:  No deformities, BUE and BLE strength normal and equal Skin: warm and dry  Neuro:  CNs 2-12 intact, no focal abnormalities noted Psych:  Normal affect   EKG:  The EKG was personally reviewed and demonstrates: Sinus bradycardia at about 35-40 with left bundle branch block PR interval of about 260-300 ms Telemetry now shows a PR interval that is normal ECG 1/22 sinus tachycardia with left bundle branch block with PR interval of 160 ms Telemetry:  Telemetry was personally reviewed  and demonstrates: Sinus rates ranging from the high 30s to the 60s     Laboratory Data:  High Sensitivity Troponin:   Recent Labs  Lab 07/23/21 0906 07/23/21 1230  TROPONINIHS 7 9     Chemistry Recent Labs  Lab 07/23/21 0906  NA 138  K 3.7  CL 105  CO2 25  GLUCOSE 156*  BUN 13  CREATININE 0.77  CALCIUM 8.5*  GFRNONAA >60  ANIONGAP 8    No results for input(s): PROT, ALBUMIN, AST, ALT, ALKPHOS, BILITOT in the last 168 hours. Lipids No results for input(s): CHOL, TRIG, HDL, LABVLDL, LDLCALC, CHOLHDL in the last 168 hours.  Hematology Recent Labs  Lab 07/23/21 0906  WBC 5.9  RBC 3.75*  HGB 11.9*  HCT 34.9*  MCV 93.1  MCH 31.7  MCHC 34.1  RDW 16.0*  PLT 153   Thyroid No results for input(s): TSH, FREET4 in the last 168 hours.  BNP Recent Labs  Lab 07/23/21 0905  BNP 145.5*    DDimer No results for input(s): DDIMER in the last 168 hours.   Radiology/Studies:  DG Chest Portable 1 View  Result Date: 07/23/2021 CLINICAL DATA:  Bradycardia and syncope.  Found unresponsive. EXAM: PORTABLE CHEST 1 VIEW COMPARISON:  11/18/2020 FINDINGS: Heart is enlarged, stable compared to prior study. No focal consolidations or pleural effusions. No pulmonary edema. IMPRESSION: Stable cardiomegaly. Electronically Signed  By: Nolon Nations M.D.   On: 07/23/2021 09:32     Assessment and Plan:   Syncope-near Pain with lesions on her right hand consistent with zoster Orthostatic hypotension Hypertension Atrial fibrillation-persistent Left bundle branch block-old First-degree AV block-new-transient Mitral regurgitation-moderate with a posteriorly directed jet (question underestimated)   The patient had a near syncopal episode.  By definition, the absence of loss of postural tone, he does not qualify as syncope.  (Most people who leaned back against the wall will actually tumble).  Be that as it may, if we think it is a syncopal episode, I suspect it was neurally  mediated. She has a history of orthostatic intolerance upon standing, and this was likely aggravated in the context of her significant pain related to what is presumed to be zoster.  The fact that her blood pressure was low on arrival of EMS (by report) with a heart rate in the mid to high 30s and that her blood pressure was normal in the ER following fluids with a heart rate in the high 30s to the mid 40s strongly speaks against the fact that the bradycardia was the cause of the hypotension.  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  Hence, I do not think that there is any indication for pacing  Left bundle branch block is also concerning in a patient with syncope.  Based on the ISSUE trial approximately half of these people will have significant bradycardia arrhythmias in the context of recurrent syncope.  Typically, bradycardia arrhythmic syncope is abrupt in onset and offset and does not linger for minutes and minutes as it did in this patient's case well through the arrival of EMS.  In the event that she has recurrent syncope particularly of abrupt in onset and offset, would consider a loop recorder  There is an issue that we will struggle with however related to blood pressure management.  She has systolic supine hypertension with blood pressures in the emergency room recorded 180--200.  This will require some background therapies and we may need to resort to things that are relatively short acting like hydralazine taken at bedtime.  We will stop the metoprolol as her heart rates have not been an issue since the use of amiodarone for atrial fibrillation and seek other medications to manage her blood pressure.  Her event recorder from a year ago does not suggest that she has significant sinus node dysfunction.  We will follow her overnight telemetry.  I will  defer to Dr. Rockey Situ as to whether she needs a TEE to better evaluate her mitral regurgitation as a posteriorly directed jet can be associated with significant underestimation of the severity of her MR.  Recommendations 1-okay to discharge 2-discontinue metoprolol 3-amlodipine 2.5 daily and hydralazine 25 mg nightly 4-raise the head of her bed on 4 inch blocks at home            Risk Assessment/Risk Scores:     For questions or updates, please contact [place] Please consult [email or web address] for contact info under    Signed, Virl Axe, MD  07/23/2021 2:36 PM

## 2021-07-23 NOTE — H&P (Addendum)
History and Physical:    [name]   HAL:[id number] DOB: 08/31/1941 DOA: 07/23/2021  PCP: Steele Sizer, MD  Chief Complaint: Syncope   History of Present Illness:    HPI: [name] is a 80 y.o. female with a past medical history of CHF, hypertension, hyperlipidemia, hypothyroidism, persistent atrial fibrillation, anxiety.  This patient presents to the emergency department after having a syncopal episode at home.  She was attempting to get up for breakfast and felt some prodromal symptoms with the room spinning and was feeling like she was going to pass out therefore she sat down.  She ended up passing out after she sat down.  Her husband called EMS.  She was passed out for approximately 4 to 5 minutes.  She did not hit her head or fall.  Husband present at bedside assisting with the history.  She reports prior history of the same approximately 1 year ago.  Denies any chest pain.  Denies any fevers or chills.  No seizure-like activity reported by husband.  When EMS arrived her systolic blood pressure was in the 80s with a heart rate in the 30s.  She was given a 500 cc bolus.  Hypotension has resolved in the emergency department.  She regained consciousness and has no focal neurological deficits on exam.  Essentially neuro exam is unremarkable. No recent illness.  Denies any nausea, vomiting or diarrhea.  She also endorses upper back pain that radiates across her back and down her arm.  She has a mild erythematous rash on her forearm and hand.  Is concerned about shingles.  She has received the shingles vaccine.  Going on for approximately 4 weeks describes the pain as sharp and burning and occasionally the rash is itchy.  ED Course: ED provider discussed the case with cardiologist Dr. Caryl Comes and he felt that pacemaker was not necessary at this moment and the patient could be admitted to our facility instead of being transferred to [facility]. Likely neurally-mediated.  They will see  the patient in consultation.  Hemodynamics have improved without IV fluids in the emergency department.  EKG shows A. fib with slow ventricular response.  Labs are largely unremarkable.  Chest x-ray shows no acute cardiopulmonary process.     ROS:   14 point review of systems is negative except for what is mentioned above in the HPI.   Past Medical History:   Past Medical History:  Diagnosis Date   Anxiety    Bronchitis    Chronic combined systolic (congestive) and diastolic (congestive) heart failure ([place])    a. 02/2018 Echo: EF 45-50%, diff HK, triv AI, mod MR, mildly dil LA/RA, nl RV fxn, mod TR. PASP 40-38mmHg.   Chronic kidney disease    DJD (degenerative joint disease), cervical    History of stress test    a. 01/2008 MV: EF 76%. Fair ex tol. No ischemia.   Hyperlipidemia    Hypertension    Hypothyroid    LBBB (left bundle branch block)    Leg pain    a. 01/2018 ABI's wnl.   Lumbar spondylosis    Persistent atrial fibrillation ([place])    a. 01/2018 Event monitor: 45 runs of SVT, longest 14.5 sec. SVT felt to be Afib/flutter-->2% burden. Longest run of AF 4h 67m (CHA2DS2VASc = 5-->Xarelto); b. 03/2019 s/p DCCV; c. 04/2019 Repeat Amio load and DCCV.   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  Past Surgical History:   Past Surgical History:  Procedure Laterality Date   BACK SURGERY  07/2019   CARDIOVERSION N/A 03/26/2018   Procedure: CARDIOVERSION;  Surgeon: Nelva Bush, MD;  Location: [facility] ORS;  Service: Cardiovascular;  Laterality: N/A;   CARDIOVERSION N/A 04/24/2018   Procedure: CARDIOVERSION;  Surgeon: Minna Merritts, MD;  Location: [facility] ORS;  Service: Cardiovascular;  Laterality: N/A;   CATARACT EXTRACTION W/PHACO Right 07/12/2021   Procedure: CATARACT EXTRACTION PHACO AND INTRAOCULAR LENS PLACEMENT (IOC) RIGHT 16.92 01:30.8 ;  Surgeon: Birder Robson, MD;  Location: [place];  Service: Ophthalmology;   Laterality: Right;   ELECTROPHYSIOLOGY STUDY N/A 02/27/2012   Procedure: ELECTROPHYSIOLOGY STUDY;  Surgeon: Evans Lance, MD;  Location: [facility] CATH LAB;  Service: Cardiovascular;  Laterality: N/A;   EPS and ablation for SVT  5/13   slow pathway ablation by Dr Lovena Le   EYE SURGERY     surgery for detatched retina   NECK SURGERY  02/2020   SUPRAVENTRICULAR TACHYCARDIA ABLATION N/A 02/27/2012   Procedure: SUPRAVENTRICULAR TACHYCARDIA ABLATION;  Surgeon: Evans Lance, MD;  Location: [facility] CATH LAB;  Service: Cardiovascular;  Laterality: N/A;    Social History:   Social History   Socioeconomic History   Marital status: Widowed    Spouse name: Not on file   Number of children: 1   Years of education: Not on file   Highest education level: 12th grade  Occupational History   Occupation: Retired  Tobacco Use   Smoking status: Never   Smokeless tobacco: Never   Tobacco comments:    smoking cessation materials not required  Vaping Use   Vaping Use: Never used  Substance and Sexual Activity   Alcohol use: No   Drug use: No   Sexual activity: Not Currently    Partners: Male    Birth control/protection: Post-menopausal  Other Topics Concern   Not on file  Social History Narrative   Lives in [place], she has a boyfriend   She has cats, some inside, some outside    Social Determinants of Health   Financial Resource Strain: Low Risk    Difficulty of Paying Living Expenses: Not hard at all  Food Insecurity: No Food Insecurity   Worried About Charity fundraiser in the Last Year: Never true   Arboriculturist in the Last Year: Never true  Transportation Needs: No Transportation Needs   Lack of Transportation (Medical): No   Lack of Transportation (Non-Medical): No  Physical Activity: Inactive   Days of Exercise per Week: 0 days   Minutes of Exercise per Session: 0 min  Stress: No Stress Concern Present   Feeling of Stress : Only a little  Social Connections: Moderately Integrated    Frequency of Communication with Friends and Family: More than three times a week   Frequency of Social Gatherings with Friends and Family: More than three times a week   Attends Religious Services: More than 4 times per year   Active Member of [employer] or Organizations: Yes   Attends Archivist Meetings: More than 4 times per year   Marital Status: Widowed  Human resources officer Violence: Not At Risk   Fear of Current or Ex-Partner: No   Emotionally Abused: No   Physically Abused: No   Sexually Abused: No    Allergies:   Allergies  Allergen Reactions   Chocolate Swelling   Penicillins Swelling and Rash    Has patient had a PCN reaction causing  immediate rash, facial/tongue/throat swelling, SOB or lightheadedness with hypotension: Yes Has patient had a PCN reaction causing severe rash involving mucus membranes or skin necrosis: No Has patient had a PCN reaction that required hospitalization: No Has patient had a PCN reaction occurring within the last 10 years: No If all of the above answers are "NO", then may proceed with Cephalosporin use.    Codeine Nausea And Vomiting   Erythromycin Itching and Swelling   Septra [Sulfamethoxazole-Trimethoprim] Swelling    Family History:   Family History  Problem Relation Age of Onset   Alzheimer's disease Mother    Stroke Father    Breast cancer Father    Heart disease Father        Pacemaker   Breast cancer Paternal Aunt    Kidney cancer Maternal Grandmother    Alzheimer's disease Maternal Grandfather    Thyroid disease Paternal Grandmother    Stroke Paternal Grandfather      Current Medications:   Prior to Admission medications   Medication Sig Start Date End Date Taking? Authorizing Provider  acetaminophen (TYLENOL) 650 MG CR tablet Take 650-1,300 mg by mouth every 8 (eight) hours as needed for pain.     [provider]  amiodarone (PACERONE) 200 MG tablet TAKE 1 TABLET(200 MG) BY MOUTH DAILY 01/03/21   Minna Merritts, MD  apixaban (ELIQUIS) 5 MG TABS tablet TAKE 1 TABLET(5 MG) BY MOUTH TWICE DAILY 02/16/21   Minna Merritts, MD  baclofen (LIORESAL) 10 MG tablet Take 1 tablet (10 mg total) by mouth 3 (three) times daily. Patient not taking: Reported on 07/01/2021 06/22/21   Margarette Canada, NP  busPIRone (BUSPAR) 5 MG tablet TAKE 1 TABLET(5 MG) BY MOUTH TWICE DAILY 07/06/21   Steele Sizer, MD  cholecalciferol (VITAMIN D) 1000 UNITS tablet Take 2,000 Units by mouth daily.    [provider]  Cranberry 500 MG TABS Take 2 tablets by mouth daily.    [provider]  doxazosin (CARDURA) 1 MG tablet Take 1 tablet (1 mg total) by mouth every evening. 11/05/20   Minna Merritts, MD  furosemide (LASIX) 20 MG tablet Take 1 tablet (20 mg total) by mouth See admin instructions. Take 1 tablet (20mg ) by mouth every morning - take 1 additional tablet (20mg ) after lunch if needed for swelling 11/05/20   Minna Merritts, MD  levothyroxine (SYNTHROID) 137 MCG tablet Take 1 tablet (137 mcg total) by mouth daily before breakfast. 03/14/21   Steele Sizer, MD  loratadine (CLARITIN) 10 MG tablet Take 10 mg by mouth daily.    [provider]  methylPREDNISolone (MEDROL DOSEPAK) 4 MG TBPK tablet Take according to the package insert. 06/22/21   Margarette Canada, NP  metoprolol succinate (TOPROL-XL) 50 MG 24 hr tablet Take with or immediately following a meal. Patient not taking: Reported on 07/01/2021 11/05/20   Minna Merritts, MD  Multiple Vitamin (MULITIVITAMIN WITH MINERALS) TABS Take 1 tablet by mouth daily.    [provider]  potassium chloride (KLOR-CON) 10 MEQ tablet TAKE 1 TABLET BY MOUTH EVERY DAY AS DIRECTED. MAY TAKE AN EXTRA PILL AFTER LUNCH WITH FLUID PILL AS NEEDED FOR SWELLING 11/05/20   Gollan, Kathlene November, MD  rosuvastatin (CRESTOR) 40 MG tablet TAKE 1 TABLET(40 MG) BY MOUTH DAILY 01/03/21   Ancil Boozer, Drue Stager, MD  temazepam (RESTORIL) 15 MG capsule TAKE 1 CAPSULE(15 MG) BY MOUTH AT  BEDTIME AS NEEDED FOR SLEEP 07/15/21   Steele Sizer, MD  valsartan (DIOVAN) 160  MG tablet Take 1 tablet (160 mg total) by mouth daily. Take in the morning 11/05/20   Minna Merritts, MD     Physical Exam:   Vitals:   07/23/21 0858 07/23/21 0859 07/23/21 0904 07/23/21 1015  BP: (!) 123/47   (!) 157/49  Pulse: (!) 39   (!) 42  Resp: 15   16  Temp:   (!) 97 F (36.1 C)   SpO2: 99%   100%  Weight:  54 kg    Height:  5\' 6"  (1.676 m)       General:  Appears calm and comfortable and is in NAD Cardiovascular: Irregular rhythm with slow rate Respiratory:   CTA bilaterally with no wheezes/rales/rhonchi.  Normal respiratory effort. Abdomen:  soft, NT, ND, NABS Skin:  no rash or induration seen on limited exam Musculoskeletal:  grossly normal tone BUE/BLE, good ROM, no bony abnormality Lower extremity:  No LE edema.  Limited foot exam with no ulcerations.  2+ distal pulses. Psychiatric:  grossly normal mood and affect, speech fluent and appropriate, AOx3 Neurologic:  CN 2-12 grossly intact, moves all extremities in coordinated fashion, sensation intact    Data Review:    Radiological Exams on Admission: Independently reviewed - see discussion in A/P where applicable  DG Chest Portable 1 View  Result Date: 07/23/2021 CLINICAL DATA:  Bradycardia and syncope.  Found unresponsive. EXAM: PORTABLE CHEST 1 VIEW COMPARISON:  11/18/2020 FINDINGS: Heart is enlarged, stable compared to prior study. No focal consolidations or pleural effusions. No pulmonary edema. IMPRESSION: Stable cardiomegaly. Electronically Signed   By: Nolon Nations M.D.   On: 07/23/2021 09:32    EKG: Independently reviewed.  Atrial fibrillation with LBBB, rate 37 bpm   Labs on Admission: I have personally reviewed the available labs and imaging studies at the time of the admission.  Pertinent labs on Admission: Blood glucose 156, hemoglobin 11.9, BNP 149, troponin 7     Assessment/Plan:    Syncope: Was  discussed with Dr. Caryl Comes from cardiology and given that the patient remains bradycardic but is no longer hypotensive or near syncopal he doubts the bradycardia as the specific etiology of the patient's syncopal episode.  He agreed with admission for monitoring. Likely neurally mediated. The patient will be admitted to the stepdown unit with cardiac monitoring. Formally consult cardiology.  Obtain orthostatic vital signs daily.  Obtain echocardiogram.  Obtain carotid duplex ultrasound.  Shingles- Discussed with ID. Can start Valtrex although minimal benefit at this point given its been 4 weeks since symptoms started. Start gabapentin to help with neuropathic pain.  Symptomatic bradycardia: Plan as above  Atrial fibrillation with slow ventricular response: Discontinue Toprol XL per cardiology. Hold home amiodarone until further discussed/clarified with cardiology.  Continue home Eliquis.  Hypothyroidism: Continue home Synthroid.  Obtain TSH  Anxiety: Continue home BuSpar  Dyslipidemia: Continue home Crestor  Insomnia: Continue home Restoril  Hypertension: Hold home valsartan until further discussed/clarified with cardiology.  Cardiology recommended starting amlodipine 2.5 mg daily and hydralazine 25 mg nightly.  CHF: Not in acute decompensation.  Continue home Lasix.    Other information:    Level of Care: Stepdown DVT prophylaxis: Eliquis Code Status: Full code Consults: Cardiology Admission status: Inpatient   Leslee Home DO [facility]   How to contact the [facility] Attending or Consulting provider [place] or covering provider during after hours [place], for this patient?  Check the care team in [facility] and look for a) attending/consulting [facility] provider listed  and b) the [facility] team listed Log into [email or web address] and use [place]'s universal password to access. If you do not have the password, please contact the hospital operator. Locate the [facility] provider you are looking for under  [facility] and page to a number that you can be directly reached. If you still have difficulty reaching the provider, please page the [facility] (Director on Call) for the Hospitalists listed on amion for assistance.   07/23/2021, 1:01 PM

## 2021-07-24 ENCOUNTER — Inpatient Hospital Stay (HOSPITAL_COMMUNITY)
Admit: 2021-07-24 | Discharge: 2021-07-24 | Disposition: A | Discharge status: Home / Self Care | Payer: Medicare Other | Attending: Family Medicine | Admitting: Family Medicine

## 2021-07-24 DIAGNOSIS — I951 Orthostatic hypotension: Secondary | ICD-10-CM

## 2021-07-24 DIAGNOSIS — R001 Bradycardia, unspecified: Secondary | ICD-10-CM

## 2021-07-24 DIAGNOSIS — R55 Syncope and collapse: Secondary | ICD-10-CM | POA: Diagnosis not present

## 2021-07-24 DIAGNOSIS — R112 Nausea with vomiting, unspecified: Secondary | ICD-10-CM

## 2021-07-24 LAB — BASIC METABOLIC PANEL
Anion gap: 6 (ref 5–15)
BUN: 11 mg/dL (ref 8–23)
CO2: 28 mmol/L (ref 22–32)
Calcium: 8.9 mg/dL (ref 8.9–10.3)
Chloride: 103 mmol/L (ref 98–111)
Creatinine, Ser: 0.69 mg/dL (ref 0.44–1.00)
GFR, Estimated: >60 mL/min (ref >60)
Glucose, Bld: 114 mg/dL — ABNORMAL HIGH (ref 70–99)
Potassium: 3.7 mmol/L (ref 3.5–5.1)
Sodium: 137 mmol/L (ref 135–145)

## 2021-07-24 LAB — ECHOCARDIOGRAM COMPLETE
AR max vel: 1.97 cm2
AV Peak grad: 8.8 mmHg
Ao pk vel: 1.48 m/s
Area-P 1/2: 4.24 cm2
Calc EF: 48.1 %
Height: 66 in
S' Lateral: 3.2 cm
Single Plane A2C EF: 44.1 %
Single Plane A4C EF: 52.1 %
Weight: 1961.21 oz

## 2021-07-24 LAB — CBC
HCT: 37.8 % (ref 36.0–46.0)
Hemoglobin: 13 g/dL (ref 12.0–15.0)
MCH: 31.7 pg (ref 26.0–34.0)
MCHC: 34.4 g/dL (ref 30.0–36.0)
MCV: 92.2 fL (ref 80.0–100.0)
Platelets: 158 10*3/uL (ref 150–400)
RBC: 4.1 MIL/uL (ref 3.87–5.11)
RDW: 15.8 % — ABNORMAL HIGH (ref 11.5–15.5)
WBC: 6.4 10*3/uL (ref 4.0–10.5)
nRBC: 0 % (ref 0.0–0.2)

## 2021-07-24 MED ORDER — SODIUM CHLORIDE 0.9 % IV SOLN
12.5000 mg | Freq: Four times a day (QID) | INTRAVENOUS | Status: DC | PRN
  Filled 2021-07-24: qty 0.5

## 2021-07-24 MED ORDER — SODIUM CHLORIDE 0.9 % IV BOLUS
250.0000 mL | Freq: Once | INTRAVENOUS | Status: AC
  Administered 2021-07-24: 250 mL via INTRAVENOUS

## 2021-07-24 MED ORDER — GABAPENTIN 100 MG PO CAPS
100.0000 mg | ORAL_CAPSULE | Freq: Three times a day (TID) | ORAL | Status: DC
  Administered 2021-07-24 – 2021-07-25 (×3): 100 mg via ORAL
  Filled 2021-07-24 (×3): qty 1

## 2021-07-24 NOTE — Progress Notes (Signed)
*  PRELIMINARY RESULTS* Echocardiogram 2D Echocardiogram has been performed.  [name] 07/24/2021, 11:00 AM

## 2021-07-24 NOTE — Progress Notes (Signed)
PROGRESS NOTE    [name]   UKG:[id number]  DOB: 1941/08/30  DOA: 07/23/2021 PCP: Steele Sizer, MD   Brief Narrative:  [name] is a 80 year old female with persistent atrial fibrillation, history of heart failure, hypertension, hyperlipidemia, hypothyroidism and anxiety.  The patient presented to the hospital for near syncope.  Was found in the chair by her significant other minimally responsive.  When EMS arrived to pick her up it was noted that her heart rate was in the 76E and her systolic blood pressure was in the 80s.  She was given 500 cc of normal saline and hypotension improved.  She is awake alert and oriented in the ED.  She is on amiodarone at home which was discontinued.   Subjective: She had complaints of pain in her right arm when I saw her this morning.  She states that the pain has been ongoing for 3 weeks but the rash began a couple of days ago.    Assessment & Plan:   Principal Problem: Orthostatic hypotension, near syncope Chronic diastolic dysfunction - Prior 2D echo revealed grade 1 diastolic dysfunction with a normal EF -Given IV fluids in the ED -orthostatic vitals checked again today and BP does drop a little however, she was not symptomatic but she has had nausea and vomiting today and I feel that I should give her another 250 cc fluid bolus -2D echo repeated this morning and report is still pending  Active Problems:  Vomiting - as risk for further orthostatic hypotension - follow I and O - treat symptomatically with antiemetics   AF (paroxysmal atrial fibrillation) (HCC)   Bradycardia -Amiodarone is currently on hold and heart rate has come up into the 60s    Hypertension -Currently receiving a very low-dose of amlodipine (2.5 mg) and 1 dose of hydralazine towards the evening-follow BP    Hypothyroid -TSH was checked yesterday and was found to be within normal range    Time spent in minutes: 25 DVT prophylaxis: Place TED hose  Start: 07/24/21 1235 apixaban (ELIQUIS) tablet 5 mg  Code Status: full code Family Communication:  Level of Care: Level of care: Progressive Cardiac Disposition Plan:  Status is: Inpatient  Remains inpatient appropriate because:Inpatient level of care appropriate due to severity of illness  Dispo: The patient is from: Home              Anticipated d/c is to: Home              Patient currently is not medically stable to d/c.   Difficult to place patient No      Consultants:  cardiology Procedures:  2 D ECHO Antimicrobials:  Anti-infectives (From admission, onward)    Start     Dose/Rate Route Frequency Ordered Stop   07/23/21 1700  valACYclovir (VALTREX) tablet 1,000 mg        1,000 mg Oral 3 times daily 07/23/21 1600 07/30/21 1559        Objective: Vitals:   07/24/21 1238 07/24/21 1239 07/24/21 1240 07/24/21 1243  BP: (!) 159/64 (!) 154/62 (!) 110/57 131/62  Pulse: 67 70 63 72  Resp: (!) 23 (!) 24 20 19   Temp:      TempSrc:      SpO2: 97% 97% 97% 100%  Weight:      Height:        Intake/Output Summary (Last 24 hours) at 07/24/2021 1401 Last data filed at 07/24/2021 0900 Gross per 24 hour  Intake 300  ml  Output 600 ml  Net -300 ml   Filed Weights   07/23/21 0859 07/23/21 1548  Weight: 54 kg 55.6 kg    Examination: General exam: Appears comfortable  HEENT: PERRLA, oral mucosa moist, no sclera icterus or thrush Respiratory system: Clear to auscultation. Respiratory effort normal. Cardiovascular system: S1 & S2 heard, RRR.   Gastrointestinal system: Abdomen soft, non-tender, nondistended. Normal bowel sounds. Central nervous system: Alert and oriented. No focal neurological deficits. Extremities: No cyanosis, clubbing or edema Skin: No rashes or ulcers Psychiatry:  Mood & affect appropriate.     Data Reviewed: I have personally reviewed following labs and imaging studies  CBC: Recent Labs  Lab 07/23/21 0906 07/24/21 0351  WBC 5.9 6.4   NEUTROABS 4.5  --   HGB 11.9* 13.0  HCT 34.9* 37.8  MCV 93.1 92.2  PLT 153 932   Basic Metabolic Panel: Recent Labs  Lab 07/23/21 0906 07/24/21 0351  NA 138 137  K 3.7 3.7  CL 105 103  CO2 25 28  GLUCOSE 156* 114*  BUN 13 11  CREATININE 0.77 0.69  CALCIUM 8.5* 8.9   GFR: Estimated Creatinine Clearance: 50.9 mL/min (by C-G formula based on SCr of 0.69 mg/dL). Liver Function Tests: No results for input(s): AST, ALT, ALKPHOS, BILITOT, PROT, ALBUMIN in the last 168 hours. No results for input(s): LIPASE, AMYLASE in the last 168 hours. No results for input(s): AMMONIA in the last 168 hours. Coagulation Profile: No results for input(s): INR, PROTIME in the last 168 hours. Cardiac Enzymes: No results for input(s): CKTOTAL, CKMB, CKMBINDEX, TROPONINI in the last 168 hours. BNP (last 3 results) No results for input(s): PROBNP in the last 8760 hours. HbA1C: No results for input(s): HGBA1C in the last 72 hours. CBG: Recent Labs  Lab 07/23/21 1555  GLUCAP 137*   Lipid Profile: No results for input(s): CHOL, HDL, LDLCALC, TRIG, CHOLHDL, LDLDIRECT in the last 72 hours. Thyroid Function Tests: Recent Labs    07/23/21 1230  TSH 1.583   Anemia Panel: No results for input(s): VITAMINB12, FOLATE, FERRITIN, TIBC, IRON, RETICCTPCT in the last 72 hours. Urine analysis:    Component Value Date/Time   COLORURINE STRAW (A) 10/30/2020 1624   APPEARANCEUR CLEAR (A) 10/30/2020 1624   APPEARANCEUR Clear 04/27/2020 1135   LABSPEC 1.005 10/30/2020 1624   PHURINE 5.0 10/30/2020 1624   GLUCOSEU NEGATIVE 10/30/2020 1624   HGBUR SMALL (A) 10/30/2020 1624   BILIRUBINUR NEGATIVE 10/30/2020 1624   BILIRUBINUR Negative 04/27/2020 1135   KETONESUR NEGATIVE 10/30/2020 1624   PROTEINUR NEGATIVE 10/30/2020 1624   UROBILINOGEN 0.2 02/02/2020 1402   NITRITE NEGATIVE 10/30/2020 1624   LEUKOCYTESUR NEGATIVE 10/30/2020 1624   Sepsis Labs: @LABRCNTIP (procalcitonin:4,lacticidven:4) ) Recent  Results (from the past 240 hour(s))  Resp Panel by RT-PCR (Flu A&B, Covid) Nasopharyngeal Swab     Status: None   Collection Time: 07/23/21  9:05 AM   Specimen: Nasopharyngeal Swab; Nasopharyngeal(NP) swabs in vial transport medium  Result Value Ref Range Status   SARS Coronavirus 2 by RT PCR NEGATIVE NEGATIVE Final    Comment: (NOTE) SARS-CoV-2 target nucleic acids are NOT DETECTED.  The SARS-CoV-2 RNA is generally detectable in upper respiratory specimens during the acute phase of infection. The lowest concentration of SARS-CoV-2 viral copies this assay can detect is 138 copies/mL. A negative result does not preclude SARS-Cov-2 infection and should not be used as the sole basis for treatment or other patient management decisions. A negative result may occur with  improper  specimen collection/handling, submission of specimen other than nasopharyngeal swab, presence of viral mutation(s) within the areas targeted by this assay, and inadequate number of viral copies(<138 copies/mL). A negative result must be combined with clinical observations, patient history, and epidemiological information. The expected result is Negative.  Fact Sheet for Patients:  EntrepreneurPulse.com.au  Fact Sheet for Healthcare Providers:  IncredibleEmployment.be  This test is no t yet approved or cleared by the [place] FDA and  has been authorized for detection and/or diagnosis of SARS-CoV-2 by FDA under an Emergency Use Authorization (EUA). This EUA will remain  in effect (meaning this test can be used) for the duration of the COVID-19 declaration under Section 564(b)(1) of the Act, 21 U.S.C.section 360bbb-3(b)(1), unless the authorization is terminated  or revoked sooner.       Influenza A by PCR NEGATIVE NEGATIVE Final   Influenza B by PCR NEGATIVE NEGATIVE Final    Comment: (NOTE) The Xpert Xpress SARS-CoV-2/FLU/RSV plus assay is intended as an aid in the  diagnosis of influenza from Nasopharyngeal swab specimens and should not be used as a sole basis for treatment. Nasal washings and aspirates are unacceptable for Xpert Xpress SARS-CoV-2/FLU/RSV testing.  Fact Sheet for Patients: EntrepreneurPulse.com.au  Fact Sheet for Healthcare Providers: IncredibleEmployment.be  This test is not yet approved or cleared by the [place] FDA and has been authorized for detection and/or diagnosis of SARS-CoV-2 by FDA under an Emergency Use Authorization (EUA). This EUA will remain in effect (meaning this test can be used) for the duration of the COVID-19 declaration under Section 564(b)(1) of the Act, 21 U.S.C. section 360bbb-3(b)(1), unless the authorization is terminated or revoked.  Performed at [facility], [address] [place], [place] [address]   MRSA Next Gen by PCR, Nasal     Status: None   Collection Time: 07/23/21  3:50 PM   Specimen: Nasal Mucosa; Nasal Swab  Result Value Ref Range Status   MRSA by PCR Next Gen NOT DETECTED NOT DETECTED Final    Comment: (NOTE) The GeneXpert MRSA Assay (FDA approved for NASAL specimens only), is one component of a comprehensive MRSA colonization surveillance program. It is not intended to diagnose MRSA infection nor to guide or monitor treatment for MRSA infections. Test performance is not FDA approved in patients less than 29 years old. Performed at [facility], [address] [place], [place] [address]          Radiology Studies: US Carotid Bilateral  Result Date: 07/23/2021 CLINICAL DATA:  Syncope. EXAM: BILATERAL CAROTID DUPLEX ULTRASOUND TECHNIQUE: Pearline Cables scale imaging, color Doppler and duplex ultrasound were performed of bilateral carotid and vertebral arteries in the neck. COMPARISON:  December 13, 2009. FINDINGS: Criteria: Quantification of carotid stenosis is based on velocity parameters that correlate the residual  internal carotid diameter with NASCET-based stenosis levels, using the diameter of the distal internal carotid lumen as the denominator for stenosis measurement. The following velocity measurements were obtained: RIGHT ICA: 78 cm/sec CCA: 72 cm/sec SYSTOLIC ICA/CCA RATIO:  1.1 ECA: 56 cm/sec LEFT ICA: 70 cm/sec CCA: 57 cm/sec SYSTOLIC ICA/CCA RATIO: 1.2 ECA: 69 cm/sec RIGHT CAROTID ARTERY: Mild atherosclerotic plaque in the internal carotid artery. RIGHT VERTEBRAL ARTERY:  Antegrade flow. LEFT CAROTID ARTERY: Mild atherosclerotic plaque in the common and proximal internal carotid arteries. LEFT VERTEBRAL ARTERY:  Antegrade flow. IMPRESSION: 1. No hemodynamically significant stenosis. 2. 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 Electronically Signed   By: Titus Dubin M.D.   On:  07/23/2021 15:38   DG Chest Portable 1 View  Result Date: 07/23/2021 CLINICAL DATA:  Bradycardia and syncope.  Found unresponsive. EXAM: PORTABLE CHEST 1 VIEW COMPARISON:  11/18/2020 FINDINGS: Heart is enlarged, stable compared to prior study. No focal consolidations or pleural effusions. No pulmonary edema. IMPRESSION: Stable cardiomegaly. Electronically Signed   By: Nolon Nations M.D.   On: 07/23/2021 09:32      Scheduled Meds:  amLODipine  2.5 mg Oral Daily   apixaban  5 mg Oral BID   busPIRone  5 mg Oral BID   Chlorhexidine Gluconate Cloth  6 each Topical Q0600   gabapentin  100 mg Oral TID   hydrALAZINE  25 mg Oral QHS   levothyroxine  137 mcg Oral QAC breakfast   loratadine  10 mg Oral Daily   rosuvastatin  40 mg Oral Daily   valACYclovir  1,000 mg Oral TID   Continuous Infusions:  promethazine (PHENERGAN) injection (IM or IVPB)       LOS: 1 day      Debbe Odea, MD [facility] Pager: [email or web address] 07/24/2021, 2:01 PM

## 2021-07-25 MED ORDER — VALACYCLOVIR HCL 1 G PO TABS: 1000.0000 mg | ORAL_TABLET | Freq: Three times a day (TID) | ORAL | 0 refills | Status: AC

## 2021-07-25 MED ORDER — HYDRALAZINE HCL 25 MG PO TABS: 25.0000 mg | ORAL_TABLET | Freq: Every day | ORAL | 0 refills | Status: DC

## 2021-07-25 MED ORDER — CHLORHEXIDINE GLUCONATE CLOTH 2 % EX PADS: 6.0000 | MEDICATED_PAD | Freq: Every day | CUTANEOUS | Status: DC

## 2021-07-25 MED ORDER — AMLODIPINE BESYLATE 2.5 MG PO TABS: 2.5000 mg | ORAL_TABLET | Freq: Every day | ORAL | 0 refills | Status: DC

## 2021-07-25 MED ORDER — GABAPENTIN 100 MG PO CAPS
100.0000 mg | ORAL_CAPSULE | Freq: Three times a day (TID) | ORAL | 0 refills | Status: DC | PRN
Start: 2021-07-25 — End: 2021-08-02

## 2021-07-25 NOTE — Progress Notes (Signed)
Pt ambulated in hallway this AM. No issues.   Pt discharged from ICU @ 1220.

## 2021-07-25 NOTE — Discharge Summary (Signed)
Physician Discharge Summary  [name] SVX:[id number] DOB: [birth year] DOA: 07/23/2021  PCP: Steele Sizer, MD  Admit date: 07/23/2021 Discharge date: 07/25/2021  Admitted From: home  Disposition:  home   Recommendations for Outpatient Follow-up:  F/u for orthostatic hypotension  Home Health:  none  Discharge Condition:  stable   CODE STATUS:  Full code   Diet recommendation:  heart healthy Consultations: cardiology  Procedures/Studies: 2 D eCHO   Discharge Diagnoses:  Principal Problem:   Orthostatic hypotension Active Problems:   AF (paroxysmal atrial fibrillation) - with slow ventricular response   Hypertension   Hypothyroid   PAD (peripheral artery disease) ([place])   CKD (chronic kidney disease), stage III ([place])   Chronic diastolic CHF (congestive heart failure) ([place])     Brief Summary: [name] is a 80 year old female with persistent atrial fibrillation, history of heart failure, hypertension, hyperlipidemia, hypothyroidism and anxiety.  The patient presented to the hospital for near syncope.  Was found in the chair by her significant other minimally responsive.  When EMS arrived to pick her up it was noted that her heart rate was in the 23R and her systolic blood pressure was in the 80s.  She was given 500 cc of normal saline and hypotension improved.  She is awake alert and oriented in the ED.  She is on amiodarone at home which was discontinued.    Hospital Course:  Principal Problem: Orthostatic hypotension, near syncope Chronic diastolic dysfunction - Prior 2D echo revealed grade 1 diastolic dysfunction with a normal EF -Given IV fluids in the ED -orthostatic vitals checked again and BP does drop a little however, she was not symptomatic  -2D echo shows an EF of 55-60 % and grade 1 diastolic dysfunction   Active Problems:   Vomiting - treated symptomatically with antiemetics and has resolved    AF (paroxysmal atrial fibrillation) (HCC)    Bradycardia -Amiodarone on hold and heart rate has come up into the 60s - cardiology recommends continuing to hold Amiodarone      Hypertension -Currently receiving a very low-dose of amlodipine (2.5 mg) and 1 dose of hydralazine towards the evening-follow BP     Hypothyroid -TSH was checked yesterday and was found to be within normal range       Discharge Exam: Vitals:   07/25/21 0700 07/25/21 0800  BP:  (!) 151/48  Pulse: 66 67  Resp: 13 19  Temp:    SpO2: 98% 98%   Vitals:   07/25/21 0232 07/25/21 0600 07/25/21 0700 07/25/21 0800  BP: 127/61   (!) 151/48  Pulse: 74 67 66 67  Resp: 19 18 13 19   Temp:      TempSrc:      SpO2: 93% 96% 98% 98%  Weight:      Height:        General: Pt is alert, awake, not in acute distress Cardiovascular: RRR, S1/S2 +, no rubs, no gallops Respiratory: CTA bilaterally, no wheezing, no rhonchi Abdominal: Soft, NT, ND, bowel sounds + Extremities: no edema, no cyanosis   Discharge Instructions  Discharge Instructions     Diet - low sodium heart healthy   Complete by: As directed    Increase activity slowly   Complete by: As directed       Allergies as of 07/25/2021       Reactions   Chocolate Swelling   Penicillins Swelling, Rash   Has patient had a PCN reaction causing immediate rash, facial/tongue/throat swelling, SOB or  lightheadedness with hypotension: Yes Has patient had a PCN reaction causing severe rash involving mucus membranes or skin necrosis: No Has patient had a PCN reaction that required hospitalization: No Has patient had a PCN reaction occurring within the last 10 years: No If all of the above answers are "NO", then may proceed with Cephalosporin use.   Codeine Nausea And Vomiting   Erythromycin Itching, Swelling   Septra [sulfamethoxazole-trimethoprim] Swelling        Medication List     STOP taking these medications    amiodarone 200 MG tablet Commonly known as: PACERONE       TAKE these  medications    acetaminophen 650 MG CR tablet Commonly known as: TYLENOL Take 650-1,300 mg by mouth every 8 (eight) hours as needed for pain.   amLODipine 2.5 MG tablet Commonly known as: NORVASC Take 1 tablet (2.5 mg total) by mouth daily.   busPIRone 5 MG tablet Commonly known as: BUSPAR TAKE 1 TABLET(5 MG) BY MOUTH TWICE DAILY   cholecalciferol 1000 units tablet Commonly known as: VITAMIN D Take 2,000 Units by mouth daily.   Cranberry 500 MG Tabs Take 2 tablets by mouth daily.   doxazosin 1 MG tablet Commonly known as: Cardura Take 1 tablet (1 mg total) by mouth every evening.   Eliquis 5 MG Tabs tablet Generic drug: apixaban TAKE 1 TABLET(5 MG) BY MOUTH TWICE DAILY   gabapentin 100 MG capsule Commonly known as: NEURONTIN Take 1 capsule (100 mg total) by mouth 3 (three) times daily as needed.   hydrALAZINE 25 MG tablet Commonly known as: APRESOLINE Take 1 tablet (25 mg total) by mouth at bedtime.   levothyroxine 137 MCG tablet Commonly known as: SYNTHROID Take 1 tablet (137 mcg total) by mouth daily before breakfast.   loratadine 10 MG tablet Commonly known as: CLARITIN Take 10 mg by mouth daily.   multivitamin with minerals Tabs tablet Take 1 tablet by mouth daily.   potassium chloride 10 MEQ tablet Commonly known as: KLOR-CON TAKE 1 TABLET BY MOUTH EVERY DAY AS DIRECTED. MAY TAKE AN EXTRA PILL AFTER LUNCH WITH FLUID PILL AS NEEDED FOR SWELLING   rosuvastatin 40 MG tablet Commonly known as: CRESTOR TAKE 1 TABLET(40 MG) BY MOUTH DAILY   temazepam 15 MG capsule Commonly known as: RESTORIL TAKE 1 CAPSULE(15 MG) BY MOUTH AT BEDTIME AS NEEDED FOR SLEEP   valACYclovir 1000 MG tablet Commonly known as: VALTREX Take 1 tablet (1,000 mg total) by mouth 3 (three) times daily for 4 days.        Allergies  Allergen Reactions   Chocolate Swelling   Penicillins Swelling and Rash    Has patient had a PCN reaction causing immediate rash, facial/tongue/throat  swelling, SOB or lightheadedness with hypotension: Yes Has patient had a PCN reaction causing severe rash involving mucus membranes or skin necrosis: No Has patient had a PCN reaction that required hospitalization: No Has patient had a PCN reaction occurring within the last 10 years: No If all of the above answers are "NO", then may proceed with Cephalosporin use.    Codeine Nausea And Vomiting   Erythromycin Itching and Swelling   Septra [Sulfamethoxazole-Trimethoprim] Swelling      US Carotid Bilateral  Result Date: 07/23/2021 CLINICAL DATA:  Syncope. EXAM: BILATERAL CAROTID DUPLEX ULTRASOUND TECHNIQUE: Pearline Cables scale imaging, color Doppler and duplex ultrasound were performed of bilateral carotid and vertebral arteries in the neck. COMPARISON:  December 13, 2009. FINDINGS: Criteria: Quantification of carotid stenosis is based on  velocity parameters that correlate the residual internal carotid diameter with NASCET-based stenosis levels, using the diameter of the distal internal carotid lumen as the denominator for stenosis measurement. The following velocity measurements were obtained: RIGHT ICA: 78 cm/sec CCA: 72 cm/sec SYSTOLIC ICA/CCA RATIO:  1.1 ECA: 56 cm/sec LEFT ICA: 70 cm/sec CCA: 57 cm/sec SYSTOLIC ICA/CCA RATIO: 1.2 ECA: 69 cm/sec RIGHT CAROTID ARTERY: Mild atherosclerotic plaque in the internal carotid artery. RIGHT VERTEBRAL ARTERY:  Antegrade flow. LEFT CAROTID ARTERY: Mild atherosclerotic plaque in the common and proximal internal carotid arteries. LEFT VERTEBRAL ARTERY:  Antegrade flow. IMPRESSION: 1. No hemodynamically significant stenosis. 2. 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 Electronically Signed   By: Titus Dubin M.D.   On: 07/23/2021 15:38   DG Chest Portable 1 View  Result Date: 07/23/2021 CLINICAL DATA:  Bradycardia and syncope.  Found unresponsive. EXAM: PORTABLE CHEST 1 VIEW COMPARISON:  11/18/2020 FINDINGS: Heart is enlarged, stable compared  to prior study. No focal consolidations or pleural effusions. No pulmonary edema. IMPRESSION: Stable cardiomegaly. Electronically Signed   By: Nolon Nations M.D.   On: 07/23/2021 09:32   ECHOCARDIOGRAM COMPLETE  Result Date: 07/24/2021    ECHOCARDIOGRAM REPORT   Patient Name:   MALIAH PYLES Date of Exam: 07/24/2021 Medical Rec #:  [id number]      Height:       66.0 in Accession #:    [id number]     Weight:       122.6 lb Date of Birth:  [birth year]     BSA:          1.624 m Patient Age:    21 years       BP:           146/48 mmHg Patient Gender: F              HR:           61 bpm. Exam Location:  [facility] Procedure: 2D Echo Indications:     Syncope R55  History:         Patient has prior history of Echocardiogram examinations, most                  recent 10/31/2020.  Sonographer:     Kathlen Brunswick RDCS Referring Phys:  [place] Diagnosing Phys: Nelva Bush MD IMPRESSIONS  1. Left ventricular ejection fraction, by estimation, is 55 to 60%. The left ventricle has normal function. The left ventricle has no regional wall motion abnormalities. There is moderate left ventricular hypertrophy. Left ventricular diastolic parameters are consistent with Grade I diastolic dysfunction (impaired relaxation).  2. Right ventricular systolic function is normal. The right ventricular size is normal. There is normal pulmonary artery systolic pressure.  3. The mitral valve is abnormal. Mild mitral valve regurgitation. No evidence of mitral stenosis.  4. Tricuspid valve regurgitation is mild to moderate.  5. The aortic valve is tricuspid. There is mild calcification of the aortic valve. There is mild thickening of the aortic valve. Aortic valve regurgitation is mild. Mild to moderate aortic valve sclerosis/calcification is present, without any evidence of aortic stenosis.  6. The inferior vena cava is normal in size with greater than 50% respiratory variability, suggesting right atrial pressure of 3 mmHg.  FINDINGS  Left Ventricle: Left ventricular ejection fraction, by estimation, is 55 to 60%. The left ventricle has normal function. The left ventricle has no regional wall motion abnormalities. The left ventricular internal cavity size was  normal in size. There is  moderate left ventricular hypertrophy. Left ventricular diastolic parameters are consistent with Grade I diastolic dysfunction (impaired relaxation). Right Ventricle: The right ventricular size is normal. Right vetricular wall thickness was not well visualized. Right ventricular systolic function is normal. There is normal pulmonary artery systolic pressure. The tricuspid regurgitant velocity is 2.62 m/s, and with an assumed right atrial pressure of 3 mmHg, the estimated right ventricular systolic pressure is 99.2 mmHg. Left Atrium: Left atrial size was normal in size. Right Atrium: Right atrial size was normal in size. Pericardium: There is no evidence of pericardial effusion. Presence of pericardial fat pad. Mitral Valve: The mitral valve is abnormal. Mild to moderate mitral annular calcification. Mild mitral valve regurgitation. No evidence of mitral valve stenosis. Tricuspid Valve: The tricuspid valve is not well visualized. Tricuspid valve regurgitation is mild to moderate. Aortic Valve: The aortic valve is tricuspid. There is mild calcification of the aortic valve. There is mild thickening of the aortic valve. Aortic valve regurgitation is mild. Mild to moderate aortic valve sclerosis/calcification is present, without any evidence of aortic stenosis. Aortic valve peak gradient measures 8.8 mmHg. Pulmonic Valve: The pulmonic valve was not well visualized. Pulmonic valve regurgitation is trivial. No evidence of pulmonic stenosis. Aorta: The aortic root and ascending aorta are structurally normal, with no evidence of dilitation. Pulmonary Artery: The pulmonary artery is of normal size. Venous: The inferior vena cava is normal in size with greater than  50% respiratory variability, suggesting right atrial pressure of 3 mmHg. IAS/Shunts: The interatrial septum was not well visualized.  LEFT VENTRICLE PLAX 2D LVIDd:         4.10 cm     Diastology LVIDs:         3.20 cm     LV e' medial:    4.24 cm/s LV PW:         1.40 cm     LV E/e' medial:  12.5 LV IVS:        1.40 cm     LV e' lateral:   3.15 cm/s LVOT diam:     2.10 cm     LV E/e' lateral: 16.9 LV SV:         57 LV SV Index:   35 LVOT Area:     3.46 cm  LV Volumes (MOD) LV vol d, MOD A2C: 82.3 ml LV vol d, MOD A4C: 91.7 ml LV vol s, MOD A2C: 46.0 ml LV vol s, MOD A4C: 43.9 ml LV SV MOD A2C:     36.3 ml LV SV MOD A4C:     91.7 ml LV SV MOD BP:      43.2 ml RIGHT VENTRICLE RV Basal diam:  2.40 cm RV S prime:     13.70 cm/s TAPSE (M-mode): 1.7 cm LEFT ATRIUM             Index       RIGHT ATRIUM           Index LA diam:        4.60 cm 2.83 cm/m  RA Area:     12.90 cm LA Vol (A2C):   55.1 ml 33.93 ml/m RA Volume:   27.20 ml  16.75 ml/m LA Vol (A4C):   48.3 ml 29.74 ml/m LA Biplane Vol: 52.6 ml 32.39 ml/m  AORTIC VALVE                PULMONIC VALVE AV Area (Vmax): 1.97 cm  PV Vmax:       1.30 m/s AV Vmax:        148.00 cm/s PV Peak grad:  6.8 mmHg AV Peak Grad:   8.8 mmHg LVOT Vmax:      84.20 cm/s LVOT Vmean:     53.700 cm/s LVOT VTI:       0.164 m  AORTA Ao Root diam: 2.80 cm Ao Asc diam:  3.20 cm MITRAL VALVE               TRICUSPID VALVE MV Area (PHT): 4.24 cm    TV Peak grad:   25.9 mmHg MV Decel Time: 179 msec    TV Vmax:        2.54 m/s MV E velocity: 53.10 cm/s  TR Peak grad:   27.5 mmHg MV A velocity: 61.70 cm/s  TR Vmax:        262.00 cm/s MV E/A ratio:  0.86                            SHUNTS                            Systemic VTI:  0.16 m                            Systemic Diam: 2.10 cm Nelva Bush MD Electronically signed by Nelva Bush MD Signature Date/Time: 07/24/2021/3:15:04 PM    Final      The results of significant diagnostics from this hospitalization (including imaging,  microbiology, ancillary and laboratory) are listed below for reference.     Microbiology: Recent Results (from the past 240 hour(s))  Resp Panel by RT-PCR (Flu A&B, Covid) Nasopharyngeal Swab     Status: None   Collection Time: 07/23/21  9:05 AM   Specimen: Nasopharyngeal Swab; Nasopharyngeal(NP) swabs in vial transport medium  Result Value Ref Range Status   SARS Coronavirus 2 by RT PCR NEGATIVE NEGATIVE Final    Comment: (NOTE) SARS-CoV-2 target nucleic acids are NOT DETECTED.  The SARS-CoV-2 RNA is generally detectable in upper respiratory specimens during the acute phase of infection. The lowest concentration of SARS-CoV-2 viral copies this assay can detect is 138 copies/mL. A negative result does not preclude SARS-Cov-2 infection and should not be used as the sole basis for treatment or other patient management decisions. A negative result may occur with  improper specimen collection/handling, submission of specimen other than nasopharyngeal swab, presence of viral mutation(s) within the areas targeted by this assay, and inadequate number of viral copies(<138 copies/mL). A negative result must be combined with clinical observations, patient history, and epidemiological information. The expected result is Negative.  Fact Sheet for Patients:  EntrepreneurPulse.com.au  Fact Sheet for Healthcare Providers:  IncredibleEmployment.be  This test is no t yet approved or cleared by the [place] FDA and  has been authorized for detection and/or diagnosis of SARS-CoV-2 by FDA under an Emergency Use Authorization (EUA). This EUA will remain  in effect (meaning this test can be used) for the duration of the COVID-19 declaration under Section 564(b)(1) of the Act, 21 U.S.C.section 360bbb-3(b)(1), unless the authorization is terminated  or revoked sooner.       Influenza A by PCR NEGATIVE NEGATIVE Final   Influenza B by PCR NEGATIVE NEGATIVE  Final    Comment: (NOTE) The Xpert Xpress SARS-CoV-2/FLU/RSV plus assay  is intended as an aid in the diagnosis of influenza from Nasopharyngeal swab specimens and should not be used as a sole basis for treatment. Nasal washings and aspirates are unacceptable for Xpert Xpress SARS-CoV-2/FLU/RSV testing.  Fact Sheet for Patients: EntrepreneurPulse.com.au  Fact Sheet for Healthcare Providers: IncredibleEmployment.be  This test is not yet approved or cleared by the [place] FDA and has been authorized for detection and/or diagnosis of SARS-CoV-2 by FDA under an Emergency Use Authorization (EUA). This EUA will remain in effect (meaning this test can be used) for the duration of the COVID-19 declaration under Section 564(b)(1) of the Act, 21 U.S.C. section 360bbb-3(b)(1), unless the authorization is terminated or revoked.  Performed at [facility], [address] [place], [place] [address]   MRSA Next Gen by PCR, Nasal     Status: None   Collection Time: 07/23/21  3:50 PM   Specimen: Nasal Mucosa; Nasal Swab  Result Value Ref Range Status   MRSA by PCR Next Gen NOT DETECTED NOT DETECTED Final    Comment: (NOTE) The GeneXpert MRSA Assay (FDA approved for NASAL specimens only), is one component of a comprehensive MRSA colonization surveillance program. It is not intended to diagnose MRSA infection nor to guide or monitor treatment for MRSA infections. Test performance is not FDA approved in patients less than 57 years old. Performed at [facility], [address] [place], [place] [address]      Labs: BNP (last 3 results) Recent Labs    11/18/20 0957 11/19/20 2338 07/23/21 0905  BNP 248.6* 207.8* 237.6*   Basic Metabolic Panel: Recent Labs  Lab 07/23/21 0906 07/24/21 0351  NA 138 137  K 3.7 3.7  CL 105 103  CO2 25 28  GLUCOSE 156* 114*  BUN 13 11  CREATININE 0.77 0.69  CALCIUM 8.5* 8.9   Liver  Function Tests: No results for input(s): AST, ALT, ALKPHOS, BILITOT, PROT, ALBUMIN in the last 168 hours. No results for input(s): LIPASE, AMYLASE in the last 168 hours. No results for input(s): AMMONIA in the last 168 hours. CBC: Recent Labs  Lab 07/23/21 0906 07/24/21 0351  WBC 5.9 6.4  NEUTROABS 4.5  --   HGB 11.9* 13.0  HCT 34.9* 37.8  MCV 93.1 92.2  PLT 153 158   Cardiac Enzymes: No results for input(s): CKTOTAL, CKMB, CKMBINDEX, TROPONINI in the last 168 hours. BNP: Invalid input(s): POCBNP CBG: Recent Labs  Lab 07/23/21 1555  GLUCAP 137*   D-Dimer No results for input(s): DDIMER in the last 72 hours. Hgb A1c No results for input(s): HGBA1C in the last 72 hours. Lipid Profile No results for input(s): CHOL, HDL, LDLCALC, TRIG, CHOLHDL, LDLDIRECT in the last 72 hours. Thyroid function studies Recent Labs    07/23/21 1230  TSH 1.583   Anemia work up No results for input(s): VITAMINB12, FOLATE, FERRITIN, TIBC, IRON, RETICCTPCT in the last 72 hours. Urinalysis    Component Value Date/Time   COLORURINE STRAW (A) 10/30/2020 1624   APPEARANCEUR CLEAR (A) 10/30/2020 1624   APPEARANCEUR Clear 04/27/2020 1135   LABSPEC 1.005 10/30/2020 1624   PHURINE 5.0 10/30/2020 1624   GLUCOSEU NEGATIVE 10/30/2020 1624   HGBUR SMALL (A) 10/30/2020 1624   BILIRUBINUR NEGATIVE 10/30/2020 1624   BILIRUBINUR Negative 04/27/2020 1135   KETONESUR NEGATIVE 10/30/2020 1624   PROTEINUR NEGATIVE 10/30/2020 1624   UROBILINOGEN 0.2 02/02/2020 1402   NITRITE NEGATIVE 10/30/2020 1624   LEUKOCYTESUR NEGATIVE 10/30/2020 1624   Sepsis Labs Invalid input(s): PROCALCITONIN,  WBC,  LACTICIDVEN Microbiology Recent Results (from the past 240 hour(s))  Resp Panel by RT-PCR (Flu A&B, Covid) Nasopharyngeal Swab     Status: None   Collection Time: 07/23/21  9:05 AM   Specimen: Nasopharyngeal Swab; Nasopharyngeal(NP) swabs in vial transport medium  Result Value Ref Range Status   SARS Coronavirus  2 by RT PCR NEGATIVE NEGATIVE Final    Comment: (NOTE) SARS-CoV-2 target nucleic acids are NOT DETECTED.  The SARS-CoV-2 RNA is generally detectable in upper respiratory specimens during the acute phase of infection. The lowest concentration of SARS-CoV-2 viral copies this assay can detect is 138 copies/mL. A negative result does not preclude SARS-Cov-2 infection and should not be used as the sole basis for treatment or other patient management decisions. A negative result may occur with  improper specimen collection/handling, submission of specimen other than nasopharyngeal swab, presence of viral mutation(s) within the areas targeted by this assay, and inadequate number of viral copies(<138 copies/mL). A negative result must be combined with clinical observations, patient history, and epidemiological information. The expected result is Negative.  Fact Sheet for Patients:  EntrepreneurPulse.com.au  Fact Sheet for Healthcare Providers:  IncredibleEmployment.be  This test is no t yet approved or cleared by the [place] FDA and  has been authorized for detection and/or diagnosis of SARS-CoV-2 by FDA under an Emergency Use Authorization (EUA). This EUA will remain  in effect (meaning this test can be used) for the duration of the COVID-19 declaration under Section 564(b)(1) of the Act, 21 U.S.C.section 360bbb-3(b)(1), unless the authorization is terminated  or revoked sooner.       Influenza A by PCR NEGATIVE NEGATIVE Final   Influenza B by PCR NEGATIVE NEGATIVE Final    Comment: (NOTE) The Xpert Xpress SARS-CoV-2/FLU/RSV plus assay is intended as an aid in the diagnosis of influenza from Nasopharyngeal swab specimens and should not be used as a sole basis for treatment. Nasal washings and aspirates are unacceptable for Xpert Xpress SARS-CoV-2/FLU/RSV testing.  Fact Sheet for Patients: EntrepreneurPulse.com.au  Fact  Sheet for Healthcare Providers: IncredibleEmployment.be  This test is not yet approved or cleared by the [place] FDA and has been authorized for detection and/or diagnosis of SARS-CoV-2 by FDA under an Emergency Use Authorization (EUA). This EUA will remain in effect (meaning this test can be used) for the duration of the COVID-19 declaration under Section 564(b)(1) of the Act, 21 U.S.C. section 360bbb-3(b)(1), unless the authorization is terminated or revoked.  Performed at [facility], [address] [place], [place] [address]   MRSA Next Gen by PCR, Nasal     Status: None   Collection Time: 07/23/21  3:50 PM   Specimen: Nasal Mucosa; Nasal Swab  Result Value Ref Range Status   MRSA by PCR Next Gen NOT DETECTED NOT DETECTED Final    Comment: (NOTE) The GeneXpert MRSA Assay (FDA approved for NASAL specimens only), is one component of a comprehensive MRSA colonization surveillance program. It is not intended to diagnose MRSA infection nor to guide or monitor treatment for MRSA infections. Test performance is not FDA approved in patients less than 66 years old. Performed at [facility], [address], [place], [place] [address]      Time coordinating discharge in minutes: 65  SIGNED:   Debbe Odea, MD  [facility] 07/25/2021, 9:29 AM

## 2021-07-26 ENCOUNTER — Telehealth: Payer: Self-pay | Admitting: Physician Assistant

## 2021-07-26 NOTE — Telephone Encounter (Signed)
Fyi   Patient called to schedule hospital fu for syncope .  Patient notes she has shingles but denies openings or oozing at this time.    Onset of shingles unknown .

## 2021-07-27 ENCOUNTER — Telehealth: Payer: Self-pay

## 2021-07-27 NOTE — Telephone Encounter (Signed)
Transition Care Management Follow-up Telephone Call Date of discharge and from where: 07/25/21 [facility] How have you been since you were released from the hospital? Pt states doing okay but still feels weak Any questions or concerns? No  Items Reviewed: Did the pt receive and understand the discharge instructions provided? Yes  Medications obtained and verified? Yes  Other? No  Any new allergies since your discharge? No  Dietary orders reviewed? Yes Do you have support at home? Yes   Home Care and Equipment/Supplies: Were home health services ordered? no Were any new equipment or medical supplies ordered?  No   Functional Questionnaire: (I = Independent and D = Dependent) ADLs: I  Bathing/Dressing- I  Meal Prep- I  Eating- I  Maintaining continence- I  Transferring/Ambulation- I  Managing Meds- I  Follow up appointments reviewed:  PCP Hospital f/u appt confirmed? No  pt planning to follow up with cardiology Specialist Hospital f/u appt confirmed? Yes  Scheduled to see Christell [facility] on 08/03/21. Are transportation arrangements needed? No  If their condition worsens, is the pt aware to call PCP or go to the Emergency Dept.? Yes Was the patient provided with contact information for the PCP's office or ED? Yes Was to pt encouraged to call back with questions or concerns? Yes

## 2021-08-01 NOTE — Progress Notes (Signed)
Cardiology Office Note    Date:  08/03/2021   ID:  ARTELIA GAME, DOB 12/22/1940, MRN [id number]  PCP:  Leslie Sizer, MD  Cardiologist:  Leslie Rogue, MD  Electrophysiologist:  None   Chief Complaint: Hospital follow-up  History of Present Illness:   [name] is a 80 y.o. female with history of AVNRT status post catheter ablation, PAF status post multiple DCCV's as outlined below, chronic combined systolic and diastolic CHF, orthostasis, LBBB, HTN, HLD, degenerative joint disease, leg pain with normal ABIs in 01/2018, and mild carotid artery disease who presents for hospital follow-up as detailed below.  Zio patch in 01/2018 showed a predominant rhythm of sinus with an average rate of 66 bpm (range 30 to 179 bpm), bundle branch block/IVCD was present, 45 runs of SVT were noted with the fastest interval lasting 13 beats and the longest interval lasting 14.5 seconds, some episodes of SVT may have been a short bursts of A. fib/flutter, A. fib occurred with a 2% burden with the longest episode lasting 4 hours and 24 minutes, second-degree AV block type I was present.  In 11/2017 and she was found to have frequent PVCs and fusion beats with subsequent event monitoring showing runs of SVT as well as runs of A. fib.  She was placed on Xarelto.  She was seen in clinic in 02/2018 and noted to be in A. fib with RVR despite beta-blocker and calcium channel blocker therapy.  In this setting, she was placed on amiodarone with plans for outpatient DCCV.  However, she was admitted with a CHF exacerbation in the context of A. fib with RVR in 03/2018.  She was cardioverted at that time, though had recurrent A. fib and heart failure in 04/2018 requiring repeat amiodarone and DCCV.    She was admitted to the hospital in 10/2020 with acute respiratory failure and hypoxia in the context of multifocal pneumonia treated with antibiotics, steroids, and nebulizers.  Echo at that time showed an EF of 60 to 65%, no  regional wall motion abnormalities, grade 1 diastolic dysfunction, normal RV systolic function and ventricular cavity size, moderately elevated PASP estimated at 54.8 mmHg, and mild to moderate mitral valve regurgitation with 2 jets noted.  She was admitted to the hospital earlier this month from 10/1 through 10/3 with near syncope and possible orthostatic hypotension.  She had been in severe pain related to what was felt to be herpes zoster.  BP in the ED ranged from 100-200 with heart rates in the 40s to 60s.  High-sensitivity troponin normal x2, BNP 145, COVID and influenza negative.  Carotid artery ultrasound showed no hemodynamically significant stenosis bilaterally.  Echo showed an EF of 55 to 60%, no regional wall motion abnormalities, moderate LVH, grade 1 diastolic dysfunction, normal RV systolic function and ventricular cavity size, normal PASP, mild mitral valve regurgitation, mild to moderate tricuspid regurgitation, mild aortic valve insufficiency, mild to moderate aortic valve sclerosis without evidence of stenosis, and an estimated right atrial pressure of 3 mmHg.  She was evaluated by EP with symptoms felt to be related to known orthostatic tolerance and exacerbated by pain from her presumed zoster infection.  It was felt her bradycardia was not contributing to her presentation.  Her transient first-degree AV block was felt to be related to hyper vagotonia.  There was no indication for pacing.  With regards to her symptoms and known LBBB, given this was not abrupt onset and offset this was felt to be unlikely  contributing with recommendation for loop recorder if symptoms recur.  EP recommended discontinuation of metoprolol.  At time of discharge, primary service held amiodarone.  She comes in doing well from a cardiac perspective.  She has not had any further presyncopal episodes.  No chest pain, dyspnea, palpitations, dizziness, or syncope.  No lower extremity swelling.  Her discomfort from her  shingles persists, though is improving.  Her gabapentin was recently titrated given ongoing neuropathic pain.  She has not received the shingles vaccine.  She also notes some unsteadiness on her feet, not described as dizziness, which she attributes to having recently undergone cataract surgery and needing to have her corrective lenses adjusted, which is scheduled for next week.  No falls.  No hematochezia or melena.  Her blood pressure remains well controlled at home in the 734L to 937T systolic.  She feels like she is improving.   Labs independently reviewed: 07/2021 - Hgb 13.0, PLT 158, potassium 3.7, BUN 11, serum creatinine 0.69, TSH normal 11/2020 - magnesium 2.5 10/2020 - albumin 2.6, AST/ALT normal 08/2020 - A1c 5.2, TC 134, TG 174, HDL 56, LDL 53  Past Medical History:  Diagnosis Date   Anxiety    Bronchitis    Chronic combined systolic (congestive) and diastolic (congestive) heart failure ([place])    a. 02/2018 Echo: EF 45-50%, diff HK, triv AI, mod MR, mildly dil LA/RA, nl RV fxn, mod TR. PASP 40-25mmHg.   Chronic kidney disease    DJD (degenerative joint disease), cervical    History of stress test    a. 01/2008 MV: EF 76%. Fair ex tol. No ischemia.   Hyperlipidemia    Hypertension    Hypothyroid    LBBB (left bundle branch block)    Leg pain    a. 01/2018 ABI's wnl.   Lumbar spondylosis    Persistent atrial fibrillation ([place])    a. 01/2018 Event monitor: 45 runs of SVT, longest 14.5 sec. SVT felt to be Afib/flutter-->2% burden. Longest run of AF 4h 80m (CHA2DS2VASc = 5-->Xarelto); b. 03/2019 s/p DCCV; c. 04/2019 Repeat Amio load and DCCV.   SVT (supraventricular tachycardia) ([place])    a. AVNRT - s/p ablation by Dr [name] 5/13    Past Surgical History:  Procedure Laterality Date   BACK SURGERY  07/2019   CARDIOVERSION N/A 03/26/2018   Procedure: CARDIOVERSION;  Surgeon: Leslie Bush, MD;  Location: [facility] ORS;  Service: Cardiovascular;  Laterality: N/A;   CARDIOVERSION N/A  04/24/2018   Procedure: CARDIOVERSION;  Surgeon: Leslie Merritts, MD;  Location: [facility] ORS;  Service: Cardiovascular;  Laterality: N/A;   CATARACT EXTRACTION W/PHACO Right 07/12/2021   Procedure: CATARACT EXTRACTION PHACO AND INTRAOCULAR LENS PLACEMENT (IOC) RIGHT 16.92 01:30.8 ;  Surgeon: Birder Robson, MD;  Location: [facility];  Service: Ophthalmology;  Laterality: Right;   ELECTROPHYSIOLOGY STUDY N/A 02/27/2012   Procedure: ELECTROPHYSIOLOGY STUDY;  Surgeon: Evans Lance, MD;  Location: [facility] CATH LAB;  Service: Cardiovascular;  Laterality: N/A;   EPS and ablation for SVT  5/13   slow pathway ablation by Dr [name]   EYE SURGERY     surgery for detatched retina   NECK SURGERY  02/2020   SUPRAVENTRICULAR TACHYCARDIA ABLATION N/A 02/27/2012   Procedure: SUPRAVENTRICULAR TACHYCARDIA ABLATION;  Surgeon: Evans Lance, MD;  Location: [facility] CATH LAB;  Service: Cardiovascular;  Laterality: N/A;    Current Medications: Current Meds  Medication Sig   acetaminophen (TYLENOL) 650 MG CR tablet Take 650-1,300 mg by mouth every 8 (  eight) hours as needed for pain.    amiodarone (PACERONE) 200 MG tablet Take 1 tablet (200 mg total) by mouth daily.   amLODipine (NORVASC) 2.5 MG tablet Take 1 tablet (2.5 mg total) by mouth daily.   apixaban (ELIQUIS) 5 MG TABS tablet TAKE 1 TABLET(5 MG) BY MOUTH TWICE DAILY   busPIRone (BUSPAR) 5 MG tablet TAKE 1 TABLET(5 MG) BY MOUTH TWICE DAILY   cholecalciferol (VITAMIN D) 1000 UNITS tablet Take 2,000 Units by mouth daily.   Cranberry 500 MG TABS Take 2 tablets by mouth daily.   doxazosin (CARDURA) 1 MG tablet Take 1 tablet (1 mg total) by mouth every evening.   furosemide (LASIX) 20 MG tablet Take 20 mg by mouth.   gabapentin (NEURONTIN) 300 MG capsule Take 1 capsule (300 mg total) by mouth 3 (three) times daily.   levothyroxine (SYNTHROID) 137 MCG tablet Take 1 tablet (137 mcg total) by mouth daily before breakfast.   loratadine (CLARITIN) 10 MG tablet Take 10  mg by mouth daily.   Multiple Vitamin (MULITIVITAMIN WITH MINERALS) TABS Take 1 tablet by mouth daily.   potassium chloride (KLOR-CON) 10 MEQ tablet TAKE 1 TABLET BY MOUTH EVERY DAY AS DIRECTED. MAY TAKE AN EXTRA PILL AFTER LUNCH WITH FLUID PILL AS NEEDED FOR SWELLING   rosuvastatin (CRESTOR) 40 MG tablet TAKE 1 TABLET(40 MG) BY MOUTH DAILY   temazepam (RESTORIL) 15 MG capsule TAKE 1 CAPSULE(15 MG) BY MOUTH AT BEDTIME AS NEEDED FOR SLEEP    Allergies:   Chocolate, Penicillins, Codeine, Erythromycin, and Septra [sulfamethoxazole-trimethoprim]   Social History   Socioeconomic History   Marital status: Widowed    Spouse name: Not on file   Number of children: 1   Years of education: Not on file   Highest education level: 12th grade  Occupational History   Occupation: Retired  Tobacco Use   Smoking status: Never   Smokeless tobacco: Never   Tobacco comments:    smoking cessation materials not required  Vaping Use   Vaping Use: Never used  Substance and Sexual Activity   Alcohol use: No   Drug use: No   Sexual activity: Not Currently    Partners: Male    Birth control/protection: Post-menopausal  Other Topics Concern   Not on file  Social History Narrative   Lives in [place], she has a boyfriend   She has cats, some inside, some outside    Social Determinants of Health   Financial Resource Strain: Low Risk    Difficulty of Paying Living Expenses: Not hard at all  Food Insecurity: No Food Insecurity   Worried About Charity fundraiser in the Last Year: Never true   Arboriculturist in the Last Year: Never true  Transportation Needs: No Transportation Needs   Lack of Transportation (Medical): No   Lack of Transportation (Non-Medical): No  Physical Activity: Inactive   Days of Exercise per Week: 0 days   Minutes of Exercise per Session: 0 min  Stress: No Stress Concern Present   Feeling of Stress : Only a little  Social Connections: Moderately Integrated   Frequency of  Communication with Friends and Family: More than three times a week   Frequency of Social Gatherings with Friends and Family: More than three times a week   Attends Religious Services: More than 4 times per year   Active Member of [employer] or Organizations: Yes   Attends Archivist Meetings: More than 4 times per year  Marital Status: Widowed     Family History:  The patient's family history includes Alzheimer's disease in her maternal grandfather and mother; Breast cancer in her father and paternal aunt; Heart disease in her father; Kidney cancer in her maternal grandmother; Stroke in her father and paternal grandfather; Thyroid disease in her paternal grandmother.  ROS:   Review of Systems  Constitutional:  Positive for malaise/fatigue. Negative for chills, diaphoresis, fever and weight loss.  HENT:  Negative for congestion.   Eyes:  Positive for blurred vision. Negative for discharge and redness.  Respiratory:  Negative for cough, sputum production, shortness of breath and wheezing.   Cardiovascular:  Negative for chest pain, palpitations, orthopnea, claudication, leg swelling and PND.  Gastrointestinal:  Negative for abdominal pain, blood in stool, heartburn, melena, nausea and vomiting.  Musculoskeletal:  Negative for falls and myalgias.  Skin:  Positive for rash.  Neurological:  Negative for dizziness, tingling, tremors, sensory change, speech change, focal weakness, loss of consciousness and weakness.       Gait unsteadiness  Endo/Heme/Allergies:  Does not bruise/bleed easily.  Psychiatric/Behavioral:  Negative for substance abuse. The patient is not nervous/anxious.   All other systems reviewed and are negative.   EKGs/Labs/Other Studies Reviewed:    Studies reviewed were summarized above. The additional studies were reviewed today:  2D echo 07/24/2021: 1. Left ventricular ejection fraction, by estimation, is 55 to 60%. The  left ventricle has normal function. The  left ventricle has no regional  wall motion abnormalities. There is moderate left ventricular hypertrophy.  Left ventricular diastolic  parameters are consistent with Grade I diastolic dysfunction (impaired  relaxation).   2. Right ventricular systolic function is normal. The right ventricular  size is normal. There is normal pulmonary artery systolic pressure.   3. The mitral valve is abnormal. Mild mitral valve regurgitation. No  evidence of mitral stenosis.   4. Tricuspid valve regurgitation is mild to moderate.   5. The aortic valve is tricuspid. There is mild calcification of the  aortic valve. There is mild thickening of the aortic valve. Aortic valve  regurgitation is mild. Mild to moderate aortic valve  sclerosis/calcification is present, without any evidence  of aortic stenosis.   6. The inferior vena cava is normal in size with greater than 50%  respiratory variability, suggesting right atrial pressure of 3 mmHg. __________  2D echo 10/31/2020: 1. Left ventricular ejection fraction, by estimation, is 60 to 65%. The  left ventricle has normal function. The left ventricle has no regional  wall motion abnormalities. Left ventricular diastolic parameters are  consistent with Grade I diastolic  dysfunction (impaired relaxation).   2. Right ventricular systolic function is normal. The right ventricular  size is normal. There is moderately elevated pulmonary artery systolic  pressure. The estimated right ventricular systolic pressure is 38.1 mmHg.   3. The mitral valve is normal in structure. Mild to moderate mitral valve  regurgitation. Two jets. No evidence of mitral stenosis. __________  2D echo 03/21/2018: - Left ventricle: The cavity size was normal. Wall thickness was    increased in a pattern of mild LVH. Systolic function was mildly    reduced. The estimated ejection fraction was in the range of 45%    to 50%. Diffuse hypokinesis.  - Aortic valve: There was trivial  regurgitation.  - Mitral valve: Mildly calcified annulus. There was moderate    regurgitation directed posteriorly.  - Left atrium: The atrium was mildly dilated.  - Right ventricle: The  cavity size was normal. Systolic function    was normal.  - Right atrium: The atrium was mildly dilated.  - Tricuspid valve: There was moderate regurgitation.  - Pulmonary arteries: Systolic pressure was mildly to moderately    increased, in the range of 40 mm Hg to 45 mm Hg.  - Inferior vena cava: The vessel was normal in size. The    respirophasic diameter changes were blunted (< 50%), consistent    with elevated central venous pressure.  - Pericardium, extracardiac: A trivial pericardial effusion was    identified.  __________  Elwyn Reach patch 01/2018: Normal sinus rhythm min HR of 30 bpm, max HR of 179 bpm, and avg HR of 66 bpm.   Bundle Branch Block/IVCD was present.   45 Supraventricular Tachycardia runs occurred, the run with the fastest interval lasting 13 beats with a max rate of 179 bpm, the longest lasting 14.5 secs with an avg rate of 96 bpm.    Some Episodes of Supraventricula Tachycardia May be short burst of Atrial fibrillation/Flutter.    Atrial Fibrillation occurred (2% burden), ranging from 56-129 bpm (avg of 79 bpm), the longest lasting 4 hours 24 mins with an avg rate of 79 bpm.    Second Degree AV Block-Mobitz I (Wenckebach) was present.    Atrial Fibrillation was detected within +/- 45 seconds of symptomatic patient event(s).   Other patient triggers with no arrhythmia noted   Isolated SVEs were rare (<1.0%), and no SVE Couplets or SVE Triplets were present. Isolated VEs were frequent (7.9%, 81203), VE Couplets were rare (<1.0%, 1332), and VE Triplets were rare (<1.0%, 71). Ventricular Bigeminy and Trigeminy were Present. __________  Carlton Adam MPI 02/01/2008: 1.     Fair exercise tolerance with no arrhythmia or ischemia.  2.     Normal LV function at 76% with no transient LV  dilatation or  regional  wall motion abnormality.  3.     Fixed septal defect consistent with LEFT bundle branch block. No  reversible ischemia noted.     EKG:  EKG is ordered today.  The EKG ordered today demonstrates NSR, 74 bpm, first-degree AV block, LBBB  Recent Labs: 11/21/2020: ALT 21 11/24/2020: Magnesium 2.5 07/23/2021: B Natriuretic Peptide 145.5; TSH 1.583 07/24/2021: BUN 11; Creatinine, Ser 0.69; Hemoglobin 13.0; Platelets 158; Potassium 3.7; Sodium 137  Recent Lipid Panel    Component Value Date/Time   CHOL 134 09/13/2020 0000   CHOL 164 06/06/2016 0907   TRIG 54 10/30/2020 0403   HDL 56 09/13/2020 0000   HDL 54 06/06/2016 0907   CHOLHDL 2.4 09/13/2020 0000   VLDL 32 (H) 03/12/2017 0815   LDLCALC 53 09/13/2020 0000    PHYSICAL EXAM:    VS:  BP (!) 136/59 (BP Location: Left Arm, Patient Position: Sitting, Cuff Size: Normal)   Pulse 74   Ht 5\' 6"  (1.676 m)   Wt 123 lb (55.8 kg)   SpO2 98%   BMI 19.85 kg/m   BMI: Body mass index is 19.85 kg/m.  Physical Exam Vitals reviewed.  Constitutional:      Appearance: She is well-developed.  HENT:     Head: Normocephalic and atraumatic.  Eyes:     General:        Right eye: No discharge.        Left eye: No discharge.  Neck:     Vascular: No JVD.  Cardiovascular:     Rate and Rhythm: Normal rate and regular rhythm.  Pulses:          Posterior tibial pulses are 2+ on the right side and 2+ on the left side.     Heart sounds: S1 normal and S2 normal. Heart sounds not distant. No midsystolic click and no opening snap. Murmur heard.  High-pitched blowing holosystolic murmur is present with a grade of 2/6 at the apex.    No friction rub.  Pulmonary:     Effort: Pulmonary effort is normal. No respiratory distress.     Breath sounds: Normal breath sounds. No decreased breath sounds, wheezing or rales.  Chest:     Chest wall: No tenderness.  Abdominal:     General: There is no distension.     Palpations: Abdomen  is soft.     Tenderness: There is no abdominal tenderness.  Musculoskeletal:     Cervical back: Normal range of motion.     Right lower leg: No edema.     Left lower leg: No edema.  Skin:    General: Skin is warm and dry.     Nails: There is no clubbing.  Neurological:     Mental Status: She is alert and oriented to person, place, and time.  Psychiatric:        Speech: Speech normal.        Behavior: Behavior normal.        Thought Content: Thought content normal.        Judgment: Judgment normal.     Wt Readings from Last 3 Encounters:  08/03/21 123 lb (55.8 kg)  08/02/21 122 lb 6.4 oz (55.5 kg)  07/23/21 122 lb 9.2 oz (55.6 kg)     Orthostatic vital signs:  Lying: 149/75, 82 bpm Sitting: 138/66, 71 bpm Standing: 110/57, 88 bpm, off balance Standing x 3 minutes: 108/63, 87 bpm  ASSESSMENT & PLAN:   Near syncope: No further episodes.  Possibly in the setting of orthostasis exacerbated by increased vagal tone in the context of severe pain from zoster.  No evidence of significant sinus node dysfunction on inpatient telemetry.  Should she have recurrent episodes, would recommend referral to EP for consideration of loop recorder given underlying LBBB, per EP recommendation.  PAF: Maintaining sinus rhythm.  Restart amiodarone 200 mg daily in an effort to maintain sinus rhythm given history of multiple DCCV.  Continue to hold metoprolol given prior bradycardia and 1st degree AV block.  CHADS2VASc at least 6 (CHF, HTN, age x2, vascular disease, sex category).  Continue Eliquis.   Stable HGB.  No symptoms of bleeding.   Mitral regurgitation: Echo has demonstrated a moderate posteriorly directed jet with question for possible underestimation.  We will plan for TEE once she is improved from her acute illness.  Chronic combined systolic and diastolic CHF: LV systolic function normalized by echo in 10/2020.  She appears euvolemic and well compensated.  No longer on valsartan or metoprolol  secondary to bradycardia, which  presumed to be vagally mediated, valsartan with orthostasis.  LBBB: QRS duration is stable.  As above.  First-degree AV block: PR interval is improved when compared to her admission after reviewing EP note from 10/1.  She remains off beta-blocker as above.  HTN: Blood pressure is well controlled in the office today.  We may need some degree of permissive hypertension given her history of orthostasis.  Continue low-dose amlodipine.  HLD: LDL 53 in 08/2020.  She remains on rosuvastatin.  Followed by PCP.  Disposition: F/u with Dr. Rockey Situ or an APP  in 2 months.   Medication Adjustments/Labs and Tests Ordered: Current medicines are reviewed at length with the patient today.  Concerns regarding medicines are outlined above. Medication changes, Labs and Tests ordered today are summarized above and listed in the Patient Instructions accessible in Encounters.   Signed, Christell Faith, PA-C 08/03/2021 12:35 PM     [place] [address] [place], [place] [address] [phone]

## 2021-08-02 ENCOUNTER — Ambulatory Visit (INDEPENDENT_AMBULATORY_CARE_PROVIDER_SITE_OTHER): Payer: Medicare Other | Admitting: Nurse Practitioner

## 2021-08-02 ENCOUNTER — Ambulatory Visit: Payer: Self-pay | Admitting: *Deleted

## 2021-08-02 ENCOUNTER — Encounter: Payer: Self-pay | Admitting: Nurse Practitioner

## 2021-08-02 ENCOUNTER — Other Ambulatory Visit: Payer: Self-pay

## 2021-08-02 VITALS — BP 124/64 | HR 97 | Temp 97.9°F | Resp 16 | Ht 66.0 in | Wt 122.4 lb

## 2021-08-02 DIAGNOSIS — I482 Chronic atrial fibrillation, unspecified: Secondary | ICD-10-CM | POA: Diagnosis not present

## 2021-08-02 DIAGNOSIS — B0223 Postherpetic polyneuropathy: Secondary | ICD-10-CM | POA: Diagnosis not present

## 2021-08-02 MED ORDER — GABAPENTIN 300 MG PO CAPS: 300.0000 mg | ORAL_CAPSULE | Freq: Three times a day (TID) | ORAL | 0 refills | Status: DC

## 2021-08-02 NOTE — Telephone Encounter (Signed)
Summary: Clinical Advice   Patient states shingles Is not improving but worsening. Patient experiencing right shoulder blade, arm pain for  6 weeks. gabapentin (NEURONTIN) , valacyclovir and hydrALAZINE (APRESOLINE) 25 MG tablet is not working. Patient states rash is drying up.  Patient seeking clinical advice.      Reason for Disposition  SEVERE pain (e.g., excruciating)  Answer Assessment - Initial Assessment Questions 1. APPEARANCE of RASH: "Describe the rash."      Drying up 2. LOCATION: "Where is the rash located?"      Started R shoulder bade to R arm 3. ONSET: "When did the rash start?"      6 weeks ago-pain- went to [facility]- at hospital -patient was diagnosed 4. ITCHING: "Does the rash itch?" If Yes, ask: "How bad is the itch?"  (Scale 1-10; or mild, moderate, severe)     Some- but better- burning 5. PAIN: "Does the rash hurt?" If Yes, ask: "How bad is the pain?"  (Scale 0-10; or none, mild, moderate, severe)    - NONE (0): no pain    - MILD (1-3): doesn't interfere with normal activities     - MODERATE (4-7): interferes with normal activities or awakens from sleep     - SEVERE (8-10): excruciating pain, unable to do any normal activities     Moderate/severe 6. OTHER SYMPTOMS: "Do you have any other symptoms?" (e.g., fever)     no 7. PREGNANCY: "Is there any chance you are pregnant?" "When was your last menstrual period?"     N/a  Protocols used: Shingles (Zoster)-A-AH

## 2021-08-02 NOTE — Telephone Encounter (Signed)
Call to patient- patient was diagnosed with shingles at ED on 07/23/21- she has finished treatment- and rash is drying up- but patient has significant pain. Appointment scheduled.

## 2021-08-02 NOTE — Progress Notes (Addendum)
BP 124/64 (BP Location: Left Arm, Patient Position: Sitting, Cuff Size: Small)   Pulse 97   Temp 97.9 F (36.6 C) (Oral)   Resp 16   Ht 5\' 6"  (1.676 m) Comment: per patient  Wt 122 lb 6.4 oz (55.5 kg)   SpO2 98%   BMI 19.76 kg/m    Subjective:    Patient ID: [name], female    DOB: 14-Apr-1941, 80 y.o.   MRN: [id number]  HPI: [name] is a 79 y.o. female, here alone  Chief Complaint  Patient presents with   Herpes Zoster    X2 weeks   Shingles: She says that her shingles started about two weeks ago.  It started on her right shoulder and has gone down her right arm.  She was diagnosed at the hospital and was put on Gabapentin and valacyclovir.  She completed treatment and says that the shingles rash is crusting over.  No new lesions.  She says she still has the pain.  She has been using topical lidocaine and ice packs to help with pain.  Pain is a 9/10 most of the time. Discussed options and will start with continuing the gabapentin.  She is agreeable with plan of care.   Afib:  She is going to see her cardiologist tomorrow.  She denies any chest pain, palpitations or shortness of breath.    Relevant past medical, surgical, family and social history reviewed and updated as indicated. Interim medical history since our last visit reviewed. Allergies and medications reviewed and updated.  Review of Systems  Constitutional: Negative for fever or weight change.  Respiratory: Negative for cough and shortness of breath.   Cardiovascular: Negative for chest pain or palpitations.  Gastrointestinal: Negative for abdominal pain, no bowel changes.  Musculoskeletal: Negative for gait problem or joint swelling.  Skin: positive for rash.  Neurological: Negative for dizziness or headache.  No other specific complaints in a complete review of systems (except as listed in HPI above).      Objective:    BP 124/64 (BP Location: Left Arm, Patient Position: Sitting, Cuff Size:  Small)   Pulse 97   Temp 97.9 F (36.6 C) (Oral)   Resp 16   Ht 5\' 6"  (1.676 m) Comment: per patient  Wt 122 lb 6.4 oz (55.5 kg)   SpO2 98%   BMI 19.76 kg/m   Wt Readings from Last 3 Encounters:  08/02/21 122 lb 6.4 oz (55.5 kg)  07/23/21 122 lb 9.2 oz (55.6 kg)  07/12/21 119 lb (54 kg)    Physical Exam  Constitutional: Patient appears well-developed and well-nourished. No distress.  HEENT: head atraumatic, normocephalic, pupils equal and reactive to light, neck supple Cardiovascular: Normal rate, irregular rhythm and normal heart sounds.  No murmur heard. No BLE edema. Pulmonary/Chest: Effort normal and breath sounds normal. No respiratory distress. Abdominal: Soft.  There is no tenderness. Skin: crusted pink rash spread across right shoulder and down right arm, no new lesions present Psychiatric: Patient has a normal mood and affect. behavior is normal. Judgment and thought content normal.   Results for orders placed or performed during the hospital encounter of 07/23/21  Resp Panel by RT-PCR (Flu A&B, Covid) Nasopharyngeal Swab   Specimen: Nasopharyngeal Swab; Nasopharyngeal(NP) swabs in vial transport medium  Result Value Ref Range   SARS Coronavirus 2 by RT PCR NEGATIVE NEGATIVE   Influenza A by PCR NEGATIVE NEGATIVE   Influenza B by PCR NEGATIVE NEGATIVE  MRSA Next  Gen by PCR, Nasal   Specimen: Nasal Mucosa; Nasal Swab  Result Value Ref Range   MRSA by PCR Next Gen NOT DETECTED NOT DETECTED  Basic metabolic panel  Result Value Ref Range   Sodium 138 135 - 145 mmol/L   Potassium 3.7 3.5 - 5.1 mmol/L   Chloride 105 98 - 111 mmol/L   CO2 25 22 - 32 mmol/L   Glucose, Bld 156 (H) 70 - 99 mg/dL   BUN 13 8 - 23 mg/dL   Creatinine, Ser 0.77 0.44 - 1.00 mg/dL   Calcium 8.5 (L) 8.9 - 10.3 mg/dL   GFR, Estimated >60 >60 mL/min   Anion gap 8 5 - 15  CBC with Differential  Result Value Ref Range   WBC 5.9 4.0 - 10.5 K/uL   RBC 3.75 (L) 3.87 - 5.11 MIL/uL   Hemoglobin 11.9  (L) 12.0 - 15.0 g/dL   HCT 34.9 (L) 36.0 - 46.0 %   MCV 93.1 80.0 - 100.0 fL   MCH 31.7 26.0 - 34.0 pg   MCHC 34.1 30.0 - 36.0 g/dL   RDW 16.0 (H) 11.5 - 15.5 %   Platelets 153 150 - 400 K/uL   nRBC 0.0 0.0 - 0.2 %   Neutrophils Relative % 75 %   Neutro Abs 4.5 1.7 - 7.7 K/uL   Lymphocytes Relative 12 %   Lymphs Abs 0.7 0.7 - 4.0 K/uL   Monocytes Relative 10 %   Monocytes Absolute 0.6 0.1 - 1.0 K/uL   Eosinophils Relative 1 %   Eosinophils Absolute 0.0 0.0 - 0.5 K/uL   Basophils Relative 1 %   Basophils Absolute 0.0 0.0 - 0.1 K/uL   Immature Granulocytes 1 %   Abs Immature Granulocytes 0.03 0.00 - 0.07 K/uL  Brain natriuretic peptide  Result Value Ref Range   B Natriuretic Peptide 145.5 (H) 0.0 - 100.0 pg/mL  TSH  Result Value Ref Range   TSH 1.583 0.350 - 4.500 uIU/mL  Glucose, capillary  Result Value Ref Range   Glucose-Capillary 137 (H) 70 - 99 mg/dL  Basic metabolic panel  Result Value Ref Range   Sodium 137 135 - 145 mmol/L   Potassium 3.7 3.5 - 5.1 mmol/L   Chloride 103 98 - 111 mmol/L   CO2 28 22 - 32 mmol/L   Glucose, Bld 114 (H) 70 - 99 mg/dL   BUN 11 8 - 23 mg/dL   Creatinine, Ser 0.69 0.44 - 1.00 mg/dL   Calcium 8.9 8.9 - 10.3 mg/dL   GFR, Estimated >60 >60 mL/min   Anion gap 6 5 - 15  CBC  Result Value Ref Range   WBC 6.4 4.0 - 10.5 K/uL   RBC 4.10 3.87 - 5.11 MIL/uL   Hemoglobin 13.0 12.0 - 15.0 g/dL   HCT 37.8 36.0 - 46.0 %   MCV 92.2 80.0 - 100.0 fL   MCH 31.7 26.0 - 34.0 pg   MCHC 34.4 30.0 - 36.0 g/dL   RDW 15.8 (H) 11.5 - 15.5 %   Platelets 158 150 - 400 K/uL   nRBC 0.0 0.0 - 0.2 %  ECHOCARDIOGRAM COMPLETE  Result Value Ref Range   Weight 1,961.21 oz   Height 66 in   BP 146/48 mmHg   Ao pk vel 1.48 m/s   AR max vel 1.97 cm2   AV Peak grad 8.8 mmHg   Single Plane A2C EF 44.1 %   Single Plane A4C EF 52.1 %   Calc EF  48.1 %   S' Lateral 3.20 cm   Area-P 1/2 4.24 cm2  Troponin I (High Sensitivity)  Result Value Ref Range   Troponin I  (High Sensitivity) 7 <18 ng/L  Troponin I (High Sensitivity)  Result Value Ref Range   Troponin I (High Sensitivity) 9 <18 ng/L      Assessment & Plan:   1. Post-herpetic polyneuropathy -continue using topical lidocaine and ice for pain relief - gabapentin (NEURONTIN) 300 MG capsule; Take 1 capsule (300 mg total) by mouth 3 (three) times daily.  Dispense: 90 capsule; Refill: 0   2. Chronic a-fib ([place]) -keep cardiology appointment tomorrow  Follow up plan:  As needed

## 2021-08-03 ENCOUNTER — Encounter: Payer: Self-pay | Admitting: Physician Assistant

## 2021-08-03 ENCOUNTER — Ambulatory Visit: Payer: Medicare Other | Admitting: Physician Assistant

## 2021-08-03 VITALS — BP 136/59 | HR 74 | Ht 66.0 in | Wt 123.0 lb

## 2021-08-03 DIAGNOSIS — I480 Paroxysmal atrial fibrillation: Secondary | ICD-10-CM

## 2021-08-03 DIAGNOSIS — I340 Nonrheumatic mitral (valve) insufficiency: Secondary | ICD-10-CM

## 2021-08-03 DIAGNOSIS — I5042 Chronic combined systolic (congestive) and diastolic (congestive) heart failure: Secondary | ICD-10-CM

## 2021-08-03 DIAGNOSIS — I10 Essential (primary) hypertension: Secondary | ICD-10-CM

## 2021-08-03 DIAGNOSIS — I440 Atrioventricular block, first degree: Secondary | ICD-10-CM | POA: Diagnosis not present

## 2021-08-03 DIAGNOSIS — I447 Left bundle-branch block, unspecified: Secondary | ICD-10-CM | POA: Diagnosis not present

## 2021-08-03 DIAGNOSIS — R55 Syncope and collapse: Secondary | ICD-10-CM

## 2021-08-03 DIAGNOSIS — E782 Mixed hyperlipidemia: Secondary | ICD-10-CM

## 2021-08-03 MED ORDER — AMIODARONE HCL 200 MG PO TABS: 200.0000 mg | ORAL_TABLET | Freq: Every day | ORAL | 3 refills | Status: DC

## 2021-08-03 NOTE — Patient Instructions (Signed)
Medication Instructions:   RESTART your Amiodarone at 200 MG once a day.  *If you need a refill on your cardiac medications before your next appointment, please call your pharmacy*   Lab Work: None ordered If you have labs (blood work) drawn today and your tests are completely normal, you will receive your results only by: [place] (if you have MyChart) OR A paper copy in the mail If you have any lab test that is abnormal or we need to change your treatment, we will call you to review the results.   Testing/Procedures: None ordered   Follow-Up: At [facility], you and your health needs are our priority.  As part of our continuing mission to provide you with exceptional heart care, we have created designated Provider Care Teams.  These Care Teams include your primary Cardiologist (physician) and Advanced Practice Providers (APPs -  Physician Assistants and Nurse Practitioners) who all work together to provide you with the care you need, when you need it.  We recommend signing up for the patient portal called "MyChart".  Sign up information is provided on this After Visit Summary.  MyChart is used to connect with patients for Virtual Visits (Telemedicine).  Patients are able to view lab/test results, encounter notes, upcoming appointments, etc.  Non-urgent messages can be sent to your provider as well.   To learn more about what you can do with MyChart, go to [email or web address].    Your next appointment:   2 month(s)  The format for your next appointment:   In Person  Provider:   You may see Ida Rogue, MD or one of the following Advanced Practice Providers on your designated Care Team:   Murray Hodgkins, NP Christell Faith, PA-C Marrianne Mood, PA-C Cadence Kathlen Mody, [place]   Other Instructions

## 2021-08-04 NOTE — Telephone Encounter (Signed)
Reviewed the patient's chart. She was seen in office on 08/03/21 with Christell Faith, PA.

## 2021-08-05 ENCOUNTER — Other Ambulatory Visit: Payer: Self-pay | Admitting: Cardiovascular Disease

## 2021-08-18 ENCOUNTER — Telehealth: Payer: Self-pay | Admitting: Cardiovascular Disease

## 2021-08-18 NOTE — Telephone Encounter (Signed)
   [facility] Pre-operative Risk Assessment    Patient Name: [name]  DOB: 02-25-1941 MRN: [id number]  HEARTCARE STAFF:  - IMPORTANT!!!!!! Under Visit Info/Reason for Call, type in Other and utilize the format Clearance MM/DD/YY or Clearance TBD. Do not use dashes or single digits. - Please review there is not already an duplicate clearance open for this procedure. - If request is for dental extraction, please clarify the # of teeth to be extracted. - If the patient is currently at the dentist's office, call Pre-Op Callback Staff (MA/nurse) to input urgent request.  - If the patient is not currently in the dentist office, please route to the Pre-Op pool.  Request for surgical clearance:  What type of surgery is being performed? Spinal Injection  When is this surgery scheduled? TBD  What type of clearance is required (medical clearance vs. Pharmacy clearance to hold med vs. Both)? Both  Are there any medications that need to be held prior to surgery and how long? Eliquis for 3 days  Practice name and name of physician performing surgery? Dr. Brien Few, [facility]  What is the office phone number? [phone]   7.   What is the office fax number? [phone]  8.   Anesthesia type (None, local, MAC, general) ? Not listed   Pilar A Ham 08/18/2021, 3:38 PM  _________________________________________________________________   (provider comments below)

## 2021-08-18 NOTE — Telephone Encounter (Signed)
   [facility] Pre-operative Risk Assessment    Patient Name: [name]  DOB: 26-Apr-1941 MRN: [id number]  HEARTCARE STAFF:  - IMPORTANT!!!!!! Under Visit Info/Reason for Call, type in Other and utilize the format Clearance MM/DD/YY or Clearance TBD. Do not use dashes or single digits. - Please review there is not already an duplicate clearance open for this procedure. - If request is for dental extraction, please clarify the # of teeth to be extracted. - If the patient is currently at the dentist's office, call Pre-Op Callback Staff (MA/nurse) to input urgent request.  - If the patient is not currently in the dentist office, please route to the Pre-Op pool.  Request for surgical clearance:  What type of surgery is being performed? Spinal Injection  When is this surgery scheduled? TBD  What type of clearance is required (medical clearance vs. Pharmacy clearance to hold med vs. Both)? Both  Are there any medications that need to be held prior to surgery and how long? No listed  Practice name and name of physician performing surgery? Dr. Brien Few, [facility]  What is the office phone number? No listed   7.   What is the office fax number? [phone]  8.   Anesthesia type (None, local, MAC, general) ? Not listed   Pilar A Ham 08/18/2021, 3:30 PM  _________________________________________________________________   (provider comments below)

## 2021-08-19 NOTE — Telephone Encounter (Signed)
Patient with diagnosis of A Fib on Eliquis for anticoagulation.    Procedure: Spinal Injection Date of procedure: TBD   CHA2DS2-VASc Score = 6  This indicates a 9.7% annual risk of stroke. The patient's score is based upon: CHF History: 1 HTN History: 1 Diabetes History: 0 Stroke History: 0 Vascular Disease History: 1 Age Score: 2 Gender Score: 1    CrCl 58 mL/min  Platelet count 158K   Per office protocol, patient can hold Eliquis for 3 days prior to procedure.

## 2021-08-19 NOTE — Telephone Encounter (Signed)
   Name: [name]  DOB: 15-Jul-1941  MRN: [id number]   Primary Cardiologist: Ida Rogue, MD  Chart reviewed as part of pre-operative protocol coverage. Patient was contacted 08/19/2021 in reference to pre-operative risk assessment for pending surgery as outlined below.  [name] was last seen on 08/03/21 by Christell Faith, PA-C.  Since that day, [name] has done well from a cardiac standpoint.  Despite her back pain she is still able to complete 4 METS without anginal complaints.  Therefore, based on ACC/AHA guidelines, the patient would be at acceptable risk for the planned procedure without further cardiovascular testing.   The patient was advised that if she develops new symptoms prior to surgery to contact our office to arrange for a follow-up visit, and she verbalized understanding.  Per pharmacy recommendation, patient can hold Eliquis 3 days prior to her upcoming spinal injection with plans to restart as soon as she is cleared to do so by her surgeon.  I will route this recommendation to the requesting party via Epic fax function and remove from pre-op pool. Please call with questions.  Abigail Butts, PA-C 08/19/2021, 2:22 PM

## 2021-08-23 ENCOUNTER — Telehealth: Payer: Self-pay

## 2021-08-23 NOTE — Progress Notes (Signed)
    Chronic Care Management Pharmacy Assistant   Name: DEMARIS BOUSQUET  MRN: [id number] DOB: 06-17-1941  Patient called to be reminded of her telephone appointment with Junius Argyle, CPP @ 1000  Patient aware of appointment date, time, and type of appointment (either telephone or in person). Patient aware to have/bring all medications, supplements, blood pressure and/or blood sugar logs to visit.  Questions: Are there any concerns you would like to discuss during your office visit? Nothing at this time  Are you having any problems obtaining your medications? Not at this time  Star Rating Drug: Rosuvastatin 40 mg last filled on 06/30/2021 for a 90-Day supply with [facility]  Any gaps in medications fill history? No  Care Gaps: COVID-19 Booster Winfred, CPA/CMA Clinical Pharmacist Assistant Phone: [phone]

## 2021-08-24 ENCOUNTER — Ambulatory Visit (INDEPENDENT_AMBULATORY_CARE_PROVIDER_SITE_OTHER): Payer: Medicare Other

## 2021-08-24 DIAGNOSIS — I5032 Chronic diastolic (congestive) heart failure: Secondary | ICD-10-CM

## 2021-08-24 DIAGNOSIS — I480 Paroxysmal atrial fibrillation: Secondary | ICD-10-CM

## 2021-08-24 NOTE — Progress Notes (Signed)
Chronic Care Management Pharmacy Note  09/09/2021 Name:  [name] MRN:  [id number] DOB:  1940-11-10  Summary: Patient presents for CCM follow-up.  Recommendations/Changes made from today's visit: Continue current medications  Plan: CPP follow-up 6 months   Subjective: [name] is an 80 y.o. year old female who is a primary patient of Steele Sizer, MD.  The CCM team was consulted for assistance with disease management and care coordination needs.    Engaged with patient by telephone for follow up visit in response to provider referral for pharmacy case management and/or care coordination services.   Consent to Services:  The patient was given information about Chronic Care Management services, agreed to services, and gave verbal consent prior to initiation of services.  Please see initial visit note for detailed documentation.   Patient Care Team: Steele Sizer, MD as PCP - General (Family Medicine) Minna Merritts, MD as PCP - Cardiology (Cardiology) Dingeldein, Remo Lipps, MD as Consulting Physician (Ophthalmology) Alisa Graff, FNP as Consulting Physician (Family Medicine) Jannet Mantis, MD (Dermatology) Minna Merritts, MD as Consulting Physician (Cardiology) Ashok Pall, MD as Consulting Physician (Neurosurgery) Marlaine Hind, MD as Consulting Physician (Physical Medicine and Rehabilitation) Billey Co, MD as Consulting Physician (Urology)  Recent office visits: 08/02/21: Patient presented to Serafina Royals, FNP for post-herpetic neuralgia. Gabapentin increased to 300 mg TID  12/01/20: Patient presented to Dr. Ancil Boozer for hospital follow-up.  11/18/20: Patient presented to Dr. Ancil Boozer for cough. No medication changes made.   09/13/20: Patient presented to Dr. Ancil Boozer for follow-up. TSH 5.09. Magic mouthwash discontinued.   Recent consult visits:  08/03/21: Patient presented to Christell Faith, PA-C (Cardiology) for hospital follow-up. Amiodarone  200 mg daily.  11/05/20: Patient presented to Dr. Rockey Situ (Cardiology) for follow-up. Valsartan 160 mg daily started. Doxazosin decreased to 1 mg nightly.   Hospital visits: 10/1-10/3: Patient hospitalized for orthostatic hypotension/ syncope. Amiodarone on hold.  1/28-11/24/20: Patient hospitalized for CAP. Patient discharged on levofloxacin x 2 days.  1/8-1/11/22: Patient hospitalized for CAP. Patient discharged on prednisone x2 additional days + levofloxacin x 2 days.    Objective:  Lab Results  Component Value Date   CREATININE 0.69 07/24/2021   BUN 11 07/24/2021   GFRNONAA >60 07/24/2021   GFRAA 72 12/01/2020   NA 137 07/24/2021   K 3.7 07/24/2021   CALCIUM 8.9 07/24/2021   CO2 28 07/24/2021   GLUCOSE 114 (H) 07/24/2021    Lab Results  Component Value Date/Time   HGBA1C 5.2 09/13/2020 12:00 AM   HGBA1C 5.0 03/30/2020 04:12 AM    Last diabetic Eye exam: No results found for: HMDIABEYEEXA  Last diabetic Foot exam: No results found for: HMDIABFOOTEX   Lab Results  Component Value Date   CHOL 134 09/13/2020   HDL 56 09/13/2020   LDLCALC 53 09/13/2020   TRIG 54 10/30/2020   CHOLHDL 2.4 09/13/2020    Hepatic Function Latest Ref Rng & Units 11/21/2020 11/19/2020 11/18/2020  Total Protein 6.5 - 8.1 g/dL 6.1(L) 7.5 7.4  Albumin 3.5 - 5.0 g/dL 2.6(L) 3.0(L) 3.3(L)  AST 15 - 41 U/L 26 38 25  ALT 0 - 44 U/L 21 29 18   Alk Phosphatase 38 - 126 U/L 64 86 80  Total Bilirubin 0.3 - 1.2 mg/dL 0.5 0.7 0.6  Bilirubin, Direct 0.0 - 0.2 mg/dL - - -    Lab Results  Component Value Date/Time   TSH 1.583 07/23/2021 12:30 PM   TSH 0.852 10/30/2020 04:03  AM   TSH 5.09 (H) 09/13/2020 12:00 AM   TSH 1.20 06/02/2019 08:36 AM   FREET4 1.2 01/10/2017 08:25 AM   FREET4 1.51 07/22/2015 09:09 AM    CBC Latest Ref Rng & Units 07/24/2021 07/23/2021 12/01/2020  WBC 4.0 - 10.5 K/uL 6.4 5.9 9.6  Hemoglobin 12.0 - 15.0 g/dL 13.0 11.9(L) 11.2(L)  Hematocrit 36.0 - 46.0 % 37.8 34.9(L) 34.3(L)   Platelets 150 - 400 K/uL 158 153 312    Lab Results  Component Value Date/Time   VD25OH 46 05/17/2017 11:43 AM    Clinical ASCVD: Yes  The 10-year ASCVD risk score (Arnett DK, et al., 2019) is: 35.2%   Values used to calculate the score:     Age: 79 years     Sex: Female     Is Non-Hispanic African American: No     Diabetic: No     Tobacco smoker: No     Systolic Blood Pressure: 272 mmHg     Is BP treated: Yes     HDL Cholesterol: 56 mg/dL     Total Cholesterol: 134 mg/dL    Depression screen [facility] 2/9 08/02/2021 05/26/2021 03/14/2021  Decreased Interest 0 0 0  Down, Depressed, Hopeless 0 0 0  PHQ - 2 Score 0 0 0  Altered sleeping - - -  Tired, decreased energy - - -  Change in appetite - - -  Feeling bad or failure about yourself  - - -  Trouble concentrating - - -  Moving slowly or fidgety/restless - - -  Suicidal thoughts - - -  PHQ-9 Score - - -  Difficult doing work/chores - - -  Some recent data might be hidden     Social History   Tobacco Use  Smoking Status Never  Smokeless Tobacco Never  Tobacco Comments   smoking cessation materials not required   BP Readings from Last 3 Encounters:  08/03/21 (!) 136/59  08/02/21 124/64  07/25/21 (!) 151/48   Pulse Readings from Last 3 Encounters:  08/03/21 74  08/02/21 97  07/25/21 72   Wt Readings from Last 3 Encounters:  08/03/21 123 lb (55.8 kg)  08/02/21 122 lb 6.4 oz (55.5 kg)  07/23/21 122 lb 9.2 oz (55.6 kg)   BMI Readings from Last 3 Encounters:  08/03/21 19.85 kg/m  08/02/21 19.76 kg/m  07/23/21 19.78 kg/m    Assessment/Interventions: Review of patient past medical history, allergies, medications, health status, including review of consultants reports, laboratory and other test data, was performed as part of comprehensive evaluation and provision of chronic care management services.   SDOH:  (Social Determinants of Health) assessments and interventions performed: Yes SDOH Interventions     Flowsheet Row Most Recent Value  SDOH Interventions   Financial Strain Interventions Intervention Not Indicated       SDOH Screenings   Alcohol Screen: Low Risk    Last Alcohol Screening Score (AUDIT): 0  Depression (PHQ2-9): Low Risk    PHQ-2 Score: 0  Financial Resource Strain: Low Risk    Difficulty of Paying Living Expenses: Not hard at all  Food Insecurity: No Food Insecurity   Worried About Charity fundraiser in the Last Year: Never true   Ran Out of Food in the Last Year: Never true  Housing: Low Risk    Last Housing Risk Score: 0  Physical Activity: Inactive   Days of Exercise per Week: 0 days   Minutes of Exercise per Session: 0 min  Social Connections:  Moderately Integrated   Frequency of Communication with Friends and Family: More than three times a week   Frequency of Social Gatherings with Friends and Family: More than three times a week   Attends Religious Services: More than 4 times per year   Active Member of [employer] or Organizations: Yes   Attends Archivist Meetings: More than 4 times per year   Marital Status: Widowed  Stress: No Stress Concern Present   Feeling of Stress : Only a little  Tobacco Use: Low Risk    Smoking Tobacco Use: Never   Smokeless Tobacco Use: Never   Passive Exposure: Not on file  Transportation Needs: No Transportation Needs   Lack of Transportation (Medical): No   Lack of Transportation (Non-Medical): No    CCM Care Plan  Allergies  Allergen Reactions   Chocolate Swelling   Penicillins Swelling and Rash    Has patient had a PCN reaction causing immediate rash, facial/tongue/throat swelling, SOB or lightheadedness with hypotension: Yes Has patient had a PCN reaction causing severe rash involving mucus membranes or skin necrosis: No Has patient had a PCN reaction that required hospitalization: No Has patient had a PCN reaction occurring within the last 10 years: No If all of the above answers are "NO", then may  proceed with Cephalosporin use.    Codeine Nausea And Vomiting   Erythromycin Itching and Swelling   Septra [Sulfamethoxazole-Trimethoprim] Swelling    Medications Reviewed Today     Reviewed by Sindy Messing (Physician Assistant Certified) on 98/33/82 at [place] List Status: <None>   Medication Order Taking? Sig Documenting Provider Last Dose Status Informant  acetaminophen (TYLENOL) 650 MG CR tablet [id number] Yes Take 650-1,300 mg by mouth every 8 (eight) hours as needed for pain.  [provider] Taking Active Spouse/Significant Other  amLODipine (NORVASC) 2.5 MG tablet [id number] Yes Take 1 tablet (2.5 mg total) by mouth daily. Debbe Odea, MD Taking Active   apixaban (ELIQUIS) 5 MG TABS tablet [id number] Yes TAKE 1 TABLET(5 MG) BY MOUTH TWICE DAILY Gollan, Kathlene November, MD Taking Active Spouse/Significant Other  busPIRone (BUSPAR) 5 MG tablet [id number] Yes TAKE 1 TABLET(5 MG) BY MOUTH TWICE DAILY Sowles, Drue Stager, MD Taking Active Spouse/Significant Other  cholecalciferol (VITAMIN D) 1000 UNITS tablet [id number] Yes Take 2,000 Units by mouth daily. [provider] Taking Active Spouse/Significant Other  Cranberry 500 MG TABS [id number] Yes Take 2 tablets by mouth daily. [provider] Taking Active Spouse/Significant Other  doxazosin (CARDURA) 1 MG tablet [id number] Yes Take 1 tablet (1 mg total) by mouth every evening. Minna Merritts, MD Taking Active Spouse/Significant Other  furosemide (LASIX) 20 MG tablet [id number] Yes Take 20 mg by mouth. [provider] Taking Active Self  gabapentin (NEURONTIN) 300 MG capsule [id number] Yes Take 1 capsule (300 mg total) by mouth 3 (three) times daily. Bo Merino, FNP Taking Active   levothyroxine (SYNTHROID) 137 MCG tablet [id number] Yes Take 1 tablet (137 mcg total) by mouth daily before breakfast. Steele Sizer, MD Taking Active Spouse/Significant Other  loratadine (CLARITIN) 10 MG tablet [id number] Yes  Take 10 mg by mouth daily. [provider] Taking Active Spouse/Significant Other  Multiple Vitamin (MULITIVITAMIN WITH MINERALS) TABS [id number] Yes Take 1 tablet by mouth daily. [provider] Taking Active Spouse/Significant Other  potassium chloride (KLOR-CON) 10 MEQ tablet [id number] Yes TAKE 1 TABLET BY MOUTH EVERY DAY AS DIRECTED. MAY TAKE AN EXTRA PILL AFTER LUNCH WITH FLUID PILL AS NEEDED FOR  SWELLING Gollan, Kathlene November, MD Taking Active Spouse/Significant Other  rosuvastatin (CRESTOR) 40 MG tablet [id number] Yes TAKE 1 TABLET(40 MG) BY MOUTH DAILY Ancil Boozer, Drue Stager, MD Taking Active Spouse/Significant Other  temazepam (RESTORIL) 15 MG capsule [id number] Yes TAKE 1 CAPSULE(15 MG) BY MOUTH AT BEDTIME AS NEEDED FOR SLEEP Steele Sizer, MD Taking Active Spouse/Significant Other            Patient Active Problem List   Diagnosis Date Noted   Orthostatic hypotension 07/24/2021   Bradycardia 07/24/2021   History of pyelonephritis    Acquired thrombophilia ([place])    History of pneumonia 10/2020   Hydronephrosis of right kidney 03/29/2020   Depression 03/29/2020   Chronic diastolic CHF (congestive heart failure) ([place]) 05/24/2018   AF (paroxysmal atrial fibrillation) ([place]) 05/24/2018   CKD (chronic kidney disease), stage III ([place]) 05/17/2018   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   Pulmonary hypertension, unspecified ([place]) 04/05/2018   Paroxysmal sinus tachycardia ([place]) 01/29/2018   Leg pain 01/04/2018   PAD (peripheral artery disease) ([place]) 01/04/2018   Carotid artery calcification, bilateral 12/19/2017   Cervical radiculitis 12/19/2017   Hyperglycemia 03/06/2016   Hypothyroid 04/21/2015   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   Anxiety 04/21/2015   Hyperlipidemia 04/21/2015   Diuretic-induced hypokalemia 04/21/2015   Palpitations 12/18/2012   Hypertension 05/27/2012    Immunization History  Administered Date(s) Administered   Fluad  Quad(high Dose 65+) 08/11/2019   Influenza, High Dose Seasonal PF 07/22/2015, 07/04/2018   Influenza-Unspecified 08/25/2016, 08/13/2017, 08/13/2020   PFIZER(Purple Top)SARS-COV-2 Vaccination 11/13/2019, 12/04/2019, 08/13/2020   Pneumococcal Conjugate-13 09/07/2016   Pneumococcal Polysaccharide-23 12/04/2017    Conditions to be addressed/monitored:  Hypertension, Hyperlipidemia, Atrial Fibrillation, Heart Failure, Coronary Artery Disease, Chronic Kidney Disease, Hypothyroidism, Depression, Anxiety and Peripheral Artery Disease  Care Plan : General Pharmacy (Adult)  Updates made by Germaine Pomfret, RPH since 09/09/2021 12:00 AM     Problem: Hypertension, Hyperlipidemia, Atrial Fibrillation, Heart Failure, Coronary Artery Disease, Chronic Kidney Disease, Hypothyroidism, Depression, Anxiety and Peripheral Artery Disease   Priority: High     Long-Range Goal: Patient-Specific Goal   Start Date: 03/10/2021  Expected End Date: 09/09/2022  This Visit's Progress: On track  Recent Progress: On track  Priority: High  Note:   Current Barriers:  No barriers noted  Pharmacist Clinical Goal(s):  Patient will maintain control of blood pressure as evidenced by BP less than 140/90  through collaboration with PharmD and provider.   Interventions: 1:1 collaboration with Steele Sizer, MD regarding development and update of comprehensive plan of care as evidenced by provider attestation and co-signature Inter-disciplinary care team collaboration (see longitudinal plan of care) Comprehensive medication review performed; medication list updated in electronic medical record  Heart Failure (Goal: manage symptoms and prevent exacerbations) -Controlled -Last ejection fraction: 60-65% (Date: 10/31/2020) -HF type: Diastolic -NYHA Class: II (slight limitation of activity) -AHA HF Stage: C (Heart disease and symptoms present) -Current treatment: Amlodipine 2.5 mg daily  Doxazosin 1 mg nightly   Furosemide 20 mg daily  -Medications previously tried: NA  -Current home BP/HR readings: 130s-140s/60s; pulse typically 50s-60s  -Educated on Importance of blood pressure control -Recommended to continue current medication  Atrial Fibrillation (Goal: prevent stroke and major bleeding) -Controlled -CHADSVASC: 6 -Current treatment: Rate/Rhthym control:  Amiodarone 200 mg daily  Anticoagulation: Eliquis 5 mg twice daily  -Medications previously tried: NA -Patient reports she has had a few instances of A-Fib, often associated with increased activity.  -Counseled on avoidance of NSAIDs due to  increased bleeding risk with anticoagulants; -Recommended to continue current medication  Hyperlipidemia: (LDL goal < 70) -Controlled -Current treatment: Rosuvastatin 40 mg daily  -Medications previously tried: NA  -Educated on Importance of limiting foods high in cholesterol; -Recommended to continue current medication  Depression/Anxiety (Goal: Maintain stable mood) -Controlled -Current treatment: Buspirone 5 mg twice daily   -Medications previously tried/failed: NA -PHQ9: 0 -GAD7: 0  -Educated on Benefits of medication for symptom control -Recommended to continue current medication  Insomnia (Goal: Improve sleep quality) -Controlled -Current treatment  Melatonin 15 nightly (twice weekly)  Temazepam -Medications previously tried: NA  -Patient reports her sleep quality as very good. Does need to take temazepam every other night, but denies any lingering sedation or side effects from treatment. -Recommended to continue current medication   Hypothyroidism (Goal: Maintain stable thyroid functon) -Controlled -Current treatment  Levothyroxine 137 mcg daily  -Medications previously tried: NA -Patient reports concerns with hair loss, wonders if it could be an iron deficiency   separates from other medications and food  -Recommended to continue current medication Will plan to have  PCP recheck thyroid and iron function at next follow-up.  Could consider biotin or B complex supplementation.   Patient Goals/Self-Care Activities Patient will:  - check blood pressure 2-3 times weekly, document, and provide at future appointments  Follow Up Plan: Telephone follow up appointment with care management team member scheduled for:  11/23/2021 at 10:15 AM       Medication Assistance: None required.  Patient affirms current coverage meets needs.  Patient's preferred pharmacy is:  [id number] [address], [place] AT [facility] OF [address] [place] [address] Phone: [phone] Fax: [phone]  Uses pill box? Yes Pt endorses 100% compliance  We discussed: Current pharmacy is preferred with insurance plan and patient is satisfied with pharmacy services Patient decided to: Continue current medication management strategy  Care Plan and Follow Up Patient Decision:  Patient agrees to Care Plan and Follow-up.  Plan: Telephone follow up appointment with care management team member scheduled for:  11/23/2021 at 10:15 AM  Malva Limes, [place] Pharmacist Practitioner  [facility] [phone]

## 2021-08-25 ENCOUNTER — Other Ambulatory Visit: Payer: Self-pay | Admitting: Cardiovascular Disease

## 2021-09-06 DIAGNOSIS — M47816 Spondylosis without myelopathy or radiculopathy, lumbar region: Secondary | ICD-10-CM | POA: Diagnosis not present

## 2021-09-09 NOTE — Patient Instructions (Signed)
Visit Information It was great speaking with you today!  Please let me know if you have any questions about our visit.   Goals Addressed             This Visit's Progress    Track and Manage Fluids and Swelling-Heart Failure   On track    Timeframe:  Long-Range Goal Priority:  High Start Date:  03/10/2021                            Expected End Date: 09/10/2021                       Follow Up within 90 days    - call office if I gain more than 2 pounds in one day or 5 pounds in one week - keep legs up while sitting - track weight in diary - use salt in moderation - watch for swelling in feet, ankles and legs every day    Why is this important?   It is important to check your weight daily and watch how much salt and liquids you have.  It will help you to manage your heart failure.    Notes:         Patient Care Plan: General Pharmacy (Adult)     Problem Identified: Hypertension, Hyperlipidemia, Atrial Fibrillation, Heart Failure, Coronary Artery Disease, Chronic Kidney Disease, Hypothyroidism, Depression, Anxiety and Peripheral Artery Disease   Priority: High     Long-Range Goal: Patient-Specific Goal   Start Date: 03/10/2021  Expected End Date: 09/09/2022  This Visit's Progress: On track  Recent Progress: On track  Priority: High  Note:   Current Barriers:  No barriers noted  Pharmacist Clinical Goal(s):  Patient will maintain control of blood pressure as evidenced by BP less than 140/90  through collaboration with PharmD and provider.   Interventions: 1:1 collaboration with Steele Sizer, MD regarding development and update of comprehensive plan of care as evidenced by provider attestation and co-signature Inter-disciplinary care team collaboration (see longitudinal plan of care) Comprehensive medication review performed; medication list updated in electronic medical record  Heart Failure (Goal: manage symptoms and prevent exacerbations) -Controlled -Last  ejection fraction: 60-65% (Date: 10/31/2020) -HF type: Diastolic -NYHA Class: II (slight limitation of activity) -AHA HF Stage: C (Heart disease and symptoms present) -Current treatment: Amlodipine 2.5 mg daily  Doxazosin 1 mg nightly  Furosemide 20 mg daily  -Medications previously tried: NA  -Current home BP/HR readings: 130s-140s/60s; pulse typically 50s-60s  -Educated on Importance of blood pressure control -Recommended to continue current medication  Atrial Fibrillation (Goal: prevent stroke and major bleeding) -Controlled -CHADSVASC: 6 -Current treatment: Rate/Rhthym control:  Amiodarone 200 mg daily  Anticoagulation: Eliquis 5 mg twice daily  -Medications previously tried: NA -Patient reports she has had a few instances of A-Fib, often associated with increased activity.  -Counseled on avoidance of NSAIDs due to increased bleeding risk with anticoagulants; -Recommended to continue current medication  Hyperlipidemia: (LDL goal < 70) -Controlled -Current treatment: Rosuvastatin 40 mg daily  -Medications previously tried: NA  -Educated on Importance of limiting foods high in cholesterol; -Recommended to continue current medication  Depression/Anxiety (Goal: Maintain stable mood) -Controlled -Current treatment: Buspirone 5 mg twice daily   -Medications previously tried/failed: NA -PHQ9: 0 -GAD7: 0  -Educated on Benefits of medication for symptom control -Recommended to continue current medication  Insomnia (Goal: Improve sleep quality) -Controlled -Current treatment  Melatonin 15  nightly (twice weekly)  Temazepam -Medications previously tried: NA  -Patient reports her sleep quality as very good. Does need to take temazepam every other night, but denies any lingering sedation or side effects from treatment. -Recommended to continue current medication   Hypothyroidism (Goal: Maintain stable thyroid functon) -Controlled -Current treatment  Levothyroxine 137 mcg  daily  -Medications previously tried: NA -Patient reports concerns with hair loss, wonders if it could be an iron deficiency   separates from other medications and food  -Recommended to continue current medication Will plan to have PCP recheck thyroid and iron function at next follow-up.  Could consider biotin or B complex supplementation.   Patient Goals/Self-Care Activities Patient will:  - check blood pressure 2-3 times weekly, document, and provide at future appointments  Follow Up Plan: Telephone follow up appointment with care management team member scheduled for:  11/23/2021 at 10:15 AM      Patient agreed to services and verbal consent obtained.   Patient verbalizes understanding of instructions provided today and agrees to view in [place].   Malva Limes, [place] Pharmacist Practitioner  [facility] [phone]

## 2021-09-13 NOTE — Progress Notes (Signed)
Name: [name]   MRN: [id number]    DOB: 02/16/1941   Date:09/14/2021       Progress Note  Subjective  Chief Complaint  Follow up   HPI  GAD: doing well on Buspar , she takes every am and occasionally in the afternoon , she has been taking for many years and no side effects , she also takes temazepam qhs prn and it helps her sleep when her mind is busy She denies a history of panic attacks.    S/p neck fusion: it was done by Dr. Larose Hires April 2021 and was  doing well, symptoms started to bother her again earlier this year had MRI c-spine and lumbar spine, is now seeing Dr. Brien Few and had injections on her neck and back, initially did not noticed improvement but recently had a nerve ablation on the lower back and has noticed improvement of the pain.    Carotid calcification: found on x-ray of neck. She also had a vascular study done back in 2011 that showed mild plaque formation on right. She is now on Crestor and  Eliquis. Off aspirin since ablation 04/2018. She denies headache or dizziness or double vision, she still has intermittent  balance problems but improved since cataract surgery.  Dr. Larose Hires thinks balanced disturbance is likely from neck surgery. She went back to Dr. Erven Colla and had ABI that was normal, carotid doppler was due in 2021, but Dr. Rockey Situ told her not to worry about doing it at this time. She also has a history of pulmonary hypertension - under the care of Dr. Rockey Situ  History of SVT but not currently having problems, advised to monitor since off Metoprolol    HTN: she had syncopal episode due to hypotension back in early October, they adjusted bp medications and is not having problems at this time. Now bp is at goal, no dizziness or syncopal episodes. She has a follow up with Cardiologist soon. She is off Metoprolol and valsartan since October and is on lower dose norvasc    Afib : she had a cardioversion in June 2019  and another one in July 2019 , she has intermittent  palpitation  but not episode lately.    CHF chronic: she is down to one pillow at night. She has mild sob with moderate activity , but able to walk without problems lately,   no chest pain, she has intermittent lower extremity edema and is taking extra lasix and potassium at lunch prn as instructed by Dr. Rockey Situ . On ARB , beta blocker and Eliquis. No side effects    Senile Purpura: both arms and legs, on Eliquis. Reassurance given   Skin cancer: seeing dermatologist - Dr. Phillip Heal and having laser treatment for superficial basal cell carcinoma Next follow up is in January   Hypothyroidism: doing well on medication,  denies constipation or difficulty swallowing,  She has noticed some hair thinning but better with Biotin.   Last TSH in October was at  goal   Recurrent UTI:no recent episodes, she sees Urologist once a year   CKI : stage III, her GFR has improved but was in the upper 50's three times earlier this year, she is currently on Norvasc 2.5 mg , she has been off valsartan due to hypotensive episode, we will recheck labs today   Patient Active Problem List   Diagnosis Date Noted   Orthostatic hypotension 07/24/2021   Bradycardia 07/24/2021   History of pyelonephritis    Acquired thrombophilia ([place])  History of pneumonia 10/2020   Hydronephrosis of right kidney 03/29/2020   Depression 03/29/2020   Chronic diastolic CHF (congestive heart failure) ([place]) 05/24/2018   AF (paroxysmal atrial fibrillation) (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   Pulmonary hypertension, unspecified ([place]) 04/05/2018   Paroxysmal sinus tachycardia ([place]) 01/29/2018   Leg pain 01/04/2018   PAD (peripheral artery disease) ([place]) 01/04/2018   Carotid artery calcification, bilateral 12/19/2017   Cervical radiculitis 12/19/2017   Hyperglycemia 03/06/2016   Hypothyroid 04/21/2015   DJD (degenerative joint disease) of cervical spine  04/21/2015   Anxiety 04/21/2015   Hyperlipidemia 04/21/2015   Diuretic-induced hypokalemia 04/21/2015   Palpitations 12/18/2012   Hypertension 05/27/2012    Past Surgical History:  Procedure Laterality Date   BACK SURGERY  07/2019   CARDIOVERSION N/A 03/26/2018   Procedure: CARDIOVERSION;  Surgeon: Nelva Bush, MD;  Location: [facility];  Service: Cardiovascular;  Laterality: N/A;   CARDIOVERSION N/A 04/24/2018   Procedure: CARDIOVERSION;  Surgeon: Minna Merritts, MD;  Location: [facility];  Service: Cardiovascular;  Laterality: N/A;   CATARACT EXTRACTION W/PHACO Right 07/12/2021   Procedure: CATARACT EXTRACTION PHACO AND INTRAOCULAR LENS PLACEMENT (IOC) RIGHT 16.92 01:30.8 ;  Surgeon: Birder Robson, MD;  Location: [place];  Service: Ophthalmology;  Laterality: Right;   ELECTROPHYSIOLOGY STUDY N/A 02/27/2012   Procedure: ELECTROPHYSIOLOGY STUDY;  Surgeon: Evans Lance, MD;  Location[facility] LAB;  Service: Cardiovascular;  Laterality: N/A;   EPS and ablation for SVT  5/13   slow pathway ablation by Dr Lovena Le   EYE SURGERY     surgery for detatched retina   NECK SURGERY  02/2020   SUPRAVENTRICULAR TACHYCARDIA ABLATION N/A 02/27/2012   Procedure: SUPRAVENTRICULAR TACHYCARDIA ABLATION;  Surgeon: Evans Lance, MD;  Location: [facility] CATH LAB;  Service: Cardiovascular;  Laterality: N/A;    Family History  Problem Relation Age of Onset   Alzheimer's disease Mother    Stroke Father    Breast cancer Father    Heart disease Father        Pacemaker   Breast cancer Paternal Aunt    Kidney cancer Maternal Grandmother    Alzheimer's disease Maternal Grandfather    Thyroid disease Paternal Grandmother    Stroke Paternal Grandfather     Social History   Tobacco Use   Smoking status: Never   Smokeless tobacco: Never   Tobacco comments:    smoking cessation materials not required  Substance Use Topics   Alcohol use: No     Current Outpatient Medications:     acetaminophen (TYLENOL) 650 MG CR tablet, Take 650-1,300 mg by mouth every 8 (eight) hours as needed for pain. , Disp: , Rfl:    amiodarone (PACERONE) 200 MG tablet, Take 1 tablet (200 mg total) by mouth daily., Disp: 90 tablet, Rfl: 3   amLODipine (NORVASC) 2.5 MG tablet, Take 1 tablet (2.5 mg total) by mouth daily., Disp: 30 tablet, Rfl: 0   BINAXNOW COVID-19 AG HOME TEST KIT, Use as Directed on the Package, Disp: , Rfl:    busPIRone (BUSPAR) 5 MG tablet, TAKE 1 TABLET(5 MG) BY MOUTH TWICE DAILY, Disp: 180 tablet, Rfl: 0   cholecalciferol (VITAMIN D) 1000 UNITS tablet, Take 2,000 Units by mouth daily., Disp: , Rfl:    Cranberry 500 MG TABS, Take 2 tablets by mouth daily., Disp: , Rfl:    doxazosin (CARDURA) 1 MG tablet, Take 1 tablet (1 mg  total) by mouth every evening., Disp: 90 tablet, Rfl: 3   ELIQUIS 5 MG TABS tablet, TAKE 1 TABLET(5 MG) BY MOUTH TWICE DAILY, Disp: 60 tablet, Rfl: 5   furosemide (LASIX) 20 MG tablet, Take 20 mg by mouth., Disp: , Rfl:    levothyroxine (SYNTHROID) 137 MCG tablet, Take 1 tablet (137 mcg total) by mouth daily before breakfast., Disp: 90 tablet, Rfl: 1   loratadine (CLARITIN) 10 MG tablet, Take 10 mg by mouth daily., Disp: , Rfl:    Melatonin 10 MG TABS, Take 10 mg by mouth at bedtime as needed., Disp: , Rfl:    Multiple Vitamin (MULITIVITAMIN WITH MINERALS) TABS, Take 1 tablet by mouth daily., Disp: , Rfl:    potassium chloride (KLOR-CON) 10 MEQ tablet, TAKE 1 TABLET BY MOUTH EVERY DAY AS DIRECTED. MAY TAKE AN EXTRA TABLET AFTER LUNCH WITH FLUID TABLET AS NEEDED FOR SWELLING, Disp: 180 tablet, Rfl: 0   rosuvastatin (CRESTOR) 40 MG tablet, TAKE 1 TABLET(40 MG) BY MOUTH DAILY, Disp: 90 tablet, Rfl: 3   temazepam (RESTORIL) 15 MG capsule, TAKE 1 CAPSULE(15 MG) BY MOUTH AT BEDTIME AS NEEDED FOR SLEEP, Disp: 30 capsule, Rfl: 0   valACYclovir (VALTREX) 1000 MG tablet, , Disp: , Rfl:    gabapentin (NEURONTIN) 300 MG capsule, Take 1 capsule (300 mg total) by mouth 3  (three) times daily. (Patient not taking: Reported on 09/14/2021), Disp: 90 capsule, Rfl: 0  Allergies  Allergen Reactions   Chocolate Swelling   Penicillins Swelling and Rash    Has patient had a PCN reaction causing immediate rash, facial/tongue/throat swelling, SOB or lightheadedness with hypotension: Yes Has patient had a PCN reaction causing severe rash involving mucus membranes or skin necrosis: No Has patient had a PCN reaction that required hospitalization: No Has patient had a PCN reaction occurring within the last 10 years: No If all of the above answers are "NO", then may proceed with Cephalosporin use.    Codeine Nausea And Vomiting   Erythromycin Itching and Swelling   Septra [Sulfamethoxazole-Trimethoprim] Swelling    I personally reviewed active problem list, medication list, allergies, family history, social history, health maintenance with the patient/caregiver today.   ROS  Constitutional: Negative for fever , positive for mild weight change.  Respiratory: Negative for cough and shortness of breath.   Cardiovascular: Negative for chest pain or palpitations.  Gastrointestinal: Negative for abdominal pain, no bowel changes.  Musculoskeletal: Negative for gait problem or joint swelling.  Skin: Negative for rash.  Neurological: Negative for dizziness or headache.  No other specific complaints in a complete review of systems (except as listed in HPI above).   Objective  Vitals:   09/14/21 0824  BP: 138/70  Pulse: 87  Resp: 16  Temp: 97.9 F (36.6 C)  SpO2: 99%  Weight: 117 lb (53.1 kg)  Height: 5' 6"  (1.676 m)    Body mass index is 18.88 kg/m.  Physical Exam  Constitutional: Patient appears well-developed and well-nourished. No distress.  HEENT: head atraumatic, normocephalic, pupils equal and reactive to light, neck supple Cardiovascular: Normal rate, regular rhythm and normal heart sounds.  No murmur heard. Trace BLE edema. Pulmonary/Chest: Effort  normal and breath sounds normal. No respiratory distress. Abdominal: Soft.  There is no tenderness. Psychiatric: Patient has a normal mood and affect. behavior is normal. Judgment and thought content normal.   Recent Results (from the past 2160 hour(s))  Brain natriuretic peptide     Status: Abnormal   Collection Time: 07/23/21  9:05 AM  Result Value Ref Range   B Natriuretic Peptide 145.5 (H) 0.0 - 100.0 pg/mL    Comment: Performed at [facility], [address] [place], [place] [address]  Resp Panel by RT-PCR (Flu A&B, Covid) Nasopharyngeal Swab     Status: None   Collection Time: 07/23/21  9:05 AM   Specimen: Nasopharyngeal Swab; Nasopharyngeal(NP) swabs in vial transport medium  Result Value Ref Range   SARS Coronavirus 2 by RT PCR NEGATIVE NEGATIVE    Comment: (NOTE) SARS-CoV-2 target nucleic acids are NOT DETECTED.  The SARS-CoV-2 RNA is generally detectable in upper respiratory specimens during the acute phase of infection. The lowest concentration of SARS-CoV-2 viral copies this assay can detect is 138 copies/mL. A negative result does not preclude SARS-Cov-2 infection and should not be used as the sole basis for treatment or other patient management decisions. A negative result may occur with  improper specimen collection/handling, submission of specimen other than nasopharyngeal swab, presence of viral mutation(s) within the areas targeted by this assay, and inadequate number of viral copies(<138 copies/mL). A negative result must be combined with clinical observations, patient history, and epidemiological information. The expected result is Negative.  Fact Sheet for Patients:  EntrepreneurPulse.com.au  Fact Sheet for Healthcare Providers:  IncredibleEmployment.be  This test is no t yet approved or cleared by the [place] FDA and  has been authorized for detection and/or diagnosis of SARS-CoV-2 by FDA under an  Emergency Use Authorization (EUA). This EUA will remain  in effect (meaning this test can be used) for the duration of the COVID-19 declaration under Section 564(b)(1) of the Act, 21 U.S.C.section 360bbb-3(b)(1), unless the authorization is terminated  or revoked sooner.       Influenza A by PCR NEGATIVE NEGATIVE   Influenza B by PCR NEGATIVE NEGATIVE    Comment: (NOTE) The Xpert Xpress SARS-CoV-2/FLU/RSV plus assay is intended as an aid in the diagnosis of influenza from Nasopharyngeal swab specimens and should not be used as a sole basis for treatment. Nasal washings and aspirates are unacceptable for Xpert Xpress SARS-CoV-2/FLU/RSV testing.  Fact Sheet for Patients: EntrepreneurPulse.com.au  Fact Sheet for Healthcare Providers: IncredibleEmployment.be  This test is not yet approved or cleared by the [place] FDA and has been authorized for detection and/or diagnosis of SARS-CoV-2 by FDA under an Emergency Use Authorization (EUA). This EUA will remain in effect (meaning this test can be used) for the duration of the COVID-19 declaration under Section 564(b)(1) of the Act, 21 U.S.C. section 360bbb-3(b)(1), unless the authorization is terminated or revoked.  Performed at [facility], [address] [place], [place] [address]   Basic metabolic panel     Status: Abnormal   Collection Time: 07/23/21  9:06 AM  Result Value Ref Range   Sodium 138 135 - 145 mmol/L   Potassium 3.7 3.5 - 5.1 mmol/L   Chloride 105 98 - 111 mmol/L   CO2 25 22 - 32 mmol/L   Glucose, Bld 156 (H) 70 - 99 mg/dL    Comment: Glucose reference range applies only to samples taken after fasting for at least 8 hours.   BUN 13 8 - 23 mg/dL   Creatinine, Ser 0.77 0.44 - 1.00 mg/dL   Calcium 8.5 (L) 8.9 - 10.3 mg/dL   GFR, Estimated >60 >60 mL/min    Comment: (NOTE) Calculated using the CKD-EPI Creatinine Equation (2021)    Anion gap 8 5 - 15    Comment:  Performed at  [facility], [address] [place], [place] [address]  CBC with Differential     Status: Abnormal   Collection Time: 07/23/21  9:06 AM  Result Value Ref Range   WBC 5.9 4.0 - 10.5 K/uL   RBC 3.75 (L) 3.87 - 5.11 MIL/uL   Hemoglobin 11.9 (L) 12.0 - 15.0 g/dL   HCT 34.9 (L) 36.0 - 46.0 %   MCV 93.1 80.0 - 100.0 fL   MCH 31.7 26.0 - 34.0 pg   MCHC 34.1 30.0 - 36.0 g/dL   RDW 16.0 (H) 11.5 - 15.5 %   Platelets 153 150 - 400 K/uL   nRBC 0.0 0.0 - 0.2 %   Neutrophils Relative % 75 %   Neutro Abs 4.5 1.7 - 7.7 K/uL   Lymphocytes Relative 12 %   Lymphs Abs 0.7 0.7 - 4.0 K/uL   Monocytes Relative 10 %   Monocytes Absolute 0.6 0.1 - 1.0 K/uL   Eosinophils Relative 1 %   Eosinophils Absolute 0.0 0.0 - 0.5 K/uL   Basophils Relative 1 %   Basophils Absolute 0.0 0.0 - 0.1 K/uL   Immature Granulocytes 1 %   Abs Immature Granulocytes 0.03 0.00 - 0.07 K/uL    Comment: Performed at [facility], [place], [place] [address]  Troponin I (High Sensitivity)     Status: None   Collection Time: 07/23/21  9:06 AM  Result Value Ref Range   Troponin I (High Sensitivity) 7 <18 ng/L    Comment: (NOTE) Elevated high sensitivity troponin I (hsTnI) values and significant  changes across serial measurements may suggest ACS but many other  chronic and acute conditions are known to elevate hsTnI results.  Refer to the "Links" section for chest pain algorithms and additional  guidance. Performed at [facility], [place], [place] [address]   Troponin I (High Sensitivity)     Status: None   Collection Time: 07/23/21 12:30 PM  Result Value Ref Range   Troponin I (High Sensitivity) 9 <18 ng/L    Comment: (NOTE) Elevated high sensitivity troponin I (hsTnI) values and significant  changes across serial measurements may suggest ACS but many other  chronic and acute conditions are known to elevate hsTnI results.  Refer to the "Links"  section for chest pain algorithms and additional  guidance. Performed at [facility], [address] [place], [place] [address]   TSH     Status: None   Collection Time: 07/23/21 12:30 PM  Result Value Ref Range   TSH 1.583 0.350 - 4.500 uIU/mL    Comment: Performed by a 3rd Generation assay with a functional sensitivity of <=0.01 uIU/mL. Performed at [facility], [address] [place], [place] [address]   MRSA Next Gen by PCR, Nasal     Status: None   Collection Time: 07/23/21  3:50 PM   Specimen: Nasal Mucosa; Nasal Swab  Result Value Ref Range   MRSA by PCR Next Gen NOT DETECTED NOT DETECTED    Comment: (NOTE) The GeneXpert MRSA Assay (FDA approved for NASAL specimens only), is one component of a comprehensive MRSA colonization surveillance program. It is not intended to diagnose MRSA infection nor to guide or monitor treatment for MRSA infections. Test performance is not FDA approved in patients less than 18 years old. Performed at [facility], [address] [place], [place] [address]   Glucose, capillary     Status: Abnormal   Collection Time:  07/23/21  3:55 PM  Result Value Ref Range   Glucose-Capillary 137 (H) 70 - 99 mg/dL    Comment: Glucose reference range applies only to samples taken after fasting for at least 8 hours.  Basic metabolic panel     Status: Abnormal   Collection Time: 07/24/21  3:51 AM  Result Value Ref Range   Sodium 137 135 - 145 mmol/L   Potassium 3.7 3.5 - 5.1 mmol/L   Chloride 103 98 - 111 mmol/L   CO2 28 22 - 32 mmol/L   Glucose, Bld 114 (H) 70 - 99 mg/dL    Comment: Glucose reference range applies only to samples taken after fasting for at least 8 hours.   BUN 11 8 - 23 mg/dL   Creatinine, Ser 0.69 0.44 - 1.00 mg/dL   Calcium 8.9 8.9 - 10.3 mg/dL   GFR, Estimated >60 >60 mL/min    Comment: (NOTE) Calculated using the CKD-EPI Creatinine Equation (2021)    Anion gap 6 5 - 15    Comment: Performed at  [facility], [address] [place], [place] [address]  CBC     Status: Abnormal   Collection Time: 07/24/21  3:51 AM  Result Value Ref Range   WBC 6.4 4.0 - 10.5 K/uL   RBC 4.10 3.87 - 5.11 MIL/uL   Hemoglobin 13.0 12.0 - 15.0 g/dL   HCT 37.8 36.0 - 46.0 %   MCV 92.2 80.0 - 100.0 fL   MCH 31.7 26.0 - 34.0 pg   MCHC 34.4 30.0 - 36.0 g/dL   RDW 15.8 (H) 11.5 - 15.5 %   Platelets 158 150 - 400 K/uL   nRBC 0.0 0.0 - 0.2 %    Comment: Performed at [facility], [address] [place], [place] [address]  ECHOCARDIOGRAM COMPLETE     Status: None   Collection Time: 07/24/21 11:00 AM  Result Value Ref Range   Weight 1,961.21 oz   Height 66 in   BP 146/48 mmHg   Ao pk vel 1.48 m/s   AR max vel 1.97 cm2   AV Peak grad 8.8 mmHg   Single Plane A2C EF 44.1 %   Single Plane A4C EF 52.1 %   Calc EF 48.1 %   S' Lateral 3.20 cm   Area-P 1/2 4.24 cm2     PHQ2/9: Depression screen [facility] 2/9 09/14/2021 08/02/2021 05/26/2021 03/14/2021 12/01/2020  Decreased Interest 0 0 0 0 0  Down, Depressed, Hopeless 0 0 0 0 0  PHQ - 2 Score 0 0 0 0 0  Altered sleeping 0 - - - -  Tired, decreased energy 0 - - - -  Change in appetite 0 - - - -  Feeling bad or failure about yourself  0 - - - -  Trouble concentrating 0 - - - -  Moving slowly or fidgety/restless 0 - - - -  Suicidal thoughts 0 - - - -  PHQ-9 Score 0 - - - -  Difficult doing work/chores - - - - -  Some recent data might be hidden    phq 9 is negative   Fall Risk: Fall Risk  09/14/2021 08/02/2021 05/26/2021 03/14/2021 12/01/2020  Falls in the past year? 0 0 0 0 0  Number falls in past yr: 0 - 0 0 0  Injury with Fall? 0 - 0 0 0  Comment - - - - -  Risk for fall due to : No Fall Risks - No Fall  Risks - -  Risk for fall due to: Comment - - - - -  Follow up Falls prevention discussed Falls prevention discussed Falls prevention discussed - -      Functional Status Survey: Is the patient deaf or have difficulty hearing?:  No Does the patient have difficulty seeing, even when wearing glasses/contacts?: No Does the patient have difficulty concentrating, remembering, or making decisions?: No Does the patient have difficulty walking or climbing stairs?: Yes Does the patient have difficulty dressing or bathing?: No Does the patient have difficulty doing errands alone such as visiting a doctor's office or shopping?: No    Assessment & Plan  1. Chronic a-fib ([place])   2. Chronic diastolic CHF (congestive heart failure) ([facility])  - COMPLETE METABOLIC PANEL WITH GFR  3. Post-herpetic polyneuropathy  Off gabapentin and states pain is better now   4. 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  Dispense: 30 capsule; Refill: 0  6. Senile purpura ([place])   7. Hypothyroidism in adult   8. SVT (supraventricular tachycardia) ([place])   9. Pulmonary hypertension ([place])   10. GAD (generalized anxiety disorder)   11. Dyslipidemia  - Lipid panel  12. Stage 3a chronic kidney disease ([place])   13. Hyperglycemia

## 2021-09-14 ENCOUNTER — Other Ambulatory Visit: Payer: Self-pay | Admitting: Cardiovascular Disease

## 2021-09-14 ENCOUNTER — Other Ambulatory Visit: Payer: Self-pay

## 2021-09-14 ENCOUNTER — Encounter: Payer: Self-pay | Admitting: Family Medicine

## 2021-09-14 ENCOUNTER — Ambulatory Visit (INDEPENDENT_AMBULATORY_CARE_PROVIDER_SITE_OTHER): Payer: Medicare Other | Admitting: Family Medicine

## 2021-09-14 VITALS — BP 138/70 | HR 87 | Temp 97.9°F | Resp 16 | Ht 66.0 in | Wt 117.0 lb

## 2021-09-14 DIAGNOSIS — F5105 Insomnia due to other mental disorder: Secondary | ICD-10-CM

## 2021-09-14 DIAGNOSIS — I482 Chronic atrial fibrillation, unspecified: Secondary | ICD-10-CM | POA: Diagnosis not present

## 2021-09-14 DIAGNOSIS — D692 Other nonthrombocytopenic purpura: Secondary | ICD-10-CM

## 2021-09-14 DIAGNOSIS — E785 Hyperlipidemia, unspecified: Secondary | ICD-10-CM | POA: Diagnosis not present

## 2021-09-14 DIAGNOSIS — B0223 Postherpetic polyneuropathy: Secondary | ICD-10-CM | POA: Diagnosis not present

## 2021-09-14 DIAGNOSIS — I272 Pulmonary hypertension, unspecified: Secondary | ICD-10-CM

## 2021-09-14 DIAGNOSIS — R739 Hyperglycemia, unspecified: Secondary | ICD-10-CM

## 2021-09-14 DIAGNOSIS — E039 Hypothyroidism, unspecified: Secondary | ICD-10-CM

## 2021-09-14 DIAGNOSIS — I5032 Chronic diastolic (congestive) heart failure: Secondary | ICD-10-CM

## 2021-09-14 DIAGNOSIS — N1831 Chronic kidney disease, stage 3a: Secondary | ICD-10-CM | POA: Diagnosis not present

## 2021-09-14 DIAGNOSIS — I10 Essential (primary) hypertension: Secondary | ICD-10-CM | POA: Diagnosis not present

## 2021-09-14 DIAGNOSIS — F411 Generalized anxiety disorder: Secondary | ICD-10-CM

## 2021-09-14 DIAGNOSIS — I471 Supraventricular tachycardia: Secondary | ICD-10-CM | POA: Diagnosis not present

## 2021-09-14 DIAGNOSIS — Z23 Encounter for immunization: Secondary | ICD-10-CM

## 2021-09-14 DIAGNOSIS — F409 Phobic anxiety disorder, unspecified: Secondary | ICD-10-CM

## 2021-09-14 MED ORDER — TEMAZEPAM 15 MG PO CAPS: 15.0000 mg | ORAL_CAPSULE | Freq: Every day | ORAL | 0 refills | Status: DC

## 2021-09-14 NOTE — Telephone Encounter (Signed)
Refill Request.  

## 2021-09-14 NOTE — Telephone Encounter (Signed)
Eliquis 5 mg refill request received. Patient is 80 years old, weight-55.8 kg, Crea- 0.69 on 08/11/21, Diagnosis-PAF, and last seen by Christell Faith, PA on 08/03/21. Dose is appropriate based on dosing criteria. Will send in refill to requested pharmacy.

## 2021-09-15 LAB — COMPLETE METABOLIC PANEL WITH GFR
AG Ratio: 2 (calc) (ref 1.0–2.5)
ALT: 36 U/L — ABNORMAL HIGH (ref 6–29)
AST: 41 U/L — ABNORMAL HIGH (ref 10–35)
Albumin: 4.8 g/dL (ref 3.6–5.1)
Alkaline phosphatase (APISO): 83 U/L (ref 37–153)
BUN/Creatinine Ratio: 16 (calc) (ref 6–22)
BUN: 17 mg/dL (ref 7–25)
CO2: 26 mmol/L (ref 20–32)
Calcium: 10 mg/dL (ref 8.6–10.4)
Chloride: 103 mmol/L (ref 98–110)
Creat: 1.09 mg/dL — ABNORMAL HIGH (ref 0.60–1.00)
Globulin: 2.4 g/dL (calc) (ref 1.9–3.7)
Glucose, Bld: 118 mg/dL — ABNORMAL HIGH (ref 65–99)
Potassium: 5.1 mmol/L (ref 3.5–5.3)
Sodium: 141 mmol/L (ref 135–146)
Total Bilirubin: 0.6 mg/dL (ref 0.2–1.2)
Total Protein: 7.2 g/dL (ref 6.1–8.1)
eGFR: 52 mL/min/{1.73_m2} — ABNORMAL LOW (ref >=60)

## 2021-09-15 LAB — LIPID PANEL
Cholesterol: 148 mg/dL (ref <200)
HDL: 75 mg/dL (ref >=50)
LDL Cholesterol (Calc): 58 mg/dL (calc)
Non-HDL Cholesterol (Calc): 73 mg/dL (calc) (ref <130)
Total CHOL/HDL Ratio: 2 (calc) (ref <5.0)
Triglycerides: 71 mg/dL (ref <150)

## 2021-09-20 DIAGNOSIS — M47816 Spondylosis without myelopathy or radiculopathy, lumbar region: Secondary | ICD-10-CM | POA: Diagnosis not present

## 2021-09-21 DIAGNOSIS — I5032 Chronic diastolic (congestive) heart failure: Secondary | ICD-10-CM

## 2021-09-21 DIAGNOSIS — I480 Paroxysmal atrial fibrillation: Secondary | ICD-10-CM

## 2021-09-30 NOTE — H&P (View-Only) (Signed)
Cardiology Office Note    Date:  10/03/2021   ID:  DANINE HOR, DOB 1941/05/19, MRN [id number]  PCP:  Steele Sizer, MD  Cardiologist:  Ida Rogue, MD  Electrophysiologist:  None   Chief Complaint: Follow-up  History of Present Illness:   [name] is a 80 y.o. female with history of AVNRT status post catheter ablation, PAF status post multiple DCCV's as outlined below, chronic combined systolic and diastolic CHF, orthostasis, LBBB, HTN, HLD, degenerative joint disease, leg pain with normal ABIs in 01/2018, and mild carotid artery disease who presents for follow-up of mitral regurgitation.   Zio patch in 01/2018 showed a predominant rhythm of sinus with an average rate of 66 bpm (range 30 to 179 bpm), bundle branch block/IVCD was present, 45 runs of SVT were noted with the fastest interval lasting 13 beats and the longest interval lasting 14.5 seconds, some episodes of SVT may have been a short bursts of A. fib/flutter, A. fib occurred with a 2% burden with the longest episode lasting 4 hours and 24 minutes, second-degree AV block type I was present.   In 11/2017 and she was found to have frequent PVCs and fusion beats with subsequent event monitoring showing runs of SVT as well as runs of A. fib.  She was placed on Xarelto.  She was seen in clinic in 02/2018 and noted to be in A. fib with RVR despite beta-blocker and calcium channel blocker therapy.  In this setting, she was placed on amiodarone with plans for outpatient DCCV.  However, she was admitted with a CHF exacerbation in the context of A. fib with RVR in 03/2018.  She was cardioverted at that time, though had recurrent A. fib and heart failure in 04/2018 requiring repeat amiodarone and DCCV.     She was admitted to the hospital in 10/2020 with acute respiratory failure and hypoxia in the context of multifocal pneumonia treated with antibiotics, steroids, and nebulizers.  Echo at that time showed an EF of 60 to 65%, no regional  wall motion abnormalities, grade 1 diastolic dysfunction, normal RV systolic function and ventricular cavity size, moderately elevated PASP estimated at 54.8 mmHg, and mild to moderate mitral valve regurgitation with 2 jets noted.  She was admitted to the hospital in 07/2021 with near syncope and possible orthostatic hypotension.  She had been in severe pain that related to what was felt to be herpes zoster.  Cardiac enzymes were negative.  BNP 145.  Carotid artery ultrasound showed no hemodynamically significant stenosis bilaterally.  Echo showed an EF of 55 to 60%, no regional wall motion abnormalities, moderate LVH, grade 1 diastolic dysfunction, normal RV systolic function and ventricular cavity size, normal PASP, mild mitral valve regurgitation, mild to moderate tricuspid regurgitation, mild aortic valve insufficiency, mild to moderate aortic valve sclerosis without evidence of stenosis, and an estimated right atrial pressure of 3 mmHg.  She was evaluated by EP with symptoms felt to be related to known orthostatic intolerance and exacerbated by pain from her presumed zoster infection.  It was felt bradycardia was not contributing to her presentation.  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  There was no indication for pacing.  With regards to her symptoms and known LBBB, given this was not abrupt onset and offset this was felt to be unlikely contributing with recommendation for loop recorder if symptoms recur.  EP recommended discontinuation of metoprolol.  At time of discharge, primary service held amiodarone.  She was seen in follow-up on 08/03/2021 and was doing well from a cardiac perspective.  She has not had any further presyncopal episodes.  Her discomfort from shingles persisted, though was improving.  She felt like she was improving.  Amiodarone was restarted in an effort to maintain sinus rhythm.  It was recommended that she continue to hold metoprolol given  prior bradycardia and first-degree AV block.  She comes in doing well from a cardiac perspective.  No further near syncopal episodes.  No angina, dyspnea, palpitations, dizziness, presyncope, or syncope.  No lower extremity swelling or orthopnea.  No falls, hematochezia, melena, hemoptysis, hematemesis, or hematuria.  Blood pressure log shows her readings typically run in the 448J to 856D systolic with an occasional reading into the 149F systolic.  She reports she does note some lightheadedness if her BP will get around 026 systolic.  She has tolerated low-dose amlodipine 2.5 mg daily without issues.  She remains on amiodarone and apixaban.  Pain from herpes zoster has resolved.   Labs independently reviewed: 08/2021 - BUN 17, serum creatinine 1.09, potassium 5.1, albumin 4.8, AST 41, ALT 36, TC 148, TG 71, HDL 75, LDL 58 07/2021 - Hgb 13.0, PLT 158, TSH normal 11/2020 - magnesium 2.5 10/2020 - albumin 2.6, AST/ALT normal 08/2020 - A1c 5.2  Past Medical History:  Diagnosis Date   Anxiety    Bronchitis    Chronic combined systolic (congestive) and diastolic (congestive) heart failure ([place])    a. 02/2018 Echo: EF 45-50%, diff HK, triv AI, mod MR, mildly dil LA/RA, nl RV fxn, mod TR. PASP 40-11mHg.   Chronic kidney disease    DJD (degenerative joint disease), cervical    History of stress test    a. 01/2008 MV: EF 76%. Fair ex tol. No ischemia.   Hyperlipidemia    Hypertension    Hypothyroid    LBBB (left bundle branch block)    Leg pain    a. 01/2018 ABI's wnl.   Lumbar spondylosis    [place]    a. 01/2018 Event monitor: 45 runs of SVT, longest 14.5 sec. SVT felt to be Afib/flutter-->2% burden. Longest run of AF 4h 257mCHA2DS2VASc = 5-->Xarelto); b. 03/2019 s/p DCCV; c. 04/2019 Repeat Amio load and DCCV.   [place]ricular [place]) [place]   a. AVNRT - s/p ablation by Dr TaLovena Le/13    Past Surgical History:  Procedure Laterality Date   BACK SURGERY   07/2019   CARDIOVERSION N/A 03/26/2018   Procedure: CARDIOVERSION;  Surgeon: EnNelva BushMD;  Location: [facility] ORS;  Service: Cardiovascular;  Laterality: N/A;   CARDIOVERSION N/A 04/24/2018   Procedure: CARDIOVERSION;  Surgeon: GoMinna MerrittsMD;  Location: [facility] ORS;  Service: Cardiovascular;  Laterality: N/A;   CATARACT EXTRACTION W/PHACO Right 07/12/2021   Procedure: CATARACT EXTRACTION PHACO AND INTRAOCULAR LENS PLACEMENT (IOC) RIGHT 16.92 01:30.8 ;  Surgeon: PoBirder RobsonMD;  [place] [facility];  Laterality: Right;   ELECTROPHYSIOLOGY STUDY N/A 02/27/2012   Procedure: ELECTROPHYSIOLOGY STUDY;  Surgeon: GrEvans LanceMD;  [facility];  Service: Cardiovascular;  Laterality: N/A;   EPS and ablation for SVT  5/13   slow pathway ablation by Dr TaLovena Le EYE SURGERY     surgery for detatched retina   NECK SURGERY  02/2020   SUPRAVENTRICULAR TACHYCARDIA ABLATION N/A 02/27/2012   Procedure: SUPRAVENTRICULAR TACHYCARDIA ABLATION;  Surgeon: GrEvans LanceMD;  [facility];  Service: Cardiovascular;  Laterality: N/A;    Current Medications: Current Meds  Medication Sig   acetaminophen (TYLENOL) 650 MG CR tablet Take 650-1,300 mg by mouth every 8 (eight) hours as needed for pain.    amiodarone (PACERONE) 200 MG tablet Take 1 tablet (200 mg total) by mouth daily.   amLODipine (NORVASC) 2.5 MG tablet Take 1 tablet (2.5 mg total) by mouth daily.   BINAXNOW COVID-19 AG HOME TEST KIT Use as Directed on the Package   busPIRone (BUSPAR) 5 MG tablet TAKE 1 TABLET(5 MG) BY MOUTH TWICE DAILY   cholecalciferol (VITAMIN D) 1000 UNITS tablet Take 2,000 Units by mouth daily.   Cranberry 500 MG TABS Take 2 tablets by mouth daily.   doxazosin (CARDURA) 1 MG tablet Take 1 tablet (1 mg total) by mouth every evening.   ELIQUIS 5 MG TABS tablet TAKE 1 TABLET(5 MG) BY MOUTH TWICE DAILY   furosemide (LASIX) 20 MG tablet Take 20 mg by mouth.    levothyroxine (SYNTHROID) 137 MCG tablet Take 1 tablet (137 mcg total) by mouth daily before breakfast.   loratadine (CLARITIN) 10 MG tablet Take 10 mg by mouth daily.   Melatonin 10 MG TABS Take 10 mg by mouth at bedtime as needed.   Multiple Vitamin (MULITIVITAMIN WITH MINERALS) TABS Take 1 tablet by mouth daily.   potassium chloride (KLOR-CON) 10 MEQ tablet TAKE 1 TABLET BY MOUTH EVERY DAY AS DIRECTED. MAY TAKE AN EXTRA TABLET AFTER LUNCH WITH FLUID TABLET AS NEEDED FOR SWELLING   rosuvastatin (CRESTOR) 40 MG tablet TAKE 1 TABLET(40 MG) BY MOUTH DAILY   temazepam (RESTORIL) 15 MG capsule Take 1 capsule (15 mg total) by mouth at bedtime.    Allergies:   Chocolate, Penicillins, Codeine, Erythromycin, and Septra [sulfamethoxazole-trimethoprim]   Social History   Socioeconomic History   Marital status: Widowed    Spouse name: Not on file   Number of children: 1   Years of education: Not on file   Highest education level: 12th grade  Occupational History   Occupation: Retired  Tobacco Use   Smoking status: Never   Smokeless tobacco: Never   Tobacco comments:    smoking cessation materials not required  Vaping Use   Vaping Use: Never used  Substance and Sexual Activity   Alcohol use: No   Drug use: No   Sexual activity: Not Currently    Partners: Male    Birth control/protection: Post-menopausal  Other Topics Concern   Not on file  Social History Narrative   Lives in [place], she has a boyfriend   She has cats, some inside, some outside    Social Determinants of Health   Financial Resource Strain: Low Risk    Difficulty of Paying Living Expenses: Not hard at all  Food Insecurity: No Food Insecurity   Worried About Charity fundraiser in the Last Year: Never true   Arboriculturist in the Last Year: Never true  Transportation Needs: No Transportation Needs   Lack of Transportation (Medical): No   Lack of Transportation (Non-Medical): No  Physical Activity: Inactive    Days of Exercise per Week: 0 days   Minutes of Exercise per Session: 0 min  Stress: No Stress Concern Present   Feeling of Stress : Only a little  Social Connections: Moderately Integrated   Frequency of Communication with Friends and Family: More than three times a week   Frequency of Social Gatherings with Friends and Family: More than three times  a week   Attends Religious Services: More than 4 times per year   Active Member of Clubs or Organizations: Yes   Attends Archivist Meetings: More than 4 times per year   Marital Status: Widowed     Family History:  The patient's family history includes Alzheimer's disease in her maternal grandfather and mother; Breast cancer in her father and paternal aunt; Heart disease in her father; Kidney cancer in her maternal grandmother; Stroke in her father and paternal grandfather; Thyroid disease in her paternal grandmother.  ROS:   12-point review of systems is negative unless otherwise noted in the HPI   EKGs/Labs/Other Studies Reviewed:    Studies reviewed were summarized above. The additional studies were reviewed today:  2D echo 07/24/2021: 1. Left ventricular ejection fraction, by estimation, is 55 to 60%. The  left ventricle has normal function. The left ventricle has no regional  wall motion abnormalities. There is moderate left ventricular hypertrophy.  Left ventricular diastolic  parameters are consistent with Grade I diastolic dysfunction (impaired  relaxation).   2. Right ventricular systolic function is normal. The right ventricular  size is normal. There is normal pulmonary artery systolic pressure.   3. The mitral valve is abnormal. Mild mitral valve regurgitation. No  evidence of mitral stenosis.   4. Tricuspid valve regurgitation is mild to moderate.   5. The aortic valve is tricuspid. There is mild calcification of the  aortic valve. There is mild thickening of the aortic valve. Aortic valve  regurgitation is  mild. Mild to moderate aortic valve  sclerosis/calcification is present, without any evidence  of aortic stenosis.   6. The inferior vena cava is normal in size with greater than 50%  respiratory variability, suggesting right atrial pressure of 3 mmHg. __________   2D echo 10/31/2020: 1. Left ventricular ejection fraction, by estimation, is 60 to 65%. The  left ventricle has normal function. The left ventricle has no regional  wall motion abnormalities. Left ventricular diastolic parameters are  consistent with Grade I diastolic  dysfunction (impaired relaxation).   2. Right ventricular systolic function is normal. The right ventricular  size is normal. There is moderately elevated pulmonary artery systolic  pressure. The estimated right ventricular systolic pressure is 88.9 mmHg.   3. The mitral valve is normal in structure. Mild to moderate mitral valve  regurgitation. Two jets. No evidence of mitral stenosis. __________   2D echo 03/21/2018: - Left ventricle: The cavity size was normal. Wall thickness was    increased in a pattern of mild LVH. Systolic function was mildly    reduced. The estimated ejection fraction was in the range of 45%    to 50%. Diffuse hypokinesis.  - Aortic valve: There was trivial regurgitation.  - Mitral valve: Mildly calcified annulus. There was moderate    regurgitation directed posteriorly.  - Left atrium: The atrium was mildly dilated.  - Right ventricle: The cavity size was normal. Systolic function    was normal.  - Right atrium: The atrium was mildly dilated.  - Tricuspid valve: There was moderate regurgitation.  - Pulmonary arteries: Systolic pressure was mildly to moderately    increased, in the range of 40 mm Hg to 45 mm Hg.  - Inferior vena cava: The vessel was normal in size. The    respirophasic diameter changes were blunted (< 50%), consistent    with elevated central venous pressure.  - Pericardium, extracardiac: A trivial pericardial  effusion was    identified.  __________   Elwyn Reach patch 01/2018: Normal sinus rhythm min HR of 30 bpm, max HR of 179 bpm, and avg HR of 66 bpm.   Bundle Branch Block/IVCD was present.   45 Supraventricular Tachycardia runs occurred, the run with the fastest interval lasting 13 beats with a max rate of 179 bpm, the longest lasting 14.5 secs with an avg rate of 96 bpm.    Some Episodes of Supraventricula Tachycardia May be short burst of Atrial fibrillation/Flutter.    Atrial Fibrillation occurred (2% burden), ranging from 56-129 bpm (avg of 79 bpm), the longest lasting 4 hours 24 mins with an avg rate of 79 bpm.    Second Degree AV Block-Mobitz I (Wenckebach) was present.    Atrial Fibrillation was detected within +/- 45 seconds of symptomatic patient event(s).   Other patient triggers with no arrhythmia noted   Isolated SVEs were rare (<1.0%), and no SVE Couplets or SVE Triplets were present. Isolated VEs were frequent (7.9%, 81203), VE Couplets were rare (<1.0%, 1332), and VE Triplets were rare (<1.0%, 71). Ventricular Bigeminy and Trigeminy were Present. __________   Carlton Adam MPI 02/01/2008: 1.     Fair exercise tolerance with no arrhythmia or ischemia.  2.     Normal LV function at 76% with no transient LV dilatation or  regional  wall motion abnormality.  3.     Fixed septal defect consistent with LEFT bundle branch block. No  reversible ischemia noted.    EKG:  EKG is ordered today.  The EKG ordered today demonstrates NSR, 70 bpm, LBBB (known)  Recent Labs: 11/24/2020: Magnesium 2.5 07/23/2021: B Natriuretic Peptide 145.5; TSH 1.583 07/24/2021: Hemoglobin 13.0; Platelets 158 09/14/2021: ALT 36; BUN 17; Creat 1.09; Potassium 5.1; Sodium 141  Recent Lipid Panel    Component Value Date/Time   CHOL 148 09/14/2021 0920   CHOL 164 06/06/2016 0907   TRIG 71 09/14/2021 0920   HDL 75 09/14/2021 0920   HDL 54 06/06/2016 0907   CHOLHDL 2.0 09/14/2021 0920   VLDL 32 (H) 03/12/2017  0815   LDLCALC 58 09/14/2021 0920    PHYSICAL EXAM:    VS:  BP (!) 180/70 (BP Location: Left Arm, Patient Position: Sitting, Cuff Size: Normal)    Pulse 70    Ht _0  (1.676 m)    Wt 121 lb 4 oz (55 kg)    SpO2 98%    BMI 19.57 kg/m   BMI: Body mass index is 19.57 kg/m.  Physical Exam Constitutional:      Appearance: She is well-developed.  HENT:     Head: Normocephalic and atraumatic.  Eyes:     General:        Right eye: No discharge.        Left eye: No discharge.  Neck:     Vascular: No JVD.  Cardiovascular:     Rate and Rhythm: Normal rate and regular rhythm.     Pulses:          Posterior tibial pulses are 2+ on the right side and 2+ on the left side.     Heart sounds: S1 normal and S2 normal. Heart sounds not distant. No midsystolic click and no opening snap. Murmur heard.  High-pitched blowing holosystolic murmur is present with a grade of 2/6 at the apex.    No friction rub.  Pulmonary:     Effort: Pulmonary effort is normal. No respiratory distress.     Breath sounds: Normal breath sounds. No decreased breath  sounds, wheezing or rales.  Chest:     Chest wall: No tenderness.  Abdominal:     General: There is no distension.     Palpations: Abdomen is soft.     Tenderness: There is no abdominal tenderness.  Musculoskeletal:     Cervical back: Normal range of motion.     Right lower leg: No edema.     Left lower leg: No edema.  Skin:    General: Skin is warm and dry.     Nails: There is no clubbing.  Neurological:     Mental Status: She is alert and oriented to person, place, and time.  Psychiatric:        Speech: Speech normal.        Behavior: Behavior normal.        Thought Content: Thought content normal.        Judgment: Judgment normal.    Wt Readings from Last 3 Encounters:  10/03/21 121 lb 4 oz (55 kg)  09/14/21 117 lb (53.1 kg)  08/03/21 123 lb (55.8 kg)     ASSESSMENT & PLAN:   Near syncope: No further episodes.  Possibly in the setting of  orthostasis exacerbated by increased vagal tone in the context of pain from herpes zoster.  No evidence of significant sinus node dysfunction on inpatient telemetry or outpatient twelve-lead EKG.  Should she have recurrent episodes, would recommend referral to EP for consideration of loop recorder given underlying LBBB, per EP recommendation.  PAF: Maintaining sinus rhythm on amiodarone.  No longer on beta-blocker secondary to bradycardia and first-degree AV block.  CHA2DS2-VASc at least 6 (CHF, HTN, age x2, vascular disease, sex category).  She remains on apixaban 5 mg twice daily as she does not meet reduced dosing criteria.  Once she turns 81, showed her weight remained less than 60 kg at that time, or if her serum creatinine trends to greater than 1.5 she will meet reduced dosing criteria.  No symptoms concerning for bleeding.  Check BMP and CBC.  Mitral regurgitation: Surface echo in 07/2021 demonstrated moderate mitral regurgitation with a posteriorly directed jet with question of underestimation.  Schedule TEE to further evaluate.  Chronic combined systolic and diastolic CHF: LV systolic function was normalized by echo in 10/2020.  She appears euvolemic and well compensated.  No longer on valsartan or metoprolol secondary to orthostasis and bradycardia.  She remains on low-dose furosemide.  Symptoms of orthostasis preclude escalation of GDMT at this time.  LBBB: Stable.  As above.  First-degree AV block: Resolved off beta-blocker.    HTN: Blood pressure is elevated in the office today, though she reports this is typical.  BP at home has been reasonably controlled with a degree of permissive hypertension given history of orthostatic hypotension.  Continue to allow a degree of permissive hypertension up to the 160s mmHg.  She remains on a low-dose amlodipine and furosemide.  HLD: LDL 53 in 08/2021.  She remains on rosuvastatin which is followed by her PCP.  Abnormal LFT: Noted on most recent labs  obtained by outside office.  PCP monitoring.    Shared Decision Making/Informed Consent{  The risks [esophageal damage, perforation (1:10,000 risk), bleeding, pharyngeal hematoma as well as other potential complications associated with conscious sedation including aspiration, arrhythmia, respiratory failure and death], benefits (treatment guidance and diagnostic support) and alternatives of a transesophageal echocardiogram were discussed in detail with [name] and she is willing to proceed.      Disposition: F/u with  Dr. Rockey Situ or an APP after TEE.   Medication Adjustments/Labs and Tests Ordered: Current medicines are reviewed at length with the patient today.  Concerns regarding medicines are outlined above. Medication changes, Labs and Tests ordered today are summarized above and listed in the Patient Instructions accessible in Encounters.   Signed, Christell Faith, PA-C 10/03/2021 1:03 PM     [facility] [address], [place] [address] [phone]

## 2021-09-30 NOTE — Progress Notes (Signed)
Cardiology Office Note    Date:  10/03/2021   ID:  [name], DOB [birth year], MRN [id number]  PCP:  Steele Sizer, MD  Cardiologist:  Ida Rogue, MD  Electrophysiologist:  None   Chief Complaint: Follow-up  History of Present Illness:   [name] is a 80 y.o. female with history of AVNRT status post catheter ablation, PAF status post multiple DCCV's as outlined below, chronic combined systolic and diastolic CHF, orthostasis, LBBB, HTN, HLD, degenerative joint disease, leg pain with normal ABIs in 01/2018, and mild carotid artery disease who presents for follow-up of mitral regurgitation.   Zio patch in 01/2018 showed a predominant rhythm of sinus with an average rate of 66 bpm (range 30 to 179 bpm), bundle branch block/IVCD was present, 45 runs of SVT were noted with the fastest interval lasting 13 beats and the longest interval lasting 14.5 seconds, some episodes of SVT may have been a short bursts of A. fib/flutter, A. fib occurred with a 2% burden with the longest episode lasting 4 hours and 24 minutes, second-degree AV block type I was present.   In 11/2017 and she was found to have frequent PVCs and fusion beats with subsequent event monitoring showing runs of SVT as well as runs of A. fib.  She was placed on Xarelto.  She was seen in clinic in 02/2018 and noted to be in A. fib with RVR despite beta-blocker and calcium channel blocker therapy.  In this setting, she was placed on amiodarone with plans for outpatient DCCV.  However, she was admitted with a CHF exacerbation in the context of A. fib with RVR in 03/2018.  She was cardioverted at that time, though had recurrent A. fib and heart failure in 04/2018 requiring repeat amiodarone and DCCV.     She was admitted to the hospital in 10/2020 with acute respiratory failure and hypoxia in the context of multifocal pneumonia treated with antibiotics, steroids, and nebulizers.  Echo at that time showed an EF of 60 to 65%, no regional  wall motion abnormalities, grade 1 diastolic dysfunction, normal RV systolic function and ventricular cavity size, moderately elevated PASP estimated at 54.8 mmHg, and mild to moderate mitral valve regurgitation with 2 jets noted.  She was admitted to the hospital in 07/2021 with near syncope and possible orthostatic hypotension.  She had been in severe pain that related to what was felt to be herpes zoster.  Cardiac enzymes were negative.  BNP 145.  Carotid artery ultrasound showed no hemodynamically significant stenosis bilaterally.  Echo showed an EF of 55 to 60%, no regional wall motion abnormalities, moderate LVH, grade 1 diastolic dysfunction, normal RV systolic function and ventricular cavity size, normal PASP, mild mitral valve regurgitation, mild to moderate tricuspid regurgitation, mild aortic valve insufficiency, mild to moderate aortic valve sclerosis without evidence of stenosis, and an estimated right atrial pressure of 3 mmHg.  She was evaluated by EP with symptoms felt to be related to known orthostatic intolerance and exacerbated by pain from her presumed zoster infection.  It was felt bradycardia was not contributing to her presentation.  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  There was no indication for pacing.  With regards to her symptoms and known LBBB, given this was not abrupt onset and offset this was felt to be unlikely contributing with recommendation for loop recorder if symptoms recur.  EP recommended discontinuation of metoprolol.  At time of discharge, primary service held amiodarone.  She was seen in follow-up on 08/03/2021 and was doing well from a cardiac perspective.  She has not had any further presyncopal episodes.  Her discomfort from shingles persisted, though was improving.  She felt like she was improving.  Amiodarone was restarted in an effort to maintain sinus rhythm.  It was recommended that she continue to hold metoprolol given  prior bradycardia and first-degree AV block.  She comes in doing well from a cardiac perspective.  No further near syncopal episodes.  No angina, dyspnea, palpitations, dizziness, presyncope, or syncope.  No lower extremity swelling or orthopnea.  No falls, hematochezia, melena, hemoptysis, hematemesis, or hematuria.  Blood pressure log shows her readings typically run in the 676H to 209O systolic with an occasional reading into the 709G systolic.  She reports she does note some lightheadedness if her BP will get around 283 systolic.  She has tolerated low-dose amlodipine 2.5 mg daily without issues.  She remains on amiodarone and apixaban.  Pain from herpes zoster has resolved.   Labs independently reviewed: 08/2021 - BUN 17, serum creatinine 1.09, potassium 5.1, albumin 4.8, AST 41, ALT 36, TC 148, TG 71, HDL 75, LDL 58 07/2021 - Hgb 13.0, PLT 158, TSH normal 11/2020 - magnesium 2.5 10/2020 - albumin 2.6, AST/ALT normal 08/2020 - A1c 5.2  Past Medical History:  Diagnosis Date   Anxiety    Bronchitis    Chronic combined systolic (congestive) and diastolic (congestive) heart failure ([place])    a. 02/2018 Echo: EF 45-50%, diff HK, triv AI, mod MR, mildly dil LA/RA, nl RV fxn, mod TR. PASP 40-9mHg.   Chronic kidney disease    DJD (degenerative joint disease), cervical    History of stress test    a. 01/2008 MV: EF 76%. Fair ex tol. No ischemia.   Hyperlipidemia    Hypertension    Hypothyroid    LBBB (left bundle branch block)    Leg pain    a. 01/2018 ABI's wnl.   Lumbar spondylosis    Persistent atrial fibrillation ([facility]    a. 01/2018 Event monitor: 45 runs of SVT, longest 14.5 sec. SVT felt to be Afib/flutter-->2% burden. Longest run of AF 4h 28mCHA2DS2VASc = 5-->Xarelto); b. 03/2019 s/p DCCV; c. 04/2019 Repeat Amio load and DCCV.   SVT (supraventricular tachycardia) ([facility]   a. AVNRT - s/p ablation by Dr TaLovena Le/13    Past Surgical History:  Procedure Laterality Date   BACK SURGERY   07/2019   CARDIOVERSION N/A 03/26/2018   Procedure: CARDIOVERSION;  Surgeon: EnNelva BushMD;  Location: [facility] ORS;  Service: Cardiovascular;  Laterality: N/A;   CARDIOVERSION N/A 04/24/2018   Procedure: CARDIOVERSION;  Surgeon: GoMinna MerrittsMD;  Location: [facility] ORS;  Service: Cardiovascular;  Laterality: N/A;   CATARACT EXTRACTION W/PHACO Right 07/12/2021   Procedure: CATARACT EXTRACTION PHACO AND INTRAOCULAR LENS PLACEMENT (IOC) RIGHT 16.92 01:30.8 ;  Surgeon: PoBirder RobsonMD;  [facility] Service: [facility];  Laterality: Right;   ELECTROPHYSIOLOGY STUDY N/A 02/27/2012   Procedure: ELECTROPHYSIOLOGY STUDY;  Surgeon: GrEvans LanceMD;  [facility];  Service: Cardiovascular;  Laterality: N/A;   EPS and ablation for SVT  5/13   slow pathway ablation by Dr TaLovena Le EYE SURGERY     surgery for detatched retina   NECK SURGERY  02/2020   SUPRAVENTRICULAR TACHYCARDIA ABLATION N/A 02/27/2012   Procedure: SUPRAVENTRICULAR TACHYCARDIA ABLATION;  Surgeon: GrEvans LanceMD;  [facility];  Service: Cardiovascular;  Laterality: N/A;    Current Medications: Current Meds  Medication Sig   acetaminophen (TYLENOL) 650 MG CR tablet Take 650-1,300 mg by mouth every 8 (eight) hours as needed for pain.    amiodarone (PACERONE) 200 MG tablet Take 1 tablet (200 mg total) by mouth daily.   amLODipine (NORVASC) 2.5 MG tablet Take 1 tablet (2.5 mg total) by mouth daily.   BINAXNOW COVID-19 AG HOME TEST KIT Use as Directed on the Package   busPIRone (BUSPAR) 5 MG tablet TAKE 1 TABLET(5 MG) BY MOUTH TWICE DAILY   cholecalciferol (VITAMIN D) 1000 UNITS tablet Take 2,000 Units by mouth daily.   Cranberry 500 MG TABS Take 2 tablets by mouth daily.   doxazosin (CARDURA) 1 MG tablet Take 1 tablet (1 mg total) by mouth every evening.   ELIQUIS 5 MG TABS tablet TAKE 1 TABLET(5 MG) BY MOUTH TWICE DAILY   furosemide (LASIX) 20 MG tablet Take 20 mg by mouth.    levothyroxine (SYNTHROID) 137 MCG tablet Take 1 tablet (137 mcg total) by mouth daily before breakfast.   loratadine (CLARITIN) 10 MG tablet Take 10 mg by mouth daily.   Melatonin 10 MG TABS Take 10 mg by mouth at bedtime as needed.   Multiple Vitamin (MULITIVITAMIN WITH MINERALS) TABS Take 1 tablet by mouth daily.   potassium chloride (KLOR-CON) 10 MEQ tablet TAKE 1 TABLET BY MOUTH EVERY DAY AS DIRECTED. MAY TAKE AN EXTRA TABLET AFTER LUNCH WITH FLUID TABLET AS NEEDED FOR SWELLING   rosuvastatin (CRESTOR) 40 MG tablet TAKE 1 TABLET(40 MG) BY MOUTH DAILY   temazepam (RESTORIL) 15 MG capsule Take 1 capsule (15 mg total) by mouth at bedtime.    Allergies:   Chocolate, Penicillins, Codeine, Erythromycin, and Septra [sulfamethoxazole-trimethoprim]   Social History   Socioeconomic History   Marital status: Widowed    Spouse name: Not on file   Number of children: 1   Years of education: Not on file   Highest education level: 12th grade  Occupational History   Occupation: Retired  Tobacco Use   Smoking status: Never   Smokeless tobacco: Never   Tobacco comments:    smoking cessation materials not required  Vaping Use   Vaping Use: Never used  Substance and Sexual Activity   Alcohol use: No   Drug use: No   Sexual activity: Not Currently    Partners: Male    Birth control/protection: Post-menopausal  Other Topics Concern   Not on file  Social History Narrative   Lives in [place], she has a boyfriend   She has cats, some inside, some outside    Social Determinants of Health   Financial Resource Strain: Low Risk    Difficulty of Paying Living Expenses: Not hard at all  Food Insecurity: No Food Insecurity   Worried About Charity fundraiser in the Last Year: Never true   Arboriculturist in the Last Year: Never true  Transportation Needs: No Transportation Needs   Lack of Transportation (Medical): No   Lack of Transportation (Non-Medical): No  Physical Activity: Inactive    Days of Exercise per Week: 0 days   Minutes of Exercise per Session: 0 min  Stress: No Stress Concern Present   Feeling of Stress : Only a little  Social Connections: Moderately Integrated   Frequency of Communication with Friends and Family: More than three times a week   Frequency of Social Gatherings with Friends and Family: More than three times  a week   Attends Religious Services: More than 4 times per year   Active Member of Clubs or Organizations: Yes   Attends Archivist Meetings: More than 4 times per year   Marital Status: Widowed     Family History:  The patient's family history includes Alzheimer's disease in her maternal grandfather and mother; Breast cancer in her father and paternal aunt; Heart disease in her father; Kidney cancer in her maternal grandmother; Stroke in her father and paternal grandfather; Thyroid disease in her paternal grandmother.  ROS:   12-point review of systems is negative unless otherwise noted in the HPI   EKGs/Labs/Other Studies Reviewed:    Studies reviewed were summarized above. The additional studies were reviewed today:  2D echo 07/24/2021: 1. Left ventricular ejection fraction, by estimation, is 55 to 60%. The  left ventricle has normal function. The left ventricle has no regional  wall motion abnormalities. There is moderate left ventricular hypertrophy.  Left ventricular diastolic  parameters are consistent with Grade I diastolic dysfunction (impaired  relaxation).   2. Right ventricular systolic function is normal. The right ventricular  size is normal. There is normal pulmonary artery systolic pressure.   3. The mitral valve is abnormal. Mild mitral valve regurgitation. No  evidence of mitral stenosis.   4. Tricuspid valve regurgitation is mild to moderate.   5. The aortic valve is tricuspid. There is mild calcification of the  aortic valve. There is mild thickening of the aortic valve. Aortic valve  regurgitation is  mild. Mild to moderate aortic valve  sclerosis/calcification is present, without any evidence  of aortic stenosis.   6. The inferior vena cava is normal in size with greater than 50%  respiratory variability, suggesting right atrial pressure of 3 mmHg. __________   2D echo 10/31/2020: 1. Left ventricular ejection fraction, by estimation, is 60 to 65%. The  left ventricle has normal function. The left ventricle has no regional  wall motion abnormalities. Left ventricular diastolic parameters are  consistent with Grade I diastolic  dysfunction (impaired relaxation).   2. Right ventricular systolic function is normal. The right ventricular  size is normal. There is moderately elevated pulmonary artery systolic  pressure. The estimated right ventricular systolic pressure is 73.4 mmHg.   3. The mitral valve is normal in structure. Mild to moderate mitral valve  regurgitation. Two jets. No evidence of mitral stenosis. __________   2D echo 03/21/2018: - Left ventricle: The cavity size was normal. Wall thickness was    increased in a pattern of mild LVH. Systolic function was mildly    reduced. The estimated ejection fraction was in the range of 45%    to 50%. Diffuse hypokinesis.  - Aortic valve: There was trivial regurgitation.  - Mitral valve: Mildly calcified annulus. There was moderate    regurgitation directed posteriorly.  - Left atrium: The atrium was mildly dilated.  - Right ventricle: The cavity size was normal. Systolic function    was normal.  - Right atrium: The atrium was mildly dilated.  - Tricuspid valve: There was moderate regurgitation.  - Pulmonary arteries: Systolic pressure was mildly to moderately    increased, in the range of 40 mm Hg to 45 mm Hg.  - Inferior vena cava: The vessel was normal in size. The    respirophasic diameter changes were blunted (< 50%), consistent    with elevated central venous pressure.  - Pericardium, extracardiac: A trivial pericardial  effusion was    identified.  __________   Elwyn Reach patch 01/2018: Normal sinus rhythm min HR of 30 bpm, max HR of 179 bpm, and avg HR of 66 bpm.   Bundle Branch Block/IVCD was present.   45 Supraventricular Tachycardia runs occurred, the run with the fastest interval lasting 13 beats with a max rate of 179 bpm, the longest lasting 14.5 secs with an avg rate of 96 bpm.    Some Episodes of Supraventricula Tachycardia May be short burst of Atrial fibrillation/Flutter.    Atrial Fibrillation occurred (2% burden), ranging from 56-129 bpm (avg of 79 bpm), the longest lasting 4 hours 24 mins with an avg rate of 79 bpm.    Second Degree AV Block-Mobitz I (Wenckebach) was present.    Atrial Fibrillation was detected within +/- 45 seconds of symptomatic patient event(s).   Other patient triggers with no arrhythmia noted   Isolated SVEs were rare (<1.0%), and no SVE Couplets or SVE Triplets were present. Isolated VEs were frequent (7.9%, 81203), VE Couplets were rare (<1.0%, 1332), and VE Triplets were rare (<1.0%, 71). Ventricular Bigeminy and Trigeminy were Present. __________   Carlton Adam MPI 02/01/2008: 1.     Fair exercise tolerance with no arrhythmia or ischemia.  2.     Normal LV function at 76% with no transient LV dilatation or  regional  wall motion abnormality.  3.     Fixed septal defect consistent with LEFT bundle branch block. No  reversible ischemia noted.    EKG:  EKG is ordered today.  The EKG ordered today demonstrates NSR, 70 bpm, LBBB (known)  Recent Labs: 11/24/2020: Magnesium 2.5 07/23/2021: B Natriuretic Peptide 145.5; TSH 1.583 07/24/2021: Hemoglobin 13.0; Platelets 158 09/14/2021: ALT 36; BUN 17; Creat 1.09; Potassium 5.1; Sodium 141  Recent Lipid Panel    Component Value Date/Time   CHOL 148 09/14/2021 0920   CHOL 164 06/06/2016 0907   TRIG 71 09/14/2021 0920   HDL 75 09/14/2021 0920   HDL 54 06/06/2016 0907   CHOLHDL 2.0 09/14/2021 0920   VLDL 32 (H) 03/12/2017  0815   LDLCALC 58 09/14/2021 0920    PHYSICAL EXAM:    VS:  BP (!) 180/70 (BP Location: Left Arm, Patient Position: Sitting, Cuff Size: Normal)   Pulse 70   Ht 5' 6"  (1.676 m)   Wt 121 lb 4 oz (55 kg)   SpO2 98%   BMI 19.57 kg/m   BMI: Body mass index is 19.57 kg/m.  Physical Exam Constitutional:      Appearance: She is well-developed.  HENT:     Head: Normocephalic and atraumatic.  Eyes:     General:        Right eye: No discharge.        Left eye: No discharge.  Neck:     Vascular: No JVD.  Cardiovascular:     Rate and Rhythm: Normal rate and regular rhythm.     Pulses:          Posterior tibial pulses are 2+ on the right side and 2+ on the left side.     Heart sounds: S1 normal and S2 normal. Heart sounds not distant. No midsystolic click and no opening snap. Murmur heard.  High-pitched blowing holosystolic murmur is present with a grade of 2/6 at the apex.    No friction rub.  Pulmonary:     Effort: Pulmonary effort is normal. No respiratory distress.     Breath sounds: Normal breath sounds. No decreased breath sounds, wheezing or rales.  Chest:     Chest wall: No tenderness.  Abdominal:     General: There is no distension.     Palpations: Abdomen is soft.     Tenderness: There is no abdominal tenderness.  Musculoskeletal:     Cervical back: Normal range of motion.     Right lower leg: No edema.     Left lower leg: No edema.  Skin:    General: Skin is warm and dry.     Nails: There is no clubbing.  Neurological:     Mental Status: She is alert and oriented to person, place, and time.  Psychiatric:        Speech: Speech normal.        Behavior: Behavior normal.        Thought Content: Thought content normal.        Judgment: Judgment normal.    Wt Readings from Last 3 Encounters:  10/03/21 121 lb 4 oz (55 kg)  09/14/21 117 lb (53.1 kg)  08/03/21 123 lb (55.8 kg)     ASSESSMENT & PLAN:   Near syncope: No further episodes.  Possibly in the setting of  orthostasis exacerbated by increased vagal tone in the context of pain from herpes zoster.  No evidence of significant sinus node dysfunction on inpatient telemetry or outpatient twelve-lead EKG.  Should she have recurrent episodes, would recommend referral to EP for consideration of loop recorder given underlying LBBB, per EP recommendation.  PAF: Maintaining sinus rhythm on amiodarone.  No longer on beta-blocker secondary to bradycardia and first-degree AV block.  CHA2DS2-VASc at least 6 (CHF, HTN, age x2, vascular disease, sex category).  She remains on apixaban 5 mg twice daily as she does not meet reduced dosing criteria.  Once she turns 81, showed her weight remained less than 60 kg at that time, or if her serum creatinine trends to greater than 1.5 she will meet reduced dosing criteria.  No symptoms concerning for bleeding.  Check BMP and CBC.  Mitral regurgitation: Surface echo in 07/2021 demonstrated moderate mitral regurgitation with a posteriorly directed jet with question of underestimation.  Schedule TEE to further evaluate.  Chronic combined systolic and diastolic CHF: LV systolic function was normalized by echo in 10/2020.  She appears euvolemic and well compensated.  No longer on valsartan or metoprolol secondary to orthostasis and bradycardia.  She remains on low-dose furosemide.  Symptoms of orthostasis preclude escalation of GDMT at this time.  LBBB: Stable.  As above.  First-degree AV block: Resolved off beta-blocker.    HTN: Blood pressure is elevated in the office today, though she reports this is typical.  BP at home has been reasonably controlled with a degree of permissive hypertension given history of orthostatic hypotension.  Continue to allow a degree of permissive hypertension up to the 160s mmHg.  She remains on a low-dose amlodipine and furosemide.  HLD: LDL 53 in 08/2021.  She remains on rosuvastatin which is followed by her PCP.  Abnormal LFT: Noted on most recent labs  obtained by outside office.  PCP monitoring.    Shared Decision Making/Informed Consent{  The risks [esophageal damage, perforation (1:10,000 risk), bleeding, pharyngeal hematoma as well as other potential complications associated with conscious sedation including aspiration, arrhythmia, respiratory failure and death], benefits (treatment guidance and diagnostic support) and alternatives of a transesophageal echocardiogram were discussed in detail with Ms. Harris and she is willing to proceed.      Disposition: F/u with Dr. Rockey Situ or an APP  after TEE.   Medication Adjustments/Labs and Tests Ordered: Current medicines are reviewed at length with the patient today.  Concerns regarding medicines are outlined above. Medication changes, Labs and Tests ordered today are summarized above and listed in the Patient Instructions accessible in Encounters.   Signed, Christell Faith, PA-C 10/03/2021 1:03 PM     [address] [place], [place] [address] [phone]

## 2021-10-03 ENCOUNTER — Other Ambulatory Visit: Payer: Self-pay

## 2021-10-03 ENCOUNTER — Ambulatory Visit (INDEPENDENT_AMBULATORY_CARE_PROVIDER_SITE_OTHER): Payer: Medicare Other | Admitting: Physician Assistant

## 2021-10-03 ENCOUNTER — Telehealth: Payer: Self-pay | Admitting: Physician Assistant

## 2021-10-03 ENCOUNTER — Encounter: Payer: Self-pay | Admitting: Physician Assistant

## 2021-10-03 VITALS — BP 180/70 | HR 70 | Ht 66.0 in | Wt 121.2 lb

## 2021-10-03 DIAGNOSIS — I340 Nonrheumatic mitral (valve) insufficiency: Secondary | ICD-10-CM | POA: Diagnosis not present

## 2021-10-03 DIAGNOSIS — E782 Mixed hyperlipidemia: Secondary | ICD-10-CM

## 2021-10-03 DIAGNOSIS — I480 Paroxysmal atrial fibrillation: Secondary | ICD-10-CM | POA: Diagnosis not present

## 2021-10-03 DIAGNOSIS — I10 Essential (primary) hypertension: Secondary | ICD-10-CM | POA: Diagnosis not present

## 2021-10-03 DIAGNOSIS — I447 Left bundle-branch block, unspecified: Secondary | ICD-10-CM | POA: Diagnosis not present

## 2021-10-03 DIAGNOSIS — I5042 Chronic combined systolic (congestive) and diastolic (congestive) heart failure: Secondary | ICD-10-CM | POA: Diagnosis not present

## 2021-10-03 DIAGNOSIS — R55 Syncope and collapse: Secondary | ICD-10-CM

## 2021-10-03 DIAGNOSIS — I440 Atrioventricular block, first degree: Secondary | ICD-10-CM

## 2021-10-03 NOTE — Patient Instructions (Addendum)
Medication Instructions:  No changes at this time.   *If you need a refill on your cardiac medications before your next appointment, please call your pharmacy*   Lab Work: CBC & BMET done today for your pre procedure labs  If you have labs (blood work) drawn today and your tests are completely normal, you will receive your results only by: [place] (if you have MyChart) OR A paper copy in the mail If you have any lab test that is abnormal or we need to change your treatment, we will call you to review the results.   Testing/Procedures: You are scheduled for a Transesophageal echocardiogram (TEE) on October 19, 2021 with Dr. Garen Lah.   Please arrive at the [place] [facility] at 07:30 a.m. on the day of your procedure.  DIET INSTRUCTIONS:  Nothing to eat or drink after midnight except your medications with a small sip of water.       Labs: CBC & BMET today  Medications:  HOLD Furosemide & Potassium the morning of your procedure. You may take that once you get back home.   YOU MAY TAKE ALL of your remaining medications with a small amount of water.  Must have a responsible person to drive you home.  Bring a current list of your medications and current insurance cards.    If you have any questions after you get home, please call the office at [phone]    Follow-Up: At [facility], you and your health needs are our priority.  As part of our continuing mission to provide you with exceptional heart care, we have created designated Provider Care Teams.  These Care Teams include your primary Cardiologist (physician) and Advanced Practice Providers (APPs -  Physician Assistants and Nurse Practitioners) who all work together to provide you with the care you need, when you need it.  Your next appointment:   Thursday, October 27, 2021 at 2:30 pm  The format for your next appointment:   In Person  Provider:   Christell Faith, PA-C

## 2021-10-03 NOTE — Telephone Encounter (Signed)
Patient calling concerned about possible wrong procedure.  Per avs cardioversion per patient TEE?  Please call

## 2021-10-03 NOTE — Telephone Encounter (Signed)
Spoke with patient and advised that the preporation for TEE is the same for cardioversion and that I will update her paperwork to reflect that. Apologies given and will mail her updated papers.

## 2021-10-06 ENCOUNTER — Other Ambulatory Visit
Admission: RE | Admit: 2021-10-06 | Discharge: 2021-10-06 | Disposition: A | Discharge status: Home / Self Care | Payer: Medicare Other | Attending: Physician Assistant | Admitting: Physician Assistant

## 2021-10-06 ENCOUNTER — Other Ambulatory Visit: Payer: Self-pay

## 2021-10-06 ENCOUNTER — Other Ambulatory Visit: Payer: Medicare Other

## 2021-10-06 DIAGNOSIS — I340 Nonrheumatic mitral (valve) insufficiency: Secondary | ICD-10-CM

## 2021-10-06 DIAGNOSIS — I480 Paroxysmal atrial fibrillation: Secondary | ICD-10-CM

## 2021-10-06 LAB — CBC
HCT: 39.8 % (ref 36.0–46.0)
Hemoglobin: 13.7 g/dL (ref 12.0–15.0)
MCH: 32.3 pg (ref 26.0–34.0)
MCHC: 34.4 g/dL (ref 30.0–36.0)
MCV: 93.9 fL (ref 80.0–100.0)
Platelets: 190 10*3/uL (ref 150–400)
RBC: 4.24 MIL/uL (ref 3.87–5.11)
RDW: 13.4 % (ref 11.5–15.5)
WBC: 6.1 10*3/uL (ref 4.0–10.5)
nRBC: 0 % (ref 0.0–0.2)

## 2021-10-06 LAB — BASIC METABOLIC PANEL
Anion gap: 9 (ref 5–15)
BUN: 13 mg/dL (ref 8–23)
CO2: 24 mmol/L (ref 22–32)
Calcium: 9.2 mg/dL (ref 8.9–10.3)
Chloride: 102 mmol/L (ref 98–111)
Creatinine, Ser: 0.71 mg/dL (ref 0.44–1.00)
GFR, Estimated: >60 mL/min (ref >60)
Glucose, Bld: 125 mg/dL — ABNORMAL HIGH (ref 70–99)
Potassium: 4.1 mmol/L (ref 3.5–5.1)
Sodium: 135 mmol/L (ref 135–145)

## 2021-10-10 ENCOUNTER — Other Ambulatory Visit: Payer: Self-pay | Admitting: Family Medicine

## 2021-10-10 DIAGNOSIS — F409 Phobic anxiety disorder, unspecified: Secondary | ICD-10-CM

## 2021-10-19 ENCOUNTER — Ambulatory Visit
Admission: RE | Admit: 2021-10-19 | Discharge: 2021-10-19 | Disposition: A | Discharge status: Home / Self Care | Payer: Medicare Other | Attending: Cardiology | Admitting: Cardiology

## 2021-10-19 ENCOUNTER — Other Ambulatory Visit: Payer: Self-pay

## 2021-10-19 ENCOUNTER — Encounter
Admission: RE | Disposition: A | Discharge status: Home / Self Care | Payer: Self-pay | Source: Home / Self Care | Attending: Cardiology

## 2021-10-19 ENCOUNTER — Encounter: Payer: Self-pay | Admitting: Cardiology

## 2021-10-19 ENCOUNTER — Ambulatory Visit (HOSPITAL_BASED_OUTPATIENT_CLINIC_OR_DEPARTMENT_OTHER)
Admission: RE | Admit: 2021-10-19 | Discharge: 2021-10-19 | Disposition: A | Discharge status: Home / Self Care | Payer: Medicare Other | Source: Home / Self Care | Attending: Physician Assistant | Admitting: Physician Assistant

## 2021-10-19 DIAGNOSIS — I340 Nonrheumatic mitral (valve) insufficiency: Secondary | ICD-10-CM | POA: Diagnosis not present

## 2021-10-19 DIAGNOSIS — Z79899 Other long term (current) drug therapy: Secondary | ICD-10-CM | POA: Insufficient documentation

## 2021-10-19 DIAGNOSIS — I447 Left bundle-branch block, unspecified: Secondary | ICD-10-CM | POA: Insufficient documentation

## 2021-10-19 DIAGNOSIS — I4819 Other persistent atrial fibrillation: Secondary | ICD-10-CM | POA: Diagnosis not present

## 2021-10-19 DIAGNOSIS — I361 Nonrheumatic tricuspid (valve) insufficiency: Secondary | ICD-10-CM

## 2021-10-19 DIAGNOSIS — I110 Hypertensive heart disease with heart failure: Secondary | ICD-10-CM | POA: Insufficient documentation

## 2021-10-19 DIAGNOSIS — I5042 Chronic combined systolic (congestive) and diastolic (congestive) heart failure: Secondary | ICD-10-CM | POA: Diagnosis not present

## 2021-10-19 DIAGNOSIS — E785 Hyperlipidemia, unspecified: Secondary | ICD-10-CM | POA: Diagnosis not present

## 2021-10-19 DIAGNOSIS — I371 Nonrheumatic pulmonary valve insufficiency: Secondary | ICD-10-CM | POA: Diagnosis not present

## 2021-10-19 HISTORY — PX: TEE WITHOUT CARDIOVERSION: SHX5443

## 2021-10-19 SURGERY — ECHOCARDIOGRAM, TRANSESOPHAGEAL
Anesthesia: Moderate Sedation

## 2021-10-19 MED ORDER — FENTANYL CITRATE (PF) 100 MCG/2ML IJ SOLN
INTRAMUSCULAR | Status: AC
  Filled 2021-10-19: qty 2

## 2021-10-19 MED ORDER — LIDOCAINE VISCOUS HCL 2 % MT SOLN
OROMUCOSAL | Status: AC
  Filled 2021-10-19: qty 15

## 2021-10-19 MED ORDER — FENTANYL CITRATE (PF) 100 MCG/2ML IJ SOLN
INTRAMUSCULAR | Status: AC | PRN
Start: 2021-10-19 — End: 2021-10-19
  Administered 2021-10-19 (×2): 25 ug via INTRAVENOUS

## 2021-10-19 MED ORDER — MIDAZOLAM HCL 2 MG/2ML IJ SOLN
INTRAMUSCULAR | Status: AC
  Filled 2021-10-19: qty 2

## 2021-10-19 MED ORDER — SODIUM CHLORIDE 0.9 % IV SOLN
INTRAVENOUS | Status: AC | PRN
  Administered 2021-10-19: 10 mL/h via INTRAVENOUS

## 2021-10-19 MED ORDER — MIDAZOLAM HCL 2 MG/2ML IJ SOLN
INTRAMUSCULAR | Status: AC | PRN
  Administered 2021-10-19: 1 mg via INTRAVENOUS

## 2021-10-19 MED ORDER — SODIUM CHLORIDE 0.9 % IV SOLN: INTRAVENOUS | Status: DC

## 2021-10-19 MED ORDER — BUTAMBEN-TETRACAINE-BENZOCAINE 2-2-14 % EX AERO
INHALATION_SPRAY | CUTANEOUS | Status: AC
  Filled 2021-10-19: qty 5

## 2021-10-19 NOTE — Interval H&P Note (Signed)
History and Physical Interval Note:  10/19/2021 9:16 AM  [name]  has presented today for surgery, with the diagnosis of Mitral regurgitation.  The various methods of treatment have been discussed with the patient and family. After consideration of risks, benefits and other options for treatment, the patient has consented to  Procedure(s): TRANSESOPHAGEAL ECHOCARDIOGRAM (TEE) (N/A) as a surgical intervention.  The patient's history has been reviewed, patient examined, no change in status, stable for surgery.  I have reviewed the patient's chart and labs.  Questions were answered to the patient's satisfaction.     Aaron Edelman Agbor-Etang

## 2021-10-19 NOTE — Procedures (Signed)
Transesophageal Echocardiogram :  Indication: mitral regurgitation  Procedure: 10 ml of viscous lidocaine were given orally to provide local anesthesia to the oropharynx. The patient was positioned supine on the left side, bite block provided. The patient was moderately sedated with the doses of versed and fentanyl as detailed below.  Using digital technique an omniplane probe was advanced into the esophagus without incident.   Moderate sedation: 1. Sedation used:  Versed: 1mg , Fentanyl: 50 mcg 2. Time administered:   838 Time when patient started recovery: 900 3. I was face to face during this time 22 mins  See report in EPIC  for complete details: In brief, imaging revealed normal LV function with no RWMAs and no mural apical thrombus.  Estimated ejection fraction was 55%.  Right sided cardiac chambers were normal with no evidence of pulmonary hypertension.  Mild mitral regurgitation noted  Imaging of the septum showed no ASD or VSD 2D and color flow confirmed no PFO  The LA was well visualized in orthogonal views.  There was no spontaneous contrast and no thrombus in the LA and LA appendage   The descending thoracic aorta had no  mural aortic debris with no evidence of aneurysmal dilation or disection   Aaron Edelman Agbor-Etang 10/19/2021 9:19 AM

## 2021-10-19 NOTE — Progress Notes (Signed)
*  PRELIMINARY RESULTS* Echocardiogram Echocardiogram Transesophageal has been performed.  [name] 10/19/2021, 9:39 AM

## 2021-10-20 NOTE — Progress Notes (Signed)
Cardiology Office Note    Date:  10/20/2021   ID:  [name], DOB 1941-09-17, MRN [id number]  PCP:  Steele Sizer, MD  Cardiologist:  Ida Rogue, MD  Electrophysiologist:  None   Chief Complaint: Follow up  History of Present Illness:   [name] is a 80 y.o. female with history of AVNRT status post catheter ablation, PAF status post multiple DCCV's as outlined below, chronic combined systolic and diastolic CHF, orthostasis, LBBB, HTN, HLD, degenerative joint disease, leg pain with normal ABIs in 01/2018, and mild carotid artery disease who presents for follow-up of TEE.   Zio patch in 01/2018 showed a predominant rhythm of sinus with an average rate of 66 bpm (range 30 to 179 bpm), bundle branch block/IVCD was present, 45 runs of SVT were noted with the fastest interval lasting 13 beats and the longest interval lasting 14.5 seconds, some episodes of SVT may have been a short bursts of A. fib/flutter, A. fib occurred with a 2% burden with the longest episode lasting 4 hours and 24 minutes, second-degree AV block type I was present.   In 11/2017 and she was found to have frequent PVCs and fusion beats with subsequent event monitoring showing runs of SVT as well as runs of A. fib.  She was placed on Xarelto.  She was seen in clinic in 02/2018 and noted to be in A. fib with RVR despite beta-blocker and calcium channel blocker therapy.  In this setting, she was placed on amiodarone with plans for outpatient DCCV.  However, she was admitted with a CHF exacerbation in the context of A. fib with RVR in 03/2018.  She was cardioverted at that time, though had recurrent A. fib and heart failure in 04/2018 requiring repeat amiodarone and DCCV.     She was admitted to the hospital in 10/2020 with acute respiratory failure and hypoxia in the context of multifocal pneumonia treated with antibiotics, steroids, and nebulizers.  Echo at that time showed an EF of 60 to 65%, no regional wall motion  abnormalities, grade 1 diastolic dysfunction, normal RV systolic function and ventricular cavity size, moderately elevated PASP estimated at 54.8 mmHg, and mild to moderate mitral valve regurgitation with 2 jets noted.   She was admitted to the hospital in 07/2021 with near syncope and possible orthostatic hypotension.  She had been in severe pain that related to what was felt to be herpes zoster.  Cardiac enzymes were negative.  BNP 145.  Carotid artery ultrasound showed no hemodynamically significant stenosis bilaterally.  Echo showed an EF of 55 to 60%, no regional wall motion abnormalities, moderate LVH, grade 1 diastolic dysfunction, normal RV systolic function and ventricular cavity size, normal PASP, mild mitral valve regurgitation, mild to moderate tricuspid regurgitation, mild aortic valve insufficiency, mild to moderate aortic valve sclerosis without evidence of stenosis, and an estimated right atrial pressure of 3 mmHg.  She was evaluated by EP with symptoms felt to be related to known orthostatic intolerance and exacerbated by pain from her presumed zoster infection.  It was felt bradycardia was not contributing to her presentation.  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  There was no indication for pacing.  With regards to her symptoms and known LBBB, given this was not abrupt onset and offset this was felt to be unlikely contributing with recommendation for loop recorder if symptoms recur.  EP recommended discontinuation of metoprolol.  At time of discharge, primary service held  amiodarone.   She was seen in follow-up on 08/03/2021 and was doing well from a cardiac perspective.  She has not had any further presyncopal episodes.  Her discomfort from shingles persisted, though was improving.  She felt like she was improving.  Amiodarone was restarted in an effort to maintain sinus rhythm.  It was recommended that she continue to hold metoprolol given prior  bradycardia and first-degree AV block.  She was last seen in the office on 10/03/2021 and was doing well from a cardiac perspective.  No further near syncopal episodes.  Blood pressure ranged from the 175Z to 025E systolic with an occasional reading in the 527P systolic.  She did note some lightheadedness if her blood pressure dropped to around 824 systolic.  To further evaluate her mitral regurgitation, she underwent TEE on 10/19/2021 which demonstrated an EF of 55 to 60%, normal RV systolic function and ventricular cavity size, mildly dilated left atrium without evidence of left atrial or left atrial appendage thrombus, mild mitral regurgitation, and trivial aortic insufficiency.  Today, BP is high, says at home it's 130/62. She is on amlodipine 2.5mg  daily and Cardura in the evening. They previously stopped BP meds bc it was dropping so low. Previously on metoprolol and valsartan. Takes BP at 4pm, 145/60s. No chest pain or shortness of breath. Has mild congestion. No fever or chills. She had the Flu vaccine.    Labs independently reviewed: 09/2021 - potassium 4.1, BUN 13, SCr 0.71, HGB 13.7, PLT 190 08/2021 - albumin 4.8, AST 41, ALT 36, TC 148, TG 71, HDL 75, LDL 58 07/2021 - TSH normal 11/2020 - magnesium 2.5 10/2020 - albumin 2.6, AST/ALT normal 08/2020 - A1c 5.2  Past Medical History:  Diagnosis Date   Anxiety    Bronchitis    Chronic combined systolic (congestive) and diastolic (congestive) heart failure ([place])    a. 02/2018 Echo: EF 45-50%, diff HK, triv AI, mod MR, mildly dil LA/RA, nl RV fxn, mod TR. PASP 40-67mmHg.   Chronic kidney disease    DJD (degenerative joint disease), cervical    History of stress test    a. 01/2008 MV: EF 76%. Fair ex tol. No ischemia.   Hyperlipidemia    Hypertension    Hypothyroid    LBBB (left bundle branch block)    Leg pain    a. 01/2018 ABI's wnl.   Lumbar spondylosis    Persistent atrial fibrillation ([place])    a. 01/2018 Event monitor: 45 runs of  SVT, longest 14.5 sec. SVT felt to be Afib/flutter-->2% burden. Longest run of AF 4h 58m (CHA2DS2VASc = 5-->Xarelto); b. 03/2019 s/p DCCV; c. 04/2019 Repeat Amio load and DCCV.   SVT (supraventricular tachycardia) ([place])    a. AVNRT - s/p ablation by Dr Lovena Le 5/13    Past Surgical History:  Procedure Laterality Date   BACK SURGERY  07/2019   CARDIOVERSION N/A 03/26/2018   Procedure: CARDIOVERSION;  Surgeon: Nelva Bush, MD;  Location: [facility] ORS;  Service: Cardiovascular;  Laterality: N/A;   CARDIOVERSION N/A 04/24/2018   Procedure: CARDIOVERSION;  Surgeon: Minna Merritts, MD;  Location: [facility] ORS;  Service: Cardiovascular;  Laterality: N/A;   CATARACT EXTRACTION W/PHACO Right 07/12/2021   Procedure: CATARACT EXTRACTION PHACO AND INTRAOCULAR LENS PLACEMENT (IOC) RIGHT 16.92 01:30.8 ;  Surgeon: Birder Robson, MD;  Location: [place];  Service: Ophthalmology;  Laterality: Right;   ELECTROPHYSIOLOGY STUDY N/A 02/27/2012   Procedure: ELECTROPHYSIOLOGY STUDY;  Surgeon: Evans Lance, MD;  Location[facility] CATH  LAB;  Service: Cardiovascular;  Laterality: N/A;   EPS and ablation for SVT  5/13   slow pathway ablation by Dr Lovena Le   EYE SURGERY     surgery for detatched retina   NECK SURGERY  02/2020   SUPRAVENTRICULAR TACHYCARDIA ABLATION N/A 02/27/2012   Procedure: SUPRAVENTRICULAR TACHYCARDIA ABLATION;  Surgeon: Evans Lance, MD;  Location: [facility] CATH LAB;  Service: Cardiovascular;  Laterality: N/A;   TEE WITHOUT CARDIOVERSION N/A 10/19/2021   Procedure: TRANSESOPHAGEAL ECHOCARDIOGRAM (TEE);  Surgeon: Kate Sable, MD;  Location: [facility] ORS;  Service: Cardiovascular;  Laterality: N/A;    Current Medications: No outpatient medications have been marked as taking for the 10/27/21 encounter (Appointment) with Rise Mu, PA-C.    Allergies:   Chocolate, Penicillins, Codeine, Erythromycin, and Septra [sulfamethoxazole-trimethoprim]   Social History   Socioeconomic History   Marital  status: Widowed    Spouse name: Not on file   Number of children: 1   Years of education: Not on file   Highest education level: 12th grade  Occupational History   Occupation: Retired  Tobacco Use   Smoking status: Never   Smokeless tobacco: Never   Tobacco comments:    smoking cessation materials not required  Vaping Use   Vaping Use: Never used  Substance and Sexual Activity   Alcohol use: No   Drug use: No   Sexual activity: Not Currently    Partners: Male    Birth control/protection: Post-menopausal  Other Topics Concern   Not on file  Social History Narrative   Lives in [place], she has a boyfriend   She has cats, some inside, some outside    Social Determinants of Health   Financial Resource Strain: Low Risk    Difficulty of Paying Living Expenses: Not hard at all  Food Insecurity: No Food Insecurity   Worried About Charity fundraiser in the Last Year: Never true   Arboriculturist in the Last Year: Never true  Transportation Needs: No Transportation Needs   Lack of Transportation (Medical): No   Lack of Transportation (Non-Medical): No  Physical Activity: Inactive   Days of Exercise per Week: 0 days   Minutes of Exercise per Session: 0 min  Stress: No Stress Concern Present   Feeling of Stress : Only a little  Social Connections: Moderately Integrated   Frequency of Communication with Friends and Family: More than three times a week   Frequency of Social Gatherings with Friends and Family: More than three times a week   Attends Religious Services: More than 4 times per year   Active Member of [employer] or Organizations: Yes   Attends Archivist Meetings: More than 4 times per year   Marital Status: Widowed     Family History:  The patient's family history includes Alzheimer's disease in her maternal grandfather and mother; Breast cancer in her father and paternal aunt; Heart disease in her father; Kidney cancer in her maternal grandmother; Stroke in  her father and paternal grandfather; Thyroid disease in her paternal grandmother.  ROS:   ROS   EKGs/Labs/Other Studies Reviewed:    Studies reviewed were summarized above. The additional studies were reviewed today:  TEE 10/19/2021: 1. Left ventricular ejection fraction, by estimation, is 55 to 60%. The  left ventricle has normal function.   2. Right ventricular systolic function is normal. The right ventricular  size is normal.   3. Left atrial size was mildly dilated. No left atrial/left atrial  appendage thrombus was detected.   4. The mitral valve is normal in structure. Mild mitral valve  regurgitation.   5. The aortic valve is tricuspid. Aortic valve regurgitation is trivial. __________  2D echo 07/24/2021: 1. Left ventricular ejection fraction, by estimation, is 55 to 60%. The  left ventricle has normal function. The left ventricle has no regional  wall motion abnormalities. There is moderate left ventricular hypertrophy.  Left ventricular diastolic  parameters are consistent with Grade I diastolic dysfunction (impaired  relaxation).   2. Right ventricular systolic function is normal. The right ventricular  size is normal. There is normal pulmonary artery systolic pressure.   3. The mitral valve is abnormal. Mild mitral valve regurgitation. No  evidence of mitral stenosis.   4. Tricuspid valve regurgitation is mild to moderate.   5. The aortic valve is tricuspid. There is mild calcification of the  aortic valve. There is mild thickening of the aortic valve. Aortic valve  regurgitation is mild. Mild to moderate aortic valve  sclerosis/calcification is present, without any evidence  of aortic stenosis.   6. The inferior vena cava is normal in size with greater than 50%  respiratory variability, suggesting right atrial pressure of 3 mmHg. __________   2D echo 10/31/2020: 1. Left ventricular ejection fraction, by estimation, is 60 to 65%. The  left ventricle has normal  function. The left ventricle has no regional  wall motion abnormalities. Left ventricular diastolic parameters are  consistent with Grade I diastolic  dysfunction (impaired relaxation).   2. Right ventricular systolic function is normal. The right ventricular  size is normal. There is moderately elevated pulmonary artery systolic  pressure. The estimated right ventricular systolic pressure is 27.0 mmHg.   3. The mitral valve is normal in structure. Mild to moderate mitral valve  regurgitation. Two jets. No evidence of mitral stenosis. __________   2D echo 03/21/2018: - Left ventricle: The cavity size was normal. Wall thickness was    increased in a pattern of mild LVH. Systolic function was mildly    reduced. The estimated ejection fraction was in the range of 45%    to 50%. Diffuse hypokinesis.  - Aortic valve: There was trivial regurgitation.  - Mitral valve: Mildly calcified annulus. There was moderate    regurgitation directed posteriorly.  - Left atrium: The atrium was mildly dilated.  - Right ventricle: The cavity size was normal. Systolic function    was normal.  - Right atrium: The atrium was mildly dilated.  - Tricuspid valve: There was moderate regurgitation.  - Pulmonary arteries: Systolic pressure was mildly to moderately    increased, in the range of 40 mm Hg to 45 mm Hg.  - Inferior vena cava: The vessel was normal in size. The    respirophasic diameter changes were blunted (< 50%), consistent    with elevated central venous pressure.  - Pericardium, extracardiac: A trivial pericardial effusion was    identified.  __________   Elwyn Reach patch 01/2018: Normal sinus rhythm min HR of 30 bpm, max HR of 179 bpm, and avg HR of 66 bpm.   Bundle Branch Block/IVCD was present.   45 Supraventricular Tachycardia runs occurred, the run with the fastest interval lasting 13 beats with a max rate of 179 bpm, the longest lasting 14.5 secs with an avg rate of 96 bpm.    Some Episodes  of Supraventricula Tachycardia May be short burst of Atrial fibrillation/Flutter.    Atrial Fibrillation occurred (2% burden), ranging from 56-129  bpm (avg of 79 bpm), the longest lasting 4 hours 24 mins with an avg rate of 79 bpm.    Second Degree AV Block-Mobitz I (Wenckebach) was present.    Atrial Fibrillation was detected within +/- 45 seconds of symptomatic patient event(s).   Other patient triggers with no arrhythmia noted   Isolated SVEs were rare (<1.0%), and no SVE Couplets or SVE Triplets were present. Isolated VEs were frequent (7.9%, 81203), VE Couplets were rare (<1.0%, 1332), and VE Triplets were rare (<1.0%, 71). Ventricular Bigeminy and Trigeminy were Present. __________   Carlton Adam MPI 02/01/2008: 1.     Fair exercise tolerance with no arrhythmia or ischemia.  2.     Normal LV function at 76% with no transient LV dilatation or  regional  wall motion abnormality.  3.     Fixed septal defect consistent with LEFT bundle branch block. No  reversible ischemia noted.   EKG:  EKG is ordered today.  The EKG ordered today demonstrates NSR, 66bpm, LAD, LBBB, no changes  Recent Labs: 11/24/2020: Magnesium 2.5 07/23/2021: B Natriuretic Peptide 145.5; TSH 1.583 09/14/2021: ALT 36 10/06/2021: BUN 13; Creatinine, Ser 0.71; Hemoglobin 13.7; Platelets 190; Potassium 4.1; Sodium 135  Recent Lipid Panel    Component Value Date/Time   CHOL 148 09/14/2021 0920   CHOL 164 06/06/2016 0907   TRIG 71 09/14/2021 0920   HDL 75 09/14/2021 0920   HDL 54 06/06/2016 0907   CHOLHDL 2.0 09/14/2021 0920   VLDL 32 (H) 03/12/2017 0815   LDLCALC 58 09/14/2021 0920    PHYSICAL EXAM:    VS:  There were no vitals taken for this visit.  BMI: There is no height or weight on file to calculate BMI.  Physical Exam Constitutional:      Appearance: She is well-developed.  HENT:     Head: Normocephalic and atraumatic.  Eyes:     General:        Right eye: No discharge.        Left eye: No  discharge.  Neck:     Vascular: No JVD.  Cardiovascular:     Rate and Rhythm: Normal rate and regular rhythm.     Pulses:          Posterior tibial pulses are 2+ on the right side and 2+ on the left side.     Heart sounds: S1 normal and S2 normal. Heart sounds not distant. No midsystolic click and no opening snap. Murmur heard.  High-pitched blowing holosystolic murmur is present with a grade of 2/6 at the apex.    No friction rub.  Pulmonary:     Effort: Pulmonary effort is normal. No respiratory distress.     Breath sounds: Normal breath sounds. No decreased breath sounds, wheezing or rales.  Chest:     Chest wall: No tenderness.  Abdominal:     General: There is no distension.     Palpations: Abdomen is soft.     Tenderness: There is no abdominal tenderness.  Musculoskeletal:     Cervical back: Normal range of motion.     Right lower leg: No edema.     Left lower leg: No edema.  Skin:    General: Skin is warm and dry.     Nails: There is no clubbing.  Neurological:     Mental Status: She is alert and oriented to person, place, and time.  Psychiatric:        Speech: Speech normal.  Behavior: Behavior normal.        Thought Content: Thought content normal.        Judgment: Judgment normal.   Wt Readings from Last 3 Encounters:  10/19/21 122 lb (55.3 kg)  10/03/21 121 lb 4 oz (55 kg)  09/14/21 117 lb (53.1 kg)     ASSESSMENT & PLAN:   Near syncope: no further presyncopal episodes. Suspected from orthostasis exacerbated by increased vagal tone in the context of pain from herpes zoster. Should she have recurrent episodes, can consider referral to EP for loop recorder.   PAF: She is in NSR today by EKG. Not on BB due to bradycardia and 1st degree AV block. EKG today shows normal PRI. Continue Eliquis 5mg  BID.   Mitral regurgitation:TEE showed normal LVEF and mild MR. Echo reviewed briefly in the visit. Can follow with serial echocardiograms.  Chronic combined  systolic and diastolic CHF: Patient has trace lower leg edema on exam. She is on lasix 20mg  daily. BB previously stopped for bradycardia. Orthostasis precludes GDMT.  LBBB: chronic and stable  First-degree AV block: Resolved off BB. PRI 126ms by EKG  HTN: Blood pressure high today, however patient says it's better at home. Re-check was also high. At home she reports SBP 130-145/60-70. She is on amlodipine 2.5mg  daily and Cardura 1mg  daily. Reports that in the past she was on Valsartan, metoprolol, and higher dose amlodipine, however these were stopped for hypotension. Will need to be cautious with titration of medications. I will increase amlodipine to 5mg  daily. She will continue to check BP at home.   HLD: LDL 53 in 08/2021.  Abnormal LFT: Noted on most recent labs by outside office.  PCP monitoring.     Disposition: F/u with Dr. Rockey Situ or an APP in 2 months.   Medication Adjustments/Labs and Tests Ordered: Current medicines are reviewed at length with the patient today.  Concerns regarding medicines are outlined above. Medication changes, Labs and Tests ordered today are summarized above and listed in the Patient Instructions accessible in Encounters.   Signed, Christell Faith, PA-C 10/20/2021 2:02 PM     [place] [address] [place] [address] [place], [place] [address] [phone]

## 2021-10-25 ENCOUNTER — Other Ambulatory Visit: Payer: Self-pay | Admitting: Family Medicine

## 2021-10-25 ENCOUNTER — Other Ambulatory Visit: Payer: Self-pay | Admitting: Cardiovascular Disease

## 2021-10-25 DIAGNOSIS — E039 Hypothyroidism, unspecified: Secondary | ICD-10-CM

## 2021-10-27 ENCOUNTER — Other Ambulatory Visit: Payer: Self-pay

## 2021-10-27 ENCOUNTER — Encounter: Payer: Self-pay | Admitting: Medical

## 2021-10-27 ENCOUNTER — Ambulatory Visit: Payer: Medicare Other | Admitting: Medical

## 2021-10-27 VITALS — BP 184/70 | HR 66 | Ht 66.0 in | Wt 122.0 lb

## 2021-10-27 DIAGNOSIS — I5032 Chronic diastolic (congestive) heart failure: Secondary | ICD-10-CM

## 2021-10-27 DIAGNOSIS — I10 Essential (primary) hypertension: Secondary | ICD-10-CM | POA: Diagnosis not present

## 2021-10-27 DIAGNOSIS — I480 Paroxysmal atrial fibrillation: Secondary | ICD-10-CM

## 2021-10-27 MED ORDER — AMLODIPINE BESYLATE 5 MG PO TABS: 5.0000 mg | ORAL_TABLET | Freq: Every day | ORAL | 2 refills | Status: DC

## 2021-10-27 NOTE — Patient Instructions (Signed)
Medication Instructions:   Your physician has recommended you make the following change in your medication:   INCREASE Amlodipine 5 mg daily - A new Rx has been sent to your pharmacy. You may take TWO tablets of your current dose for total of 5 mg until you pick up new Rx.   *If you need a refill on your cardiac medications before your next appointment, please call your pharmacy*   Lab Work:  None ordered  Testing/Procedures:  None ordered   Follow-Up: At [facility], you and your health needs are our priority.  As part of our continuing mission to provide you with exceptional heart care, we have created designated Provider Care Teams.  These Care Teams include your primary Cardiologist (physician) and Advanced Practice Providers (APPs -  Physician Assistants and Nurse Practitioners) who all work together to provide you with the care you need, when you need it.  We recommend signing up for the patient portal called "MyChart".  Sign up information is provided on this After Visit Summary.  MyChart is used to connect with patients for Virtual Visits (Telemedicine).  Patients are able to view lab/test results, encounter notes, upcoming appointments, etc.  Non-urgent messages can be sent to your provider as well.   To learn more about what you can do with MyChart, go to [email or web address].    Your next appointment:   2 month(s)  The format for your next appointment:   In Person  Provider:   You may see Ida Rogue, MD or one of the following Advanced Practice Providers on your designated Care Team:   Murray Hodgkins, NP Christell Faith, PA-C Cadence Kathlen Mody, [place]   Other Instructions  Please monitor your blood pressure at home.

## 2021-10-28 NOTE — Telephone Encounter (Signed)
This encounter was created in error - please disregard.

## 2021-11-09 ENCOUNTER — Other Ambulatory Visit: Payer: Self-pay | Admitting: Family Medicine

## 2021-11-09 DIAGNOSIS — F411 Generalized anxiety disorder: Secondary | ICD-10-CM

## 2021-11-17 ENCOUNTER — Telehealth: Payer: Self-pay

## 2021-11-17 NOTE — Progress Notes (Signed)
Chronic Care Management Pharmacy Assistant   Name: [name]  MRN: [id number] DOB: Aug 03, 1941  Reason for Encounter: Hypertension Disease State Call   Recent office visits:  09/14/2021 Steele Sizer, MD (PCP Office Visit) for Follow-up- Stopped: Gabapentin 300 mg, lab orders placed, patient instructed to follow-up in 4 months  Recent consult visits:  10/27/2021 Fabienne Bruns PA-C (Cardiology) for Follow-up- Changed: Amlodipine Besylate 5 mg daily from 2.5 mg daily, EKG 12-Lead ordered,   10/03/2021 Christell Faith, PA-C (Cardiology) for Near Syncope- Stopped: Valacyclovir HCl 1000 mg due to patient not taking, lab orders placed, EKG 1-Lead order placed, Patient to follow-up in 1 month  Hospital visits:  Medication Reconciliation was completed by comparing discharge summary, patients EMR and Pharmacy list, and upon discussion with patient.  Admitted to the hospital on 10/19/2021 due to TEE. Discharge date was 10/19/2021. Discharged from [place]?Medications Started at [facility] Discharge:?? -started None ID  Medication Changes at Hospital Discharge: -Changed None ID  Medications Discontinued at Hospital Discharge: -Stopped None ID  Medications that remain the same after Hospital Discharge:??  -All other medications will remain the same.    Medications: Outpatient Encounter Medications as of 11/17/2021  Medication Sig   busPIRone (BUSPAR) 5 MG tablet TAKE 1 TABLET(5 MG) BY MOUTH TWICE DAILY   acetaminophen (TYLENOL) 650 MG CR tablet Take 650-1,300 mg by mouth every 8 (eight) hours as needed for pain.    amiodarone (PACERONE) 200 MG tablet Take 1 tablet (200 mg total) by mouth daily.   amLODipine (NORVASC) 5 MG tablet Take 1 tablet (5 mg total) by mouth daily.   BINAXNOW COVID-19 AG HOME TEST KIT Use as Directed on the Package   cholecalciferol (VITAMIN D) 1000 UNITS tablet Take 2,000 Units by mouth daily.   Cranberry 500 MG TABS  Take 2 tablets by mouth daily.   doxazosin (CARDURA) 1 MG tablet TAKE 1 TABLET(1 MG) BY MOUTH EVERY EVENING   ELIQUIS 5 MG TABS tablet TAKE 1 TABLET(5 MG) BY MOUTH TWICE DAILY   furosemide (LASIX) 20 MG tablet Take 20 mg by mouth.   levothyroxine (SYNTHROID) 137 MCG tablet TAKE 1 TABLET(137 MCG) BY MOUTH DAILY BEFORE BREAKFAST   loratadine (CLARITIN) 10 MG tablet Take 10 mg by mouth daily as needed for allergies or rhinitis.   Melatonin 10 MG TABS Take 10 mg by mouth at bedtime as needed.   Multiple Vitamin (MULITIVITAMIN WITH MINERALS) TABS Take 1 tablet by mouth daily.   potassium chloride (KLOR-CON) 10 MEQ tablet TAKE 1 TABLET BY MOUTH EVERY DAY AS DIRECTED. MAY TAKE AN EXTRA TABLET AFTER LUNCH WITH FLUID TABLET AS NEEDED FOR SWELLING   rosuvastatin (CRESTOR) 40 MG tablet TAKE 1 TABLET(40 MG) BY MOUTH DAILY   temazepam (RESTORIL) 15 MG capsule TAKE 1 CAPSULE(15 MG) BY MOUTH AT BEDTIME AS NEEDED FOR SLEEP   No facility-administered encounter medications on file as of 11/17/2021.   Care Gaps: Zoster Vaccine COVID-19 Booster 4  Star Rating Drugs: Rosuvastatin 40 mg last filled on 09/28/2021 for a 90-Day supply with [facility]  Reviewed chart prior to disease state call. Spoke with patient regarding BP  Recent Office Vitals: BP Readings from Last 3 Encounters:  10/27/21 (!) 184/70  10/19/21 (!) 169/55  10/03/21 (!) 180/70   Pulse Readings from Last 3 Encounters:  10/27/21 66  10/19/21 (!) 58  10/03/21 70    Wt Readings from Last 3 Encounters:  10/27/21 122 lb (55.3  kg)  10/19/21 122 lb (55.3 kg)  10/03/21 121 lb 4 oz (55 kg)     Kidney Function Lab Results  Component Value Date/Time   CREATININE 0.71 10/06/2021 08:46 AM   CREATININE 1.09 (H) 09/14/2021 09:20 AM   CREATININE 0.69 07/24/2021 03:51 AM   CREATININE 0.89 12/01/2020 04:18 PM   GFRNONAA >60 10/06/2021 08:46 AM   GFRNONAA 62 12/01/2020 04:18 PM   GFRAA 72 12/01/2020 04:18 PM    BMP Latest Ref Rng &  Units 10/06/2021 09/14/2021 07/24/2021  Glucose 70 - 99 mg/dL 125(H) 118(H) 114(H)  BUN 8 - 23 mg/dL _0 Creatinine 0.44 - 1.00 mg/dL 0.71 1.09(H) 0.69  BUN/Creat Ratio 6 - 22 (calc) - 16 -  Sodium 135 - 145 mmol/L 135 141 137  Potassium 3.5 - 5.1 mmol/L 4.1 5.1 3.7  Chloride 98 - 111 mmol/L 102 103 103  CO2 22 - 32 mmol/L _1 Calcium 8.9 - 10.3 mg/dL 9.2 10.0 8.9    Current antihypertensive regimen:  Amlodipine 5 mg 1 tablet daily Amiodarone 200 mg 1 tablet daily  How often are you checking your Blood Pressure? 3-5x per week  Current home BP readings: Patient wasn't able to give me the current reading but she stated her numbers have been better since they started her back on the 5 mg of Amlodipine. She reports that she has been following up with Cardio regularly and they advised they rather her numbers be a little higher due to the lows she was getting.   What recent interventions/DTPs have been made by any provider to improve Blood Pressure control since last CPP Visit: Cardiologist increased patient's Amlodipine to 5 mg daily due to elevated BP at visit Any recent hospitalizations or ED visits since last visit with CPP? Yes  What diet changes have been made to improve Blood Pressure Control?  Patient just watches her sodium and salt intake  What exercise is being done to improve your Blood Pressure Control?  Patient does walk when she can  Adherence Review: Is the patient currently on ACE/ARB medication? No Does the patient have >5 day gap between last estimated fill dates? No  Patient reports she is doing well. Patient stated she does have a head cold, denies fever but reports it's getting better. She denies any other ill symptoms at this time. Patient stated that she is taking all medications as directed. She does take her blood pressure, and she reports her numbers are better since the increase of her Amlodipine. She denies any abnormal lows. Patient does not need  any refills at this time. She is still okay with her upcoming appointment with CPP. She request I still give her a reminder call the day prior.   Patient has a telephone appointment with Junius Argyle, CPP on 11/23/2021 @ [place], CPA/CMA Catering manager Phone: [phone]

## 2021-11-22 ENCOUNTER — Telehealth: Payer: Self-pay

## 2021-11-22 NOTE — Progress Notes (Signed)
° ° °  Chronic Care Management Pharmacy Assistant   Name: [name]  MRN: [id number] DOB: 1941/08/04  Patient called to be reminded of her telephone appointment with Junius Argyle, CPP on 11/23/2021 @ 1015.  Patient aware of appointment date, time, and type of appointment (either telephone or in person). Patient aware to have/bring all medications, supplements, blood pressure and/or blood sugar logs to visit.  Questions: Are there any concerns you would like to discuss during your office visit? Nothing at this time  Are you having any problems obtaining your medications? No issues  Care Gaps: Zoster Vaccine COVID-19 Booster 4   Star Rating Drugs: Rosuvastatin 40 mg last filled on 09/28/2021 for a 90-Day supply with [facility]  Any gaps in medications fill history? No   Lynann Bologna, CPA/CMA Clinical Pharmacist Assistant Phone: [phone]

## 2021-11-23 ENCOUNTER — Ambulatory Visit (INDEPENDENT_AMBULATORY_CARE_PROVIDER_SITE_OTHER): Payer: Medicare Other

## 2021-11-23 DIAGNOSIS — I480 Paroxysmal atrial fibrillation: Secondary | ICD-10-CM

## 2021-11-23 DIAGNOSIS — F409 Phobic anxiety disorder, unspecified: Secondary | ICD-10-CM

## 2021-11-23 DIAGNOSIS — I10 Essential (primary) hypertension: Secondary | ICD-10-CM

## 2021-11-23 NOTE — Patient Instructions (Signed)
Visit Information It was great speaking with you today!  Please let me know if you have any questions about our visit.   Goals Addressed             This Visit's Progress    Track and Manage Fluids and Swelling-Heart Failure   On track    Timeframe:  Long-Range Goal Priority:  High Start Date:  03/10/2021                            Expected End Date: 09/10/2021                       Follow Up within 90 days    - call office if I gain more than 2 pounds in one day or 5 pounds in one week - keep legs up while sitting - track weight in diary - use salt in moderation - watch for swelling in feet, ankles and legs every day    Why is this important?   It is important to check your weight daily and watch how much salt and liquids you have.  It will help you to manage your heart failure.    Notes:         Patient Care Plan: General Pharmacy (Adult)     Problem Identified: Hypertension, Hyperlipidemia, Atrial Fibrillation, Heart Failure, Coronary Artery Disease, Chronic Kidney Disease, Hypothyroidism, Depression, Anxiety and Peripheral Artery Disease   Priority: High     Long-Range Goal: Patient-Specific Goal   Start Date: 03/10/2021  Expected End Date: 09/09/2022  This Visit's Progress: On track  Recent Progress: On track  Priority: High  Note:   Current Barriers:  No barriers noted  Pharmacist Clinical Goal(s):  Patient will maintain control of blood pressure as evidenced by BP less than 140/90  through collaboration with PharmD and provider.   Interventions: 1:1 collaboration with Steele Sizer, MD regarding development and update of comprehensive plan of care as evidenced by provider attestation and co-signature Inter-disciplinary care team collaboration (see longitudinal plan of care) Comprehensive medication review performed; medication list updated in electronic medical record  Heart Failure (Goal: manage symptoms and prevent exacerbations) -Controlled -Last  ejection fraction: 60-65% (Date: 10/31/2020) -HF type: Diastolic -NYHA Class: II (slight limitation of activity) -AHA HF Stage: C (Heart disease and symptoms present) -Current treatment: Amlodipine 5 mg daily: Appropriate, Effective, Safe, Accessible Doxazosin 1 mg nightly: Appropriate, Effective, Safe, Accessible  Furosemide 20 mg daily: Appropriate, Effective, Safe, Accessible  -Medications previously tried: NA  -Current home BP/HR readings: 135/62, highest in recall was 140s/60s   -Educated on Importance of blood pressure control -Recommended to continue current medication  Atrial Fibrillation (Goal: prevent stroke and major bleeding) -Controlled -CHADSVASC: 6 -Current treatment: Rate/Rhthym control: Amiodarone 200 mg daily: Appropriate, Effective, Safe, Accessible  Anticoagulation: Eliquis 5 mg twice daily: Appropriate, Effective, Safe, Accessible  -Medications previously tried: NA -Patient reports only 1 instance of A-Fib in past 1-2 months. -Recommended to continue current medication  Hyperlipidemia: (LDL goal < 70) -Controlled -Current treatment: Rosuvastatin 40 mg daily  -Medications previously tried: NA  -Educated on Importance of limiting foods high in cholesterol; -Recommended to continue current medication  Depression/Anxiety (Goal: Maintain stable mood) -Controlled -Current treatment: Buspirone 5 mg twice daily   -Medications previously tried/failed: NA -PHQ9: 0 -GAD7: 0 -Educated on Benefits of medication for symptom control -Recommended to continue current medication  Insomnia (Goal: Improve sleep quality) -Controlled -Current treatment  Melatonin 10 nightly (twice weekly): Appropriate, Effective, Safe, Accessible  Temazepam 15 mg nightly as needed: Appropriate, Effective, Safe, Accessible  -Medications previously tried: NA  - -Patient reports her sleep quality as very good. Does need to take temazepam every other night, but denies any lingering sedation  or side effects from treatment. -Recommended to continue current medication  Hypothyroidism (Goal: Maintain stable thyroid functon) -Controlled -Current treatment  Levothyroxine 137 mcg daily  -Medications previously tried: NA -Patient reports concerns with hair loss, wonders if it could be an iron deficiency   separates from other medications and food  -Recommended to continue current medication Will plan to have PCP recheck thyroid and iron function at next follow-up.  Could consider biotin or B complex supplementation.   Depression/Anxiety (Goal: Achieve symptom remission) -Controlled -Current treatment: Buspirone 5 mg twice daily  Temazepam 15 mg nightly as needed  -Medications previously tried/failed: NA -GAD7: 0 -Recommended to continue current medication  Patient Goals/Self-Care Activities Patient will:  - check blood pressure 2-3 times weekly, document, and provide at future appointments  Follow Up Plan: Telephone follow up appointment with care management team member scheduled for:  05/24/2022 at 9:15 AM      Patient agreed to services and verbal consent obtained.   Patient verbalizes understanding of instructions and care plan provided today and agrees to view in [place]. Active MyChart status confirmed with patient.    Malva Limes, [place] Pharmacist Practitioner  [facility] [phone]

## 2021-11-23 NOTE — Progress Notes (Signed)
Chronic Care Management Pharmacy Note  11/23/2021 Name:  [name] MRN:  [id number] DOB:  13-Mar-1941  Summary: Patient presents for CCM follow-up.  Recommendations/Changes made from today's visit: Continue current medications  Plan: CPP follow-up 6 months   Subjective: [name] is an 81 y.o. year old female who is a primary patient of Steele Sizer, MD.  The CCM team was consulted for assistance with disease management and care coordination needs.    Engaged with patient by telephone for follow up visit in response to provider referral for pharmacy case management and/or care coordination services.   Consent to Services:  The patient was given information about Chronic Care Management services, agreed to services, and gave verbal consent prior to initiation of services.  Please see initial visit note for detailed documentation.   Patient Care Team: Steele Sizer, MD as PCP - General (Family Medicine) Minna Merritts, MD as PCP - Cardiology (Cardiology) Dingeldein, Remo Lipps, MD as Consulting Physician (Ophthalmology) Alisa Graff, FNP as Consulting Physician (Family Medicine) Ree Edman, MD (Dermatology) Minna Merritts, MD as Consulting Physician (Cardiology) Ashok Pall, MD as Consulting Physician (Neurosurgery) Marlaine Hind, MD as Consulting Physician (Physical Medicine and Rehabilitation) Billey Co, MD as Consulting Physician (Urology)  Recent office visits: 09/14/21: Patient presented to Dr. Ancil Boozer for follow-up. Gabapentin stopped.   08/02/21: Patient presented to Serafina Royals, FNP for post-herpetic neuralgia. Gabapentin increased to 300 mg TID   Recent consult visits: 10/27/20: Patient presented to Pollock, PA-C (Cardiology). BP 184/70, Amlodipine increased to 5 mg daily  10/03/21: Patient presented to Christell Faith, PA-C (Cardiology) for follow-up. 08/03/21: Patient presented to Christell Faith, PA-C (Cardiology) for hospital  follow-up. Amiodarone 200 mg daily.   Hospital visits: 10/1-10/3: Patient hospitalized for orthostatic hypotension/ syncope. Amiodarone on hold.  1/28-11/24/20: Patient hospitalized for CAP. Patient discharged on levofloxacin x 2 days.  1/8-1/11/22: Patient hospitalized for CAP. Patient discharged on prednisone x2 additional days + levofloxacin x 2 days.    Objective:  Lab Results  Component Value Date   CREATININE 0.71 10/06/2021   BUN 13 10/06/2021   GFRNONAA >60 10/06/2021   GFRAA 72 12/01/2020   NA 135 10/06/2021   K 4.1 10/06/2021   CALCIUM 9.2 10/06/2021   CO2 24 10/06/2021   GLUCOSE 125 (H) 10/06/2021    Lab Results  Component Value Date/Time   HGBA1C 5.2 09/13/2020 12:00 AM   HGBA1C 5.0 03/30/2020 04:12 AM    Last diabetic Eye exam: No results found for: HMDIABEYEEXA  Last diabetic Foot exam: No results found for: HMDIABFOOTEX   Lab Results  Component Value Date   CHOL 148 09/14/2021   HDL 75 09/14/2021   LDLCALC 58 09/14/2021   TRIG 71 09/14/2021   CHOLHDL 2.0 09/14/2021    Hepatic Function Latest Ref Rng & Units 09/14/2021 11/21/2020 11/19/2020  Total Protein 6.1 - 8.1 g/dL 7.2 6.1(L) 7.5  Albumin 3.5 - 5.0 g/dL - 2.6(L) 3.0(L)  AST 10 - 35 U/L 41(H) 26 38  ALT 6 - 29 U/L 36(H) 21 29  Alk Phosphatase 38 - 126 U/L - 64 86  Total Bilirubin 0.2 - 1.2 mg/dL 0.6 0.5 0.7  Bilirubin, Direct 0.0 - 0.2 mg/dL - - -    Lab Results  Component Value Date/Time   TSH 1.583 07/23/2021 12:30 PM   TSH 0.852 10/30/2020 04:03 AM   TSH 5.09 (H) 09/13/2020 12:00 AM   TSH 1.20 06/02/2019 08:36 AM   FREET4 1.2 01/10/2017 08:25  AM   FREET4 1.51 07/22/2015 09:09 AM    CBC Latest Ref Rng & Units 10/06/2021 07/24/2021 07/23/2021  WBC 4.0 - 10.5 K/uL 6.1 6.4 5.9  Hemoglobin 12.0 - 15.0 g/dL 13.7 13.0 11.9(L)  Hematocrit 36.0 - 46.0 % 39.8 37.8 34.9(L)  Platelets 150 - 400 K/uL 190 158 153    Lab Results  Component Value Date/Time   VD25OH 46 05/17/2017 11:43 AM     Clinical ASCVD: Yes  The 10-year ASCVD risk score (Arnett DK, et al., 2019) is: 56.7%   Values used to calculate the score:     Age: 27 years     Sex: Female     Is Non-Hispanic African American: No     Diabetic: No     Tobacco smoker: No     Systolic Blood Pressure: 161 mmHg     Is BP treated: Yes     HDL Cholesterol: 75 mg/dL     Total Cholesterol: 148 mg/dL    Depression screen [facility] 2/9 09/14/2021 08/02/2021 05/26/2021  Decreased Interest 0 0 0  Down, Depressed, Hopeless 0 0 0  PHQ - 2 Score 0 0 0  Altered sleeping 0 - -  Tired, decreased energy 0 - -  Change in appetite 0 - -  Feeling bad or failure about yourself  0 - -  Trouble concentrating 0 - -  Moving slowly or fidgety/restless 0 - -  Suicidal thoughts 0 - -  PHQ-9 Score 0 - -  Difficult doing work/chores - - -  Some recent data might be hidden     Social History   Tobacco Use  Smoking Status Never  Smokeless Tobacco Never  Tobacco Comments   smoking cessation materials not required   BP Readings from Last 3 Encounters:  10/27/21 (!) 184/70  10/19/21 (!) 169/55  10/03/21 (!) 180/70   Pulse Readings from Last 3 Encounters:  10/27/21 66  10/19/21 (!) 58  10/03/21 70   Wt Readings from Last 3 Encounters:  10/27/21 122 lb (55.3 kg)  10/19/21 122 lb (55.3 kg)  10/03/21 121 lb 4 oz (55 kg)   BMI Readings from Last 3 Encounters:  10/27/21 19.69 kg/m  10/19/21 19.69 kg/m  10/03/21 19.57 kg/m    Assessment/Interventions: Review of patient past medical history, allergies, medications, health status, including review of consultants reports, laboratory and other test data, was performed as part of comprehensive evaluation and provision of chronic care management services.   SDOH:  (Social Determinants of Health) assessments and interventions performed: Yes SDOH Interventions    Flowsheet Row Most Recent Value  SDOH Interventions   Financial Strain Interventions Intervention Not Indicated         SDOH Screenings   Alcohol Screen: Low Risk    Last Alcohol Screening Score (AUDIT): 0  Depression (PHQ2-9): Low Risk    PHQ-2 Score: 0  Financial Resource Strain: Low Risk    Difficulty of Paying Living Expenses: Not hard at all  Food Insecurity: No Food Insecurity   Worried About Charity fundraiser in the Last Year: Never true   Ran Out of Food in the Last Year: Never true  Housing: Low Risk    Last Housing Risk Score: 0  Physical Activity: Inactive   Days of Exercise per Week: 0 days   Minutes of Exercise per Session: 0 min  Social Connections: Moderately Integrated   Frequency of Communication with Friends and Family: More than three times a week   Frequency of  Social Gatherings with Friends and Family: More than three times a week   Attends Religious Services: More than 4 times per year   Active Member of [employer] or Organizations: Yes   Attends Archivist Meetings: More than 4 times per year   Marital Status: Widowed  Stress: No Stress Concern Present   Feeling of Stress : Only a little  Tobacco Use: Low Risk    Smoking Tobacco Use: Never   Smokeless Tobacco Use: Never   Passive Exposure: Not on file  Transportation Needs: No Transportation Needs   Lack of Transportation (Medical): No   Lack of Transportation (Non-Medical): No    CCM Care Plan  Allergies  Allergen Reactions   Chocolate Swelling   Penicillins Swelling and Rash    Has patient had a PCN reaction causing immediate rash, facial/tongue/throat swelling, SOB or lightheadedness with hypotension: Yes Has patient had a PCN reaction causing severe rash involving mucus membranes or skin necrosis: No Has patient had a PCN reaction that required hospitalization: No Has patient had a PCN reaction occurring within the last 10 years: No If all of the above answers are "NO", then may proceed with Cephalosporin use.    Codeine Nausea And Vomiting   Erythromycin Itching and Swelling   Septra  [Sulfamethoxazole-Trimethoprim] Swelling    Medications Reviewed Today     Reviewed by Antony Madura, PA-C (Physician Assistant Certified) on 58/09/98 at 1439  Med List Status: <None>   Medication Order Taking? Sig Documenting Provider Last Dose Status Informant  acetaminophen (TYLENOL) 650 MG CR tablet [id number] Yes Take 650-1,300 mg by mouth every 8 (eight) hours as needed for pain.  [provider] Taking Active Self  amiodarone (PACERONE) 200 MG tablet [id number] Yes Take 1 tablet (200 mg total) by mouth daily. Rise Mu, PA-C Taking Active Self  amLODipine (NORVASC) 2.5 MG tablet [id number] Yes Take 1 tablet (2.5 mg total) by mouth daily. Debbe Odea, MD Taking Active Self  Ermelinda Das HOME TEST KIT [id number] Yes Use as Directed on the Package [provider] Taking Active Self  busPIRone (BUSPAR) 5 MG tablet [id number] Yes TAKE 1 TABLET(5 MG) BY MOUTH TWICE DAILY Sowles, Drue Stager, MD Taking Active Self  cholecalciferol (VITAMIN D) 1000 UNITS tablet [id number] Yes Take 2,000 Units by mouth daily. [provider] Taking Active Self  Cranberry 500 MG TABS [id number] Yes Take 2 tablets by mouth daily. [provider] Taking Active Self  doxazosin (CARDURA) 1 MG tablet [id number] Yes TAKE 1 TABLET(1 MG) BY MOUTH EVERY EVENING Gollan, Kathlene November, MD Taking Active   ELIQUIS 5 MG TABS tablet [id number] Yes TAKE 1 TABLET(5 MG) BY MOUTH TWICE DAILY Gollan, Kathlene November, MD Taking Active Self  furosemide (LASIX) 20 MG tablet [id number] Yes Take 20 mg by mouth. [provider] Taking Active Self  levothyroxine (SYNTHROID) 137 MCG tablet [id number] Yes TAKE 1 TABLET(137 MCG) BY MOUTH DAILY BEFORE Miachel Roux, DO Taking Active   loratadine (CLARITIN) 10 MG tablet [id number] Yes Take 10 mg by mouth daily as needed for allergies or rhinitis. [provider] Taking Active Self  Melatonin 10 MG TABS [id number] Yes Take 10 mg by mouth at  bedtime as needed. [provider] Taking Active Self  Multiple Vitamin (MULITIVITAMIN WITH MINERALS) TABS [id number] Yes Take 1 tablet by mouth daily. [provider] Taking Active Self  potassium chloride (KLOR-CON) 10 MEQ tablet [id number] Yes TAKE 1 TABLET BY MOUTH EVERY DAY AS DIRECTED. MAY TAKE  AN EXTRA TABLET AFTER LUNCH WITH FLUID TABLET AS NEEDED FOR SWELLING Gollan, Kathlene November, MD Taking Active Self  rosuvastatin (CRESTOR) 40 MG tablet [id number] Yes TAKE 1 TABLET(40 MG) BY MOUTH DAILY Steele Sizer, MD Taking Active Self  temazepam (RESTORIL) 15 MG capsule [id number] Yes TAKE 1 CAPSULE(15 MG) BY MOUTH AT BEDTIME AS NEEDED FOR SLEEP Steele Sizer, MD Taking Active             Patient Active Problem List   Diagnosis Date Noted   Orthostatic hypotension 07/24/2021   Bradycardia 07/24/2021   History of pyelonephritis    History of pneumonia 10/2020   Hydronephrosis of right kidney 03/29/2020   Depression 03/29/2020   Chronic diastolic CHF (congestive heart failure) ([place]) 05/24/2018   AF (paroxysmal atrial fibrillation) ([place]) 05/24/2018   CKD (chronic kidney disease), stage III ([place]) 05/17/2018   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   Pulmonary hypertension, unspecified ([place]) 04/05/2018   Paroxysmal sinus tachycardia ([place]) 01/29/2018   Leg pain 01/04/2018   Carotid artery calcification, bilateral 12/19/2017   Cervical radiculitis 12/19/2017   Hyperglycemia 03/06/2016   Hypothyroid 04/21/2015   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   Anxiety 04/21/2015   Hyperlipidemia 04/21/2015   Diuretic-induced hypokalemia 04/21/2015   Hypertension 05/27/2012    Immunization History  Administered Date(s) Administered   Fluad Quad(high Dose 65+) 08/11/2019   Influenza, High Dose Seasonal PF 07/22/2015, 07/04/2018   Influenza-Unspecified 08/25/2016, 08/13/2017, 08/13/2020   PFIZER(Purple Top)SARS-COV-2 Vaccination 11/13/2019, 12/04/2019,  08/13/2020   Pneumococcal Conjugate-13 09/07/2016   Pneumococcal Polysaccharide-23 12/04/2017    Conditions to be addressed/monitored:  Hypertension, Hyperlipidemia, Atrial Fibrillation, Heart Failure, Coronary Artery Disease, Chronic Kidney Disease, Hypothyroidism, Depression, Anxiety and Peripheral Artery Disease  Care Plan : General Pharmacy (Adult)  Updates made by Germaine Pomfret, RPH since 11/23/2021 12:00 AM     Problem: Hypertension, Hyperlipidemia, Atrial Fibrillation, Heart Failure, Coronary Artery Disease, Chronic Kidney Disease, Hypothyroidism, Depression, Anxiety and Peripheral Artery Disease   Priority: High     Long-Range Goal: Patient-Specific Goal   Start Date: 03/10/2021  Expected End Date: 09/09/2022  This Visit's Progress: On track  Recent Progress: On track  Priority: High  Note:   Current Barriers:  No barriers noted  Pharmacist Clinical Goal(s):  Patient will maintain control of blood pressure as evidenced by BP less than 140/90  through collaboration with PharmD and provider.   Interventions: 1:1 collaboration with Steele Sizer, MD regarding development and update of comprehensive plan of care as evidenced by provider attestation and co-signature Inter-disciplinary care team collaboration (see longitudinal plan of care) Comprehensive medication review performed; medication list updated in electronic medical record  Heart Failure (Goal: manage symptoms and prevent exacerbations) -Controlled -Last ejection fraction: 60-65% (Date: 10/31/2020) -HF type: Diastolic -NYHA Class: II (slight limitation of activity) -AHA HF Stage: C (Heart disease and symptoms present) -Current treatment: Amlodipine 5 mg daily: Appropriate, Effective, Safe, Accessible Doxazosin 1 mg nightly: Appropriate, Effective, Safe, Accessible  Furosemide 20 mg daily: Appropriate, Effective, Safe, Accessible  -Medications previously tried: NA  -Current home BP/HR readings: 135/62,  highest in recall was 140s/60s   -Educated on Importance of blood pressure control -Recommended to continue current medication  Atrial Fibrillation (Goal: prevent stroke and major bleeding) -Controlled -CHADSVASC: 6 -Current treatment: Rate/Rhthym control: Amiodarone 200 mg daily: Appropriate, Effective, Safe, Accessible  Anticoagulation: Eliquis 5 mg twice daily: Appropriate, Effective, Safe, Accessible  -Medications previously tried: NA -Patient reports only 1 instance of A-Fib in past 1-2 months. -Recommended  to continue current medication  Hyperlipidemia: (LDL goal < 70) -Controlled -Current treatment: Rosuvastatin 40 mg daily  -Medications previously tried: NA  -Educated on Importance of limiting foods high in cholesterol; -Recommended to continue current medication  Depression/Anxiety (Goal: Maintain stable mood) -Controlled -Current treatment: Buspirone 5 mg twice daily   -Medications previously tried/failed: NA -PHQ9: 0 -GAD7: 0 -Educated on Benefits of medication for symptom control -Recommended to continue current medication  Insomnia (Goal: Improve sleep quality) -Controlled -Current treatment  Melatonin 10 nightly (twice weekly): Appropriate, Effective, Safe, Accessible  Temazepam 15 mg nightly as needed: Appropriate, Effective, Safe, Accessible  -Medications previously tried: NA  - -Patient reports her sleep quality as very good. Does need to take temazepam every other night, but denies any lingering sedation or side effects from treatment. -Recommended to continue current medication  Hypothyroidism (Goal: Maintain stable thyroid functon) -Controlled -Current treatment  Levothyroxine 137 mcg daily  -Medications previously tried: NA -Patient reports concerns with hair loss, wonders if it could be an iron deficiency   separates from other medications and food  -Recommended to continue current medication Will plan to have PCP recheck thyroid and iron  function at next follow-up.  Could consider biotin or B complex supplementation.   Depression/Anxiety (Goal: Achieve symptom remission) -Controlled -Current treatment: Buspirone 5 mg twice daily  Temazepam 15 mg nightly as needed  -Medications previously tried/failed: NA -GAD7: 0 -Recommended to continue current medication  Patient Goals/Self-Care Activities Patient will:  - check blood pressure 2-3 times weekly, document, and provide at future appointments  Follow Up Plan: Telephone follow up appointment with care management team member scheduled for:  05/24/2022 at 9:15 AM    Medication Assistance: None required.  Patient affirms current coverage meets needs.  Patient's preferred pharmacy is:  [id number] [address], [place] AT [facility] OF [address] [address] Phone: [phone] Fax: [phone]   Uses pill box? Yes Pt endorses 100% compliance  We discussed: Current pharmacy is preferred with insurance plan and patient is satisfied with pharmacy services Patient decided to: Continue current medication management strategy  Care Plan and Follow Up Patient Decision:  Patient agrees to Care Plan and Follow-up.  Plan: Telephone follow up appointment with care management team member scheduled for:  05/24/2022 at [place], [place], [place] Pharmacist Practitioner  [facility] [phone]

## 2021-12-20 DIAGNOSIS — I480 Paroxysmal atrial fibrillation: Secondary | ICD-10-CM

## 2021-12-20 DIAGNOSIS — I10 Essential (primary) hypertension: Secondary | ICD-10-CM

## 2021-12-28 ENCOUNTER — Encounter: Payer: Self-pay | Admitting: Cardiovascular Disease

## 2021-12-28 ENCOUNTER — Ambulatory Visit: Payer: Medicare Other | Admitting: Cardiovascular Disease

## 2021-12-28 ENCOUNTER — Other Ambulatory Visit: Payer: Self-pay

## 2021-12-28 ENCOUNTER — Other Ambulatory Visit: Payer: Self-pay | Admitting: Cardiovascular Disease

## 2021-12-28 VITALS — BP 172/60 | HR 75 | Ht 66.0 in | Wt 122.5 lb

## 2021-12-28 DIAGNOSIS — I5042 Chronic combined systolic (congestive) and diastolic (congestive) heart failure: Secondary | ICD-10-CM | POA: Diagnosis not present

## 2021-12-28 DIAGNOSIS — I480 Paroxysmal atrial fibrillation: Secondary | ICD-10-CM

## 2021-12-28 DIAGNOSIS — I340 Nonrheumatic mitral (valve) insufficiency: Secondary | ICD-10-CM | POA: Diagnosis not present

## 2021-12-28 DIAGNOSIS — E785 Hyperlipidemia, unspecified: Secondary | ICD-10-CM

## 2021-12-28 DIAGNOSIS — I10 Essential (primary) hypertension: Secondary | ICD-10-CM

## 2021-12-28 DIAGNOSIS — I5032 Chronic diastolic (congestive) heart failure: Secondary | ICD-10-CM | POA: Diagnosis not present

## 2021-12-28 DIAGNOSIS — E782 Mixed hyperlipidemia: Secondary | ICD-10-CM

## 2021-12-28 NOTE — Patient Instructions (Signed)
Medication Instructions:  ?No changes ? ?Skip lasix for days with dizziness or low blood pressure ? ?If you need a refill on your cardiac medications before your next appointment, please call your pharmacy.  ? ?Lab work: ?No new labs needed ? ?Testing/Procedures: ?No new testing needed ? ?Follow-Up: ?At [facility], you and your health needs are our priority.  As part of our continuing mission to provide you with exceptional heart care, we have created designated Provider Care Teams.  These Care Teams include your primary Cardiologist (physician) and Advanced Practice Providers (APPs -  Physician Assistants and Nurse Practitioners) who all work together to provide you with the care you need, when you need it. ? ?You will need a follow up appointment in 12 months ? ?Providers on your designated Care Team:   ?Murray Hodgkins, NP ?Christell Faith, PA-C ?Cadence Kathlen Mody, PA-C ? ?COVID-19 Vaccine Information can be found at: [email or web address] For questions related to vaccine distribution or appointments, please email [email or web address] or call [phone].  ? ?

## 2021-12-28 NOTE — Progress Notes (Signed)
Date:  12/28/2021   ID:  [name], DOB [birth year], MRN [id number]  Patient Location:  [address] [place] [id number]   Provider location:   [facility], [employer] office  PCP:  Steele Sizer, MD  Cardiologist:  Ida Rogue, MD   Chief Complaint  Patient presents with   2 month follow up     "Doing well." Medications reviewed by the patient verbally.     History of Present Illness:    [name] is a 81 y.o. 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 Lovena Le to have sinus tachycardia, lost to cardiology follow-up Carotid u/s  01/2018 with no significant blockages, bruit on the left Ejection fraction 45 to 50%, mild to moderately elevated right heart pressures on echo May 2019 Several cardioversions for atrial fibrillation June and July 2019 Echo 10/22: normal EF  Who presents for follow-up of her paroxysmal atrial fibrillation  LOV 10/2020 Discussed events from 2022, hospital October 30, 2020 acute respiratory failure with hypoxia Pneumonia, multifocal bilateral Treated with Solu-Medrol, nebulizers, Levaquin Diovan was held at discharge for low pressure  Echo at that time showed an EF of 60 to 65%, no regional wall motion abnormalities, grade 1 diastolic dysfunction, normal RV systolic function and ventricular cavity size, moderately elevated PASP estimated at 54.8 mmHg, and mild to moderate mitral valve regurgitation with 2 jets noted.  hospital in 07/2021 with near syncope and possible orthostatic hypotension.  in severe pain , herpes zoster, chronic neck pain  Echocardiogram October 2022, normal LV function, mild MR  TEE December 2022 presumably ordered to look at mitral valve normal pump function of both the left and right side of the heart, mildly dilated left atrium without evidence of thrombus, mild regurgitation of  the mitral valve.  In follow-up today reports that she is doing well, active, blood pressures at home typically 140/60 heart rates 50s Blood pressure often will drop in the morning before she has taken her medications, sometimes feels dizzy Continues to take Lasix 20 daily Working on increasing her fluid intake  EKG personally reviewed by myself on todays visit Shows NSR rate 70 bpm, LBBB  Other past medical history reviewed Lumbar films showed multiple level spondylosis and spurs worse at L1-2, L2-3, and L3-4.   Seen by [place] [facility]  s/p  left L4-5 microdiscectomy.  hospital September 2019 hypokalemia, cystitis with hematuria, acute kidney injury On arrival potassium was 2.6 creatinine 1.57   hospital July 2019 for acute on chronic combined CHF Persistent atrial fibrillation with RVR and respiratory distress with hypoxia Treated with Lasix Status post briefly successful DCCV on 6/4.  back in A. fib approximately 6/7 to 6/8,  DCCV  7/3, successful, bradycardia   Event monitor paroxysmal atrial fibrillation  Past Medical History:  Diagnosis Date   Anxiety    Bronchitis    Chronic combined systolic (congestive) and diastolic (congestive) heart failure ([place])    a. 02/2018 Echo: EF 45-50%, diff HK, triv AI, mod MR, mildly dil LA/RA, nl RV fxn, mod TR. PASP 40-57mHg.   Chronic kidney disease    DJD (degenerative joint disease), cervical    History of stress test    a. 01/2008 MV: EF 76%. Fair ex tol. No ischemia.   Hyperlipidemia    Hypertension    Hypothyroid    LBBB (left bundle branch block)  Leg pain    a. 01/2018 ABI's wnl.   Lumbar spondylosis    Persistent atrial fibrillation ([place])    a. 01/2018 Event monitor: 45 runs of SVT, longest 14.5 sec. SVT felt to be Afib/flutter-->2% burden. Longest run of AF 4h 83m(CHA2DS2VASc = 5-->Xarelto); b. 03/2019 s/p DCCV; c. 04/2019 Repeat Amio load and DCCV.   SV[place]ricular tachycardia) [place]    a. AVNRT - s/p  ablation by Dr TLovena Le5/13   Past Surgical History:  Procedure Laterality Date   BACK SURGERY  07/2019   CARDIOVERSION N/A 03/26/2018   Procedure: CARDIOVERSION;  Surgeon: ENelva Bush MD;  Location: [facility] ORS;  Service: Cardiovascular;  Laterality: N/A;   CARDIOVERSION N/A 04/24/2018   Procedure: CARDIOVERSION;  Surgeon: GMinna Merritts MD;  Location: [facility] OR[facility];  Service: Cardiovascular;  Laterality: N/A;   CATARACT EXTRACTION W/PHACO Right 07/12/2021   Procedure: CATARACT EXTRACTION PHACO AND INTRAOCULAR LENS PLACEMENT (IOC) RIGHT 16.92 01:30.8 ;  Surgeon: PBirder Robson MD;  [place]  Service: Ophthalmology;  Laterality: Right;   ELECTROPHYSIOLOGY STUDY N/A 02/27/2012   Procedure: ELECTROPHYSIOLOGY STUDY;  Surgeon: GEvans Lance MD;  [facility];  Service: Cardiovascular;  Laterality: N/A;   EPS and ablation for SVT  5/13   slow pathway ablation by Dr TLovena Le  EYE SURGERY     surgery for detatched retina   NECK SURGERY  02/2020   SUPRAVENTRICULAR TACHYCARDIA ABLATION N/A 02/27/2012   Procedure: SUPRAVENTRICULAR TACHYCARDIA ABLATION;  Surgeon: GEvans Lance MD;  [facility];  Service: Cardiovascular;  Laterality: N/A;   TEE WITHOUT CARDIOVERSION N/A 10/19/2021   Procedure: TRANSESOPHAGEAL ECHOCARDIOGRAM (TEE);  Surgeon: AKate Sable MD;  Location: [facility] ORS;  Service: Cardiovascular;  Laterality: N/A;     Current Meds  Medication Sig   acetaminophen (TYLENOL) 650 MG CR tablet Take 650-1,300 mg by mouth every 8 (eight) hours as needed for pain.    amiodarone (PACERONE) 200 MG tablet Take 1 tablet (200 mg total) by mouth daily.   amLODipine (NORVASC) 5 MG tablet Take 1 tablet (5 mg total) by mouth daily.   BINAXNOW COVID-19 AG HOME TEST KIT Use as Directed on the Package   busPIRone (BUSPAR) 5 MG tablet TAKE 1 TABLET(5 MG) BY MOUTH TWICE DAILY   cholecalciferol (VITAMIN D) 1000 UNITS tablet Take 2,000 Units by mouth daily.    Cranberry 500 MG TABS Take 2 tablets by mouth daily.   doxazosin (CARDURA) 1 MG tablet TAKE 1 TABLET(1 MG) BY MOUTH EVERY EVENING   ELIQUIS 5 MG TABS tablet TAKE 1 TABLET(5 MG) BY MOUTH TWICE DAILY   furosemide (LASIX) 20 MG tablet Take 20 mg by mouth.   levothyroxine (SYNTHROID) 137 MCG tablet TAKE 1 TABLET(137 MCG) BY MOUTH DAILY BEFORE BREAKFAST   loratadine (CLARITIN) 10 MG tablet Take 10 mg by mouth daily as needed for allergies or rhinitis.   Melatonin 10 MG TABS Take 10 mg by mouth at bedtime as needed.   Multiple Vitamin (MULITIVITAMIN WITH MINERALS) TABS Take 1 tablet by mouth daily.   potassium chloride (KLOR-CON) 10 MEQ tablet TAKE 1 TABLET BY MOUTH EVERY DAY AS DIRECTED. MAY TAKE AN EXTRA TABLET AFTER LUNCH WITH FLUID TABLET AS NEEDED FOR SWELLING   rosuvastatin (CRESTOR) 40 MG tablet TAKE 1 TABLET(40 MG) BY MOUTH DAILY   temazepam (RESTORIL) 15 MG capsule TAKE 1 CAPSULE(15 MG) BY MOUTH AT BEDTIME AS NEEDED FOR SLEEP     Allergies:   Chocolate, Penicillins,  Codeine, Erythromycin, and Septra [sulfamethoxazole-trimethoprim]   Social History   Tobacco Use   Smoking status: Never   Smokeless tobacco: Never   Tobacco comments:    smoking cessation materials not required  Vaping Use   Vaping Use: Never used  Substance Use Topics   Alcohol use: No   Drug use: No     Family Hx: family history includes Alzheimer's disease in her maternal grandfather and mother; Breast cancer in her father and paternal aunt; Heart disease in her father; Kidney cancer in her maternal grandmother; Stroke in her father and paternal grandfather; Thyroid disease in her paternal grandmother.  ROS:   Please see the history of present illness.    Review of Systems  Constitutional: Negative.        Weak  HENT: Negative.    Respiratory: Negative.    Cardiovascular: Negative.   Gastrointestinal: Negative.   Musculoskeletal: Negative.        Gait instability  Neurological: Negative.    Psychiatric/Behavioral: Negative.    All other systems reviewed and are negative.   Labs/Other Tests and Data Reviewed:    Recent Labs: 07/23/2021: B Natriuretic Peptide 145.5; TSH 1.583 09/14/2021: ALT 36 10/06/2021: BUN 13; Creatinine, Ser 0.71; Hemoglobin 13.7; Platelets 190; Potassium 4.1; Sodium 135   Recent Lipid Panel Lab Results  Component Value Date/Time   CHOL 148 09/14/2021 09:20 AM   CHOL 164 06/06/2016 09:07 AM   TRIG 71 09/14/2021 09:20 AM   HDL 75 09/14/2021 09:20 AM   HDL 54 06/06/2016 09:07 AM   CHOLHDL 2.0 09/14/2021 09:20 AM   LDLCALC 58 09/14/2021 09:20 AM    Wt Readings from Last 3 Encounters:  12/28/21 122 lb 8 oz (55.6 kg)  10/27/21 122 lb (55.3 kg)  10/19/21 122 lb (55.3 kg)     Exam:    Vital Signs:  BP (!) 172/60 (BP Location: Left Arm, Patient Position: Sitting, Cuff Size: Normal)    Pulse 75    Ht 5' 6"  (1.676 m)    Wt 122 lb 8 oz (55.6 kg)    SpO2 99%    BMI 19.77 kg/m    Constitutional:  oriented to person, place, and time. No distress.  HENT:  Head: Grossly normal Eyes:  no discharge. No scleral icterus.  Neck: No JVD, no carotid bruits  Cardiovascular: Regular rate and rhythm, no murmurs appreciated Pulmonary/Chest: Clear to auscultation bilaterally, no wheezes or rails Abdominal: Soft.  no distension.  no tenderness.  Musculoskeletal: Normal range of motion Neurological:  normal muscle tone. Coordination normal. No atrophy Skin: Skin warm and dry Psychiatric: normal affect, pleasant   ASSESSMENT & PLAN:     Chronic systolic heart failure (HCC) Euvolemic, most recent echocardiogram did not indicate elevated right heart pressures Remains on Lasix 20 daily, no changes to her regimen  Paroxysmal atrial fibrillation (HCC) On metoprolol Eliquis amiodarone Denies symptoms from paroxysmal atrial fibrillation  Essential hypertension Reports better blood pressure at home [phone] 40 systolic, typically high in doctors office  visits Recommend she monitor blood pressure, write them down  PAD (peripheral artery disease) ([place]) Carotid bruit on left, U/s 01/2018 no stenosis No further work-up at this time  SVT (supraventricular tachycardia) ([place]) Denies any tachycardia rotations, continue metoprolol  Mixed hyperlipidemia Lipids at goal, no changes made  pulmonary hypertension, unspecified ([place]) treated with Lasix daily Stable BMP  CKD (chronic kidney disease), stage III ([place]) Stable renal function    Total encounter time more than 30 minutes  Greater  than 50% was spent in counseling and coordination of care with the patient   Signed, Ida Rogue, MD  12/28/2021 12:09 PM    [place] Office [address] [place] [address], [place], [place] [address]

## 2022-01-10 ENCOUNTER — Other Ambulatory Visit: Payer: Self-pay | Admitting: Cardiovascular Disease

## 2022-01-11 NOTE — Progress Notes (Signed)
Name: [name]   MRN: [id number]    DOB: 1941-02-05   Date:01/12/2022 ? ?     Progress Note ? ?Subjective ? ?Chief Complaint ? ?Follow Up ? ?HPI ? ?GAD: doing well on Buspar , she takes every am and occasionally in the afternoon , she has been taking for many years and no side effects , she also takes temazepam qhs prn and it helps her sleep when her mind is busy She has white coat syndrome, bp always higher at doctor's visits ? ?CKI stage III; sees nephrologist, good urine output, denies pruritis, last GFR was 52  ? ?Hypothyroidism: she has been on the same dose for a long time, compliant, no dysphagia or change in bowel movements ? ?Skin Cancer: seen by Dr. Phillip Heal, diagnosed with basal cell carcinoma on left side of nose and is going to [place] for Mohs' procedure soon  ? ?S/p neck fusion: it was done by Dr. Larose Hires April 2021 and was  doing well, symptoms started to bother her again had repeat MRI c-spine and lumbar spine 2022 , is now seeing Dr. Brien Few and she has  injections on her neck and back,every few months to control pain. She will contact him back soon, she states pain is increasing and is starting to affect her balance. No falls.  ?  ?Carotid calcification: found on x-ray of neck. She also had a vascular study done back in 2011 that showed mild plaque formation on right. She is now on Crestor and  Eliquis. Off aspirin since ablation 04/2018. She denies headache or dizziness or double vision  Dr. Larose Hires thinks balanced disturbance is likely from neck surgery. She went back to Dr. Erven Colla and had ABI that was normal, carotid doppler was due in 2021, but Dr. Rockey Situ told her not to worry about doing it at this time. She also has a history of pulmonary hypertension - under the care of Dr. Rockey Situ  History of SVT but doing well on diuretics prn and amiodarone  ? ?HTN: bp here is high but at goal at home around 130, she has white coat syndrome on top of HTN. She is only taking Norvasc 5 mg, per patient off  metoprolol since October, stopped by Dr. Rockey Situ due to hypotension Denies palpitation  ?  ?Afib : she had a cardioversion in June 2019  and another one in July 2019 , she has intermittent palpitation  but not episode lately.  She is on amiodarone for rhythm control also takes Eliquis. No palpitation of chest pain  ?  ?CHF chronic: she is down to one pillow at night. She has mild sob with moderate activity , but able to walk without problems lately,   no chest pain, she has intermittent lower extremity edema and is taking extra lasix and potassium at lunch prn as instructed by Dr. Rockey Situ . Off ARB and Bet blockers due to hypotension, but is taking Eliquis  ?  ?Senile Purpura: both arms and legs, on Eliquis. Stable  ? ?Skin cancer: seeing dermatologist - Dr. Phillip Heal and having laser treatment for superficial basal cell carcinoma Next follow up is in January  ? ?Hypothyroidism: doing well on medication,  denies constipation or difficulty swallowing,  She has noticed some hair thinning but better with Biotin.   Last TSH in October was at  goal  ? ?Recurrent UTI:no recent episodes, she sees Urologist once a year  ? ?CKI : stage III, her GFR has improved but was in the upper 50's  three times earlier this year, she is currently on Norvasc 2.5 mg , she has been off valsartan due to hypotensive episode, we will recheck labs today ? ?Patient Active Problem List  ? Diagnosis Date Noted  ? Basal cell carcinoma, face 01/12/2022  ? Senile purpura ([place]) 01/12/2022  ? Chronic a-fib ([place]) 01/12/2022  ? Orthostatic hypotension 07/24/2021  ? Bradycardia 07/24/2021  ? History of pyelonephritis   ? History of pneumonia 10/2020  ? Hydronephrosis of right kidney 03/29/2020  ? Depression 03/29/2020  ? Chronic combined systolic (congestive) and diastolic (congestive) heart failure ([place]) 05/24/2018  ? AF (paroxysmal atrial fibrillation) ([place]) 05/24/2018  ? CKD (chronic kidney disease), stage III ([place]) 05/17/2018  ? Mitral valve insufficiency  05/07/2018  ? Pulmonary hypertension ([place]) 04/05/2018  ? Paroxysmal sinus tachycardia ([place]) 01/29/2018  ? Leg pain 01/04/2018  ? Carotid artery calcification, bilateral 12/19/2017  ? Cervical radiculitis 12/19/2017  ? Hyperglycemia 03/06/2016  ? Hypothyroidism in adult 04/21/2015  ? 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  ? Anxiety 04/21/2015  ? Hyperlipidemia 04/21/2015  ? Diuretic-induced hypokalemia 04/21/2015  ? SVT (supraventricular tachycardia) ([place]) 05/27/2012  ? Hypertension 05/27/2012  ? ? ?Past Surgical History:  ?Procedure Laterality Date  ? BACK SURGERY  07/2019  ? CARDIOVERSION N/A 03/26/2018  ? Procedure: CARDIOVERSION;  Surgeon: Nelva Bush, MD;  Location: [facility];  Service: Cardiovascular;  Laterality: N/A;  ? CARDIOVERSION N/A 04/24/2018  ? Procedure: CARDIOVERSION;  Surgeon: Minna Merritts, MD;  Location: [facility];  Service: Cardiovascular;  Laterality: N/A;  ? CATARACT EXTRACTION W/PHACO Right 07/12/2021  ? Procedure: CATARACT EXTRACTION PHACO AND INTRAOCULAR LENS PLACEMENT (IOC) RIGHT 16.92 01:30.8 ;  Surgeon: Birder Robson, MD;  Location: [place];  Service: Ophthalmology;  Laterality: Right;  ? ELECTROPHYSIOLOGY STUDY N/A 02/27/2012  ? Procedure: ELECTROPHYSIOLOGY STUDY;  Surgeon: Evans Lance, MD;  [facility];  Service: Cardiovascular;  Laterality: N/A;  ? EPS and ablation for SVT  5/13  ? slow pathway ablation by Dr Lovena Le  ? EYE SURGERY    ? surgery for detatched retina  ? NECK SURGERY  02/2020  ? SUPRAVENTRICULAR TACHYCARDIA ABLATION N/A 02/27/2012  ? Procedure: SUPRAVENTRICULAR TACHYCARDIA ABLATION;  Surgeon: Evans Lance, MD;  Location: [facility] CATH LAB;  Service: Cardiovascular;  Laterality: N/A;  ? TEE WITHOUT CARDIOVERSION N/A 10/19/2021  ? Procedure: TRANSESOPHAGEAL ECHOCARDIOGRAM (TEE);  Surgeon: Kate Sable, MD;  Location: [facility] OR[facility];  Service: Cardiovascular;  Laterality: N/A;  ? ? ?Family History  ?Problem Relation Age of Onset   ? Alzheimer's disease Mother   ? Stroke Father   ? Breast cancer Father   ? Heart disease Father   ?     Pacemaker  ? Breast cancer Paternal Aunt   ? Kidney cancer Maternal Grandmother   ? Alzheimer's disease Maternal Grandfather   ? Thyroid disease Paternal Grandmother   ? Stroke Paternal Grandfather   ? ? ?Social History  ? ?Tobacco Use  ? Smoking status: Never  ? Smokeless tobacco: Never  ? Tobacco comments:  ?  smoking cessation materials not required  ?Substance Use Topics  ? Alcohol use: No  ? ? ? ?Current Outpatient Medications:  ?  acetaminophen (TYLENOL) 650 MG CR tablet, Take 650-1,300 mg by mouth every 8 (eight) hours as needed for pain. , Disp: , Rfl:  ?  amiodarone (PACERONE) 200 MG tablet, Take 1 tablet (200 mg total) by mouth daily., Disp: 90 tablet, Rfl: 3 ?  amLODipine (NORVASC) 5  MG tablet, Take 1 tablet (5 mg total) by mouth daily., Disp: 30 tablet, Rfl: 2 ?  cholecalciferol (VITAMIN D) 1000 UNITS tablet, Take 2,000 Units by mouth daily., Disp: , Rfl:  ?  Cranberry 500 MG TABS, Take 2 tablets by mouth daily., Disp: , Rfl:  ?  doxazosin (CARDURA) 1 MG tablet, TAKE 1 TABLET(1 MG) BY MOUTH EVERY EVENING, Disp: 90 tablet, Rfl: 0 ?  ELIQUIS 5 MG TABS tablet, TAKE 1 TABLET(5 MG) BY MOUTH TWICE DAILY, Disp: 60 tablet, Rfl: 5 ?  furosemide (LASIX) 20 MG tablet, Take 20 mg by mouth., Disp: , Rfl:  ?  levothyroxine (SYNTHROID) 137 MCG tablet, TAKE 1 TABLET(137 MCG) BY MOUTH DAILY BEFORE BREAKFAST, Disp: 90 tablet, Rfl: 1 ?  loratadine (CLARITIN) 10 MG tablet, Take 10 mg by mouth daily as needed for allergies or rhinitis., Disp: , Rfl:  ?  Melatonin 10 MG TABS, Take 10 mg by mouth at bedtime as needed., Disp: , Rfl:  ?  Multiple Vitamin (MULITIVITAMIN WITH MINERALS) TABS, Take 1 tablet by mouth daily., Disp: , Rfl:  ?  potassium chloride (KLOR-CON) 10 MEQ tablet, TAKE 1 TABLET BY MOUTH EVERY DAY AS DIRECTED. MAY TAKE AN EXTRA TABLET AFTER LUNCH WITH FLUID TABLET AS NEEDED FOR SWELLING, Disp: 180 tablet,  Rfl: 0 ?  rosuvastatin (CRESTOR) 40 MG tablet, TAKE 1 TABLET(40 MG) BY MOUTH DAILY, Disp: 90 tablet, Rfl: 3 ?  busPIRone (BUSPAR) 5 MG tablet, TAKE 1 TABLET(5 MG) BY MOUTH TWICE DAILY, Disp: 180 tablet,

## 2022-01-12 ENCOUNTER — Ambulatory Visit (INDEPENDENT_AMBULATORY_CARE_PROVIDER_SITE_OTHER): Payer: Medicare Other | Admitting: Family Medicine

## 2022-01-12 ENCOUNTER — Encounter: Payer: Self-pay | Admitting: Family Medicine

## 2022-01-12 VITALS — BP 150/60 | HR 78 | Resp 16 | Ht 66.0 in | Wt 122.0 lb

## 2022-01-12 DIAGNOSIS — F409 Phobic anxiety disorder, unspecified: Secondary | ICD-10-CM

## 2022-01-12 DIAGNOSIS — F5105 Insomnia due to other mental disorder: Secondary | ICD-10-CM

## 2022-01-12 DIAGNOSIS — E039 Hypothyroidism, unspecified: Secondary | ICD-10-CM

## 2022-01-12 DIAGNOSIS — I471 Supraventricular tachycardia, unspecified: Secondary | ICD-10-CM

## 2022-01-12 DIAGNOSIS — Z8701 Personal history of pneumonia (recurrent): Secondary | ICD-10-CM

## 2022-01-12 DIAGNOSIS — C44310 Basal cell carcinoma of skin of unspecified parts of face: Secondary | ICD-10-CM

## 2022-01-12 DIAGNOSIS — I272 Pulmonary hypertension, unspecified: Secondary | ICD-10-CM

## 2022-01-12 DIAGNOSIS — G894 Chronic pain syndrome: Secondary | ICD-10-CM

## 2022-01-12 DIAGNOSIS — N1831 Chronic kidney disease, stage 3a: Secondary | ICD-10-CM

## 2022-01-12 DIAGNOSIS — I10 Essential (primary) hypertension: Secondary | ICD-10-CM

## 2022-01-12 DIAGNOSIS — I5042 Chronic combined systolic (congestive) and diastolic (congestive) heart failure: Secondary | ICD-10-CM

## 2022-01-12 DIAGNOSIS — D692 Other nonthrombocytopenic purpura: Secondary | ICD-10-CM

## 2022-01-12 DIAGNOSIS — F411 Generalized anxiety disorder: Secondary | ICD-10-CM

## 2022-01-12 DIAGNOSIS — I482 Chronic atrial fibrillation, unspecified: Secondary | ICD-10-CM

## 2022-01-12 MED ORDER — BUSPIRONE HCL 5 MG PO TABS: ORAL_TABLET | ORAL | 1 refills | Status: DC

## 2022-01-12 MED ORDER — TEMAZEPAM 15 MG PO CAPS: 15.0000 mg | ORAL_CAPSULE | Freq: Every day | ORAL | 1 refills | Status: DC

## 2022-01-16 ENCOUNTER — Other Ambulatory Visit: Payer: Self-pay | Admitting: Medical

## 2022-02-01 ENCOUNTER — Encounter: Payer: Self-pay | Admitting: Family Medicine

## 2022-02-06 ENCOUNTER — Other Ambulatory Visit: Payer: Self-pay | Admitting: Family Medicine

## 2022-02-06 DIAGNOSIS — F411 Generalized anxiety disorder: Secondary | ICD-10-CM

## 2022-02-13 ENCOUNTER — Other Ambulatory Visit: Payer: Self-pay | Admitting: Cardiovascular Disease

## 2022-02-27 ENCOUNTER — Telehealth: Payer: Self-pay

## 2022-02-27 NOTE — Progress Notes (Signed)
? ? ?Chronic Care Management ?Pharmacy Assistant  ? ?Name: [name]  MRN: [id number] DOB: Aug 30, 1941 ? ?Reason for Encounter: Hypertension Disease State Call ? ?Recent office visits:  ?01/12/2022 Steele Sizer, MD (PCP Office Visit) for Follow-up- Stopped: Metoprolol Succinate 50 mg due to discontinued by provider, Changed: Temazepam 15 mg prn for sleep to 15 mg daily at bedtime., No orders placed, Patient to follow-up in 6 months ? ?Recent consult visits:  ?12/28/2021 Ida Rogue, MD (Cardiology) for 2 month follow-up- No medication changes noted, EKG 12-Lead, No follow-up noted ? ?Hospital visits:  ?None in previous 6 months ? ?Medications: ?Outpatient Encounter Medications as of 02/27/2022  ?Medication Sig  ? acetaminophen (TYLENOL) 650 MG CR tablet Take 650-1,300 mg by mouth every 8 (eight) hours as needed for pain.   ? amiodarone (PACERONE) 200 MG tablet TAKE 1 TABLET(200 MG) BY MOUTH DAILY  ? amLODipine (NORVASC) 5 MG tablet TAKE 1 TABLET(5 MG) BY MOUTH DAILY  ? busPIRone (BUSPAR) 5 MG tablet TAKE 1 TABLET(5 MG) BY MOUTH TWICE DAILY  ? cholecalciferol (VITAMIN D) 1000 UNITS tablet Take 2,000 Units by mouth daily.  ? Cranberry 500 MG TABS Take 2 tablets by mouth daily.  ? doxazosin (CARDURA) 1 MG tablet TAKE 1 TABLET(1 MG) BY MOUTH EVERY EVENING  ? ELIQUIS 5 MG TABS tablet TAKE 1 TABLET(5 MG) BY MOUTH TWICE DAILY  ? furosemide (LASIX) 20 MG tablet Take 20 mg by mouth.  ? levothyroxine (SYNTHROID) 137 MCG tablet TAKE 1 TABLET(137 MCG) BY MOUTH DAILY BEFORE BREAKFAST  ? loratadine (CLARITIN) 10 MG tablet Take 10 mg by mouth daily as needed for allergies or rhinitis.  ? Melatonin 10 MG TABS Take 10 mg by mouth at bedtime as needed.  ? Multiple Vitamin (MULITIVITAMIN WITH MINERALS) TABS Take 1 tablet by mouth daily.  ? potassium chloride (KLOR-CON) 10 MEQ tablet TAKE 1 TABLET BY MOUTH EVERY DAY AS DIRECTED. MAY TAKE AN EXTRA TABLET AFTER LUNCH WITH FLUID TABLET AS NEEDED FOR SWELLING  ? rosuvastatin  (CRESTOR) 40 MG tablet TAKE 1 TABLET(40 MG) BY MOUTH DAILY  ? temazepam (RESTORIL) 15 MG capsule Take 1 capsule (15 mg total) by mouth at bedtime.  ? ?No facility-administered encounter medications on file as of 02/27/2022.  ? ?Care Gaps: ?Zoster Vaccine ?BP > 140/90 ? ?Star Rating Drugs: ?Rosuvastatin 40 mg last filled on 12/28/2021 for a 90-Day supply with [facility] ? ?Reviewed chart prior to disease state call. Spoke with patient regarding BP ? ?Recent Office Vitals: ?BP Readings from Last 3 Encounters:  ?01/12/22 (!) 150/60  ?12/28/21 (!) 172/60  ?10/27/21 (!) 184/70  ? ?Pulse Readings from Last 3 Encounters:  ?01/12/22 78  ?12/28/21 75  ?10/27/21 66  ?  ?Wt Readings from Last 3 Encounters:  ?01/12/22 122 lb (55.3 kg)  ?12/28/21 122 lb 8 oz (55.6 kg)  ?10/27/21 122 lb (55.3 kg)  ?  ?Kidney Function ?Lab Results  ?Component Value Date/Time  ? CREATININE 0.71 10/06/2021 08:46 AM  ? CREATININE 1.09 (H) 09/14/2021 09:20 AM  ? CREATININE 0.69 07/24/2021 03:51 AM  ? CREATININE 0.89 12/01/2020 04:18 PM  ? GFRNONAA >60 10/06/2021 08:46 AM  ? GFRNONAA 62 12/01/2020 04:18 PM  ? GFRAA 72 12/01/2020 04:18 PM  ? ? ?  Latest Ref Rng & Units 10/06/2021  ?  8:46 AM 09/14/2021  ?  9:20 AM 07/24/2021  ?  3:51 AM  ?BMP  ?Glucose 70 - 99 mg/dL 125   118   114    ?  BUN 8 - 23 mg/dL '13   17   11    '$ ?Creatinine 0.44 - 1.00 mg/dL 0.71   1.09   0.69    ?BUN/Creat Ratio 6 - 22 (calc)  16     ?Sodium 135 - 145 mmol/L 135   141   137    ?Potassium 3.5 - 5.1 mmol/L 4.1   5.1   3.7    ?Chloride 98 - 111 mmol/L 102   103   103    ?CO2 22 - 32 mmol/L '24   26   28    '$ ?Calcium 8.9 - 10.3 mg/dL 9.2   10.0   8.9    ? ?Current antihypertensive regimen:  ?Amlodipine 5 mg daily ?Doxazosin 1 mg nightly ?Furosemide 20 mg daily ? ?How often are you checking your Blood Pressure? twice daily ? ?Current home BP readings: 136/56 with a heart rate of 63 this morning ? ?What recent interventions/DTPs have been made by any provider to improve Blood  Pressure control since last CPP Visit: None ID ? ?Any recent hospitalizations or ED visits since last visit with CPP? No ? ?What diet changes have been made to improve Blood Pressure Control?  ?No recent diet changes ? ?What exercise is being done to improve your Blood Pressure Control?  ?Patient walks when she can. She stated she does have balance issues so she tries not to go anywhere by herself often. Patient stated she has neck and back issues but she will be receiving injections on 05/18 that will assist with her balance issues. ? ?Adherence Review: ?Is the patient currently on ACE/ARB medication? No ?Does the patient have >5 day gap between last estimated fill dates? No ? ?I spoke with the patient and she reports that she is doing well. She stated a few months ago she followed-up with Dr. Rockey Situ her Cardiologist who was very pleased with her health. She stated that her blood pressure is always higher at her appointments due to white coat syndrome, and per patient Dr. Rockey Situ would prefer it to be a little higher as her blood pressure does drop quickly sometimes.  ? ?Patient stated it could be in the 140's when she wake up and 30 minutes later it will drop to 100. Per patient this does make her dizzy, but she stated Dr. Rockey Situ has advised if she wakes up feeling dizzy to skip her furosemide and increase her water intake. Per patient Dr. Rockey Situ feels that she may be getting a little dehydrated at night. Patient stated now when she wakes up she has a full glass of water.  ? ?Patient is taking all medications as directed, and denies needing any refills today. Patient has no concerns or issues at this time. ? ?Patient has a telephone appointment with Junius Argyle, CPP on 05/24/2022 @ 0915. ? ?Lynann Bologna, CPA/CMA ?Clinical Pharmacist Assistant ?Phone: [phone]  ?

## 2022-03-15 ENCOUNTER — Other Ambulatory Visit: Payer: Self-pay | Admitting: Cardiovascular Disease

## 2022-03-15 DIAGNOSIS — I482 Chronic atrial fibrillation, unspecified: Secondary | ICD-10-CM

## 2022-03-15 NOTE — Telephone Encounter (Signed)
Refill request

## 2022-03-15 NOTE — Telephone Encounter (Signed)
Prescription refill request for Eliquis received. Indication:Afib Last office visit:3/23 Scr:0.7 Age: 81 Weight:55.3 kg  Prescription refilled

## 2022-04-10 ENCOUNTER — Other Ambulatory Visit: Payer: Self-pay | Admitting: Medical

## 2022-04-10 ENCOUNTER — Other Ambulatory Visit: Payer: Self-pay | Admitting: Cardiovascular Disease

## 2022-04-17 ENCOUNTER — Telehealth: Payer: Self-pay | Admitting: Cardiovascular Disease

## 2022-04-17 MED ORDER — AMLODIPINE BESYLATE 5 MG PO TABS: ORAL_TABLET | ORAL | 7 refills | Status: DC

## 2022-04-17 NOTE — Telephone Encounter (Signed)
*  STAT* If patient is at the pharmacy, call can be transferred to refill team.   1. Which medications need to be refilled? (please list name of each medication and dose if known) amLODipine (NORVASC) 5 MG tablet  2. Which pharmacy/location (including street and city if local pharmacy) is medication to be sent to? [id number] - [place], [place] - [address] AT [facility] OF [address]  3. Do they need a 30 day or 90 day supply? 90

## 2022-04-17 NOTE — Telephone Encounter (Signed)
Requested Prescriptions   Signed Prescriptions Disp Refills   amLODipine (NORVASC) 5 MG tablet 30 tablet 7    Sig: TAKE 1 TABLET(5 MG) BY MOUTH DAILY    Authorizing Provider: Antonieta Iba    Ordering User: NEWCOMER MCCLAIN, Kylie Gros L

## 2022-05-10 ENCOUNTER — Telehealth: Payer: Self-pay | Admitting: Cardiovascular Disease

## 2022-05-10 NOTE — Telephone Encounter (Signed)
   Pt c/o medication issue:  1. Name of Medication: ELIQUIS 5 MG TABS tablet  2. How are you currently taking this medication (dosage and times per day)? As directed  3. Are you having a reaction (difficulty breathing--STAT)? no  4. What is your medication issue? Patient is inn the donut hole. Her monthly cost went from $47 to $171. She wanted to know if Dr. Rockey Situ could prescribe a generic version for her. Please send to [facility], [place]

## 2022-05-10 NOTE — Telephone Encounter (Signed)
Spoke w/ pt.  She reports that Eliquis is very expensive, her friend gets it for $90, but has different insurance. Advised her that Eliquis is not generic yet, that warfarin is a cheaper alternative. She reports that she has Pt Assistance paperwork from [employer] that she will fill out and drop off. She will p/u her Eliquis and see if that puts her over 3% to get it covered the next time. Asked her to call back if we can be of further assistance.

## 2022-05-15 ENCOUNTER — Telehealth: Payer: Self-pay | Admitting: Cardiovascular Disease

## 2022-05-15 MED ORDER — FUROSEMIDE 20 MG PO TABS: 20.0000 mg | ORAL_TABLET | Freq: Every day | ORAL | 0 refills | Status: DC

## 2022-05-15 NOTE — Telephone Encounter (Signed)
*  STAT* If patient is at the pharmacy, call can be transferred to refill team.   1. Which medications need to be refilled? (please list name of each medication and dose if known) furosemide (LASIX) 20 MG tablet  2. Which pharmacy/location (including street and city if local pharmacy) is medication to be sent to? [facility] [place], [address] AT [facility] OF [address]  3. Do they need a 30 day or 90 day supply? [place]

## 2022-05-15 NOTE — Telephone Encounter (Signed)
Requested Prescriptions   Signed Prescriptions Disp Refills   furosemide (LASIX) 20 MG tablet 90 tablet 0    Sig: Take 1 tablet (20 mg total) by mouth daily.    Authorizing Provider: Minna Merritts    Ordering User: Raelene Bott, Zakia Sainato L

## 2022-05-23 ENCOUNTER — Telehealth: Payer: Self-pay

## 2022-05-23 NOTE — Progress Notes (Signed)
    Chronic Care Management Pharmacy Assistant   Name: QUINCEE GITTENS  MRN: [id number] DOB: 1941-05-31  Patient called to reminded of her telephone appointment with Junius Argyle, CPP on 05/24/2022 @ 0915.  Patient aware of appointment date, time, and type of appointment (either telephone or in person). Patient aware to have/bring all medications, supplements, blood pressure and/or blood sugar logs to visit.  Star Rating Drug: Rosuvastatin 40 mg last filled on 03/18/2022 for a 90-Day supply with [facility]  Any gaps in medications fill history? None  Care Gaps: Zoster Vaccine Influenza Vaccine BP> 140/90   Lynann Bologna, CPA/CMA Clinical Pharmacist Assistant Phone: [phone]

## 2022-05-24 ENCOUNTER — Ambulatory Visit (INDEPENDENT_AMBULATORY_CARE_PROVIDER_SITE_OTHER): Payer: Medicare Other

## 2022-05-24 DIAGNOSIS — I10 Essential (primary) hypertension: Secondary | ICD-10-CM

## 2022-05-24 NOTE — Patient Instructions (Signed)
Visit Information It was great speaking with you today!  Please let me know if you have any questions about our visit.   Goals Addressed             This Visit's Progress    Track and Manage Fluids and Swelling-Heart Failure       Timeframe:  Long-Range Goal Priority:  High Start Date:  03/10/2021                            Expected End Date: 09/10/2022                       Follow Up within 90 days    - call office if I gain more than 2 pounds in one day or 5 pounds in one week - keep legs up while sitting - track weight in diary - use salt in moderation - watch for swelling in feet, ankles and legs every day    Why is this important?   It is important to check your weight daily and watch how much salt and liquids you have.  It will help you to manage your heart failure.    Notes:         Patient Care Plan: General Pharmacy (Adult)     Problem Identified: Hypertension, Hyperlipidemia, Atrial Fibrillation, Heart Failure, Coronary Artery Disease, Chronic Kidney Disease, Hypothyroidism, Depression, Anxiety and Peripheral Artery Disease   Priority: High     Long-Range Goal: Patient-Specific Goal   Start Date: 03/10/2021  Expected End Date: 05/24/2022  This Visit's Progress: On track  Recent Progress: On track  Priority: High  Note:   Current Barriers:  No barriers noted  Pharmacist Clinical Goal(s):  Patient will maintain control of blood pressure as evidenced by BP less than 140/90  through collaboration with PharmD and provider.   Interventions: 1:1 collaboration with Steele Sizer, MD regarding development and update of comprehensive plan of care as evidenced by provider attestation and co-signature Inter-disciplinary care team collaboration (see longitudinal plan of care) Comprehensive medication review performed; medication list updated in electronic medical record  Heart Failure (Goal: manage symptoms and prevent exacerbations) -Controlled -Last ejection  fraction: 60-65% (Date: 10/31/2020) -HF type: Diastolic -NYHA Class: II (slight limitation of activity) -AHA HF Stage: C (Heart disease and symptoms present) -Current treatment: Amlodipine 5 mg daily: Appropriate, Effective, Safe, Accessible Doxazosin 1 mg nightly: Appropriate, Effective, Safe, Accessible  Furosemide 20 mg daily: Appropriate, Effective, Safe, Accessible  -Medications previously tried: NA  -Current home BP/HR readings: 130-140s/60s  -Recommended to continue current medication  Atrial Fibrillation (Goal: prevent stroke and major bleeding) -Controlled -CHADSVASC: 6 -Current treatment: Rate/Rhthym control: Amiodarone 200 mg daily: Appropriate, Effective, Safe, Accessible  Anticoagulation: Eliquis 5 mg twice daily: Appropriate, Effective, Safe, Accessible  -Medications previously tried: NA -Recommended to continue current medication  Hyperlipidemia: (LDL goal < 70) -Controlled -Current treatment: Rosuvastatin 40 mg daily  -Medications previously tried: NA  -Educated on Importance of limiting foods high in cholesterol; -Recommended to continue current medication  Depression/Anxiety (Goal: Maintain stable mood) -Controlled -Current treatment: Buspirone 5 mg twice daily   -Medications previously tried/failed: NA -PHQ9: 0 -GAD7: 0 -Educated on Benefits of medication for symptom control -Recommended to continue current medication  Insomnia (Goal: Improve sleep quality) -Controlled -Current treatment  Melatonin 10 nightly (twice weekly): Appropriate, Effective, Safe, Accessible  Temazepam 15 mg nightly: Appropriate, Effective, Safe, Accessible  -Medications previously tried: NA  -  Recommended to continue current medication  Hypothyroidism (Goal: Maintain stable thyroid functon) -Controlled -Current treatment  Levothyroxine 137 mcg daily  -Medications previously tried: NA separates from other medications and food  -Recommended to continue current  medication  Depression/Anxiety (Goal: Achieve symptom remission) -Controlled -Current treatment: Buspirone 5 mg twice daily  Temazepam 15 mg nightly as needed  -Medications previously tried/failed: NA -GAD7: 0 -Recommended to continue current medication  Patient Goals/Self-Care Activities Patient will:  - check blood pressure 2-3 times weekly, document, and provide at future appointments  Follow Up Plan: No further follow up required: Patient will reach out to clinical team with further concerns.    Patient agreed to services and verbal consent obtained.   Patient verbalizes understanding of instructions and care plan provided today and agrees to view in [place]. Active MyChart status and patient understanding of how to access instructions and care plan via MyChart confirmed with patient.     Malva Limes, [place] Pharmacist Practitioner  [facility] [phone]

## 2022-05-24 NOTE — Progress Notes (Signed)
Chronic Care Management Pharmacy Note  05/24/2022 Name:  [name] MRN:  [id number] DOB:  30-Dec-1940  Summary: Patient presents for CCM follow-up.  Recommendations/Changes made from today's visit: Continue current medications  Plan: CPP follow-up 6 months   Subjective: [name] is an 81 y.o. year old female who is a primary patient of Steele Sizer, MD.  The CCM team was consulted for assistance with disease management and care coordination needs.    Engaged with patient by telephone for follow up visit in response to provider referral for pharmacy case management and/or care coordination services.   Consent to Services:  The patient was given information about Chronic Care Management services, agreed to services, and gave verbal consent prior to initiation of services.  Please see initial visit note for detailed documentation.   Patient Care Team: Steele Sizer, MD as PCP - General (Family Medicine) Minna Merritts, MD as PCP - Cardiology (Cardiology) Dingeldein, Remo Lipps, MD as Consulting Physician (Ophthalmology) Alisa Graff, FNP as Consulting Physician (Family Medicine) Ree Edman, MD (Dermatology) Minna Merritts, MD as Consulting Physician (Cardiology) Ashok Pall, MD as Consulting Physician (Neurosurgery) Marlaine Hind, MD as Consulting Physician (Physical Medicine and Rehabilitation) Billey Co, MD as Consulting Physician (Urology)  Recent office visits: 01/12/22: Patient presented to Dr. Ancil Boozer for follow-up. Stop metoprolol, temazepam nightly.   Recent consult visits: 12/28/21: Patient presented to Dr. Rockey Situ (Cardiology) BP 172/60.   Hospital visits: None in past 6 months    Objective:  Lab Results  Component Value Date   CREATININE 0.71 10/06/2021   BUN 13 10/06/2021   GFRNONAA >60 10/06/2021   GFRAA 72 12/01/2020   NA 135 10/06/2021   K 4.1 10/06/2021   CALCIUM 9.2 10/06/2021   CO2 24 10/06/2021   GLUCOSE 125  (H) 10/06/2021    Lab Results  Component Value Date/Time   HGBA1C 5.2 09/13/2020 12:00 AM   HGBA1C 5.0 03/30/2020 04:12 AM    Last diabetic Eye exam: No results found for: "HMDIABEYEEXA"  Last diabetic Foot exam: No results found for: "HMDIABFOOTEX"   Lab Results  Component Value Date   CHOL 148 09/14/2021   HDL 75 09/14/2021   LDLCALC 58 09/14/2021   TRIG 71 09/14/2021   CHOLHDL 2.0 09/14/2021       Latest Ref Rng & Units 09/14/2021    9:20 AM 11/21/2020    5:35 AM 11/19/2020   11:38 PM  Hepatic Function  Total Protein 6.1 - 8.1 g/dL 7.2  6.1  7.5   Albumin 3.5 - 5.0 g/dL  2.6  3.0   AST 10 - 35 U/L 41  26  38   ALT 6 - 29 U/L 36  21  29   Alk Phosphatase 38 - 126 U/L  64  86   Total Bilirubin 0.2 - 1.2 mg/dL 0.6  0.5  0.7     Lab Results  Component Value Date/Time   TSH 1.583 07/23/2021 12:30 PM   TSH 0.852 10/30/2020 04:03 AM   TSH 5.09 (H) 09/13/2020 12:00 AM   TSH 1.20 06/02/2019 08:36 AM   FREET4 1.2 01/10/2017 08:25 AM   FREET4 1.51 07/22/2015 09:09 AM       Latest Ref Rng & Units 10/06/2021    8:46 AM 07/24/2021    3:51 AM 07/23/2021    9:06 AM  CBC  WBC 4.0 - 10.5 K/uL 6.1  6.4  5.9   Hemoglobin 12.0 - 15.0 g/dL 13.7  13.0  11.9   Hematocrit 36.0 - 46.0 % 39.8  37.8  34.9   Platelets 150 - 400 K/uL 190  158  153     Lab Results  Component Value Date/Time   VD25OH 46 05/17/2017 11:43 AM    Clinical ASCVD: Yes  The 10-year ASCVD risk score (Arnett DK, et al., 2019) is: 42.5%   Values used to calculate the score:     Age: 68 years     Sex: Female     Is Non-Hispanic African American: No     Diabetic: No     Tobacco smoker: No     Systolic Blood Pressure: 553 mmHg     Is BP treated: Yes     HDL Cholesterol: 75 mg/dL     Total Cholesterol: 148 mg/dL       01/12/2022    8:14 AM 09/14/2021    8:24 AM 08/02/2021    1:05 PM  Depression screen PHQ 2/9  Decreased Interest 0 0 0  Down, Depressed, Hopeless 0 0 0  PHQ - 2 Score 0 0 0  Altered  sleeping 0 0   Tired, decreased energy 0 0   Change in appetite 0 0   Feeling bad or failure about yourself  0 0   Trouble concentrating 0 0   Moving slowly or fidgety/restless 0 0   Suicidal thoughts 0 0   PHQ-9 Score 0 0      Social History   Tobacco Use  Smoking Status Never  Smokeless Tobacco Never  Tobacco Comments   smoking cessation materials not required   BP Readings from Last 3 Encounters:  01/12/22 (!) 150/60  12/28/21 (!) 172/60  10/27/21 (!) 184/70   Pulse Readings from Last 3 Encounters:  01/12/22 78  12/28/21 75  10/27/21 66   Wt Readings from Last 3 Encounters:  01/12/22 122 lb (55.3 kg)  12/28/21 122 lb 8 oz (55.6 kg)  10/27/21 122 lb (55.3 kg)   BMI Readings from Last 3 Encounters:  01/12/22 19.69 kg/m  12/28/21 19.77 kg/m  10/27/21 19.69 kg/m    Assessment/Interventions: Review of patient past medical history, allergies, medications, health status, including review of consultants reports, laboratory and other test data, was performed as part of comprehensive evaluation and provision of chronic care management services.   SDOH:  (Social Determinants of Health) assessments and interventions performed: Yes     SDOH Screenings   Alcohol Screen: Low Risk  (05/26/2021)   Alcohol Screen    Last Alcohol Screening Score (AUDIT): 0  Depression (PHQ2-9): Low Risk  (01/12/2022)   Depression (PHQ2-9)    PHQ-2 Score: 0  Financial Resource Strain: Low Risk  (11/23/2021)   Overall Financial Resource Strain (CARDIA)    Difficulty of Paying Living Expenses: Not hard at all  Food Insecurity: No Food Insecurity (05/26/2021)   Hunger Vital Sign    Worried About Running Out of Food in the Last Year: Never true    Ran Out of Food in the Last Year: Never true  Housing: Low Risk  (05/26/2021)   Housing    Last Housing Risk Score: 0  Physical Activity: Inactive (05/26/2021)   Exercise Vital Sign    Days of Exercise per Week: 0 days    Minutes of Exercise per  Session: 0 min  Social Connections: Moderately Integrated (05/26/2021)   Social Connection and Isolation Panel [NHANES]    Frequency of Communication with Friends and Family: More than three times a week  Frequency of Social Gatherings with Friends and Family: More than three times a week    Attends Religious Services: More than 4 times per year    Active Member of Clubs or Organizations: Yes    Attends Archivist Meetings: More than 4 times per year    Marital Status: Widowed  Stress: No Stress Concern Present (05/26/2021)   [place]    Feeling of Stress : Only a little  Tobacco Use: Low Risk  (01/12/2022)   Patient History    Smoking Tobacco Use: Never    Smokeless Tobacco Use: Never    Passive Exposure: Not on file  Transportation Needs: No Transportation Needs (05/26/2021)   PRAPARE - Transportation    Lack of Transportation (Medical): No    Lack of Transportation (Non-Medical): No    CCM Care Plan  Allergies  Allergen Reactions   Chocolate Swelling   Penicillins Swelling and Rash    Has patient had a PCN reaction causing immediate rash, facial/tongue/throat swelling, SOB or lightheadedness with hypotension: Yes Has patient had a PCN reaction causing severe rash involving mucus membranes or skin necrosis: No Has patient had a PCN reaction that required hospitalization: No Has patient had a PCN reaction occurring within the last 10 years: No If all of the above answers are "NO", then may proceed with Cephalosporin use.    Codeine Nausea And Vomiting   Erythromycin Itching and Swelling   Septra [Sulfamethoxazole-Trimethoprim] Swelling    Medications Reviewed Today     Reviewed by Carlene Coria, CMA (Certified Medical Assistant) on 01/12/22 at [phone]  Med List Status: <None>   Medication Order Taking? Sig Documenting Provider Last Dose Status Informant  acetaminophen (TYLENOL) 650 MG CR tablet  [id number] Yes Take 650-1,300 mg by mouth every 8 (eight) hours as needed for pain.  [provider] Taking Active Self  amiodarone (PACERONE) 200 MG tablet [id number] Yes Take 1 tablet (200 mg total) by mouth daily. Rise Mu, PA-C Taking Active Self  amLODipine (NORVASC) 5 MG tablet [id number] Yes Take 1 tablet (5 mg total) by mouth daily. Furth, Cadence H, PA-C Taking Active   busPIRone (BUSPAR) 5 MG tablet [id number] Yes TAKE 1 TABLET(5 MG) BY MOUTH TWICE DAILY Sowles, Drue Stager, MD Taking Active   cholecalciferol (VITAMIN D) 1000 UNITS tablet [id number] Yes Take 2,000 Units by mouth daily. [provider] Taking Active Self  Cranberry 500 MG TABS [id number] Yes Take 2 tablets by mouth daily. [provider] Taking Active Self  doxazosin (CARDURA) 1 MG tablet [id number] Yes TAKE 1 TABLET(1 MG) BY MOUTH EVERY EVENING Gollan, Kathlene November, MD Taking Active   ELIQUIS 5 MG TABS tablet [id number] Yes TAKE 1 TABLET(5 MG) BY MOUTH TWICE DAILY Gollan, Kathlene November, MD Taking Active Self  furosemide (LASIX) 20 MG tablet [id number] Yes Take 20 mg by mouth. [provider] Taking Active Self  levothyroxine (SYNTHROID) 137 MCG tablet [id number] Yes TAKE 1 TABLET(137 MCG) BY MOUTH DAILY BEFORE Miachel Roux, DO Taking Active   loratadine (CLARITIN) 10 MG tablet [id number] Yes Take 10 mg by mouth daily as needed for allergies or rhinitis. [provider] Taking Active Self  Melatonin 10 MG TABS [id number] Yes Take 10 mg by mouth at bedtime as needed. [provider] Taking Active Self  metoprolol succinate (TOPROL-XL) 50 MG 24 hr tablet [id number] Yes  [provider] Taking Active   Multiple Vitamin ([place]) TABS [id number] Yes Take  1 tablet by mouth daily. [provider] Taking Active Self  potassium chloride (KLOR-CON) 10 MEQ tablet [id number] Yes TAKE 1 TABLET BY MOUTH EVERY DAY AS DIRECTED. MAY TAKE AN EXTRA TABLET AFTER  LUNCH WITH FLUID TABLET AS NEEDED FOR SWELLING Gollan, Kathlene November, MD Taking Active Self  rosuvastatin (CRESTOR) 40 MG tablet [id number] Yes TAKE 1 TABLET(40 MG) BY MOUTH DAILY Rockey Situ, Kathlene November, MD Taking Active   temazepam (RESTORIL) 15 MG capsule [id number] Yes TAKE 1 CAPSULE(15 MG) BY MOUTH AT BEDTIME AS NEEDED FOR SLEEP Steele Sizer, MD Taking Active             Patient Active Problem List   Diagnosis Date Noted   Basal cell carcinoma, face 01/12/2022   Senile purpura ([place]) 01/12/2022   Chronic a-fib ([place]) 01/12/2022   Orthostatic hypotension 07/24/2021   Bradycardia 07/24/2021   History of pyelonephritis    History of pneumonia 10/2020   Hydronephrosis of right kidney 03/29/2020   Depression 03/29/2020   Chronic combined systolic (congestive) and diastolic (congestive) heart failure ([place]) 05/24/2018   AF (paroxysmal atrial fibrillation) ([place]) 05/24/2018   CKD (chronic kidney disease), stage III ([place]) 05/17/2018   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   Pulmonary hypertension ([place]) 04/05/2018   Paroxysmal sinus tachycardia ([place]) 01/29/2018   Leg pain 01/04/2018   Carotid artery calcification, bilateral 12/19/2017   Cervical radiculitis 12/19/2017   Hyperglycemia 03/06/2016   Hypothyroidism in adult 04/21/2015   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   Anxiety 04/21/2015   Hyperlipidemia 04/21/2015   Diuretic-induced hypokalemia 04/21/2015   SVT (supraventricular tachycardia) ([place]) 05/27/2012   Hypertension 05/27/2012    Immunization History  Administered Date(s) Administered   Fluad Quad(high Dose 65+) 08/11/2019   Influenza, High Dose Seasonal PF 07/22/2015, 07/04/2018   Influenza-Unspecified 08/25/2016, 08/13/2017, 08/13/2020, 08/09/2021   PFIZER(Purple Top)SARS-COV-2 Vaccination 11/13/2019, 12/04/2019, 08/13/2020   Pfizer Covid-19 Vaccine Bivalent Booster 26yr & up 08/09/2021   Pneumococcal Conjugate-13 09/07/2016   Pneumococcal  Polysaccharide-23 12/04/2017    Conditions to be addressed/monitored:  Hypertension, Hyperlipidemia, Atrial Fibrillation, Heart Failure, Coronary Artery Disease, Chronic Kidney Disease, Hypothyroidism, Depression, Anxiety and Peripheral Artery Disease  Care Plan : General Pharmacy (Adult)  Updates made by FGermaine Pomfret RPH since 05/24/2022 12:00 AM     Problem: Hypertension, Hyperlipidemia, Atrial Fibrillation, Heart Failure, Coronary Artery Disease, Chronic Kidney Disease, Hypothyroidism, Depression, Anxiety and Peripheral Artery Disease   Priority: High     Long-Range Goal: Patient-Specific Goal   Start Date: 03/10/2021  Expected End Date: 05/24/2022  This Visit's Progress: On track  Recent Progress: On track  Priority: High  Note:   Current Barriers:  No barriers noted  Pharmacist Clinical Goal(s):  Patient will maintain control of blood pressure as evidenced by BP less than 140/90  through collaboration with PharmD and provider.   Interventions: 1:1 collaboration with SSteele Sizer MD regarding development and update of comprehensive plan of care as evidenced by provider attestation and co-signature Inter-disciplinary care team collaboration (see longitudinal plan of care) Comprehensive medication review performed; medication list updated in electronic medical record  Heart Failure (Goal: manage symptoms and prevent exacerbations) -Controlled -Last ejection fraction: 60-65% (Date: 10/31/2020) -HF type: Diastolic -NYHA Class: II (slight limitation of activity) -AHA HF Stage: C (Heart disease and symptoms present) -Current treatment: Amlodipine 5 mg daily: Appropriate, Effective, Safe, Accessible Doxazosin 1 mg nightly: Appropriate, Effective, Safe, Accessible  Furosemide 20 mg daily: Appropriate, Effective, Safe, Accessible  -Medications previously tried: NA  -Current home  BP/HR readings: 130-140s/60s  -Recommended to continue current medication  Atrial  Fibrillation (Goal: prevent stroke and major bleeding) -Controlled -CHADSVASC: 6 -Current treatment: Rate/Rhthym control: Amiodarone 200 mg daily: Appropriate, Effective, Safe, Accessible  Anticoagulation: Eliquis 5 mg twice daily: Appropriate, Effective, Safe, Accessible  -Medications previously tried: NA -Recommended to continue current medication  Hyperlipidemia: (LDL goal < 70) -Controlled -Current treatment: Rosuvastatin 40 mg daily  -Medications previously tried: NA  -Educated on Importance of limiting foods high in cholesterol; -Recommended to continue current medication  Depression/Anxiety (Goal: Maintain stable mood) -Controlled -Current treatment: Buspirone 5 mg twice daily   -Medications previously tried/failed: NA -PHQ9: 0 -GAD7: 0 -Educated on Benefits of medication for symptom control -Recommended to continue current medication  Insomnia (Goal: Improve sleep quality) -Controlled -Current treatment  Melatonin 10 nightly (twice weekly): Appropriate, Effective, Safe, Accessible  Temazepam 15 mg nightly: Appropriate, Effective, Safe, Accessible  -Medications previously tried: NA  -Recommended to continue current medication  Hypothyroidism (Goal: Maintain stable thyroid functon) -Controlled -Current treatment  Levothyroxine 137 mcg daily  -Medications previously tried: NA separates from other medications and food  -Recommended to continue current medication  Depression/Anxiety (Goal: Achieve symptom remission) -Controlled -Current treatment: Buspirone 5 mg twice daily  Temazepam 15 mg nightly as needed  -Medications previously tried/failed: NA -GAD7: 0 -Recommended to continue current medication  Patient Goals/Self-Care Activities Patient will:  - check blood pressure 2-3 times weekly, document, and provide at future appointments  Follow Up Plan: No further follow up required: Patient will reach out to clinical team with further concerns.      Medication Assistance: None required.  Patient affirms current coverage meets needs.  Patient's preferred pharmacy is:  [id number] [address], [address] AT [facility] [address] [place] [address] Phone: [phone] Fax: [phone]   Uses pill box? Yes Pt endorses 100% compliance  We discussed: Current pharmacy is preferred with insurance plan and patient is satisfied with pharmacy services Patient decided to: Continue current medication management strategy  Care Plan and Follow Up Patient Decision:  Patient agrees to Care Plan and Follow-up.  Plan: No further follow up required: Patient will reach out to clinical team with further concerns.  Malva Limes, [place] Pharmacist Practitioner  [facility] [phone]

## 2022-05-30 ENCOUNTER — Other Ambulatory Visit: Payer: Self-pay

## 2022-05-30 ENCOUNTER — Ambulatory Visit (INDEPENDENT_AMBULATORY_CARE_PROVIDER_SITE_OTHER): Payer: Medicare Other

## 2022-05-30 VITALS — Ht 66.0 in | Wt 121.0 lb

## 2022-05-30 DIAGNOSIS — Z0000 Encounter for general adult medical examination without abnormal findings: Secondary | ICD-10-CM | POA: Diagnosis not present

## 2022-05-30 NOTE — Progress Notes (Unsigned)
I connected with  [name] on 05/30/22 by a audio enabled telemedicine application and verified that I am speaking with the correct person using two identifiers.  Patient Location: Home  Provider Location: Office/Clinic  I discussed the limitations of evaluation and management by telemedicine. The patient expressed understanding and agreed to proceed.   Subjective:   [name] is a 81 y.o. female who presents for Medicare Annual (Subsequent) preventive examination.  Review of Systems    Per HPI unless specifically indicated below Cardiac Risk Factors include: advanced age (>30mn, >>18women); female gender, hypertension, CHF, and hyperlipidemia.        Objective:       05/30/2022   10:34 AM 01/12/2022    8:26 AM 01/12/2022    8:15 AM  Vitals with BMI  Height '5\' 6"'$   '5\' 6"'$   Weight 121 lbs  122 lbs  BMI 108.65 178.4 Systolic  [id number] Diastolic  60 62  Pulse   78    Today's Vitals   05/30/22 1034 05/30/22 1501  Weight: 121 lb (54.9 kg)   Height: '5\' 6"'$  (1.676 m)   PainSc:  4    Body mass index is 19.53 kg/m.     05/30/2022    3:11 PM 10/19/2021    7:47 AM 07/23/2021    9:01 AM 06/16/2021    9:51 AM 05/26/2021   10:15 AM 11/22/2020    9:48 AM 11/19/2020   11:35 PM  Advanced Directives  Does Patient Have a Medical Advance Directive? Yes Yes No No Yes Yes Yes  Type of AIndustrial/product designerof ACenter [place] will   [place] will Healthcare Power of [place] will  Does patient want to make changes to medical advance directive? No - Patient declined No - Patient declined    Yes (ED - send information to MyChart)   Copy of [place]ennertin Chart? No - copy requested No - copy requested   No - copy requested    Would patient like information on creating a medical advance directive?   No - Patient declined        Current Medications  (verified) Outpatient Encounter Medications as of 05/30/2022  Medication Sig   acetaminophen (TYLENOL) 650 MG CR tablet Take 650-1,300 mg by mouth every 8 (eight) hours as needed for pain.    amiodarone (PACERONE) 200 MG tablet TAKE 1 TABLET(200 MG) BY MOUTH DAILY   amLODipine (NORVASC) 5 MG tablet TAKE 1 TABLET(5 MG) BY MOUTH DAILY   busPIRone (BUSPAR) 5 MG tablet TAKE 1 TABLET(5 MG) BY MOUTH TWICE DAILY   cholecalciferol (VITAMIN D) 1000 UNITS tablet Take 2,000 Units by mouth daily.   Cranberry 500 MG TABS Take 2 tablets by mouth daily.   doxazosin (CARDURA) 1 MG tablet TAKE 1 TABLET(1 MG) BY MOUTH EVERY EVENING   ELIQUIS 5 MG TABS tablet TAKE 1 TABLET(5 MG) BY MOUTH TWICE DAILY   furosemide (LASIX) 20 MG tablet Take 1 tablet (20 mg total) by mouth daily.   levothyroxine (SYNTHROID) 137 MCG tablet TAKE 1 TABLET(137 MCG) BY MOUTH DAILY BEFORE BREAKFAST   loratadine (CLARITIN) 10 MG tablet Take 10 mg by mouth daily as needed for allergies or rhinitis.   Melatonin 10 MG TABS Take 10 mg by mouth at bedtime as needed.   Multiple Vitamin (MULITIVITAMIN WITH MINERALS) TABS Take 1 tablet by mouth daily.   potassium  chloride (KLOR-CON) 10 MEQ tablet TAKE 1 TABLET BY MOUTH EVERY DAY AS DIRECTED MAY TAKE AN EXTRA TABLET AFTER LUNCH WITH FLUID TABLET AS NEEDED SWELLING   rosuvastatin (CRESTOR) 40 MG tablet TAKE 1 TABLET(40 MG) BY MOUTH DAILY   temazepam (RESTORIL) 15 MG capsule Take 1 capsule (15 mg total) by mouth at bedtime.   No facility-administered encounter medications on file as of 05/30/2022.    Allergies (verified) Chocolate, Penicillins, Codeine, Erythromycin, and Septra [sulfamethoxazole-trimethoprim]   History: Past Medical History:  Diagnosis Date   Anxiety    Bronchitis    Chronic combined systolic (congestive) and diastolic (congestive) heart failure ([place])    a. 02/2018 Echo: EF 45-50%, diff HK, triv AI, mod MR, mildly dil LA/RA, nl RV fxn, mod TR. PASP 40-68mHg.   Chronic kidney  disease    DJD (degenerative joint disease), cervical    History of stress test    a. 01/2008 MV: EF 76%. Fair ex tol. No ischemia.   Hyperlipidemia    Hypertension    Hypothyroid    LBBB (left bundle branch block)    Leg pain    a. 01/2018 ABI's wnl.   Lumbar spondylosis    Persistent atrial fibrillation ([place]    a. 01/2018 Event monitor: 45 runs of SVT, longest 14.5 sec. SVT felt to be Afib/flutter-->2% burden. Longest run of AF 4h 271mCHA2DS2VASc = 5-->Xarelto); b. 03/2019 s/p DCCV; c. 04/2019 Repeat Amio load and DCCV.   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   Past Surgical History:  Procedure Laterality Date   BACK SURGERY  07/2019   CARDIOVERSION N/A 03/26/2018   Procedure: CARDIOVERSION;  Surgeon: EnNelva BushMD;  Location: [facility] ORS;  Service: Cardiovascular;  Laterality: N/A;   CARDIOVERSION N/A 04/24/2018   Procedure: CARDIOVERSION;  Surgeon: GoMinna MerrittsMD;  Location: [facility] ORS;  Service: Cardiovascular;  Laterality: N/A;   CATARACT EXTRACTION W/PHACO Right 07/12/2021   Procedure: CATARACT EXTRACTION PHACO AND INTRAOCULAR LENS PLACEMENT (IOC) RIGHT 16.92 01:30.8 ;  Surgeon: PoBirder RobsonMD;  [place]: [place] Service: Ophthalmology;  Laterality: Right;   ELECTROPHYSIOLOGY STUDY N/A 02/27/2012   Procedure: ELECTROPHYSIOLOGY STUDY;  Surgeon: GrEvans LanceMD;  [facility];  Service: Cardiovascular;  Laterality: N/A;   EPS and ablation for SVT  5/13   slow pathway ablation by Dr TaLovena Le EYE SURGERY     surgery for detatched retina   NECK SURGERY  02/2020   SUPRAVENTRICULAR TACHYCARDIA ABLATION N/A 02/27/2012   Procedure: SUPRAVENTRICULAR TACHYCARDIA ABLATION;  Surgeon: GrEvans LanceMD;  [facility];  Service: Cardiovascular;  Laterality: N/A;   TEE WITHOUT CARDIOVERSION N/A 10/19/2021   Procedure: TRANSESOPHAGEAL ECHOCARDIOGRAM (TEE);  Surgeon: AgKate SableMD;  Location: [facility]  ORS;  Service: Cardiovascular;  Laterality: N/A;   Family History  Problem Relation Age of Onset   Alzheimer's disease Mother    Stroke Father    Breast cancer Father    Heart disease Father        Pacemaker   Breast cancer Paternal Aunt    Kidney cancer Maternal Grandmother    Alzheimer's disease Maternal Grandfather    Thyroid disease Paternal Grandmother    Stroke Paternal Grandfather    Social History   Socioeconomic History   Marital status: Widowed    Spouse name: Not on file   Number of children: 1   Years of education: Not on  file   Highest education level: 12th grade  Occupational History   Occupation: Retired  Tobacco Use   Smoking status: Never   Smokeless tobacco: Never   Tobacco comments:    smoking cessation materials not required  Vaping Use   Vaping Use: Never used  Substance and Sexual Activity   Alcohol use: No   Drug use: No   Sexual activity: Not Currently    Partners: Male    Birth control/protection: Post-menopausal  Other Topics Concern   Not on file  Social History Narrative   Lives in [place], she has a boyfriend   She has cats, some inside, some outside    Social Determinants of Health   Financial Resource Strain: Low Risk  (05/30/2022)   Overall Financial Resource Strain (CARDIA)    Difficulty of Paying Living Expenses: Not hard at all  Food Insecurity: No Food Insecurity (05/30/2022)   Hunger Vital Sign    Worried About Running Out of Food in the Last Year: Never true    [place] in the Last Year: Never true  Transportation Needs: No Transportation Needs (05/30/2022)   PRAPARE - Hydrologist (Medical): No    Lack of Transportation (Non-Medical): No  Physical Activity: Sufficiently Active (05/30/2022)   Exercise Vital Sign    Days of Exercise per Week: 6 days    Minutes of Exercise per Session: 30 min  Stress: No Stress Concern Present (05/30/2022)   [place]    Feeling of Stress : Only a little  Social Connections: Moderately Isolated (05/30/2022)   Social Connection and Isolation Panel [NHANES]    Frequency of Communication with Friends and Family: Three times a week    Frequency of Social Gatherings with Friends and Family: Three times a week    Attends Religious Services: 1 to 4 times per year    Active Member of Clubs or Organizations: No    Attends Archivist Meetings: Never    Marital Status: Widowed    Tobacco Counseling Counseling given: Not Answered Tobacco comments: smoking cessation materials not required   Clinical Intake:  Pre-visit preparation completed: No  Pain : 0-10 Pain Score: 4  Pain Type: Chronic pain Pain Location: Back Pain Descriptors / Indicators: Aching, Constant Pain Onset: More than a month ago Pain Frequency: Constant Pain Relieving Factors: Tylenol and resting helps relieves the pain  Pain Relieving Factors: Tylenol and resting helps relieves the pain  Nutritional Status: BMI of 19-24  Normal Nutritional Risks: None Diabetes: No  How often do you need to have someone help you when you read instructions, pamphlets, or other written materials from your doctor or pharmacy?: 1 - Never  Diabetic?  Interpreter Needed?: No  Information entered by :: Donnie Mesa, CMA   Activities of Daily Living    05/30/2022   10:36 AM 01/12/2022    8:14 AM  In your present state of health, do you have any difficulty performing the following activities:  Hearing? 0 0  Vision? 0 0  Difficulty concentrating or making decisions? 0 0  Walking or climbing stairs? 0 1  Dressing or bathing? 0 0  Doing errands, shopping? 0 0    Patient Care Team: Steele Sizer, MD as PCP - General (Family Medicine) Minna Merritts, MD as PCP - Cardiology (Cardiology) Dingeldein, Remo Lipps, MD as Consulting Physician (Ophthalmology) Alisa Graff, FNP as Consulting Physician (Family  Medicine) [place],  Wilhemena Durie, MD (Dermatology) Minna Merritts, MD as Consulting Physician (Cardiology) Ashok Pall, MD as Consulting Physician (Neurosurgery) Marlaine Hind, MD as Consulting Physician (Physical Medicine and Rehabilitation) Billey Co, MD as Consulting Physician (Urology)  Indicate any recent Medical Services you may have received from other than [facility] providers in the past year (date may be approximate). 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  Hearing/Vision screen Denies any hearing issues. Denies any vision issues. Annual [facility]   Dietary issues and exercise activities discussed: Current Exercise Habits: Structured exercise class, Type of exercise: walking, Time (Minutes): 30, Frequency (Times/Week): 7, Weekly Exercise (Minutes/Week): 210, Intensity: Mild   Goals Addressed   None    Depression Screen    05/30/2022   10:35 AM 01/12/2022    8:14 AM 09/14/2021    8:24 AM 08/02/2021    1:05 PM 05/26/2021   10:14 AM 03/14/2021    7:49 AM 12/01/2020    3:26 PM  PHQ 2/9 Scores  PHQ - 2 Score 0 0 0 0 0 0 0  PHQ- 9 Score 0 0 0        Fall Risk    05/30/2022   10:35 AM 01/12/2022    8:14 AM 09/14/2021    8:24 AM 08/02/2021    1:05 PM 05/26/2021   10:17 AM  Fall Risk   Falls in the past year? 0 0 0 0 0  Number falls in past yr: 0 0 0  0  Injury with Fall? 0 0 0  0  Risk for fall due to :  No Fall Risks No Fall Risks  No Fall Risks  Follow up Falls evaluation completed Falls prevention discussed Falls prevention discussed Falls prevention discussed Falls prevention discussed    FALL RISK PREVENTION PERTAINING TO THE HOME:  Any stairs in or around the home? Yes  If so, are there any without handrails? No  Home free of loose throw rugs in walkways, pet beds, electrical cords, etc? Yes  Adequate lighting in your home to reduce risk of  falls? Yes   ASSISTIVE DEVICES UTILIZED TO PREVENT FALLS:  Life alert? No  Use of a cane, walker or w/c? No  Grab bars in the bathroom? Yes Shower chair or bench in shower? Yes  Elevated toilet seat or a handicapped toilet? Yes   TIMED UP AND GO:  Was the test performed? No , virtual visit  Cognitive Function:        05/30/2022    3:04 PM 05/25/2020   10:04 AM 05/23/2019   10:02 AM 05/17/2018    9:34 AM 03/12/2017    2:01 PM  6CIT Screen  What Year? 0 points 0 points 0 points 0 points 0 points  What month? 0 points 0 points 0 points 0 points 0 points  What time? 0 points 0 points 0 points 0 points 0 points  Count back from 20 0 points 0 points 0 points 0 points 0 points  Months in reverse 0 points 0 points 0 points 0 points 0 points  Repeat phrase 0 points 0 points 0 points 0 points 0 points  Total Score 0 points 0 points 0 points 0 points 0 points    Immunizations Immunization History  Administered Date(s) Administered   Fluad Quad(high Dose 65+) 08/11/2019   Influenza, High Dose Seasonal PF 07/22/2015, 07/04/2018   Influenza-Unspecified  08/25/2016, 08/13/2017, 08/13/2020, 08/09/2021   PFIZER(Purple Top)SARS-COV-2 Vaccination 11/13/2019, 12/04/2019, 08/13/2020   Pfizer Covid-19 Vaccine Bivalent Booster 50yr & up 08/09/2021   Pneumococcal Conjugate-13 09/07/2016   Pneumococcal Polysaccharide-23 12/04/2017    TDAP status: Due, Education has been provided regarding the importance of this vaccine. Advised may receive this vaccine at local pharmacy or Health Dept. Aware to provide a copy of the vaccination record if obtained from local pharmacy or Health Dept. Verbalized acceptance and understanding.  Flu Vaccine status: Up to date  Pneumonia Vaccines: Completed vaccines   Covid-19 vaccine status: Completed vaccines  Qualifies for Shingles Vaccine? Yes   Zostavax completed No   Shingrix Completed?: No.    Education has been provided regarding the importance of this  vaccine. Patient has been advised to call insurance company to determine out of pocket expense if they have not yet received this vaccine. Advised may also receive vaccine at local pharmacy or Health Dept. Verbalized acceptance and understanding.  Screening Tests Health Maintenance  Topic Date Due   INFLUENZA VACCINE  05/23/2022   TETANUS/TDAP  08/02/2022 (Originally 10/23/2017)   Zoster Vaccines- Shingrix (1 of 2) 08/30/2022 (Originally 08/14/1960)   Pneumonia Vaccine 80 Years old  Completed   DEXA SCAN  Completed   COVID-19 Vaccine  Completed   Hepatitis C Screening  Completed   HPV VACCINES  Aged Out    Health Maintenance  Health Maintenance Due  Topic Date Due   INFLUENZA VACCINE  05/23/2022   Colonoscopy: no longer required. Mammogram: no longer required Bone Density: done 06/12/17  Colorectal cancer screening: No longer required.   Mammogram status: No longer required due to 06/28/2017.    Lung Cancer Screening: (Low Dose CT Chest recommended if Age 80-80years, 30 pack-year currently smoking OR have quit w/in 15years.) does not qualify.   Lung Cancer Screening Referral: does not qualify  Additional Screening:  Hepatitis C Screening: does qualify; Completed 09/13/2020 Vision Screening: Recommended annual ophthalmology exams for early detection of glaucoma and other disorders of the eye. Is the patient up to date with their annual eye exam?  Yes  Who is the provider or what is the name of the office in which the patient [facility] If pt is not established with a provider, would they like to be referred to a provider to establish care? No .   Dental Screening: Recommended annual dental exams for proper oral hygiene  Community Resource Referral / Chronic Care Management: CRR required this visit?  No   CCM required this visit?  No      Plan:     I have personally reviewed and noted the following in the patient's chart:    Medical and social history Use of alcohol, tobacco or illicit drugs  Current medications and supplements including opioid prescriptions.  Functional ability and status Nutritional status Physical activity Advanced directives List of other physicians Hospitalizations, surgeries, and ER visits in previous 12 months Vitals Screenings to include cognitive, depression, and falls Referrals and appointments  In addition, I have reviewed and discussed with patient certain preventive protocols, quality metrics, and best practice recommendations. A written personalized care plan for preventive services as well as general preventive health recommendations were provided to patient.    Ms. PPotocki, Thank you for taking time to come for your Medicare Wellness Visit. I appreciate your ongoing commitment to your health goals. Please review the following plan we discussed and let me know if I  can assist you in the future.   These are the goals we discussed:  Goals      Exercise 150 min/wk Moderate Activity     Recommend to exercise for at least 150 minutes per week.     Increase water intake     Recommend increasing water intake to 5-6 glasses a day.     Track and Manage Fluids and Swelling-Heart Failure     Timeframe:  Long-Range Goal Priority:  High Start Date:  03/10/2021                            Expected End Date: 09/10/2022                       Follow Up within 90 days    - call office if I gain more than 2 pounds in one day or 5 pounds in one week - keep legs up while sitting - track weight in diary - use salt in moderation - watch for swelling in feet, ankles and legs every day    Why is this important?   It is important to check your weight daily and watch how much salt and liquids you have.  It will help you to manage your heart failure.    Notes:         This is a list of the screening recommended for you and due dates:  Health Maintenance  Topic Date Due   Flu  Shot  05/23/2022   Tetanus Vaccine  08/02/2022*   Zoster (Shingles) Vaccine (1 of 2) 08/30/2022*   Pneumonia Vaccine  Completed   DEXA scan (bone density measurement)  Completed   COVID-19 Vaccine  Completed   Hepatitis C Screening: USPSTF Recommendation to screen - Ages 62-79 yo.  Completed   HPV Vaccine  Aged Out  *Topic was postponed. The date shown is not the original due date.     Wilson Singer, [place]   05/30/2022   Nurse Notes: 30 minute non-face-to-face AWV

## 2022-05-30 NOTE — Patient Instructions (Signed)

## 2022-06-22 DIAGNOSIS — I4891 Unspecified atrial fibrillation: Secondary | ICD-10-CM

## 2022-06-22 DIAGNOSIS — E039 Hypothyroidism, unspecified: Secondary | ICD-10-CM | POA: Diagnosis not present

## 2022-06-22 DIAGNOSIS — E785 Hyperlipidemia, unspecified: Secondary | ICD-10-CM

## 2022-06-22 DIAGNOSIS — I5030 Unspecified diastolic (congestive) heart failure: Secondary | ICD-10-CM

## 2022-06-27 ENCOUNTER — Telehealth: Payer: Self-pay | Admitting: Cardiovascular Disease

## 2022-06-27 DIAGNOSIS — I482 Chronic atrial fibrillation, unspecified: Secondary | ICD-10-CM

## 2022-06-27 MED ORDER — APIXABAN 5 MG PO TABS: ORAL_TABLET | ORAL | 3 refills | Status: DC

## 2022-06-27 NOTE — Telephone Encounter (Signed)
Paper work obtained from nurse bin.  Placed on Dr. Donivan Scull desk for review and signature.  I did leave a note for Dr. Rockey Situ on the [name]'s paperwork:  Age: 81 yo (DOB- August 31, 2041) Weight: 55.3 kg (12/2021) Creatinine: 0.71 (10/06/21)  [name] is borderline for dose decrease for eliquis.

## 2022-06-27 NOTE — Telephone Encounter (Signed)
Patient came by office Dropped off [employer] forms to be completed and signed for Eliquis Placed in nurse box

## 2022-07-05 MED ORDER — APIXABAN 2.5 MG PO TABS: 2.5000 mg | ORAL_TABLET | Freq: Two times a day (BID) | ORAL | 3 refills | Status: DC

## 2022-07-05 NOTE — Telephone Encounter (Signed)
Reviewed the patient's weight/ kidney function/ age with Dr. Rockey Situ.   Per Dr. Rockey Situ, since the patient will be 81 next month and weight is < 60 kgs, ok to decrease Eliquis to 2.5 mg BID when sending her paperwork/ RX in for Patient Assistnce.   I have called and notified the patient of the recommendations from Dr. Rockey Situ as stated above.   She is aware she can continue with Eliquis 5 mg BID until her current RX is out. Per the patient, she has about 2 weeks worth of Eliquis. I have asked her to please reach out to [place] if she is down to 3-4 days of medication and we still do not have a determination from the company yet and we will see if we can provide samples of Eliquis 2.5 mg BID.   The patient voices understanding and is agreeable.

## 2022-07-05 NOTE — Telephone Encounter (Signed)
Application placed on Dr. Donivan Scull desk for review & signature.

## 2022-07-10 NOTE — Progress Notes (Unsigned)
Name: [name]   MRN: [id number]    DOB: Feb 10, 1941   Date:07/11/2022       Progress Note  Subjective  Chief Complaint  Follow Up  HPI  GAD: doing well on Buspar , she takes every am and occasionally in the afternoon , she has been taking for many years and no side effects , Temazepam at night for sleep.   CKI stage III; good urine output, denies pruritis, last GFR was 52 , we will recheck labs today   Hypothyroidism: she has been on the same dose for a long time 137 mcg daily  compliant, no dysphagia, hair loss  or change in bowel movements  Skin Cancer: seen by Dr. Phillip Heal, diagnosed with basal cell carcinoma on left side of nose this year, she goes every 6 months for follow up  S/p neck fusion: it was done by Dr. Larose Hires April 2021 and was  doing well, symptoms started to bother her again had repeat MRI c-spine and lumbar spine 2022 , is now seeing Dr. Gean Quint [facility]. She has  injections on her neck and back,every few months to control pain, she also had ablation done with mild improvement. Pain is daily 5/10 and constant on her neck, back is intermittent worse when standing and when walking a long time. It also affects her balance but therapy did not help in the past, no falls.    Carotid calcification: found on x-ray of neck. She also had a vascular study done back in 2011 that showed mild plaque formation on right. She is now on Crestor and  Eliquis. Off aspirin since ablation 04/2018. She denies headache or dizziness or double vision  Dr. Larose Hires thinks balanced disturbance is likely from neck surgery. She went back to Dr. Erven Colla and had ABI that was normal, carotid doppler was due in 2021, but Dr. Rockey Situ told her not to worry about doing it at this time. She also has a history of pulmonary hypertension - under the care of Dr. Rockey Situ  History of SVT but doing well on diuretics prn and amiodarone  Continue current medications, Eliquis dose recently decreased  by Dr. Rockey Situ due to her age   HTN: bp here is high but at goal at home around 130, she has white coat syndrome on top of HTN. She is only taking Norvasc 5 mg and cardura and bp today a little elevated but always good at home.    Afib : she had a cardioversion in June 2019  and another one in July 2019 , she has intermittent palpitation  but not episode lately.  She is on amiodarone for rhythm control also takes Eliquis. No palpitation of chest pain . Doing well    CHF chronic: she is down to one pillow at night. She has mild sob with moderate activity ,  no chest pain, she has intermittent lower extremity edema and is taking extra lasix and potassium at lunch prn as instructed by Dr. Rockey Situ . Off ARB and Beta blockers due to hypotension, but is taking Eliquis cardura and norvasc    Senile Purpura: both arms and legs, on Eliquis. Unchanged   Recurrent UTI:no recent episodes, she sees Urologist once a year She takes cranberry pills    Patient Active Problem List   Diagnosis Date Noted   Basal cell carcinoma, face 01/12/2022   Senile purpura ([place]) 01/12/2022   Chronic a-fib ([place]) 01/12/2022   Orthostatic hypotension 07/24/2021   Bradycardia 07/24/2021  History of pyelonephritis    History of pneumonia 10/2020   Hydronephrosis of right kidney 03/29/2020   Depression 03/29/2020   Chronic combined systolic (congestive) and diastolic (congestive) heart failure ([place]) 05/24/2018   AF (paroxysmal atrial fibrillation) ([place]) 05/24/2018   CKD (chronic kidney disease), stage III ([place]) 05/17/2018   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   Pulmonary hypertension ([place]) 04/05/2018   Paroxysmal sinus tachycardia ([place]) 01/29/2018   Leg pain 01/04/2018   Carotid artery calcification, bilateral 12/19/2017   Cervical radiculitis 12/19/2017   Hyperglycemia 03/06/2016   Hypothyroidism in adult 04/21/2015   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   Anxiety 04/21/2015    Hyperlipidemia 04/21/2015   Diuretic-induced hypokalemia 04/21/2015   SVT (supraventricular tachycardia) ([place]) 05/27/2012   Hypertension 05/27/2012    Past Surgical History:  Procedure Laterality Date   BACK SURGERY  07/2019   CARDIOVERSION N/A 03/26/2018   Procedure: CARDIOVERSION;  Surgeon: Nelva Bush, MD;  Location: [facility] ORS;  Service: Cardiovascular;  Laterality: N/A;   CARDIOVERSION N/A 04/24/2018   Procedure: CARDIOVERSION;  Surgeon: Minna Merritts, MD;  Location: [facility] OR[facility];  Service: Cardiovascular;  Laterality: N/A;   CATARACT EXTRACTION W/PHACO Right 07/12/2021   Procedure: CATARACT EXTRACTION PHACO AND INTRAOCULAR LENS PLACEMENT (IOC) RIGHT 16.92 01:30.8 ;  Surgeon: Birder Robson, MD;  Location: [place];  Service: Ophthalmology;  Laterality: Right;   ELECTROPHYSIOLOGY STUDY N/A 02/27/2012   Procedure: ELECTROPHYSIOLOGY STUDY;  Surgeon: Evans Lance, MD;  Location: [facility] CATH LAB;  Service: Cardiovascular;  Laterality: N/A;   EPS and ablation for SVT  5/13   slow pathway ablation by Dr Lovena Le   EYE SURGERY     surgery for detatched retina   NECK SURGERY  02/2020   SUPRAVENTRICULAR TACHYCARDIA ABLATION N/A 02/27/2012   Procedure: SUPRAVENTRICULAR TACHYCARDIA ABLATION;  Surgeon: Evans Lance, MD;  Location[facility] C[facility] LAB;  Service: Cardiovascular;  Laterality: N/A;   TEE WITHOUT CARDIOVERSION N/A 10/19/2021   Procedure: TRANSESOPHAGEAL ECHOCARDIOGRAM (TEE);  Surgeon: Kate Sable, MD;  Location: [facility] ORS;  Service: Cardiovascular;  Laterality: N/A;    Family History  Problem Relation Age of Onset   Alzheimer's disease Mother    Stroke Father    Breast cancer Father    Heart disease Father        Pacemaker   Breast cancer Paternal Aunt    Kidney cancer Maternal Grandmother    Alzheimer's disease Maternal Grandfather    Thyroid disease Paternal Grandmother    Stroke Paternal Grandfather     Social History   Tobacco Use   Smoking status: Never    Smokeless tobacco: Never   Tobacco comments:    smoking cessation materials not required  Substance Use Topics   Alcohol use: No     Current Outpatient Medications:    acetaminophen (TYLENOL) 650 MG CR tablet, Take 650-1,300 mg by mouth every 8 (eight) hours as needed for pain. , Disp: , Rfl:    amiodarone (PACERONE) 200 MG tablet, TAKE 1 TABLET(200 MG) BY MOUTH DAILY, Disp: 90 tablet, Rfl: 3   amLODipine (NORVASC) 5 MG tablet, TAKE 1 TABLET(5 MG) BY MOUTH DAILY, Disp: 30 tablet, Rfl: 7   apixaban (ELIQUIS) 2.5 MG TABS tablet, Take 1 tablet (2.5 mg total) by mouth 2 (two) times daily., Disp: 180 tablet, Rfl: 3   busPIRone (BUSPAR) 5 MG tablet, TAKE 1 TABLET(5 MG) BY MOUTH TWICE DAILY, Disp: 180 tablet, Rfl: 1   cholecalciferol (VITAMIN D) 1000 UNITS tablet, Take 2,000  Units by mouth daily., Disp: , Rfl:    Cranberry 500 MG TABS, Take 2 tablets by mouth daily., Disp: , Rfl:    doxazosin (CARDURA) 1 MG tablet, TAKE 1 TABLET(1 MG) BY MOUTH EVERY EVENING, Disp: 90 tablet, Rfl: 1   furosemide (LASIX) 20 MG tablet, Take 1 tablet (20 mg total) by mouth daily., Disp: 90 tablet, Rfl: 0   levothyroxine (SYNTHROID) 137 MCG tablet, TAKE 1 TABLET(137 MCG) BY MOUTH DAILY BEFORE BREAKFAST, Disp: 90 tablet, Rfl: 1   loratadine (CLARITIN) 10 MG tablet, Take 10 mg by mouth daily as needed for allergies or rhinitis., Disp: , Rfl:    Melatonin 10 MG TABS, Take 10 mg by mouth at bedtime as needed., Disp: , Rfl:    Multiple Vitamin (MULITIVITAMIN WITH MINERALS) TABS, Take 1 tablet by mouth daily., Disp: , Rfl:    potassium chloride (KLOR-CON) 10 MEQ tablet, TAKE 1 TABLET BY MOUTH EVERY DAY AS DIRECTED MAY TAKE AN EXTRA TABLET AFTER LUNCH WITH FLUID TABLET AS NEEDED SWELLING, Disp: 180 tablet, Rfl: 1   rosuvastatin (CRESTOR) 40 MG tablet, TAKE 1 TABLET(40 MG) BY MOUTH DAILY, Disp: 90 tablet, Rfl: 3   temazepam (RESTORIL) 15 MG capsule, Take 1 capsule (15 mg total) by mouth at bedtime., Disp: 90 capsule, Rfl:  1  Allergies  Allergen Reactions   Chocolate Swelling   Penicillins Swelling and Rash    Has patient had a PCN reaction causing immediate rash, facial/tongue/throat swelling, SOB or lightheadedness with hypotension: Yes Has patient had a PCN reaction causing severe rash involving mucus membranes or skin necrosis: No Has patient had a PCN reaction that required hospitalization: No Has patient had a PCN reaction occurring within the last 10 years: No If all of the above answers are "NO", then may proceed with Cephalosporin use.    Codeine Nausea And Vomiting   Erythromycin Itching and Swelling   Septra [Sulfamethoxazole-Trimethoprim] Swelling    I personally reviewed active problem list, medication list, allergies, family history, social history, health maintenance with the patient/caregiver today.   ROS  Constitutional: Negative for fever or weight change.  Respiratory: Negative for cough but has  shortness of breath. With moderate activity   Cardiovascular: Negative for chest pain or palpitations.  Gastrointestinal: Negative for abdominal pain, no bowel changes.  Musculoskeletal: positive for gait problem ( when walking a long distance)  or joint swelling.  Skin: Negative for rash.  Neurological: Negative for dizziness or headache.  No other specific complaints in a complete review of systems (except as listed in HPI above).   Objective  Vitals:   07/11/22 0831 07/11/22 0844  BP: (!) 166/70 (!) 154/68  Pulse: 77   Resp: 16   SpO2: 98%   Weight: 125 lb (56.7 kg)   Height: '5\' 6"'$  (1.676 m)     Body mass index is 20.18 kg/m.  Physical Exam  Constitutional: Patient appears well-developed and well-nourished  No distress.  HEENT: head atraumatic, normocephalic, pupils equal and reactive to light, neck supple, throat within normal limits Cardiovascular: Normal rate, regular rhythm and normal heart sounds.  No murmur heard. No BLE edema. Pulmonary/Chest: Effort normal and  breath sounds normal. No respiratory distress. Abdominal: Soft.  There is no tenderness. Psychiatric: Patient has a normal mood and affect. behavior is normal. Judgment and thought content normal.   PHQ2/9:    07/11/2022    8:31 AM 05/30/2022   10:35 AM 01/12/2022    8:14 AM 09/14/2021    8:24 AM  08/02/2021    1:05 PM  Depression screen PHQ 2/9  Decreased Interest 0 0 0 0 0  Down, Depressed, Hopeless 0 0 0 0 0  PHQ - 2 Score 0 0 0 0 0  Altered sleeping 0 0 0 0   Tired, decreased energy 0 0 0 0   Change in appetite 0 0 0 0   Feeling bad or failure about yourself  0 0 0 0   Trouble concentrating 0 0 0 0   Moving slowly or fidgety/restless 0 0 0 0   Suicidal thoughts 0 0 0 0   PHQ-9 Score 0 0 0 0   Difficult doing work/chores  Not difficult at all       phq 9 is negative   Fall Risk:    07/11/2022    8:30 AM 05/30/2022   10:35 AM 01/12/2022    8:14 AM 09/14/2021    8:24 AM 08/02/2021    1:05 PM  Fall Risk   Falls in the past year? 0 0 0 0 0  Number falls in past yr: 0 0 0 0   Injury with Fall? 0 0 0 0   Risk for fall due to : No Fall Risks  No Fall Risks No Fall Risks   Follow up Falls prevention discussed Falls evaluation completed Falls prevention discussed Falls prevention discussed Falls prevention discussed      Functional Status Survey: Is the patient deaf or have difficulty hearing?: No Does the patient have difficulty seeing, even when wearing glasses/contacts?: No Does the patient have difficulty concentrating, remembering, or making decisions?: No Does the patient have difficulty walking or climbing stairs?: No Does the patient have difficulty dressing or bathing?: No Does the patient have difficulty doing errands alone such as visiting a doctor's office or shopping?: No    Assessment & Plan  1. AF (paroxysmal atrial fibrillation) (HCC)  Sinus rhythm today  2. Stage 3a chronic kidney disease (HCC)  We will recheck labs today   3. Need for immunization  against influenza  - Flu Vaccine QUAD High Dose(Fluad)  4. Chronic combined systolic (congestive) and diastolic (congestive) heart failure ([place])   5. Pulmonary hypertension ([place])  Managed by cardiologist   6. Senile purpura (HCC)  Stable and reassurance given   7. Hypothyroidism in adult  - levothyroxine (SYNTHROID) 137 MCG tablet; Take 1 tablet (137 mcg total) by mouth daily before breakfast.  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  Dispense: 90 capsule; Refill: 1  9. GAD (generalized anxiety disorder)   10. Essential hypertension  - CBC with Differential/Platelet  11. Long-term use of high-risk medication  - COMPLETE METABOLIC PANEL WITH GFR - CBC with Differential/Platelet  12. Chronic pain syndrome  Keep follow up with pain clinic   13. Dyslipidemia  - Lipid panel

## 2022-07-11 ENCOUNTER — Encounter: Payer: Self-pay | Admitting: Family Medicine

## 2022-07-11 ENCOUNTER — Ambulatory Visit (INDEPENDENT_AMBULATORY_CARE_PROVIDER_SITE_OTHER): Payer: Medicare Other | Admitting: Family Medicine

## 2022-07-11 VITALS — BP 154/68 | HR 77 | Resp 16 | Ht 66.0 in | Wt 125.0 lb

## 2022-07-11 DIAGNOSIS — I5042 Chronic combined systolic (congestive) and diastolic (congestive) heart failure: Secondary | ICD-10-CM | POA: Diagnosis not present

## 2022-07-11 DIAGNOSIS — F411 Generalized anxiety disorder: Secondary | ICD-10-CM

## 2022-07-11 DIAGNOSIS — I272 Pulmonary hypertension, unspecified: Secondary | ICD-10-CM

## 2022-07-11 DIAGNOSIS — Z23 Encounter for immunization: Secondary | ICD-10-CM

## 2022-07-11 DIAGNOSIS — I10 Essential (primary) hypertension: Secondary | ICD-10-CM

## 2022-07-11 DIAGNOSIS — G894 Chronic pain syndrome: Secondary | ICD-10-CM

## 2022-07-11 DIAGNOSIS — N1831 Chronic kidney disease, stage 3a: Secondary | ICD-10-CM

## 2022-07-11 DIAGNOSIS — E039 Hypothyroidism, unspecified: Secondary | ICD-10-CM

## 2022-07-11 DIAGNOSIS — Z79899 Other long term (current) drug therapy: Secondary | ICD-10-CM

## 2022-07-11 DIAGNOSIS — F409 Phobic anxiety disorder, unspecified: Secondary | ICD-10-CM

## 2022-07-11 DIAGNOSIS — D692 Other nonthrombocytopenic purpura: Secondary | ICD-10-CM

## 2022-07-11 DIAGNOSIS — F5105 Insomnia due to other mental disorder: Secondary | ICD-10-CM

## 2022-07-11 DIAGNOSIS — I480 Paroxysmal atrial fibrillation: Secondary | ICD-10-CM

## 2022-07-11 DIAGNOSIS — E785 Hyperlipidemia, unspecified: Secondary | ICD-10-CM

## 2022-07-11 MED ORDER — TEMAZEPAM 15 MG PO CAPS: 15.0000 mg | ORAL_CAPSULE | Freq: Every day | ORAL | 1 refills | Status: DC

## 2022-07-11 MED ORDER — LEVOTHYROXINE SODIUM 137 MCG PO TABS: 137.0000 ug | ORAL_TABLET | Freq: Every day | ORAL | 1 refills | Status: DC

## 2022-07-11 NOTE — Telephone Encounter (Signed)
[employer] PA application, completed and signed by Dr. Rockey Situ. Faxed to [place] at [phone]. Confirmation received.  Paperwork placed in file cabinet for assistance.

## 2022-07-12 LAB — COMPLETE METABOLIC PANEL WITH GFR
AG Ratio: 2.1 (calc) (ref 1.0–2.5)
ALT: 18 U/L (ref 6–29)
AST: 24 U/L (ref 10–35)
Albumin: 4.9 g/dL (ref 3.6–5.1)
Alkaline phosphatase (APISO): 70 U/L (ref 37–153)
BUN: 14 mg/dL (ref 7–25)
CO2: 25 mmol/L (ref 20–32)
Calcium: 10 mg/dL (ref 8.6–10.4)
Chloride: 103 mmol/L (ref 98–110)
Creat: 0.81 mg/dL (ref 0.60–1.00)
Globulin: 2.3 g/dL (calc) (ref 1.9–3.7)
Glucose, Bld: 132 mg/dL — ABNORMAL HIGH (ref 65–99)
Potassium: 4.8 mmol/L (ref 3.5–5.3)
Sodium: 142 mmol/L (ref 135–146)
Total Bilirubin: 0.6 mg/dL (ref 0.2–1.2)
Total Protein: 7.2 g/dL (ref 6.1–8.1)
eGFR: 74 mL/min/{1.73_m2} (ref >=60)

## 2022-07-12 LAB — CBC WITH DIFFERENTIAL/PLATELET
Absolute Monocytes: 555 cells/uL (ref 200–950)
Basophils Absolute: 29 cells/uL (ref 0–200)
Basophils Relative: 0.4 %
Eosinophils Absolute: 58 cells/uL (ref 15–500)
Eosinophils Relative: 0.8 %
HCT: 41 % (ref 35.0–45.0)
Hemoglobin: 13.8 g/dL (ref 11.7–15.5)
Lymphs Abs: 1015 cells/uL (ref 850–3900)
MCH: 33 pg (ref 27.0–33.0)
MCHC: 33.7 g/dL (ref 32.0–36.0)
MCV: 98.1 fL (ref 80.0–100.0)
MPV: 9 fL (ref 7.5–12.5)
Monocytes Relative: 7.6 %
Neutro Abs: 5643 cells/uL (ref 1500–7800)
Neutrophils Relative %: 77.3 %
Platelets: 240 10*3/uL (ref 140–400)
RBC: 4.18 10*6/uL (ref 3.80–5.10)
RDW: 13 % (ref 11.0–15.0)
Total Lymphocyte: 13.9 %
WBC: 7.3 10*3/uL (ref 3.8–10.8)

## 2022-07-12 LAB — LIPID PANEL
Cholesterol: 147 mg/dL (ref <200)
HDL: 76 mg/dL (ref >=50)
LDL Cholesterol (Calc): 54 mg/dL (calc)
Non-HDL Cholesterol (Calc): 71 mg/dL (calc) (ref <130)
Total CHOL/HDL Ratio: 1.9 (calc) (ref <5.0)
Triglycerides: 90 mg/dL (ref <150)

## 2022-07-12 LAB — TSH: TSH: 0.6 mIU/L (ref 0.40–4.50)

## 2022-08-09 ENCOUNTER — Other Ambulatory Visit: Payer: Self-pay | Admitting: Family Medicine

## 2022-08-09 DIAGNOSIS — F411 Generalized anxiety disorder: Secondary | ICD-10-CM

## 2022-08-09 NOTE — Telephone Encounter (Signed)
Requested Prescriptions  Pending Prescriptions Disp Refills  . busPIRone (BUSPAR) 5 MG tablet [Pharmacy Med Name: BUSPIRONE '5MG'$  TABLETS] 180 tablet 1    Sig: TAKE 1 TABLET(5 MG) BY MOUTH TWICE DAILY     Psychiatry: Anxiolytics/Hypnotics - Non-controlled Passed - 08/09/2022  5:03 PM      Passed - Valid encounter within last 12 months    Recent Outpatient Visits          4 weeks ago AF (paroxysmal atrial fibrillation) [facility])   [facility] Steele Sizer, MD   6 months ago Chronic a-fib [facility])   [facility] Steele Sizer, MD   10 months ago Chronic a-fib [facility])   [facility] Steele Sizer, MD   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

## 2022-08-12 ENCOUNTER — Other Ambulatory Visit: Payer: Self-pay | Admitting: Physician Assistant

## 2022-08-16 ENCOUNTER — Telehealth: Payer: Self-pay | Admitting: Cardiovascular Disease

## 2022-08-16 MED ORDER — FUROSEMIDE 20 MG PO TABS: 20.0000 mg | ORAL_TABLET | Freq: Every day | ORAL | 2 refills | Status: DC

## 2022-08-16 NOTE — Telephone Encounter (Signed)
Requested Prescriptions   Signed Prescriptions Disp Refills   furosemide (LASIX) 20 MG tablet 90 tablet 2    Sig: Take 1 tablet (20 mg total) by mouth daily.    Authorizing Provider: Minna Merritts    Ordering User: Britt Bottom

## 2022-08-16 NOTE — Telephone Encounter (Signed)
*  STAT* If patient is at the pharmacy, call can be transferred to refill team.   1. Which medications need to be refilled? (please list name of each medication and dose if known)  furosemide (LASIX) 20 MG tablet  2. Which pharmacy/location (including street and city if local pharmacy) is medication to be sent to? [facility] [place], [address] AT [facility] OF [address]  3. Do they need a 30 day or 90 day supply? 90 day  Patient has enough to make it to Friday

## 2022-10-24 ENCOUNTER — Ambulatory Visit
Admission: EM | Admit: 2022-10-24 | Discharge: 2022-10-24 | Disposition: A | Discharge status: Home / Self Care | Payer: Medicare Other | Attending: Physician Assistant | Admitting: Physician Assistant

## 2022-10-24 DIAGNOSIS — R051 Acute cough: Secondary | ICD-10-CM | POA: Diagnosis not present

## 2022-10-24 DIAGNOSIS — J029 Acute pharyngitis, unspecified: Secondary | ICD-10-CM

## 2022-10-24 DIAGNOSIS — U071 COVID-19: Secondary | ICD-10-CM

## 2022-10-24 LAB — GROUP A STREP BY PCR: Group A Strep by PCR: NOT DETECTED

## 2022-10-24 LAB — RESP PANEL BY RT-PCR (RSV, FLU A&B, COVID)  RVPGX2
Influenza A by PCR: NEGATIVE
Influenza B by PCR: NEGATIVE
Resp Syncytial Virus by PCR: NEGATIVE
SARS Coronavirus 2 by RT PCR: POSITIVE — AB

## 2022-10-24 MED ORDER — BENZONATATE 200 MG PO CAPS: 200.0000 mg | ORAL_CAPSULE | Freq: Three times a day (TID) | ORAL | 0 refills | Status: DC | PRN

## 2022-10-24 MED ORDER — MOLNUPIRAVIR 200 MG PO CAPS: 4.0000 | ORAL_CAPSULE | Freq: Two times a day (BID) | ORAL | 0 refills | Status: AC

## 2022-10-24 NOTE — Discharge Instructions (Signed)
-  Your COVID test is positive.  You need to isolate 5 days from symptom onset and wear mask for 5 days. - I sent antiviral medication to pharmacy so start this right away.  Also sent cough medication as needed.  You can also take Coricidin HBP and make sure you are increasing rest and fluids and continue Tylenol as needed. - If you have any uncontrolled fever, weakness or shortness of breath you need to be seen again right away.

## 2022-10-24 NOTE — ED Triage Notes (Signed)
Here for a sore throat and cough x 2 days.Pt reports she feels better today but wanted to be check.

## 2022-10-24 NOTE — ED Provider Notes (Signed)
[facility]    CSN: [id number] Arrival date & time: 10/24/22  0858      History   Chief Complaint Chief Complaint  Patient presents with   Cough   Sore Throat    HPI [name] is a 82 y.o. female presenting for 2-day history of fatigue, cough, congestion, sore throat.  Reports occasional shortness of breath.  She denies any fever but says she takes Tylenol every day for arthritic pain.  She says her husband was sick with the same symptoms on a fever last week but he was not checked out.  She reports taking over-the-counter cough medication as needed for symptoms.  No known COVID or flu exposure.  Reports receiving vaccines for COVID, flu and RSV.  She has a history of CHF, CKD, hypertension, hyperlipidemia, A-fib and SVT.  No history of lung disease.  HPI  Past Medical History:  Diagnosis Date   Anxiety    Bronchitis    Chronic combined systolic (congestive) and diastolic (congestive) heart failure ([place])    a. 02/2018 Echo: EF 45-50%, diff HK, triv AI, mod MR, mildly dil LA/RA, nl RV fxn, mod TR. PASP 40-79mHg.   Chronic kidney disease    DJD (degenerative joint disease), cervical    History of stress test    a. 01/2008 MV: EF 76%. Fair ex tol. No ischemia.   Hyperlipidemia    Hypertension    Hypothyroid    LBBB (left bundle branch block)    Leg pain    a. 01/2018 ABI's wnl.   Lumbar spondylosis    Persistent atrial fibrillation ([facility]    a. 01/2018 Event monitor: 45 runs of SVT, longest 14.5 sec. SVT felt to be Afib/flutter-->2% burden. Longest run of AF 4h 27mCHA2DS2VASc = 5-->Xarelto); b. 03/2019 s/p DCCV; c. 04/2019 Repeat Amio load and DCCV.   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    Patient Active Problem List   Diagnosis Date Noted   Basal cell carcinoma, face 01/12/2022   Senile purpura (HCGoree03/23/2023   Chronic a-fib (HCCar[place]03/23/2023   Orthostatic hypotension 07/24/2021   Bradycardia 07/24/2021    History of pyelonephritis    History of pneumonia 10/2020   Hydronephrosis of right kidney 03/29/2020   Depression 03/29/2020   Chronic combined systolic (congestive) and diastolic (congestive) heart failure (HC[facility]08/11/2017   AF (paroxysmal atrial fibrillation) (HCV[place]08/11/2017   CKD (chronic kidney disease), stage III (HCP[place]ryn07/26/2019   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   Pulmonary hypertension (HCR[place]06/14/2019   Paroxysmal sinus tachycardia 01/29/2018   Leg pain 01/04/2018   Carotid artery calcification, bilateral 12/19/2017   Cervical radiculitis 12/19/2017   Hyperglycemia 03/06/2016   Hypothyroidism in adult 04/21/2015   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   Anxiety 04/21/2015   Hyperlipidemia 04/21/2015   Diuretic-induced hypokalemia 04/21/2015   SVT (supraventricular tachycardia) 05/27/2012   Hypertension 05/27/2012    Past Surgical History:  Procedure Laterality Date   BACK SURGERY  07/2019   CARDIOVERSION N/A 03/26/2018   Procedure: CARDIOVERSION;  Surgeon: EnNelva BushMD;  Location: [facility];  Service: Cardiovascular;  Laterality: N/A;   CARDIOVERSION N/A 04/24/2018   Procedure: CARDIOVERSION;  Surgeon: GoMinna MerrittsMD;  Location: [facility];  Service: Cardiovascular;  Laterality: N/A;   CATARACT EXTRACTION W/PHACO Right 07/12/2021   Procedure: CATARACT EXTRACTION PHACO AND INTRAOCULAR LENS PLACEMENT (IOC) RIGHT 16.92 01:30.8 ;  Surgeon: PoBirder RobsonMD;  Location: [facility];  Service: Ophthalmology;  Laterality: Right;   ELECTROPHYSIOLOGY STUDY N/A 02/27/2012   Procedure: ELECTROPHYSIOLOGY STUDY;  Surgeon: Evans Lance, MD;  Location: [facility] CATH LAB;  Service: Cardiovascular;  Laterality: N/A;   EPS and ablation for SVT  5/13   slow pathway ablation by Dr Lovena Le   EYE SURGERY     surgery for detatched retina   NECK SURGERY  02/2020   SUPRAVENTRICULAR TACHYCARDIA ABLATION N/A 02/27/2012   Procedure: SUPRAVENTRICULAR  TACHYCARDIA ABLATION;  Surgeon: Evans Lance, MD;  Location: [facility] CATH LAB;  Service: Cardiovascular;  Laterality: N/A;   TEE WITHOUT CARDIOVERSION N/A 10/19/2021   Procedure: TRANSESOPHAGEAL ECHOCARDIOGRAM (TEE);  Surgeon: Kate Sable, MD;  Location: [facility] ORS;  Service: Cardiovascular;  Laterality: N/A;    OB History   No obstetric history on file.      Home Medications    Prior to Admission medications   Medication Sig Start Date End Date Taking? Authorizing Provider  benzonatate (TESSALON) 200 MG capsule Take 1 capsule (200 mg total) by mouth 3 (three) times daily as needed for cough. 10/24/22  Yes Danton Clap, PA-C  molnupiravir EUA (LAGEVRIO) 200 MG CAPS capsule Take 4 capsules (800 mg total) by mouth 2 (two) times daily for 5 days. 10/24/22 10/29/22 Yes Danton Clap, PA-C  acetaminophen (TYLENOL) 650 MG CR tablet Take 650-1,300 mg by mouth every 8 (eight) hours as needed for pain.     [provider]  amiodarone (PACERONE) 200 MG tablet TAKE 1 TABLET(200 MG) BY MOUTH DAILY 08/14/22   Minna Merritts, MD  amLODipine (NORVASC) 5 MG tablet TAKE 1 TABLET(5 MG) BY MOUTH DAILY 04/17/22   Minna Merritts, MD  apixaban (ELIQUIS) 2.5 MG TABS tablet Take 1 tablet (2.5 mg total) by mouth 2 (two) times daily. 07/05/22   Minna Merritts, MD  busPIRone (BUSPAR) 5 MG tablet TAKE 1 TABLET(5 MG) BY MOUTH TWICE DAILY 08/09/22   Steele Sizer, MD  cholecalciferol (VITAMIN D) 1000 UNITS tablet Take 2,000 Units by mouth daily.    [provider]  Cranberry 500 MG TABS Take 2 tablets by mouth daily.    [provider]  doxazosin (CARDURA) 1 MG tablet TAKE 1 TABLET(1 MG) BY MOUTH EVERY EVENING 04/10/22   Minna Merritts, MD  furosemide (LASIX) 20 MG tablet Take 1 tablet (20 mg total) by mouth daily. 08/16/22   Minna Merritts, MD  levothyroxine (SYNTHROID) 137 MCG tablet Take 1 tablet (137 mcg total) by mouth daily before breakfast. 07/11/22   Steele Sizer, MD   loratadine (CLARITIN) 10 MG tablet Take 10 mg by mouth daily as needed for allergies or rhinitis.    [provider]  Melatonin 10 MG TABS Take 10 mg by mouth at bedtime as needed.    [provider]  Multiple Vitamin (MULITIVITAMIN WITH MINERALS) TABS Take 1 tablet by mouth daily.    [provider]  potassium chloride (KLOR-CON) 10 MEQ tablet TAKE 1 TABLET BY MOUTH EVERY DAY AS DIRECTED MAY TAKE AN EXTRA TABLET AFTER LUNCH WITH FLUID TABLET AS NEEDED SWELLING 04/10/22   Gollan, Kathlene November, MD  rosuvastatin (CRESTOR) 40 MG tablet TAKE 1 TABLET(40 MG) BY MOUTH DAILY 12/28/21   Minna Merritts, MD  temazepam (RESTORIL) 15 MG capsule Take 1 capsule (15 mg total) by mouth at bedtime. 07/11/22   Steele Sizer, MD    Family History Family History  Problem Relation Age of Onset  Alzheimer's disease Mother    Stroke Father    Breast cancer Father    Heart disease Father        Pacemaker   Breast cancer Paternal Aunt    Kidney cancer Maternal Grandmother    Alzheimer's disease Maternal Grandfather    Thyroid disease Paternal Grandmother    Stroke Paternal Grandfather     Social History Social History   Tobacco Use   Smoking status: Never   Smokeless tobacco: Never   Tobacco comments:    smoking cessation materials not required  Vaping Use   Vaping Use: Never used  Substance Use Topics   Alcohol use: No   Drug use: No     Allergies   Chocolate, Penicillins, Codeine, Erythromycin, and Septra [sulfamethoxazole-trimethoprim]   Review of Systems Review of Systems  Constitutional:  Positive for chills and fatigue. Negative for diaphoresis and fever.  HENT:  Positive for congestion, rhinorrhea and sore throat. Negative for ear pain, sinus pressure and sinus pain.   Respiratory:  Positive for cough and shortness of breath.   Cardiovascular:  Negative for chest pain.  Gastrointestinal:  Negative for abdominal pain, nausea and vomiting.  Musculoskeletal:   Positive for arthralgias and myalgias.  Skin:  Negative for rash.  Neurological:  Negative for weakness and headaches.  Hematological:  Negative for adenopathy.     Physical Exam Triage Vital Signs ED Triage Vitals [10/24/22 1047]  Enc Vitals Group     BP (!) 152/79     Pulse Rate 84     Resp 16     Temp 97.6 F (36.4 C)     Temp Source Oral     SpO2 100 %     Weight      Height      Head Circumference      Peak Flow      Pain Score      Pain Loc      Pain Edu?      Excl. in [place]?    No data found.  Updated Vital Signs BP (!) 152/79 (BP Location: Left Arm)   Pulse 84   Temp 97.6 F (36.4 C) (Oral)   Resp 16   SpO2 100%       Physical Exam Vitals and nursing note reviewed.  Constitutional:      General: She is not in acute distress.    Appearance: Normal appearance. She is not ill-appearing or toxic-appearing.  HENT:     Head: Normocephalic and atraumatic.     Nose: Congestion present.     Mouth/Throat:     Mouth: Mucous membranes are moist.     Pharynx: Oropharynx is clear. Posterior oropharyngeal erythema present.  Eyes:     General: No scleral icterus.       Right eye: No discharge.        Left eye: No discharge.     Conjunctiva/sclera: Conjunctivae normal.  Cardiovascular:     Rate and Rhythm: Normal rate and regular rhythm.     Heart sounds: Normal heart sounds.  Pulmonary:     Effort: Pulmonary effort is normal. No respiratory distress.     Breath sounds: Normal breath sounds.  Musculoskeletal:     Cervical back: Neck supple.  Skin:    General: Skin is dry.  Neurological:     General: No focal deficit present.     Mental Status: She is alert. Mental status is at baseline.     Motor: No weakness.  Gait: Gait normal.  Psychiatric:        Mood and Affect: Mood normal.        Behavior: Behavior normal.        Thought Content: Thought content normal.      UC Treatments / Results  Labs (all labs ordered are listed, but only abnormal  results are displayed) Labs Reviewed  RESP PANEL BY RT-PCR (RSV, FLU A&B, COVID)  [id number] - Abnormal; Notable for the following components:      Result Value   SARS Coronavirus 2 by RT PCR POSITIVE (*)    All other components within normal limits  GROUP A STREP BY PCR    EKG   Radiology No results found.  Procedures Procedures (including critical care time)  Medications Ordered in UC Medications - No data to display  Initial Impression / Assessment and Plan / UC Course  I have reviewed the triage vital signs and the nursing notes.  Pertinent labs & imaging results that were available during my care of the patient were reviewed by me and considered in my medical decision making (see chart for details).   82 year old female presents for 2-day history of fatigue, chills, cough, congestion and sore throat.  Reports her husband had similar symptoms and a fever last week.  She takes Tylenol regularly and has not noted a fever.  She is afebrile and overall well-appearing.  No acute distress.  On exam she has mild nasal congestion and mild posterior pharyngeal erythema.  Chest clear auscultation heart regular rate and rhythm  Strep test and respiratory panel obtained.  Positive COVID.  Discussed results with patient.  Reviewed current CDC guidelines, isolation protocol and ED precautions.  Patient has multiple medications that interact with Paxlovid so I prescribed molnupiravir as well as Tessalon Perles.  We reviewed the importance of supportive care.  I also advised her to return as needed.   Final Clinical Impressions(s) / UC Diagnoses   Final diagnoses:  COVID-19  Acute cough  Sore throat     Discharge Instructions      -Your COVID test is positive.  You need to isolate 5 days from symptom onset and wear mask for 5 days. - I sent antiviral medication to pharmacy so start this right away.  Also sent cough medication as needed.  You can also take Coricidin HBP and make sure  you are increasing rest and fluids and continue Tylenol as needed. - If you have any uncontrolled fever, weakness or shortness of breath you need to be seen again right away.     ED Prescriptions     Medication Sig Dispense Auth. Provider   molnupiravir EUA (LAGEVRIO) 200 MG CAPS capsule Take 4 capsules (800 mg total) by mouth 2 (two) times daily for 5 days. 40 capsule Laurene Footman B, PA-C   benzonatate (TESSALON) 200 MG capsule Take 1 capsule (200 mg total) by mouth 3 (three) times daily as needed for cough. 20 capsule Danton Clap, PA-C      PDMP not reviewed this encounter.   Danton Clap, PA-C 10/24/22 1152

## 2022-10-31 ENCOUNTER — Other Ambulatory Visit: Payer: Self-pay | Admitting: Cardiovascular Disease

## 2022-11-15 ENCOUNTER — Encounter: Payer: Self-pay | Admitting: Emergency Medicine

## 2022-11-15 ENCOUNTER — Ambulatory Visit
Admission: EM | Admit: 2022-11-15 | Discharge: 2022-11-15 | Disposition: A | Discharge status: Home / Self Care | Payer: Medicare Other | Attending: Family Medicine | Admitting: Family Medicine

## 2022-11-15 DIAGNOSIS — R300 Dysuria: Secondary | ICD-10-CM | POA: Diagnosis not present

## 2022-11-15 DIAGNOSIS — N3000 Acute cystitis without hematuria: Secondary | ICD-10-CM | POA: Insufficient documentation

## 2022-11-15 LAB — URINALYSIS, ROUTINE W REFLEX MICROSCOPIC
Bilirubin Urine: NEGATIVE
Glucose, UA: NEGATIVE mg/dL
Ketones, ur: NEGATIVE mg/dL
Nitrite: NEGATIVE
Protein, ur: NEGATIVE mg/dL
Specific Gravity, Urine: <1.005 — ABNORMAL LOW (ref 1.005–1.030)
pH: 5.5 (ref 5.0–8.0)

## 2022-11-15 LAB — URINALYSIS, MICROSCOPIC (REFLEX)

## 2022-11-15 MED ORDER — NITROFURANTOIN MONOHYD MACRO 100 MG PO CAPS: 100.0000 mg | ORAL_CAPSULE | Freq: Two times a day (BID) | ORAL | 0 refills | Status: DC

## 2022-11-15 NOTE — ED Triage Notes (Signed)
Pt c/o dysuria, urinary frequently, lower back pain. Started yesterday. Denies fever.

## 2022-11-15 NOTE — Discharge Instructions (Addendum)
UTI: Based on either symptoms or urinalysis, you may have a urinary tract infection. We will send the urine for culture and call with results in a few days. Begin antibiotics at this time. Your symptoms should be much improved over the next 2-3 days. Increase rest and fluid intake. If for some reason symptoms are worsening or not improving after a couple of days or the urine culture determines the antibiotics you are taking will not treat the infection, the antibiotics may be changed. Return or go to ER for fever, back pain, worsening urinary pain, discharge, increased blood in urine. May take Tylenol or Motrin OTC for pain relief or consider AZO if no contraindications  ?

## 2022-11-15 NOTE — ED Provider Notes (Signed)
[facility]    CSN: [id number] Arrival date & time: 11/15/22  2263      History   Chief Complaint Chief Complaint  Patient presents with   Urinary Frequency   Dysuria    HPI [name] is a 82 y.o. female presenting for dysuria, urinary frequency and urgency since yesterday.  Patient reports a burning sensation when urinating.  She says her low back hurts but she does have chronic back pain.  She believes that the little worse than normal.  History of back surgery.  Patient reports bladder pressure but no significant abdominal pain and she has not had any fevers or sweats.  Reports that she is always chilled.  She has been increasing her fluid intake.  Concerned about potential urinary tract infection.  No other complaints.  HPI  Past Medical History:  Diagnosis Date   Anxiety    Bronchitis    Chronic combined systolic (congestive) and diastolic (congestive) heart failure ([place])    a. 02/2018 Echo: EF 45-50%, diff HK, triv AI, mod MR, mildly dil LA/RA, nl RV fxn, mod TR. PASP 40-68mHg.   Chronic kidney disease    DJD (degenerative joint disease), cervical    History of stress test    a. 01/2008 MV: EF 76%. Fair ex tol. No ischemia.   Hyperlipidemia    Hypertension    Hypothyroid    LBBB (left bundle branch block)    Leg pain    a. 01/2018 ABI's wnl.   Lumbar spondylosis    Persistent atrial fibrillation ([facility]    a. 01/2018 Event monitor: 45 runs of SVT, longest 14.5 sec. SVT felt to be Afib/flutter-->2% burden. Longest run of AF 4h 25mCHA2DS2VASc = 5-->Xarelto); b. 03/2019 s/p DCCV; c. 04/2019 Repeat Amio load and DCCV.   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    Patient Active Problem List   Diagnosis Date Noted   Basal cell carcinoma, face 01/12/2022   Senile purpura (HCSheldahl03/23/2023   Chronic a-fib (HCMid[place]03/23/2023   Orthostatic hypotension 07/24/2021   Bradycardia 07/24/2021   History of pyelonephritis     History of pneumonia 10/2020   Hydronephrosis of right kidney 03/29/2020   Depression 03/29/2020   Chronic combined systolic (congestive) and diastolic (congestive) heart failure (HCPassaic08/11/2017   AF (paroxysmal atrial fibrillation) (HCMon[place]08/11/2017   CKD (chronic kidney disease), stage III (HCGuide Rock07/26/2019   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   Pulmonary hypertension (HCBellows Falls06/14/2019   Paroxysmal sinus tachycardia 01/29/2018   Leg pain 01/04/2018   Carotid artery calcification, bilateral 12/19/2017   Cervical radiculitis 12/19/2017   Hyperglycemia 03/06/2016   Hypothyroidism in adult 04/21/2015   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   Anxiety 04/21/2015   Hyperlipidemia 04/21/2015   Diuretic-induced hypokalemia 04/21/2015   SVT (supraventricular tachycardia) 05/27/2012   Hypertension 05/27/2012    Past Surgical History:  Procedure Laterality Date   BACK SURGERY  07/2019   CARDIOVERSION N/A 03/26/2018   Procedure: CARDIOVERSION;  Surgeon: EnNelva BushMD;  Location: [facility] ORS;  Service: Cardiovascular;  Laterality: N/A;   CARDIOVERSION N/A 04/24/2018   Procedure: CARDIOVERSION;  Surgeon: GoMinna MerrittsMD;  Location: [facility] ORS;  Service: Cardiovascular;  Laterality: N/A;   CATARACT EXTRACTION W/PHACO Right 07/12/2021   Procedure: CATARACT EXTRACTION PHACO AND INTRAOCULAR LENS PLACEMENT (IOC) RIGHT 16.92 01:30.8 ;  Surgeon: PoBirder RobsonMD;  [place] [facility] Op[place];  Laterality:  Right;   ELECTROPHYSIOLOGY STUDY N/A 02/27/2012   Procedure: ELECTROPHYSIOLOGY STUDY;  Surgeon: Evans Lance, MD;  Location: [facility] CATH LAB;  Service: Cardiovascular;  Laterality: N/A;   EPS and ablation for SVT  5/13   slow pathway ablation by Dr Lovena Le   EYE SURGERY     surgery for detatched retina   NECK SURGERY  02/2020   SUPRAVENTRICULAR TACHYCARDIA ABLATION N/A 02/27/2012   Procedure: SUPRAVENTRICULAR TACHYCARDIA ABLATION;  Surgeon:  Evans Lance, MD;  Location[facility] C[facility] LAB;  Service: Cardiovascular;  Laterality: N/A;   TEE WITHOUT CARDIOVERSION N/A 10/19/2021   Procedure: TRANSESOPHAGEAL ECHOCARDIOGRAM (TEE);  Surgeon: Kate Sable, MD;  Location: [facility] ORS;  Service: Cardiovascular;  Laterality: N/A;    OB History   No obstetric history on file.      Home Medications    Prior to Admission medications   Medication Sig Start Date End Date Taking? Authorizing Provider  acetaminophen (TYLENOL) 650 MG CR tablet Take 650-1,300 mg by mouth every 8 (eight) hours as needed for pain.    Yes [provider]  amiodarone (PACERONE) 200 MG tablet TAKE 1 TABLET(200 MG) BY MOUTH DAILY 08/14/22  Yes Gollan, Kathlene November, MD  amLODipine (NORVASC) 5 MG tablet TAKE 1 TABLET(5 MG) BY MOUTH DAILY 04/17/22  Yes Gollan, Kathlene November, MD  apixaban (ELIQUIS) 2.5 MG TABS tablet Take 1 tablet (2.5 mg total) by mouth 2 (two) times daily. 07/05/22  Yes Gollan, Kathlene November, MD  benzonatate (TESSALON) 200 MG capsule Take 1 capsule (200 mg total) by mouth 3 (three) times daily as needed for cough. 10/24/22  Yes Laurene Footman B, PA-C  busPIRone (BUSPAR) 5 MG tablet TAKE 1 TABLET(5 MG) BY MOUTH TWICE DAILY 08/09/22  Yes Sowles, Drue Stager, MD  cholecalciferol (VITAMIN D) 1000 UNITS tablet Take 2,000 Units by mouth daily.   Yes [provider]  Cranberry 500 MG TABS Take 2 tablets by mouth daily.   Yes [provider]  doxazosin (CARDURA) 1 MG tablet TAKE 1 TABLET(1 MG) BY MOUTH EVERY EVENING 10/31/22  Yes Gollan, Kathlene November, MD  furosemide (LASIX) 20 MG tablet Take 1 tablet (20 mg total) by mouth daily. 08/16/22  Yes Minna Merritts, MD  levothyroxine (SYNTHROID) 137 MCG tablet Take 1 tablet (137 mcg total) by mouth daily before breakfast. 07/11/22  Yes Sowles, Drue Stager, MD  loratadine (CLARITIN) 10 MG tablet Take 10 mg by mouth daily as needed for allergies or rhinitis.   Yes [provider]  Melatonin 10 MG TABS Take 10 mg  by mouth at bedtime as needed.   Yes [provider]  Multiple Vitamin (MULITIVITAMIN WITH MINERALS) TABS Take 1 tablet by mouth daily.   Yes [provider]  nitrofurantoin, macrocrystal-monohydrate, (MACROBID) 100 MG capsule Take 1 capsule (100 mg total) by mouth 2 (two) times daily. 11/15/22  Yes Laurene Footman B, PA-C  potassium chloride (KLOR-CON) 10 MEQ tablet TAKE 1 TABLET BY MOUTH EVERY DAY AS DIRECTED MAY TAKE AN EXTRA TABLET AFTER LUNCH WITH FLUID TABLET AS NEEDED SWELLING 04/10/22  Yes Gollan, Kathlene November, MD  rosuvastatin (CRESTOR) 40 MG tablet TAKE 1 TABLET(40 MG) BY MOUTH DAILY 12/28/21  Yes Gollan, Kathlene November, MD  temazepam (RESTORIL) 15 MG capsule Take 1 capsule (15 mg total) by mouth at bedtime. 07/11/22  Yes Steele Sizer, MD    Family History Family History  Problem Relation Age of Onset   Alzheimer's disease Mother    Stroke Father    Breast cancer  Father    Heart disease Father        Pacemaker   Breast cancer Paternal Aunt    Kidney cancer Maternal Grandmother    Alzheimer's disease Maternal Grandfather    Thyroid disease Paternal Grandmother    Stroke Paternal Grandfather     Social History Social History   Tobacco Use   Smoking status: Never   Smokeless tobacco: Never   Tobacco comments:    smoking cessation materials not required  Vaping Use   Vaping Use: Never used  Substance Use Topics   Alcohol use: No   Drug use: No     Allergies   Chocolate, Penicillins, Codeine, Erythromycin, and Septra [sulfamethoxazole-trimethoprim]   Review of Systems Review of Systems  Constitutional:  Negative for diaphoresis, fatigue and fever.  Gastrointestinal:  Negative for abdominal pain, diarrhea, nausea and vomiting.  Genitourinary:  Positive for dysuria, frequency and urgency. Negative for decreased urine volume, flank pain, hematuria, pelvic pain, vaginal bleeding, vaginal discharge and vaginal pain.  Musculoskeletal:  Positive for back pain.   Skin:  Negative for rash.     Physical Exam Triage Vital Signs ED Triage Vitals  Enc Vitals Group     BP      Pulse      Resp      Temp      Temp src      SpO2      Weight      Height      Head Circumference      Peak Flow      Pain Score      Pain Loc      Pain Edu?      Excl. in [place]?    No data found.  Updated Vital Signs BP (!) 178/77 (BP Location: Left Arm)   Pulse 76   Temp 97.7 F (36.5 C) (Oral)   Resp 16   Ht '5\' 6"'$  (1.676 m)   Wt 125 lb (56.7 kg)   SpO2 98%   BMI 20.18 kg/m      Physical Exam Vitals and nursing note reviewed.  Constitutional:      General: She is not in acute distress.    Appearance: Normal appearance. She is not ill-appearing or toxic-appearing.  HENT:     Head: Normocephalic and atraumatic.  Eyes:     General: No scleral icterus.       Right eye: No discharge.        Left eye: No discharge.     Conjunctiva/sclera: Conjunctivae normal.  Cardiovascular:     Rate and Rhythm: Normal rate and regular rhythm.     Heart sounds: Normal heart sounds.  Pulmonary:     Effort: Pulmonary effort is normal. No respiratory distress.     Breath sounds: Normal breath sounds.  Abdominal:     Palpations: Abdomen is soft.     Tenderness: There is abdominal tenderness (suprapubic). There is no right CVA tenderness or left CVA tenderness.  Musculoskeletal:     Cervical back: Neck supple.  Skin:    General: Skin is dry.  Neurological:     General: No focal deficit present.     Mental Status: She is alert. Mental status is at baseline.     Motor: No weakness.     Gait: Gait normal.  Psychiatric:        Mood and Affect: Mood normal.        Behavior: Behavior normal.  Thought Content: Thought content normal.      UC Treatments / Results  Labs (all labs ordered are listed, but only abnormal results are displayed) Labs Reviewed  URINALYSIS, ROUTINE W REFLEX MICROSCOPIC - Abnormal; Notable for the following components:      Result  Value   APPearance HAZY (*)    Specific Gravity, Urine <1.005 (*)    Hgb urine dipstick MODERATE (*)    Leukocytes,Ua SMALL (*)    All other components within normal limits  URINALYSIS, MICROSCOPIC (REFLEX) - Abnormal; Notable for the following components:   Bacteria, UA MANY (*)    All other components within normal limits  URINE CULTURE    EKG   Radiology No results found.  Procedures Procedures (including critical care time)  Medications Ordered in UC Medications - No data to display  Initial Impression / Assessment and Plan / UC Course  I have reviewed the triage vital signs and the nursing notes.  Pertinent labs & imaging results that were available during my care of the patient were reviewed by me and considered in my medical decision making (see chart for details).   82 year old female presents for dysuria, frequency and urgency as well as lower back pain since yesterday.  Also reports bladder pressure.  No fever.  She is afebrile and overall well-appearing.  No acute distress.  On exam she has mild suprapubic tenderness palpation without guarding or rebound.  No CVA tenderness.  Urinalysis obtained today.  UA shows moderate hemoglobin, leukocytes and many bacteria.  White blood cell clumps seen as well.  Will send urine for culture and treat for urinary tract infection with Macrobid since she has significant allergy to beta-lactams and Bactrim DS.  Reviewed return precautions.   Final Clinical Impressions(s) / UC Diagnoses   Final diagnoses:  Acute cystitis without hematuria  Dysuria     Discharge Instructions      UTI: Based on either symptoms or urinalysis, you may have a urinary tract infection. We will send the urine for culture and call with results in a few days. Begin antibiotics at this time. Your symptoms should be much improved over the next 2-3 days. Increase rest and fluid intake. If for some reason symptoms are worsening or not improving after a  couple of days or the urine culture determines the antibiotics you are taking will not treat the infection, the antibiotics may be changed. Return or go to ER for fever, back pain, worsening urinary pain, discharge, increased blood in urine. May take Tylenol or Motrin OTC for pain relief or consider AZO if no contraindications      ED Prescriptions     Medication Sig Dispense Auth. Provider   nitrofurantoin, macrocrystal-monohydrate, (MACROBID) 100 MG capsule Take 1 capsule (100 mg total) by mouth 2 (two) times daily. 10 capsule Danton Clap, PA-C      PDMP not reviewed this encounter.   Danton Clap, PA-C 11/15/22 1355

## 2022-11-17 LAB — URINE CULTURE: Culture: >=100000 — AB

## 2022-11-20 DIAGNOSIS — Z872 Personal history of diseases of the skin and subcutaneous tissue: Secondary | ICD-10-CM | POA: Diagnosis not present

## 2022-11-20 DIAGNOSIS — L570 Actinic keratosis: Secondary | ICD-10-CM | POA: Diagnosis not present

## 2022-11-20 DIAGNOSIS — D492 Neoplasm of unspecified behavior of bone, soft tissue, and skin: Secondary | ICD-10-CM | POA: Diagnosis not present

## 2022-11-20 DIAGNOSIS — D485 Neoplasm of uncertain behavior of skin: Secondary | ICD-10-CM | POA: Diagnosis not present

## 2022-11-20 DIAGNOSIS — L578 Other skin changes due to chronic exposure to nonionizing radiation: Secondary | ICD-10-CM | POA: Diagnosis not present

## 2022-11-20 DIAGNOSIS — D2270 Melanocytic nevi of unspecified lower limb, including hip: Secondary | ICD-10-CM | POA: Diagnosis not present

## 2022-11-20 DIAGNOSIS — Z85828 Personal history of other malignant neoplasm of skin: Secondary | ICD-10-CM | POA: Diagnosis not present

## 2022-11-20 DIAGNOSIS — C44711 Basal cell carcinoma of skin of unspecified lower limb, including hip: Secondary | ICD-10-CM | POA: Diagnosis not present

## 2022-12-14 DIAGNOSIS — M47816 Spondylosis without myelopathy or radiculopathy, lumbar region: Secondary | ICD-10-CM | POA: Diagnosis not present

## 2022-12-21 DIAGNOSIS — C4491 Basal cell carcinoma of skin, unspecified: Secondary | ICD-10-CM | POA: Diagnosis not present

## 2022-12-21 DIAGNOSIS — C44719 Basal cell carcinoma of skin of left lower limb, including hip: Secondary | ICD-10-CM | POA: Diagnosis not present

## 2022-12-26 ENCOUNTER — Other Ambulatory Visit: Payer: Self-pay | Admitting: Cardiovascular Disease

## 2022-12-26 DIAGNOSIS — E785 Hyperlipidemia, unspecified: Secondary | ICD-10-CM

## 2022-12-28 ENCOUNTER — Encounter: Payer: Self-pay | Admitting: Emergency Medicine

## 2022-12-28 ENCOUNTER — Ambulatory Visit
Admission: EM | Admit: 2022-12-28 | Discharge: 2022-12-28 | Disposition: A | Discharge status: Home / Self Care | Payer: Medicare Other | Attending: Emergency Medicine | Admitting: Emergency Medicine

## 2022-12-28 DIAGNOSIS — N3001 Acute cystitis with hematuria: Secondary | ICD-10-CM | POA: Insufficient documentation

## 2022-12-28 LAB — URINALYSIS, W/ REFLEX TO CULTURE (INFECTION SUSPECTED)
Glucose, UA: NEGATIVE mg/dL
Nitrite: NEGATIVE
Protein, ur: >300 mg/dL — AB
RBC / HPF: >50 RBC/hpf (ref 0–5)
Specific Gravity, Urine: 1.025 (ref 1.005–1.030)
Squamous Epithelial / HPF: NONE SEEN /HPF (ref 0–5)
WBC, UA: >50 WBC/hpf (ref 0–5)
pH: 5 (ref 5.0–8.0)

## 2022-12-28 MED ORDER — NITROFURANTOIN MONOHYD MACRO 100 MG PO CAPS: 100.0000 mg | ORAL_CAPSULE | Freq: Two times a day (BID) | ORAL | 0 refills | Status: DC

## 2022-12-28 NOTE — Discharge Instructions (Addendum)
Your urinalysis shows [name] blood cells and bacteria which are indicative of infection, your urine will be sent to the lab to determine exactly which bacteria is present, if any changes need to be made to your medications you will be notified  Begin use of macrobid every morning and every evening for 5 days  You may use over-the-counter Azo to help minimize your symptoms until antibiotic removes bacteria, this medication will turn your urine orange  Increase your fluid intake through use of water  As always practice good hygiene, wiping front to back and avoidance of scented vaginal products to prevent further irritation  If symptoms continue to persist after use of medication or recur please follow-up with urgent care or your primary doctor as needed

## 2022-12-28 NOTE — ED Triage Notes (Signed)
Pt c/o dysuria, urinary frequency and hematuria since yesterday.

## 2022-12-28 NOTE — ED Provider Notes (Signed)
[facility]    CSN: [id number] Arrival date & time: 12/28/22  0800      History   Chief Complaint Chief Complaint  Patient presents with   Dysuria   Urinary Frequency    HPI [name] is a 82 y.o. female.   Patient presents for evaluation of urinary frequency, dysuria, hematuria and lower abdominal pain present for 1 day.  Has not attempted treatment.  Denies flank pain, fevers, vaginal symptoms.  Past Medical History:  Diagnosis Date   Anxiety    Bronchitis    Chronic combined systolic (congestive) and diastolic (congestive) heart failure ([place])    a. 02/2018 Echo: EF 45-50%, diff HK, triv AI, mod MR, mildly dil LA/RA, nl RV fxn, mod TR. PASP 40-39mHg.   Chronic kidney disease    DJD (degenerative joint disease), cervical    History of stress test    a. 01/2008 MV: EF 76%. Fair ex tol. No ischemia.   Hyperlipidemia    Hypertension    Hypothyroid    LBBB (left bundle branch block)    Leg pain    a. 01/2018 ABI's wnl.   Lumbar spondylosis    Persistent atrial fibrillation ([place]    a. 01/2018 Event monitor: 45 runs of SVT, longest 14.5 sec. SVT felt to be Afib/flutter-->2% burden. Longest run of AF 4h 271mCHA2DS2VASc = 5-->Xarelto); b. 03/2019 s/p DCCV; c. 04/2019 Repeat Amio load and DCCV.   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    Patient Active Problem List   Diagnosis Date Noted   Basal cell carcinoma, face 01/12/2022   Senile purpura (HCFort Bel[place]03/23/2023   Chronic a-fib (HCSummerfield03/23/2023   Orthostatic hypotension 07/24/2021   Bradycardia 07/24/2021   History of pyelonephritis    History of pneumonia 10/2020   Hydronephrosis of right kidney 03/29/2020   Depression 03/29/2020   Chronic combined systolic (congestive) and diastolic (congestive) heart failure (HCForsyth08/11/2017   AF (paroxysmal atrial fibrillation) (HCHerminie08/11/2017   CKD (chronic kidney disease), stage III (HCLongport07/26/2019   Mitral valve  insufficiency 05/07/2018   Pulmonary hypertension (HCHalaula06/14/2019   Paroxysmal sinus tachycardia 01/29/2018   Leg pain 01/04/2018   Carotid artery calcification, bilateral 12/19/2017   Cervical radiculitis 12/19/2017   Hyperglycemia 03/06/2016   Hypothyroidism in adult 04/21/2015   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   Anxiety 04/21/2015   Hyperlipidemia 04/21/2015   Diuretic-induced hypokalemia 04/21/2015   SVT (supraventricular tachycardia) 05/27/2012   Hypertension 05/27/2012    Past Surgical History:  Procedure Laterality Date   BACK SURGERY  07/2019   CARDIOVERSION N/A 03/26/2018   Procedure: CARDIOVERSION;  Surgeon: EnNelva BushMD;  Location: [facility] ORS;  Service: Cardiovascular;  Laterality: N/A;   CARDIOVERSION N/A 04/24/2018   Procedure: CARDIOVERSION;  Surgeon: GoMinna MerrittsMD;  Location: [facility] ORS;  Service: Cardiovascular;  Laterality: N/A;   CATARACT EXTRACTION W/PHACO Right 07/12/2021   Procedure: CATARACT EXTRACTION PHACO AND INTRAOCULAR LENS PLACEMENT (IOC) RIGHT 16.92 01:30.8 ;  Surgeon: PoBirder RobsonMD;  [place] Service: Ophthalmology;  Laterality: Right;   ELECTROPHYSIOLOGY STUDY N/A 02/27/2012   Procedure: ELECTROPHYSIOLOGY STUDY;  Surgeon: GrEvans LanceMD;  [facility];  Service: Cardiovascular;  Laterality: N/A;   EPS and ablation for SVT  5/13   slow pathway ablation by Dr TaLovena Le EYE SURGERY     surgery for detatched retina   NECK  SURGERY  02/2020   SUPRAVENTRICULAR TACHYCARDIA ABLATION N/A 02/27/2012   Procedure: SUPRAVENTRICULAR TACHYCARDIA ABLATION;  Surgeon: Evans Lance, MD;  Location: [facility] CATH LAB;  Service: Cardiovascular;  Laterality: N/A;   TEE WITHOUT CARDIOVERSION N/A 10/19/2021   Procedure: TRANSESOPHAGEAL ECHOCARDIOGRAM (TEE);  Surgeon: Kate Sable, MD;  Location: [facility] ORS;  Service: Cardiovascular;  Laterality: N/A;    OB History   No obstetric history on file.       Home Medications    Prior to Admission medications   Medication Sig Start Date End Date Taking? Authorizing Provider  acetaminophen (TYLENOL) 650 MG CR tablet Take 650-1,300 mg by mouth every 8 (eight) hours as needed for pain.     [provider]  amiodarone (PACERONE) 200 MG tablet TAKE 1 TABLET(200 MG) BY MOUTH DAILY 08/14/22   Minna Merritts, MD  amLODipine (NORVASC) 5 MG tablet TAKE 1 TABLET(5 MG) BY MOUTH DAILY 12/26/22   Minna Merritts, MD  apixaban (ELIQUIS) 2.5 MG TABS tablet Take 1 tablet (2.5 mg total) by mouth 2 (two) times daily. 07/05/22   Minna Merritts, MD  benzonatate (TESSALON) 200 MG capsule Take 1 capsule (200 mg total) by mouth 3 (three) times daily as needed for cough. 10/24/22   Laurene Footman B, PA-C  busPIRone (BUSPAR) 5 MG tablet TAKE 1 TABLET(5 MG) BY MOUTH TWICE DAILY 08/09/22   Steele Sizer, MD  cholecalciferol (VITAMIN D) 1000 UNITS tablet Take 2,000 Units by mouth daily.    [provider]  Cranberry 500 MG TABS Take 2 tablets by mouth daily.    [provider]  doxazosin (CARDURA) 1 MG tablet TAKE 1 TABLET(1 MG) BY MOUTH EVERY EVENING 10/31/22   Minna Merritts, MD  furosemide (LASIX) 20 MG tablet Take 1 tablet (20 mg total) by mouth daily. 08/16/22   Minna Merritts, MD  levothyroxine (SYNTHROID) 137 MCG tablet Take 1 tablet (137 mcg total) by mouth daily before breakfast. 07/11/22   Steele Sizer, MD  loratadine (CLARITIN) 10 MG tablet Take 10 mg by mouth daily as needed for allergies or rhinitis.    [provider]  Melatonin 10 MG TABS Take 10 mg by mouth at bedtime as needed.    [provider]  Multiple Vitamin (MULITIVITAMIN WITH MINERALS) TABS Take 1 tablet by mouth daily.    [provider]  nitrofurantoin, macrocrystal-monohydrate, (MACROBID) 100 MG capsule Take 1 capsule (100 mg total) by mouth 2 (two) times daily. 11/15/22   Laurene Footman B, PA-C  potassium chloride (KLOR-CON) 10 MEQ  tablet TAKE 1 TABLET BY MOUTH EVERY DAY AS DIRECTED MAY TAKE AN EXTRA TABLET AFTER LUNCH WITH FLUID TABLET AS NEEDED SWELLING 04/10/22   Gollan, Kathlene November, MD  rosuvastatin (CRESTOR) 40 MG tablet TAKE 1 TABLET(40 MG) BY MOUTH DAILY 12/26/22   Minna Merritts, MD  temazepam (RESTORIL) 15 MG capsule Take 1 capsule (15 mg total) by mouth at bedtime. 07/11/22   Steele Sizer, MD    Family History Family History  Problem Relation Age of Onset   Alzheimer's disease Mother    Stroke Father    Breast cancer Father    Heart disease Father        Pacemaker   Breast cancer Paternal Aunt    Kidney cancer Maternal Grandmother    Alzheimer's disease Maternal Grandfather    Thyroid disease Paternal Grandmother    Stroke Paternal Grandfather     Social History Social History   Tobacco Use  Smoking status: Never   Smokeless tobacco: Never   Tobacco comments:    smoking cessation materials not required  Vaping Use   Vaping Use: Never used  Substance Use Topics   Alcohol use: No   Drug use: No     Allergies   Chocolate, Penicillins, Codeine, Erythromycin, and Septra [sulfamethoxazole-trimethoprim]   Review of Systems Review of Systems  Constitutional: Negative.   Respiratory: Negative.    Cardiovascular: Negative.   Gastrointestinal: Negative.   Genitourinary:  Positive for dysuria, frequency and pelvic pain. Negative for decreased urine volume, difficulty urinating, dyspareunia, enuresis, flank pain, genital sores, hematuria, menstrual problem, urgency, vaginal bleeding, vaginal discharge and vaginal pain.     Physical Exam Triage Vital Signs ED Triage Vitals  Enc Vitals Group     BP 12/28/22 0813 (!) 183/65     Pulse Rate 12/28/22 0813 76     Resp 12/28/22 0813 16     Temp 12/28/22 0813 97.8 F (36.6 C)     Temp Source 12/28/22 0813 Oral     SpO2 12/28/22 0813 98 %     Weight --      Height --      Head Circumference --      Peak Flow --      Pain Score 12/28/22 0812  0     Pain Loc --      Pain Edu? --      Excl. in [place]? --    No data found.  Updated Vital Signs BP (!) 183/65 (BP Location: Left Arm)   Pulse 76   Temp 97.8 F (36.6 C) (Oral)   Resp 16   SpO2 98%   Visual Acuity Right Eye Distance:   Left Eye Distance:   Bilateral Distance:    Right Eye Near:   Left Eye Near:    Bilateral Near:     Physical Exam Constitutional:      Appearance: Normal appearance.  Eyes:     Extraocular Movements: Extraocular movements intact.  Pulmonary:     Effort: Pulmonary effort is normal.  Abdominal:     General: Abdomen is flat. Bowel sounds are normal.     Palpations: Abdomen is soft.     Tenderness: There is abdominal tenderness in the suprapubic area.  Neurological:     Mental Status: She is alert and oriented to person, place, and time. Mental status is at baseline.      UC Treatments / Results  Labs (all labs ordered are listed, but only abnormal results are displayed) Labs Reviewed  URINALYSIS, W/ REFLEX TO CULTURE (INFECTION SUSPECTED) - Abnormal; Notable for the following components:      Result Value   Color, Urine AMBER (*)    APPearance CLOUDY (*)    Hgb urine dipstick LARGE (*)    Bilirubin Urine SMALL (*)    Ketones, ur TRACE (*)    Protein, ur >300 (*)    Leukocytes,Ua LARGE (*)    Bacteria, UA MANY (*)    All other components within normal limits  URINE CULTURE    EKG   Radiology No results found.  Procedures Procedures (including critical care time)  Medications Ordered in UC Medications - No data to display  Initial Impression / Assessment and Plan / UC Course  I have reviewed the triage vital signs and the nursing notes.  Pertinent labs & imaging results that were available during my care of the patient were reviewed by me and considered in my  medical decision making (see chart for details).  Acute cystitis with hematuria  Urinalysis showing leukocytes and bacteria, sent for culture, on chart  review typically shows E. coli, Macrobid sent to pharmacy, has had success with this medication in the past, recommended supportive measures of increase fluid intake and good hygiene measures, may follow-up with her urologist if symptoms continue to persist or worsen Final Clinical Impressions(s) / UC Diagnoses   Final diagnoses:  Acute cystitis with hematuria     Discharge Instructions      Your urinalysis shows Delyle Weider blood cells and bacteria which are indicative of infection, your urine will be sent to the lab to determine exactly which bacteria is present, if any changes need to be made to your medications you will be notified  Begin use of keflex every morning and every evening for 5 days  You may use over-the-counter Azo to help minimize your symptoms until antibiotic removes bacteria, this medication will turn your urine orange  Increase your fluid intake through use of water  As always practice good hygiene, wiping front to back and avoidance of scented vaginal products to prevent further irritation  If symptoms continue to persist after use of medication or recur please follow-up with urgent care or your primary doctor as needed    ED Prescriptions   None    PDMP not reviewed this encounter.   Hans Eden, [place] 12/28/22 [phone]

## 2022-12-29 DIAGNOSIS — L0390 Cellulitis, unspecified: Secondary | ICD-10-CM | POA: Diagnosis not present

## 2022-12-30 LAB — URINE CULTURE: Culture: 40000 — AB

## 2023-01-01 ENCOUNTER — Encounter: Payer: Self-pay | Admitting: Medical

## 2023-01-01 ENCOUNTER — Other Ambulatory Visit: Payer: Self-pay | Admitting: Family Medicine

## 2023-01-01 ENCOUNTER — Ambulatory Visit: Payer: Medicare Other | Attending: Medical | Admitting: Medical

## 2023-01-01 VITALS — BP 160/70 | HR 70 | Ht 66.0 in | Wt 124.2 lb

## 2023-01-01 DIAGNOSIS — E782 Mixed hyperlipidemia: Secondary | ICD-10-CM | POA: Diagnosis not present

## 2023-01-01 DIAGNOSIS — I480 Paroxysmal atrial fibrillation: Secondary | ICD-10-CM | POA: Diagnosis not present

## 2023-01-01 DIAGNOSIS — I5032 Chronic diastolic (congestive) heart failure: Secondary | ICD-10-CM | POA: Diagnosis not present

## 2023-01-01 DIAGNOSIS — I10 Essential (primary) hypertension: Secondary | ICD-10-CM | POA: Diagnosis not present

## 2023-01-01 DIAGNOSIS — E039 Hypothyroidism, unspecified: Secondary | ICD-10-CM

## 2023-01-01 DIAGNOSIS — I447 Left bundle-branch block, unspecified: Secondary | ICD-10-CM

## 2023-01-01 DIAGNOSIS — I340 Nonrheumatic mitral (valve) insufficiency: Secondary | ICD-10-CM

## 2023-01-01 MED ORDER — APIXABAN 2.5 MG PO TABS: 2.5000 mg | ORAL_TABLET | Freq: Two times a day (BID) | ORAL | 3 refills | Status: DC

## 2023-01-01 NOTE — Patient Instructions (Signed)
Medication Instructions:   Your physician recommends that you continue on your current medications as directed. Please refer to the Current Medication list given to you today.  *If you need a refill on your cardiac medications before your next appointment, please call your pharmacy*   Lab Work:  NONE  If you have labs (blood work) drawn today and your tests are completely normal, you will receive your results only by: [place] (if you have MyChart) OR A paper copy in the mail If you have any lab test that is abnormal or we need to change your treatment, we will call you to review the results.   Testing/Procedures:  NONE   Follow-Up: At [facility], you and your health needs are our priority.  As part of our continuing mission to provide you with exceptional heart care, we have created designated Provider Care Teams.  These Care Teams include your primary Cardiologist (physician) and Advanced Practice Providers (APPs -  Physician Assistants and Nurse Practitioners) who all work together to provide you with the care you need, when you need it.  We recommend signing up for the patient portal called "MyChart".  Sign up information is provided on this After Visit Summary.  MyChart is used to connect with patients for Virtual Visits (Telemedicine).  Patients are able to view lab/test results, encounter notes, upcoming appointments, etc.  Non-urgent messages can be sent to your provider as well.   To learn more about what you can do with MyChart, go to [email or web address].    Your next appointment:   12 month(s)  Provider:   Ida Rogue, MD

## 2023-01-01 NOTE — Progress Notes (Signed)
Cardiology Office Note:    Date:  01/01/2023   ID:  ATZIN SYLVIS, DOB [birth year], MRN [id number]  PCP:  Steele Sizer, MD  [facility] Cardiologist:  Ida Rogue, MD  [place] Electrophysiologist:  None   Referring MD: Steele Sizer, MD   Chief Complaint: 1 year follow-up  History of Present Illness:    [name] is a 82 y.o. female with a hx of AVNRT status post catheter ablation, PAF status post multiple DCCV's as outlined below, chronic combined systolic and diastolic CHF, orthostasis, LBBB, HTN, HLD, degenerative joint disease, leg pain with normal ABIs in 01/2018, and mild carotid artery disease who presents for 1 year follow-up.   Zio patch in 01/2018 showed a predominant rhythm of sinus with an average rate of 66 bpm (range 30 to 179 bpm), bundle branch block/IVCD was present, 45 runs of SVT were noted with the fastest interval lasting 13 beats and the longest interval lasting 14.5 seconds, some episodes of SVT may have been a short bursts of A. fib/flutter, A. fib occurred with a 2% burden with the longest episode lasting 4 hours and 24 minutes, second-degree AV block type I was present.   In 11/2017 and she was found to have frequent PVCs and fusion beats with subsequent event monitoring showing runs of SVT as well as runs of A. fib.  She was placed on Xarelto.  She was seen in clinic in 02/2018 and noted to be in A. fib with RVR despite beta-blocker and calcium channel blocker therapy.  In this setting, she was placed on amiodarone with plans for outpatient DCCV.  However, she was admitted with a CHF exacerbation in the context of A. fib with RVR in 03/2018.  She was cardioverted at that time, though had recurrent A. fib and heart failure in 04/2018 requiring repeat amiodarone and DCCV.     She was admitted to the hospital in 10/2020 with acute respiratory failure and hypoxia in the context of multifocal pneumonia treated with antibiotics, steroids, and nebulizers.   Echo at that time showed an EF of 60 to 65%, no regional wall motion abnormalities, grade 1 diastolic dysfunction, normal RV systolic function and ventricular cavity size, moderately elevated PASP estimated at 54.8 mmHg, and mild to moderate mitral valve regurgitation with 2 jets noted.   She was admitted to the hospital in 07/2021 with near syncope and possible orthostatic hypotension.  She had been in severe pain that related to what was felt to be herpes zoster.  Cardiac enzymes were negative.  BNP 145.  Carotid artery ultrasound showed no hemodynamically significant stenosis bilaterally.  Echo showed an EF of 55 to 60%, no regional wall motion abnormalities, moderate LVH, grade 1 diastolic dysfunction, normal RV systolic function and ventricular cavity size, normal PASP, mild mitral valve regurgitation, mild to moderate tricuspid regurgitation, mild aortic valve insufficiency, mild to moderate aortic valve sclerosis without evidence of stenosis, and an estimated right atrial pressure of 3 mmHg.  She was evaluated by EP with symptoms felt to be related to known orthostatic intolerance and exacerbated by pain from her presumed zoster infection.  It was felt bradycardia was not contributing to her presentation.  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  There was no indication for pacing.  With regards to her symptoms and known LBBB, given this was not abrupt onset and offset this was felt to be unlikely contributing with recommendation for loop recorder if symptoms recur.  EP recommended discontinuation of metoprolol.  At time of discharge, primary service held amiodarone.   She was seen in follow-up on 08/03/2021 and was doing well from a cardiac perspective.  She has not had any further presyncopal episodes.  Her discomfort from shingles persisted, though was improving.  She felt like she was improving.  Amiodarone was restarted in an effort to maintain sinus rhythm.  It  was recommended that she continue to hold metoprolol given prior bradycardia and first-degree AV block.  She was seen in the office on 10/03/2021 and was doing well from a cardiac perspective. No further near syncopal episodes. Blood pressure ranged from the [id number] to Q000111Q systolic with an occasional reading in the [id number] systolic. She did note some lightheadedness if her blood pressure dropped to around [id number] systolic. To further evaluate her mitral regurgitation, she underwent TEE on 10/19/2021 which demonstrated an EF of 55 to 60%, normal RV systolic function and ventricular cavity size, mildly dilated left atrium without evidence of left atrial or left atrial appendage thrombus, mild mitral regurgitation, and trivial aortic insufficiency.   Last seen 12/2021 and was doing well from a cardiac perspective.  Today, the patient is overall doing well from a cardiac perspective.  She has had 2 UTIs this year and had COVID the first week January.  He is also had back injections.  Patient remains fairly active doing household chores and walking 30 minutes daily.  She denies any chest pain, shortness of breath, lower leg edema, lightheadedness, dizziness, orthopnea, or PND.  She is requesting a refill of Eliquis.  She denies any bleeding issues.  She is in normal sinus rhythm on EKG.  Patient had blood work in September 2023.  Past Medical History:  Diagnosis Date   Anxiety    Bronchitis    Chronic combined systolic (congestive) and diastolic (congestive) heart failure ([place])    a. 02/2018 Echo: EF 45-50%, diff HK, triv AI, mod MR, mildly dil LA/RA, nl RV fxn, mod TR. PASP 40-74mHg.   Chronic kidney disease    DJD (degenerative joint disease), cervical    History of stress test    a. 01/2008 MV: EF 76%. Fair ex tol. No ischemia.   Hyperlipidemia    Hypertension    Hypothyroid    LBBB (left bundle branch block)    Leg pain    a. 01/2018 ABI's wnl.   Lumbar spondylosis    Persistent atrial fibrillation [place]     a. 01/2018 Event monitor: 45 runs of SVT, longest 14.5 sec. SVT felt to be Afib/flutter-->2% burden. Longest run of AF 4h 265mCHA2DS2VASc = 5-->Xarelto); b. 03/2019 s/p DCCV; c. 04/2019 Repeat Amio load and DCCV.   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    Past Surgical History:  Procedure Laterality Date   BACK SURGERY  07/2019   CARDIOVERSION N/A 03/26/2018   Procedure: CARDIOVERSION;  Surgeon: EnNelva BushMD;  Location: [facility] ORS;  Service: Cardiovascular;  Laterality: N/A;   CARDIOVERSION N/A 04/24/2018   Procedure: CARDIOVERSION;  Surgeon: GoMinna MerrittsMD;  Location: [facility] ORS;  Service: Cardiovascular;  Laterality: N/A;   CATARACT EXTRACTION W/PHACO Right 07/12/2021   Procedure: CATARACT EXTRACTION PHACO AND INTRAOCULAR LENS PLACEMENT (IOC) RIGHT 16.92 01:30.8 ;  Surgeon: PoBirder RobsonMD;  [place] Service: Ophthal[place];  Laterality: Right;   ELECTROPHYSIOLOGY STUDY N/A 02/27/2012   Procedure: ELECTROPHYSIOLOGY STUDY;  Surgeon: GrEvans LanceMD;  Location:  [facility] CATH LAB;  Service: Cardiovascular;  Laterality: N/A;   EPS and ablation for SVT  5/13   slow pathway ablation by Dr Lovena Le   EYE SURGERY     surgery for detatched retina   NECK SURGERY  02/2020   SUPRAVENTRICULAR TACHYCARDIA ABLATION N/A 02/27/2012   Procedure: SUPRAVENTRICULAR TACHYCARDIA ABLATION;  Surgeon: Evans Lance, MD;  [facility] LAB;  Service: Cardiovascular;  Laterality: N/A;   TEE WITHOUT CARDIOVERSION N/A 10/19/2021   Procedure: TRANSESOPHAGEAL ECHOCARDIOGRAM (TEE);  Surgeon: Kate Sable, MD;  Location: [facility] ORS;  Service: Cardiovascular;  Laterality: N/A;    Current Medications: Current Meds  Medication Sig   acetaminophen (TYLENOL) 650 MG CR tablet Take 650-1,300 mg by mouth every 8 (eight) hours as needed for pain.    amiodarone (PACERONE) 200 MG tablet TAKE 1 TABLET(200 MG) BY MOUTH DAILY   amLODipine (NORVASC) 5 MG  tablet TAKE 1 TABLET(5 MG) BY MOUTH DAILY   busPIRone (BUSPAR) 5 MG tablet TAKE 1 TABLET(5 MG) BY MOUTH TWICE DAILY   cholecalciferol (VITAMIN D) 1000 UNITS tablet Take 2,000 Units by mouth daily.   Cranberry 500 MG TABS Take 2 tablets by mouth daily.   doxazosin (CARDURA) 1 MG tablet TAKE 1 TABLET(1 MG) BY MOUTH EVERY EVENING   furosemide (LASIX) 20 MG tablet Take 1 tablet (20 mg total) by mouth daily.   levothyroxine (SYNTHROID) 137 MCG tablet Take 1 tablet (137 mcg total) by mouth daily before breakfast.   loratadine (CLARITIN) 10 MG tablet Take 10 mg by mouth daily as needed for allergies or rhinitis.   Melatonin 10 MG TABS Take 10 mg by mouth at bedtime as needed.   Multiple Vitamin (MULITIVITAMIN WITH MINERALS) TABS Take 1 tablet by mouth daily.   nitrofurantoin, macrocrystal-monohydrate, (MACROBID) 100 MG capsule Take 1 capsule (100 mg total) by mouth 2 (two) times daily.   potassium chloride (KLOR-CON) 10 MEQ tablet TAKE 1 TABLET BY MOUTH EVERY DAY AS DIRECTED MAY TAKE AN EXTRA TABLET AFTER LUNCH WITH FLUID TABLET AS NEEDED SWELLING   rosuvastatin (CRESTOR) 40 MG tablet TAKE 1 TABLET(40 MG) BY MOUTH DAILY   temazepam (RESTORIL) 15 MG capsule Take 1 capsule (15 mg total) by mouth at bedtime.   [DISCONTINUED] apixaban (ELIQUIS) 2.5 MG TABS tablet Take 1 tablet (2.5 mg total) by mouth 2 (two) times daily.     Allergies:   Chocolate, Penicillins, Codeine, Erythromycin, and Septra [sulfamethoxazole-trimethoprim]   Social History   Socioeconomic History   Marital status: Widowed    Spouse name: Not on file   Number of children: 1   Years of education: Not on file   Highest education level: 12th grade  Occupational History   Occupation: Retired  Tobacco Use   Smoking status: Never   Smokeless tobacco: Never   Tobacco comments:    smoking cessation materials not required  Vaping Use   Vaping Use: Never used  Substance and Sexual Activity   Alcohol use: No   Drug use: No    Sexual activity: Not Currently    Partners: Male    Birth control/protection: Post-menopausal  Other Topics Concern   Not on file  Social History Narrative   Lives in [place], she has a boyfriend   She has cats, some inside, some outside    Social Determinants of Health   Financial Resource Strain: Low Risk  (05/30/2022)   Overall Financial Resource Strain (CARDIA)    Difficulty of Paying Living Expenses: Not hard at all  Food Insecurity: No Food Insecurity (05/30/2022)   Hunger Vital Sign    Worried About Running Out of Food in the Last Year: Never true    Ran Out of Food in the Last Year: Never true  Transportation Needs: No Transportation Needs (05/30/2022)   PRAPARE - Hydrologist (Medical): No    Lack of Transportation (Non-Medical): No  Physical Activity: Sufficiently Active (05/30/2022)   Exercise Vital Sign    Days of Exercise per Week: 6 days    Minutes of Exercise per Session: 30 min  Stress: No Stress Concern Present (05/30/2022)   [place]    Feeling of Stress : Only a little  Social Connections: Moderately Isolated (05/30/2022)   Social Connection and Isolation Panel [NHANES]    Frequency of Communication with Friends and Family: Three times a week    Frequency of Social Gatherings with Friends and Family: Three times a week    Attends Religious Services: 1 to 4 times per year    Active Member of Clubs or Organizations: No    Attends Archivist Meetings: Never    Marital Status: Widowed     Family History: The patient's family history includes Alzheimer's disease in her maternal grandfather and mother; Breast cancer in her father and paternal aunt; Heart disease in her father; Kidney cancer in her maternal grandmother; Stroke in her father and paternal grandfather; Thyroid disease in her paternal grandmother.  ROS:   Please see the history of present illness.      All other systems reviewed and are negative.  EKGs/Labs/Other Studies Reviewed:    The following studies were reviewed today:  Echo TEE 09/2021 1. Left ventricular ejection fraction, by estimation, is 55 to 60%. The  left ventricle has normal function.   2. Right ventricular systolic function is normal. The right ventricular  size is normal.   3. Left atrial size was mildly dilated. No left atrial/left atrial  appendage thrombus was detected.   4. The mitral valve is normal in structure. Mild mitral valve  regurgitation.   5. The aortic valve is tricuspid. Aortic valve regurgitation is trivial.   Echo 07/2021 1. Left ventricular ejection fraction, by estimation, is 55 to 60%. The  left ventricle has normal function. The left ventricle has no regional  wall motion abnormalities. There is moderate left ventricular hypertrophy.  Left ventricular diastolic  parameters are consistent with Grade I diastolic dysfunction (impaired  relaxation).   2. Right ventricular systolic function is normal. The right ventricular  size is normal. There is normal pulmonary artery systolic pressure.   3. The mitral valve is abnormal. Mild mitral valve regurgitation. No  evidence of mitral stenosis.   4. Tricuspid valve regurgitation is mild to moderate.   5. The aortic valve is tricuspid. There is mild calcification of the  aortic valve. There is mild thickening of the aortic valve. Aortic valve  regurgitation is mild. Mild to moderate aortic valve  sclerosis/calcification is present, without any evidence  of aortic stenosis.   6. The inferior vena cava is normal in size with greater than 50%  respiratory variability, suggesting right atrial pressure of 3 mmHg.    EKG:  EKG is ordered today.  The ekg ordered today demonstrates normal sinus rhythm, 71 bpm, LAD, left bundle branch block, first-degree AV block no changes  Recent Labs: 07/11/2022: ALT 18; BUN 14; Creat 0.81; Hemoglobin 13.8; Platelets  240; Potassium  4.8; Sodium 142; TSH 0.60  Recent Lipid Panel    Component Value Date/Time   CHOL 147 07/11/2022 0926   CHOL 164 06/06/2016 0907   TRIG 90 07/11/2022 0926   HDL 76 07/11/2022 0926   HDL 54 06/06/2016 0907   CHOLHDL 1.9 07/11/2022 0926   VLDL 32 (H) 03/12/2017 0815   LDLCALC 54 07/11/2022 0926    Physical Exam:    VS:  BP (!) 160/70 (BP Location: Left Arm, Patient Position: Sitting, Cuff Size: Normal)   Pulse 70   Ht '5\' 6"'$  (1.676 m)   Wt 124 lb 3.2 oz (56.3 kg)   SpO2 98%   BMI 20.05 kg/m     Wt Readings from Last 3 Encounters:  01/01/23 124 lb 3.2 oz (56.3 kg)  11/15/22 125 lb (56.7 kg)  07/11/22 125 lb (56.7 kg)     GEN:  Well nourished, well developed in no acute distress HEENT: Normal NECK: No JVD; No carotid bruits LYMPHATICS: No lymphadenopathy CARDIAC: RRR, +murmur, no rubs, gallops RESPIRATORY:  Clear to auscultation without rales, wheezing or rhonchi  ABDOMEN: Soft, non-tender, non-distended MUSCULOSKELETAL:  No edema; No deformity  SKIN: Warm and dry NEUROLOGIC:  Alert and oriented x 3 PSYCHIATRIC:  Normal affect   ASSESSMENT:    1. AF (paroxysmal atrial fibrillation) ([place])   2. Mitral valve insufficiency, unspecified etiology   3. Chronic diastolic CHF (congestive heart failure) ([place])   4. Essential hypertension   5. Mixed hyperlipidemia   6. LBBB (left bundle branch block)    PLAN:    In order of problems listed above:  Paroxysmal A-fib Patient is in normal sinus rhythm on EKG.  Not on a beta-blocker due to bradycardia and first-degree AV block.  EKG today shows PRI of 208 MS.  She is taking Eliquis 2.5 mg daily (age, my).  She denies any bleeding issues.  Will send in refills of Eliquis.  Continue amiodarone 200 mg daily.  Mitral regurgitation TEE in 2022 showed normal LVEF and mild MR. Continue to follow with serial echocardiograms.  Chronic combined systolic and diastolic heart failure Patient is euvolemic on exam.  She takes  Lasix 20 mg daily.  Not on beta-blocker due to bradycardia as above.  History of orthostasis precludiong GDMT.  Hypertension Patient reports component of whitecoat syndrome.  At home she reports blood pressures 120s over 60s after medications.  Recheck blood pressure was 152/62.  Continue amlodipine 5 mg daily.  LBBB This is chronic and stable.  Disposition: Follow up in 1 year(s) with MD   Signed, Kimani Bedoya Ninfa Meeker, PA-C  01/01/2023 9:12 AM    [facility]

## 2023-01-03 DIAGNOSIS — M47816 Spondylosis without myelopathy or radiculopathy, lumbar region: Secondary | ICD-10-CM | POA: Diagnosis not present

## 2023-01-08 NOTE — Progress Notes (Unsigned)
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 Last TSH at goal , we will recheck it yearly   Skin Cancer: seen by Dr. Phillip Heal, diagnosed with basal cell carcinoma on left side of nose this year, recently had left lower leg basal cell carcinoma removed recently   Chronic pain syndrome: she had c-spine surgery done by  Dr. Larose Hires April 2021 and was  doing well, symptoms started to bother her again had repeat MRI c-spine and lumbar spine 2022 , is now seeing Dr. Gean Quint [facility]. She has  injections on her neck and back,every few months to control pain, she had two rounds of ablation on neck and back.  Pain is daily 5/10 and constant on her neck, she has intermittent back pain on her lower back - usually triggered by activity. She states her back is off at times - but stable and no falls recently    Carotid calcification: found on x-ray of neck. She also had a vascular study done back in 2011 that showed mild plaque formation on right. She is now on Crestor and  Eliquis.  Dr. Larose Hires thinks balanced disturbance is likely from neck surgery. She went back to Dr. Erven Colla and had ABI that was normal, carotid doppler was due in 2021 She also has a history of pulmonary hypertension - under the care of Dr. Rockey Situ  History of SVT/PAF  but doing well on diuretics prn and amiodarone  Continue current  medications, Eliquis dose recently decreased by Dr. Rockey Situ due to her age and body weight   HTN: bp here is high but at goal at home around 130, she has white coat syndrome on top of HTN. She is only taking Norvasc 5 mg and cardura and bp today is controlled    Afib : she had a cardioversion in June 2019  and another one in July 2019 , she has intermittent palpitation but seldom now.   She is on amiodarone for rhythm control also takes Eliquis. No palpitation of chest pain .    CHF chronic: she is down to one pillow at night. She has mild sob with moderate activity ,  no chest pain, she has intermittent lower extremity edema and is taking extra lasix and potassium at lunch prn as instructed by Dr. Rockey Situ . Off ARB and Beta blockers due to hypotension and bradycardia  Senile Purpura: both arms and legs, on Eliquis. Unchanged   Recurrent UTI: she went of period time without symptoms, however had to go to Urgent Care and had antibiotics , she denies any symptoms at this time but we will recheck urine culture today    Patient Active Problem List   Diagnosis Date Noted   Basal cell carcinoma, face 01/12/2022   Senile purpura (  [place]) 01/12/2022   Chronic a-fib ([place]) 01/12/2022   Orthostatic hypotension 07/24/2021   Bradycardia 07/24/2021   History of pyelonephritis    History of pneumonia 10/2020   Hydronephrosis of right kidney 03/29/2020   Depression 03/29/2020   Chronic combined systolic (congestive) and diastolic (congestive) heart failure ([place]) 05/24/2018   AF (paroxysmal atrial fibrillation) ([place]) 05/24/2018   CKD (chronic kidney disease), stage III ([place]) 05/17/2018   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   Pulmonary hypertension ([place]) 04/05/2018   Paroxysmal sinus tachycardia 01/29/2018   Leg pain 01/04/2018   Carotid artery calcification, bilateral 12/19/2017   Cervical radiculitis 12/19/2017   Hyperglycemia 03/06/2016   Hypothyroidism in adult 04/21/2015   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   Anxiety 04/21/2015   Hyperlipidemia 04/21/2015   Diuretic-induced hypokalemia 04/21/2015   SVT (supraventricular tachycardia) 05/27/2012   Hypertension 05/27/2012    Past Surgical History:  Procedure Laterality Date   BACK SURGERY  07/2019   CARDIOVERSION N/A 03/26/2018   Procedure: CARDIOVERSION;  Surgeon: Nelva Bush, MD;  Location: [facility];  Service: Cardiovascular;  Laterality: N/A;   CARDIOVERSION N/A 04/24/2018   Procedure: CARDIOVERSION;  Surgeon: Minna Merritts, MD;  Location: [facility];  Service: Cardiovascular;  Laterality: N/A;   CATARACT EXTRACTION W/PHACO Right 07/12/2021   Procedure: CATARACT EXTRACTION PHACO AND INTRAOCULAR LENS PLACEMENT (IOC) RIGHT 16.92 01:30.8 ;  Surgeon: Birder Robson, MD;  Location: [place];  Service: Ophthalmology;  Laterality: Right;   ELECTROPHYSIOLOGY STUDY N/A 02/27/2012   Procedure: ELECTROPHYSIOLOGY STUDY;  Surgeon: Evans Lance, MD;  Location[facility] CATH LAB;  Service: Cardiovascular;  Laterality: N/A;   EPS and ablation for SVT  5/13   slow pathway ablation by Dr Lovena Le   EYE SURGERY     surgery for detatched retina   NECK SURGERY  02/2020   SUPRAVENTRICULAR TACHYCARDIA ABLATION N/A 02/27/2012   Procedure: SUPRAVENTRICULAR TACHYCARDIA ABLATION;  Surgeon: Evans Lance, MD;  Location: [facility] CATH LAB;  Service: Cardiovascular;  Laterality: N/A;   TEE WITHOUT CARDIOVERSION N/A 10/19/2021   Procedure: TRANSESOPHAGEAL ECHOCARDIOGRAM (TEE);  Surgeon: Kate Sable, MD;  Location: [facility];  Service: Cardiovascular;  Laterality: N/A;    Family History  Problem Relation Age of Onset   Alzheimer's disease Mother    Stroke Father    Breast cancer Father    Heart disease Father        Pacemaker   Breast cancer Paternal Aunt    Kidney cancer Maternal Grandmother    Alzheimer's disease Maternal Grandfather    Thyroid disease Paternal Grandmother    Stroke Paternal Grandfather      Social History   Tobacco Use   Smoking status: Never   Smokeless tobacco: Never   Tobacco comments:    smoking cessation materials not required  Substance Use Topics   Alcohol use: No     Current Outpatient Medications:    acetaminophen (TYLENOL) 650 MG CR tablet, Take 650-1,300 mg by mouth every 8 (eight) hours as needed for pain. , Disp: , Rfl:    amiodarone (PACERONE) 200 MG tablet, TAKE 1 TABLET(200 MG) BY MOUTH DAILY, Disp: 90 tablet, Rfl: 3   amLODipine (NORVASC) 5 MG tablet, TAKE 1 TABLET(5 MG) BY MOUTH DAILY, Disp: 30 tablet, Rfl: 7   apixaban (ELIQUIS) 2.5 MG TABS tablet, Take 1 tablet (2.5 mg total) by mouth 2 (two) times daily., Disp: 180 tablet, Rfl: 3   busPIRone (BUSPAR) 5 MG tablet, TAKE 1 TABLET(5 MG) BY MOUTH  TWICE DAILY, Disp: 180 tablet, Rfl: 1   cholecalciferol (VITAMIN D) 1000 UNITS tablet, Take 2,000 Units by mouth daily., Disp: , Rfl:    Cranberry 500 MG TABS, Take 2 tablets by mouth daily., Disp: , Rfl:    doxazosin (CARDURA) 1 MG tablet, TAKE 1 TABLET(1 MG) BY MOUTH EVERY EVENING, Disp: 90 tablet, Rfl: 0   furosemide (LASIX) 20 MG tablet, Take 1 tablet (20 mg total) by mouth daily., Disp: 90 tablet, Rfl: 2   levothyroxine (SYNTHROID) 137 MCG tablet, TAKE 1 TABLET(137 MCG) BY MOUTH DAILY BEFORE BREAKFAST, Disp: 90 tablet, Rfl: 0   loratadine (CLARITIN) 10 MG tablet, Take 10 mg by mouth daily as needed for allergies or rhinitis., Disp: , Rfl:    Melatonin 10 MG TABS, Take 10 mg by mouth at bedtime as needed., Disp: , Rfl:    Multiple Vitamin (MULITIVITAMIN WITH MINERALS) TABS, Take 1 tablet by mouth daily., Disp: , Rfl:    potassium chloride (KLOR-CON) 10 MEQ tablet, TAKE 1 TABLET BY MOUTH EVERY DAY AS DIRECTED MAY TAKE AN EXTRA TABLET AFTER LUNCH WITH FLUID TABLET AS NEEDED SWELLING, Disp: 180 tablet, Rfl: 1   rosuvastatin (CRESTOR) 40 MG tablet, TAKE 1 TABLET(40 MG) BY MOUTH DAILY, Disp: 90 tablet, Rfl: 3   temazepam (RESTORIL) 15 MG capsule, Take 1 capsule  (15 mg total) by mouth at bedtime., Disp: 90 capsule, Rfl: 1   nitrofurantoin, macrocrystal-monohydrate, (MACROBID) 100 MG capsule, Take 1 capsule (100 mg total) by mouth 2 (two) times daily. (Patient not taking: Reported on 01/09/2023), Disp: 10 capsule, Rfl: 0  Allergies  Allergen Reactions   Chocolate Swelling   Penicillins Swelling and Rash    Has patient had a PCN reaction causing immediate rash, facial/tongue/throat swelling, SOB or lightheadedness with hypotension: Yes Has patient had a PCN reaction causing severe rash involving mucus membranes or skin necrosis: No Has patient had a PCN reaction that required hospitalization: No Has patient had a PCN reaction occurring within the last 10 years: No If all of the above answers are "NO", then may proceed with Cephalosporin use.    Codeine Nausea And Vomiting   Erythromycin Itching and Swelling   Septra [Sulfamethoxazole-Trimethoprim] Swelling    I personally reviewed active problem list, medication list, allergies, family history, social history, health maintenance with the patient/caregiver today.   ROS  Constitutional: Negative for fever or weight change.  Respiratory: Negative for cough, only seldom has  shortness of breath.   Cardiovascular: Negative for chest pain or palpitations.  Gastrointestinal: Negative for abdominal pain, no bowel changes.  Musculoskeletal: Negative for gait problem or joint swelling.  Skin: Negative for rash.  Neurological: Negative for dizziness or headache.  No other specific complaints in a complete review of systems (except as listed in HPI above).   Objective  Vitals:   01/09/23 0840  BP: 138/72  Pulse: 83  Resp: 16  SpO2: 97%  Weight: 124 lb (56.2 kg)  Height: 5\' 6"  (1.676 m)    Body mass index is 20.01 kg/m.  Physical Exam  Constitutional: Patient appears well-developed and well-nourished.  No distress.  HEENT: head atraumatic, normocephalic, pupils equal and reactive to light,  neck supple Cardiovascular: Normal rate, regular rhythm and normal heart sounds.  No murmur heard. No BLE edema. Pulmonary/Chest: Effort normal and breath sounds normal. No respiratory distress. Abdominal: Soft.  There is no tenderness. Psychiatric: Patient has a normal mood and affect. behavior is normal. Judgment and thought content normal.    PHQ2/9:  01/09/2023    8:40 AM 07/11/2022    8:31 AM 05/30/2022   10:35 AM 01/12/2022    8:14 AM 09/14/2021    8:24 AM  Depression screen PHQ 2/9  Decreased Interest 0 0 0 0 0  Down, Depressed, Hopeless 0 0 0 0 0  PHQ - 2 Score 0 0 0 0 0  Altered sleeping 0 0 0 0 0  Tired, decreased energy 0 0 0 0 0  Change in appetite 0 0 0 0 0  Feeling bad or failure about yourself  0 0 0 0 0  Trouble concentrating 0 0 0 0 0  Moving slowly or fidgety/restless 0 0 0 0 0  Suicidal thoughts 0 0 0 0 0  PHQ-9 Score 0 0 0 0 0  Difficult doing work/chores   Not difficult at all      phq 9 is negative   Fall Risk:    01/09/2023    8:45 AM 07/11/2022    8:30 AM 05/30/2022   10:35 AM 01/12/2022    8:14 AM 09/14/2021    8:24 AM  Fall Risk   Falls in the past year? 0 0 0 0 0  Number falls in past yr: 0 0 0 0 0  Injury with Fall? 0 0 0 0 0  Risk for fall due to : Impaired balance/gait No Fall Risks  No Fall Risks No Fall Risks  Follow up Falls prevention discussed Falls prevention discussed Falls evaluation completed Falls prevention discussed Falls prevention discussed      Functional Status Survey: Is the patient deaf or have difficulty hearing?: No Does the patient have difficulty seeing, even when wearing glasses/contacts?: No Does the patient have difficulty concentrating, remembering, or making decisions?: No Does the patient have difficulty walking or climbing stairs?: No Does the patient have difficulty dressing or bathing?: No Does the patient have difficulty doing errands alone such as visiting a doctor's office or shopping?:  No    Assessment & Plan  1. Stage 3a chronic kidney disease ([place])  Recheck it in 6 months   2. AF (paroxysmal atrial fibrillation) ([facility])  Rate controlled  3. Chronic combined systolic (congestive) and diastolic (congestive) heart failure ([facility])  Up to date with cardiologist  4. Pulmonary hypertension ([place])  Keep follow up with cardiologist   5. Senile purpura ([place])  Reassurance given   6. Hypothyroidism in adult  - levothyroxine (SYNTHROID) 137 MCG tablet; TAKE 1 TABLET(137 MCG) BY MOUTH DAILY BEFORE BREAKFAST  Dispense: 90 tablet; Refill: 0  7. Insomnia due to anxiety and fear  - temazepam (RESTORIL) 15 MG capsule; Take 1 capsule (15 mg total) by mouth at bedtime.  Dispense: 90 capsule; Refill: 1  8. GAD (generalized anxiety disorder)  - busPIRone (BUSPAR) 5 MG tablet; Take 1 tablet (5 mg total) by mouth 2 (two) times daily.  Dispense: 180 tablet; Refill: 1  9. E. coli UTI  - CULTURE, URINE COMPREHENSIVE  10. Chronic pain syndrome

## 2023-01-09 ENCOUNTER — Encounter: Payer: Self-pay | Admitting: Family Medicine

## 2023-01-09 ENCOUNTER — Ambulatory Visit (INDEPENDENT_AMBULATORY_CARE_PROVIDER_SITE_OTHER): Payer: Medicare Other | Admitting: Family Medicine

## 2023-01-09 VITALS — BP 138/72 | HR 83 | Resp 16 | Ht 66.0 in | Wt 124.0 lb

## 2023-01-09 DIAGNOSIS — D692 Other nonthrombocytopenic purpura: Secondary | ICD-10-CM

## 2023-01-09 DIAGNOSIS — N390 Urinary tract infection, site not specified: Secondary | ICD-10-CM

## 2023-01-09 DIAGNOSIS — F411 Generalized anxiety disorder: Secondary | ICD-10-CM

## 2023-01-09 DIAGNOSIS — F409 Phobic anxiety disorder, unspecified: Secondary | ICD-10-CM

## 2023-01-09 DIAGNOSIS — F5105 Insomnia due to other mental disorder: Secondary | ICD-10-CM

## 2023-01-09 DIAGNOSIS — I5042 Chronic combined systolic (congestive) and diastolic (congestive) heart failure: Secondary | ICD-10-CM | POA: Diagnosis not present

## 2023-01-09 DIAGNOSIS — B9620 Unspecified Escherichia coli [E. coli] as the cause of diseases classified elsewhere: Secondary | ICD-10-CM

## 2023-01-09 DIAGNOSIS — I480 Paroxysmal atrial fibrillation: Secondary | ICD-10-CM

## 2023-01-09 DIAGNOSIS — E039 Hypothyroidism, unspecified: Secondary | ICD-10-CM | POA: Diagnosis not present

## 2023-01-09 DIAGNOSIS — G894 Chronic pain syndrome: Secondary | ICD-10-CM | POA: Diagnosis not present

## 2023-01-09 DIAGNOSIS — I272 Pulmonary hypertension, unspecified: Secondary | ICD-10-CM | POA: Diagnosis not present

## 2023-01-09 DIAGNOSIS — N1831 Chronic kidney disease, stage 3a: Secondary | ICD-10-CM | POA: Diagnosis not present

## 2023-01-09 MED ORDER — TEMAZEPAM 15 MG PO CAPS: 15.0000 mg | ORAL_CAPSULE | Freq: Every day | ORAL | 1 refills | Status: DC

## 2023-01-09 MED ORDER — BUSPIRONE HCL 5 MG PO TABS: 5.0000 mg | ORAL_TABLET | Freq: Two times a day (BID) | ORAL | 1 refills | Status: DC

## 2023-01-09 MED ORDER — LEVOTHYROXINE SODIUM 137 MCG PO TABS: ORAL_TABLET | ORAL | 0 refills | Status: DC

## 2023-01-09 NOTE — Patient Instructions (Signed)
Please get Tdap, 2nd shingrix

## 2023-01-12 ENCOUNTER — Other Ambulatory Visit: Payer: Self-pay | Admitting: Family Medicine

## 2023-01-12 LAB — CULTURE, URINE COMPREHENSIVE
MICRO NUMBER:: 14712604
SPECIMEN QUALITY:: ADEQUATE

## 2023-01-12 MED ORDER — NITROFURANTOIN MONOHYD MACRO 100 MG PO CAPS: 100.0000 mg | ORAL_CAPSULE | Freq: Two times a day (BID) | ORAL | 0 refills | Status: DC

## 2023-01-20 ENCOUNTER — Other Ambulatory Visit: Payer: Self-pay | Admitting: Cardiovascular Disease

## 2023-01-30 ENCOUNTER — Telehealth: Payer: Self-pay | Admitting: Family Medicine

## 2023-01-30 NOTE — Telephone Encounter (Signed)
Contacted [name] to schedule their annual wellness visit. Appointment made for 8/132024.  [facility] Care Guide [facility] AWV TEAM Direct Dial: [phone]

## 2023-02-05 ENCOUNTER — Other Ambulatory Visit: Payer: Self-pay | Admitting: Family Medicine

## 2023-02-05 DIAGNOSIS — F411 Generalized anxiety disorder: Secondary | ICD-10-CM

## 2023-02-14 DIAGNOSIS — H35379 Puckering of macula, unspecified eye: Secondary | ICD-10-CM | POA: Diagnosis not present

## 2023-02-14 DIAGNOSIS — Z961 Presence of intraocular lens: Secondary | ICD-10-CM | POA: Diagnosis not present

## 2023-02-14 DIAGNOSIS — H35373 Puckering of macula, bilateral: Secondary | ICD-10-CM | POA: Diagnosis not present

## 2023-02-14 DIAGNOSIS — H26491 Other secondary cataract, right eye: Secondary | ICD-10-CM | POA: Diagnosis not present

## 2023-02-27 DIAGNOSIS — M47816 Spondylosis without myelopathy or radiculopathy, lumbar region: Secondary | ICD-10-CM | POA: Diagnosis not present

## 2023-02-27 DIAGNOSIS — M47812 Spondylosis without myelopathy or radiculopathy, cervical region: Secondary | ICD-10-CM | POA: Diagnosis not present

## 2023-03-02 ENCOUNTER — Encounter: Payer: Self-pay | Admitting: Family Medicine

## 2023-04-03 ENCOUNTER — Other Ambulatory Visit: Payer: Self-pay | Admitting: Family Medicine

## 2023-04-03 DIAGNOSIS — E039 Hypothyroidism, unspecified: Secondary | ICD-10-CM

## 2023-04-05 ENCOUNTER — Ambulatory Visit
Admission: EM | Admit: 2023-04-05 | Discharge: 2023-04-05 | Disposition: A | Discharge status: Home / Self Care | Payer: Medicare Other | Attending: Physician Assistant | Admitting: Physician Assistant

## 2023-04-05 ENCOUNTER — Ambulatory Visit (INDEPENDENT_AMBULATORY_CARE_PROVIDER_SITE_OTHER): Payer: Medicare Other

## 2023-04-05 DIAGNOSIS — R062 Wheezing: Secondary | ICD-10-CM | POA: Insufficient documentation

## 2023-04-05 DIAGNOSIS — I130 Hypertensive heart and chronic kidney disease with heart failure and stage 1 through stage 4 chronic kidney disease, or unspecified chronic kidney disease: Secondary | ICD-10-CM | POA: Diagnosis not present

## 2023-04-05 DIAGNOSIS — J189 Pneumonia, unspecified organism: Secondary | ICD-10-CM | POA: Diagnosis present

## 2023-04-05 DIAGNOSIS — R52 Pain, unspecified: Secondary | ICD-10-CM | POA: Diagnosis not present

## 2023-04-05 DIAGNOSIS — R5383 Other fatigue: Secondary | ICD-10-CM | POA: Diagnosis not present

## 2023-04-05 DIAGNOSIS — R051 Acute cough: Secondary | ICD-10-CM | POA: Insufficient documentation

## 2023-04-05 DIAGNOSIS — Z1152 Encounter for screening for COVID-19: Secondary | ICD-10-CM | POA: Diagnosis not present

## 2023-04-05 DIAGNOSIS — R059 Cough, unspecified: Secondary | ICD-10-CM | POA: Diagnosis not present

## 2023-04-05 DIAGNOSIS — R0602 Shortness of breath: Secondary | ICD-10-CM | POA: Diagnosis not present

## 2023-04-05 LAB — SARS CORONAVIRUS 2 BY RT PCR: SARS Coronavirus 2 by RT PCR: NEGATIVE

## 2023-04-05 MED ORDER — DOXYCYCLINE HYCLATE 100 MG PO CAPS: 100.0000 mg | ORAL_CAPSULE | Freq: Two times a day (BID) | ORAL | 0 refills | Status: AC

## 2023-04-05 NOTE — ED Provider Notes (Signed)
[facility]    CSN: [id number] Arrival date & time: 04/05/23  8295      History   Chief Complaint Chief Complaint  Patient presents with   Cough   Chest Congestion   Wheezing    HPI Leslie CUPPLES is a 82 y.o. female presenting for 3-day history of fatigue, cough, congestion, and wheezing.  Reports occasional shortness of breath.  She denies any fever but says she takes Tylenol every day for arthritic pain.  She says her husband was sick with the same symptoms. She reports taking over-the-counter cough medication as needed for symptoms.  No known COVID or flu exposure.  Reports receiving vaccines for COVID, flu and RSV.  History of COVID 19 five months ago. States she feels worse now than when she had COVID earlier this year. She has a history of CHF, CKD, hypertension, hyperlipidemia, A-fib and SVT.  No history of lung disease.  HPI  Past Medical History:  Diagnosis Date   Anxiety    Bronchitis    Chronic combined systolic (congestive) and diastolic (congestive) heart failure (HCC)    a. 02/2018 Echo: EF 45-50%, diff HK, triv AI, mod MR, mildly dil LA/RA, nl RV fxn, mod TR. PASP 40-47mmHg.   Chronic kidney disease    DJD (degenerative joint disease), cervical    History of stress test    a. 01/2008 MV: EF 76%. Fair ex tol. No ischemia.   Hyperlipidemia    Hypertension    Hypothyroid    LBBB (left bundle branch block)    Leg pain    a. 01/2018 ABI's wnl.   Lumbar spondylosis    Persistent atrial fibrillation (HCC)    a. 01/2018 Event monitor: 45 runs of SVT, longest 14.5 sec. SVT felt to be Afib/flutter-->2% burden. Longest run of AF 4h 34m (CHA2DS2VASc = 5-->Xarelto); b. 03/2019 s/p DCCV; c. 04/2019 Repeat Amio load and DCCV.   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   Pulmonary hypertension (HCC) 04/05/2018   Paroxysmal sinus tachycardia 01/29/2018   Leg pain 01/04/2018   Carotid artery calcification, bilateral 12/19/2017   Cervical radiculitis 12/19/2017   Hyperglycemia 03/06/2016   Hypothyroidism in adult 04/21/2015   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   Anxiety 04/21/2015   Hyperlipidemia 04/21/2015   Diuretic-induced hypokalemia 04/21/2015   SVT (supraventricular tachycardia) 05/27/2012   Hypertension 05/27/2012    Past Surgical History:  Procedure Laterality Date   BACK SURGERY  07/2019   CARDIOVERSION N/A 03/26/2018   Procedure: CARDIOVERSION;  Surgeon: Yvonne Kendall, MD;  Location: [facility];  Service: Cardiovascular;  Laterality: N/A;   CARDIOVERSION N/A 04/24/2018   Procedure: CARDIOVERSION;  Surgeon: Antonieta Iba, MD;  Location: [facility];  Service: Cardiovascular;  Laterality: N/A;   CATARACT EXTRACTION W/PHACO Right 07/12/2021   Procedure: CATARACT EXTRACTION PHACO AND INTRAOCULAR LENS PLACEMENT (IOC) RIGHT 16.92 01:30.8 ;  Surgeon: Galen Manila, MD;  Location: [facility];  Service: Ophthalmology;  Laterality: Right;   ELECTROPHYSIOLOGY STUDY N/A 02/27/2012   Procedure: ELECTROPHYSIOLOGY STUDY;  Surgeon: Marinus Maw, MD;  Location: [facility];  Service: Cardiovascular;  Laterality: N/A;   EPS and ablation for SVT  5/13   slow pathway ablation by Dr Ladona Ridgel   EYE SURGERY     surgery for detatched retina   NECK SURGERY  02/2020   SUPRAVENTRICULAR TACHYCARDIA ABLATION N/A  02/27/2012   Procedure: SUPRAVENTRICULAR TACHYCARDIA ABLATION;  Surgeon: Marinus Maw, MD;  Location: [facility] CATH LAB;  Service: Cardiovascular;  Laterality: N/A;   TEE WITHOUT CARDIOVERSION N/A 10/19/2021   Procedure: TRANSESOPHAGEAL ECHOCARDIOGRAM (TEE);  Surgeon: Debbe Odea, MD;  Location: [facility];  Service: Cardiovascular;  Laterality: N/A;    OB History   No obstetric history on file.      Home Medications    Prior to Admission medications   Medication Sig Start Date End Date Taking? Authorizing Provider  acetaminophen (TYLENOL) 650 MG CR tablet Take 650-1,300 mg by mouth every 8 (eight) hours as needed for pain.    Yes [provider]  amiodarone (PACERONE) 200 MG tablet TAKE 1 TABLET(200 MG) BY MOUTH DAILY 08/14/22  Yes Gollan, Tollie Pizza, MD  amLODipine (NORVASC) 5 MG tablet TAKE 1 TABLET(5 MG) BY MOUTH DAILY 12/26/22  Yes Gollan, Tollie Pizza, MD  apixaban (ELIQUIS) 2.5 MG TABS tablet Take 1 tablet (2.5 mg total) by mouth 2 (two) times daily. 01/01/23  Yes Furth, Cadence H, PA-C  busPIRone (BUSPAR) 5 MG tablet TAKE 1 TABLET(5 MG) BY MOUTH TWICE DAILY 02/05/23  Yes Sowles, Danna Hefty, MD  cholecalciferol (VITAMIN D) 1000 UNITS tablet Take 2,000 Units by mouth daily.   Yes [provider]  Cranberry 500 MG TABS Take 2 tablets by mouth daily.   Yes [provider]  doxazosin (CARDURA) 1 MG tablet TAKE 1 TABLET(1 MG) BY MOUTH EVERY EVENING 01/22/23  Yes Gollan, Tollie Pizza, MD  doxycycline (VIBRAMYCIN) 100 MG capsule Take 1 capsule (100 mg total) by mouth 2 (two) times daily for 7 days. 04/05/23 04/12/23 Yes Shirlee Latch, PA-C  furosemide (LASIX) 20 MG tablet Take 1 tablet (20 mg total) by mouth daily. 08/16/22  Yes Gollan, Tollie Pizza, MD  levothyroxine (SYNTHROID) 137 MCG tablet TAKE 1 TABLET(137 MCG) BY MOUTH DAILY BEFORE BREAKFAST 04/03/23  Yes Sowles, Danna Hefty, MD  loratadine (CLARITIN) 10 MG tablet Take 10 mg by mouth daily as needed for allergies or rhinitis.    Yes [provider]  Melatonin 10 MG TABS Take 10 mg by mouth at bedtime as needed.   Yes [provider]  Multiple Vitamin (MULITIVITAMIN WITH MINERALS) TABS Take 1 tablet by mouth daily.   Yes [provider]  nitrofurantoin, macrocrystal-monohydrate, (MACROBID) 100 MG capsule Take 1 capsule (100 mg total) by mouth 2 (two) times daily. 01/12/23  Yes Sowles, Danna Hefty, MD  potassium chloride (KLOR-CON) 10 MEQ tablet TAKE 1 TABLET BY MOUTH EVERY DAY AS DIRECTED MAY TAKE AN EXTRA TABLET AFTER LUNCH WITH FLUID TABLET AS NEEDED SWELLING 04/10/22  Yes Gollan, Tollie Pizza, MD  rosuvastatin (CRESTOR) 40 MG tablet TAKE 1 TABLET(40 MG) BY MOUTH DAILY 12/26/22  Yes Gollan, Tollie Pizza, MD  temazepam (RESTORIL) 15 MG capsule Take 1 capsule (15 mg total) by mouth at bedtime. 01/09/23  Yes Alba Cory, MD    Family History Family History  Problem Relation Age of Onset   Alzheimer's disease Mother  Stroke Father    Breast cancer Father    Heart disease Father        Pacemaker   Breast cancer Paternal Aunt    Kidney cancer Maternal Grandmother    Alzheimer's disease Maternal Grandfather    Thyroid disease Paternal Grandmother    Stroke Paternal Grandfather     Social History Social History   Tobacco Use   Smoking status: Never   Smokeless tobacco: Never   Tobacco comments:    smoking cessation materials not required  Vaping Use   Vaping Use: Never used  Substance Use Topics   Alcohol use: No   Drug use: No     Allergies   Chocolate, Penicillins, Codeine, Erythromycin, and Septra [sulfamethoxazole-trimethoprim]   Review of Systems Review of Systems  Constitutional:  Positive for chills and fatigue. Negative for diaphoresis and fever.  HENT:  Positive for congestion and rhinorrhea. Negative for ear pain, sinus pressure, sinus pain and sore throat.   Respiratory:  Positive for cough, shortness of breath and wheezing.   Cardiovascular:  Negative for chest pain.   Gastrointestinal:  Negative for abdominal pain, nausea and vomiting.  Musculoskeletal:  Positive for arthralgias and myalgias.  Skin:  Negative for rash.  Neurological:  Negative for weakness and headaches.  Hematological:  Negative for adenopathy.     Physical Exam Triage Vital Signs ED Triage Vitals [10/24/22 1047]  Enc Vitals Group     BP (!) 152/79     Pulse Rate 84     Resp 16     Temp 97.6 F (36.4 C)     Temp Source Oral     SpO2 100 %     Weight      Height      Head Circumference      Peak Flow      Pain Score      Pain Loc      Pain Edu?      Excl. in GC?    No data found.  Updated Vital Signs BP (!) 160/78 (BP Location: Left Arm)   Pulse 69   Temp 97.8 F (36.6 C) (Oral)   Resp 16   Ht 5\' 6"  (1.676 m)   Wt 122 lb 8 oz (55.6 kg)   SpO2 97%   BMI 19.77 kg/m       Physical Exam Vitals and nursing note reviewed.  Constitutional:      General: She is not in acute distress.    Appearance: Normal appearance. She is not ill-appearing or toxic-appearing.  HENT:     Head: Normocephalic and atraumatic.     Nose: Congestion present.     Mouth/Throat:     Mouth: Mucous membranes are moist.     Pharynx: Oropharynx is clear. No posterior oropharyngeal erythema.  Eyes:     General: No scleral icterus.       Right eye: No discharge.        Left eye: No discharge.     Conjunctiva/sclera: Conjunctivae normal.  Cardiovascular:     Rate and Rhythm: Normal rate and regular rhythm.     Heart sounds: Normal heart sounds.  Pulmonary:     Effort: Pulmonary effort is normal. No respiratory distress.     Breath sounds: No wheezing, rhonchi or rales.  Musculoskeletal:     Cervical back: Neck supple.  Skin:    General: Skin is dry.  Neurological:     General: No focal deficit present.  Mental Status: She is alert. Mental status is at baseline.     Motor: No weakness.     Gait: Gait normal.  Psychiatric:        Mood and Affect: Mood normal.         Behavior: Behavior normal.        Thought Content: Thought content normal.      UC Treatments / Results  Labs (all labs ordered are listed, but only abnormal results are displayed) Labs Reviewed  SARS CORONAVIRUS 2 BY RT PCR    EKG   Radiology DG Chest 2 View  Result Date: 04/05/2023 CLINICAL DATA:  Provided history: Cough. Congestion. Wheezing. Shortness of breath. EXAM: CHEST - 2 VIEW COMPARISON:  Prior chest radiographs 07/23/2021 and earlier. FINDINGS: Cardiomegaly. Aortic atherosclerosis. Small retrocardiac opacity within the right lung. No appreciable airspace consolidation on the left. No evidence of pleural effusion or pneumothorax. No acute osseous abnormality identified. Degenerative changes of the spine. ACDF hardware. IMPRESSION: Small retrocardiac opacity within the right lung which may reflect atelectasis, scarring or pneumonia. Electronically Signed   By: Jackey Loge D.O.   On: 04/05/2023 09:17    Procedures Procedures (including critical care time)  Medications Ordered in UC Medications - No data to display  Initial Impression / Assessment and Plan / UC Course  I have reviewed the triage vital signs and the nursing notes.  Pertinent labs & imaging results that were available during my care of the patient were reviewed by me and considered in my medical decision making (see chart for details).   82 year old female presents for 3-day history of fatigue, chills, cough, congestion and wheezing. She takes Tylenol regularly and has not noted a fever.  She is afebrile and overall well-appearing.  No acute distress.  On exam she has mild nasal congestion.  Chest clear auscultation and heart regular rate and rhythm  PCR COVID test obtained. Negative. CXR obtained.  Small retrocardiac opacity within the right lung.  Radiologist states it could reflect atelectasis, scarring or pneumonia.  Given the patient reports feeling acutely ill and worsen when she had COVID I will  cover her for the possibility of early pneumonia.  Sent doxycycline to pharmacy as she has severe allergy to penicillins.  Encouraged increasing rest and fluids.  Advised her to continue with Coricidin HBP cough medication.  Advise repeat x-ray in 4 weeks.  Reviewed return and ER precautions.  1 acute illness with systemic symptoms.   Final Clinical Impressions(s) / UC Diagnoses   Final diagnoses:  Pneumonia of right lung due to infectious organism, unspecified part of lung  Acute cough  Wheezing     Discharge Instructions      -Possible pneumonia seen on your x-ray.  I sent antibiotics to pharmacy.  Take full course. - Increase rest and fluids and continue with over-the-counter cough medication as needed. - Follow-up with your primary care provider in about 4 weeks for repeat chest x-ray. - If you start to have a fever or worsening symptoms then please go to the ER.       ED Prescriptions     Medication Sig Dispense Auth. Provider   doxycycline (VIBRAMYCIN) 100 MG capsule Take 1 capsule (100 mg total) by mouth 2 (two) times daily for 7 days. 14 capsule Shirlee Latch, PA-C      PDMP not reviewed this encounter.      Shirlee Latch, PA-C 04/05/23 [phone]

## 2023-04-05 NOTE — Discharge Instructions (Signed)
-  Possible pneumonia seen on your x-ray.  I sent antibiotics to pharmacy.  Take full course. - Increase rest and fluids and continue with over-the-counter cough medication as needed. - Follow-up with your primary care provider in about 4 weeks for repeat chest x-ray. - If you start to have a fever or worsening symptoms then please go to the ER.

## 2023-04-05 NOTE — ED Triage Notes (Signed)
Pt c/o cough,wheezing & chest congestion x3 days. Denies any hx of asthma or COPD. Has tried coricidin w/minor relief.

## 2023-04-23 ENCOUNTER — Other Ambulatory Visit: Payer: Self-pay | Admitting: Cardiovascular Disease

## 2023-05-07 ENCOUNTER — Other Ambulatory Visit: Payer: Self-pay | Admitting: Family Medicine

## 2023-05-07 DIAGNOSIS — F411 Generalized anxiety disorder: Secondary | ICD-10-CM

## 2023-06-04 NOTE — Progress Notes (Unsigned)
Patient: [name], Female    DOB: 1940/12/15, 82 y.o.   MRN: [id number]  Visit Date: 06/05/2023  Today's Provider: Ruel Favors, MD   AWV  Subjective:    HPI [name] is a 82 y.o. female who presents today for her Subsequent Annual Wellness Visit.  Patient/Caregiver input:  feels well, having to use a cane when walking far, going to have ablation on her back and neck   Review of Systems  Constitutional: Negative for fever or weight change.  Respiratory: Negative for cough and shortness of breath.   Cardiovascular: Negative for chest pain or palpitations.  Gastrointestinal: Negative for abdominal pain, no bowel changes.  Musculoskeletal: Negative for gait problem or joint swelling. She has neck and back  Skin: Negative for rash.  Neurological: Negative for dizziness or headache.  No other specific complaints in a complete review of systems (except as listed in HPI above).  Past Medical History:  Diagnosis Date   Anxiety    Bronchitis    Chronic combined systolic (congestive) and diastolic (congestive) heart failure (HCC)    a. 02/2018 Echo: EF 45-50%, diff HK, triv AI, mod MR, mildly dil LA/RA, nl RV fxn, mod TR. PASP 40-55mmHg.   Chronic kidney disease    DJD (degenerative joint disease), cervical    History of stress test    a. 01/2008 MV: EF 76%. Fair ex tol. No ischemia.   Hyperlipidemia    Hypertension    Hypothyroid    LBBB (left bundle branch block)    Leg pain    a. 01/2018 ABI's wnl.   Lumbar spondylosis    Persistent atrial fibrillation (HCC)    a. 01/2018 Event monitor: 45 runs of SVT, longest 14.5 sec. SVT felt to be Afib/flutter-->2% burden. Longest run of AF 4h 85m (CHA2DS2VASc = 5-->Xarelto); b. 03/2019 s/p DCCV; c. 04/2019 Repeat Amio load and DCCV.   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    Past Surgical History:  Procedure Laterality Date   BACK SURGERY  07/2019   CARDIOVERSION N/A 03/26/2018    Procedure: CARDIOVERSION;  Surgeon: Yvonne Kendall, MD;  Location: [facility];  Service: Cardiovascular;  Laterality: N/A;   CARDIOVERSION N/A 04/24/2018   Procedure: CARDIOVERSION;  Surgeon: Antonieta Iba, MD;  Location: [facility];  Service: Cardiovascular;  Laterality: N/A;   CATARACT EXTRACTION W/PHACO Right 07/12/2021   Procedure: CATARACT EXTRACTION PHACO AND INTRAOCULAR LENS PLACEMENT (IOC) RIGHT 16.92 01:30.8 ;  Surgeon: Galen Manila, MD;  Location: [facility];  Service: Ophthalmology;  Laterality: Right;   ELECTROPHYSIOLOGY STUDY N/A 02/27/2012   Procedure: ELECTROPHYSIOLOGY STUDY;  Surgeon: Marinus Maw, MD;  Location: [facility] CATH LAB;  Service: Cardiovascular;  Laterality: N/A;   EPS and ablation for SVT  5/13   slow pathway ablation by Dr Ladona Ridgel   EYE SURGERY     surgery for detatched retina   NECK SURGERY  02/2020   SUPRAVENTRICULAR TACHYCARDIA ABLATION N/A 02/27/2012   Procedure: SUPRAVENTRICULAR TACHYCARDIA ABLATION;  Surgeon: Marinus Maw, MD;  Location: [facility] CATH LAB;  Service: Cardiovascular;  Laterality: N/A;   TEE WITHOUT CARDIOVERSION N/A 10/19/2021   Procedure: TRANSESOPHAGEAL ECHOCARDIOGRAM (TEE);  Surgeon: Debbe Odea, MD;  Location: [facility];  Service: Cardiovascular;  Laterality: N/A;    Family History  Problem Relation Age of Onset   Alzheimer's disease Mother    Stroke Father    Breast cancer Father    Heart disease Father  Pacemaker   Breast cancer Paternal Aunt    Kidney cancer Maternal Grandmother    Alzheimer's disease Maternal Grandfather    Thyroid disease Paternal Grandmother    Stroke Paternal Grandfather     Social History   Socioeconomic History   Marital status: Widowed    Spouse name: Not on file   Number of children: 1   Years of education: Not on file   Highest education level: 12th grade  Occupational History   Occupation: Retired  Tobacco Use   Smoking status: Never   Smokeless tobacco: Never   Tobacco  comments:    smoking cessation materials not required  Vaping Use   Vaping status: Never Used  Substance and Sexual Activity   Alcohol use: No   Drug use: No   Sexual activity: Not Currently    Partners: Male    Birth control/protection: Post-menopausal  Other Topics Concern   Not on file  Social History Narrative   Lives in [place], she has a boyfriend   She has cats, some inside, some outside    Social Determinants of Health   Financial Resource Strain: Low Risk  (06/05/2023)   Overall Financial Resource Strain (CARDIA)    Difficulty of Paying Living Expenses: Not hard at all  Food Insecurity: No Food Insecurity (06/05/2023)   Hunger Vital Sign    Worried About Running Out of Food in the Last Year: Never true    Ran Out of Food in the Last Year: Never true  Transportation Needs: No Transportation Needs (06/05/2023)   PRAPARE - Administrator, Civil Service (Medical): No    Lack of Transportation (Non-Medical): No  Physical Activity: Sufficiently Active (06/05/2023)   Exercise Vital Sign    Days of Exercise per Week: 5 days    Minutes of Exercise per Session: 30 min  Stress: No Stress Concern Present (06/05/2023)   [employer] of Occupational Health - Occupational Stress Questionnaire    Feeling of Stress : Not at all  Social Connections: Socially Integrated (06/05/2023)   Social Connection and Isolation Panel [NHANES]    Frequency of Communication with Friends and Family: More than three times a week    Frequency of Social Gatherings with Friends and Family: More than three times a week    Attends Religious Services: More than 4 times per year    Active Member of [employer] or Organizations: Yes    Attends Engineer, structural: More than 4 times per year    Marital Status: Married  Catering manager Violence: Not At Risk (06/05/2023)   Humiliation, Afraid, Rape, and Kick questionnaire    Fear of Current or Ex-Partner: No    Emotionally Abused: No     Physically Abused: No    Sexually Abused: No    Outpatient Encounter Medications as of 06/05/2023  Medication Sig   acetaminophen (TYLENOL) 650 MG CR tablet Take 650-1,300 mg by mouth every 8 (eight) hours as needed for pain.    amiodarone (PACERONE) 200 MG tablet TAKE 1 TABLET(200 MG) BY MOUTH DAILY   amLODipine (NORVASC) 5 MG tablet TAKE 1 TABLET(5 MG) BY MOUTH DAILY   apixaban (ELIQUIS) 2.5 MG TABS tablet Take 1 tablet (2.5 mg total) by mouth 2 (two) times daily.   busPIRone (BUSPAR) 5 MG tablet TAKE 1 TABLET(5 MG) BY MOUTH TWICE DAILY   cholecalciferol (VITAMIN D) 1000 UNITS tablet Take 2,000 Units by mouth daily.   Cranberry 500 MG TABS Take 2 tablets by mouth  daily.   doxazosin (CARDURA) 1 MG tablet TAKE 1 TABLET(1 MG) BY MOUTH EVERY EVENING   furosemide (LASIX) 20 MG tablet TAKE 1 TABLET(20 MG) BY MOUTH DAILY   levothyroxine (SYNTHROID) 137 MCG tablet TAKE 1 TABLET(137 MCG) BY MOUTH DAILY BEFORE BREAKFAST   loratadine (CLARITIN) 10 MG tablet Take 10 mg by mouth daily as needed for allergies or rhinitis.   Melatonin 10 MG TABS Take 10 mg by mouth at bedtime as needed.   Multiple Vitamin (MULITIVITAMIN WITH MINERALS) TABS Take 1 tablet by mouth daily.   nitrofurantoin, macrocrystal-monohydrate, (MACROBID) 100 MG capsule Take 1 capsule (100 mg total) by mouth 2 (two) times daily.   potassium chloride (KLOR-CON) 10 MEQ tablet TAKE 1 TABLET BY MOUTH EVERY DAY AS DIRECTED. MAY TAKE AN EXTRA AFTER LUNCH WITH FLUID PILL AS NEEDED FOR SWELLING   rosuvastatin (CRESTOR) 40 MG tablet TAKE 1 TABLET(40 MG) BY MOUTH DAILY   temazepam (RESTORIL) 15 MG capsule Take 1 capsule (15 mg total) by mouth at bedtime.   No facility-administered encounter medications on file as of 06/05/2023.    Allergies  Allergen Reactions   Chocolate Swelling   Penicillins Swelling and Rash    Has patient had a PCN reaction causing immediate rash, facial/tongue/throat swelling, SOB or lightheadedness with hypotension:  Yes Has patient had a PCN reaction causing severe rash involving mucus membranes or skin necrosis: No Has patient had a PCN reaction that required hospitalization: No Has patient had a PCN reaction occurring within the last 10 years: No If all of the above answers are "NO", then may proceed with Cephalosporin use.    Codeine Nausea And Vomiting   Erythromycin Itching and Swelling   Septra [Sulfamethoxazole-Trimethoprim] Swelling    Care Team Updated in EHR: Yes  Last Vision Exam: April 2024 Wears corrective lenses: No doing well since cataract  Last Dental Exam: 2023 Last Hearing Exam: Unsure Wears Hearing Aids: Yes  Functional Ability / Safety Screening 1.  Was the timed Get Up and Go test shorter than 30 seconds?  yes 2.  Does the patient need help with the phone, transportation, shopping,      preparing meals, housework, laundry, medications, or managing money?  no 3.  Is the patient's home free of loose throw rugs in walkways, pet beds, electrical cords, etc?   yes      Grab bars in the bathroom? yes      Handrails on the stairs?   yes      Adequate lighting?   yes 4.  Has the patient noticed any hearing difficulties?   no  Diet Recall and Exercise Regimen: mostly eats at home , goes out to eat breakfast once a week   Advanced Care Planning: A voluntary discussion about advance care planning including the explanation and discussion of advance directives.  Discussed health care proxy and Living will, and the patient was able to identify a health care proxy as Molly Maduro.  Patient does have a living will at present time. If patient does have living will, I have requested they bring this to the clinic to be scanned in to their chart. Does patient have a HCPOA?    yes If yes, name and contact information: Mendel Ryder - husband and daughter Jiles Crocker  Does patient have a living will or MOST form?  yes  Cancer Screenings: Skin: up to date with dermatologist twice a year  Lung: Low Dose  CT Chest recommended if Age 77-80 years, 30 pack-year currently  smoking OR have quit w/in 15years. Patient does not qualify. Breast: Up to date on Mammogram? No - no longer wants to have it done  Up to date of Bone Density/Dexa? No - last one in 2018  Colon: No longer interested   Additional Screenings: Hepatitis B/HIV/Syphillis: N/A Hepatitis C Screening: 09/13/20 Intimate Partner Violence: Negative  Objective:   Vitals: BP 132/70   Pulse 74   Resp 16   Ht 5\' 6"  (1.676 m)   Wt 125 lb (56.7 kg)   SpO2 95%   BMI 20.18 kg/m  Body mass index is 20.18 kg/m.  No results found.  Physical Exam Constitutional: Patient appears well-developed and well-nourished. No distress.  HEENT: head atraumatic, normocephalic, pupils equal and reactive to light, neck supple Cardiovascular: Normal rate, regular rhythm and normal heart sounds.  No murmur heard. No BLE edema. Pulmonary/Chest: Effort normal and breath sounds normal. No respiratory distress. Abdominal: Soft.  There is no tenderness. Psychiatric: Patient has a normal mood and affect. behavior is normal. Judgment and thought content normal.  Cognitive Testing - 6-CIT  Correct? Score   What year is it? yes 0 Yes = 0    No = 4  What month is it? yes 0 Yes = 0    No = 3  Remember:     Floyde Parkins, [address], [place]     What time is it? yes 0 Yes = 0    No = 3  Count backwards from 20 to 1 yes 0 Correct = 0    1 error = 2   More than 1 error = 4  Say the months of the year in reverse. yes 0 Correct = 0    1 error = 2   More than 1 error = 4  What address did I ask you to remember? yes 0 Correct = 0  1 error = 2    2 error = 4    3 error = 6    4 error = 8    All wrong = 10       TOTAL SCORE  0/28   Interpretation:  Normal  Normal (0-7) Abnormal (8-28)   Fall Risk:    06/05/2023    7:55 AM 01/09/2023    8:45 AM 07/11/2022    8:30 AM 05/30/2022   10:35 AM 01/12/2022    8:14 AM  Fall Risk   Falls in the past year? 0 0 0 0 0   Number falls in past yr: 0 0 0 0 0  Injury with Fall? 0 0 0 0 0  Risk for fall due to : No Fall Risks Impaired balance/gait No Fall Risks  No Fall Risks  Follow up Falls prevention discussed Falls prevention discussed Falls prevention discussed Falls evaluation completed Falls prevention discussed    Depression Screen    06/05/2023    7:56 AM 01/09/2023    8:40 AM 07/11/2022    8:31 AM 05/30/2022   10:35 AM 01/12/2022    8:14 AM  Depression screen PHQ 2/9  Decreased Interest 0 0 0 0 0  Down, Depressed, Hopeless 0 0 0 0 0  PHQ - 2 Score 0 0 0 0 0  Altered sleeping 0 0 0 0 0  Tired, decreased energy 0 0 0 0 0  Change in appetite 0 0 0 0 0  Feeling bad or failure about yourself  0 0 0 0 0  Trouble concentrating 0 0 0 0  0  Moving slowly or fidgety/restless 0 0 0 0 0  Suicidal thoughts 0 0 0 0 0  PHQ-9 Score 0 0 0 0 0  Difficult doing work/chores    Not difficult at all     Recent Results (from the past 2160 hour(s))  SARS Coronavirus 2 by RT PCR (hospital order, performed in [facility] hospital lab) *cepheid single result test* Anterior Nasal Swab     Status: None   Collection Time: 04/05/23  8:26 AM   Specimen: Anterior Nasal Swab  Result Value Ref Range   SARS Coronavirus 2 by RT PCR NEGATIVE NEGATIVE    Comment: (NOTE) SARS-CoV-2 target nucleic acids are NOT DETECTED.  The SARS-CoV-2 RNA is generally detectable in upper and lower respiratory specimens during the acute phase of infection. The lowest concentration of SARS-CoV-2 viral copies this assay can detect is 250 copies / mL. A negative result does not preclude SARS-CoV-2 infection and should not be used as the sole basis for treatment or other patient management decisions.  A negative result may occur with improper specimen collection / handling, submission of specimen other than nasopharyngeal swab, presence of viral mutation(s) within the areas targeted by this assay, and inadequate number of viral copies (<250  copies / mL). A negative result must be combined with clinical observations, patient history, and epidemiological information.  Fact Sheet for Patients:   RoadLapTop.co.za  Fact Sheet for Healthcare Providers: http://kim-miller.com/  This test is not yet approved or  cleared by the [place] FDA and has been authorized for detection and/or diagnosis of SARS-CoV-2 by FDA under an Emergency Use Authorization (EUA).  This EUA will remain in effect (meaning this test can be used) for the duration of the COVID-19 declaration under Section 564(b)(1) of the Act, 21 U.S.C. section 360bbb-3(b)(1), unless the authorization is terminated or revoked sooner.  Performed at [facility], [address], [place], [place] [address]     Assessment & Plan:    1. Encounter for Medicare annual wellness exam     Exercise Activities and Dietary recommendations  Goals      Exercise 150 min/wk Moderate Activity     Recommend to exercise for at least 150 minutes per week.     Increase water intake     Recommend increasing water intake to 5-6 glasses a day.     Track and Manage Fluids and Swelling-Heart Failure     Timeframe:  Long-Range Goal Priority:  High Start Date:  03/10/2021                            Expected End Date: 09/10/2022                       Follow Up within 90 days    - call office if I gain more than 2 pounds in one day or 5 pounds in one week - keep legs up while sitting - track weight in diary - use salt in moderation - watch for swelling in feet, ankles and legs every day    Why is this important?   It is important to check your weight daily and watch how much salt and liquids you have.  It will help you to manage your heart failure.    Notes:        - Discussed health benefits of physical activity, and encouraged her to engage in regular exercise appropriate  for her age and condition.   Immunization  History  Administered Date(s) Administered   Fluad Quad(high Dose 65+) 08/11/2019, 07/11/2022   Influenza, High Dose Seasonal PF 07/22/2015, 07/04/2018   Influenza-Unspecified 08/25/2016, 08/13/2017, 08/13/2020, 08/09/2021   PFIZER(Purple Top)SARS-COV-2 Vaccination 11/13/2019, 12/04/2019, 08/13/2020   Pfizer Covid-19 Vaccine Bivalent Booster 57yrs & up 08/09/2021   Pneumococcal Conjugate-13 09/07/2016   Pneumococcal Polysaccharide-23 12/04/2017    Health Maintenance  Topic Date Due   DTaP/Tdap/Td (1 - Tdap) Never done   COVID-19 Vaccine (5 - 2023-24 season) 06/23/2022   INFLUENZA VACCINE  05/24/2023   Zoster Vaccines- Shingrix (1 of 2) 09/04/2023 (Originally 08/14/1960)   Medicare Annual Wellness (AWV)  06/04/2024   Pneumonia Vaccine 83+ Years old  Completed   DEXA SCAN  Completed   HPV VACCINES  Aged Out   Hepatitis C Screening  Discontinued    No orders of the defined types were placed in this encounter.   Current Outpatient Medications:    acetaminophen (TYLENOL) 650 MG CR tablet, Take 650-1,300 mg by mouth every 8 (eight) hours as needed for pain. , Disp: , Rfl:    amiodarone (PACERONE) 200 MG tablet, TAKE 1 TABLET(200 MG) BY MOUTH DAILY, Disp: 90 tablet, Rfl: 3   amLODipine (NORVASC) 5 MG tablet, TAKE 1 TABLET(5 MG) BY MOUTH DAILY, Disp: 30 tablet, Rfl: 7   apixaban (ELIQUIS) 2.5 MG TABS tablet, Take 1 tablet (2.5 mg total) by mouth 2 (two) times daily., Disp: 180 tablet, Rfl: 3   busPIRone (BUSPAR) 5 MG tablet, TAKE 1 TABLET(5 MG) BY MOUTH TWICE DAILY, Disp: 180 tablet, Rfl: 0   cholecalciferol (VITAMIN D) 1000 UNITS tablet, Take 2,000 Units by mouth daily., Disp: , Rfl:    Cranberry 500 MG TABS, Take 2 tablets by mouth daily., Disp: , Rfl:    doxazosin (CARDURA) 1 MG tablet, TAKE 1 TABLET(1 MG) BY MOUTH EVERY EVENING, Disp: 90 tablet, Rfl: 3   furosemide (LASIX) 20 MG tablet, TAKE 1 TABLET(20 MG) BY MOUTH DAILY, Disp: 90 tablet, Rfl: 2   levothyroxine (SYNTHROID) 137 MCG  tablet, TAKE 1 TABLET(137 MCG) BY MOUTH DAILY BEFORE BREAKFAST, Disp: 90 tablet, Rfl: 0   loratadine (CLARITIN) 10 MG tablet, Take 10 mg by mouth daily as needed for allergies or rhinitis., Disp: , Rfl:    Melatonin 10 MG TABS, Take 10 mg by mouth at bedtime as needed., Disp: , Rfl:    Multiple Vitamin (MULITIVITAMIN WITH MINERALS) TABS, Take 1 tablet by mouth daily., Disp: , Rfl:    nitrofurantoin, macrocrystal-monohydrate, (MACROBID) 100 MG capsule, Take 1 capsule (100 mg total) by mouth 2 (two) times daily., Disp: 10 capsule, Rfl: 0   potassium chloride (KLOR-CON) 10 MEQ tablet, TAKE 1 TABLET BY MOUTH EVERY DAY AS DIRECTED. MAY TAKE AN EXTRA AFTER LUNCH WITH FLUID PILL AS NEEDED FOR SWELLING, Disp: 180 tablet, Rfl: 2   rosuvastatin (CRESTOR) 40 MG tablet, TAKE 1 TABLET(40 MG) BY MOUTH DAILY, Disp: 90 tablet, Rfl: 3   temazepam (RESTORIL) 15 MG capsule, Take 1 capsule (15 mg total) by mouth at bedtime., Disp: 90 capsule, Rfl: 1 There are no discontinued medications.  I have personally reviewed and addressed the Medicare Annual Wellness health risk assessment questionnaire and have noted the following in the patient's chart:  A.         Medical and social history & family history B.         Use of alcohol, tobacco, and illicit drugs  C.  Current medications and supplements D.         Functional and Cognitive ability and status E.         Nutritional status F.         Physical activity G.        Advance directives H.         List of other physicians I.          Hospitalizations, surgeries, and ER visits in previous 12 months J.         Vitals K.         Screenings such as hearing, vision, cognitive function, and depression L.         Referrals and appointments: needs Tdap  In addition, I have reviewed and discussed with patient certain preventive protocols, quality metrics, and best practice recommendations. A written personalized care plan for preventive services as well as general  preventive health recommendations were provided to patient.   See attached scanned questionnaire for additional information.   No follow-ups on file.

## 2023-06-05 ENCOUNTER — Encounter: Payer: Self-pay | Admitting: Family Medicine

## 2023-06-05 ENCOUNTER — Ambulatory Visit (INDEPENDENT_AMBULATORY_CARE_PROVIDER_SITE_OTHER): Payer: Medicare Other | Admitting: Family Medicine

## 2023-06-05 VITALS — BP 132/70 | HR 74 | Resp 16 | Ht 66.0 in | Wt 125.0 lb

## 2023-06-05 DIAGNOSIS — Z0000 Encounter for general adult medical examination without abnormal findings: Secondary | ICD-10-CM | POA: Diagnosis not present

## 2023-06-05 NOTE — Patient Instructions (Signed)
Get Tdap at local pharmacy

## 2023-07-03 DIAGNOSIS — M47816 Spondylosis without myelopathy or radiculopathy, lumbar region: Secondary | ICD-10-CM | POA: Diagnosis not present

## 2023-07-04 ENCOUNTER — Other Ambulatory Visit: Payer: Self-pay | Admitting: Family Medicine

## 2023-07-04 DIAGNOSIS — E039 Hypothyroidism, unspecified: Secondary | ICD-10-CM

## 2023-07-11 NOTE — Progress Notes (Unsigned)
Name: [name]   MRN: [id number]    DOB: [birth year]   Date:07/12/2023       Progress Note  Subjective  Chief Complaint  Follow Up  HPI  GAD: doing well on Buspar , she takes every am and occasionally in the afternoon , she also takes Temazepam most nights to sleep and it helps with anxiety and calms her mind. 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 Stable  Hypothyroidism: she has been on the same dose for a long time 137 mcg daily  compliant, no dysphagia, hair loss  or change in bowel movements. She is due for labs today   Skin Cancer: seen by Dr. Cheree Ditto, she is up to date with visits   Chronic pain syndrome: she had c-spine surgery done by  Dr. Fabiola Backer April 2021 and was  doing well, symptoms started to bother her again had repeat MRI c-spine and lumbar spine 2022 , is now seeing Dr. Belva Bertin [facility]. She has  injections on her neck and back,every few months to control pain, she had two rounds of ablation on neck and back.  Pain is daily 5/10 and constant on her neck, she has intermittent back pain on her lower back - usually triggered by activity. She has to use a cane for balance when walking outside her house. She is having increase in neck pain and is going for an ablation at the end of Sep    Carotid calcification: found on x-ray of neck. She also had a vascular study done back in 2011 that showed mild plaque formation on right. She is now on Crestor and  Eliquis.  Dr. Fabiola Backer thinks balanced disturbance is likely from neck surgery. She went back to Dr. Phineas Semen and had ABI that was normal, carotid doppler was due in 2021 She also has a history of pulmonary hypertension - under the care of Dr. Mariah Milling  History of SVT/AF  but doing well on diuretics prn and amiodarone  Continue current medications, Eliquis 2.5 mg dose  and doing well.   HTN: bp has been controlled at home and today also . She is on Norvasc and Cardura    Afib : she had a cardioversion in June 2019  and another one in July 2019 She is on amiodarone for rhythm control also takes Eliquis. No palpitations lately of chest pain .    CHF chronic/pulmonary hypertension: she is down to one pillow at night. She has mild sob with moderate activity, no chest pain, she has intermittent lower extremity edema and is taking extra lasix and potassium at lunch prn as instructed by Dr. Mariah Milling . Off ARB and Beta blockers due to hypotension and bradycardia , she is compliant with medications and also visits  Senile Purpura: both arms and legs, on Eliquis. 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   Pulmonary hypertension (HCC) 04/05/2018   Paroxysmal sinus tachycardia 01/29/2018   Leg pain 01/04/2018   Carotid artery calcification, bilateral 12/19/2017   Cervical radiculitis 12/19/2017   Hyperglycemia 03/06/2016   Hypothyroidism in adult 04/21/2015   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   Anxiety 04/21/2015   Hyperlipidemia 04/21/2015   Diuretic-induced hypokalemia 04/21/2015   SVT (supraventricular tachycardia) 05/27/2012   Hypertension 05/27/2012    Past Surgical History:  Procedure Laterality Date   BACK SURGERY  07/2019   CARDIOVERSION N/A 03/26/2018   Procedure:  CARDIOVERSION;  Surgeon: Yvonne Kendall, MD;  Location: [facility] ORS;  Service: Cardiovascular;  Laterality: N/A;   CARDIOVERSION N/A 04/24/2018   Procedure: CARDIOVERSION;  Surgeon: Antonieta Iba, MD;  Location: [facility];  Service: Cardiovascular;  Laterality: N/A;   CATARACT EXTRACTION W/PHACO Right 07/12/2021   Procedure: CATARACT EXTRACTION PHACO AND INTRAOCULAR LENS PLACEMENT (IOC) RIGHT 16.92 01:30.8 ;  Surgeon: Galen Manila, MD;  Location: [facility];  Service: Ophthalmology;  Laterality: Right;   ELECTROPHYSIOLOGY STUDY N/A 02/27/2012   Procedure: ELECTROPHYSIOLOGY STUDY;  Surgeon: Marinus Maw, MD;  Location: [facility] C[facility];  Service: Cardiovascular;  Laterality: N/A;   EPS and ablation for SVT  5/13   slow pathway ablation by Dr Ladona Ridgel   EYE SURGERY     surgery for detatched retina   NECK SURGERY  02/2020   SUPRAVENTRICULAR TACHYCARDIA ABLATION N/A 02/27/2012   Procedure: SUPRAVENTRICULAR TACHYCARDIA ABLATION;  Surgeon: Marinus Maw, MD;  Location: [facility] CATH LAB;  Service: Cardiovascular;  Laterality: N/A;   TEE WITHOUT CARDIOVERSION N/A 10/19/2021   Procedure: TRANSESOPHAGEAL ECHOCARDIOGRAM (TEE);  Surgeon: Debbe Odea, MD;  Location: [facility];  Service: Cardiovascular;  Laterality: N/A;    Family History  Problem Relation Age of Onset   Alzheimer's disease Mother    Stroke Father    Breast cancer Father    Heart disease Father        Pacemaker   Kidney cancer Maternal Grandmother    Alzheimer's disease Maternal Grandfather    Thyroid disease Paternal Grandmother    Stroke Paternal Grandfather    Breast cancer Paternal Aunt    Cancer Daughter 41       ovarian cancer    Social History   Tobacco Use   Smoking status: Never   Smokeless tobacco: Never   Tobacco comments:    smoking cessation materials not required  Substance Use Topics   Alcohol use: No     Current Outpatient Medications:    acetaminophen (TYLENOL) 650 MG CR tablet, Take 650-1,300  mg by mouth every 8 (eight) hours as needed for pain. , Disp: , Rfl:    amiodarone (PACERONE) 200 MG tablet, TAKE 1 TABLET(200 MG) BY MOUTH DAILY, Disp: 90 tablet, Rfl: 3   amLODipine (NORVASC) 5 MG tablet, TAKE 1 TABLET(5 MG) BY MOUTH DAILY, Disp: 30 tablet, Rfl: 7   apixaban (ELIQUIS) 2.5 MG TABS tablet, Take 1 tablet (2.5 mg total) by mouth 2 (two) times daily., Disp: 180 tablet, Rfl: 3   cholecalciferol (VITAMIN D) 1000 UNITS tablet, Take 2,000 Units by mouth daily., Disp: , Rfl:    Cranberry 500 MG TABS, Take 2 tablets by mouth daily., Disp: , Rfl:    doxazosin (CARDURA) 1 MG tablet, TAKE 1 TABLET(1 MG) BY MOUTH EVERY EVENING, Disp: 90 tablet, Rfl: 3   furosemide (LASIX) 20 MG tablet, TAKE 1 TABLET(20 MG) BY MOUTH DAILY,  Disp: 90 tablet, Rfl: 2   levothyroxine (SYNTHROID) 137 MCG tablet, TAKE 1 TABLET(137 MCG) BY MOUTH DAILY BEFORE BREAKFAST, Disp: 30 tablet, Rfl: 0   loratadine (CLARITIN) 10 MG tablet, Take 10 mg by mouth daily as needed for allergies or rhinitis., Disp: , Rfl:    Melatonin 10 MG TABS, Take 10 mg by mouth at bedtime as needed., Disp: , Rfl:    Multiple Vitamin (MULITIVITAMIN WITH MINERALS) TABS, Take 1 tablet by mouth daily., Disp: , Rfl:    potassium chloride (KLOR-CON) 10 MEQ tablet, TAKE 1 TABLET BY MOUTH EVERY DAY AS DIRECTED. MAY TAKE AN EXTRA AFTER LUNCH WITH FLUID PILL AS NEEDED FOR SWELLING, Disp: 180 tablet, Rfl: 2   rosuvastatin (CRESTOR) 40 MG tablet, TAKE 1 TABLET(40 MG) BY MOUTH DAILY, Disp: 90 tablet, Rfl: 3   busPIRone (BUSPAR) 5 MG tablet, Take 1 tablet (5 mg total) by mouth 2 (two) times daily., Disp: 180 tablet, Rfl: 1   temazepam (RESTORIL) 15 MG capsule, Take 1 capsule (15 mg total) by mouth at bedtime., Disp: 90 capsule, Rfl: 1  Allergies  Allergen Reactions   Chocolate Swelling   Penicillins Swelling and Rash    Has patient had a PCN reaction causing immediate rash, facial/tongue/throat swelling, SOB or lightheadedness with hypotension: Yes Has  patient had a PCN reaction causing severe rash involving mucus membranes or skin necrosis: No Has patient had a PCN reaction that required hospitalization: No Has patient had a PCN reaction occurring within the last 10 years: No If all of the above answers are "NO", then may proceed with Cephalosporin use.    Codeine Nausea And Vomiting   Erythromycin Itching and Swelling   Septra [Sulfamethoxazole-Trimethoprim] Swelling    I personally reviewed active problem list, medication list, allergies, family history, social history, health maintenance with the patient/caregiver today.   ROS  Ten systems reviewed and is negative except as mentioned in HPI    Objective  Vitals:   07/12/23 0854  BP: 132/72  Pulse: 72  Resp: 14  Temp: 98 F (36.7 C)  TempSrc: Oral  SpO2: 99%  Weight: 127 lb 8 oz (57.8 kg)  Height: 5\' 6"  (1.676 m)    Body mass index is 20.58 kg/m.  Physical Exam  Constitutional: Patient appears well-developed and well-nourished. Obese  No distress.  HEENT: head atraumatic, normocephalic, pupils equal and reactive to light, neck supple Cardiovascular: Normal rate, regular rhythm and normal heart sounds.  No murmur heard. No BLE edema. Pulmonary/Chest: Effort normal and breath sounds normal. No respiratory distress. Abdominal: Soft.  There is no tenderness. Psychiatric: Patient has a normal mood and affect. behavior is normal. Judgment and thought content normal.   PHQ2/9:    07/12/2023    8:57 AM 06/05/2023    7:56 AM 01/09/2023    8:40 AM 07/11/2022    8:31 AM 05/30/2022   10:35 AM  Depression screen PHQ 2/9  Decreased Interest 1 0 0 0 0  Down, Depressed, Hopeless 2 0 0 0 0  PHQ - 2 Score 3 0 0 0 0  Altered sleeping 0 0 0 0 0  Tired, decreased energy 0 0 0 0 0  Change in appetite 0 0 0 0 0  Feeling bad or failure about yourself  0 0 0 0 0  Trouble concentrating 0 0 0 0 0  Moving slowly or fidgety/restless 0 0 0 0 0  Suicidal thoughts 0 0 0 0 0  PHQ-9 Score  3 0 0 0  0  Difficult doing work/chores Somewhat difficult    Not difficult at all    phq 9 is negative   Fall Risk:    07/12/2023    8:57 AM 06/05/2023    7:55 AM 01/09/2023    8:45 AM 07/11/2022    8:30 AM 05/30/2022   10:35 AM  Fall Risk   Falls in the past year? 0 0 0 0 0  Number falls in past yr:  0 0 0 0  Injury with Fall?  0 0 0 0  Risk for fall due to : No Fall Risks No Fall Risks Impaired balance/gait No Fall Risks   Follow up Falls prevention discussed Falls prevention discussed Falls prevention discussed Falls prevention discussed Falls evaluation completed    Assessment & Plan  1. AF (paroxysmal atrial fibrillation) (HCC)  Rate controlled, on pacerone   2. Senile purpura (HCC)  Reassurance given   3. Pulmonary hypertension (HCC)  Under the care of cardiologist   4. Chronic combined systolic (congestive) and diastolic (congestive) heart failure (HCC)  - Lipid panel  5. GAD (generalized anxiety disorder)  - busPIRone (BUSPAR) 5 MG tablet; Take 1 tablet (5 mg total) by mouth 2 (two) times daily.  Dispense: 180 tablet; Refill: 1  6. Need for immunization against influenza  She will get flu shot at local pharmacy   7. Insomnia due to anxiety and fear  - temazepam (RESTORIL) 15 MG capsule; Take 1 capsule (15 mg total) by mouth at bedtime.  Dispense: 90 capsule; Refill: 1  8. Hypothyroidism in adult  - TSH  9. Chronic pain syndrome  She will have an ablation soon  10. Long-term use of high-risk medication  - CBC with Differential/Platelet - COMPLETE METABOLIC PANEL WITH GFR  11. Dyslipidemia  - Lipid panel

## 2023-07-12 ENCOUNTER — Encounter: Payer: Self-pay | Admitting: Family Medicine

## 2023-07-12 ENCOUNTER — Ambulatory Visit (INDEPENDENT_AMBULATORY_CARE_PROVIDER_SITE_OTHER): Payer: Medicare Other | Admitting: Family Medicine

## 2023-07-12 VITALS — BP 132/72 | HR 72 | Temp 98.0°F | Resp 14 | Ht 66.0 in | Wt 127.5 lb

## 2023-07-12 DIAGNOSIS — I272 Pulmonary hypertension, unspecified: Secondary | ICD-10-CM | POA: Diagnosis not present

## 2023-07-12 DIAGNOSIS — E785 Hyperlipidemia, unspecified: Secondary | ICD-10-CM | POA: Diagnosis not present

## 2023-07-12 DIAGNOSIS — G894 Chronic pain syndrome: Secondary | ICD-10-CM | POA: Diagnosis not present

## 2023-07-12 DIAGNOSIS — F5105 Insomnia due to other mental disorder: Secondary | ICD-10-CM

## 2023-07-12 DIAGNOSIS — D692 Other nonthrombocytopenic purpura: Secondary | ICD-10-CM

## 2023-07-12 DIAGNOSIS — I5042 Chronic combined systolic (congestive) and diastolic (congestive) heart failure: Secondary | ICD-10-CM

## 2023-07-12 DIAGNOSIS — F409 Phobic anxiety disorder, unspecified: Secondary | ICD-10-CM

## 2023-07-12 DIAGNOSIS — I480 Paroxysmal atrial fibrillation: Secondary | ICD-10-CM

## 2023-07-12 DIAGNOSIS — F411 Generalized anxiety disorder: Secondary | ICD-10-CM

## 2023-07-12 DIAGNOSIS — Z79899 Other long term (current) drug therapy: Secondary | ICD-10-CM

## 2023-07-12 DIAGNOSIS — E039 Hypothyroidism, unspecified: Secondary | ICD-10-CM | POA: Diagnosis not present

## 2023-07-12 DIAGNOSIS — Z23 Encounter for immunization: Secondary | ICD-10-CM | POA: Diagnosis not present

## 2023-07-12 MED ORDER — BUSPIRONE HCL 5 MG PO TABS
5.0000 mg | ORAL_TABLET | Freq: Two times a day (BID) | ORAL | 1 refills | Status: DC
Start: 2023-07-12 — End: 2024-01-10

## 2023-07-12 MED ORDER — TEMAZEPAM 15 MG PO CAPS: 15.0000 mg | ORAL_CAPSULE | Freq: Every day | ORAL | 1 refills | Status: DC

## 2023-07-13 ENCOUNTER — Other Ambulatory Visit: Payer: Self-pay | Admitting: Family Medicine

## 2023-07-13 DIAGNOSIS — E039 Hypothyroidism, unspecified: Secondary | ICD-10-CM

## 2023-07-13 LAB — CBC WITH DIFFERENTIAL/PLATELET
Absolute Monocytes: 711 cells/uL (ref 200–950)
Basophils Absolute: 18 cells/uL (ref 0–200)
Basophils Relative: 0.2 %
Eosinophils Absolute: 63 cells/uL (ref 15–500)
Eosinophils Relative: 0.7 %
HCT: 42.1 % (ref 35.0–45.0)
Hemoglobin: 14 g/dL (ref 11.7–15.5)
Lymphs Abs: 1062 cells/uL (ref 850–3900)
MCH: 33 pg (ref 27.0–33.0)
MCHC: 33.3 g/dL (ref 32.0–36.0)
MCV: 99.3 fL (ref 80.0–100.0)
MPV: 9.3 fL (ref 7.5–12.5)
Monocytes Relative: 7.9 %
Neutro Abs: 7146 cells/uL (ref 1500–7800)
Neutrophils Relative %: 79.4 %
Platelets: 223 10*3/uL (ref 140–400)
RBC: 4.24 10*6/uL (ref 3.80–5.10)
RDW: 12.8 % (ref 11.0–15.0)
Total Lymphocyte: 11.8 %
WBC: 9 10*3/uL (ref 3.8–10.8)

## 2023-07-13 LAB — COMPLETE METABOLIC PANEL WITH GFR
AG Ratio: 2.2 (calc) (ref 1.0–2.5)
ALT: 17 U/L (ref 6–29)
AST: 20 U/L (ref 10–35)
Albumin: 5 g/dL (ref 3.6–5.1)
Alkaline phosphatase (APISO): 64 U/L (ref 37–153)
BUN/Creatinine Ratio: 14 (calc) (ref 6–22)
BUN: 14 mg/dL (ref 7–25)
CO2: 27 mmol/L (ref 20–32)
Calcium: 9.4 mg/dL (ref 8.6–10.4)
Chloride: 99 mmol/L (ref 98–110)
Creat: 0.99 mg/dL — ABNORMAL HIGH (ref 0.60–0.95)
Globulin: 2.3 g/dL (calc) (ref 1.9–3.7)
Glucose, Bld: 107 mg/dL (ref 65–139)
Potassium: 5.1 mmol/L (ref 3.5–5.3)
Sodium: 138 mmol/L (ref 135–146)
Total Bilirubin: 0.6 mg/dL (ref 0.2–1.2)
Total Protein: 7.3 g/dL (ref 6.1–8.1)
eGFR: 58 mL/min/{1.73_m2} — ABNORMAL LOW (ref >=60)

## 2023-07-13 LAB — LIPID PANEL
Cholesterol: 152 mg/dL (ref <200)
HDL: 83 mg/dL (ref >=50)
LDL Cholesterol (Calc): 51 mg/dL (calc)
Non-HDL Cholesterol (Calc): 69 mg/dL (calc) (ref <130)
Total CHOL/HDL Ratio: 1.8 (calc) (ref <5.0)
Triglycerides: 101 mg/dL (ref <150)

## 2023-07-13 LAB — TSH: TSH: 3 mIU/L (ref 0.40–4.50)

## 2023-07-13 MED ORDER — LEVOTHYROXINE SODIUM 137 MCG PO TABS
ORAL_TABLET | ORAL | 1 refills | Status: DC
Start: 2023-07-13 — End: 2023-07-31

## 2023-07-18 ENCOUNTER — Other Ambulatory Visit: Payer: Self-pay | Admitting: Cardiovascular Disease

## 2023-07-23 DIAGNOSIS — M47812 Spondylosis without myelopathy or radiculopathy, cervical region: Secondary | ICD-10-CM | POA: Diagnosis not present

## 2023-07-30 ENCOUNTER — Other Ambulatory Visit: Payer: Self-pay | Admitting: Family Medicine

## 2023-07-30 DIAGNOSIS — E039 Hypothyroidism, unspecified: Secondary | ICD-10-CM

## 2023-08-08 ENCOUNTER — Other Ambulatory Visit: Payer: Self-pay | Admitting: Cardiovascular Disease

## 2023-10-26 ENCOUNTER — Other Ambulatory Visit: Payer: Self-pay | Admitting: Cardiovascular Disease

## 2023-12-24 ENCOUNTER — Other Ambulatory Visit (HOSPITAL_COMMUNITY): Payer: Self-pay

## 2023-12-24 ENCOUNTER — Telehealth: Payer: Self-pay | Admitting: Cardiovascular Disease

## 2023-12-24 DIAGNOSIS — E785 Hyperlipidemia, unspecified: Secondary | ICD-10-CM

## 2023-12-24 MED ORDER — ROSUVASTATIN CALCIUM 40 MG PO TABS
40.0000 mg | ORAL_TABLET | Freq: Every day | ORAL | 3 refills | Status: DC
  Filled 2023-12-24: qty 90, 90d supply, fill #0

## 2023-12-24 NOTE — Telephone Encounter (Signed)
*  STAT* If patient is at the pharmacy, call can be transferred to refill team.   1. Which medications need to be refilled? (please list name of each medication and dose if known) rosuvastatin (CRESTOR) 40 MG tablet   2. Which pharmacy/location (including street and city if local pharmacy) is medication to be sent to?  [id number] - [place], [place] - [address] AT [facility] OF [address]    3. Do they need a 30 day or 90 day supply? 90

## 2023-12-26 ENCOUNTER — Other Ambulatory Visit (HOSPITAL_COMMUNITY): Payer: Self-pay

## 2023-12-26 MED ORDER — ROSUVASTATIN CALCIUM 40 MG PO TABS: 40.0000 mg | ORAL_TABLET | Freq: Every day | ORAL | 3 refills | Status: AC

## 2023-12-26 NOTE — Telephone Encounter (Signed)
Requested Prescriptions   Signed Prescriptions Disp Refills  . rosuvastatin (CRESTOR) 40 MG tablet 90 tablet 3    Sig: Take 1 tablet (40 mg total) by mouth daily.    Authorizing Provider: Minna Merritts    Ordering User: Britt Bottom

## 2023-12-26 NOTE — Addendum Note (Signed)
 Addended by: Kendrick Fries on: 12/26/2023 10:29 AM   Modules accepted: Orders

## 2023-12-26 NOTE — Telephone Encounter (Signed)
 This refill has been sent to the wrong pharmacy. Requesting this to be sent again to:  [id number] - [place], [place] - [address] AT [facility] OF [address] [facility]

## 2023-12-27 NOTE — Telephone Encounter (Signed)
 Patient stated pharmacy said they had not received the refill requests and wants the refill prescription re-sent to [id number] - [place], [place] - [address] AT [facility] OF [address].

## 2023-12-27 NOTE — Telephone Encounter (Signed)
 Disp Refills Start End   rosuvastatin (CRESTOR) 40 MG tablet 90 tablet 3 12/26/2023 --   Sig - Route: Take 1 tablet (40 mg total) by mouth daily. - Oral   Sent to pharmacy as: rosuvastatin (CRESTOR) 40 MG tablet   E-Prescribing Status: Receipt confirmed by pharmacy (12/26/2023 10:29 AM EST)

## 2023-12-31 NOTE — Progress Notes (Unsigned)
 Date:  01/01/2024   ID:  [name], DOB [birth year], MRN [id number]  Patient Location:  [address] [place] [address]   Provider location: Hypertension SVT [facility] [place], [place] office  PCP:  Alba Cory, MD  Cardiologist:  None   Chief Complaint  Patient presents with   12 month follow up     "Doing well."     History of Present Illness:    [name] is a 83 y.o. female  past medical history of Hypertension SVT Bradycardia, likely sick sinus syndrome catheter ablation of AV node reentrant tachycardia 2013 sinus tachycardia Carotid u/s  01/2018 with no significant blockages, bruit on the left Ejection fraction 45 to 50%, mild to moderately elevated right heart pressures on echo May 2019 Several cardioversions for atrial fibrillation June and July 2019 Echo 10/22: normal EF  Not on a beta-blocker due to bradycardia and first-degree AV block  Who presents for follow-up of her paroxysmal atrial fibrillation  Last seen by myself in clinic 3/23 Seen by one of our providers March 2024  In normal sinus rhythm on today's visit Having back and neck problems Hx of neck surgery Stress at home, husband with Life vest  Rare palpitations, 1-2 x a week for a minutes Previously taken off metoprolol for bradycardia  BP up today, typically runs high in the doctor's office Home number 145/60s before meds After meds 130s/60s  Lab work reviewed Total chol 152, LDL 51  EKG personally reviewed by myself on todays visit EKG Interpretation Date/Time:  Tuesday January 01 2024 10:10:37 EDT Ventricular Rate:  70 PR Interval:  196 QRS Duration:  154 QT Interval:  438 QTC Calculation: 473 R Axis:   -34  Text Interpretation: Sinus rhythm with Premature supraventricular complexes Left axis deviation Left bundle branch block When compared with ECG of 23-Jul-2021 08:57, No significant change was found Confirmed by Julien Nordmann [phone]) on 01/01/2024  10:17:00 AM    Discussed events from 2022, hospital October 30, 2020 acute respiratory failure with hypoxia Pneumonia, multifocal bilateral Treated with Solu-Medrol, nebulizers, Levaquin Diovan was held at discharge for low pressure  Echo at that time showed an EF of 60 to 65%, no regional wall motion abnormalities, grade 1 diastolic dysfunction, normal RV systolic function and ventricular cavity size, moderately elevated PASP estimated at 54.8 mmHg, and mild to moderate mitral valve regurgitation with 2 jets noted.  hospital in 07/2021 with near syncope and possible orthostatic hypotension.  in severe pain , herpes zoster, chronic neck pain  Echocardiogram October 2022, normal LV function, mild MR  TEE December 2022 presumably ordered to look at mitral valve normal pump function of both the left and right side of the heart, mildly dilated left atrium without evidence of thrombus, mild regurgitation of the mitral valve.  In follow-up today reports that she is doing well, active, blood pressures at home typically 140/60 heart rates 50s Blood pressure often will drop in the morning before she has taken her medications, sometimes feels dizzy Continues to take Lasix 20 daily Working on increasing her fluid intake    Other past medical history reviewed Lumbar films showed multiple level spondylosis and spurs worse at L1-2, L2-3, and L3-4.   Seen by [facility]  s/p  left L4-5 microdiscectomy.  hospital September 2019 hypokalemia, cystitis with hematuria, acute kidney injury On arrival potassium was 2.6 creatinine 1.57   hospital July 2019 for acute on chronic combined CHF Persistent  atrial fibrillation with RVR and respiratory distress with hypoxia Treated with Lasix Status post briefly successful DCCV on 6/4.  back in A. fib approximately 6/7 to 6/8,  DCCV  7/3, successful, bradycardia   Event monitor paroxysmal atrial fibrillation  Past Medical History:  Diagnosis  Date   Anxiety    Bronchitis    Chronic combined systolic (congestive) and diastolic (congestive) heart failure (HCC)    a. 02/2018 Echo: EF 45-50%, diff HK, triv AI, mod MR, mildly dil LA/RA, nl RV fxn, mod TR. PASP 40-75mmHg.   Chronic kidney disease    DJD (degenerative joint disease), cervical    History of stress test    a. 01/2008 MV: EF 76%. Fair ex tol. No ischemia.   Hyperlipidemia    Hypertension    Hypothyroid    LBBB (left bundle branch block)    Leg pain    a. 01/2018 ABI's wnl.   Lumbar spondylosis    Persistent atrial fibrillation (HCC)    a. 01/2018 Event monitor: 45 runs of SVT, longest 14.5 sec. SVT felt to be Afib/flutter-->2% burden. Longest run of AF 4h 66m (CHA2DS2VASc = 5-->Xarelto); b. 03/2019 s/p DCCV; c. 04/2019 Repeat Amio load and DCCV.   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 Ladona Ridgel 5/13   Past Surgical History:  Procedure Laterality Date   BACK SURGERY  07/2019   CARDIOVERSION N/A 03/26/2018   Procedure: CARDIOVERSION;  Surgeon: Yvonne Kendall, MD;  Location: [facility] ORS;  Service: Cardiovascular;  Laterality: N/A;   CARDIOVERSION N/A 04/24/2018   Procedure: CARDIOVERSION;  Surgeon: Antonieta Iba, MD;  Location: [facility] ORS;  Service: Cardiovascular;  Laterality: N/A;   CATARACT EXTRACTION W/PHACO Right 07/12/2021   Procedure: CATARACT EXTRACTION PHACO AND INTRAOCULAR LENS PLACEMENT (IOC) RIGHT 16.92 01:30.8 ;  Surgeon: Galen Manila, MD;  Location: [facility];  Service: Ophthalmology;  Laterality: Right;   ELECTROPHYSIOLOGY STUDY N/A 02/27/2012   Procedure: ELECTROPHYSIOLOGY STUDY;  Surgeon: Marinus Maw, MD;  Location: [facility] CATH LAB;  Service: Cardiovascular;  Laterality: N/A;   EPS and ablation for SVT  5/13   slow pathway ablation by Dr Ladona Ridgel   EYE SURGERY     surgery for detatched retina   NECK SURGERY  02/2020   SUPRAVENTRICULAR TACHYCARDIA ABLATION N/A 02/27/2012   Procedure: SUPRAVENTRICULAR TACHYCARDIA  ABLATION;  Surgeon: Marinus Maw, MD;  Location: [facility] CATH LAB;  Service: Cardiovascular;  Laterality: N/A;   TEE WITHOUT CARDIOVERSION N/A 10/19/2021   Procedure: TRANSESOPHAGEAL ECHOCARDIOGRAM (TEE);  Surgeon: Debbe Odea, MD;  Location: [facility] ORS;  Service: Cardiovascular;  Laterality: N/A;     Current Meds  Medication Sig   acetaminophen (TYLENOL) 650 MG CR tablet Take 650-1,300 mg by mouth every 8 (eight) hours as needed for pain.    amiodarone (PACERONE) 200 MG tablet TAKE 1 TABLET(200 MG) BY MOUTH DAILY   amLODipine (NORVASC) 5 MG tablet TAKE 1 TABLET(5 MG) BY MOUTH DAILY   apixaban (ELIQUIS) 2.5 MG TABS tablet Take 1 tablet (2.5 mg total) by mouth 2 (two) times daily.   busPIRone (BUSPAR) 5 MG tablet Take 1 tablet (5 mg total) by mouth 2 (two) times daily.   cholecalciferol (VITAMIN D) 1000 UNITS tablet Take 2,000 Units by mouth daily.   Cranberry 500 MG TABS Take 2 tablets by mouth daily.   doxazosin (CARDURA) 1 MG tablet TAKE 1 TABLET(1 MG) BY MOUTH EVERY EVENING   furosemide (LASIX) 20 MG tablet TAKE 1 TABLET(20 MG)  BY MOUTH DAILY   levothyroxine (SYNTHROID) 137 MCG tablet TAKE 1 TABLET(137 MCG) BY MOUTH DAILY BEFORE BREAKFAST   loratadine (CLARITIN) 10 MG tablet Take 10 mg by mouth daily as needed for allergies or rhinitis.   Melatonin 10 MG TABS Take 10 mg by mouth at bedtime as needed.   Multiple Vitamin (MULITIVITAMIN WITH MINERALS) TABS Take 1 tablet by mouth daily.   potassium chloride (KLOR-CON) 10 MEQ tablet TAKE 1 TABLET BY MOUTH EVERY DAY AS DIRECTED. MAY TAKE AN EXTRA AFTER LUNCH WITH FLUID PILL AS NEEDED FOR SWELLING   rosuvastatin (CRESTOR) 40 MG tablet Take 1 tablet (40 mg total) by mouth daily.   temazepam (RESTORIL) 15 MG capsule Take 1 capsule (15 mg total) by mouth at bedtime.     Allergies:   Chocolate, Penicillins, Codeine, Erythromycin, and Septra [sulfamethoxazole-trimethoprim]   Social History   Tobacco Use   Smoking status: Never   Smokeless  tobacco: Never   Tobacco comments:    smoking cessation materials not required  Vaping Use   Vaping status: Never Used  Substance Use Topics   Alcohol use: No   Drug use: No     Family Hx: family history includes Alzheimer's disease in her maternal grandfather and mother; Breast cancer in her father and paternal aunt; Cancer (age of onset: 68) in her daughter; Heart disease in her father; Kidney cancer in her maternal grandmother; Stroke in her father and paternal grandfather; Thyroid disease in her paternal grandmother.  ROS:   Please see the history of present illness.    Review of Systems  Constitutional: Negative.        Weak  HENT: Negative.    Respiratory: Negative.    Cardiovascular: Negative.   Gastrointestinal: Negative.   Musculoskeletal: Negative.        Gait instability  Neurological: Negative.   Psychiatric/Behavioral: Negative.    All other systems reviewed and are negative.    Labs/Other Tests and Data Reviewed:    Recent Labs: 07/12/2023: ALT 17; BUN 14; Creat 0.99; Hemoglobin 14.0; Platelets 223; Potassium 5.1; Sodium 138; TSH 3.00   Recent Lipid Panel Lab Results  Component Value Date/Time   CHOL 152 07/12/2023 09:52 AM   CHOL 164 06/06/2016 09:07 AM   TRIG 101 07/12/2023 09:52 AM   HDL 83 07/12/2023 09:52 AM   HDL 54 06/06/2016 09:07 AM   CHOLHDL 1.8 07/12/2023 09:52 AM   LDLCALC 51 07/12/2023 09:52 AM    Wt Readings from Last 3 Encounters:  01/01/24 125 lb 2 oz (56.8 kg)  07/12/23 127 lb 8 oz (57.8 kg)  06/05/23 125 lb (56.7 kg)     Exam:    Vital Signs:  BP (!) 160/70 (BP Location: Left Arm, Patient Position: Sitting, Cuff Size: Normal)   Pulse 70   Ht 5\' 6"  (1.676 m)   Wt 125 lb 2 oz (56.8 kg)   SpO2 98%   BMI 20.20 kg/m    Constitutional:  oriented to person, place, and time. No distress.  HENT:  Head: Grossly normal Eyes:  no discharge. No scleral icterus.  Neck: No JVD, no carotid bruits  Cardiovascular: Regular rate and  rhythm, no murmurs appreciated Pulmonary/Chest: Clear to auscultation bilaterally, no wheezes or rails Abdominal: Soft.  no distension.  no tenderness.  Musculoskeletal: Normal range of motion Neurological:  normal muscle tone. Coordination normal. No atrophy Skin: Skin warm and dry Psychiatric: normal affect, pleasant   ASSESSMENT & PLAN:     Chronic systolic heart  failure (HCC) Appears euvolemic, recommend she continue Lasix 20 daily with potassium Close monitoring of blood pressure  Paroxysmal atrial fibrillation (HCC) On Eliquis amiodarone Metoprolol previously held for bradycardia Rare palpitations 1 or 2 times a week lasting for several minutes at a time, resolves without intervention Recommended if symptoms persist she could take extra amiodarone For increasing frequency recommend she call our office  Essential hypertension Reports blood pressure typically in the 130 range at home on her medications, 140 before medications Elevated today, no changes made.  Recommend continued monitoring at home  PAD (peripheral artery disease) (HCC) Carotid bruit on left, U/s 01/2018 no stenosis and 2023 minimal disease   SVT (supraventricular tachycardia) (HCC) Prior ablation On amiodarone  Mixed hyperlipidemia Lipids at goal, continue Crestor  pulmonary hypertension, unspecified (HCC) treated with Lasix daily BMP stable, denies shortness of breath  CKD (chronic kidney disease), stage III (HCC) Stable renal function   Signed, Julien Nordmann, MD  01/01/2024 10:16 AM    [facility] [address], [place], [place] [address]

## 2024-01-01 ENCOUNTER — Encounter: Payer: Self-pay | Admitting: Cardiovascular Disease

## 2024-01-01 ENCOUNTER — Ambulatory Visit: Payer: Medicare Other | Attending: Cardiovascular Disease | Admitting: Cardiovascular Disease

## 2024-01-01 VITALS — BP 160/70 | HR 70 | Ht 66.0 in | Wt 125.1 lb

## 2024-01-01 DIAGNOSIS — I10 Essential (primary) hypertension: Secondary | ICD-10-CM | POA: Diagnosis not present

## 2024-01-01 DIAGNOSIS — I340 Nonrheumatic mitral (valve) insufficiency: Secondary | ICD-10-CM

## 2024-01-01 DIAGNOSIS — E782 Mixed hyperlipidemia: Secondary | ICD-10-CM

## 2024-01-01 DIAGNOSIS — I5032 Chronic diastolic (congestive) heart failure: Secondary | ICD-10-CM

## 2024-01-01 DIAGNOSIS — I480 Paroxysmal atrial fibrillation: Secondary | ICD-10-CM

## 2024-01-01 DIAGNOSIS — E785 Hyperlipidemia, unspecified: Secondary | ICD-10-CM

## 2024-01-01 DIAGNOSIS — I5022 Chronic systolic (congestive) heart failure: Secondary | ICD-10-CM | POA: Diagnosis not present

## 2024-01-01 DIAGNOSIS — I5042 Chronic combined systolic (congestive) and diastolic (congestive) heart failure: Secondary | ICD-10-CM

## 2024-01-01 DIAGNOSIS — I447 Left bundle-branch block, unspecified: Secondary | ICD-10-CM

## 2024-01-01 DIAGNOSIS — I272 Pulmonary hypertension, unspecified: Secondary | ICD-10-CM

## 2024-01-01 MED ORDER — FUROSEMIDE 20 MG PO TABS: 20.0000 mg | ORAL_TABLET | Freq: Every day | ORAL | 3 refills | Status: DC

## 2024-01-01 MED ORDER — POTASSIUM CHLORIDE ER 10 MEQ PO TBCR: EXTENDED_RELEASE_TABLET | ORAL | 2 refills | Status: AC

## 2024-01-01 MED ORDER — AMLODIPINE BESYLATE 5 MG PO TABS: ORAL_TABLET | ORAL | 3 refills | Status: DC

## 2024-01-01 MED ORDER — AMIODARONE HCL 200 MG PO TABS: 200.0000 mg | ORAL_TABLET | Freq: Every day | ORAL | 3 refills | Status: DC

## 2024-01-01 MED ORDER — DOXAZOSIN MESYLATE 1 MG PO TABS: 1.0000 mg | ORAL_TABLET | Freq: Every day | ORAL | 3 refills | Status: AC

## 2024-01-01 NOTE — Patient Instructions (Signed)

## 2024-01-10 ENCOUNTER — Encounter: Payer: Self-pay | Admitting: Family Medicine

## 2024-01-10 ENCOUNTER — Ambulatory Visit (INDEPENDENT_AMBULATORY_CARE_PROVIDER_SITE_OTHER): Payer: Medicare Other | Admitting: Family Medicine

## 2024-01-10 VITALS — BP 172/70 | HR 83 | Resp 16 | Ht 66.0 in | Wt 126.6 lb

## 2024-01-10 DIAGNOSIS — N1831 Chronic kidney disease, stage 3a: Secondary | ICD-10-CM | POA: Diagnosis not present

## 2024-01-10 DIAGNOSIS — F411 Generalized anxiety disorder: Secondary | ICD-10-CM

## 2024-01-10 DIAGNOSIS — I272 Pulmonary hypertension, unspecified: Secondary | ICD-10-CM | POA: Diagnosis not present

## 2024-01-10 DIAGNOSIS — F409 Phobic anxiety disorder, unspecified: Secondary | ICD-10-CM

## 2024-01-10 DIAGNOSIS — F5105 Insomnia due to other mental disorder: Secondary | ICD-10-CM

## 2024-01-10 DIAGNOSIS — I480 Paroxysmal atrial fibrillation: Secondary | ICD-10-CM

## 2024-01-10 DIAGNOSIS — I5042 Chronic combined systolic (congestive) and diastolic (congestive) heart failure: Secondary | ICD-10-CM | POA: Diagnosis not present

## 2024-01-10 DIAGNOSIS — I10 Essential (primary) hypertension: Secondary | ICD-10-CM

## 2024-01-10 DIAGNOSIS — E039 Hypothyroidism, unspecified: Secondary | ICD-10-CM

## 2024-01-10 DIAGNOSIS — G894 Chronic pain syndrome: Secondary | ICD-10-CM

## 2024-01-10 MED ORDER — LEVOTHYROXINE SODIUM 137 MCG PO TABS
ORAL_TABLET | ORAL | 1 refills | Status: DC
Start: 2024-01-10 — End: 2024-07-10

## 2024-01-10 MED ORDER — BUSPIRONE HCL 5 MG PO TABS: 5.0000 mg | ORAL_TABLET | Freq: Three times a day (TID) | ORAL | 1 refills | Status: DC

## 2024-01-10 MED ORDER — TEMAZEPAM 15 MG PO CAPS: 15.0000 mg | ORAL_CAPSULE | Freq: Every day | ORAL | 1 refills | Status: DC

## 2024-01-10 NOTE — Progress Notes (Signed)
 Name: [name]   MRN: [id number]    DOB: [birth year]   Date:01/10/2024       Progress Note  Subjective  Chief Complaint  Chief Complaint  Patient presents with   Medical Management of Chronic Issues   HPI   GAD: daughter diagnosed with ovarian cancer at age 82 and husband is having syncopal episodes waiting to have a pacemaker placed. 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 Stable   Hypothyroidism: she has been on the same dose for a long time 137 mcg daily  compliant, no dysphagia, hair loss  or change in bowel movements. TSH is at goal and we will send refill of medication   Skin Cancer: seen by Dr. Cheree Ditto,  she has an upcoming visit with Dr. Cheree Ditto    Chronic pain syndrome: she had c-spine surgery done by  Dr. Fabiola Backer April 2021 and was  doing well, symptoms started to bother her again had repeat MRI c-spine and lumbar spine 2022 , is now seeing Dr. Belva Bertin [facility]. She has  injections on her neck and back,every few months to control pain, she had two rounds of ablation on neck and back Summer 2024. Pain level is 5-6/10 on her neck, back is also bothersome but not as much    Carotid calcification: found on x-ray of neck. She also had a vascular study done back in 2011 that showed mild plaque formation on right. She is now on Crestor and  Eliquis.  Dr. Fabiola Backer thinks balanced disturbance is likely from neck surgery. She went back to Dr. Phineas Semen and had ABI that was normal, carotid doppler was due in 2021 She also has a history of pulmonary hypertension - under the care of Dr. Mariah Milling  History of SVT/AF  but doing well on diuretics daily  and amiodarone  She is also on Eliquis 2.5 mg daily    HTN: bp is very high today, she has not been sleeping well, worried about her husband.. She is on Norvasc and Cardura , managed by cardiologist    CKI stage IIIa: GFR is stable, avoiding NSAID's she has good urine  output, occasionally has pruritus but from dry skin  Afib : she had a cardioversion in June 2019  and another one in July 2019 She is on amiodarone for rhythm control also takes Eliquis. She has occasional palpitation, at least once a month    CHF chronic/pulmonary hypertension: she is down to one pillow at night. She has mild sob with moderate activity, no chest pain, she has intermittent lower extremity edema and is taking extra lasix and potassium at lunch prn as instructed by Dr. Mariah Milling . Off ARB and Beta blockers due to hypotension and bradycardia Stable   Senile Purpura: both arms and legs, on Eliquis. 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   Pulmonary hypertension (HCC) 04/05/2018   Paroxysmal sinus tachycardia (HCC)  01/29/2018   Leg pain 01/04/2018   Carotid artery calcification, bilateral 12/19/2017   Cervical radiculitis 12/19/2017   Hyperglycemia 03/06/2016   Hypothyroidism in adult 04/21/2015   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   Anxiety 04/21/2015   Hyperlipidemia 04/21/2015   Diuretic-induced hypokalemia 04/21/2015   SVT (supraventricular tachycardia) (HCC) 05/27/2012   Hypertension 05/27/2012    Past Surgical History:  Procedure Laterality Date   BACK SURGERY  07/2019   CARDIOVERSION N/A 03/26/2018   Procedure: CARDIOVERSION;  Surgeon: Yvonne Kendall, MD;  Location: [facility] ORS;   Service: Cardiovascular;  Laterality: N/A;   CARDIOVERSION N/A 04/24/2018   Procedure: CARDIOVERSION;  Surgeon: Antonieta Iba, MD;  Location: [facility] ORS;  Service: Cardiovascular;  Laterality: N/A;   CATARACT EXTRACTION W/PHACO Right 07/12/2021   Procedure: CATARACT EXTRACTION PHACO AND INTRAOCULAR LENS PLACEMENT (IOC) RIGHT 16.92 01:30.8 ;  Surgeon: Galen Manila, MD;  Location: [facility];  Service: Ophthalmology;  Laterality: Right;   ELECTROPHYSIOLOGY STUDY N/A 02/27/2012   Procedure: ELECTROPHYSIOLOGY STUDY;  Surgeon: Marinus Maw, MD;  Location: [facility] CATH LAB;  Service: Cardiovascular;  Laterality: N/A;   EPS and ablation for SVT  5/13   slow pathway ablation by Dr Ladona Ridgel   EYE SURGERY     surgery for detatched retina   NECK SURGERY  02/2020   SUPRAVENTRICULAR TACHYCARDIA ABLATION N/A 02/27/2012   Procedure: SUPRAVENTRICULAR TACHYCARDIA ABLATION;  Surgeon: Marinus Maw, MD;  Location: [facility] CATH LAB;  Service: Cardiovascular;  Laterality: N/A;   TEE WITHOUT CARDIOVERSION N/A 10/19/2021   Procedure: TRANSESOPHAGEAL ECHOCARDIOGRAM (TEE);  Surgeon: Debbe Odea, MD;  Location: [facility] ORS;  Service: Cardiovascular;  Laterality: N/A;    Family History  Problem Relation Age of Onset   Alzheimer's disease Mother    Stroke Father    Breast cancer Father    Heart disease Father        Pacemaker   Kidney cancer Maternal Grandmother    Alzheimer's disease Maternal Grandfather    Thyroid disease Paternal Grandmother    Stroke Paternal Grandfather    Breast cancer Paternal Aunt    Cancer Daughter 57       ovarian cancer    Social History   Tobacco Use   Smoking status: Never   Smokeless tobacco: Never   Tobacco comments:    smoking cessation materials not required  Substance Use Topics   Alcohol use: No     Current Outpatient Medications:    acetaminophen (TYLENOL) 650 MG CR tablet, Take 650-1,300 mg by mouth every 8 (eight) hours as needed for pain. , Disp: , Rfl:     amiodarone (PACERONE) 200 MG tablet, Take 1 tablet (200 mg total) by mouth daily., Disp: 90 tablet, Rfl: 3   amLODipine (NORVASC) 5 MG tablet, TAKE 1 TABLET(5 MG) BY MOUTH DAILY, Disp: 90 tablet, Rfl: 3   apixaban (ELIQUIS) 2.5 MG TABS tablet, Take 1 tablet (2.5 mg total) by mouth 2 (two) times daily., Disp: 180 tablet, Rfl: 3   cholecalciferol (VITAMIN D) 1000 UNITS tablet, Take 2,000 Units by mouth daily., Disp: , Rfl:    Cranberry 500 MG TABS, Take 2 tablets by mouth daily., Disp: , Rfl:    doxazosin (CARDURA) 1 MG tablet, Take 1 tablet (1 mg total) by mouth daily., Disp: 90 tablet, Rfl: 3   furosemide (LASIX) 20 MG tablet, Take 1 tablet (20 mg total) by mouth daily., Disp: 90 tablet, Rfl: 3   loratadine (CLARITIN) 10 MG tablet, Take 10  mg by mouth daily as needed for allergies or rhinitis., Disp: , Rfl:    Melatonin 10 MG TABS, Take 10 mg by mouth at bedtime as needed., Disp: , Rfl:    Multiple Vitamin (MULITIVITAMIN WITH MINERALS) TABS, Take 1 tablet by mouth daily., Disp: , Rfl:    potassium chloride (KLOR-CON) 10 MEQ tablet, TAKE 1 TABLET BY MOUTH EVERY DAY AS DIRECTED. MAY TAKE AN EXTRA AFTER LUNCH WITH FLUID PILL AS NEEDED FOR SWELLING, Disp: 180 tablet, Rfl: 2   rosuvastatin (CRESTOR) 40 MG tablet, Take 1 tablet (40 mg total) by mouth daily., Disp: 90 tablet, Rfl: 3   busPIRone (BUSPAR) 5 MG tablet, Take 1 tablet (5 mg total) by mouth 3 (three) times daily., Disp: 270 tablet, Rfl: 1   levothyroxine (SYNTHROID) 137 MCG tablet, TAKE 1 TABLET(137 MCG) BY MOUTH DAILY BEFORE BREAKFAST, Disp: 90 tablet, Rfl: 1   temazepam (RESTORIL) 15 MG capsule, Take 1 capsule (15 mg total) by mouth at bedtime., Disp: 90 capsule, Rfl: 1  Allergies  Allergen Reactions   Chocolate Swelling   Penicillins Swelling and Rash    Has patient had a PCN reaction causing immediate rash, facial/tongue/throat swelling, SOB or lightheadedness with hypotension: Yes Has patient had a PCN reaction causing severe rash  involving mucus membranes or skin necrosis: No Has patient had a PCN reaction that required hospitalization: No Has patient had a PCN reaction occurring within the last 10 years: No If all of the above answers are "NO", then may proceed with Cephalosporin use.    Codeine Nausea And Vomiting   Erythromycin Itching and Swelling   Septra [Sulfamethoxazole-Trimethoprim] Swelling    I personally reviewed active problem list, medication list, allergies, family history with the patient/caregiver today.   ROS  Ten systems reviewed and is negative except as mentioned in HPI    Objective  Vitals:   01/10/24 0919 01/10/24 0958  BP: (!) 184/74 (!) 172/70  Pulse: 83   Resp: 16   SpO2: 99%   Weight: 126 lb 9.6 oz (57.4 kg)   Height: 5\' 6"  (1.676 m)     Body mass index is 20.43 kg/m.  Physical Exam  Constitutional: Patient appears well-developed and well-nourished.  No distress.  HEENT: head atraumatic, normocephalic, pupils equal and reactive to light, neck supple Cardiovascular: Normal rate, regular rhythm and normal heart sounds.  No murmur heard. No BLE edema. Pulmonary/Chest: Effort normal and breath sounds normal. No respiratory distress. Abdominal: Soft.  There is no tenderness. Psychiatric: Patient has a normal mood and affect. behavior is normal. Judgment and thought content normal.   Diabetic Foot Exam:     PHQ2/9:    01/10/2024    9:19 AM 07/12/2023    8:57 AM 06/05/2023    7:56 AM 01/09/2023    8:40 AM 07/11/2022    8:31 AM  Depression screen PHQ 2/9  Decreased Interest 1 1 0 0 0  Down, Depressed, Hopeless 1 2 0 0 0  PHQ - 2 Score 2 3 0 0 0  Altered sleeping 0 0 0 0 0  Tired, decreased energy 0 0 0 0 0  Change in appetite 0 0 0 0 0  Feeling bad or failure about yourself  0 0 0 0 0  Trouble concentrating 1 0 0 0 0  Moving slowly or fidgety/restless 0 0 0 0 0  Suicidal thoughts 0 0 0 0 0  PHQ-9 Score 3 3 0 0 0  Difficult doing work/chores Somewhat difficult  Somewhat  difficult       phq 9 is positive  Fall Risk:    01/10/2024    9:06 AM 07/12/2023    8:57 AM 06/05/2023    7:55 AM 01/09/2023    8:45 AM 07/11/2022    8:30 AM  Fall Risk   Falls in the past year? 0 0 0 0 0  Number falls in past yr: 0  0 0 0  Injury with Fall? 0  0 0 0  Risk for fall due to : No Fall Risks No Fall Risks No Fall Risks Impaired balance/gait No Fall Risks  Follow up Falls prevention discussed;Education provided;Falls evaluation completed Falls prevention discussed Falls prevention discussed Falls prevention discussed Falls prevention discussed     Assessment & Plan  1. AF (paroxysmal atrial fibrillation) (HCC) (Primary)  Rate controlled on eliquis  2. Pulmonary hypertension (HCC)  Keep visits with cardiologist   3. Stage 3a chronic kidney disease (HCC)  Stable   4. Chronic combined systolic (congestive) and diastolic (congestive) heart failure (HCC)  Stable , compliant   5. GAD (generalized anxiety disorder)  - busPIRone (BUSPAR) 5 MG tablet; Take 1 tablet (5 mg total) by mouth 3 (three) times daily.  Dispense: 270 tablet; Refill: 1  6. Insomnia due to anxiety and fear  - temazepam (RESTORIL) 15 MG capsule; Take 1 capsule (15 mg total) by mouth at bedtime.  Dispense: 90 capsule; Refill: 1  7. Hypothyroidism in adult  - levothyroxine (SYNTHROID) 137 MCG tablet; TAKE 1 TABLET(137 MCG) BY MOUTH DAILY BEFORE BREAKFAST  Dispense: 90 tablet; Refill: 1  8. Essential hypertension with white coat syndrome  Continue medications, states at home bp is at goal   9. Chronic pain syndrome  Stable

## 2024-01-14 ENCOUNTER — Other Ambulatory Visit: Payer: Self-pay | Admitting: Medical

## 2024-01-14 NOTE — Telephone Encounter (Signed)
 Prescription refill request for Eliquis received. Indication: PAF Last office visit: 01/01/24  Concha Se MD Scr: 0.99 on 07/12/23  Epic Age: 82 Weight: 56.8kg  Based on above findings Eliquis 2.5mg  twice daily is the appropriate dose.  Refill approved.

## 2024-01-16 ENCOUNTER — Other Ambulatory Visit: Payer: Self-pay | Admitting: Cardiovascular Disease

## 2024-01-23 ENCOUNTER — Telehealth: Payer: Self-pay | Admitting: Family Medicine

## 2024-01-23 NOTE — Telephone Encounter (Signed)
 Copied from CRM [id number]. Topic: General - Other >> Jan 22, 2024  2:46 PM Yolanda T wrote: Reason for CRM: patient recvd a letter from [facility] saying Dr Carlynn Purl was no longer in network. Please f/u with patient to let her know the status of her insurance

## 2024-01-23 NOTE — Telephone Encounter (Signed)
 Spoke with pt and informed that we are in network with [facility]. Pt verbalized understanding

## 2024-02-07 ENCOUNTER — Other Ambulatory Visit: Payer: Self-pay | Admitting: Family Medicine

## 2024-02-07 DIAGNOSIS — F409 Phobic anxiety disorder, unspecified: Secondary | ICD-10-CM

## 2024-02-07 NOTE — Telephone Encounter (Signed)
 Copied from CRM [id number]. Topic: Clinical - Medication Refill >> Feb 07, 2024  4:11 PM Yolanda T wrote: Most Recent Primary Care Visit:  Provider: Arleen Lacer  Department: [facility]  Visit Type: OFFICE VISIT  Date: 01/10/2024  Medication: temazepam (RESTORIL) 15 MG capsule  Has the patient contacted their pharmacy? Yes Pharmacy said they were waiting on the refill request from provider  Is this the correct pharmacy for this prescription? Yes If no, delete pharmacy and type the correct one.  This is the patient's preferred pharmacy:  [id number] - [place], [place] - [address] AT [facility] [address] [place] [address]  [place] [address] Phone: [phone] Fax: [phone]   Has the prescription been filled recently? Yes  Is the patient out of the medication? Yes  Has the patient been seen for an appointment in the last year OR does the patient have an upcoming appointment? Yes  Can we respond through MyChart? Yes  Agent: Please be advised that Rx refills may take up to 3 business days. We ask that you follow-up with your pharmacy.  Patient said she need her medication no later than tomorrow

## 2024-02-08 MED ORDER — TEMAZEPAM 15 MG PO CAPS: 15.0000 mg | ORAL_CAPSULE | Freq: Every day | ORAL | 0 refills | Status: DC

## 2024-02-08 NOTE — Telephone Encounter (Signed)
 Requested medication (s) are due for refill today - no  Requested medication (s) are on the active medication list- yes  Future visit scheduled -yes  Last refill: 01/10/24 #90 1RF  Notes to clinic: non delegated Rx  Requested Prescriptions  Pending Prescriptions Disp Refills   temazepam  (RESTORIL ) 15 MG capsule 90 capsule 1    Sig: Take 1 capsule (15 mg total) by mouth at bedtime.     Not Delegated - Psychiatry: Anxiolytics/Hypnotics 2 Failed - 02/08/2024  8:24 AM      Failed - This refill cannot be delegated      Failed - Urine Drug Screen completed in last 360 days      Passed - Patient is not pregnant      Passed - Valid encounter within last 6 months    Recent Outpatient Visits           4 weeks ago AF (paroxysmal atrial fibrillation) [facility])   [facility] Arleen Lacer, MD       Future Appointments             In 5 months Sowles, Krichna, MD [facility], [facility]               Requested Prescriptions  Pending Prescriptions Disp Refills   temazepam  (RESTORIL ) 15 MG capsule 90 capsule 1    Sig: Take 1 capsule (15 mg total) by mouth at bedtime.     Not Delegated - Psychiatry: Anxiolytics/Hypnotics 2 Failed - 02/08/2024  8:24 AM      Failed - This refill cannot be delegated      Failed - Urine Drug Screen completed in last 360 days      Passed - Patient is not pregnant      Passed - Valid encounter within last 6 months    Recent Outpatient Visits           4 weeks ago AF (paroxysmal atrial fibrillation) [facility])   [facility] Arleen Lacer, MD       Future Appointments             In 5 months Ava Lei, Krichna, MD [facility], [facility]

## 2024-04-23 ENCOUNTER — Encounter: Payer: Self-pay | Admitting: Emergency Medicine

## 2024-04-23 ENCOUNTER — Emergency Department
Admission: EM | Admit: 2024-04-23 | Discharge: 2024-04-23 | Disposition: A | Discharge status: Home / Self Care | Attending: Emergency Medicine | Admitting: Emergency Medicine

## 2024-04-23 ENCOUNTER — Other Ambulatory Visit: Payer: Self-pay

## 2024-04-23 ENCOUNTER — Emergency Department

## 2024-04-23 DIAGNOSIS — Z7901 Long term (current) use of anticoagulants: Secondary | ICD-10-CM | POA: Diagnosis not present

## 2024-04-23 DIAGNOSIS — I509 Heart failure, unspecified: Secondary | ICD-10-CM | POA: Diagnosis not present

## 2024-04-23 DIAGNOSIS — I110 Hypertensive heart disease with heart failure: Secondary | ICD-10-CM | POA: Diagnosis not present

## 2024-04-23 DIAGNOSIS — I4891 Unspecified atrial fibrillation: Secondary | ICD-10-CM | POA: Diagnosis not present

## 2024-04-23 DIAGNOSIS — R002 Palpitations: Secondary | ICD-10-CM | POA: Diagnosis present

## 2024-04-23 LAB — PROTIME-INR
INR: 1.1 (ref 0.8–1.2)
Prothrombin Time: 15 s (ref 11.4–15.2)

## 2024-04-23 LAB — BASIC METABOLIC PANEL WITH GFR
Anion gap: 11 (ref 5–15)
BUN: 16 mg/dL (ref 8–23)
CO2: 23 mmol/L (ref 22–32)
Calcium: 9.2 mg/dL (ref 8.9–10.3)
Chloride: 101 mmol/L (ref 98–111)
Creatinine, Ser: 0.9 mg/dL (ref 0.44–1.00)
GFR, Estimated: >60 mL/min (ref >60)
Glucose, Bld: 153 mg/dL — ABNORMAL HIGH (ref 70–99)
Potassium: 3.9 mmol/L (ref 3.5–5.1)
Sodium: 135 mmol/L (ref 135–145)

## 2024-04-23 LAB — TROPONIN I (HIGH SENSITIVITY)
Troponin I (High Sensitivity): 7 ng/L (ref <18)
Troponin I (High Sensitivity): 9 ng/L (ref <18)

## 2024-04-23 LAB — CBC
HCT: 40 % (ref 36.0–46.0)
Hemoglobin: 13.5 g/dL (ref 12.0–15.0)
MCH: 32.1 pg (ref 26.0–34.0)
MCHC: 33.8 g/dL (ref 30.0–36.0)
MCV: 95 fL (ref 80.0–100.0)
Platelets: 220 10*3/uL (ref 150–400)
RBC: 4.21 MIL/uL (ref 3.87–5.11)
RDW: 13.7 % (ref 11.5–15.5)
WBC: 6.5 10*3/uL (ref 4.0–10.5)
nRBC: 0 % (ref 0.0–0.2)

## 2024-04-23 LAB — MAGNESIUM: Magnesium: 2.1 mg/dL (ref 1.7–2.4)

## 2024-04-23 LAB — D-DIMER, QUANTITATIVE: D-Dimer, Quant: <0.27 ug{FEU}/mL (ref 0.00–0.50)

## 2024-04-23 MED ORDER — ETOMIDATE 2 MG/ML IV SOLN
0.1000 mg/kg | Freq: Once | INTRAVENOUS | Status: AC
  Administered 2024-04-23: 5.7 mg via INTRAVENOUS
  Filled 2024-04-23: qty 10

## 2024-04-23 MED ORDER — POTASSIUM CHLORIDE CRYS ER 20 MEQ PO TBCR
20.0000 meq | EXTENDED_RELEASE_TABLET | Freq: Once | ORAL | Status: AC
  Administered 2024-04-23: 20 meq via ORAL
  Filled 2024-04-23: qty 1

## 2024-04-23 MED ORDER — FENTANYL CITRATE PF 50 MCG/ML IJ SOSY
25.0000 ug | PREFILLED_SYRINGE | Freq: Once | INTRAMUSCULAR | Status: AC
  Administered 2024-04-23: 25 ug via INTRAVENOUS
  Filled 2024-04-23: qty 1

## 2024-04-23 MED ORDER — ONDANSETRON HCL 4 MG/2ML IJ SOLN
4.0000 mg | Freq: Once | INTRAMUSCULAR | Status: AC
  Administered 2024-04-23: 4 mg via INTRAVENOUS
  Filled 2024-04-23: qty 2

## 2024-04-23 NOTE — ED Notes (Signed)
 Patient up walking in the room. Then patient walked to toilet in room. Patient states she is seeing double and feels dizzy. But this is also considered normal for her sometimes. Patient states she sees double sometimes. She also states she gets dizzy from her neck pain. Patient AOX4 at this time.

## 2024-04-23 NOTE — Sedation Documentation (Signed)
cardiovert

## 2024-04-23 NOTE — ED Notes (Addendum)
 Consent signed for cardioversion on paper in medical records. Patient in patient gown. Pads placed.

## 2024-04-23 NOTE — ED Provider Notes (Addendum)
 [facility] Provider Note    Event Date/Time   First MD Initiated Contact with Patient 04/23/24 [phone]     (approximate)   History   Palpitations   HPI  [name] is a 83 year old female with history of HTN, SVT, sick sinus syndrome, CHF, A-fib presenting to the emergency department for evaluation of palpitations and lightheadedness.  On Sunday, patient had onset of palpitations consistent with prior episodes of A-fib.  She reports that these typically resolve within a few minutes and she is otherwise usually rhythm controlled in sinus rhythm.  However, she has had persistent symptoms since Sunday.  Had a brief episode of chest pain on Sunday that is now resolved.  Does report some shortness of breath, no syncope.  Has been compliant with her medication.  I reviewed her most recent cardiology visit with Dr. Gollan with cardiology from 01/01/2024.  At that time patient was noted to be euvolemic.  She was on Eliquis  and amiodarone  for her A-fib.  Metoprolol  was held for bradycardia.       Physical Exam   Triage Vital Signs: ED Triage Vitals  Encounter Vitals Group     BP 04/23/24 0917 134/81     Girls Systolic BP Percentile --      Girls Diastolic BP Percentile --      Boys Systolic BP Percentile --      Boys Diastolic BP Percentile --      Pulse Rate 04/23/24 0917 (!) 111     Resp 04/23/24 0917 17     Temp 04/23/24 0924 (!) 97.5 F (36.4 C)     Temp Source 04/23/24 0924 Oral     SpO2 04/23/24 0917 99 %     Weight 04/23/24 0918 125 lb 10.6 oz (57 kg)     Height 04/23/24 0918 5' 6 (1.676 m)     Head Circumference --      Peak Flow --      Pain Score 04/23/24 0917 0     Pain Loc --      Pain Education --      Exclude from Growth Chart --     Most recent vital signs: Vitals:   04/23/24 1245 04/23/24 1300  BP: (!) 132/51 (!) 140/47  Pulse: (!) 52 (!) 52  Resp: 11 18  Temp:    SpO2: 100% 99%     General: Awake, interactive   CV:  Tachycardic with irregularly irregular rhythm, heart rates ranging from 110s to 140s Resp:  Unlabored respirations, lungs clear to auscultation  Abd:  Nondistended, soft, nontender Neuro:  Symmetric facial movement, fluid speech   ED Results / Procedures / Treatments   Labs (all labs ordered are listed, but only abnormal results are displayed) Labs Reviewed  BASIC METABOLIC PANEL WITH GFR - Abnormal; Notable for the following components:      Result Value   Glucose, Bld 153 (*)    All other components within normal limits  CBC  PROTIME-INR  MAGNESIUM   D-DIMER, QUANTITATIVE  TROPONIN I (HIGH SENSITIVITY)  TROPONIN I (HIGH SENSITIVITY)     EKG EKG independently reviewed and interpreted by myself demonstrates:  EKG demonstrates wide-complex tachycardia secondary to left bundle branch block, irregular, interpreted by sinus tachycardia, but appears more consistent with A-fib on my review, repeat EKG obtained confirming this.  Rate 124, PR 106, QRS 169, QTc 513, no appreciable superimposed ischemic changes  RADIOLOGY Imaging independently reviewed and interpreted by myself demonstrates:  CXR  without focal consolidation  Formal Radiology Read:  DG Chest 2 View Result Date: 04/23/2024 CLINICAL DATA:  Chest pain. EXAM: CHEST - 2 VIEW COMPARISON:  04/05/2023. FINDINGS: Bilateral lung fields are clear. Bilateral costophrenic angles are clear. Stable cardio-mediastinal silhouette. No acute osseous abnormalities. Partially seen lower cervical spinal fixation hardware. The soft tissues are within normal limits. IMPRESSION: No active cardiopulmonary disease. Electronically Signed   By: Ree Molt M.D.   On: 04/23/2024 09:53    PROCEDURES:  Critical Care performed: Yes, see critical care procedure note(s)  CRITICAL CARE Performed by: Nilsa Dade   Total critical care time: 32 minutes  Critical care time was exclusive of separately billable procedures and treating other  patients.  Critical care was necessary to treat or prevent imminent or life-threatening deterioration.  Critical care was time spent personally by me on the following activities: development of treatment plan with patient and/or surrogate as well as nursing, discussions with consultants, evaluation of patient's response to treatment, examination of patient, obtaining history from patient or surrogate, ordering and performing treatments and interventions, ordering and review of laboratory studies, ordering and review of radiographic studies, pulse oximetry and re-evaluation of patient's condition.   .Cardioversion  Date/Time: 04/23/2024 12:44 PM  Performed by: Dade Nilsa, MD Authorized by: Dade Nilsa, MD   Consent:    Consent obtained:  Written   Consent given by:  Patient   Risks discussed:  Cutaneous burn, death, induced arrhythmia and pain   Alternatives discussed:  Rate-control medication, alternative treatment and observation Universal protocol:    Procedure explained and questions answered to patient or proxy's satisfaction: yes     Relevant documents present and verified: yes     Test results available and properly labeled: yes     Imaging studies available: yes     Immediately prior to procedure a time out was called: yes     Patient identity confirmed:  Verbally with patient Pre-procedure details:    Cardioversion basis:  Elective   Rhythm:  Atrial fibrillation   Electrode placement:  Anterior-posterior Patient sedated: Yes. Refer to sedation procedure documentation for details of sedation.  Attempt one:    Cardioversion mode:  Synchronous   Waveform:  Biphasic   Shock (Joules):  150   Shock outcome:  Conversion to normal sinus rhythm Post-procedure details:    Patient status:  Awake   Patient tolerance of procedure:  Tolerated well, no immediate complications .Sedation  Date/Time: 04/23/2024 12:47 PM  Performed by: Dade Nilsa, MD Authorized by: Dade Nilsa, MD   Consent:     Consent obtained:  Written   Consent given by:  Patient   Risks discussed:  Dysrhythmia, prolonged sedation necessitating reversal, prolonged hypoxia resulting in organ damage, inadequate sedation, respiratory compromise necessitating ventilatory assistance and intubation, nausea, vomiting and allergic reaction   Alternatives discussed:  Analgesia without sedation Universal protocol:    Immediately prior to procedure, a time out was called: yes   Indications:    Procedure performed:  Cardioversion Pre-sedation assessment:    Time since last food or drink:  0800   ASA classification: class 2 - patient with mild systemic disease     Mallampati score:  II - soft palate, uvula, fauces visible   Pre-sedation assessments completed and reviewed: airway patency, cardiovascular function, hydration status, mental status, nausea/vomiting, pain level, respiratory function and temperature   A pre-sedation assessment was completed prior to the start of the procedure Immediate pre-procedure details:    Reviewed: vital signs,  relevant labs/tests and NPO status     Verified: bag valve mask available, emergency equipment available, intubation equipment available, IV patency confirmed, oxygen available, reversal medications available and suction available   Procedure details (see MAR for exact dosages):    Preoxygenation:  Nasal cannula   Sedation:  Etomidate    Intended level of sedation: moderate (conscious sedation)   Analgesia:  Fentanyl    Intra-procedure monitoring:  Continuous capnometry, blood pressure monitoring, frequent LOC assessments, frequent vital sign checks, continuous pulse oximetry and cardiac monitor   Intra-procedure events: none     Total Provider sedation time (minutes):  15 Post-procedure details:   A post-sedation assessment was completed following the completion of the procedure.   Recovery: Patient returned to pre-procedure baseline     Patient is stable for discharge or  admission: yes     Procedure completion:  Tolerated well, no immediate complications    MEDICATIONS ORDERED IN ED: Medications  fentaNYL  (SUBLIMAZE ) injection 25 mcg (25 mcg Intravenous Given 04/23/24 1142)  etomidate  (AMIDATE ) injection 5.7 mg (5.7 mg Intravenous Given 04/23/24 1143)  potassium chloride  SA (KLOR-CON  M) CR tablet 20 mEq (20 mEq Oral Given 04/23/24 1236)  ondansetron  (ZOFRAN ) injection 4 mg (4 mg Intravenous Given 04/23/24 1235)     IMPRESSION / MDM / ASSESSMENT AND PLAN / ED COURSE  I reviewed the triage vital signs and the nursing notes.  Differential diagnosis includes, but is not limited to, symptomatic atrial fibrillation, ACS, PE, pneumonia, pneumothorax  Patient's presentation is most consistent with acute presentation with potential threat to life or bodily function.  83 year old female presenting with dizziness and palpitations found to be in A-fib with RVR.  I do note that patient is typically rhythm controlled on amiodarone .  She is adamant that she has been compliant with her Eliquis .  In the setting of this, I did discuss the case with Dr. Darliss with [facility] cardiology.  He did recommend cardioversion for this patient with potential discharge and clinic follow-up if successful.  I did discuss this plan with the patient.  She understands risk of cardioversion.  Cardioversion was performed as above with successful conversion to sinus rhythm.  Afterwards, she did return to her baseline mental status.  She was able to ambulate in the ER with a steady gait.  Nurse did report some dizziness as well as double vision.  I did go discuss this with the patient.  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  She additionally reports the dizziness she was experiencing is not atypical for her.  She does not feel this is significantly worsened from her baseline.  I did discuss additional observation,  consideration for further testing.  Patient says she feels much improved and would prefer to be discharged home.  Given return to baseline, chronic nature of reported symptoms, do think discharge is reasonable.  Will place urgent referral to cardiology for further evaluation.  Will have patient continue her amiodarone  for now.  Strict return precautions provided.  Patient discharged in stable condition.    FINAL CLINICAL IMPRESSION(S) / ED DIAGNOSES   Final diagnoses:  Atrial fibrillation with RVR (HCC)     Rx / DC Orders   ED Discharge Orders          Ordered    Ambulatory referral to Cardiology       Comments: If you have not heard from the Cardiology office within the next 72  hours please call [phone].   04/23/24 1346             Note:  This document was prepared using Dragon voice recognition software and may include unintentional dictation errors.   Levander Slate, MD 04/23/24 1346    Levander Slate, MD 05/13/24 [phone]

## 2024-04-23 NOTE — ED Triage Notes (Signed)
 Patient to ED via POV for palpitations since Sunday. Pt states intermittent CP since then- no CP currently. Hx afib, CHF. C/o of some dizziness and SOB.

## 2024-04-23 NOTE — Discharge Instructions (Signed)
 You were seen in the emergency department today for your palpitations and dizziness.  I suspect this was related to going into an abnormal heart rhythm called A-fib.  We were able to successfully cardiovert you back to sinus rhythm.  Arrange follow-up with your cardiologist for further evaluation.  Return to the ER for new or worsening symptoms.

## 2024-04-29 NOTE — Progress Notes (Unsigned)
 Cardiology Office Note   Date:  04/30/2024  ID:  [name], DOB 03-13-1941, MRN [id number] PCP: Glenard Mire, MD  [facility] Providers Cardiologist:  None     History of Present Illness [name] is a 83 y.o. 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   Zio patch in 01/2018 showed predominant rhythm was sinus with an average rate of 66 bpm, bundle branch block/IVCD was present, 45 runs SVT were noted with the fastest interval lasting 13 beats and the longest interval lasting 14.5 seconds, some instances besides of SVT may have been short bursts of atrial fibrillation/flutter, A-fib occurred with a 2% burden of with the longest episode lasting 4 hours and 24 minutes, secondary AV block type I was present.  She was found to have frequent PVCs and fusion beats with subsequent event monitoring showing runs of SVT as well as runs of A-fib, she was placed on Xarelto .  She was seen back in clinic in 02/2018 and noted to be in A-fib with RVR despite beta-blocker and calcium  channel blocker therapy.  In the setting she was placed on amiodarone  with plans for outpatient DCCV.  However she was admitted with CHF exacerbation in the context of A-fib with RVR in 03/2018.  She was cardioverted at that time.  Recurrent A-fib and heart failure in 04/2018 required repeat amiodarone  and DCCV.  She was admitted to the hospital 10/2020 with acute respiratory failure hypoxia in the context of multifocal pneumonia treated with antibiotics, steroids, and nebulizers.  Echocardiogram revealed LVEF of 60-65%, no RWMA, G1 DD, normal RV systolic function and  ventricular cavity size, and mild to moderate mitral valve regurgitation.  She was admitted to the hospital 07/2021 with near syncope and fall/hypotension.  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  Cardiac enzymes are negative.  BNP 145.  Carotid artery ultrasound showed no hemodynamically significant stenosis bilaterally.  Echocardiogram showed an LVEF of 55 to 60%, no RWMA, moderate LVH, G1 DD, normal LV systolic function and ventricular cavity size, normal PASP, mild mitral regurgitation, mild to moderate tricuspid regurgitation, mild aortic valve insufficiency mild to moderate aortic valve sclerosis without evidence of stenosis.  She was evaluated by EP with symptoms going to be related to known orthostatic intolerance and exacerbated by pain.  Given this was not abrupt onset and offset it was felt to be unlikely contributing with recommendations for loop recorder if symptoms recur.  EP recommended discontinuation of metoprolol .  At the time of discharge primary service held amiodarone   She was seen in follow-up 08/03/2021 is doing well from a cardiac perspective.  She denied having further presyncopal episodes.  Amiodarone  was restarted in an effort to maintain sinus rhythm.  It was recommended that she continue to hold metoprolol  given prior bradycardia and first-degree AV block.  She was seen in clinic 09/2021 doing well from a cardiac perspective.  No near syncopal episodes.  Blood pressure log shows readings typically around 130s to 150s.  She does note some lightheadedness with her blood pressure gets around 115 systolic.  She has tolerated low-dose amlodipine  2.5 mg daily without issues and remained on amiodarone  and apixaban .  It was recommended  she follow back up with EP for consideration of loop recorder, she was sent for updated labs.  She was scheduled for TEE to further evaluate her mitral regurgitation.  Continued on GDMT as tolerated.  She underwent TEE 10/19/2021  which revealed mitral regurgitation was mild, no ASD or VSD, no PFO, the LA was well-visualized no spontaneous contrast and no thrombus in the LAA or LA appendage.  The descending thoracic aorta had no mural aortic debris with no evidence of aneurysmal dilatation or dissection.  She was evaluated in clinic 10/27/2021.  She had previously stopped her blood pressure medications due to blood pressure dropping low.  Amlodipine  was increased to 5 mg daily and she was continuing to check her blood pressures at home.  She was also continued on apixaban  and considering referral to EP for loop recorder.   She was last seen in clinic 01/01/2024 by Dr. Gollan.  At that point in time she was doing well.  Blood pressure was slightly up today during visit.  Stated blood pressure often drop in the morning before she is taken her medications sometimes feels dizzy.  She Was continued on low-dose furosemide  was continued on increasing her fluid intake.  Continued on Eliquis  and amiodarone .  No medication changes were made and further testing was ordered.  She was evaluated in the [facility] emergency department 04/23/2024 with complaint of palpitations.  She reports they typically resolve within a few minutes and she is otherwise usually with sinus rhythm.  However she had persistent symptoms since Sunday.  Had a brief episode of chest pain on Sunday that had now resolved.  Reported some shortness of breath but no syncope.  States has been compliant with her current medication regimen.  Vital signs showed a blood pressure of 134/81, pulse of 111, respirations of 17, temperature 97.5.  EKG revealed atrial fibrillation.  Chest x-ray showed no acute cardiopulmonary disease.  Cardioversion was performed with successful conversion to sinus rhythm.  Afterwards she did return to her baseline mental status.  She was able to ambulate in the ER with steady gait.  She continued to have some dizziness and double vision.  She was continued on  amiodarone  and apixaban  and scheduled for close follow-up with cardiology.  She returns to clinic today accompanied by her husband.  She states she has been feeling well since her discharge from the emergency department after a cardioversion procedure.  She states she does not have any further breakthrough atrial fibrillation.  She has been continued on her current medication regimens.  She has not missed a dose of her apixaban  and denies any bleeding with no blood in urine or stool.  She is also continued on amiodarone .  Lab work was considered stable in the emergency department.  She has not had any issues with recurrent dizziness or visual disturbance since her cardioversion.  She denies any chest pain, shortness of breath, or peripheral edema.  ROS: Point review of systems has been reviewed and considered negative the exception was been listed in the HPI  Studies Reviewed EKG Interpretation Date/Time:  Wednesday April 30 2024 09:57:33 EDT Ventricular Rate:  64 PR Interval:  222 QRS Duration:  162 QT Interval:  454 QTC Calculation: 468 R Axis:   -54  Text Interpretation: Sinus rhythm with 1st degree A-V block Left axis deviation Left bundle branch block When compared with ECG of 23-Apr-2024 11:45, PREVIOUS ECG IS PRESENT No significant change since last tracing Confirmed by Gerard Frederick ([id number]) on 04/30/2024 10:09:17  AM    2D echo 07/24/2021: 1. Left ventricular ejection fraction, by estimation, is 55 to 60%. The  left ventricle has normal function. The left ventricle has no regional  wall motion abnormalities. There is moderate left ventricular hypertrophy.  Left ventricular diastolic  parameters are consistent with Grade I diastolic dysfunction (impaired  relaxation).   2. Right ventricular systolic function is normal. The right ventricular  size is normal. There is normal pulmonary artery systolic pressure.   3. The mitral valve is abnormal. Mild mitral valve regurgitation. No   evidence of mitral stenosis.   4. Tricuspid valve regurgitation is mild to moderate.   5. The aortic valve is tricuspid. There is mild calcification of the  aortic valve. There is mild thickening of the aortic valve. Aortic valve  regurgitation is mild. Mild to moderate aortic valve  sclerosis/calcification is present, without any evidence  of aortic stenosis.   6. The inferior vena cava is normal in size with greater than 50%  respiratory variability, suggesting right atrial pressure of 3 mmHg.   2D echo 10/31/2020: 1. Left ventricular ejection fraction, by estimation, is 60 to 65%. The  left ventricle has normal function. The left ventricle has no regional  wall motion abnormalities. Left ventricular diastolic parameters are  consistent with Grade I diastolic  dysfunction (impaired relaxation).   2. Right ventricular systolic function is normal. The right ventricular  size is normal. There is moderately elevated pulmonary artery systolic  pressure. The estimated right ventricular systolic pressure is 54.8 mmHg.   3. The mitral valve is normal in structure. Mild to moderate mitral valve  regurgitation. Two jets. No evidence of mitral stenosis.   2D echo 03/21/2018: - Left ventricle: The cavity size was normal. Wall thickness was    increased in a pattern of mild LVH. Systolic function was mildly    reduced. The estimated ejection fraction was in the range of 45%    to 50%. Diffuse hypokinesis.  - Aortic valve: There was trivial regurgitation.  - Mitral valve: Mildly calcified annulus. There was moderate    regurgitation directed posteriorly.  - Left atrium: The atrium was mildly dilated.  - Right ventricle: The cavity size was normal. Systolic function    was normal.  - Right atrium: The atrium was mildly dilated.  - Tricuspid valve: There was moderate regurgitation.  - Pulmonary arteries: Systolic pressure was mildly to moderately    increased, in the range of 40 mm Hg to 45 mm  Hg.  - Inferior vena cava: The vessel was normal in size. The    respirophasic diameter changes were blunted (< 50%), consistent    with elevated central venous pressure.  - Pericardium, extracardiac: A trivial pericardial effusion was    identified.    Zio patch 01/2018: Normal sinus rhythm min HR of 30 bpm, max HR of 179 bpm, and avg HR of 66 bpm.   Bundle Branch Block/IVCD was present.   45 Supraventricular Tachycardia runs occurred, the run with the fastest interval lasting 13 beats with a max rate of 179 bpm, the longest lasting 14.5 secs with an avg rate of 96 bpm.    Some Episodes of Supraventricula Tachycardia May be short burst of Atrial fibrillation/Flutter.    Atrial Fibrillation occurred (2% burden), ranging from 56-129 bpm (avg of 79 bpm), the longest lasting 4 hours 24 mins with an avg rate of 79 bpm.    Second Degree AV Block-Mobitz I (Wenckebach) was present.  Atrial Fibrillation was detected within +/- 45 seconds of symptomatic patient event(s).   Other patient triggers with no arrhythmia noted   Isolated SVEs were rare (<1.0%), and no SVE Couplets or SVE Triplets were present. Isolated VEs were frequent (7.9%, 81203), VE Couplets were rare (<1.0%, 1332), and VE Triplets were rare (<1.0%, 71). Ventricular Bigeminy and Trigeminy were Present.   Lexiscan MPI 02/01/2008: 1.     Fair exercise tolerance with no arrhythmia or ischemia.  2.     Normal LV function at 76% with no transient LV dilatation or  regional  wall motion abnormality.  3.     Fixed septal defect consistent with LEFT bundle branch block. No  reversible ischemia noted.  Risk Assessment/Calculations  CHA2DS2-VASc Score = 6   This indicates a 9.7% annual risk of stroke. The patient's score is based upon: CHF History: 1 HTN History: 1 Diabetes History: 0 Stroke History: 0 Vascular Disease History: 1 Age Score: 2 Gender Score: 1    HYPERTENSION CONTROL Vitals:   04/30/24 0952 04/30/24 1005   BP: (!) 154/62 (!) 150/60    The patient's blood pressure is elevated above target today.  In order to address the patient's elevated BP: Blood pressure will be monitored at home to determine if medication changes need to be made.; The blood pressure is usually elevated in clinic.  Blood pressures monitored at home have been optimal.          Physical Exam VS:  BP (!) 150/60 (BP Location: Left Arm, Patient Position: Sitting, Cuff Size: Normal)   Pulse 64   Ht 5' 6.5 (1.689 m)   Wt 127 lb 6.4 oz (57.8 kg)   SpO2 98%   BMI 20.25 kg/m        Wt Readings from Last 3 Encounters:  04/30/24 127 lb 6.4 oz (57.8 kg)  04/23/24 125 lb 10.6 oz (57 kg)  01/10/24 126 lb 9.6 oz (57.4 kg)    GEN: Well nourished, well developed in no acute distress NECK: No JVD; Left sided carotid bruit without noted bruit on the right CARDIAC: RRR, II/VI blowing holosystolic murmur, rubs, gallops RESPIRATORY:  Clear to auscultation without rales, wheezing or rhonchi  ABDOMEN: Soft, non-tender, non-distended EXTREMITIES:  No edema; No deformity   ASSESSMENT AND PLAN Paroxysmal atrial fibrillation with recent cardioversion completed in the emergency department 04/23/2024.  Patient is maintaining sinus rhythm since cardioversion procedure.  EKG today reveals sinus rhythm 65 with first-degree AV block and chronic left bundle branch block.  No ischemic changes noted.  She is continued on amiodarone  200 mg daily, apixaban  2.5 mg twice daily as she does meet reduced dosing criteria (age and weight), denies any bleeding issues.  Chronic combined diastolic CHF with last echo showing normal LVEF and mild MR on TEE 2022.  She appears to be euvolemic on exam.  Suffers from NYHA class II symptoms.  She has not been continued on furosemide  20 mg daily.  She is not on beta-blocker therapy due to bradycardia.  History of orthostasis precluding GDMT escalation.  Mild mitral regurgitation noted on TEE.  Will need updated  echocardiogram scheduled on return.  Primary hypertension with blood pressure today 154/62 and a repeat of 150/60.  Patient with longstanding history of whitecoat syndrome.  Blood pressures at home are 120s to 130s.  After medications.  She is continued on amlodipine  5 mg daily, doxazosin  1 mg daily.  She has been encouraged to continue to monitor her blood pressures 1 to  2 hours postmedication administration as well.  Peripheral arterial disease with a carotid bruit noted on the left.  Ultrasound 06/2018 with no stenosis in 2023 revealed minimal disease.  Mixed hyperlipidemia with last LDL being 51.  She has continued on rosuvastatin  40 mg daily.  Ongoing management per PCP.  CKD with last serum creatinine that improved from 0.90.  GFR greater than 60.  Encouraged to maintain adequate hydration and to avoid NSAIDs.  History of SVT with prior ablation continued on amiodarone .  Left bundle branch block is stable and is not noted on EKG today.  She also continues to first-degree AV block and previously resolved of beta-blocker therapy.  Will continue to monitor with surveillance studies.       Dispo: Patient to return to clinic to see MD/APP in 3 months or sooner if needed for evaluation of symptoms.  Signed, Tashena Ibach, NP

## 2024-04-30 ENCOUNTER — Encounter: Payer: Self-pay | Admitting: Cardiology

## 2024-04-30 ENCOUNTER — Ambulatory Visit: Attending: Cardiology | Admitting: Cardiology

## 2024-04-30 VITALS — BP 150/60 | HR 64 | Ht 66.5 in | Wt 127.4 lb

## 2024-04-30 DIAGNOSIS — I471 Supraventricular tachycardia, unspecified: Secondary | ICD-10-CM

## 2024-04-30 DIAGNOSIS — I480 Paroxysmal atrial fibrillation: Secondary | ICD-10-CM

## 2024-04-30 DIAGNOSIS — I5042 Chronic combined systolic (congestive) and diastolic (congestive) heart failure: Secondary | ICD-10-CM

## 2024-04-30 DIAGNOSIS — I340 Nonrheumatic mitral (valve) insufficiency: Secondary | ICD-10-CM | POA: Diagnosis not present

## 2024-04-30 DIAGNOSIS — E782 Mixed hyperlipidemia: Secondary | ICD-10-CM

## 2024-04-30 DIAGNOSIS — I447 Left bundle-branch block, unspecified: Secondary | ICD-10-CM

## 2024-04-30 DIAGNOSIS — I10 Essential (primary) hypertension: Secondary | ICD-10-CM

## 2024-04-30 NOTE — Patient Instructions (Signed)
 Medication Instructions:  Your physician recommends that you continue on your current medications as directed. Please refer to the Current Medication list given to you today.   *If you need a refill on your cardiac medications before your next appointment, please call your pharmacy*  Lab Work: No labs ordered today  If you have labs (blood work) drawn today and your tests are completely normal, you will receive your results only by: MyChart Message (if you have MyChart) OR A paper copy in the mail If you have any lab test that is abnormal or we need to change your treatment, we will call you to review the results.  Testing/Procedures: No test ordered today   Follow-Up: At [facility], you and your health needs are our priority.  As part of our continuing mission to provide you with exceptional heart care, our providers are all part of one team.  This team includes your primary Cardiologist (physician) and Advanced Practice Providers or APPs (Physician Assistants and Nurse Practitioners) who all work together to provide you with the care you need, when you need it.  Your next appointment:   3 month(s)  Provider:   Timothy Gollan, MD or Ronald Cockayne, NP

## 2024-05-03 ENCOUNTER — Other Ambulatory Visit: Payer: Self-pay

## 2024-05-03 ENCOUNTER — Inpatient Hospital Stay
Admission: EM | Admit: 2024-05-03 | Discharge: 2024-05-06 | DRG: 309 | Disposition: A | Discharge status: Home / Self Care | Attending: Internal Medicine | Admitting: Internal Medicine

## 2024-05-03 ENCOUNTER — Inpatient Hospital Stay (HOSPITAL_COMMUNITY): Admit: 2024-05-03 | Discharge: 2024-05-03 | Disposition: A | Discharge status: Home / Self Care | Attending: Cardiology

## 2024-05-03 ENCOUNTER — Emergency Department

## 2024-05-03 DIAGNOSIS — I4891 Unspecified atrial fibrillation: Secondary | ICD-10-CM

## 2024-05-03 DIAGNOSIS — I447 Left bundle-branch block, unspecified: Secondary | ICD-10-CM | POA: Diagnosis present

## 2024-05-03 DIAGNOSIS — Z85820 Personal history of malignant melanoma of skin: Secondary | ICD-10-CM

## 2024-05-03 DIAGNOSIS — Z79899 Other long term (current) drug therapy: Secondary | ICD-10-CM

## 2024-05-03 DIAGNOSIS — Z91018 Allergy to other foods: Secondary | ICD-10-CM

## 2024-05-03 DIAGNOSIS — Z8249 Family history of ischemic heart disease and other diseases of the circulatory system: Secondary | ICD-10-CM | POA: Diagnosis not present

## 2024-05-03 DIAGNOSIS — Z7901 Long term (current) use of anticoagulants: Secondary | ICD-10-CM | POA: Diagnosis not present

## 2024-05-03 DIAGNOSIS — I10 Essential (primary) hypertension: Secondary | ICD-10-CM | POA: Diagnosis present

## 2024-05-03 DIAGNOSIS — E039 Hypothyroidism, unspecified: Secondary | ICD-10-CM | POA: Diagnosis present

## 2024-05-03 DIAGNOSIS — M1990 Unspecified osteoarthritis, unspecified site: Secondary | ICD-10-CM | POA: Diagnosis present

## 2024-05-03 DIAGNOSIS — Z8051 Family history of malignant neoplasm of kidney: Secondary | ICD-10-CM

## 2024-05-03 DIAGNOSIS — M47816 Spondylosis without myelopathy or radiculopathy, lumbar region: Secondary | ICD-10-CM | POA: Diagnosis present

## 2024-05-03 DIAGNOSIS — I5032 Chronic diastolic (congestive) heart failure: Secondary | ICD-10-CM

## 2024-05-03 DIAGNOSIS — I495 Sick sinus syndrome: Secondary | ICD-10-CM | POA: Diagnosis present

## 2024-05-03 DIAGNOSIS — I482 Chronic atrial fibrillation, unspecified: Secondary | ICD-10-CM

## 2024-05-03 DIAGNOSIS — I4819 Other persistent atrial fibrillation: Secondary | ICD-10-CM | POA: Diagnosis present

## 2024-05-03 DIAGNOSIS — Z79890 Hormone replacement therapy (postmenopausal): Secondary | ICD-10-CM | POA: Diagnosis not present

## 2024-05-03 DIAGNOSIS — I4719 Other supraventricular tachycardia: Secondary | ICD-10-CM | POA: Diagnosis present

## 2024-05-03 DIAGNOSIS — M47812 Spondylosis without myelopathy or radiculopathy, cervical region: Secondary | ICD-10-CM | POA: Diagnosis present

## 2024-05-03 DIAGNOSIS — Z880 Allergy status to penicillin: Secondary | ICD-10-CM | POA: Diagnosis not present

## 2024-05-03 DIAGNOSIS — Z803 Family history of malignant neoplasm of breast: Secondary | ICD-10-CM | POA: Diagnosis not present

## 2024-05-03 DIAGNOSIS — I130 Hypertensive heart and chronic kidney disease with heart failure and stage 1 through stage 4 chronic kidney disease, or unspecified chronic kidney disease: Secondary | ICD-10-CM | POA: Diagnosis present

## 2024-05-03 DIAGNOSIS — N182 Chronic kidney disease, stage 2 (mild): Secondary | ICD-10-CM | POA: Diagnosis present

## 2024-05-03 DIAGNOSIS — Z820 Family history of epilepsy and other diseases of the nervous system: Secondary | ICD-10-CM | POA: Diagnosis not present

## 2024-05-03 DIAGNOSIS — E785 Hyperlipidemia, unspecified: Secondary | ICD-10-CM | POA: Diagnosis present

## 2024-05-03 DIAGNOSIS — Z823 Family history of stroke: Secondary | ICD-10-CM | POA: Diagnosis not present

## 2024-05-03 DIAGNOSIS — Z8041 Family history of malignant neoplasm of ovary: Secondary | ICD-10-CM

## 2024-05-03 DIAGNOSIS — R001 Bradycardia, unspecified: Secondary | ICD-10-CM | POA: Diagnosis not present

## 2024-05-03 DIAGNOSIS — Z882 Allergy status to sulfonamides status: Secondary | ICD-10-CM

## 2024-05-03 DIAGNOSIS — I5042 Chronic combined systolic (congestive) and diastolic (congestive) heart failure: Secondary | ICD-10-CM | POA: Diagnosis present

## 2024-05-03 DIAGNOSIS — Z885 Allergy status to narcotic agent status: Secondary | ICD-10-CM

## 2024-05-03 DIAGNOSIS — Z881 Allergy status to other antibiotic agents status: Secondary | ICD-10-CM

## 2024-05-03 DIAGNOSIS — R0602 Shortness of breath: Secondary | ICD-10-CM | POA: Diagnosis present

## 2024-05-03 DIAGNOSIS — I480 Paroxysmal atrial fibrillation: Secondary | ICD-10-CM | POA: Diagnosis present

## 2024-05-03 HISTORY — DX: Malignant (primary) neoplasm, unspecified: C80.1

## 2024-05-03 LAB — MAGNESIUM: Magnesium: 2.1 mg/dL (ref 1.7–2.4)

## 2024-05-03 LAB — CBC
HCT: 40.9 % (ref 36.0–46.0)
Hemoglobin: 13.9 g/dL (ref 12.0–15.0)
MCH: 32.3 pg (ref 26.0–34.0)
MCHC: 34 g/dL (ref 30.0–36.0)
MCV: 94.9 fL (ref 80.0–100.0)
Platelets: 240 K/uL (ref 150–400)
RBC: 4.31 MIL/uL (ref 3.87–5.11)
RDW: 13.5 % (ref 11.5–15.5)
WBC: 8.5 K/uL (ref 4.0–10.5)
nRBC: 0 % (ref 0.0–0.2)

## 2024-05-03 LAB — TROPONIN I (HIGH SENSITIVITY)
Troponin I (High Sensitivity): 8 ng/L (ref <18)
Troponin I (High Sensitivity): 10 ng/L (ref <18)

## 2024-05-03 LAB — BASIC METABOLIC PANEL WITH GFR
Anion gap: 8 (ref 5–15)
BUN: 15 mg/dL (ref 8–23)
CO2: 27 mmol/L (ref 22–32)
Calcium: 9.3 mg/dL (ref 8.9–10.3)
Chloride: 101 mmol/L (ref 98–111)
Creatinine, Ser: 0.81 mg/dL (ref 0.44–1.00)
GFR, Estimated: >60 mL/min (ref >60)
Glucose, Bld: 169 mg/dL — ABNORMAL HIGH (ref 70–99)
Potassium: 4 mmol/L (ref 3.5–5.1)
Sodium: 136 mmol/L (ref 135–145)

## 2024-05-03 LAB — TSH: TSH: 1.579 u[IU]/mL (ref 0.350–4.500)

## 2024-05-03 LAB — PHOSPHORUS: Phosphorus: 2.9 mg/dL (ref 2.5–4.6)

## 2024-05-03 MED ORDER — MELATONIN 5 MG PO TABS
10.0000 mg | ORAL_TABLET | Freq: Every evening | ORAL | Status: DC | PRN
  Administered 2024-05-05: 10 mg via ORAL
  Filled 2024-05-03 (×2): qty 2

## 2024-05-03 MED ORDER — OXYCODONE HCL 5 MG PO TABS
5.0000 mg | ORAL_TABLET | Freq: Four times a day (QID) | ORAL | Status: DC | PRN
  Administered 2024-05-03 – 2024-05-06 (×9): 5 mg via ORAL
  Filled 2024-05-03 (×9): qty 1

## 2024-05-03 MED ORDER — LEVOTHYROXINE SODIUM 137 MCG PO TABS
137.0000 ug | ORAL_TABLET | Freq: Every day | ORAL | Status: DC
  Administered 2024-05-04 – 2024-05-06 (×3): 137 ug via ORAL
  Filled 2024-05-03 (×3): qty 1

## 2024-05-03 MED ORDER — AMLODIPINE BESYLATE 5 MG PO TABS
5.0000 mg | ORAL_TABLET | Freq: Every day | ORAL | Status: DC
  Administered 2024-05-04 – 2024-05-06 (×3): 5 mg via ORAL
  Filled 2024-05-03 (×3): qty 1

## 2024-05-03 MED ORDER — AMIODARONE LOAD VIA INFUSION
150.0000 mg | Freq: Once | INTRAVENOUS | Status: AC
  Administered 2024-05-03: 150 mg via INTRAVENOUS
  Filled 2024-05-03: qty 83.34

## 2024-05-03 MED ORDER — FUROSEMIDE 20 MG PO TABS
20.0000 mg | ORAL_TABLET | Freq: Every day | ORAL | Status: DC
  Administered 2024-05-03 – 2024-05-06 (×4): 20 mg via ORAL
  Filled 2024-05-03 (×4): qty 1

## 2024-05-03 MED ORDER — ONDANSETRON HCL 4 MG PO TABS
4.0000 mg | ORAL_TABLET | Freq: Four times a day (QID) | ORAL | Status: DC | PRN
  Administered 2024-05-04 – 2024-05-05 (×2): 4 mg via ORAL
  Filled 2024-05-03 (×2): qty 1

## 2024-05-03 MED ORDER — AMIODARONE HCL IN DEXTROSE 360-4.14 MG/200ML-% IV SOLN
30.0000 mg/h | INTRAVENOUS | Status: DC
  Administered 2024-05-04 – 2024-05-06 (×5): 30 mg/h via INTRAVENOUS
  Filled 2024-05-03 (×5): qty 200

## 2024-05-03 MED ORDER — POTASSIUM CHLORIDE CRYS ER 10 MEQ PO TBCR
10.0000 meq | EXTENDED_RELEASE_TABLET | Freq: Once | ORAL | Status: AC
  Administered 2024-05-03: 10 meq via ORAL
  Filled 2024-05-03: qty 1

## 2024-05-03 MED ORDER — APIXABAN 2.5 MG PO TABS
2.5000 mg | ORAL_TABLET | Freq: Two times a day (BID) | ORAL | Status: DC
Start: 2024-05-03 — End: 2024-05-06
  Administered 2024-05-03 – 2024-05-06 (×6): 2.5 mg via ORAL
  Filled 2024-05-03 (×7): qty 1

## 2024-05-03 MED ORDER — ROSUVASTATIN CALCIUM 10 MG PO TABS
40.0000 mg | ORAL_TABLET | Freq: Every day | ORAL | Status: DC
  Administered 2024-05-03 – 2024-05-05 (×3): 40 mg via ORAL
  Filled 2024-05-03: qty 4
  Filled 2024-05-03 (×3): qty 2

## 2024-05-03 MED ORDER — AMIODARONE HCL IN DEXTROSE 360-4.14 MG/200ML-% IV SOLN
60.0000 mg/h | INTRAVENOUS | Status: DC
  Administered 2024-05-03 (×2): 60 mg/h via INTRAVENOUS
  Filled 2024-05-03 (×2): qty 200

## 2024-05-03 MED ORDER — ACETAMINOPHEN 325 MG PO TABS
650.0000 mg | ORAL_TABLET | Freq: Three times a day (TID) | ORAL | Status: DC | PRN
  Administered 2024-05-03: 650 mg via ORAL
  Filled 2024-05-03: qty 2

## 2024-05-03 MED ORDER — ONDANSETRON HCL 4 MG/2ML IJ SOLN: 4.0000 mg | Freq: Four times a day (QID) | INTRAMUSCULAR | Status: DC | PRN

## 2024-05-03 MED ORDER — DOXAZOSIN MESYLATE 1 MG PO TABS
1.0000 mg | ORAL_TABLET | Freq: Every day | ORAL | Status: DC
  Administered 2024-05-03 – 2024-05-05 (×3): 1 mg via ORAL
  Filled 2024-05-03 (×6): qty 1

## 2024-05-03 MED ORDER — BUSPIRONE HCL 10 MG PO TABS
5.0000 mg | ORAL_TABLET | Freq: Three times a day (TID) | ORAL | Status: DC
  Administered 2024-05-03 – 2024-05-06 (×9): 5 mg via ORAL
  Filled 2024-05-03 (×9): qty 1

## 2024-05-03 MED ORDER — TEMAZEPAM 15 MG PO CAPS
15.0000 mg | ORAL_CAPSULE | Freq: Every day | ORAL | Status: DC
Start: 2024-05-03 — End: 2024-05-06
  Administered 2024-05-03 – 2024-05-05 (×3): 15 mg via ORAL
  Filled 2024-05-03 (×4): qty 1

## 2024-05-03 MED ORDER — LORATADINE 10 MG PO TABS: 10.0000 mg | ORAL_TABLET | Freq: Every day | ORAL | Status: DC | PRN

## 2024-05-03 NOTE — ED Provider Notes (Signed)
 [facility] Provider Note    Event Date/Time   First MD Initiated Contact with Patient 05/03/24 1128     (approximate)   History   Palpitations   HPI  RIONA LAHTI is a 83 y.o. female with a history of AV nodal reentrant tachycardia, previous ablation, paroxysmal A-fib and chronic left bundle branch block hypertension hyperlipidemia   Patient currently on amiodarone  and apixaban   Physical Exam   Triage Vital Signs: ED Triage Vitals  Encounter Vitals Group     BP 05/03/24 1056 (!) 153/103     Girls Systolic BP Percentile --      Girls Diastolic BP Percentile --      Boys Systolic BP Percentile --      Boys Diastolic BP Percentile --      Pulse Rate 05/03/24 1056 (!) 124     Resp 05/03/24 1056 18     Temp 05/03/24 1056 98 F (36.7 C)     Temp Source 05/03/24 1056 Oral     SpO2 05/03/24 1056 98 %     Weight 05/03/24 1057 127 lb 13.9 oz (58 kg)     Height 05/03/24 1057 5' 6 (1.676 m)     Head Circumference --      Peak Flow --      Pain Score 05/03/24 1051 4     Pain Loc --      Pain Education --      Exclude from Growth Chart --     Most recent vital signs: Vitals:   05/03/24 1125 05/03/24 1130  BP:  (!) 154/81  Pulse:  (!) 119  Resp:  20  Temp:    SpO2: 100% 100%     General: Awake, no distress.  CV:  Good peripheral perfusion.  Moderate tachycardia with irregularity.  No discernible murmur Resp:  Normal effort.  Clear bilateral Abd:  No distention.  Other:  No lower extremity edema appreciable.  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  There is irregularity, it is less than a rate of 150, and I do not believe this represents ventricular tachycardia.  Suspect based on clinical previous history of left bundle branch block and irregularity this represents left bundle branch block with A-fib   RADIOLOGY  Chest x-ray inter by me is clear lung fields, borderline cardiomegaly    DG Chest 2 View Result Date: 05/03/2024 CLINICAL DATA:  Palpitations and chest pressure. EXAM: CHEST - 2 VIEW COMPARISON:  04/23/2024. FINDINGS: The heart is enlarged the mediastinal contour stable. There is atherosclerotic calcification of the aorta. No consolidation, effusion, or pneumothorax is seen. No acute osseous abnormality. IMPRESSION: No active cardiopulmonary disease. Electronically Signed   By: Leita Birmingham M.D.   On: 05/03/2024 11:45      PROCEDURES:  Critical Care performed: Yes,  see critical care procedure note(s)  Procedures   MEDICATIONS ORDERED IN ED: Medications  amiodarone  (NEXTERONE ) 1.8 mg/mL load via infusion 150 mg (has no administration in time range)    Followed by  amiodarone  (NEXTERONE  PREMIX) 360-4.14 MG/200ML-% (1.8 mg/mL) IV infusion (has no administration in time range)    Followed by  amiodarone  (NEXTERONE  PREMIX) 360-4.14 MG/200ML-% (1.8 mg/mL) IV infusion (has no administration in time range)     IMPRESSION / MDM / ASSESSMENT AND PLAN / ED COURSE  I reviewed the triage vital signs and the nursing notes.                              Differential diagnosis includes, but is not limited to, paroxysmal A-fib, recurrent atrial fibrillation on anticoagulation, electrolyte abnormality, cardiac ischemia, pulmonary cause secondary cause etc. considered.  Patient  has recently had workup follow-up evaluation with cardiology.  Appears to have recurrent atrial fibrillation with rapid ventricular response despite use of home amiodarone  and anticoagulation which she reports consistent use of for greater than 2 weeks  Patient's presentation is most consistent with acute complicated illness / injury requiring diagnostic workup.   The patient is on the cardiac monitor to evaluate for evidence of arrhythmia and/or significant heart rate changes.  Reassessment during that no associated chest pain status changes or dyspnea.  Does relate a feeling of some dizziness or lightheadedness associate with her palpitations which she reports is consistent with previous  Clinical Course as of 05/03/24 1245  Sat May 03, 2024  1240 Dr. Budd at bedside.  I did discuss with the patient the option to consider another cardioversion procedure, patient reports that she does not feel like she would wish to undergo that again at least not this soon having just had it completed.  I am in agreement and also discussed with Dr. Lanetta that given she was just recently cardioverted in the ED, I did not see a particular benefit to reattempting DC cardioversion in such source proximity, especially with her relatively recent recurrence of Afib from less than 2 weeks ago.  Cardiology providing consult, had considered DC cardioversion however given that she just had cardioversion think it would also seem a reasonable approach to attempt medical therapy as well.  Amiodarone  initiated at recommendation by cardiology [MQ]  1244 Patient accepted to hospitalist service by Dr. Laurita [MQ]    Clinical Course User Index [MQ] Dicky Anes, MD   CBC metabolic panel troponin reassuring  FINAL CLINICAL IMPRESSION(S) / ED DIAGNOSES   Final diagnoses:  Atrial fibrillation with RVR (HCC)     Rx / DC Orders   ED Discharge Orders     None        Note:  This document was prepared using Dragon voice  recognition software and may include unintentional dictation errors.   Dicky Anes, MD 05/03/24 1247

## 2024-05-03 NOTE — Progress Notes (Signed)
 Echocardiogram 2D Echocardiogram has been performed.  Kasyn Rolph N Janard Culp,RDCS 05/03/2024, 2:30 PM

## 2024-05-03 NOTE — ED Triage Notes (Signed)
 To ED for palpitations since over 1 week, seen here for same 1 week ago and was cardioverted. Took her Eliquis  this AM. HER 100-120, a fib on EKG. Endorses mild dizziness. Skin is dry, respirations unlabored.

## 2024-05-03 NOTE — H&P (Signed)
 History and Physical    SHEFALI NG FMW:[id number] DOB: [birth year] DOA: 05/03/2024  PCP: Sowles, Krichna, MD (Confirm with patient/family/NH records and if not entered, this has to be entered at [facility] point of entry) Patient coming from: Home  I have personally briefly reviewed patient's old medical records in [facility]  Chief Complaint: Palpitation  HPI: [name] is a 83 y.o. female with medical history significant of PAF on amiodarone  and Eliquis  status post recent cardioversion on 04/23/2024, chronic combined HFrEF and HFpEF, HTN, HLD, hypothyroidism, chronic LBBB, presented with palpitations.  Patient has history of chronic A-fib, recently last week, she started to develop palpitations and came to ED was found to be rapid A-fib, patient then underwent cardioversion which successfully turned her cardioversion back to sinus and patient discharged home.  She has been doing fine and went for cardiology follow-up 3 days ago without events.  Last night however she started to feel palpitations again and could not sleep last night and came to ED this morning.  Denied any chest pain cough shortness of breath lightheadedness blurry vision nauseous vomiting.  ED Course: Rapid A-fib heart rate 120-140s, blood pressure is SBP 150, O2 saturation 100% on room air.  Chest x-ray negative for acute infiltrates blood work showed K4.0 creatinine 1.0.  Patient was started on amiodarone  drip.  Review of Systems: As per HPI otherwise 14 point review of systems negative.    Past Medical History:  Diagnosis Date   Anxiety    Bronchitis    Cancer (HCC)    melanoma to RLE   Chronic combined systolic (congestive) and diastolic (congestive) heart failure (HCC)    a. 02/2018 Echo: EF 45-50%, diff HK, triv AI, mod MR, mildly dil LA/RA, nl RV fxn, mod TR. PASP 40-22mmHg.   Chronic kidney disease    DJD (degenerative joint disease), cervical    History of stress test    a. 01/2008 MV: EF 76%. Fair ex  tol. No ischemia.   Hyperlipidemia    Hypertension    Hypothyroid    LBBB (left bundle branch block)    Leg pain    a. 01/2018 ABI's wnl.   Lumbar spondylosis    Persistent atrial fibrillation (HCC)    a. 01/2018 Event monitor: 45 runs of SVT, longest 14.5 sec. SVT felt to be Afib/flutter-->2% burden. Longest run of AF 4h 77m (CHA2DS2VASc = 5-->Xarelto ); b. 03/2019 s/p DCCV; c. 04/2019 Repeat Amio load and DCCV.   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    Past Surgical History:  Procedure Laterality Date   BACK SURGERY  07/2019   CARDIOVERSION N/A 03/26/2018   Procedure: CARDIOVERSION;  Surgeon: Mady Bruckner, MD;  Location: [facility] ORS;  Service: Cardiovascular;  Laterality: N/A;   CARDIOVERSION N/A 04/24/2018   Procedure: CARDIOVERSION;  Surgeon: Perla Evalene PARAS, MD;  Location: [facility] ORS;  Service: Cardiovascular;  Laterality: N/A;   CATARACT EXTRACTION W/PHACO Right 07/12/2021   Procedure: CATARACT EXTRACTION PHACO AND INTRAOCULAR LENS PLACEMENT (IOC) RIGHT 16.92 01:30.8 ;  Surgeon: Jaye Fallow, MD;  Location: [facility];  Service: Ophthalmology;  Laterality: Right;   ELECTROPHYSIOLOGY STUDY N/A 02/27/2012   Procedure: ELECTROPHYSIOLOGY STUDY;  Surgeon: Danelle LELON Waddell, MD;  Location: [facility] CATH LAB;  Service: Cardiovascular;  Laterality: N/A;   EPS and ablation for SVT  5/13   slow pathway ablation by Dr Waddell   EYE SURGERY     surgery for detatched  retina   NECK SURGERY  02/2020   SUPRAVENTRICULAR TACHYCARDIA ABLATION N/A 02/27/2012   Procedure: SUPRAVENTRICULAR TACHYCARDIA ABLATION;  Surgeon: Danelle LELON Birmingham, MD;  Location: [facility] CATH LAB;  Service: Cardiovascular;  Laterality: N/A;   TEE WITHOUT CARDIOVERSION N/A 10/19/2021   Procedure: TRANSESOPHAGEAL ECHOCARDIOGRAM (TEE);  Surgeon: Darliss Rogue, MD;  Location: [facility] ORS;  Service: Cardiovascular;  Laterality: N/A;     reports that she has never smoked. She has never used  smokeless tobacco. She reports that she does not drink alcohol and does not use drugs.  Allergies  Allergen Reactions   Chocolate Swelling   Penicillins Swelling and Rash    Has patient had a PCN reaction causing immediate rash, facial/tongue/throat swelling, SOB or lightheadedness with hypotension: Yes Has patient had a PCN reaction causing severe rash involving mucus membranes or skin necrosis: No Has patient had a PCN reaction that required hospitalization: No Has patient had a PCN reaction occurring within the last 10 years: No If all of the above answers are NO, then may proceed with Cephalosporin use.    Codeine Nausea And Vomiting   Erythromycin Itching and Swelling   Septra [Sulfamethoxazole-Trimethoprim] Swelling    Family History  Problem Relation Age of Onset   Alzheimer's disease Mother    Stroke Father    Breast cancer Father    Heart disease Father        Pacemaker   Kidney cancer Maternal Grandmother    Alzheimer's disease Maternal Grandfather    Thyroid  disease Paternal Grandmother    Stroke Paternal Grandfather    Breast cancer Paternal Aunt    Cancer Daughter 56       ovarian cancer     Prior to Admission medications   Medication Sig Start Date End Date Taking? Authorizing Provider  amiodarone  (PACERONE ) 200 MG tablet Take 1 tablet (200 mg total) by mouth daily. 01/01/24  Yes Gollan, Timothy J, MD  amLODipine  (NORVASC ) 5 MG tablet TAKE 1 TABLET(5 MG) BY MOUTH DAILY 01/17/24  Yes Gollan, Timothy J, MD  apixaban  (ELIQUIS ) 2.5 MG TABS tablet TAKE 1 TABLET(2.5 MG) BY MOUTH TWICE DAILY 01/14/24  Yes Gollan, Timothy J, MD  busPIRone  (BUSPAR ) 5 MG tablet Take 1 tablet (5 mg total) by mouth 3 (three) times daily. 01/10/24  Yes Sowles, Krichna, MD  doxazosin  (CARDURA ) 1 MG tablet Take 1 tablet (1 mg total) by mouth daily. 01/01/24  Yes Gollan, Timothy J, MD  furosemide  (LASIX ) 20 MG tablet TAKE 1 TABLET(20 MG) BY MOUTH DAILY 01/17/24  Yes Gollan, Timothy J, MD   levothyroxine  (SYNTHROID ) 137 MCG tablet TAKE 1 TABLET(137 MCG) BY MOUTH DAILY BEFORE BREAKFAST 01/10/24  Yes Sowles, Krichna, MD  oxyCODONE  (OXY IR/ROXICODONE ) 5 MG immediate release tablet Take 5 mg by mouth every 6 (six) hours as needed. 02/22/24  Yes [provider]  potassium chloride  (KLOR-CON ) 10 MEQ tablet TAKE 1 TABLET BY MOUTH EVERY DAY AS DIRECTED. MAY TAKE AN EXTRA AFTER LUNCH WITH FLUID PILL AS NEEDED FOR SWELLING 01/01/24  Yes Gollan, Timothy J, MD  rosuvastatin  (CRESTOR ) 40 MG tablet Take 1 tablet (40 mg total) by mouth daily. 12/26/23  Yes Gollan, Timothy J, MD  temazepam  (RESTORIL ) 15 MG capsule Take 1 capsule (15 mg total) by mouth at bedtime. 02/08/24  Yes Sowles, Krichna, MD  acetaminophen  (TYLENOL ) 650 MG CR tablet Take 650-1,300 mg by mouth every 8 (eight) hours as needed for pain.     [provider]  cholecalciferol  (VITAMIN D ) 1000 UNITS tablet  Take 2,000 Units by mouth daily.    [provider]  Cranberry 500 MG TABS Take 2 tablets by mouth daily.    [provider]  loratadine  (CLARITIN ) 10 MG tablet Take 10 mg by mouth daily as needed for allergies or rhinitis.    [provider]  Melatonin 10 MG TABS Take 10 mg by mouth at bedtime as needed.    [provider]  Multiple Vitamin (MULITIVITAMIN WITH MINERALS) TABS Take 1 tablet by mouth daily.    [provider]    Physical Exam: Vitals:   05/03/24 1057 05/03/24 1125 05/03/24 1130 05/03/24 1230  BP:   (!) 154/81 127/66  Pulse:   (!) 119 (!) 101  Resp:   20 (!) 22  Temp:      TempSrc:      SpO2:  100% 100% 100%  Weight: 58 kg     Height: 5' 6 (1.676 m)       Constitutional: NAD, calm, comfortable Vitals:   05/03/24 1057 05/03/24 1125 05/03/24 1130 05/03/24 1230  BP:   (!) 154/81 127/66  Pulse:   (!) 119 (!) 101  Resp:   20 (!) 22  Temp:      TempSrc:      SpO2:  100% 100% 100%  Weight: 58 kg     Height: 5' 6 (1.676 m)      Eyes: PERRL, lids and  conjunctivae normal ENMT: Mucous membranes are moist. Posterior pharynx clear of any exudate or lesions.Normal dentition.  Neck: normal, supple, no masses, no thyromegaly Respiratory: clear to auscultation bilaterally, no wheezing, no crackles. Normal respiratory effort. No accessory muscle use.  Cardiovascular: Tachycardia, no murmurs / rubs / gallops. No extremity edema. 2+ pedal pulses. No carotid bruits.  Abdomen: no tenderness, no masses palpated. No hepatosplenomegaly. Bowel sounds positive.  Musculoskeletal: no clubbing / cyanosis. No joint deformity upper and lower extremities. Good ROM, no contractures. Normal muscle tone.  Skin: no rashes, lesions, ulcers. No induration Neurologic: CN 2-12 grossly intact. Sensation intact, DTR normal. Strength 5/5 in all 4.  Psychiatric: Normal judgment and insight. Alert and oriented x 3. Normal mood.     Labs on Admission: I have personally reviewed following labs and imaging studies  CBC: Recent Labs  Lab 05/03/24 1103  WBC 8.5  HGB 13.9  HCT 40.9  MCV 94.9  PLT 240   Basic Metabolic Panel: Recent Labs  Lab 05/03/24 1103  NA 136  K 4.0  CL 101  CO2 27  GLUCOSE 169*  BUN 15  CREATININE 0.81  CALCIUM  9.3   GFR: Estimated Creatinine Clearance: 49.9 mL/min (by C-G formula based on SCr of 0.81 mg/dL). Liver Function Tests: No results for input(s): AST, ALT, ALKPHOS, BILITOT, PROT, ALBUMIN in the last 168 hours. No results for input(s): LIPASE, AMYLASE in the last 168 hours. No results for input(s): AMMONIA in the last 168 hours. Coagulation Profile: No results for input(s): INR, PROTIME in the last 168 hours. Cardiac Enzymes: No results for input(s): CKTOTAL, CKMB, CKMBINDEX, TROPONINI in the last 168 hours. BNP (last 3 results) No results for input(s): PROBNP in the last 8760 hours. HbA1C: No results for input(s): HGBA1C in the last 72 hours. CBG: No results for input(s): GLUCAP in the  last 168 hours. Lipid Profile: No results for input(s): CHOL, HDL, LDLCALC, TRIG, CHOLHDL, LDLDIRECT in the last 72 hours. Thyroid  Function Tests: No results for input(s): TSH, T4TOTAL, FREET4, T3FREE, THYROIDAB in the last 72 hours. Anemia  Panel: No results for input(s): VITAMINB12, FOLATE, FERRITIN, TIBC, IRON, RETICCTPCT in the last 72 hours. Urine analysis:    Component Value Date/Time   COLORURINE AMBER (A) 12/28/2022 0815   APPEARANCEUR CLOUDY (A) 12/28/2022 0815   APPEARANCEUR Clear 04/27/2020 1135   LABSPEC 1.025 12/28/2022 0815   PHURINE 5.0 12/28/2022 0815   GLUCOSEU NEGATIVE 12/28/2022 0815   HGBUR LARGE (A) 12/28/2022 0815   BILIRUBINUR SMALL (A) 12/28/2022 0815   BILIRUBINUR Negative 04/27/2020 1135   KETONESUR TRACE (A) 12/28/2022 0815   PROTEINUR >300 (A) 12/28/2022 0815   UROBILINOGEN 0.2 02/02/2020 1402   NITRITE NEGATIVE 12/28/2022 0815   LEUKOCYTESUR LARGE (A) 12/28/2022 0815    Radiological Exams on Admission: DG Chest 2 View Result Date: 05/03/2024 CLINICAL DATA:  Palpitations and chest pressure. EXAM: CHEST - 2 VIEW COMPARISON:  04/23/2024. FINDINGS: The heart is enlarged the mediastinal contour stable. There is atherosclerotic calcification of the aorta. No consolidation, effusion, or pneumothorax is seen. No acute osseous abnormality. IMPRESSION: No active cardiopulmonary disease. Electronically Signed   By: Leita Birmingham M.D.   On: 05/03/2024 11:45    EKG: Independently reviewed.  A-fib with RVR, chronic LBBB  Assessment/Plan Principal Problem:   Afib (HCC) Active Problems:   AF (paroxysmal atrial fibrillation) (HCC)   Atrial fibrillation with RVR (HCC)  (please populate well all problems here in Problem List. (For example, if patient is on BP meds at home and you resume or decide to hold them, it is a problem that needs to be her. Same for CAD, COPD, HLD and so on)  A-fib with RVR, failed outpatient management -  Continue amiodarone  drip.  Cardiology discussed with patient regarding another cardioversion, patient however is rather reluctant to have another cardioversion insertion short interval. - Patient has history of oversensitivity to beta-blocker, when she developed severe bradycardia and hypotension.  Will hold off as needed metoprolol  - Continue Eliquis   HTN - Stable, continue amlodipine   Hypothyroidism - Check TSH - Continue Synthroid   Chronic combined HFpEF and HFrEF - Euvolemic - Continue daily Lasix   DVT prophylaxis: Eliquis  Code Status: Full code Family Communication: Husband at bedside Disposition Plan: Patient sick with A-fib RVR failed outpatient management requiring emergent drip/loading which takes 24-48 hours, expect more than 2 midnight hospital stay Consults called: Cardiology Admission status: PCU admit   Cort ONEIDA Mana MD [facility] Pager [phone]  05/03/2024, 1:13 PM

## 2024-05-03 NOTE — ED Notes (Signed)
 Assisted pt to bedside commode and provided warm blankets to pt at this time. Call bell is within reach and by bedside at this time.

## 2024-05-03 NOTE — ED Notes (Signed)
 Pt ambulatory to restroom with x1 assist. Returned to bed without incident. CCB within reach.

## 2024-05-03 NOTE — Consult Note (Addendum)
 Cardiology Consultation   Patient ID: [name] MRN: [id number]; DOB: [birth year]  Admit date: 05/03/2024 Date of Consult: 05/03/2024  PCP:  Sowles, Krichna, MD   [facility] Providers Cardiologist:  Evalene Lunger, MD        Patient Profile: [name] is a 83 y.o. female with a hx of atrial fibrillation who is being seen 05/03/2024 for the evaluation of palpitations, A-fib RVR at the request of Dr. Dicky.  History of Present Illness: [name] is an 83 year old female with history of paroxysmal atrial fibrillation, s/p DC cardioversion as recently as 04/23/2024, NSVT s/p ablation 2013, hypertension presenting with symptoms of palpitations.  Patient well-known to the practice, recent admission earlier this month for palpitations and A-fib RVR.  Underwent cardioversion successfully, compliant with amiodarone , Eliquis .  Has been doing well until yesterday when she started having symptoms of palpitations, prompting her to come to the ED this morning.  In the ED EKG showed A-fib with RVR heart rate 125.  Troponin was normal.  Repeat DC cardioversion considered, patient preferred not to, more accepting for amiodarone /chemical cardioversion instead.   Past Medical History:  Diagnosis Date   Anxiety    Bronchitis    Cancer (HCC)    melanoma to RLE   Chronic combined systolic (congestive) and diastolic (congestive) heart failure (HCC)    a. 02/2018 Echo: EF 45-50%, diff HK, triv AI, mod MR, mildly dil LA/RA, nl RV fxn, mod TR. PASP 40-13mmHg.   Chronic kidney disease    DJD (degenerative joint disease), cervical    History of stress test    a. 01/2008 MV: EF 76%. Fair ex tol. No ischemia.   Hyperlipidemia    Hypertension    Hypothyroid    LBBB (left bundle branch block)    Leg pain    a. 01/2018 ABI's wnl.   Lumbar spondylosis    Persistent atrial fibrillation (HCC)    a. 01/2018 Event monitor: 45 runs of SVT, longest 14.5 sec. SVT felt to be Afib/flutter-->2%  burden. Longest run of AF 4h 36m (CHA2DS2VASc = 5-->Xarelto ); b. 03/2019 s/p DCCV; c. 04/2019 Repeat Amio load and DCCV.   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    Past Surgical History:  Procedure Laterality Date   BACK SURGERY  07/2019   CARDIOVERSION N/A 03/26/2018   Procedure: CARDIOVERSION;  Surgeon: Mady Bruckner, MD;  Location: [facility] ORS;  Service: Cardiovascular;  Laterality: N/A;   CARDIOVERSION N/A 04/24/2018   Procedure: CARDIOVERSION;  Surgeon: Lunger Evalene PARAS, MD;  Location: [facility] OR[facility];  Service: Cardiovascular;  Laterality: N/A;   CATARACT EXTRACTION W/PHACO Right 07/12/2021   Procedure: CATARACT EXTRACTION PHACO AND INTRAOCULAR LENS PLACEMENT (IOC) RIGHT 16.92 01:30.8 ;  Surgeon: Jaye Fallow, MD;  Location: [facility];  Service: Ophthalmology;  Laterality: Right;   ELECTROPHYSIOLOGY STUDY N/A 02/27/2012   Procedure: ELECTROPHYSIOLOGY STUDY;  Surgeon: Danelle LELON Waddell, MD;  Location: [facility] CATH LAB;  Service: Cardiovascular;  Laterality: N/A;   EPS and ablation for SVT  5/13   slow pathway ablation by Dr Waddell   EYE SURGERY     surgery for detatched retina   NECK SURGERY  02/2020   SUPRAVENTRICULAR TACHYCARDIA ABLATION N/A 02/27/2012   Procedure: SUPRAVENTRICULAR TACHYCARDIA ABLATION;  Surgeon: Danelle LELON Waddell, MD;  Location: [facility] CATH LAB;  Service: Cardiovascular;  Laterality: N/A;   TEE WITHOUT CARDIOVERSION N/A 10/19/2021   Procedure: TRANSESOPHAGEAL ECHOCARDIOGRAM (TEE);  Surgeon: Darliss Rogue,  MD;  Location: [facility] ORS;  Service: Cardiovascular;  Laterality: N/A;     Home Medications:  Prior to Admission medications   Medication Sig Start Date End Date Taking? Authorizing Provider  acetaminophen  (TYLENOL ) 650 MG CR tablet Take 650-1,300 mg by mouth every 8 (eight) hours as needed for pain.     [provider]  amiodarone  (PACERONE ) 200 MG tablet Take 1 tablet (200 mg total) by mouth daily. 01/01/24   Gollan,  Timothy J, MD  amLODipine  (NORVASC ) 5 MG tablet TAKE 1 TABLET(5 MG) BY MOUTH DAILY 01/17/24   Gollan, Timothy J, MD  apixaban  (ELIQUIS ) 2.5 MG TABS tablet TAKE 1 TABLET(2.5 MG) BY MOUTH TWICE DAILY 01/14/24   Gollan, Timothy J, MD  busPIRone  (BUSPAR ) 5 MG tablet Take 1 tablet (5 mg total) by mouth 3 (three) times daily. 01/10/24   Sowles, Krichna, MD  cholecalciferol  (VITAMIN D ) 1000 UNITS tablet Take 2,000 Units by mouth daily.    [provider]  Cranberry 500 MG TABS Take 2 tablets by mouth daily.    [provider]  doxazosin  (CARDURA ) 1 MG tablet Take 1 tablet (1 mg total) by mouth daily. 01/01/24   Gollan, Timothy J, MD  furosemide  (LASIX ) 20 MG tablet TAKE 1 TABLET(20 MG) BY MOUTH DAILY 01/17/24   Gollan, Timothy J, MD  levothyroxine  (SYNTHROID ) 137 MCG tablet TAKE 1 TABLET(137 MCG) BY MOUTH DAILY BEFORE BREAKFAST 01/10/24   Glenard, Krichna, MD  loratadine  (CLARITIN ) 10 MG tablet Take 10 mg by mouth daily as needed for allergies or rhinitis.    [provider]  Melatonin 10 MG TABS Take 10 mg by mouth at bedtime as needed.    [provider]  Multiple Vitamin (MULITIVITAMIN WITH MINERALS) TABS Take 1 tablet by mouth daily.    [provider]  potassium chloride  (KLOR-CON ) 10 MEQ tablet TAKE 1 TABLET BY MOUTH EVERY DAY AS DIRECTED. MAY TAKE AN EXTRA AFTER LUNCH WITH FLUID PILL AS NEEDED FOR SWELLING 01/01/24   Gollan, Timothy J, MD  rosuvastatin  (CRESTOR ) 40 MG tablet Take 1 tablet (40 mg total) by mouth daily. 12/26/23   Gollan, Timothy J, MD  temazepam  (RESTORIL ) 15 MG capsule Take 1 capsule (15 mg total) by mouth at bedtime. 02/08/24   Sowles, Krichna, MD    Scheduled Meds:  amiodarone   150 mg Intravenous Once   Continuous Infusions:  amiodarone      Followed by   amiodarone      PRN Meds:   Allergies:    Allergies  Allergen Reactions   Chocolate Swelling   Penicillins Swelling and Rash    Has patient had a PCN reaction causing immediate  rash, facial/tongue/throat swelling, SOB or lightheadedness with hypotension: Yes Has patient had a PCN reaction causing severe rash involving mucus membranes or skin necrosis: No Has patient had a PCN reaction that required hospitalization: No Has patient had a PCN reaction occurring within the last 10 years: No If all of the above answers are NO, then may proceed with Cephalosporin use.    Codeine Nausea And Vomiting   Erythromycin Itching and Swelling   Septra [Sulfamethoxazole-Trimethoprim] Swelling    Social History:   Social History   Socioeconomic History   Marital status: Widowed    Spouse name: Not on file   Number of children: 1   Years of education: Not on file   Highest education level: 12th grade  Occupational History   Occupation: Retired  Tobacco Use   Smoking status: Never  Smokeless tobacco: Never   Tobacco comments:    smoking cessation materials not required  Vaping Use   Vaping status: Never Used  Substance and Sexual Activity   Alcohol use: No   Drug use: No   Sexual activity: Not Currently    Partners: Male    Birth control/protection: Post-menopausal  Other Topics Concern   Not on file  Social History Narrative   Lives in [place], she has a boyfriend   She has cats, some inside, some outside    Social Drivers of Health   Financial Resource Strain: Low Risk  (06/05/2023)   Overall Financial Resource Strain (CARDIA)    Difficulty of Paying Living Expenses: Not hard at all  Food Insecurity: No Food Insecurity (06/05/2023)   Hunger Vital Sign    Worried About Running Out of Food in the Last Year: Never true    Ran Out of Food in the Last Year: Never true  Transportation Needs: No Transportation Needs (06/05/2023)   PRAPARE - Administrator, Civil Service (Medical): No    Lack of Transportation (Non-Medical): No  Physical Activity: Sufficiently Active (06/05/2023)   Exercise Vital Sign    Days of Exercise per Week: 5 days     Minutes of Exercise per Session: 30 min  Stress: No Stress Concern Present (06/05/2023)   [employer] of Occupational Health - Occupational Stress Questionnaire    Feeling of Stress : Not at all  Social Connections: Socially Integrated (06/05/2023)   Social Connection and Isolation Panel    Frequency of Communication with Friends and Family: More than three times a week    Frequency of Social Gatherings with Friends and Family: More than three times a week    Attends Religious Services: More than 4 times per year    Active Member of [employer] or Organizations: Yes    Attends Engineer, structural: More than 4 times per year    Marital Status: Married  Catering manager Violence: Not At Risk (06/05/2023)   Humiliation, Afraid, Rape, and Kick questionnaire    Fear of Current or Ex-Partner: No    Emotionally Abused: No    Physically Abused: No    Sexually Abused: No    Family History:    Family History  Problem Relation Age of Onset   Alzheimer's disease Mother    Stroke Father    Breast cancer Father    Heart disease Father        Pacemaker   Kidney cancer Maternal Grandmother    Alzheimer's disease Maternal Grandfather    Thyroid  disease Paternal Grandmother    Stroke Paternal Grandfather    Breast cancer Paternal Aunt    Cancer Daughter 56       ovarian cancer     ROS:  Please see the history of present illness.   All other ROS reviewed and negative.     Physical Exam/Data: Vitals:   05/03/24 1056 05/03/24 1057 05/03/24 1125 05/03/24 1130  BP: (!) 153/103   (!) 154/81  Pulse: (!) 124   (!) 119  Resp: 18   20  Temp: 98 F (36.7 C)     TempSrc: Oral     SpO2: 98%  100% 100%  Weight:  58 kg    Height:  5' 6 (1.676 m)     No intake or output data in the 24 hours ending 05/03/24 1248    05/03/2024   10:57 AM 04/30/2024    9:52 AM 04/23/2024  9:18 AM  Last 3 Weights  Weight (lbs) 127 lb 13.9 oz 127 lb 6.4 oz 125 lb 10.6 oz  Weight (kg) 58 kg 57.788 kg 57  kg     Body mass index is 20.64 kg/m.  General:  Well nourished, well developed, in no acute distress HEENT: normal Neck: no JVD Vascular: No carotid bruits; Distal pulses 2+ bilaterally Cardiac: Irregular irregular, tachycardic Lungs:  clear to auscultation bilaterally, no wheezing, rhonchi or rales  Abd: soft, nontender, no hepatomegaly  Ext: no edema Musculoskeletal:  No deformities, BUE and BLE strength normal and equal Skin: warm and dry  Neuro:  CNs 2-12 intact, no focal abnormalities noted Psych:  Normal affect   EKG:  The EKG was personally reviewed and demonstrates: Atrial fibrillation, left bundle branch block, heart rate 125 Telemetry:  Telemetry was personally reviewed and demonstrates: Atrial fibrillation, heart rate 130  Relevant CV Studies: Echo 2022 EF 55 to 60%  Laboratory Data: High Sensitivity Troponin:   Recent Labs  Lab 04/23/24 0920 04/23/24 1121 05/03/24 1103  TROPONINIHS 7 9 8      Chemistry Recent Labs  Lab 05/03/24 1103  NA 136  K 4.0  CL 101  CO2 27  GLUCOSE 169*  BUN 15  CREATININE 0.81  CALCIUM  9.3  GFRNONAA >60  ANIONGAP 8    No results for input(s): PROT, ALBUMIN, AST, ALT, ALKPHOS, BILITOT in the last 168 hours. Lipids No results for input(s): CHOL, TRIG, HDL, LABVLDL, LDLCALC, CHOLHDL in the last 168 hours.  Hematology Recent Labs  Lab 05/03/24 1103  WBC 8.5  RBC 4.31  HGB 13.9  HCT 40.9  MCV 94.9  MCH 32.3  MCHC 34.0  RDW 13.5  PLT 240   Thyroid  No results for input(s): TSH, FREET4 in the last 168 hours.  BNPNo results for input(s): BNP, PROBNP in the last 168 hours.  DDimer No results for input(s): DDIMER in the last 168 hours.  Radiology/Studies:  DG Chest 2 View Result Date: 05/03/2024 CLINICAL DATA:  Palpitations and chest pressure. EXAM: CHEST - 2 VIEW COMPARISON:  04/23/2024. FINDINGS: The heart is enlarged the mediastinal contour stable. There is atherosclerotic calcification  of the aorta. No consolidation, effusion, or pneumothorax is seen. No acute osseous abnormality. IMPRESSION: No active cardiopulmonary disease. Electronically Signed   By: Leita Birmingham M.D.   On: 05/03/2024 11:45     Assessment and Plan: A-fib RVR - Recent DCCV about 10 days ago. - Start IV amiodarone  drip with bolus. - Continue Eliquis  5 mg twice daily - EP consult on Monday -Consider DCCV prior to discharge if patient still in A-fib despite IV Amio.  Has not missed dose of Eliquis . - Repeat echocardiogram, prior was in 2022 showing normal EF.  2.  Hypertension - PTA Norvasc  5 mg daily.   Signed, Redell Cave, MD  05/03/2024 12:48 PM

## 2024-05-03 NOTE — ED Notes (Signed)
 Blue top sent with labs. 22# placed--unable to get 20# because small vein.

## 2024-05-04 DIAGNOSIS — I5032 Chronic diastolic (congestive) heart failure: Secondary | ICD-10-CM | POA: Diagnosis not present

## 2024-05-04 DIAGNOSIS — I10 Essential (primary) hypertension: Secondary | ICD-10-CM

## 2024-05-04 DIAGNOSIS — E039 Hypothyroidism, unspecified: Secondary | ICD-10-CM

## 2024-05-04 DIAGNOSIS — E785 Hyperlipidemia, unspecified: Secondary | ICD-10-CM | POA: Diagnosis not present

## 2024-05-04 DIAGNOSIS — I4891 Unspecified atrial fibrillation: Secondary | ICD-10-CM

## 2024-05-04 LAB — BASIC METABOLIC PANEL WITH GFR
Anion gap: 8 (ref 5–15)
BUN: 16 mg/dL (ref 8–23)
CO2: 25 mmol/L (ref 22–32)
Calcium: 9.1 mg/dL (ref 8.9–10.3)
Chloride: 100 mmol/L (ref 98–111)
Creatinine, Ser: 0.81 mg/dL (ref 0.44–1.00)
GFR, Estimated: >60 mL/min (ref >60)
Glucose, Bld: 135 mg/dL — ABNORMAL HIGH (ref 70–99)
Potassium: 4 mmol/L (ref 3.5–5.1)
Sodium: 133 mmol/L — ABNORMAL LOW (ref 135–145)

## 2024-05-04 LAB — ECHOCARDIOGRAM COMPLETE
AR max vel: 2.33 cm2
AV Area VTI: 2.31 cm2
AV Area mean vel: 2.45 cm2
AV Mean grad: 2 mmHg
AV Peak grad: 3.9 mmHg
Ao pk vel: 0.99 m/s
Area-P 1/2: 3.4 cm2
Height: 66 in
S' Lateral: 3.2 cm
Weight: 2045.87 [oz_av]

## 2024-05-04 NOTE — Assessment & Plan Note (Signed)
-   Continue home Cardura  and amlodipine

## 2024-05-04 NOTE — ED Notes (Signed)
 Pt assisted to [facility] by NT Ariel and back to bed by this RN 1 assist.

## 2024-05-04 NOTE — ED Notes (Signed)
 Pt assisted with restroom at this time. Pt assisted to bedside commode. Pt back in stretcher. Pt back on CCM and pulse ox.

## 2024-05-04 NOTE — Progress Notes (Signed)
 Rounding Note   Patient Name: [name] Date of Encounter: 05/04/2024  [facility] Cardiologist: Evalene Lunger, MD   Subjective Patient reports feeling better this morning with improvements in palpitations. She denies chest pain and shortness of breath. She remains in atrial fibrillation with controlled ventricular rates.   Scheduled Meds:  amLODipine   5 mg Oral Daily   apixaban   2.5 mg Oral BID   busPIRone   5 mg Oral TID   doxazosin   1 mg Oral q1800   furosemide   20 mg Oral Daily   levothyroxine   137 mcg Oral Q0600   rosuvastatin   40 mg Oral q1800   temazepam   15 mg Oral QHS   Continuous Infusions:  amiodarone  30 mg/hr (05/04/24 0231)   PRN Meds: acetaminophen , loratadine , melatonin, ondansetron  **OR** ondansetron  (ZOFRAN ) IV, oxyCODONE    Vital Signs  Vitals:   05/04/24 0230 05/04/24 0239 05/04/24 0500 05/04/24 0738  BP: 102/73  115/61 105/61  Pulse:   75 85  Resp: (!) 22  18 16   Temp:  (!) 96.4 F (35.8 C)  (!) 97 F (36.1 C)  TempSrc:  Axillary  Axillary  SpO2: 100%  100% 98%  Weight:      Height:       No intake or output data in the 24 hours ending 05/04/24 0938    05/03/2024   10:57 AM 04/30/2024    9:52 AM 04/23/2024    9:18 AM  Last 3 Weights  Weight (lbs) 127 lb 13.9 oz 127 lb 6.4 oz 125 lb 10.6 oz  Weight (kg) 58 kg 57.788 kg 57 kg      Telemetry Atrial fibrillation rate 70-110 bpm - Personally Reviewed  Physical Exam  GEN: No acute distress.   Neck: No JVD Cardiac: IRIR, no murmurs, rubs, or gallops.  Respiratory: Clear to auscultation bilaterally. GI: Soft, nontender, non-distended  MS: No edema; No deformity. Neuro:  Nonfocal  Psych: Normal affect   Labs High Sensitivity Troponin:   Recent Labs  Lab 04/23/24 0920 04/23/24 1121 05/03/24 1103 05/03/24 1256  TROPONINIHS 7 9 8 10      Chemistry Recent Labs  Lab 05/03/24 1103 05/03/24 1257 05/04/24 0449  NA 136  --  133*  K 4.0  --  4.0  CL 101  --  100  CO2 27   --  25  GLUCOSE 169*  --  135*  BUN 15  --  16  CREATININE 0.81  --  0.81  CALCIUM  9.3  --  9.1  MG  --  2.1  --   GFRNONAA >60  --  >60  ANIONGAP 8  --  8    Lipids No results for input(s): CHOL, TRIG, HDL, LABVLDL, LDLCALC, CHOLHDL in the last 168 hours.  Hematology Recent Labs  Lab 05/03/24 1103  WBC 8.5  RBC 4.31  HGB 13.9  HCT 40.9  MCV 94.9  MCH 32.3  MCHC 34.0  RDW 13.5  PLT 240   Thyroid   Recent Labs  Lab 05/03/24 1257  TSH 1.579    BNPNo results for input(s): BNP, PROBNP in the last 168 hours.  DDimer No results for input(s): DDIMER in the last 168 hours.   Radiology  DG Chest 2 View Result Date: 05/03/2024 CLINICAL DATA:  Palpitations and chest pressure. EXAM: CHEST - 2 VIEW COMPARISON:  04/23/2024. FINDINGS: The heart is enlarged the mediastinal contour stable. There is atherosclerotic calcification of the aorta. No consolidation, effusion, or pneumothorax is seen. No acute osseous abnormality.  IMPRESSION: No active cardiopulmonary disease. Electronically Signed   By: Leita Birmingham M.D.   On: 05/03/2024 11:45    Cardiac Studies Echo 2022 EF 55 to 60%  Patient Profile   83 y.o. female with a history of AVNRT status post catheter ablation, PAF s/p multiple DCCVs, combined systolic and diastolic CHF, orthostasis, chronic left bundle branch block, hypertension, hyperlipidemia, degenerative joint disease, leg pain with normal ABIs (01/2017), and mild carotid artery disease who is being seen for the ongoing management of atrial fibrillation with RVR.   Assessment & Plan   Atrial fibrillation with RVR - Longstanding history of afib with recent DCCV 7/2 - Continued on Eliquis  2.5 mg twice daily (age and weight) for CHA2DS2VASc of at least 6 - Repeat echo pending - Remains in atrial fibrillation with rates reasonably well controlled at 70-110 bpm - Continue IV amiodarone   - Consider DCCV prior to discharge if patient does not convert to sinus  rhythm, although patient would prefer to avoid this. No missed doses of Eliquis .  - Consider EP consult tomorrow  Hypertension - BP elevated on admission, now improved - Continue PTA amlodipine   Chronic combined systolic and diastolic HF - Most recent echo 2022 with EF 55-60% - Repeat echo pending - Appears euvolemic and well compensated on exam - GDMT previously limited by orthostasis and bradycardia - Continue furosemide  20 mg daily   For questions or updates, please contact [facility] Please consult [email or web address] for contact info under     Signed, [name], PA-C  05/04/2024, 9:38 AM

## 2024-05-04 NOTE — ED Notes (Signed)
 Assumed care of pt. Report received from previous RN. Pt alert and oriented. Pt on CCM and pulse ox at this time. Pt RR even and unlabored. Pt denies any needs at this time.

## 2024-05-04 NOTE — ED Notes (Signed)
 Provider at bedside

## 2024-05-04 NOTE — Assessment & Plan Note (Signed)
 TSH normal -Continue home Synthroid

## 2024-05-04 NOTE — Hospital Course (Addendum)
 Taken from H&P.   [name] is a 83 y.o. female with medical history significant of PAF on amiodarone  and Eliquis  status post recent cardioversion on 04/23/2024, chronic  HFpEF, HTN, HLD, hypothyroidism, chronic LBBB, presented with palpitations, not chest pain.  She was found to be in A-fib with RVR, heart rate in 120-140s.  Labs and chest x-ray was without any acute abnormality.  Patient was started on amiodarone  infusion and cardiology was consulted.  7/13: Vitals stable, heart rate in 80-90s, TSH normal at 1.579, magnesium  and phosphorus normal, BMP with mild hyponatremia at 133 and rest unremarkable with potassium of 4.0, troponin remain negative.  Echocardiogram with low normal EF, indeterminate diastolic parameter and biatrial dilatation. Continuing amiodarone  infusion today and EP team will see her tomorrow.  7/14: Vital stable, heart rate in 90s, patient was found to have a pressure injury which is present on admission involving right leg-wound care is following. EP is planning repeat cardioversion tomorrow morning and also recommending increasing amiodarone  to 200 mg twice daily and outpatient follow-up to discuss ablation.  7/15: Hemodynamically stable, s/p successful DCCV and converted to NSR.  Patient remains stable and able to ambulate after the procedure.  Cardiology increased home amiodarone  to 200 mg twice daily and they will follow-up closely as she most likely require an ablation which will be arranged as outpatient.  Patient will continue with the rest of her home medications and follow-up with her providers for further assistance.

## 2024-05-04 NOTE — Assessment & Plan Note (Signed)
 Patient with history of recent DCCV.  Rate currently improved but remained in atrial fibrillation. Cardiology is on board-EP will evaluate tomorrow, might need another DCCV. Patient has history of oversensitivity to beta-blocker, when she developed severe bradycardia and hypotension. Will hold off as needed metoprolol   - Continue with amiodarone  infusion - Continue with Eliquis  - Continue with supportive care

## 2024-05-04 NOTE — Assessment & Plan Note (Signed)
 Repeat echocardiogram with similar low normal EF and indeterminate diastolic function, biatrial dilatation.  Clinically appears euvolemic. - Continue home Lasix  -Monitor volume status

## 2024-05-04 NOTE — Assessment & Plan Note (Signed)
 Continue Crestor

## 2024-05-04 NOTE — Progress Notes (Signed)
  Progress Note   Patient: [name] FMW:[id number] DOB: 12/12/1940 DOA: 05/03/2024     1 DOS: the patient was seen and examined on 05/04/2024   Brief hospital course: Taken from H&P.   [name] is a 83 y.o. female with medical history significant of PAF on amiodarone  and Eliquis  status post recent cardioversion on 04/23/2024, chronic  HFpEF, HTN, HLD, hypothyroidism, chronic LBBB, presented with palpitations, not chest pain.  She was found to be in A-fib with RVR, heart rate in 120-140s.  Labs and chest x-ray was without any acute abnormality.  Patient was started on amiodarone  infusion and cardiology was consulted.  7/13: Vitals stable, heart rate in 80-90s, TSH normal at 1.579, magnesium  and phosphorus normal, BMP with mild hyponatremia at 133 and rest unremarkable with potassium of 4.0, troponin remain negative.  Echocardiogram with low normal EF, indeterminate diastolic parameter and biatrial dilatation. Continuing amiodarone  infusion today and EP team will see her tomorrow.  Assessment and Plan: * Atrial fibrillation with RVR (HCC) Patient with history of recent DCCV.  Rate currently improved but remained in atrial fibrillation. Cardiology is on board-EP will evaluate tomorrow, might need another DCCV. Patient has history of oversensitivity to beta-blocker, when she developed severe bradycardia and hypotension. Will hold off as needed metoprolol   - Continue with amiodarone  infusion - Continue with Eliquis  - Continue with supportive care  Chronic heart failure with preserved ejection fraction (HFpEF) (HCC) Repeat echocardiogram with similar low normal EF and indeterminate diastolic function, biatrial dilatation.  Clinically appears euvolemic. - Continue home Lasix  -Monitor volume status  Essential hypertension - Continue home Cardura  and amlodipine   Dyslipidemia - Continue Crestor   Hypothyroid TSH normal. -Continue home Synthroid    Subjective: Patient was seen  and examined today.  Denies any chest pain, shortness of breath or palpitation.  Physical Exam: Vitals:   05/04/24 0937 05/04/24 1043 05/04/24 1100 05/04/24 1300  BP: 102/61 (!) 99/55 (!) 92/57 108/67  Pulse:  93 82 97  Resp:  14 15 15   Temp:   (!) 96.8 F (36 C)   TempSrc:   Axillary   SpO2:  99% 99% 99%  Weight:      Height:       General.  Malnourished elderly lady, in no acute distress. Pulmonary.  Lungs clear bilaterally, normal respiratory effort. CV.  Irregularly irregular Abdomen.  Soft, nontender, nondistended, BS positive. CNS.  Alert and oriented .  No focal neurologic deficit. Extremities.  No edema,  pulses intact and symmetrical. Psychiatry.  Judgment and insight appears normal.   Data Reviewed: Prior data reviewed  Family Communication: Discussed with husband at bedside  Disposition: Status is: Inpatient Remains inpatient appropriate because: Verity of illness  Planned Discharge Destination: Home  DVT prophylaxis.  Eliquis  Time spent: 50 minutes  This record has been created using Conservation officer, historic buildings. Errors have been sought and corrected,but may not always be located. Such creation errors do not reflect on the standard of care.   Author: Amaryllis Dare, MD 05/04/2024 2:11 PM  For on call review [email or web address].

## 2024-05-05 DIAGNOSIS — I4891 Unspecified atrial fibrillation: Secondary | ICD-10-CM | POA: Diagnosis not present

## 2024-05-05 DIAGNOSIS — E785 Hyperlipidemia, unspecified: Secondary | ICD-10-CM | POA: Diagnosis not present

## 2024-05-05 DIAGNOSIS — I10 Essential (primary) hypertension: Secondary | ICD-10-CM | POA: Diagnosis not present

## 2024-05-05 DIAGNOSIS — R001 Bradycardia, unspecified: Secondary | ICD-10-CM

## 2024-05-05 DIAGNOSIS — I5032 Chronic diastolic (congestive) heart failure: Secondary | ICD-10-CM | POA: Diagnosis not present

## 2024-05-05 MED ORDER — SODIUM CHLORIDE 0.9 % IV SOLN: INTRAVENOUS | Status: DC

## 2024-05-05 NOTE — Consult Note (Signed)
**Note Leslie-Identified via Obfuscation**  ELECTROPHYSIOLOGY CONSULT NOTE    Patient ID: [name] MRN: [id number], DOB/AGE: 05/06/1941 83 y.o.  Admit date: 05/03/2024 Date of Consult: 05/05/2024  Primary Physician: Sowles, Krichna, MD Primary Cardiologist: Dr. Perla Electrophysiologist: to establish with Dr. Kennyth  Patient Profile: [name] is a 83 y.o. female with a history of AVNRT ablation, atrial fibrillation, sick sinus syndrome and tach-brady who is being seen today for the evaluation of AF with RVR at the request of Dr. Franciso.  HPI:  [name] is a 83 y.o. female with a history of atrial fibrillation and sinus bradycardia admitted with AF and RVR. 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 She has been on amiodarone  since her first cardioversion, currently taking 200 mg daily. Despite this, she experiences episodes of rapid heart rate and palpitations.  She describes episodes of palpitations lasting a few seconds, with a recent prolonged episode that did not resolve, prompting her to seek medical attention. After her last cardioversion about a week ago, she maintained a normal rhythm and felt great until AF recurred prompting this admission. She has a history of hospitalization for congestive heart failure in the context of atrial fibrillation. Rate control has been challenging due to sinus bradycardia, and she has been unable to tolerate beta blockers.  She feels weak and experiences arthritis pain, which affects her comfort, especially while in bed.    She denies chest pain, PND, orthopnea, nausea, vomiting, dizziness, syncope, edema, or weight gain.  Past Medical History:  Diagnosis Date   Anxiety    Bronchitis    Cancer (HCC)    melanoma to RLE   Chronic combined systolic (congestive) and diastolic (congestive) heart failure (HCC)     a. 02/2018 Echo: EF 45-50%, diff HK, triv AI, mod MR, mildly dil LA/RA, nl RV fxn, mod TR. PASP 40-62mmHg.   Chronic kidney disease    DJD (degenerative joint disease), cervical    History of stress test    a. 01/2008 MV: EF 76%. Fair ex tol. No ischemia.   Hyperlipidemia    Hypertension    Hypothyroid    LBBB (left bundle branch block)    Leg pain    a. 01/2018 ABI's wnl.   Lumbar spondylosis    Persistent atrial fibrillation (HCC)    a. 01/2018 Event monitor: 45 runs of SVT, longest 14.5 sec. SVT felt to be Afib/flutter-->2% burden. Longest run of AF 4h 54m (CHA2DS2VASc = 5-->Xarelto ); b. 03/2019 s/p DCCV; c. 04/2019 Repeat Amio load and DCCV.   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    05/03/2024 at  8:00 AM   amiodarone  (PACERONE ) 200 MG tablet Take 1 tablet (200 mg total) by mouth daily. 90 tablet 3 05/03/2024 Morning   amLODipine  (NORVASC ) 5 MG tablet TAKE 1 TABLET(5 MG) BY MOUTH DAILY 90 tablet 3 05/03/2024 Morning   apixaban  (ELIQUIS ) 2.5 MG TABS tablet TAKE 1 TABLET(2.5 MG) BY MOUTH TWICE DAILY 180 tablet 1 05/03/2024 Morning   busPIRone  (BUSPAR ) 5 MG tablet Take 1 tablet (5 mg total) by  mouth 3 (three) times daily. (Patient taking differently: Take 5 mg by mouth 2 (two) times daily.) 270 tablet 1 05/03/2024   cholecalciferol  (VITAMIN D ) 1000 UNITS tablet Take 2,000 Units by mouth daily.   05/03/2024   doxazosin  (CARDURA ) 1 MG tablet Take 1 tablet (1 mg total) by mouth daily. 90 tablet 3 05/02/2024 Evening   furosemide  (LASIX ) 20 MG tablet TAKE 1 TABLET(20 MG) BY MOUTH DAILY 90 tablet 3 05/02/2024 Morning   levothyroxine  (SYNTHROID ) 137 MCG tablet TAKE 1 TABLET(137 MCG) BY MOUTH DAILY BEFORE BREAKFAST 90 tablet 1 05/03/2024 Morning    loratadine  (CLARITIN ) 10 MG tablet Take 10 mg by mouth daily as needed for allergies or rhinitis.   05/03/2024 Morning   Multiple Vitamin (MULITIVITAMIN WITH MINERALS) TABS Take 1 tablet by mouth daily.   05/02/2024 Evening   potassium chloride  (KLOR-CON ) 10 MEQ tablet TAKE 1 TABLET BY MOUTH EVERY DAY AS DIRECTED. MAY TAKE AN EXTRA AFTER LUNCH WITH FLUID PILL AS NEEDED FOR SWELLING 180 tablet 2 05/03/2024 Morning   rosuvastatin  (CRESTOR ) 40 MG tablet Take 1 tablet (40 mg total) by mouth daily. 90 tablet 3 05/02/2024 Evening   temazepam  (RESTORIL ) 15 MG capsule Take 1 capsule (15 mg total) by mouth at bedtime. 30 capsule 0 05/02/2024 Bedtime   Cranberry 500 MG TABS Take 2 tablets by mouth daily.      Melatonin 10 MG TABS Take 10 mg by mouth at bedtime as needed. (Patient not taking: Reported on 05/03/2024)   Not Taking   oxyCODONE  (OXY IR/ROXICODONE ) 5 MG immediate release tablet Take 5 mg by mouth every 6 (six) hours as needed. (Patient not taking: Reported on 05/03/2024)   Not Taking      Physical Exam: Vitals:   05/05/24 0400 05/05/24 0500 05/05/24 0800 05/05/24 0836  BP: (!) 98/53 (!) 110/56 (!) 113/59   Pulse: 87 92 92   Resp: 15 15 17    Temp:    98.4 F (36.9 C)  TempSrc:    Axillary  SpO2: 94% 94% 96%   Weight:      Height:        Gen: Appears comfortable, well-nourished CV: irregular rhythm, no dependent edema Pulm: breathing easily  PERTINENT STUDIES SUMMARIZED:  Echocardiogram:      TTE 05/03/2024:  LVEF 50-55%. Moderate biatrial dilation. Normal RV function. Mild-moderate TR. Grade I diastolic dysfunction.   EKG:   Atrial fibrillation (personally reviewed)  TELEMETRY:    Atrial fibrillation with controlled rates (personally reviewed)  DEVICE HISTORY:    N/A   ASSESSMENT & PLAN:  Atrial fibrillation with RVR Persistent, has failed amiodarone  200mg  daily Symptomatic with palpitations in RVR, fatigue with controlled rates S/p cardioversion last week with recurrence  of AF on amiodarone  Rates are currently controlled on amiodarone  Recommendations: increase amiodarone  to 200mg  BID, repeat cardioversion with follow-up to discuss outpatient ablation. If patient tolerates amiodarone  200mg  PO BID, may DC with EP follow-up Arrange follow-up with Dr. Kennyth in EP clinic  Sinus bradycardia, tachy-brady syndrome Limits use of rate controlling medication  CHFpEF Appears euvolemic and well-compensated at present Will benefit from rhythm control  Secondary hypercoagulable state Continue Apixaban  2.5 mg (age > 57 yo, weight < 60 kg)    For questions or updates, please contact [facility] Please consult [email or web address] for contact info under Cardiology/STEMI.  Signed, Eulas Furbish, MD 05/05/2024 8:37 AM

## 2024-05-05 NOTE — Consult Note (Addendum)
 WOC Nurse Consult Note: Reason for Consult: Requested to assess wound on R leg.  Performed remotely after notes and pic. Wound type: melanoma removal site. Pressure Injury POA: NA Measurement: 1 cm x 1.3 cm x 0.1 cm Wound bed: 1005 dark red Drainage (amount, consistency, odor) Minimum amount. Periwound: peeling skin. Dressing procedure/placement/frequency: Clean the skin with Vashe [id number]. Not rinse. Try to take off the peeling skin surrounding the wound gently. Apply Aquacel [id number] on the wound bed, cover with silicon foam dressing. Change every 3 days or PRN. Notes: The last dressing was 07/13. Next change can be 07/15.  WOC team will not plan to follow further.  Please reconsult if further assistance is needed. Thank-you,  Lela Holm BSN, RN, [employer], WOC  (Pager: [phone])

## 2024-05-05 NOTE — Assessment & Plan Note (Signed)
 Repeat echocardiogram with similar low normal EF and indeterminate diastolic function, biatrial dilatation.  Clinically appears euvolemic. - Continue home Lasix  -Monitor volume status

## 2024-05-05 NOTE — Plan of Care (Signed)
  Problem: Education: Goal: Knowledge of General Education information will improve Description: Including pain rating scale, medication(s)/side effects and non-pharmacologic comfort measures Outcome: Progressing   Problem: Clinical Measurements: Goal: Will remain free from infection Outcome: Progressing   Problem: Activity: Goal: Risk for activity intolerance will decrease Outcome: Progressing   Problem: Nutrition: Goal: Adequate nutrition will be maintained Outcome: Progressing   Problem: Coping: Goal: Level of anxiety will decrease Outcome: Progressing   Problem: Pain Managment: Goal: General experience of comfort will improve and/or be controlled Outcome: Progressing   Problem: Safety: Goal: Ability to remain free from injury will improve Outcome: Progressing   Problem: Skin Integrity: Goal: Risk for impaired skin integrity will decrease Outcome: Progressing

## 2024-05-05 NOTE — Progress Notes (Signed)
 Progress Note   Patient: [name] DOB: 02-07-1941 DOA: 05/03/2024     2 DOS: the patient was seen and examined on 05/05/2024   Brief hospital course: Taken from H&P.   [name] is a 83 y.o. female with medical history significant of PAF on amiodarone  and Eliquis  status post recent cardioversion on 04/23/2024, chronic  HFpEF, HTN, HLD, hypothyroidism, chronic LBBB, presented with palpitations, not chest pain.  She was found to be in A-fib with RVR, heart rate in 120-140s.  Labs and chest x-ray was without any acute abnormality.  Patient was started on amiodarone  infusion and cardiology was consulted.  7/13: Vitals stable, heart rate in 80-90s, TSH normal at 1.579, magnesium  and phosphorus normal, BMP with mild hyponatremia at 133 and rest unremarkable with potassium of 4.0, troponin remain negative.  Echocardiogram with low normal EF, indeterminate diastolic parameter and biatrial dilatation. Continuing amiodarone  infusion today and EP team will see her tomorrow.  7/14: Vital stable, heart rate in 90s, patient was found to have a pressure injury which is present on admission involving right leg-wound care is following. EP is planning repeat cardioversion tomorrow morning and also recommending increasing amiodarone  to 200 mg twice daily and outpatient follow-up to discuss ablation.  Assessment and Plan: * Atrial fibrillation with RVR (HCC) Patient with history of recent DCCV.  Rate currently improved but remained in atrial fibrillation. Cardiology is on board-EP is recommending repeat cardioversion tomorrow as she remained in A-fib, also recommendation to increase amiodarone  to 200 mg twice daily and close outpatient follow-up to discuss about ablation. Patient has history of oversensitivity to beta-blocker, when she developed severe bradycardia and hypotension. Will hold off as needed metoprolol   - Continue with amiodarone  infusion - Continue with Eliquis  -Repeat  DCCV tomorrow - Continue with supportive care  Chronic heart failure with preserved ejection fraction (HFpEF) (HCC) Repeat echocardiogram with similar low normal EF and indeterminate diastolic function, biatrial dilatation.  Clinically appears euvolemic. - Continue home Lasix  -Monitor volume status  Essential hypertension - Continue home Cardura  and amlodipine   Dyslipidemia - Continue Crestor   Hypothyroid TSH normal. -Continue home Synthroid    Subjective: Patient was seen and examined today.  She was complaining of some backache stating that is due to lying in bed.  Physical Exam: Vitals:   05/05/24 0900 05/05/24 1000 05/05/24 1210 05/05/24 1242  BP: 132/67  104/62   Pulse: 99     Resp: 20 17 (!) 21   Temp:    97.7 F (36.5 C)  TempSrc:    Oral  SpO2: 98% 95%    Weight:      Height:       General.  Malnourished elderly lady, in no acute distress. Pulmonary.  Lungs clear bilaterally, normal respiratory effort. CV.  Irregularly irregular Abdomen.  Soft, nontender, nondistended, BS positive. CNS.  Alert and oriented .  No focal neurologic deficit. Extremities.  No edema, no cyanosis, pulses intact and symmetrical. Psychiatry.  Judgment and insight appears normal.    Data Reviewed: Prior data reviewed  Family Communication: Talked with daughter on phone.  Disposition: Status is: Inpatient Remains inpatient appropriate because: Severity of illness  Planned Discharge Destination: Home  DVT prophylaxis.  Eliquis  Time spent: 50 minutes  This record has been created using Conservation officer, historic buildings. Errors have been sought and corrected,but may not always be located. Such creation errors do not reflect on the standard of care.   Author: Amaryllis Dare, MD 05/05/2024 1:25 PM  For  on call review [email or web address].

## 2024-05-05 NOTE — Plan of Care (Signed)

## 2024-05-05 NOTE — Progress Notes (Addendum)
 Patient states lower extremity dressing change was completed last night by night shift RN

## 2024-05-05 NOTE — Assessment & Plan Note (Signed)
 Patient with history of recent DCCV.  Rate currently improved but remained in atrial fibrillation. S/p repeat DCCV with successful conversion to NSR. Cardiology increased home dose of amiodarone  to 200 mg twice daily outpatient follow-up to discuss about ablation. Patient has history of oversensitivity to beta-blocker, when she developed severe bradycardia and hypotension. Will hold off as needed metoprolol   - Continue with amiodarone  infusion - Continue with Eliquis  -Repeat DCCV tomorrow - Continue with supportive care

## 2024-05-06 ENCOUNTER — Other Ambulatory Visit: Payer: Self-pay

## 2024-05-06 ENCOUNTER — Inpatient Hospital Stay: Admitting: Certified Registered Nurse Anesthetist

## 2024-05-06 ENCOUNTER — Encounter
Admission: EM | Disposition: A | Discharge status: Home / Self Care | Payer: Self-pay | Source: Home / Self Care | Attending: Internal Medicine

## 2024-05-06 DIAGNOSIS — I5032 Chronic diastolic (congestive) heart failure: Secondary | ICD-10-CM | POA: Diagnosis not present

## 2024-05-06 DIAGNOSIS — I4891 Unspecified atrial fibrillation: Secondary | ICD-10-CM | POA: Diagnosis not present

## 2024-05-06 DIAGNOSIS — I4819 Other persistent atrial fibrillation: Secondary | ICD-10-CM | POA: Diagnosis not present

## 2024-05-06 DIAGNOSIS — I10 Essential (primary) hypertension: Secondary | ICD-10-CM | POA: Diagnosis not present

## 2024-05-06 DIAGNOSIS — R001 Bradycardia, unspecified: Secondary | ICD-10-CM | POA: Diagnosis not present

## 2024-05-06 SURGERY — CARDIOVERSION
Anesthesia: General

## 2024-05-06 MED ORDER — PROPOFOL 10 MG/ML IV BOLUS
INTRAVENOUS | Status: DC | PRN
  Administered 2024-05-06: 30 mg via INTRAVENOUS
  Administered 2024-05-06: 10 mg via INTRAVENOUS

## 2024-05-06 MED ORDER — AMIODARONE HCL 200 MG PO TABS: 200.0000 mg | ORAL_TABLET | Freq: Two times a day (BID) | ORAL | 0 refills | Status: DC

## 2024-05-06 MED ORDER — DROPERIDOL 2.5 MG/ML IJ SOLN: 0.6250 mg | Freq: Once | INTRAMUSCULAR | Status: DC | PRN

## 2024-05-06 MED ORDER — OXYCODONE HCL 5 MG/5ML PO SOLN: 5.0000 mg | Freq: Once | ORAL | Status: DC | PRN

## 2024-05-06 MED ORDER — OXYCODONE HCL 5 MG PO TABS: 5.0000 mg | ORAL_TABLET | Freq: Once | ORAL | Status: DC | PRN

## 2024-05-06 MED ORDER — FENTANYL CITRATE (PF) 100 MCG/2ML IJ SOLN: 25.0000 ug | INTRAMUSCULAR | Status: DC | PRN

## 2024-05-06 MED ORDER — ACETAMINOPHEN 10 MG/ML IV SOLN: 1000.0000 mg | Freq: Once | INTRAVENOUS | Status: DC | PRN

## 2024-05-06 MED ORDER — AMIODARONE HCL 200 MG PO TABS
200.0000 mg | ORAL_TABLET | Freq: Two times a day (BID) | ORAL | Status: DC
  Administered 2024-05-06: 200 mg via ORAL
  Filled 2024-05-06: qty 1

## 2024-05-06 NOTE — Transfer of Care (Signed)
 Immediate Anesthesia Transfer of Care Note  Patient: [name]  Procedure(s) Performed: CARDIOVERSION  Patient Location: Cath Lab  Anesthesia Type:General  Level of Consciousness: awake, alert , and oriented  Airway & Oxygen Therapy: Patient Spontanous Breathing and Patient connected to nasal cannula oxygen  Post-op Assessment: Report given to RN and Post -op Vital signs reviewed and stable  Post vital signs: Reviewed and stable  Last Vitals:  Vitals Value Taken Time  BP 108/43 05/06/24 08:45  Temp 36.6 C 05/06/24 08:45  Pulse 57 05/06/24 08:45  Resp 14 05/06/24 08:45  SpO2 97 % 05/06/24 08:45  Vitals shown include unfiled device data.  Last Pain:  Vitals:   05/06/24 1022  TempSrc:   PainSc: 6       Patients Stated Pain Goal: 5 (05/06/24 9187)  Complications: No notable events documented.

## 2024-05-06 NOTE — Progress Notes (Signed)
 Rounding Note   Patient Name: [name] Date of Encounter: 05/06/2024  [facility] Cardiologist: Evalene Lunger, MD   Subjective Patient reports feeling well this morning after successful DCCV. She denies chest pain and shortness of breath. She is maintaining sinus rhythm.   Scheduled Meds:  amiodarone   200 mg Oral BID   amLODipine   5 mg Oral Daily   apixaban   2.5 mg Oral BID   busPIRone   5 mg Oral TID   doxazosin   1 mg Oral q1800   furosemide   20 mg Oral Daily   levothyroxine   137 mcg Oral Q0600   rosuvastatin   40 mg Oral q1800   temazepam   15 mg Oral QHS   Continuous Infusions:   PRN Meds: acetaminophen , loratadine , melatonin, ondansetron  **OR** ondansetron  (ZOFRAN ) IV, oxyCODONE    Vital Signs  Vitals:   05/06/24 0815 05/06/24 0830 05/06/24 0845 05/06/24 0919  BP: (!) 113/42 (!) 103/43 (!) 108/43 (!) 120/50  Pulse: (!) 58 (!) 56 60 60  Resp: 17 20 15 18   Temp:   97.9 F (36.6 C) 98.6 F (37 C)  TempSrc:   Temporal   SpO2: 99% 97% 97% 99%  Weight:      Height:        Intake/Output Summary (Last 24 hours) at 05/06/2024 1058 Last data filed at 05/05/2024 1700 Gross per 24 hour  Intake 240 ml  Output --  Net 240 ml      05/03/2024   10:57 AM 04/30/2024    9:52 AM 04/23/2024    9:18 AM  Last 3 Weights  Weight (lbs) 127 lb 13.9 oz 127 lb 6.4 oz 125 lb 10.6 oz  Weight (kg) 58 kg 57.788 kg 57 kg      Telemetry Sinus bradycardia, rate 50s bpm - Personally Reviewed  Physical Exam  GEN: No acute distress.   Neck: No JVD Cardiac: RRR, no murmurs, rubs, or gallops.  Respiratory: Clear to auscultation bilaterally. GI: Soft, nontender, non-distended  MS: Trace LE edema; No deformity. Neuro:  Nonfocal  Psych: Normal affect   Labs High Sensitivity Troponin:   Recent Labs  Lab 04/23/24 0920 04/23/24 1121 05/03/24 1103 05/03/24 1256  TROPONINIHS 7 9 8 10      Chemistry Recent Labs  Lab 05/03/24 1103 05/03/24 1257 05/04/24 0449  NA 136   --  133*  K 4.0  --  4.0  CL 101  --  100  CO2 27  --  25  GLUCOSE 169*  --  135*  BUN 15  --  16  CREATININE 0.81  --  0.81  CALCIUM  9.3  --  9.1  MG  --  2.1  --   GFRNONAA >60  --  >60  ANIONGAP 8  --  8    Lipids No results for input(s): CHOL, TRIG, HDL, LABVLDL, LDLCALC, CHOLHDL in the last 168 hours.  Hematology Recent Labs  Lab 05/03/24 1103  WBC 8.5  RBC 4.31  HGB 13.9  HCT 40.9  MCV 94.9  MCH 32.3  MCHC 34.0  RDW 13.5  PLT 240   Thyroid   Recent Labs  Lab 05/03/24 1257  TSH 1.579    BNPNo results for input(s): BNP, PROBNP in the last 168 hours.  DDimer No results for input(s): DDIMER in the last 168 hours.   Radiology  No results found.   Cardiac Studies Echo 2022 EF 55 to 60%  Patient Profile   83 y.o. female with a history of AVNRT status post  catheter ablation, PAF s/p multiple DCCVs, combined systolic and diastolic CHF, orthostasis, chronic left bundle branch block, hypertension, hyperlipidemia, degenerative joint disease, leg pain with normal ABIs (01/2017), and mild carotid artery disease who is being seen for the ongoing management of atrial fibrillation with RVR.   Assessment & Plan   Atrial fibrillation with RVR - Longstanding history of afib with recent DCCV 7/2 - Continued on Eliquis  2.5 mg twice daily (age and weight) for CHA2DS2VASc of at least 6 - Repeat echo with EF 50-55% - EP consulted yesterday and recommended repeat DCCV with an increase in amiodarone  on discharge - Patient underwent successful DCCV this morning  - Will transition from IV amiodarone  to oral at 200 mg twice daily per EP  Hypertension - BP elevated on admission, now improved - Continue PTA amlodipine   Chronic combined systolic and diastolic HF - Most recent echo 2022 with EF 55-60% - Repeat echo pending - Appears euvolemic and well compensated on exam - GDMT previously limited by orthostasis and bradycardia - Continue furosemide  20 mg  daily   For questions or updates, please contact [facility] Please consult [email or web address] for contact info under     Signed, [name], PA-C  05/06/2024, 10:58 AM

## 2024-05-06 NOTE — Progress Notes (Signed)
  Progress Note  Patient Name: [name] Date of Encounter: 05/06/2024 [facility] Cardiologist: Evalene Lunger, MD   Interval Summary   Patient feels better with resolution of palpitations following successful cardioversion this morning.  She denies chest pain and shortness of breath.  She notes some back pain from lying in bed and is also weak.  Vital Signs Vitals:   05/06/24 0815 05/06/24 0830 05/06/24 0845 05/06/24 0919  BP: (!) 113/42 (!) 103/43 (!) 108/43 (!) 120/50  Pulse: (!) 58 (!) 56 60 60  Resp: 17 20 15 18   Temp:   97.9 F (36.6 C) 98.6 F (37 C)  TempSrc:   Temporal   SpO2: 99% 97% 97% 99%  Weight:      Height:        Intake/Output Summary (Last 24 hours) at 05/06/2024 1102 Last data filed at 05/05/2024 1700 Gross per 24 hour  Intake 240 ml  Output --  Net 240 ml      05/03/2024   10:57 AM 04/30/2024    9:52 AM 04/23/2024    9:18 AM  Last 3 Weights  Weight (lbs) 127 lb 13.9 oz 127 lb 6.4 oz 125 lb 10.6 oz  Weight (kg) 58 kg 57.788 kg 57 kg      Telemetry/ECG  Since cardioversion this morning, patient has maintained sinus rhythm with heart rates of 55-65 bpm- Personally Reviewed  Physical Exam  GEN: No acute distress.   Neck: No JVD Cardiac: Bradycardic but regular without murmurs. Respiratory: Mildly diminished breath sounds throughout without wheezes or crackles. GI: Soft, nontender, non-distended  MS: Trace pretibial edema bilaterally; right shin covered with clean dressing.  Assessment & Plan  Atrial fibrillation with rapid ventricular response: Ms. Pritt underwent successful DCCV this morning and is maintaining sinus rhythm on amiodarone  infusion.  Per Dr. Peytyn recommendations yesterday, we will transition her from IV to oral amiodarone  200 mg twice daily and arrange for outpatient EP follow-up with Dr. Kennyth.  Continue apixaban  2.5 mg twice daily based on age and weight.  Sinus bradycardia with tachybrady syndrome: Mild sinus  bradycardia without symptoms noted following cardioversion.  Patient is not on any AV nodal blocking agents.  Continue to avoid beta-blockers and nondihydropyridine calcium  channel blockers.  Chronic HFpEF: Ms. Maclaren appears euvolemic.  Continue furosemide  20 mg p.o. daily.  [facility] will sign off.   The patient is ready for discharge today from a cardiac standpoint. Medication Recommendations: Amiodarone  200 mg twice daily.  Continue apixaban  2.5 mg twice daily and furosemide  20 mg p.o. daily. Other recommendations (labs, testing, etc): None. Follow up as an outpatient: Follow-up with Dr. Kennyth in 2-3 weeks.  If he does not have availability, we will have her see Dr. Gollan or an APP in that timeframe and consult with Dr. Kennyth afterwards.  For questions or updates, please contact [facility] Please consult [email or web address] for contact info under [facility] Cardiology.  Signed, Lonni Hanson, MD

## 2024-05-06 NOTE — CV Procedure (Signed)
Cardioversion procedure note °For atrial fibrillation, persistent. ° °Procedure Details: ° °Consent: Risks of procedure as well as the alternatives and risks of each were explained to the (patient/caregiver).  Consent for procedure obtained. ° °Time Out: Verified patient identification, verified procedure, site/side was marked, verified correct patient position, special equipment/implants available, medications/allergies/relevent history reviewed, required imaging and test results available.  Performed ° °Patient placed on cardiac monitor, pulse oximetry, supplemental oxygen as necessary.   °Sedation given: propofol IV, Dr. Adams °Pacer pads placed anterior and posterior chest. ° ° °Cardioverted 1 time(s).   °Cardioverted at  150 J. Synchronized biphasic °Converted to NSR ° ° °Evaluation: °Findings: Post procedure EKG shows: NSR °Complications: None °Patient did tolerate procedure well. ° °Time Spent Directly with the Patient: ° °45 minutes  ° °[name], M.D., Ph.D.  °

## 2024-05-06 NOTE — Discharge Summary (Signed)
 Physician Discharge Summary   Patient: [name] MRN: [id number] DOB: 01-Jun-1941  Admit date:     05/03/2024  Discharge date: 05/06/24  Discharge Physician: Amaryllis Dare   PCP: Sowles, Krichna, MD   Recommendations at discharge:  Please obtain CBC and BMP and follow-up Follow-up with primary care provider Follow-up with cardiology  Discharge Diagnoses: Principal Problem:   Atrial fibrillation with RVR (HCC) Active Problems:   Chronic heart failure with preserved ejection fraction (HFpEF) (HCC)   Essential hypertension   Dyslipidemia   Hypothyroid   Shortness of breath   Persistent atrial fibrillation [facility])   Hospital Course: Taken from H&P.   [name] is a 83 y.o. female with medical history significant of PAF on amiodarone  and Eliquis  status post recent cardioversion on 04/23/2024, chronic  HFpEF, HTN, HLD, hypothyroidism, chronic LBBB, presented with palpitations, not chest pain.  She was found to be in A-fib with RVR, heart rate in 120-140s.  Labs and chest x-ray was without any acute abnormality.  Patient was started on amiodarone  infusion and cardiology was consulted.  7/13: Vitals stable, heart rate in 80-90s, TSH normal at 1.579, magnesium  and phosphorus normal, BMP with mild hyponatremia at 133 and rest unremarkable with potassium of 4.0, troponin remain negative.  Echocardiogram with low normal EF, indeterminate diastolic parameter and biatrial dilatation. Continuing amiodarone  infusion today and EP team will see her tomorrow.  7/14: Vital stable, heart rate in 90s, patient was found to have a pressure injury which is present on admission involving right leg-wound care is following. EP is planning repeat cardioversion tomorrow morning and also recommending increasing amiodarone  to 200 mg twice daily and outpatient follow-up to discuss ablation.  7/15: Hemodynamically stable, s/p successful DCCV and converted to NSR.  Patient remains stable and able to  ambulate after the procedure.  Cardiology increased home amiodarone  to 200 mg twice daily and they will follow-up closely as she most likely require an ablation which will be arranged as outpatient.  Patient will continue with the rest of her home medications and follow-up with her providers for further assistance.  Assessment and Plan: * Atrial fibrillation with RVR (HCC) Patient with history of recent DCCV.  Rate currently improved but remained in atrial fibrillation. S/p repeat DCCV with successful conversion to NSR. Cardiology increased home dose of amiodarone  to 200 mg twice daily outpatient follow-up to discuss about ablation. Patient has history of oversensitivity to beta-blocker, when she developed severe bradycardia and hypotension. Will hold off as needed metoprolol   - Continue with Eliquis    Chronic heart failure with preserved ejection fraction (HFpEF) (HCC) Repeat echocardiogram with similar low normal EF and indeterminate diastolic function, biatrial dilatation.  Clinically appears euvolemic. - Continue home Lasix  -Monitor volume status  Essential hypertension - Continue home Cardura  and amlodipine   Dyslipidemia - Continue Crestor   Hypothyroid TSH normal. -Continue home Synthroid   Consultants: Cardiology Procedures performed: DCCV Disposition: Home Diet recommendation:  Discharge Diet Orders (From admission, onward)     Start     Ordered   05/06/24 0000  Diet - low sodium heart healthy        05/06/24 1313           Cardiac diet DISCHARGE MEDICATION: Allergies as of 05/06/2024       Reactions   Chocolate Swelling   Penicillins Swelling, Rash   Has patient had a PCN reaction causing immediate rash, facial/tongue/throat swelling, SOB or lightheadedness with hypotension: Yes Has patient had a PCN reaction causing severe rash  involving mucus membranes or skin necrosis: No Has patient had a PCN reaction that required hospitalization: No Has patient had a  PCN reaction occurring within the last 10 years: No If all of the above answers are NO, then may proceed with Cephalosporin use.   Codeine Nausea And Vomiting   Erythromycin Itching, Swelling   Septra [sulfamethoxazole-trimethoprim] Swelling        Medication List     STOP taking these medications    oxyCODONE  5 MG immediate release tablet Commonly known as: Oxy IR/ROXICODONE        TAKE these medications    acetaminophen  650 MG CR tablet Commonly known as: TYLENOL  Take 650-1,300 mg by mouth every 8 (eight) hours as needed for pain.   amiodarone  200 MG tablet Commonly known as: PACERONE  Take 1 tablet (200 mg total) by mouth 2 (two) times daily. What changed: when to take this   amLODipine  5 MG tablet Commonly known as: NORVASC  TAKE 1 TABLET(5 MG) BY MOUTH DAILY   busPIRone  5 MG tablet Commonly known as: BUSPAR  Take 1 tablet (5 mg total) by mouth 3 (three) times daily. What changed: when to take this   cholecalciferol  1000 units tablet Commonly known as: VITAMIN D  Take 2,000 Units by mouth daily.   Cranberry 500 MG Tabs Take 2 tablets by mouth daily.   doxazosin  1 MG tablet Commonly known as: CARDURA  Take 1 tablet (1 mg total) by mouth daily.   Eliquis  2.5 MG Tabs tablet Generic drug: apixaban  TAKE 1 TABLET(2.5 MG) BY MOUTH TWICE DAILY   furosemide  20 MG tablet Commonly known as: LASIX  TAKE 1 TABLET(20 MG) BY MOUTH DAILY   levothyroxine  137 MCG tablet Commonly known as: SYNTHROID  TAKE 1 TABLET(137 MCG) BY MOUTH DAILY BEFORE BREAKFAST   loratadine  10 MG tablet Commonly known as: CLARITIN  Take 10 mg by mouth daily as needed for allergies or rhinitis.   Melatonin 10 MG Tabs Take 10 mg by mouth at bedtime as needed.   multivitamin with minerals Tabs tablet Take 1 tablet by mouth daily.   potassium chloride  10 MEQ tablet Commonly known as: KLOR-CON  TAKE 1 TABLET BY MOUTH EVERY DAY AS DIRECTED. MAY TAKE AN EXTRA AFTER LUNCH WITH FLUID PILL AS  NEEDED FOR SWELLING   rosuvastatin  40 MG tablet Commonly known as: CRESTOR  Take 1 tablet (40 mg total) by mouth daily.   temazepam  15 MG capsule Commonly known as: RESTORIL  Take 1 capsule (15 mg total) by mouth at bedtime.               Discharge Care Instructions  (From admission, onward)           Start     Ordered   05/06/24 0000  Discharge wound care:       Comments: Right leg - Clean the skin with Vashe #[id number]. Not rinse. Try to take off the peeling skin surrounding the wound gently. Apply Aquacel [id number] on the wound bed, cover with silicon foam dressing. Change every 3 days or PRN.   05/06/24 1313            Follow-up Information     Sowles, Krichna, MD. Schedule an appointment as soon as possible for a visit in 1 week(s).   Specialty: Family Medicine Contact information: [address] [place] [address] [phone]         Perla Evalene PARAS, MD. Schedule an appointment as soon as possible for a visit in 1 week(s).   Specialty: Cardiology Contact information:  [address] [place] [address] [phone]                Discharge Exam: [name]   05/03/24 1057  Weight: 58 kg   General.  Frail elderly lady, in no acute distress. Pulmonary.  Lungs clear bilaterally, normal respiratory effort. CV.  Regular rate and rhythm, no JVD, rub or murmur. Abdomen.  Soft, nontender, nondistended, BS positive. CNS.  Alert and oriented .  No focal neurologic deficit. Extremities.  No edema, pulses intact and symmetrical. Psychiatry.  Judgment and insight appears normal.   Condition at discharge: stable  The results of significant diagnostics from this hospitalization (including imaging, microbiology, ancillary and laboratory) are listed below for reference.   Imaging Studies: ECHOCARDIOGRAM COMPLETE Result Date: 05/04/2024    ECHOCARDIOGRAM REPORT   Patient Name:   [name] Date of Exam: 05/03/2024  Medical Rec #:  [id number]      Height:       66.0 in Accession #:    [id number]     Weight:       127.9 lb Date of Birth:  Nov 19, 1940     BSA:          1.654 m Patient Age:    82 years       BP:           126/68 mmHg Patient Gender: F              HR:           100 bpm. Exam Location:  [facility] Procedure: 2D Echo, 3D Echo, Color Doppler, Cardiac Doppler and Strain Analysis            (Both Spectral and Color Flow Doppler were utilized during            procedure). Indications:     Atrial Fibrillation  History:         Patient has prior history of Echocardiogram examinations, most                  recent 07/24/2021. CHF, Arrythmias:LBBB and SVT; Risk                  Factors:Dyslipidemia. Cancer, CKD.  Sonographer:     Logan Shove RDCS Referring Phys:  [id number] REDELL CAVE Diagnosing Phys: REDELL CAVE MD IMPRESSIONS  1. Left ventricular ejection fraction, by estimation, is 50 to 55%. The left ventricle has low normal function. The left ventricle has no regional wall motion abnormalities. There is mild left ventricular hypertrophy. Left ventricular diastolic parameters are indeterminate.  2. Right ventricular systolic function is normal. The right ventricular size is normal.  3. Left atrial size was moderately dilated.  4. Right atrial size was mild to moderately dilated.  5. The mitral valve is normal in structure. Mild mitral valve regurgitation.  6. Tricuspid valve regurgitation is mild to moderate.  7. The aortic valve is tricuspid. Aortic valve regurgitation is not visualized.  8. The inferior vena cava is normal in size with greater than 50% respiratory variability, suggesting right atrial pressure of 3 mmHg. FINDINGS  Left Ventricle: Left ventricular ejection fraction, by estimation, is 50 to 55%. The left ventricle has low normal function. The left ventricle has no regional wall motion abnormalities. The left ventricular internal cavity size was normal in size. There is mild left ventricular hypertrophy.  Left ventricular diastolic parameters are indeterminate. Right Ventricle: The right ventricular size is normal. No increase in  right ventricular wall thickness. Right ventricular systolic function is normal. Left Atrium: Left atrial size was moderately dilated. Right Atrium: Right atrial size was mild to moderately dilated. Pericardium: There is no evidence of pericardial effusion. Mitral Valve: The mitral valve is normal in structure. Mild mitral valve regurgitation. Tricuspid Valve: The tricuspid valve is normal in structure. Tricuspid valve regurgitation is mild to moderate. Aortic Valve: The aortic valve is tricuspid. Aortic valve regurgitation is not visualized. Aortic valve mean gradient measures 2.0 mmHg. Aortic valve peak gradient measures 3.9 mmHg. Aortic valve area, by VTI measures 2.31 cm. Pulmonic Valve: The pulmonic valve was normal in structure. Pulmonic valve regurgitation is mild. Aorta: The aortic root and ascending aorta are structurally normal, with no evidence of dilitation. Venous: The inferior vena cava is normal in size with greater than 50% respiratory variability, suggesting right atrial pressure of 3 mmHg. IAS/Shunts: No atrial level shunt detected by color flow Doppler.  LEFT VENTRICLE PLAX 2D LVIDd:         3.50 cm LVIDs:         3.20 cm LV PW:         1.60 cm LV IVS:        1.60 cm LVOT diam:     2.00 cm   3D Volume EF: LV SV:         35        3D EF:        32 % LV SV Index:   21        LV EDV:       103 ml LVOT Area:     3.14 cm  LV ESV:       70 ml                          LV SV:        34 ml RIGHT VENTRICLE          IVC RV Basal diam:  2.70 cm  IVC diam: 1.20 cm TAPSE (M-mode): 2.6 cm LEFT ATRIUM             Index        RIGHT ATRIUM           Index LA diam:        5.20 cm 3.14 cm/m   RA Area:     19.30 cm LA Vol (A2C):   69.0 ml 41.73 ml/m  RA Volume:   51.80 ml  31.33 ml/m LA Vol (A4C):   70.3 ml 42.51 ml/m LA Biplane Vol: 69.8 ml 42.21 ml/m  AORTIC VALVE AV Area (Vmax):     2.33 cm AV Area (Vmean):   2.45 cm AV Area (VTI):     2.31 cm AV Vmax:           98.50 cm/s AV Vmean:          63.933 cm/s AV VTI:            0.150 m AV Peak Grad:      3.9 mmHg AV Mean Grad:      2.0 mmHg LVOT Vmax:         73.07 cm/s LVOT Vmean:        49.867 cm/s LVOT VTI:          0.110 m LVOT/AV VTI ratio: 0.74  AORTA Ao Root diam: 2.70 cm Ao Asc diam:  3.00 cm MITRAL VALVE  TRICUSPID VALVE MV Area (PHT): 3.40 cm    TR Peak grad:   27.2 mmHg MV Decel Time: 223 msec    TR Vmax:        261.00 cm/s MV E velocity: 81.00 cm/s                            SHUNTS                            Systemic VTI:  0.11 m                            Systemic Diam: 2.00 cm Redell Cave MD Electronically signed by Redell Cave MD Signature Date/Time: 05/04/2024/12:25:12 PM    Final    DG Chest 2 View Result Date: 05/03/2024 CLINICAL DATA:  Palpitations and chest pressure. EXAM: CHEST - 2 VIEW COMPARISON:  04/23/2024. FINDINGS: The heart is enlarged the mediastinal contour stable. There is atherosclerotic calcification of the aorta. No consolidation, effusion, or pneumothorax is seen. No acute osseous abnormality. IMPRESSION: No active cardiopulmonary disease. Electronically Signed   By: Leita Birmingham M.D.   On: 05/03/2024 11:45   DG Chest 2 View Result Date: 04/23/2024 CLINICAL DATA:  Chest pain. EXAM: CHEST - 2 VIEW COMPARISON:  04/05/2023. FINDINGS: Bilateral lung fields are clear. Bilateral costophrenic angles are clear. Stable cardio-mediastinal silhouette. No acute osseous abnormalities. Partially seen lower cervical spinal fixation hardware. The soft tissues are within normal limits. IMPRESSION: No active cardiopulmonary disease. Electronically Signed   By: Ree Molt M.D.   On: 04/23/2024 09:53    Microbiology: Results for orders placed or performed during the hospital encounter of 04/05/23  SARS Coronavirus 2 by RT PCR (hospital order, performed in [facility] hospital lab) *cepheid  single result test* Anterior Nasal Swab     Status: None   Collection Time: 04/05/23  8:26 AM   Specimen: Anterior Nasal Swab  Result Value Ref Range Status   SARS Coronavirus 2 by RT PCR NEGATIVE NEGATIVE Final    Comment: (NOTE) SARS-CoV-2 target nucleic acids are NOT DETECTED.  The SARS-CoV-2 RNA is generally detectable in upper and lower respiratory specimens during the acute phase of infection. The lowest concentration of SARS-CoV-2 viral copies this assay can detect is 250 copies / mL. A negative result does not preclude SARS-CoV-2 infection and should not be used as the sole basis for treatment or other patient management decisions.  A negative result may occur with improper specimen collection / handling, submission of specimen other than nasopharyngeal swab, presence of viral mutation(s) within the areas targeted by this assay, and inadequate number of viral copies (<250 copies / mL). A negative result must be combined with clinical observations, patient history, and epidemiological information.  Fact Sheet for Patients:   RoadLapTop.co.za  Fact Sheet for Healthcare Providers: http://kim-miller.com/  This test is not yet approved or  cleared by the [place]  FDA and has been authorized for detection and/or diagnosis of SARS-CoV-2 by FDA under an Emergency Use Authorization (EUA).  This EUA will remain in effect (meaning this test can be used) for the duration of the COVID-19 declaration under Section 564(b)(1) of the Act, 21 U.S.C. section 360bbb-3(b)(1), unless the authorization is terminated or revoked sooner.  Performed at [facility], [address] [place], [place] [address]     Labs: CBC:  Recent Labs  Lab 05/03/24 1103  WBC 8.5  HGB 13.9  HCT 40.9  MCV 94.9  PLT 240   Basic Metabolic Panel: Recent Labs  Lab 05/03/24 1103 05/03/24 1257 05/04/24 0449  NA 136  --  133*  K 4.0  --  4.0   CL 101  --  100  CO2 27  --  25  GLUCOSE 169*  --  135*  BUN 15  --  16  CREATININE 0.81  --  0.81  CALCIUM  9.3  --  9.1  MG  --  2.1  --   PHOS  --  2.9  --    Liver Function Tests: No results for input(s): AST, ALT, ALKPHOS, BILITOT, PROT, ALBUMIN in the last 168 hours. CBG: No results for input(s): GLUCAP in the last 168 hours.  Discharge time spent: greater than 30 minutes.  This record has been created using Conservation officer, historic buildings. Errors have been sought and corrected,but may not always be located. Such creation errors do not reflect on the standard of care.   Signed: Amaryllis Dare, MD [facility] 05/06/2024

## 2024-05-06 NOTE — Anesthesia Preprocedure Evaluation (Signed)
 Anesthesia Evaluation  Patient identified by MRN, date of birth, ID band Patient awake    Reviewed: Allergy & Precautions, H&P , NPO status , Patient's Chart, lab work & pertinent test results, reviewed documented beta blocker date and time   Airway Mallampati: II   Neck ROM: full    Dental  (+) Poor Dentition   Pulmonary neg pulmonary ROS   Pulmonary exam normal        Cardiovascular Exercise Tolerance: Poor hypertension, On Medications + Peripheral Vascular Disease, +CHF, + PND and + DOE  + dysrhythmias Atrial Fibrillation  Rhythm:regular Rate:Normal     Neuro/Psych  PSYCHIATRIC DISORDERS Anxiety Depression     Neuromuscular disease    GI/Hepatic negative GI ROS, Neg liver ROS,,,  Endo/Other  Hypothyroidism    Renal/GU Renal disease  negative genitourinary   Musculoskeletal   Abdominal   Peds  Hematology negative hematology ROS (+)   Anesthesia Other Findings Past Medical History: No date: Anxiety No date: Bronchitis No date: Cancer [facility])     Comment:  melanoma to RLE No date: Chronic combined systolic (congestive) and diastolic  (congestive) heart failure (HCC)     Comment:  a. 02/2018 Echo: EF 45-50%, diff HK, triv AI, mod MR,               mildly dil LA/RA, nl RV fxn, mod TR. PASP 40-53mmHg. No date: Chronic kidney disease No date: DJD (degenerative joint disease), cervical No date: History of stress test     Comment:  a. 01/2008 MV: EF 76%. Fair ex tol. No ischemia. No date: Hyperlipidemia No date: Hypertension No date: Hypothyroid No date: LBBB (left bundle branch block) No date: Leg pain     Comment:  a. 01/2018 ABI's wnl. No date: Lumbar spondylosis No date: Persistent atrial fibrillation (HCC)     Comment:  a. 01/2018 Event monitor: 45 runs of SVT, longest 14.5               sec. SVT felt to be Afib/flutter-->2% burden. Longest run              of AF 4h 68m (CHA2DS2VASc = 5-->Xarelto ); b. 03/2019  s/p               DCCV; c. 04/2019 Repeat Amio load and DCCV. 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 Past Surgical History: 07/2019: BACK SURGERY 03/26/2018: CARDIOVERSION; N/A     Comment:  Procedure: CARDIOVERSION;  Surgeon: Mady Bruckner,               MD;  Location: [facility] ORS;  Service: Cardiovascular;                Laterality: N/A; 04/24/2018: CARDIOVERSION; N/A     Comment:  Procedure: CARDIOVERSION;  Surgeon: Perla Evalene PARAS,               MD;  Location: [facility] ORS;  Service: Cardiovascular;                Laterality: N/A; 07/12/2021: CATARACT EXTRACTION W/PHACO; Right     Comment:  Procedure: CATARACT EXTRACTION PHACO AND INTRAOCULAR               LENS PLACEMENT (IOC) RIGHT 16.92 01:30.8 ;  Surgeon:               Jaye Fallow, MD;  Location: [facility];  Service: Ophthalmology;  Laterality: Right; 02/27/2012: ELECTROPHYSIOLOGY STUDY; N/A     Comment:  Procedure: ELECTROPHYSIOLOGY STUDY;  Surgeon: Danelle LELON Birmingham, MD;  Location: [facility] CATH LAB;  Service:               Cardiovascular;  Laterality: N/A; 5/13: EPS and ablation for SVT     Comment:  slow pathway ablation by Dr Birmingham No date: EYE SURGERY     Comment:  surgery for detatched retina 02/2020: NECK SURGERY 02/27/2012: SUPRAVENTRICULAR TACHYCARDIA ABLATION; N/A     Comment:  Procedure: SUPRAVENTRICULAR TACHYCARDIA ABLATION;                Surgeon: Danelle LELON Birmingham, MD;  Location: [facility] C[facility] LAB;                Service: Cardiovascular;  Laterality: N/A; 10/19/2021: TEE WITHOUT CARDIOVERSION; N/A     Comment:  Procedure: TRANSESOPHAGEAL ECHOCARDIOGRAM (TEE);                Surgeon: Darliss Rogue, MD;  Location: [facility] ORS;                Service: Cardiovascular;  Laterality: N/A; BMI    Body Mass Index: 20.64 kg/m     Reproductive/Obstetrics negative OB ROS                               Anesthesia Physical Anesthesia Plan  ASA: 4  Anesthesia Plan: General   Post-op Pain Management:    Induction:   PONV Risk Score and Plan:   Airway Management Planned:   Additional Equipment:   Intra-op Plan:   Post-operative Plan:   Informed Consent: I have reviewed the patients History and Physical, chart, labs and discussed the procedure including the risks, benefits and alternatives for the proposed anesthesia with the patient or authorized representative who has indicated his/her understanding and acceptance.     Dental Advisory Given  Plan Discussed with: CRNA  Anesthesia Plan Comments:         Anesthesia Quick Evaluation

## 2024-05-06 NOTE — Plan of Care (Signed)

## 2024-05-07 ENCOUNTER — Encounter: Payer: Self-pay | Admitting: Cardiovascular Disease

## 2024-05-07 NOTE — Anesthesia Postprocedure Evaluation (Signed)
 Anesthesia Post Note  Patient: [name]  Procedure(s) Performed: CARDIOVERSION  Patient location during evaluation: PACU Anesthesia Type: General Level of consciousness: awake and alert Pain management: pain level controlled Vital Signs Assessment: post-procedure vital signs reviewed and stable Respiratory status: spontaneous breathing, nonlabored ventilation, respiratory function stable and patient connected to nasal cannula oxygen Cardiovascular status: blood pressure returned to baseline and stable Postop Assessment: no apparent nausea or vomiting Anesthetic complications: no   No notable events documented.   Last Vitals:  Vitals:   05/06/24 0845 05/06/24 0919  BP: (!) 108/43 (!) 120/50  Pulse: 60 60  Resp: 15 18  Temp: 36.6 C 37 C  SpO2: 97% 99%    Last Pain:  Vitals:   05/06/24 1022  TempSrc:   PainSc: 6                  Lyn[place] KANDICE Clause

## 2024-05-13 ENCOUNTER — Encounter: Payer: Self-pay | Admitting: Medical

## 2024-05-13 ENCOUNTER — Ambulatory Visit: Attending: Medical | Admitting: Medical

## 2024-05-13 VITALS — BP 160/60 | HR 62 | Ht 66.0 in | Wt 128.0 lb

## 2024-05-13 DIAGNOSIS — I480 Paroxysmal atrial fibrillation: Secondary | ICD-10-CM | POA: Diagnosis not present

## 2024-05-13 NOTE — Progress Notes (Signed)
 Cardiology Office Note   Date:  05/13/2024  ID:  [name], DOB 1941/08/08, MRN [id number] PCP: Glenard Mire, MD  [facility] Providers Cardiologist:  Evalene Lunger, MD     History of Present Illness [name] is a 83 y.o. female with a history of AVNRT status post catheter ablation, PAF status post multiple cardioversions, combined systolic and diastolic heart failure, orthostasis, chronic left bundle branch block, hypertension, hyperlipidemia, degenerative joint disease, leg pain with abnormal ABIs in 2018, mild carotid disease who presents for hospital follow-up.  Heart monitor from April 2019 showed predominantly normal sinus rhythm with an average heart rate of 66, bundle branch block/IVCD, 45 runs of SVT longest episode 14.5 seconds, possible A-fib flutter, A-fib occurred at 2% burden, secondary AV block type I.  Patient was placed on Xarelto .  At follow-up in May 2019 she was in A-fib RVR despite beta-blocker and calcium  channel blocker therapy.  She was placed on amiodarone  with plans for outpatient cardioversion.  However, she was admitted for CHF exacerbation in the setting of A-fib RVR in 03/2018.  She was cardioverted at that time.  She had recurrent A-fib and heart failure in 04/2018 that required repeat amiodarone  and cardioversion.  She was admitted to the hospital 10/2020 with acute respiratory failure hypoxia in the context of multifocal pneumonia treated with antibiotics, steroids, and nebulizers.  Echocardiogram revealed LVEF of 60-65%, no RWMA, G1 DD, normal RV systolic function and ventricular cavity size, and mild to moderate mitral valve regurgitation.   She was admitted to the hospital 07/2021 with near syncope and fall/hypotension.  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  Cardiac enzymes are negative.  BNP 145.  Carotid artery ultrasound showed no hemodynamically significant stenosis bilaterally.  Echocardiogram showed an LVEF of 55  to 60%, no RWMA, moderate LVH, G1 DD, normal LV systolic function and ventricular cavity size, normal PASP, mild mitral regurgitation, mild to moderate tricuspid regurgitation, mild aortic valve insufficiency mild to moderate aortic valve sclerosis without evidence of stenosis.  She was evaluated by EP with symptoms going to be related to known orthostatic intolerance and exacerbated by pain.  Given this was not abrupt onset and offset it was felt to be unlikely contributing with recommendations for loop recorder if symptoms recur.  EP recommended discontinuation of metoprolol .  At the time of discharge primary service held amiodarone    She was seen in follow-up 08/03/2021 is doing well from a cardiac perspective.  She denied having further presyncopal episodes.  Amiodarone  was restarted in an effort to maintain sinus rhythm.  It was recommended that she continue to hold metoprolol  given prior bradycardia and first-degree AV block.   She was seen in clinic 09/2021 doing well from a cardiac perspective.  No near syncopal episodes.  Blood pressure log shows readings typically around 130s to 150s.  She does note some lightheadedness with her blood pressure gets around 115 systolic.  She has tolerated low-dose amlodipine  2.5 mg daily without issues and remained on amiodarone  and apixaban .  It was recommended she follow back up with EP for consideration of loop recorder, she was sent for updated labs.  She was scheduled for TEE to further evaluate her mitral regurgitation.  Continued on GDMT as tolerated.   She underwent TEE 10/19/2021 which revealed mitral regurgitation was mild, no ASD or VSD, no PFO, the LA was well-visualized no spontaneous contrast and no thrombus in the LAA or LA appendage.  The descending  thoracic aorta had no mural aortic debris with no evidence of aneurysmal dilatation or dissection.   She was evaluated in clinic 10/27/2021.  She had previously stopped her blood pressure medications due to  blood pressure dropping low.  Amlodipine  was increased to 5 mg daily and she was continuing to check her blood pressures at home.  She was also continued on apixaban  and considering referral to EP for loop recorder.   She was seen in clinic 01/01/2024 by Dr. Gollan.  At that point in time she was doing well.  She was continued on Eliquis  and amiodarone .  No medication changes were made and further testing was ordered.  Patient was seen in the ER 05/03/24 for A-fib RVR reporting dizziness.  She reported compliance with Eliquis .  Cardiology was called who recommended cardioversion.  Patient underwent successful cardioversion in the ER and was discharged home.  Patient was seen in our clinic 04/30/2024 and was feeling well since the ER visit.  Patient was in normal sinus rhythm.  Patient presented to the hospital 05/03/2024 with palpitations found to be in A-fib RVR with heart rates 1 20-1 40s.  Labs and chest x-ray were unremarkable.  She was started on amiodarone  infusion.  EP was asked to see who recommended cardioversion with outpatient follow-up to discuss ablation.  Today, the patient is in NSR. She is overall doing well. She does have some generalized fatigue. She reports compliance with Eliquis . She is taking amiodarone  200mg  BID. She denies chest pain, SOB, lower leg edema.  This AM BP was 136/64 (reported by patient) Repeat BP 160/60.  Studies Reviewed EKG Interpretation Date/Time:  Tuesday May 13 2024 13:52:06 EDT Ventricular Rate:  62 PR Interval:  202 QRS Duration:  166 QT Interval:  492 QTC Calculation: 499 R Axis:   -53  Text Interpretation: Normal sinus rhythm Left axis deviation Left bundle branch block When compared with ECG of 06-May-2024 07:54, PREVIOUS ECG IS PRESENT Confirmed by Franchester, Yaminah Clayborn ([id number]) on 05/13/2024 1:58:00 PM    Echo 04/2024  1. Left ventricular ejection fraction, by estimation, is 50 to 55%. The  left ventricle has low normal function. The left ventricle has  no regional  wall motion abnormalities. There is mild left ventricular hypertrophy.  Left ventricular diastolic  parameters are indeterminate.   2. Right ventricular systolic function is normal. The right ventricular  size is normal.   3. Left atrial size was moderately dilated.   4. Right atrial size was mild to moderately dilated.   5. The mitral valve is normal in structure. Mild mitral valve  regurgitation.   6. Tricuspid valve regurgitation is mild to moderate.   7. The aortic valve is tricuspid. Aortic valve regurgitation is not  visualized.   8. The inferior vena cava is normal in size with greater than 50%  respiratory variability, suggesting right atrial pressure of 3 mmHg   2D echo 07/24/2021: 1. Left ventricular ejection fraction, by estimation, is 55 to 60%. The  left ventricle has normal function. The left ventricle has no regional  wall motion abnormalities. There is moderate left ventricular hypertrophy.  Left ventricular diastolic  parameters are consistent with Grade I diastolic dysfunction (impaired  relaxation).   2. Right ventricular systolic function is normal. The right ventricular  size is normal. There is normal pulmonary artery systolic pressure.   3. The mitral valve is abnormal. Mild mitral valve regurgitation. No  evidence of mitral stenosis.   4. Tricuspid valve regurgitation is mild to moderate.  5. The aortic valve is tricuspid. There is mild calcification of the  aortic valve. There is mild thickening of the aortic valve. Aortic valve  regurgitation is mild. Mild to moderate aortic valve  sclerosis/calcification is present, without any evidence  of aortic stenosis.   6. The inferior vena cava is normal in size with greater than 50%  respiratory variability, suggesting right atrial pressure of 3 mmHg.   2D echo 10/31/2020: 1. Left ventricular ejection fraction, by estimation, is 60 to 65%. The  left ventricle has normal function. The left ventricle  has no regional  wall motion abnormalities. Left ventricular diastolic parameters are  consistent with Grade I diastolic  dysfunction (impaired relaxation).   2. Right ventricular systolic function is normal. The right ventricular  size is normal. There is moderately elevated pulmonary artery systolic  pressure. The estimated right ventricular systolic pressure is 54.8 mmHg.   3. The mitral valve is normal in structure. Mild to moderate mitral valve  regurgitation. Two jets. No evidence of mitral stenosis.   2D echo 03/21/2018: - Left ventricle: The cavity size was normal. Wall thickness was    increased in a pattern of mild LVH. Systolic function was mildly    reduced. The estimated ejection fraction was in the range of 45%    to 50%. Diffuse hypokinesis.  - Aortic valve: There was trivial regurgitation.  - Mitral valve: Mildly calcified annulus. There was moderate    regurgitation directed posteriorly.  - Left atrium: The atrium was mildly dilated.  - Right ventricle: The cavity size was normal. Systolic function    was normal.  - Right atrium: The atrium was mildly dilated.  - Tricuspid valve: There was moderate regurgitation.  - Pulmonary arteries: Systolic pressure was mildly to moderately    increased, in the range of 40 mm Hg to 45 mm Hg.  - Inferior vena cava: The vessel was normal in size. The    respirophasic diameter changes were blunted (< 50%), consistent    with elevated central venous pressure.  - Pericardium, extracardiac: A trivial pericardial effusion was    identified.    Zio patch 01/2018: Normal sinus rhythm min HR of 30 bpm, max HR of 179 bpm, and avg HR of 66 bpm.   Bundle Branch Block/IVCD was present.   45 Supraventricular Tachycardia runs occurred, the run with the fastest interval lasting 13 beats with a max rate of 179 bpm, the longest lasting 14.5 secs with an avg rate of 96 bpm.    Some Episodes of Supraventricula Tachycardia May be short burst of  Atrial fibrillation/Flutter.    Atrial Fibrillation occurred (2% burden), ranging from 56-129 bpm (avg of 79 bpm), the longest lasting 4 hours 24 mins with an avg rate of 79 bpm.    Second Degree AV Block-Mobitz I (Wenckebach) was present.    Atrial Fibrillation was detected within +/- 45 seconds of symptomatic patient event(s).   Other patient triggers with no arrhythmia noted   Isolated SVEs were rare (<1.0%), and no SVE Couplets or SVE Triplets were present. Isolated VEs were frequent (7.9%, 81203), VE Couplets were rare (<1.0%, 1332), and VE Triplets were rare (<1.0%, 71). Ventricular Bigeminy and Trigeminy were Present.   Lexiscan MPI 02/01/2008: 1.     Fair exercise tolerance with no arrhythmia or ischemia.  2.     Normal LV function at 76% with no transient LV dilatation or  regional  wall motion abnormality.  3.     Fixed  septal defect consistent with LEFT bundle branch block. No  reversible ischemia noted.   HYPERTENSION CONTROL Vitals:   05/13/24 1346 05/13/24 1511  BP: (!) 175/68 (!) 160/60    The patient's blood pressure is elevated above target today.  In order to address the patient's elevated BP:           Physical Exam VS:  BP (!) 160/60   Pulse 62   Ht 5' 6 (1.676 m)   Wt 128 lb (58.1 kg)   SpO2 98%   BMI 20.66 kg/m        Wt Readings from Last 3 Encounters:  05/13/24 128 lb (58.1 kg)  05/03/24 127 lb 13.9 oz (58 kg)  04/30/24 127 lb 6.4 oz (57.8 kg)    GEN: Well nourished, well developed in no acute distress NECK: No JVD; No carotid bruits CARDIAC: RRR, no murmurs, rubs, gallops RESPIRATORY:  Clear to auscultation without rales, wheezing or rhonchi  ABDOMEN: Soft, non-tender, non-distended EXTREMITIES:  No edema; No deformity   ASSESSMENT AND PLAN  Persistent Afib 2 recent hospital visits for Afib RVR s/p DCCV x 2 on amiodarone . She was seen by EP who recommended amiodarone  200mg BID with repeat DCCV and follow-up to discuss ablation. She  failed amiodarone  200mg  daily. Today, she is in NSR with 1st degree AV block. She reports compliance with Eliquis  2.5mg BID. She has appointment with Dr. Kennyth next month.   Chronic HFpEF The patient is euvolemic on exam. Continue lasix  20mg  daily.   HTN BP is elevated, repeat BP 160/60. She reports BP this AM was 136/64. Continue amlodipine  5mg  daily and Cardura  1mg  daily.        Dispo: Follow-up with EP next month  Signed, Charda Janis VEAR Fishman, PA-C

## 2024-05-13 NOTE — Patient Instructions (Signed)
 Medication Instructions:  Your physician recommends that you continue on your current medications as directed. Please refer to the Current Medication list given to you today.   *If you need a refill on your cardiac medications before your next appointment, please call your pharmacy*  Lab Work: No labs ordered today  If you have labs (blood work) drawn today and your tests are completely normal, you will receive your results only by: MyChart Message (if you have MyChart) OR A paper copy in the mail If you have any lab test that is abnormal or we need to change your treatment, we will call you to review the results.  Testing/Procedures: No test ordered today   Follow-Up: At [facility], you and your health needs are our priority.  As part of our continuing mission to provide you with exceptional heart care, our providers are all part of one team.  This team includes your primary Cardiologist (physician) and Advanced Practice Providers or APPs (Physician Assistants and Nurse Practitioners) who all work together to provide you with the care you need, when you need it.  Your next appointment:   Keep follow up as scheduled  Provider:   Timothy Gollan, MD or Cadence Franchester, PA-C

## 2024-06-01 NOTE — Progress Notes (Signed)
 Electrophysiology Office Note:   Date:  06/02/2024  ID:  SKYRA CRICHLOW, DOB [birth year], MRN [id number]  Primary Cardiologist: Evalene Lunger, MD Electrophysiologist: Fonda Kitty, MD      History of Present Illness:   [name] is a 83 y.o. female with h/o history of atrial fibrillation and sinus bradycardia, AVNRT s/p ablation in 2013, chronic diastolic heart failure who is being seen today for evaluation of her atrial fibrillation.   Discussed the use of AI scribe software for clinical note transcription with the patient, who gave verbal consent to proceed.  History of Present Illness [name] is an 83 year old female with atrial fibrillation who presents for follow-up regarding her medication management and potential ablation. Patient recently admitted 7/12 through 7/15 for atrial fibrillation with rapid ventricular rates.  She has a history of atrial fibrillation and has been on amiodarone  for approximately eight years. Recently, her dose was doubled due to increased episodes of atrial fibrillation. She experiences occasional episodes of atrial fibrillation lasting less than two minutes and sometimes feels a fluttering sensation. She monitors her condition using a finger pulse oximeter and notes that her heart rate occasionally increases during blood pressure checks. She is also on Eliquis .  She is under surveillance for a condition being monitored at a cancer center. She recently underwent a biopsy, which was okay, and is scheduled for a PET scan and CT scan next week. She visits the cancer center every three months for surveillance imaging.   Review of systems complete and found to be negative unless listed in HPI.   EP Information / Studies Reviewed:    EKG is ordered today. Personal review as below.  EKG Interpretation Date/Time:  Monday June 02 2024 11:22:17 EDT Ventricular Rate:  63 PR Interval:  224 QRS Duration:  168 QT Interval:  498 QTC  Calculation: 509 R Axis:   -60  Text Interpretation: Sinus rhythm with sinus arrhythmia with 1st degree A-V block Left axis deviation Left bundle branch block When compared with ECG of 13-May-2024 13:52, No significant change was found Confirmed by Kitty Fonda [phone]) on 06/02/2024 6:33:11 PM   Echo 04/2024:   1. Left ventricular ejection fraction, by estimation, is 50 to 55%. The  left ventricle has low normal function. The left ventricle has no regional  wall motion abnormalities. There is mild left ventricular hypertrophy.  Left ventricular diastolic  parameters are indeterminate.   2. Right ventricular systolic function is normal. The right ventricular  size is normal.   3. Left atrial size was moderately dilated.   4. Right atrial size was mild to moderately dilated.   5. The mitral valve is normal in structure. Mild mitral valve  regurgitation.   6. Tricuspid valve regurgitation is mild to moderate.   7. The aortic valve is tricuspid. Aortic valve regurgitation is not  visualized.   8. The inferior vena cava is normal in size with greater than 50%  respiratory variability, suggesting right atrial pressure of 3 mmHg.   Risk Assessment/Calculations:    CHA2DS2-VASc Score = 6   This indicates a 9.7% annual risk of stroke. The patient's score is based upon: CHF History: 1 HTN History: 1 Diabetes History: 0 Stroke History: 0 Vascular Disease History: 1 Age Score: 2 Gender Score: 1         Physical Exam:   VS:  BP (!) 146/72 (BP Location: Left Arm, Patient Position: Sitting, Cuff Size: Normal)   Pulse 63   Ht  5' 6 (1.676 m)   Wt 125 lb 11.2 oz (57 kg)   SpO2 98%   BMI 20.29 kg/m    Wt Readings from Last 3 Encounters:  06/02/24 125 lb 11.2 oz (57 kg)  05/13/24 128 lb (58.1 kg)  05/03/24 127 lb 13.9 oz (58 kg)     GEN: Well nourished, well developed in no acute distress NECK: No JVD CARDIAC: Normal rate, regular rhythm RESPIRATORY:  Clear to auscultation  without rales, wheezing or rhonchi  ABDOMEN: Soft, non-distended EXTREMITIES:  1+ edema in RLE; No deformity   ASSESSMENT AND PLAN:    #Persistent atrial fibrillation: Associated with decompensated heart failure.  For this reason we have prioritized a rhythm control strategy.  Episodes continued to occur despite amiodarone  200 mg daily.  Now taking amiodarone  400 mg daily with some improvement. #High risk medication use: Amiodarone .  Currently taking a thyroid  supplement, follow-up with PCP. - Discussed treatment options today for AF.  She is already taking amiodarone  400 mg daily.  I am worried about long-term consequences of this high dose.  She already has history of thyroid  disease. Risk, benefits, and alternatives to EP study and ablation for afib were discussed. These risks include but are not limited to stroke, bleeding, vascular damage, tamponade, perforation, damage to the esophagus, lungs, phrenic nerve and other structures, pulmonary vein stenosis, worsening renal function, coronary vasospasm and death.  Discussed potential need for repeat ablation procedures and antiarrhythmic drugs after an initial ablation. The patient understands these risk and wishes to proceed.  We will therefore proceed with catheter ablation. We will wait to schedule after her follow-up with oncology later this month.  Carto, ICE, anesthesia are requested for the procedure.   We will obtain CT PV protocol prior to the procedure. -Continue amiodarone  200 mg twice daily until ablation.  We will plan to quickly wean amiodarone  after ablation.  #Hypercoagulable state due to AF:  - Continue Eliquis  5 mg twice daily.  #LBBB: Last EF normal. - Continue to monitor.  #Chronic diastolic HF: Well compensated on exam today.  She has chronic right lower extremity swelling related to her prior skin cancer resection. -Continue follow-up with general cardiologist, Dr. Gollan.  Continue Lasix  daily. -Rhythm control strategy as  above.   Follow up with Dr. Kennyth 3 months after ablation.   Signed, Fonda Kennyth, MD

## 2024-06-02 ENCOUNTER — Encounter: Payer: Self-pay | Admitting: Cardiology

## 2024-06-02 ENCOUNTER — Ambulatory Visit: Attending: Cardiology | Admitting: Cardiology

## 2024-06-02 ENCOUNTER — Other Ambulatory Visit: Payer: Self-pay

## 2024-06-02 VITALS — BP 146/72 | HR 63 | Ht 66.0 in | Wt 125.7 lb

## 2024-06-02 DIAGNOSIS — Z79899 Other long term (current) drug therapy: Secondary | ICD-10-CM

## 2024-06-02 DIAGNOSIS — I4819 Other persistent atrial fibrillation: Secondary | ICD-10-CM | POA: Diagnosis not present

## 2024-06-02 DIAGNOSIS — D6869 Other thrombophilia: Secondary | ICD-10-CM

## 2024-06-02 DIAGNOSIS — I447 Left bundle-branch block, unspecified: Secondary | ICD-10-CM

## 2024-06-02 DIAGNOSIS — I5032 Chronic diastolic (congestive) heart failure: Secondary | ICD-10-CM | POA: Diagnosis not present

## 2024-06-02 NOTE — Patient Instructions (Signed)
 Medication Instructions:  Your physician recommends that you continue on your current medications as directed. Please refer to the Current Medication list given to you today.  *If you need a refill on your cardiac medications before your next appointment, please call your pharmacy*  Testing/Procedures: Ablation Your physician has recommended that you have an ablation. Catheter ablation is a medical procedure used to treat some cardiac arrhythmias (irregular heartbeats). During catheter ablation, a long, thin, flexible tube is put into a blood vessel in your groin (upper thigh), or neck. This tube is called an ablation catheter. It is then guided to your heart through the blood vessel. Radio frequency waves destroy small areas of heart tissue where abnormal heartbeats may cause an arrhythmia to start.    What To Expect:  Labs: you will need to have lab work drawn within 30 days of your procedure. Please go to any [facility] location to have these drawn - no appointment is needed. You will receive procedure instructions either through MyChart or in the mail 4-6 week prior to your procedure.  After your procedure we recommend no driving for 3 days, no lifting over 10 lbs for 5 days, and no work or strenuous activity for 7 days.  Please contact our office at [phone] if you have any questions.    We will call you to schedule your ablation.

## 2024-06-05 ENCOUNTER — Other Ambulatory Visit: Payer: Self-pay

## 2024-06-05 ENCOUNTER — Emergency Department
Admission: EM | Admit: 2024-06-05 | Discharge: 2024-06-05 | Disposition: A | Discharge status: Home / Self Care | Attending: Emergency Medicine | Admitting: Emergency Medicine

## 2024-06-05 ENCOUNTER — Emergency Department

## 2024-06-05 DIAGNOSIS — J40 Bronchitis, not specified as acute or chronic: Secondary | ICD-10-CM | POA: Insufficient documentation

## 2024-06-05 DIAGNOSIS — R059 Cough, unspecified: Secondary | ICD-10-CM | POA: Diagnosis present

## 2024-06-05 DIAGNOSIS — Z7901 Long term (current) use of anticoagulants: Secondary | ICD-10-CM | POA: Insufficient documentation

## 2024-06-05 DIAGNOSIS — J069 Acute upper respiratory infection, unspecified: Secondary | ICD-10-CM | POA: Diagnosis not present

## 2024-06-05 LAB — RESP PANEL BY RT-PCR (RSV, FLU A&B, COVID)  RVPGX2
Influenza A by PCR: NEGATIVE
Influenza B by PCR: NEGATIVE
Resp Syncytial Virus by PCR: NEGATIVE
SARS Coronavirus 2 by RT PCR: NEGATIVE

## 2024-06-05 MED ORDER — HYDROCOD POLI-CHLORPHE POLI ER 10-8 MG/5ML PO SUER
5.0000 mL | Freq: Once | ORAL | Status: AC
  Administered 2024-06-05: 5 mL via ORAL
  Filled 2024-06-05: qty 5

## 2024-06-05 MED ORDER — HYDROCOD POLI-CHLORPHE POLI ER 10-8 MG/5ML PO SUER: 5.0000 mL | Freq: Every evening | ORAL | 0 refills | Status: DC | PRN

## 2024-06-05 NOTE — ED Triage Notes (Signed)
 Pt reports cough and congestion for few days, pt reports tonight she felt worse.

## 2024-06-05 NOTE — Discharge Instructions (Addendum)
 No signs of pneumonia  Use the cough medicine at home, only 1-2 times per day, do not mix with additional cough medicines  If you have any worsening symptoms return to the ED

## 2024-06-05 NOTE — ED Provider Notes (Signed)
 [facility] Provider Note    Event Date/Time   First MD Initiated Contact with Patient 06/05/24 [phone]     (approximate)   History   Cough   HPI  [name] is a 83 y.o. female who presents to the ED for evaluation of Cough   Review a cardiology clinic visit from 3 days ago.  History of A-fib on amiodarone , Eliquis   Patient presents to the ED alongside her husband for evaluation of 3-4 days of upper respiratory congestion, nonproductive cough and a head cold.  She reports that she went to make sure that she did not have pneumonia and that she was okay.  No chest pain, shortness of breath, dizziness, syncope or falls.   Physical Exam   Triage Vital Signs: ED Triage Vitals  Encounter Vitals Group     BP 06/05/24 0250 (!) 172/62     Girls Systolic BP Percentile --      Girls Diastolic BP Percentile --      Boys Systolic BP Percentile --      Boys Diastolic BP Percentile --      Pulse Rate 06/05/24 0250 65     Resp 06/05/24 0250 19     Temp 06/05/24 0253 99.1 F (37.3 C)     Temp src --      SpO2 06/05/24 0250 100 %     Weight 06/05/24 0249 124 lb (56.2 kg)     Height 06/05/24 0249 5' 6 (1.676 m)     Head Circumference --      Peak Flow --      Pain Score 06/05/24 0249 0     Pain Loc --      Pain Education --      Exclude from Growth Chart --     Most recent vital signs: Vitals:   06/05/24 0250 06/05/24 0253  BP: (!) 172/62   Pulse: 65   Resp: 19   Temp:  99.1 F (37.3 C)  SpO2: 100%     General: Awake, no distress.  CV:  Good peripheral perfusion.  Resp:  Normal effort.  No wheezing Abd:  No distention.  MSK:  No deformity noted.  Neuro:  No focal deficits appreciated. Other:     ED Results / Procedures / Treatments   Labs (all labs ordered are listed, but only abnormal results are displayed) Labs Reviewed  RESP PANEL BY RT-PCR (RSV, FLU A&B, COVID)  [id number]    EKG   RADIOLOGY CXR interpreted by me without  evidence of acute cardiopulmonary pathology.  Official radiology report(s): DG Chest Port 1 View Result Date: 06/05/2024 CLINICAL DATA:  83 year old female with cough and congestion. Hypertension and atrial fibrillation. EXAM: PORTABLE CHEST 1 VIEW COMPARISON:  Chest radiograph 05/03/2024 and earlier. FINDINGS: Portable AP upright view at 0313 hours. Chronic cardiomegaly, large lung volumes. Stable cardiac size and mediastinal contours. Visualized tracheal air column is within normal limits. Allowing for portable technique the lungs are clear. No pneumothorax or pleural effusion. Chronic cervical ACDF. No acute osseous abnormality identified. Paucity of bowel gas in the visible abdomen. IMPRESSION: Chronic cardiomegaly and possible chronic hyperinflation. No acute cardiopulmonary abnormality. Electronically Signed   By: VEAR Hurst M.D.   On: 06/05/2024 03:48    PROCEDURES and INTERVENTIONS:  Procedures  Medications  chlorpheniramine-HYDROcodone  (TUSSIONEX) 10-8 MG/5ML suspension 5 mL (has no administration in time range)     IMPRESSION / MDM / ASSESSMENT AND PLAN / ED COURSE  I  reviewed the triage vital signs and the nursing notes.  Differential diagnosis includes, but is not limited to, pneumonia, CHF exacerbation, viral URI, bronchitis  {Patient presents with symptoms of an acute illness or injury that is potentially life-threatening.  Patient presents with a few days of respiratory congestion and nonproductive cough consistent with a viral illness suitable for outpatient management.  No indications for antibiotics.  Discussed symptomatic measures and ED return precautions.      FINAL CLINICAL IMPRESSION(S) / ED DIAGNOSES   Final diagnoses:  Viral URI with cough  Bronchitis     Rx / DC Orders   ED Discharge Orders          Ordered    chlorpheniramine-HYDROcodone  (TUSSIONEX) 10-8 MG/5ML  At bedtime PRN        06/05/24 9367             Note:  This document was prepared  using Dragon voice recognition software and may include unintentional dictation errors.   Claudene Rover, MD 06/05/24 [phone]

## 2024-06-06 ENCOUNTER — Telehealth: Payer: Self-pay

## 2024-06-06 NOTE — Telephone Encounter (Signed)
 Pt states that she is feeling a little better and will call back to schedule if need.

## 2024-06-06 NOTE — Telephone Encounter (Signed)
 Patient will need to be seen if symptoms not better. Unable to give medication without being seen.

## 2024-06-06 NOTE — Telephone Encounter (Signed)
 Copied from CRM [id number]. Topic: Clinical - Medication Question >> Jun 06, 2024  8:08 AM Burnard DEL wrote: Reason for CRM: Patient was diagnosed with bronchitis at ED and was only prescribed cough medicine. Her daughter is wondering if she may need an antibiotic as we  [id number] [place], [place] - [address] AT [facility] OF [address] [facility]  Phone: [phone] Fax: [phone]

## 2024-07-10 ENCOUNTER — Encounter: Payer: Self-pay | Admitting: Family Medicine

## 2024-07-10 ENCOUNTER — Ambulatory Visit (INDEPENDENT_AMBULATORY_CARE_PROVIDER_SITE_OTHER): Admitting: Family Medicine

## 2024-07-10 VITALS — BP 152/70 | HR 80 | Resp 16 | Ht 66.0 in | Wt 122.4 lb

## 2024-07-10 DIAGNOSIS — Z23 Encounter for immunization: Secondary | ICD-10-CM | POA: Diagnosis not present

## 2024-07-10 DIAGNOSIS — N1831 Chronic kidney disease, stage 3a: Secondary | ICD-10-CM

## 2024-07-10 DIAGNOSIS — I5042 Chronic combined systolic (congestive) and diastolic (congestive) heart failure: Secondary | ICD-10-CM | POA: Diagnosis not present

## 2024-07-10 DIAGNOSIS — I480 Paroxysmal atrial fibrillation: Secondary | ICD-10-CM

## 2024-07-10 DIAGNOSIS — I272 Pulmonary hypertension, unspecified: Secondary | ICD-10-CM

## 2024-07-10 DIAGNOSIS — E039 Hypothyroidism, unspecified: Secondary | ICD-10-CM

## 2024-07-10 DIAGNOSIS — F5105 Insomnia due to other mental disorder: Secondary | ICD-10-CM

## 2024-07-10 DIAGNOSIS — I10 Essential (primary) hypertension: Secondary | ICD-10-CM

## 2024-07-10 DIAGNOSIS — F409 Phobic anxiety disorder, unspecified: Secondary | ICD-10-CM

## 2024-07-10 DIAGNOSIS — C4371 Malignant melanoma of right lower limb, including hip: Secondary | ICD-10-CM

## 2024-07-10 DIAGNOSIS — G894 Chronic pain syndrome: Secondary | ICD-10-CM

## 2024-07-10 DIAGNOSIS — E785 Hyperlipidemia, unspecified: Secondary | ICD-10-CM

## 2024-07-10 MED ORDER — LEVOTHYROXINE SODIUM 137 MCG PO TABS: ORAL_TABLET | ORAL | 1 refills | Status: AC

## 2024-07-10 NOTE — Progress Notes (Signed)
 Name: [name]   MRN: [id number]    DOB: 1941-03-13   Date:07/10/2024       Progress Note  Subjective  Chief Complaint  Chief Complaint  Patient presents with   Medical Management of Chronic Issues   Discussed the use of AI scribe software for clinical note transcription with the patient, who gave verbal consent to proceed.  History of Present Illness [name] is an 83 year old female with congestive heart failure and melanoma who presents for follow-up after recent hospitalizations and ongoing treatment for melanoma.  She has a history of congestive heart failure and was recently hospitalized for this condition. She underwent cardioversion. She had an episode of paroxysmal Afib with RVR She is currently on higher dose of amiodarone , which has helped manage her symptoms. She also takes Eliquis  and furosemide  daily, along with potassium supplementation. She cannot tolerate beta blockers due to hypotension. She is also not taking ARB or ACE and she thinks also secondary to hypotension but is taking cardura    She has been diagnosed with melanoma on her right lower leg, initially identified by her dermatologist after a biopsy in March. The melanoma was staged as T4A, stage 3C, and involved the dermis and subcutaneous tissue with in-transit metastasis. A PET scan showed the melanoma was localized to her leg. She has undergone local injections with T-VEC for treatment and has additional treatments scheduled. She experiences pain from the injections and has noticed changes in the lesions.  She has a history of hypothyroidism, managed with levothyroxine  137 mcg daily. No symptoms of hair loss, dry skin, or changes in bowel movements are reported. Her sleep is generally good, aided by temazepam  for anxiety and sleep disturbances.  She has chronic kidney disease stage 3A, with fluctuating kidney function. She is not on specific medication for this condition but is monitored regularly. Her  blood pressure is managed with amlodipine  2.5 mg, as she cannot tolerate beta blockers and not sure why not taking ARB or ACE.  She experiences chronic neck and back pain.    Patient Active Problem List   Diagnosis Date Noted   Chronic heart failure with preserved ejection fraction (HFpEF) (HCC) 05/04/2024   Atrial fibrillation with RVR (HCC) 05/03/2024   Afib (HCC) 05/03/2024   Persistent atrial fibrillation (HCC) 05/03/2024   Basal cell carcinoma, face 01/12/2022   Senile purpura (HCC) 01/12/2022   Orthostatic hypotension 07/24/2021   Bradycardia 07/24/2021   History of pyelonephritis    History of pneumonia 10/2020   Hydronephrosis of right kidney 03/29/2020   Depression 03/29/2020   Chronic combined systolic (congestive) and diastolic (congestive) heart failure (HCC) 05/24/2018   AF (paroxysmal atrial fibrillation) (HCC) 05/24/2018   CKD (chronic kidney disease), stage III (HCC) 05/17/2018   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   Shortness of breath 05/07/2018   Pulmonary hypertension (HCC) 04/05/2018   Paroxysmal sinus tachycardia (HCC) 01/29/2018   Leg pain 01/04/2018   Carotid artery calcification, bilateral 12/19/2017   Cervical radiculitis 12/19/2017   Hyperglycemia 03/06/2016   Hypothyroid 04/21/2015   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   Anxiety 04/21/2015   Dyslipidemia 04/21/2015   Diuretic-induced hypokalemia 04/21/2015   SVT (supraventricular tachycardia) (HCC) 05/27/2012   Essential hypertension 05/27/2012    Past Surgical History:  Procedure Laterality Date   BACK SURGERY  07/2019   CARDIOVERSION N/A 03/26/2018   Procedure: CARDIOVERSION;  Surgeon: Mady Bruckner, MD;  Location: [facility];  Service: Cardiovascular;  Laterality: N/A;  CARDIOVERSION N/A 04/24/2018   Procedure: CARDIOVERSION;  Surgeon: Perla Evalene PARAS, MD;  Location: [facility];  Service: Cardiovascular;  Laterality: N/A;   CARDIOVERSION N/A 05/06/2024   Procedure:  CARDIOVERSION;  Surgeon: Perla Evalene PARAS, MD;  Location: [facility];  Service: Cardiovascular;  Laterality: N/A;   CATARACT EXTRACTION W/PHACO Right 07/12/2021   Procedure: CATARACT EXTRACTION PHACO AND INTRAOCULAR LENS PLACEMENT (IOC) RIGHT 16.92 01:30.8 ;  Surgeon: Jaye Fallow, MD;  Location: [facility];  Service: Ophthalmology;  Laterality: Right;   ELECTROPHYSIOLOGY STUDY N/A 02/27/2012   Procedure: ELECTROPHYSIOLOGY STUDY;  Surgeon: Danelle LELON Birmingham, MD;  Location: [facility] C[facility] LAB;  Service: Cardiovascular;  Laterality: N/A;   EPS and ablation for SVT  5/13   slow pathway ablation by Dr Birmingham   EYE SURGERY     surgery for detatched retina   NECK SURGERY  02/2020   SUPRAVENTRICULAR TACHYCARDIA ABLATION N/A 02/27/2012   Procedure: SUPRAVENTRICULAR TACHYCARDIA ABLATION;  Surgeon: Danelle LELON Birmingham, MD;  Location: [facility] CATH LAB;  Service: Cardiovascular;  Laterality: N/A;   TEE WITHOUT CARDIOVERSION N/A 10/19/2021   Procedure: TRANSESOPHAGEAL ECHOCARDIOGRAM (TEE);  Surgeon: Darliss Rogue, MD;  Location: [facility];  Service: Cardiovascular;  Laterality: N/A;    Family History  Problem Relation Age of Onset   Alzheimer's disease Mother    Stroke Father    Breast cancer Father    Heart disease Father        Pacemaker   Kidney cancer Maternal Grandmother    Alzheimer's disease Maternal Grandfather    Thyroid  disease Paternal Grandmother    Stroke Paternal Grandfather    Breast cancer Paternal Aunt    Cancer Daughter 25       ovarian cancer    Social History   Tobacco Use   Smoking status: Never   Smokeless tobacco: Never   Tobacco comments:    smoking cessation materials not required  Substance Use Topics   Alcohol use: No     Current Outpatient Medications:    acetaminophen  (TYLENOL ) 650 MG CR tablet, Take 650-1,300 mg by mouth every 8 (eight) hours as needed for pain. , Disp: , Rfl:    amiodarone  (PACERONE ) 200 MG tablet, Take 1 tablet (200 mg total) by mouth 2 (two)  times daily., Disp: 60 tablet, Rfl: 0   amLODipine  (NORVASC ) 5 MG tablet, TAKE 1 TABLET(5 MG) BY MOUTH DAILY, Disp: 90 tablet, Rfl: 3   apixaban  (ELIQUIS ) 2.5 MG TABS tablet, TAKE 1 TABLET(2.5 MG) BY MOUTH TWICE DAILY, Disp: 180 tablet, Rfl: 1   busPIRone  (BUSPAR ) 5 MG tablet, Take 1 tablet (5 mg total) by mouth 3 (three) times daily. (Patient taking differently: Take 5 mg by mouth 2 (two) times daily.), Disp: 270 tablet, Rfl: 1   chlorpheniramine-HYDROcodone  (TUSSIONEX) 10-8 MG/5ML, Take 5 mLs by mouth at bedtime as needed for cough., Disp: 70 mL, Rfl: 0   cholecalciferol  (VITAMIN D ) 1000 UNITS tablet, Take 2,000 Units by mouth daily., Disp: , Rfl:    Cranberry 500 MG TABS, Take 2 tablets by mouth daily., Disp: , Rfl:    doxazosin  (CARDURA ) 1 MG tablet, Take 1 tablet (1 mg total) by mouth daily., Disp: 90 tablet, Rfl: 3   doxycycline  (VIBRAMYCIN ) 100 MG capsule, Take 100 mg by mouth 2 (two) times daily., Disp: , Rfl:    furosemide  (LASIX ) 20 MG tablet, TAKE 1 TABLET(20 MG) BY MOUTH DAILY, Disp: 90 tablet, Rfl: 3   levothyroxine  (SYNTHROID ) 137 MCG tablet, TAKE 1 TABLET(137 MCG) BY MOUTH  DAILY BEFORE BREAKFAST, Disp: 90 tablet, Rfl: 1   loratadine  (CLARITIN ) 10 MG tablet, Take 10 mg by mouth daily as needed for allergies or rhinitis., Disp: , Rfl:    Melatonin 10 MG TABS, Take 10 mg by mouth at bedtime as needed., Disp: , Rfl:    Multiple Vitamin (MULITIVITAMIN WITH MINERALS) TABS, Take 1 tablet by mouth daily., Disp: , Rfl:    potassium chloride  (KLOR-CON ) 10 MEQ tablet, TAKE 1 TABLET BY MOUTH EVERY DAY AS DIRECTED. MAY TAKE AN EXTRA AFTER LUNCH WITH FLUID PILL AS NEEDED FOR SWELLING, Disp: 180 tablet, Rfl: 2   rosuvastatin  (CRESTOR ) 40 MG tablet, Take 1 tablet (40 mg total) by mouth daily., Disp: 90 tablet, Rfl: 3   temazepam  (RESTORIL ) 15 MG capsule, Take 1 capsule (15 mg total) by mouth at bedtime., Disp: 30 capsule, Rfl: 0  Allergies  Allergen Reactions   Chocolate Swelling   Penicillins  Swelling and Rash    Has patient had a PCN reaction causing immediate rash, facial/tongue/throat swelling, SOB or lightheadedness with hypotension: Yes Has patient had a PCN reaction causing severe rash involving mucus membranes or skin necrosis: No Has patient had a PCN reaction that required hospitalization: No Has patient had a PCN reaction occurring within the last 10 years: No If all of the above answers are NO, then may proceed with Cephalosporin use.    Codeine Nausea And Vomiting   Erythromycin Itching and Swelling   Septra [Sulfamethoxazole-Trimethoprim] Swelling    I personally reviewed active problem list, medication list, allergies, family history with the patient/caregiver today.   ROS  Ten systems reviewed and is negative except as mentioned in HPI    Objective Physical Exam CONSTITUTIONAL: Patient appears well-developed and well-nourished. No distress. HEENT: Head atraumatic, normocephalic, neck supple. CARDIOVASCULAR: Normal rate, regular rhythm and normal heart sounds. No murmur heard. Edema of right lower leg present, picture below  PULMONARY: Effort normal and breath sounds normal. Lungs clear to auscultation. No respiratory distress. MUSCULOSKELETAL: slow gait, using a cane today  PSYCHIATRIC: Patient has a normal mood and affect. Behavior is normal. Judgment and thought content normal.     Vitals:   07/10/24 0929  BP: (!) 148/74  Pulse: 80  Resp: 16  SpO2: 97%  Weight: 122 lb 6.4 oz (55.5 kg)  Height: 5' 6 (1.676 m)    Body mass index is 19.76 kg/m.  Recent Results (from the past 2160 hours)  Basic metabolic panel     Status: Abnormal   Collection Time: 04/23/24  9:20 AM  Result Value Ref Range   Sodium 135 135 - 145 mmol/L   Potassium 3.9 3.5 - 5.1 mmol/L   Chloride 101 98 - 111 mmol/L   CO2 23 22 - 32 mmol/L   Glucose, Bld 153 (H) 70 - 99 mg/dL    Comment: Glucose reference range applies only to samples taken after fasting for at least 8  hours.   BUN 16 8 - 23 mg/dL   Creatinine, Ser 9.09 0.44 - 1.00 mg/dL   Calcium  9.2 8.9 - 10.3 mg/dL   GFR, Estimated >39 >39 mL/min    Comment: (NOTE) Calculated using the CKD-EPI Creatinine Equation (2021)    Anion gap 11 5 - 15    Comment: Performed at [facility] [address] [place], [place] [address]  CBC     Status: None   Collection Time: 04/23/24  9:20 AM  Result Value Ref Range   WBC 6.5 4.0 - 10.5  K/uL   RBC 4.21 3.87 - 5.11 MIL/uL   Hemoglobin 13.5 12.0 - 15.0 g/dL   HCT 59.9 63.9 - 53.9 %   MCV 95.0 80.0 - 100.0 fL   MCH 32.1 26.0 - 34.0 pg   MCHC 33.8 30.0 - 36.0 g/dL   RDW 86.2 88.4 - 84.4 %   Platelets 220 150 - 400 K/uL   nRBC 0.0 0.0 - 0.2 %    Comment: Performed at [facility], [address] [place], [place] [address]  Troponin I (High Sensitivity)     Status: None   Collection Time: 04/23/24  9:20 AM  Result Value Ref Range   Troponin I (High Sensitivity) 7 <18 ng/L    Comment: (NOTE) Elevated high sensitivity troponin I (hsTnI) values and significant  changes across serial measurements may suggest ACS but many other  chronic and acute conditions are known to elevate hsTnI results.  Refer to the Links section for chest pain algorithms and additional  guidance. Performed at [facility], [address] [place], [place] [address]   Protime-INR (order if Patient is taking Coumadin  / Warfarin)     Status: None   Collection Time: 04/23/24  9:20 AM  Result Value Ref Range   Prothrombin Time 15.0 11.4 - 15.2 seconds   INR 1.1 0.8 - 1.2    Comment: (NOTE) INR goal varies based on device and disease states. Performed at [facility], [address] [place], [place] [address]   Magnesium      Status: None   Collection Time: 04/23/24  9:20 AM  Result Value Ref Range   Magnesium  2.1 1.7 - 2.4 mg/dL    Comment: Performed at [facility], [address] [place], [place] [address]  D-dimer,  quantitative     Status: None   Collection Time: 04/23/24  9:20 AM  Result Value Ref Range   D-Dimer, Quant <0.27 0.00 - 0.50 ug/mL-FEU    Comment: (NOTE) At the manufacturer cut-off value of 0.5 g/mL FEU, this assay has a negative predictive value of 95-100%.This assay is intended for use in conjunction with a clinical pretest probability (PTP) assessment model to exclude pulmonary embolism (PE) and deep venous thrombosis (DVT) in outpatients suspected of PE or DVT. Results should be correlated with clinical presentation. Performed at [facility], [address] [place], [place] [address]   Troponin I (High Sensitivity)     Status: None   Collection Time: 04/23/24 11:21 AM  Result Value Ref Range   Troponin I (High Sensitivity) 9 <18 ng/L    Comment: (NOTE) Elevated high sensitivity troponin I (hsTnI) values and significant  changes across serial measurements may suggest ACS but many other  chronic and acute conditions are known to elevate hsTnI results.  Refer to the Links section for chest pain algorithms and additional  guidance. Performed at [facility], [address] [place], [place] [address]   Basic metabolic panel     Status: Abnormal   Collection Time: 05/03/24 11:03 AM  Result Value Ref Range   Sodium 136 135 - 145 mmol/L   Potassium 4.0 3.5 - 5.1 mmol/L   Chloride 101 98 - 111 mmol/L   CO2 27 22 - 32 mmol/L   Glucose, Bld 169 (H) 70 - 99 mg/dL    Comment: Glucose reference range applies only to samples taken after fasting for at least 8 hours.   BUN 15 8 - 23 mg/dL   Creatinine, Ser 9.18  0.44 - 1.00 mg/dL   Calcium  9.3 8.9 - 10.3 mg/dL   GFR, Estimated >39 >39 mL/min    Comment: (NOTE) Calculated using the CKD-EPI Creatinine Equation (2021)    Anion gap 8 5 - 15    Comment: Performed at [facility], [address] [place], [place] [address]  CBC     Status: None   Collection Time: 05/03/24 11:03 AM  Result Value Ref  Range   WBC 8.5 4.0 - 10.5 K/uL   RBC 4.31 3.87 - 5.11 MIL/uL   Hemoglobin 13.9 12.0 - 15.0 g/dL   HCT 59.0 63.9 - 53.9 %   MCV 94.9 80.0 - 100.0 fL   MCH 32.3 26.0 - 34.0 pg   MCHC 34.0 30.0 - 36.0 g/dL   RDW 86.4 88.4 - 84.4 %   Platelets 240 150 - 400 K/uL   nRBC 0.0 0.0 - 0.2 %    Comment: Performed at [facility], [address] [place], [place] [address]  Troponin I (High Sensitivity)     Status: None   Collection Time: 05/03/24 11:03 AM  Result Value Ref Range   Troponin I (High Sensitivity) 8 <18 ng/L    Comment: (NOTE) Elevated high sensitivity troponin I (hsTnI) values and significant  changes across serial measurements may suggest ACS but many other  chronic and acute conditions are known to elevate hsTnI results.  Refer to the Links section for chest pain algorithms and additional  guidance. Performed at [facility], [address] [place], [place] [address]   Troponin I (High Sensitivity)     Status: None   Collection Time: 05/03/24 12:56 PM  Result Value Ref Range   Troponin I (High Sensitivity) 10 <18 ng/L    Comment: (NOTE) Elevated high sensitivity troponin I (hsTnI) values and significant  changes across serial measurements may suggest ACS but many other  chronic and acute conditions are known to elevate hsTnI results.  Refer to the Links section for chest pain algorithms and additional  guidance. Performed at [facility], [address] [place], [place] [address]   TSH     Status: None   Collection Time: 05/03/24 12:57 PM  Result Value Ref Range   TSH 1.579 0.350 - 4.500 uIU/mL    Comment: Performed by a 3rd Generation assay with a functional sensitivity of <=0.01 uIU/mL. Performed at [facility], [address] [place], [place] [address]   Magnesium      Status: None   Collection Time: 05/03/24 12:57 PM  Result Value Ref Range   Magnesium  2.1 1.7 - 2.4 mg/dL    Comment: Performed at [facility], [address] [place], [place] [address]  Phosphorus     Status: None   Collection Time: 05/03/24 12:57 PM  Result Value Ref Range   Phosphorus 2.9 2.5 - 4.6 mg/dL    Comment: Performed at [facility], [address] [place], [place] [address]  ECHOCARDIOGRAM COMPLETE     Status: None   Collection Time: 05/03/24  2:29 PM  Result Value Ref Range   Weight 2,045.87 oz   Height 66 in   BP 127/66 mmHg   Ao pk vel 0.99 m/s   AV Area VTI 2.31 cm2   AR max vel 2.33 cm2   AV Mean grad 2.0 mmHg   AV Peak grad 3.9 mmHg   S' Lateral 3.20 cm   AV Area mean vel 2.45 cm2   Area-P 1/2  3.40 cm2   Est EF 50 - 55%   Basic metabolic panel     Status: Abnormal   Collection Time: 05/04/24  4:49 AM  Result Value Ref Range   Sodium 133 (L) 135 - 145 mmol/L   Potassium 4.0 3.5 - 5.1 mmol/L   Chloride 100 98 - 111 mmol/L   CO2 25 22 - 32 mmol/L   Glucose, Bld 135 (H) 70 - 99 mg/dL    Comment: Glucose reference range applies only to samples taken after fasting for at least 8 hours.   BUN 16 8 - 23 mg/dL   Creatinine, Ser 9.18 0.44 - 1.00 mg/dL   Calcium  9.1 8.9 - 10.3 mg/dL   GFR, Estimated >39 >39 mL/min    Comment: (NOTE) Calculated using the CKD-EPI Creatinine Equation (2021)    Anion gap 8 5 - 15    Comment: Performed at [facility], [address] [place], [place] [address]  Resp panel by RT-PCR (RSV, Flu A&B, Covid) Anterior Nasal Swab     Status: None   Collection Time: 06/05/24  2:51 AM   Specimen: Anterior Nasal Swab  Result Value Ref Range   SARS Coronavirus 2 by RT PCR NEGATIVE NEGATIVE    Comment: (NOTE) SARS-CoV-2 target nucleic acids are NOT DETECTED.  The SARS-CoV-2 RNA is generally detectable in upper respiratory specimens during the acute phase of infection. The lowest concentration of SARS-CoV-2 viral copies this assay can detect is 138 copies/mL. A negative result does not preclude SARS-Cov-2 infection and should not be used as the  sole basis for treatment or other patient management decisions. A negative result may occur with  improper specimen collection/handling, submission of specimen other than nasopharyngeal swab, presence of viral mutation(s) within the areas targeted by this assay, and inadequate number of viral copies(<138 copies/mL). A negative result must be combined with clinical observations, patient history, and epidemiological information. The expected result is Negative.  Fact Sheet for Patients:  BloggerCourse.com  Fact Sheet for Healthcare Providers:  SeriousBroker.it  This test is no t yet approved or cleared by the [place]  FDA and  has been authorized for detection and/or diagnosis of SARS-CoV-2 by FDA under an Emergency Use Authorization (EUA). This EUA will remain  in effect (meaning this test can be used) for the duration of the COVID-19 declaration under Section 564(b)(1) of the Act, 21 U.S.C.section 360bbb-3(b)(1), unless the authorization is terminated  or revoked sooner.       Influenza A by PCR NEGATIVE NEGATIVE   Influenza B by PCR NEGATIVE NEGATIVE    Comment: (NOTE) The Xpert Xpress SARS-CoV-2/FLU/RSV plus assay is intended as an aid in the diagnosis of influenza from Nasopharyngeal swab specimens and should not be used as a sole basis for treatment. Nasal washings and aspirates are unacceptable for Xpert Xpress SARS-CoV-2/FLU/RSV testing.  Fact Sheet for Patients: BloggerCourse.com  Fact Sheet for Healthcare Providers: SeriousBroker.it  This test is not yet approved or cleared by the [place]  FDA and has been authorized for detection and/or diagnosis of SARS-CoV-2 by FDA under an Emergency Use Authorization (EUA). This EUA will remain in effect (meaning this test can be used) for the duration of the COVID-19 declaration under Section 564(b)(1) of the Act, 21  U.S.C. section 360bbb-3(b)(1), unless the authorization is terminated or revoked.     Resp Syncytial Virus by PCR NEGATIVE NEGATIVE    Comment: (NOTE) Fact Sheet for Patients: BloggerCourse.com  Fact Sheet for Healthcare Providers: SeriousBroker.it  This test  is not yet approved or cleared by the [place]  FDA and has been authorized for detection and/or diagnosis of SARS-CoV-2 by FDA under an Emergency Use Authorization (EUA). This EUA will remain in effect (meaning this test can be used) for the duration of the COVID-19 declaration under Section 564(b)(1) of the Act, 21 U.S.C. section 360bbb-3(b)(1), unless the authorization is terminated or revoked.  Performed at [facility], [address] [place], [place] [address]      PHQ2/9:    07/10/2024    9:25 AM 01/10/2024    9:19 AM 07/12/2023    8:57 AM 06/05/2023    7:56 AM 01/09/2023    8:40 AM  Depression screen PHQ 2/9  Decreased Interest 1 1 1  0 0  Down, Depressed, Hopeless 1 1 2  0 0  PHQ - 2 Score 2 2 3  0 0  Altered sleeping 0 0 0 0 0  Tired, decreased energy 0 0 0 0 0  Change in appetite 0 0 0 0 0  Feeling bad or failure about yourself  0 0 0 0 0  Trouble concentrating 1 1 0 0 0  Moving slowly or fidgety/restless 0 0 0 0 0  Suicidal thoughts 0 0 0 0 0  PHQ-9 Score 3 3 3  0 0  Difficult doing work/chores Somewhat difficult Somewhat difficult Somewhat difficult      phq 9 is positive  Fall Risk:    07/10/2024    9:25 AM 01/10/2024    9:06 AM 07/12/2023    8:57 AM 06/05/2023    7:55 AM 01/09/2023    8:45 AM  Fall Risk   Falls in the past year? 0 0 0 0 0  Number falls in past yr: 0 0  0 0  Injury with Fall? 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 Recent biopsy benign. - Continue TVEC injections on October 1st and October 15th. - Schedule follow-up PET scan on December 1st.  Paroxysmal atrial fibrillation Paroxysmal  atrial fibrillation with recent episode of rapid ventricular response. Managed with amiodarone  and Eliquis . Scheduled for ablation. Beta blockers not tolerated. - Continue amiodarone  200 mg twice daily. - Continue Eliquis  2.5 mg twice daily. - Proceed with ablation on November 7th.  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 Managed with furosemide  and potassium. Beta blockers not tolerated. - Continue furosemide  daily with potassium supplementation.  Pulmonary hypertension Pulmonary hypertension managed by cardiologist.  Chronic kidney disease stage 3a Chronic kidney disease stage 3a with fluctuating eGFR. No ACE inhibitors or ARBs due to hypotension risk. - Monitor kidney function with regular blood tests. - Consider discussing blood pressure medication change with cardiologist.  Essential hypertension Essential hypertension managed with amlodipine . Higher blood pressure in clinic due to white coat effect. Beta blockers not tolerated. - Continue amlodipine  2.5 mg daily. - Monitor blood pressure at home.  Hypothyroidism Hypothyroidism managed with levothyroxine . Recent thyroid  function tests normal. - Continue levothyroxine  137 mcg daily.  Chronic pain syndrome (neck and back) Chronic neck and back pain. Awaiting injections, delayed by insurance  issues. - Follow up on pain management and injections after ablation.  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 -  Continue temazepam  as needed for sleep.

## 2024-07-11 ENCOUNTER — Ambulatory Visit: Payer: Self-pay | Admitting: Family Medicine

## 2024-07-11 ENCOUNTER — Other Ambulatory Visit: Payer: Self-pay | Admitting: Cardiovascular Disease

## 2024-07-11 DIAGNOSIS — R748 Abnormal levels of other serum enzymes: Secondary | ICD-10-CM

## 2024-07-11 DIAGNOSIS — I5042 Chronic combined systolic (congestive) and diastolic (congestive) heart failure: Secondary | ICD-10-CM

## 2024-07-11 LAB — CBC WITH DIFFERENTIAL/PLATELET
Absolute Lymphocytes: 841 {cells}/uL — ABNORMAL LOW (ref 850–3900)
Absolute Monocytes: 423 {cells}/uL (ref 200–950)
Basophils Absolute: 9 {cells}/uL (ref 0–200)
Basophils Relative: 0.2 %
Eosinophils Absolute: 9 {cells}/uL — ABNORMAL LOW (ref 15–500)
Eosinophils Relative: 0.2 %
HCT: 38.4 % (ref 35.0–45.0)
Hemoglobin: 13 g/dL (ref 11.7–15.5)
MCH: 32.2 pg (ref 27.0–33.0)
MCHC: 33.9 g/dL (ref 32.0–36.0)
MCV: 95 fL (ref 80.0–100.0)
MPV: 9.6 fL (ref 7.5–12.5)
Monocytes Relative: 9 %
Neutro Abs: 3417 {cells}/uL (ref 1500–7800)
Neutrophils Relative %: 72.7 %
Platelets: 219 Thousand/uL (ref 140–400)
RBC: 4.04 Million/uL (ref 3.80–5.10)
RDW: 14.4 % (ref 11.0–15.0)
Total Lymphocyte: 17.9 %
WBC: 4.7 Thousand/uL (ref 3.8–10.8)

## 2024-07-11 LAB — COMPREHENSIVE METABOLIC PANEL WITH GFR
AG Ratio: 2 (calc) (ref 1.0–2.5)
ALT: 110 U/L — ABNORMAL HIGH (ref 6–29)
AST: 63 U/L — ABNORMAL HIGH (ref 10–35)
Albumin: 4.6 g/dL (ref 3.6–5.1)
Alkaline phosphatase (APISO): 83 U/L (ref 37–153)
BUN: 14 mg/dL (ref 7–25)
CO2: 22 mmol/L (ref 20–32)
Calcium: 9.4 mg/dL (ref 8.6–10.4)
Chloride: 102 mmol/L (ref 98–110)
Creat: 0.87 mg/dL (ref 0.60–0.95)
Globulin: 2.3 g/dL (ref 1.9–3.7)
Glucose, Bld: 125 mg/dL — ABNORMAL HIGH (ref 65–99)
Potassium: 3.7 mmol/L (ref 3.5–5.3)
Sodium: 138 mmol/L (ref 135–146)
Total Bilirubin: 0.4 mg/dL (ref 0.2–1.2)
Total Protein: 6.9 g/dL (ref 6.1–8.1)
eGFR: 67 mL/min/1.73m2 (ref >=60)

## 2024-07-11 LAB — LIPID PANEL
Cholesterol: 118 mg/dL (ref <200)
HDL: 59 mg/dL (ref >=50)
LDL Cholesterol (Calc): 42 mg/dL
Non-HDL Cholesterol (Calc): 59 mg/dL (ref <130)
Total CHOL/HDL Ratio: 2 (calc) (ref <5.0)
Triglycerides: 88 mg/dL (ref <150)

## 2024-07-11 LAB — EXTRA: SPECIMEN TYPE RECEIVED:: 1014

## 2024-07-11 NOTE — Telephone Encounter (Signed)
 Pt last saw Dr Kennyth 06/02/24, last labs 07/10/24 Creat 0.87, age 83, weight 55.5kg, based on specified criteria pt is on appropriate dosage of Eliquis  2.5mg  BID for afib.  Will refill rx.

## 2024-07-17 LAB — HEPATIC FUNCTION PANEL
AG Ratio: 2.3 (calc) (ref 1.0–2.5)
ALT: 32 U/L — ABNORMAL HIGH (ref 6–29)
AST: 24 U/L (ref 10–35)
Albumin: 4.5 g/dL (ref 3.6–5.1)
Alkaline phosphatase (APISO): 68 U/L (ref 37–153)
Bilirubin, Direct: 0.1 mg/dL (ref 0.0–0.2)
Globulin: 2 g/dL (ref 1.9–3.7)
Indirect Bilirubin: 0.4 mg/dL (ref 0.2–1.2)
Total Bilirubin: 0.5 mg/dL (ref 0.2–1.2)
Total Protein: 6.5 g/dL (ref 6.1–8.1)

## 2024-07-17 LAB — EXTRA

## 2024-07-21 ENCOUNTER — Telehealth: Payer: Self-pay | Admitting: Cardiology

## 2024-07-21 MED ORDER — AMIODARONE HCL 200 MG PO TABS: 200.0000 mg | ORAL_TABLET | Freq: Two times a day (BID) | ORAL | 2 refills | Status: DC

## 2024-07-21 NOTE — Telephone Encounter (Signed)
 RX sent in

## 2024-07-21 NOTE — Telephone Encounter (Signed)
*  STAT* If patient is at the pharmacy, call can be transferred to refill team.   1. Which medications need to be refilled? (please list name of each medication and dose if known)   amiodarone  (PACERONE ) 200 MG tablet     2. Would you like to learn more about the convenience, safety, & potential cost savings by using the [facility]? No      3. Are you open to using the [facility] (Type [facility]. No   4. Which pharmacy/location (including street and city if local pharmacy) is medication to be sent to? [id number] - [place], [place] - [address] AT [facility] OF [address]       5. Do they need a 30 day or 90 day supply? 90 day   Pt was prescribed by ED doctor, but advised by Dr. Kennyth to continue medication. Pt needs a new prescription.

## 2024-07-23 NOTE — Procedures (Addendum)
  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 3-Correct site - Deward PARAS Warden MD states the procedure/consent 4-Agreement on procedure - Deward PARAS Warden MD, Estefana Leatherwood, Allean Ohm MD, and Lawrnce GORMAN Kiang; concludes Timeout - Deward PARAS Warden MD 5-Time out documented by Deward PARAS. Mosca MD  CONSENT:  Obtained  DISCHARGE INSTRUCTIONS:  Provided.  PROCEDURE IN DETAIL: Informed consent was obtained.  The patient was properly identified as Lawrnce GORMAN Kiang.  The patient was placed in the semi-reclining position.  The lesions of the right lower leg region, were marked with an ink pen and measured (see progress note for measurements and photograph).  The area was prepped with Chloraprep.   For each dermal/subdermal lesion, approximately 100 million pfu/ml T-VEC was injected intralesionally, each with a separate 25 gauge needle.  The injected lesion sizes and volume injected into each were as follows: 1) 0.7 cm:  0.3 ml T-VEC  Pressure was held on each site after injection for hemostasis.  The skin surface was again swabbed to remove any residual virus.  A sterile bandage was applied with gentle pressure.  The patient tolerated the procedure well and remained stable throughout the procedure. No complications  were encountered     ATTESTATION:   FR- Surgery - (Entire alt) - I was present throughout the entire procedure as documented in my operative note (FR).    Deward PARAS. Warden, MD, PhD Division of Advanced Oncologic and GI Surgery

## 2024-07-31 ENCOUNTER — Encounter: Payer: Self-pay | Admitting: Cardiology

## 2024-07-31 ENCOUNTER — Ambulatory Visit: Attending: Cardiology | Admitting: Cardiology

## 2024-07-31 VITALS — BP 151/64 | HR 62 | Ht 66.0 in | Wt 125.6 lb

## 2024-07-31 DIAGNOSIS — I447 Left bundle-branch block, unspecified: Secondary | ICD-10-CM

## 2024-07-31 DIAGNOSIS — I4819 Other persistent atrial fibrillation: Secondary | ICD-10-CM | POA: Diagnosis not present

## 2024-07-31 DIAGNOSIS — E782 Mixed hyperlipidemia: Secondary | ICD-10-CM | POA: Diagnosis not present

## 2024-07-31 DIAGNOSIS — I6523 Occlusion and stenosis of bilateral carotid arteries: Secondary | ICD-10-CM

## 2024-07-31 DIAGNOSIS — I10 Essential (primary) hypertension: Secondary | ICD-10-CM

## 2024-07-31 DIAGNOSIS — I739 Peripheral vascular disease, unspecified: Secondary | ICD-10-CM

## 2024-07-31 DIAGNOSIS — I5042 Chronic combined systolic (congestive) and diastolic (congestive) heart failure: Secondary | ICD-10-CM

## 2024-07-31 DIAGNOSIS — I471 Supraventricular tachycardia, unspecified: Secondary | ICD-10-CM

## 2024-07-31 NOTE — Progress Notes (Signed)
 Cardiology Office Note   Date:  07/31/2024  ID:  SKYA MCCULLUM, DOB 1941-02-07, MRN [id number] PCP: Glenard Mire, MD  [facility] Providers Cardiologist:  Evalene Lunger, MD Electrophysiologist:  Fonda Kitty, MD     History of Present Illness [name] is a 83 y.o. female with with a past medical history of AVNRT status post catheter ablation, PAF status post multiple cardioversions, combined systolic and diastolic heart failure, orthostasis, chronic left bundle branch block, hypertension, hyperlipidemia, degenerative joint disease, leg pain with abnormal ABIs (2018), mild carotid disease, who presents today for follow-up.   Zio patch in 01/2018 showed predominant rhythm was sinus with an average rate of 66 bpm, bundle branch block/IVCD was present, 45 runs SVT were noted with the fastest interval lasting 13 beats and the longest interval lasting 14.5 seconds, some instances besides of SVT may have been short bursts of atrial fibrillation/flutter, A-fib occurred with a 2% burden of with the longest episode lasting 4 hours and 24 minutes, secondary AV block type I was present.  She was found to have frequent PVCs and fusion beats with subsequent event monitoring showing runs of SVT as well as runs of A-fib, she was placed on Xarelto .  She was seen back in clinic in 02/2018 and noted to be in A-fib with RVR despite beta-blocker and calcium  channel blocker therapy.  In the setting she was placed on amiodarone  with plans for outpatient DCCV.  However she was admitted with CHF exacerbation in the context of A-fib with RVR in 03/2018.  She was cardioverted at that time.  Recurrent A-fib and heart failure in 04/2018 required repeat amiodarone  and DCCV.  She was admitted to the hospital 10/2020 with acute respiratory failure hypoxia in the context of multifocal pneumonia treated with antibiotics, steroids, and nebulizers.  Echocardiogram revealed LVEF of 60-65%, no RWMA, G1 DD, normal RV  systolic function and ventricular cavity size, and mild to moderate mitral valve regurgitation.  She was admitted to the hospital 07/2021 with near syncope and fall/hypotension.  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  Cardiac enzymes are negative.  BNP 145.  Carotid artery ultrasound showed no hemodynamically significant stenosis bilaterally.  Echocardiogram showed an LVEF of 55 to 60%, no RWMA, moderate LVH, G1 DD, normal LV systolic function and ventricular cavity size, normal PASP, mild mitral regurgitation, mild to moderate tricuspid regurgitation, mild aortic valve insufficiency mild to moderate aortic valve sclerosis without evidence of stenosis.  She was evaluated by EP with symptoms going to be related to known orthostatic intolerance and exacerbated by pain.  Given this was not abrupt onset and offset it was felt to be unlikely contributing with recommendations for loop recorder if symptoms recur.  EP recommended discontinuation of metoprolol .  At the time of discharge primary service held amiodarone   She was seen in follow-up 08/03/2021 is doing well from a cardiac perspective.  She denied having further presyncopal episodes.  Amiodarone  was restarted in an effort to maintain sinus rhythm.  It was recommended that she continue to hold metoprolol  given prior bradycardia and first-degree AV block.  She was seen in clinic 09/2021 doing well from a cardiac perspective.  No near syncopal episodes.  Blood pressure log shows readings typically around 130s to 150s.  She does note some lightheadedness with her blood pressure gets around 115 systolic.  She has tolerated low-dose amlodipine  2.5 mg daily without issues and remained on amiodarone  and apixaban .  It was recommended  she follow back up with EP for consideration of loop recorder, she was sent for updated labs.  She was scheduled for TEE to further evaluate her mitral regurgitation.  Continued on GDMT as tolerated.  She  underwent TEE 10/19/2021 which revealed mitral regurgitation was mild, no ASD or VSD, no PFO, the LA was well-visualized no spontaneous contrast and no thrombus in the LAA or LA appendage.  The descending thoracic aorta had no mural aortic debris with no evidence of aneurysmal dilatation or dissection.  She was evaluated in clinic 10/27/2021.  She had previously stopped her blood pressure medications due to blood pressure dropping low.  Amlodipine  was increased to 5 mg daily and she was continuing to check her blood pressures at home.  She was also continued on apixaban  and considering referral to EP for loop recorder.  She was evaluated in the [facility] emergency department 04/23/2024 with complaint of palpitations.  She reports they typically resolve within a few minutes and she is otherwise usually with sinus rhythm.  However she had persistent symptoms since Sunday.  Had a brief episode of chest pain on Sunday that had now resolved.  Reported some shortness of breath but no syncope.  States has been compliant with her current medication regimen.  Vital signs showed a blood pressure of 134/81, pulse of 111, respirations of 17, temperature 97.5.  EKG revealed atrial fibrillation.  Chest x-ray showed no acute cardiopulmonary disease.  Cardioversion was performed with successful conversion to sinus rhythm.  Afterwards she did return to her baseline mental status.  She was able to ambulate in the ER with steady gait.  She continued to have some dizziness and double vision.  She was continued on amiodarone  and apixaban  and scheduled for close follow-up with cardiology.   Patient presented to the hospital 05/03/2024 with palpitations found to be in A-fib RVR with heart rates 1 20-1 40s. Labs and chest x-ray were unremarkable. She was started on amiodarone  infusion. EP was asked to see who recommended cardioversion with outpatient follow-up to discuss ablation.    She was seen in clinic 05/13/2024 for after  hospitalization.  She had some generalized fatigue and reported compliance with her apixaban .  She was taken amiodarone  to 100 mg twice daily.  Denied any chest pain, shortness of breath, or lower extremity edema.  EKG revealed sinus rhythm with a rate of 62.  She was evaluated by EP 06/02/2024.  After long discussion on treatments for atrial fibrillation and recurrent persistent atrial fibrillation associated with decompensated heart failure a prioritization of rhythm control strategy was taken.  She was scheduled for ablation procedure.  She returns to clinic today accompanied by her husband.  States that she feels well.  Denies any chest pain, worsening shortness of breath or palpitations.  States that she has been compliant with her current medication regimen.  Denies any missed doses of her apixaban .  She denies any bleeding with no blood noted noted in her urine or stool.  States that she is to get blood work done today prior to her upcoming ablation procedure that has already been scheduled EP.  Blood pressure was a little bit elevated but she has her blood pressures from home prior to arrival this morning.  ROS: 10 point review of systems has been reviewed and considered negative except ones were listed in the HPI  Studies Reviewed EKG Interpretation Date/Time:  Thursday July 31 2024 09:53:50 EDT Ventricular Rate:  62 PR Interval:  208 QRS Duration:  164  QT Interval:  482 QTC Calculation: 489 R Axis:   -51  Text Interpretation: Normal sinus rhythm Left axis deviation Left bundle branch block When compared with ECG of 02-Jun-2024 11:22, Confirmed by Gerard Frederick ([id number]) on 07/31/2024 10:00:07 AM    Echo 04/2024  1. Left ventricular ejection fraction, by estimation, is 50 to 55%. The  left ventricle has low normal function. The left ventricle has no regional  wall motion abnormalities. There is mild left ventricular hypertrophy.  Left ventricular diastolic  parameters are  indeterminate.   2. Right ventricular systolic function is normal. The right ventricular  size is normal.   3. Left atrial size was moderately dilated.   4. Right atrial size was mild to moderately dilated.   5. The mitral valve is normal in structure. Mild mitral valve  regurgitation.   6. Tricuspid valve regurgitation is mild to moderate.   7. The aortic valve is tricuspid. Aortic valve regurgitation is not  visualized.   8. The inferior vena cava is normal in size with greater than 50%  respiratory variability, suggesting right atrial pressure of 3 mmHg    2D echo 07/24/2021: 1. Left ventricular ejection fraction, by estimation, is 55 to 60%. The  left ventricle has normal function. The left ventricle has no regional  wall motion abnormalities. There is moderate left ventricular hypertrophy.  Left ventricular diastolic  parameters are consistent with Grade I diastolic dysfunction (impaired  relaxation).   2. Right ventricular systolic function is normal. The right ventricular  size is normal. There is normal pulmonary artery systolic pressure.   3. The mitral valve is abnormal. Mild mitral valve regurgitation. No  evidence of mitral stenosis.   4. Tricuspid valve regurgitation is mild to moderate.   5. The aortic valve is tricuspid. There is mild calcification of the  aortic valve. There is mild thickening of the aortic valve. Aortic valve  regurgitation is mild. Mild to moderate aortic valve  sclerosis/calcification is present, without any evidence  of aortic stenosis.   6. The inferior vena cava is normal in size with greater than 50%  respiratory variability, suggesting right atrial pressure of 3 mmHg.   2D echo 10/31/2020: 1. Left ventricular ejection fraction, by estimation, is 60 to 65%. The  left ventricle has normal function. The left ventricle has no regional  wall motion abnormalities. Left ventricular diastolic parameters are  consistent with Grade I diastolic   dysfunction (impaired relaxation).   2. Right ventricular systolic function is normal. The right ventricular  size is normal. There is moderately elevated pulmonary artery systolic  pressure. The estimated right ventricular systolic pressure is 54.8 mmHg.   3. The mitral valve is normal in structure. Mild to moderate mitral valve  regurgitation. Two jets. No evidence of mitral stenosis.   2D echo 03/21/2018: - Left ventricle: The cavity size was normal. Wall thickness was    increased in a pattern of mild LVH. Systolic function was mildly    reduced. The estimated ejection fraction was in the range of 45%    to 50%. Diffuse hypokinesis.  - Aortic valve: There was trivial regurgitation.  - Mitral valve: Mildly calcified annulus. There was moderate    regurgitation directed posteriorly.  - Left atrium: The atrium was mildly dilated.  - Right ventricle: The cavity size was normal. Systolic function    was normal.  - Right atrium: The atrium was mildly dilated.  - Tricuspid valve: There was moderate regurgitation.  - Pulmonary arteries: Systolic  pressure was mildly to moderately    increased, in the range of 40 mm Hg to 45 mm Hg.  - Inferior vena cava: The vessel was normal in size. The    respirophasic diameter changes were blunted (< 50%), consistent    with elevated central venous pressure.  - Pericardium, extracardiac: A trivial pericardial effusion was    identified.    Zio patch 01/2018: Normal sinus rhythm min HR of 30 bpm, max HR of 179 bpm, and avg HR of 66 bpm.   Bundle Branch Block/IVCD was present.   45 Supraventricular Tachycardia runs occurred, the run with the fastest interval lasting 13 beats with a max rate of 179 bpm, the longest lasting 14.5 secs with an avg rate of 96 bpm.    Some Episodes of Supraventricula Tachycardia May be short burst of Atrial fibrillation/Flutter.    Atrial Fibrillation occurred (2% burden), ranging from 56-129 bpm (avg of 79 bpm), the  longest lasting 4 hours 24 mins with an avg rate of 79 bpm.    Second Degree AV Block-Mobitz I (Wenckebach) was present.    Atrial Fibrillation was detected within +/- 45 seconds of symptomatic patient event(s).   Other patient triggers with no arrhythmia noted   Isolated SVEs were rare (<1.0%), and no SVE Couplets or SVE Triplets were present. Isolated VEs were frequent (7.9%, 81203), VE Couplets were rare (<1.0%, 1332), and VE Triplets were rare (<1.0%, 71). Ventricular Bigeminy and Trigeminy were Present.   Lexiscan MPI 02/01/2008: 1.     Fair exercise tolerance with no arrhythmia or ischemia.  2.     Normal LV function at 76% with no transient LV dilatation or  regional  wall motion abnormality.  3.     Fixed septal defect consistent with LEFT bundle branch block. No  reversible ischemia noted.  Risk Assessment/Calculations  CHA2DS2-VASc Score = 6   This indicates a 9.7% annual risk of stroke. The patient's score is based upon: CHF History: 1 HTN History: 1 Diabetes History: 0 Stroke History: 0 Vascular Disease History: 1 Age Score: 2 Gender Score: 1    HYPERTENSION CONTROL Vitals:   07/31/24 0946 07/31/24 1011  BP: (!) 198/64 (!) 151/64    The patient's blood pressure is elevated above target today.  In order to address the patient's elevated BP:           Physical Exam VS:  BP (!) 151/64 (BP Location: Right Arm, Patient Position: Sitting, Cuff Size: Normal)   Pulse 62   Ht 5' 6 (1.676 m)   Wt 125 lb 9.6 oz (57 kg)   SpO2 99%   BMI 20.27 kg/m        Wt Readings from Last 3 Encounters:  07/31/24 125 lb 9.6 oz (57 kg)  07/10/24 122 lb 6.4 oz (55.5 kg)  06/05/24 124 lb (56.2 kg)    GEN: Well nourished, well developed in no acute distress NECK: No JVD; left carotid bruit without a bruit noted on the right CARDIAC: RRR, II/VI blowing holosystolic murmurs without rubs or gallops RESPIRATORY:  Clear to auscultation without rales, wheezing or rhonchi   ABDOMEN: Soft, non-tender, non-distended EXTREMITIES:  No edema; No deformity   ASSESSMENT AND PLAN Persistent atrial fibrillation with upcoming ablation scheduled on 08/30/2027.  Today she is maintaining sinus rhythm with rate of 62, left axis deviation, chronic left bundle branch block with no acute ischemic changes.  She is continued on amiodarone  200 mg send twice daily and apixaban  2.5 mg  twice daily for CHA2DS2-VASc status 6 for stroke prophylaxis as she does meet reduced dosing criteria for age and weight.  She denies any bleeding issues.  Chronic combined congestive heart failure with the last echocardiogram being completed in July 2025 revealing an LVEF of 50-55%.  She appears to be euvolemic on exam.  Suffers from NYHA class II symptoms.  She has been continued on furosemide  20 mg daily.  She is no longer on beta-blocker therapy due to baseline bradycardia.  Her history of orthostasis precludes GDMT escalation.  Primary hypertension with a blood pressure in clinic of 198/64.  Blood pressure prior to arrival was 150/64 at home.  She has a longstanding history of whitecoat syndrome.  Blood pressures at home are well-controlled.  She has been continued on amlodipine  5 mg daily and doxazosin  1 mg daily.  Chart review reveals the blood pressures have not been this elevated.  She has been advised her blood pressures continue to remain elevated when she returns home to take an additional dose of her amlodipine .  She has been encouraged to continue to monitor pressures 1 to 2 hours postmedication administration at home as well.  Peripheral arterial disease and carotid bruit noted on left.  Ultrasound in 06/2018 with no stenosis in 2023 repeat ultrasound revealed minimal disease.  Mixed hyperlipidemia with last LDL 51.  She is continue rosuvastatin  40 mg daily.  Ongoing management per PCP.  CKD with last serum creatinine 0.87.  Which is back at baseline with a GFR greater than 60.  She is encouraged  to maintain adequate hydration and to avoid NSAIDs.  History of SVT with prior ablation with prior ablation continued on amiodarone .  Left bundle branch block is stable on EKG will continue to monitor with surveillance studies.  Of note she also has upcoming ablation procedure scheduled.       Dispo: Patient to return to clinic to see MD/APP in 5 to 6 months or sooner if needed for reevaluation  Signed, Gayathri Futrell, NP

## 2024-07-31 NOTE — Patient Instructions (Signed)
 Medication Instructions:  Your physician recommends that you continue on your current medications as directed. Please refer to the Current Medication list given to you today.   *If you need a refill on your cardiac medications before your next appointment, please call your pharmacy*  Lab Work: Your provider would like for you to have following labs drawn today for ablation.   If you have labs (blood work) drawn today and your tests are completely normal, you will receive your results only by: MyChart Message (if you have MyChart) OR A paper copy in the mail If you have any lab test that is abnormal or we need to change your treatment, we will call you to review the results.  Testing/Procedures: No test ordered today   Follow-Up: At [facility], you and your health needs are our priority.  As part of our continuing mission to provide you with exceptional heart care, our providers are all part of one team.  This team includes your primary Cardiologist (physician) and Advanced Practice Providers or APPs (Physician Assistants and Nurse Practitioners) who all work together to provide you with the care you need, when you need it.  Your next appointment:   5 month(s)  Provider:   You may see Timothy Gollan, MD or one of the following Advanced Practice Providers on your designated Care Team:   Lonni Meager, NP Lesley Maffucci, PA-C Bernardino Bring, PA-C Cadence Willamina, PA-C Tylene Lunch, NP Barnie Hila, NP

## 2024-08-01 LAB — CBC
Hematocrit: 40.1 % (ref 34.0–46.6)
Hemoglobin: 13 g/dL (ref 11.1–15.9)
MCH: 31.8 pg (ref 26.6–33.0)
MCHC: 32.4 g/dL (ref 31.5–35.7)
MCV: 98 fL — ABNORMAL HIGH (ref 79–97)
Platelets: 226 x10E3/uL (ref 150–450)
RBC: 4.09 x10E6/uL (ref 3.77–5.28)
RDW: 14.7 % (ref 11.7–15.4)
WBC: 6.6 x10E3/uL (ref 3.4–10.8)

## 2024-08-01 LAB — BASIC METABOLIC PANEL WITH GFR
BUN/Creatinine Ratio: 16 (ref 12–28)
BUN: 15 mg/dL (ref 8–27)
CO2: 20 mmol/L (ref 20–29)
Calcium: 9.8 mg/dL (ref 8.7–10.3)
Chloride: 101 mmol/L (ref 96–106)
Creatinine, Ser: 0.92 mg/dL (ref 0.57–1.00)
Glucose: 153 mg/dL — ABNORMAL HIGH (ref 70–99)
Potassium: 4.5 mmol/L (ref 3.5–5.2)
Sodium: 139 mmol/L (ref 134–144)
eGFR: 63 mL/min/1.73 (ref >59)

## 2024-08-04 ENCOUNTER — Ambulatory Visit: Payer: Self-pay | Admitting: Cardiology

## 2024-08-04 ENCOUNTER — Other Ambulatory Visit: Payer: Self-pay | Admitting: Family Medicine

## 2024-08-04 DIAGNOSIS — F409 Phobic anxiety disorder, unspecified: Secondary | ICD-10-CM

## 2024-08-04 NOTE — Progress Notes (Signed)
 Labs have remained stable.  Continue current medications at this time without changes needed.

## 2024-08-11 ENCOUNTER — Telehealth (HOSPITAL_COMMUNITY): Payer: Self-pay

## 2024-08-11 ENCOUNTER — Encounter (HOSPITAL_COMMUNITY): Payer: Self-pay

## 2024-08-11 NOTE — Telephone Encounter (Signed)
 Spoke with patient to complete pre-procedure call.     Health status review:  Any new medical conditions, recent signs of acute illness or been started on antibiotics? No Any recent hospitalizations or surgeries? No Any new medications started since pre-op visit? No  Follow all medication instructions prior to procedure or the procedure may be rescheduled:    Continue taking Eliquis  (Apixaban ) twice daily without missing any doses before procedure. Essential chronic medications:  No medication should be continued, unless told otherwise. On the morning of your procedure DO NOT take any medication., including Eliquis  (Apixaban ).  Nothing to eat or drink after midnight prior to your procedure.  Pre-procedure testing scheduled: CT not needed, lab work completed.   Confirmed patient is scheduled for Atrial Fibrillation Ablation on Friday, November 7 with Dr. Dr. Kennyth. Instructed patient to arrive at the Main Entrance A at [facility]: [address] [place], [place] [address] and check in at Admitting at 7:30 AM.  Plan to go home the same day, you will only stay overnight if medically necessary.. You MUST have a responsible adult to drive you home and MUST be with you the first 24 hours after you arrive home or your procedure could be cancelled.  Informed patient a nurse will call a day before the procedure to confirm arrival time and ensure instructions are followed.  Patient verbalized understanding to information provided and is agreeable to proceed with procedure.   Advised patient to contact RN Navigator at [phone], to inform of any new medications started after call or concerns prior to procedure.

## 2024-08-20 ENCOUNTER — Telehealth: Payer: Self-pay | Admitting: Cardiovascular Disease

## 2024-08-20 NOTE — Telephone Encounter (Signed)
 [facility] calling to ask if pt will need to do anything differently with her injections in regards to her upcoming ablation. Please advise.    Dr. Roland nurse  [facility] with [facility]  [phone]

## 2024-08-20 NOTE — Telephone Encounter (Signed)
 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  Patient is receiving T-VEC intralesional injections every 2 weeks, with an injection received today. Nurse inquires if OK for patient to receive her next injection on 11/12 or if she will need time to recovery? There were no concerns for swelling or erythema.    Per last OV with Dr. Kennyth on 8/11, patient was scheduled for a PET scan and CT scan the next week. She visits the cancer center every three months for surveillance imaging.    Patient is scheduled for Afib ablation on 11/7. Will send to Dr. Kennyth to advise on T-VEC injection or any additional recs.

## 2024-08-22 ENCOUNTER — Telehealth: Payer: Self-pay

## 2024-08-22 NOTE — Telephone Encounter (Signed)
 Spoke with the nurse at [facility] cancer center and advised that patient can proceed with injections. Her ablation has been cancelled until melanoma has been addressed.

## 2024-08-22 NOTE — Telephone Encounter (Addendum)
 Spoke with the patient and advised that Dr. Kennyth would like to cancel her ablation until melanoma is addressed. Patient verbalized understanding.

## 2024-08-22 NOTE — Telephone Encounter (Signed)
-----   Message from Fonda Kitty sent at 08/21/2024  7:12 PM EDT ----- Will need to delay ablation so she can get her recurrent melanoma addressed. Okay to cancel and place someone in her spot for 11/7.   Josh ----- Message ----- From: Smitty Jonel CROME Sent: 08/20/2024   4:49 PM EDT To: Renita Brocks Chauvigne, RN; Fonda Kitty, MD

## 2024-08-22 NOTE — Telephone Encounter (Signed)
  [name] from [facility] calling to follow up

## 2024-08-29 ENCOUNTER — Encounter (HOSPITAL_COMMUNITY): Payer: Self-pay

## 2024-08-29 ENCOUNTER — Ambulatory Visit (HOSPITAL_COMMUNITY): Admit: 2024-08-29 | Admitting: Cardiology

## 2024-08-29 SURGERY — ATRIAL FIBRILLATION ABLATION
Anesthesia: General

## 2024-09-30 ENCOUNTER — Ambulatory Visit

## 2024-09-30 VITALS — Ht 66.0 in | Wt 119.0 lb

## 2024-09-30 NOTE — Patient Instructions (Signed)
 [name],  Thank you for taking the time for your Medicare Wellness Visit. I appreciate your continued commitment to your health goals. Please review the care plan we discussed, and feel free to reach out if I can assist you further.  Please note that Annual Wellness Visits do not include a physical exam. Some assessments may be limited, especially if the visit was conducted virtually. If needed, we may recommend an in-person follow-up with your provider.  Ongoing Care Seeing your primary care provider every 3 to 6 months helps us  monitor your health and provide consistent, personalized care. Next office visit on 01/07/2025.  Keep up the good work.  Referrals If a referral was made during today's visit and you haven't received any updates within two weeks, please contact the referred provider directly to check on the status.  Recommended Screenings:  Health Maintenance  Topic Date Due   Medicare Annual Wellness Visit  06/04/2024   COVID-19 Vaccine (6 - 2025-26 season) 06/23/2024   DTaP/Tdap/Td vaccine (2 - Td or Tdap) 07/10/2033   Pneumococcal Vaccine for age over 74  Completed   Flu Shot  Completed   Osteoporosis screening with Bone Density Scan  Completed   Zoster (Shingles) Vaccine  Completed   Meningitis B Vaccine  Aged Out   Breast Cancer Screening  Discontinued   Hepatitis C Screening  Discontinued       09/30/2024    1:26 PM  Advanced Directives  Does Patient Have a Medical Advance Directive? Yes  Type of Estate Agent of [place];Living will;Out of facility DNR (pink MOST or yellow form)  Copy of Healthcare Power of Attorney in Chart? No - copy requested    Vision: Annual vision screenings are recommended for early detection of glaucoma, cataracts, and diabetic retinopathy. These exams can also reveal signs of chronic conditions such as diabetes and high blood pressure.  Dental: Annual dental screenings help detect early signs of oral cancer, gum  disease, and other conditions linked to overall health, including heart disease and diabetes.  Please see the attached documents for additional preventive care recommendations.

## 2024-09-30 NOTE — Progress Notes (Signed)
 Chief Complaint  Patient presents with   Medicare Wellness     Subjective:   [name] is a 83 y.o. female who presents for a Medicare Annual Wellness Visit.  Visit info / Clinical Intake: Medicare Wellness Visit Type:: Subsequent Annual Wellness Visit Persons participating in visit and providing information:: patient Medicare Wellness Visit Mode:: Telephone If telephone:: video declined Since this visit was completed virtually, some vitals may be partially provided or unavailable. Missing vitals are due to the limitations of the virtual format.: Documented vitals are patient reported If Telephone or Video please confirm:: I connected with patient using audio/video enable telemedicine. I verified patient identity with two identifiers, discussed telehealth limitations, and patient agreed to proceed. Patient Location:: Home Provider Location:: Home Interpreter Needed?: No Pre-visit prep was completed: yes AWV questionnaire completed by patient prior to visit?: no Living arrangements:: lives with spouse/significant other Patient's Overall Health Status Rating: (!) fair Typical amount of pain: none Does pain affect daily life?: no Are you currently prescribed opioids?: no  Dietary Habits and Nutritional Risks How many meals a day?: 3 Eats fruit and vegetables daily?: yes Most meals are obtained by: preparing own meals In the last 2 weeks, have you had any of the following?: none Diabetic:: no  Functional Status Activities of Daily Living (to include ambulation/medication): Independent Ambulation: Independent with device- listed below Home Assistive Devices/Equipment: Cane; Shower/tub chair; Elevated toliet seat Medication Administration: Independent Home Management (perform basic housework or laundry): Independent Manage your own finances?: yes Primary transportation is: driving Concerns about vision?: no *vision screening is required for WTM* Concerns about hearing?:  no  Fall Screening Falls in the past year?: 0 Number of falls in past year: 0 Was there an injury with Fall?: 0 Fall Risk Category Calculator: 0 Patient Fall Risk Level: Low Fall Risk  Fall Risk Patient at Risk for Falls Due to: No Fall Risks Fall risk Follow up: Falls evaluation completed; Falls prevention discussed  Home and Transportation Safety: All rugs have non-skid backing?: yes All stairs or steps have railings?: yes (basement stairs) Grab bars in the bathtub or shower?: yes Have non-skid surface in bathtub or shower?: yes Good home lighting?: yes Regular seat belt use?: yes Hospital stays in the last year:: (!) yes How many hospital stays:: 1 (for 3 days) Reason: Afib- 2 Cardio version  Cognitive Assessment Difficulty concentrating, remembering, or making decisions? : yes (remembering) Will 6CIT or Mini Cog be Completed: yes What year is it?: 0 points What month is it?: 0 points Give patient an address phrase to remember (5 components): [address] [place], [place] About what time is it?: 0 points Count backwards from 20 to 1: 0 points Say the months of the year in reverse: 0 points Repeat the address phrase from earlier: 0 points 6 CIT Score: 0 points  Advance Directives (For Healthcare) Does Patient Have a Medical Advance Directive?: Yes Does patient want to make changes to medical advance directive?: No - Patient declined Type of Advance Directive: Healthcare Power of [place]; Living will; Out of facility DNR (pink MOST or yellow form) Copy of Healthcare Power of Attorney in Chart?: No - copy requested Copy of Living Will in Chart?: No - copy requested Out of facility DNR (pink MOST or yellow form) in Chart? (Ambulatory ONLY): No - copy requested  Reviewed/Updated  Reviewed/Updated: Reviewed All (Medical, Surgical, Family, Medications, Allergies, Care Teams, Patient Goals)    Allergies (verified) Chocolate, Penicillins, Codeine, Erythromycin, and Septra  [  sulfamethoxazole-trimethoprim]   Current Medications (verified) Outpatient Encounter Medications as of 09/30/2024  Medication Sig   acetaminophen  (TYLENOL ) 650 MG CR tablet Take 650-1,300 mg by mouth every 8 (eight) hours as needed for pain.    amiodarone  (PACERONE ) 200 MG tablet Take 1 tablet (200 mg total) by mouth 2 (two) times daily.   amLODipine  (NORVASC ) 5 MG tablet TAKE 1 TABLET(5 MG) BY MOUTH DAILY   busPIRone  (BUSPAR ) 5 MG tablet Take 1 tablet (5 mg total) by mouth 3 (three) times daily. (Patient taking differently: Take 5 mg by mouth 2 (two) times daily.)   cholecalciferol  (VITAMIN D ) 1000 UNITS tablet Take 2,000 Units by mouth daily.   Cranberry 500 MG TABS Take 2 tablets by mouth daily.   doxazosin  (CARDURA ) 1 MG tablet Take 1 tablet (1 mg total) by mouth daily.   ELIQUIS  2.5 MG TABS tablet TAKE 1 TABLET(2.5 MG) BY MOUTH TWICE DAILY   furosemide  (LASIX ) 20 MG tablet TAKE 1 TABLET(20 MG) BY MOUTH DAILY   levothyroxine  (SYNTHROID ) 137 MCG tablet TAKE 1 TABLET(137 MCG) BY MOUTH DAILY BEFORE BREAKFAST   loratadine  (CLARITIN ) 10 MG tablet Take 10 mg by mouth daily as needed for allergies or rhinitis.   Multiple Vitamin (MULITIVITAMIN WITH MINERALS) TABS Take 1 tablet by mouth daily.   potassium chloride  (KLOR-CON ) 10 MEQ tablet TAKE 1 TABLET BY MOUTH EVERY DAY AS DIRECTED. MAY TAKE AN EXTRA AFTER LUNCH WITH FLUID PILL AS NEEDED FOR SWELLING   rosuvastatin  (CRESTOR ) 40 MG tablet Take 1 tablet (40 mg total) by mouth daily.   temazepam  (RESTORIL ) 15 MG capsule TAKE 1 CAPSULE(15 MG) BY MOUTH AT BEDTIME   No facility-administered encounter medications on file as of 09/30/2024.    History: Past Medical History:  Diagnosis Date   Anxiety    Bronchitis    Cancer (HCC)    melanoma to RLE   Chronic combined systolic (congestive) and diastolic (congestive) heart failure (HCC)    a. 02/2018 Echo: EF 45-50%, diff HK, triv AI, mod MR, mildly dil LA/RA, nl RV fxn, mod TR. PASP 40-53mmHg.    Chronic kidney disease    DJD (degenerative joint disease), cervical    History of stress test    a. 01/2008 MV: EF 76%. Fair ex tol. No ischemia.   Hyperlipidemia    Hypertension    Hypothyroid    LBBB (left bundle branch block)    Leg pain    a. 01/2018 ABI's wnl.   Lumbar spondylosis    Persistent atrial fibrillation (HCC)    a. 01/2018 Event monitor: 45 runs of SVT, longest 14.5 sec. SVT felt to be Afib/flutter-->2% burden. Longest run of AF 4h 28m (CHA2DS2VASc = 5-->Xarelto ); b. 03/2019 s/p DCCV; c. 04/2019 Repeat Amio load and DCCV.   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   Past Surgical History:  Procedure Laterality Date   BACK SURGERY  07/2019   CARDIOVERSION N/A 03/26/2018   Procedure: CARDIOVERSION;  Surgeon: Mady Bruckner, MD;  Location: [facility] OR[facility];  Service: Cardiovascular;  Laterality: N/A;   CARDIOVERSION N/A 04/24/2018   Procedure: CARDIOVERSION;  Surgeon: Perla Evalene PARAS, MD;  Location: [facility] OR[facility];  Service: Cardiovascular;  Laterality: N/A;   CARDIOVERSION N/A 05/06/2024   Procedure: CARDIOVERSION;  Surgeon: Perla Evalene PARAS, MD;  Location: [facility] OR[facility];  Service: Cardiovascular;  Laterality: N/A;   CATARACT EXTRACTION W/PHACO Right 07/12/2021   Procedure: CATARACT EXTRACTION PHACO AND INTRAOCULAR LENS PLACEMENT (IOC) RIGHT 16.92 01:30.8 ;  Surgeon: Jaye Fallow, MD;  Location: [facility];  Service: Ophthalmology;  Laterality: Right;   ELECTROPHYSIOLOGY STUDY N/A 02/27/2012   Procedure: ELECTROPHYSIOLOGY STUDY;  Surgeon: Danelle LELON Birmingham, MD;  Location: [facility] C[facility];  Service: Cardiovascular;  Laterality: N/A;   EPS and ablation for SVT  5/13   slow pathway ablation by Dr Birmingham   EYE SURGERY     surgery for detatched retina   NECK SURGERY  02/2020   SUPRAVENTRICULAR TACHYCARDIA ABLATION N/A 02/27/2012   Procedure: SUPRAVENTRICULAR TACHYCARDIA ABLATION;  Surgeon: Danelle LELON Birmingham, MD;  Location: [facility] CATH LAB;  Service:  Cardiovascular;  Laterality: N/A;   TEE WITHOUT CARDIOVERSION N/A 10/19/2021   Procedure: TRANSESOPHAGEAL ECHOCARDIOGRAM (TEE);  Surgeon: Darliss Rogue, MD;  Location: [facility] OR[facility];  Service: Cardiovascular;  Laterality: N/A;   Family History  Problem Relation Age of Onset   Alzheimer's disease Mother    Stroke Father    Breast cancer Father    Heart disease Father        Pacemaker   Kidney cancer Maternal Grandmother    Alzheimer's disease Maternal Grandfather    Thyroid  disease Paternal Grandmother    Stroke Paternal Grandfather    Breast cancer Paternal Aunt    Cancer Daughter 47       ovarian cancer   Social History   Occupational History   Occupation: Retired  Tobacco Use   Smoking status: Never   Smokeless tobacco: Never   Tobacco comments:    smoking cessation materials not required  Vaping Use   Vaping status: Never Used  Substance and Sexual Activity   Alcohol use: No   Drug use: No   Sexual activity: Not Currently    Partners: Male    Birth control/protection: Post-menopausal   Tobacco Counseling Counseling given: Not Answered Tobacco comments: smoking cessation materials not required  SDOH Screenings   Food Insecurity: No Food Insecurity (09/30/2024)  Housing: Unknown (09/30/2024)  Transportation Needs: No Transportation Needs (09/30/2024)  Utilities: Not At Risk (09/30/2024)  Alcohol Screen: Low Risk  (05/30/2022)  Depression (PHQ2-9): Low Risk  (09/30/2024)  Financial Resource Strain: Low Risk  (06/05/2023)  Physical Activity: Insufficiently Active (09/30/2024)  Social Connections: Socially Integrated (09/30/2024)  Stress: No Stress Concern Present (09/30/2024)  Tobacco Use: Low Risk  (09/30/2024)  Health Literacy: Adequate Health Literacy (09/30/2024)   See flowsheets for full screening details  Depression Screen PHQ 2 & 9 Depression Scale- Over the past 2 weeks, how often have you been bothered by any of the following problems? Little interest or  pleasure in doing things: 0 Feeling down, depressed, or hopeless (PHQ Adolescent also includes...irritable): 1 (sometimes feeling down due to cancer DX-per pt) PHQ-2 Total Score: 1 Trouble falling or staying asleep, or sleeping too much: 0 Feeling tired or having little energy: 1 Poor appetite or overeating (PHQ Adolescent also includes...weight loss): 0 Feeling bad about yourself - or that you are a failure or have let yourself or your family down: 0 Trouble concentrating on things, such as reading the newspaper or watching television (PHQ Adolescent also includes...like school work): 0 Moving or speaking so slowly that other people could have noticed. Or the opposite - being so fidgety or restless that you have been moving around a lot more than usual: 0 Thoughts that you would be better off dead, or of hurting yourself in some way: 0 PHQ-9 Total Score: 2 If you checked off any problems, how difficult have these problems made it for you to do  your work, take care of things at home, or get along with other people?: Not difficult at all  Depression Treatment Depression Interventions/Treatment : EYV7-0 Score <4 Follow-up Not Indicated     Goals Addressed               This Visit's Progress     Patient Stated (pt-stated)        Not at this time/2025             Objective:    Today's Vitals   09/30/24 1322  Weight: 119 lb (54 kg)  Height: 5' 6 (1.676 m)   Body mass index is 19.21 kg/m.  Hearing/Vision screen Hearing Screening - Comments:: Denies hearing difficulties   Vision Screening - Comments:: [facility]/Dr. Porfillo/UTD Immunizations and Health Maintenance Health Maintenance  Topic Date Due   COVID-19 Vaccine (6 - 2025-26 season) 06/23/2024   Medicare Annual Wellness (AWV)  09/30/2025   DTaP/Tdap/Td (2 - Td or Tdap) 07/10/2033   Pneumococcal Vaccine: 50+ Years  Completed   Influenza Vaccine  Completed   Bone Density Scan  Completed   Zoster Vaccines-  Shingrix  Completed   Meningococcal B Vaccine  Aged Out   Mammogram  Discontinued   Hepatitis C Screening  Discontinued        Assessment/Plan:  This is a routine wellness examination for Ronisha.  Patient Care Team: Sowles, Krichna, MD as PCP - General (Family Medicine) Perla Evalene PARAS, MD as PCP - Cardiology (Cardiology) Kennyth Chew, MD as PCP - Electrophysiology (Cardiology) Donette Ellouise LABOR, FNP as Consulting Physician (Family Medicine) Cathlyn Seal, MD (Dermatology) Perla Evalene PARAS, MD as Consulting Physician (Cardiology) Gillie Duncans, MD as Consulting Physician (Neurosurgery) Francisca Redell BROCKS, MD as Consulting Physician (Urology) Jaye Fallow, MD as Referring Physician (Ophthalmology) Darlis Deatrice RAMAN, MD as Consulting Physician (Pain Medicine) Gerard Frederick, NP as Nurse Practitioner (Cardiology)  I have personally reviewed and noted the following in the patient's chart:   Medical and social history Use of alcohol, tobacco or illicit drugs  Current medications and supplements including opioid prescriptions. Functional ability and status Nutritional status Physical activity Advanced directives List of other physicians Hospitalizations, surgeries, and ER visits in previous 12 months Vitals Screenings to include cognitive, depression, and falls Referrals and appointments  No orders of the defined types were placed in this encounter.  In addition, I have reviewed and discussed with patient certain preventive protocols, quality metrics, and best practice recommendations. A written personalized care plan for preventive services as well as general preventive health recommendations were provided to patient.   Tavionna Grout L Yazlynn Birkeland, CMA   09/30/2024   Return in 1 year (on 09/30/2025).  After Visit Summary: (MyChart) Due to this being a telephonic visit, the after visit summary with patients personalized plan was offered to patient via MyChart   Nurse Notes:  Patient is up to date on all health maintenance with no concerns to address today.

## 2024-10-24 ENCOUNTER — Other Ambulatory Visit: Payer: Self-pay | Admitting: Family Medicine

## 2024-10-24 DIAGNOSIS — F411 Generalized anxiety disorder: Secondary | ICD-10-CM

## 2024-10-24 NOTE — Telephone Encounter (Signed)
 Requested Prescriptions  Pending Prescriptions Disp Refills   busPIRone  (BUSPAR ) 5 MG tablet [Pharmacy Med Name: BUSPIRONE  5MG  TABLETS] 270 tablet 1    Sig: TAKE 1 TABLET(5 MG) BY MOUTH THREE TIMES DAILY     Psychiatry: Anxiolytics/Hypnotics - Non-controlled Passed - 10/24/2024  2:49 PM      Passed - Valid encounter within last 12 months    Recent Outpatient Visits           3 months ago Chronic combined systolic (congestive) and diastolic (congestive) heart failure [facility])   [place] [facility] Glenard Mire, MD   9 months ago AF (paroxysmal atrial fibrillation) [facility])   [place] [facility] Glenard Mire, MD       Future Appointments             In 2 months Gerard Frederick, NP  HeartCare at [facility]

## 2024-11-03 ENCOUNTER — Other Ambulatory Visit: Payer: Self-pay | Admitting: Family Medicine

## 2024-11-03 DIAGNOSIS — F409 Phobic anxiety disorder, unspecified: Secondary | ICD-10-CM

## 2024-11-04 NOTE — Telephone Encounter (Signed)
 Requested medication (s) are due for refill today - yes  Requested medication (s) are on the active medication list -yes  Future visit scheduled -yes  Last refill: 08/05/24 #90  Notes to clinic: non delegated Rx  Requested Prescriptions  Pending Prescriptions Disp Refills   temazepam  (RESTORIL ) 15 MG capsule [Pharmacy Med Name: TEMAZEPAM  15MG  CAPSULES] 90 capsule     Sig: TAKE 1 CAPSULE(15 MG) BY MOUTH AT BEDTIME     Not Delegated - Psychiatry: Anxiolytics/Hypnotics 2 Failed - 11/04/2024  1:08 PM      Failed - This refill cannot be delegated      Failed - Urine Drug Screen completed in last 360 days      Passed - Patient is not pregnant      Passed - Valid encounter within last 6 months    Recent Outpatient Visits           3 months ago Chronic combined systolic (congestive) and diastolic (congestive) heart failure [facility])   [place] [facility] [place], [place], MD   9 months ago AF (paroxysmal atrial fibrillation) [facility])   [place] [facility] Glenard Dorette, MD       Future Appointments             In 2 months Gerard Frederick, NP [place] [facility]               Requested Prescriptions  Pending Prescriptions Disp Refills   temazepam  (RESTORIL ) 15 MG capsule [Pharmacy Med Name: TEMAZEPAM  15MG  CAPSULES] 90 capsule     Sig: TAKE 1 CAPSULE(15 MG) BY MOUTH AT BEDTIME     Not Delegated - Psychiatry: Anxiolytics/Hypnotics 2 Failed - 11/04/2024  1:08 PM      Failed - This refill cannot be delegated      Failed - Urine Drug Screen completed in last 360 days      Passed - Patient is not pregnant      Passed - Valid encounter within last 6 months    Recent Outpatient Visits           3 months ago Chronic combined systolic (congestive) and diastolic (congestive) heart failure [facility])   [place] [facility] Glenard Dorette, MD   9 months ago AF (paroxysmal atrial fibrillation) [facility])   [place]  [facility] Glenard Dorette, MD       Future Appointments             In 2 months Gerard Frederick, NP [place] [facility]

## 2024-11-13 ENCOUNTER — Telehealth: Payer: Self-pay | Admitting: Cardiovascular Disease

## 2024-11-13 NOTE — Telephone Encounter (Signed)
 Called patient - denies any symptoms other than dizziness, low BP and elevated HR  BP 115/57  HR 105  currently - has taken meds  Pt is also having kytruda cancer [facility] at [facility]    Pt is taking 1 tablet (200 mg) amiodarone  daily  Patient is feeling dizzy if she is up moving about the house - advised caution - she denies passing out  Will send to pharmacy to check medication interactions  Please advise

## 2024-11-13 NOTE — Telephone Encounter (Signed)
 She has a history of atrial fibrillation and SVT status post ablation.  With having lower blood pressures she can hold her amlodipine  until her blood pressure is greater then 120 systolic.  Make sure that she is maintaining adequate hydration as she may have converted back into atrial fibrillation.  Check to see if we have availability to do a nurse visit for an EKG.

## 2024-11-13 NOTE — Telephone Encounter (Signed)
 STAT if HR is under 50 or over 120 (normal HR is 60-100 beats per minute)  What is your heart rate? HR-110  Do you have a log of your heart rate readings (document readings)? 11/11/24 - HR 109 1/21- HR 97, 102  Do you have any other symptoms? Low BP   Pt c/o BP issue: STAT if pt c/o blurred vision, one-sided weakness or slurred speech.  STAT if BP is GREATER than 180/120 TODAY.  STAT if BP is LESS than 90/60 and SYMPTOMATIC TODAY  1. What is your BP concern? Low BP  2. Have you taken any BP medication today?No  3. What are your last 5 BP readings? 110/62, 92/61,127/70,99/66,112/63  4. Are you having any other symptoms (ex. Dizziness, headache, blurred vision, passed out)? Dizziness

## 2024-11-14 NOTE — Telephone Encounter (Signed)
 She should hold it if her blood pressure is less than 100 mmHg systolic

## 2024-11-14 NOTE — Telephone Encounter (Signed)
 Called patient to advise re: doxy at dinner

## 2024-11-14 NOTE — Telephone Encounter (Signed)
 Called patient to review advice - patient verbalized understanding - Nurse visit for EKG 11/19/24 at 2:00 pm

## 2024-11-19 ENCOUNTER — Ambulatory Visit: Payer: Self-pay | Admitting: Cardiology

## 2024-11-19 ENCOUNTER — Ambulatory Visit: Attending: Internal Medicine | Admitting: Internal Medicine

## 2024-11-19 ENCOUNTER — Other Ambulatory Visit: Payer: Self-pay

## 2024-11-19 ENCOUNTER — Encounter: Payer: Self-pay | Admitting: Internal Medicine

## 2024-11-19 ENCOUNTER — Ambulatory Visit

## 2024-11-19 VITALS — BP 124/60 | HR 115 | Ht 66.0 in | Wt 121.4 lb

## 2024-11-19 VITALS — BP 146/80 | HR 115 | Ht 66.0 in | Wt 121.0 lb

## 2024-11-19 DIAGNOSIS — I5020 Unspecified systolic (congestive) heart failure: Secondary | ICD-10-CM | POA: Insufficient documentation

## 2024-11-19 DIAGNOSIS — I10 Essential (primary) hypertension: Secondary | ICD-10-CM

## 2024-11-19 DIAGNOSIS — I4819 Other persistent atrial fibrillation: Secondary | ICD-10-CM

## 2024-11-19 MED ORDER — AMIODARONE HCL 200 MG PO TABS
200.0000 mg | ORAL_TABLET | Freq: Two times a day (BID) | ORAL | 2 refills | Status: AC
  Filled 2024-11-19: qty 180, 90d supply, fill #0

## 2024-11-19 MED ORDER — METOPROLOL TARTRATE 25 MG PO TABS
12.5000 mg | ORAL_TABLET | Freq: Two times a day (BID) | ORAL | 3 refills | Status: AC
  Filled 2024-11-19: qty 90, 90d supply, fill #0

## 2024-11-19 NOTE — Progress Notes (Signed)
 EKG today reveals patient is back in atrial fibrillation.  Continue with appointment with Dr. Mady at this afternoon at 3:20 for further recommendations and medication changes.

## 2024-11-19 NOTE — Progress Notes (Addendum)
 " Cardiology Office Note:  .   Date:  11/19/2024  ID:  ALESSANDRA SAWDEY, DOB 29-Apr-1941, MRN [id number] PCP: Glenard Mire, MD   [facility] Providers Cardiologist:  Evalene Lunger, MD Cardiology APP:  Gerard Frederick, NP  Electrophysiologist:  Fonda Kitty, MD     History of Present Illness: .   GENEAN ADAMSKI is a 84 y.o. 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  Who was seen for urgent evaluation of fatigue and elevated heart rates.  Nyquist reports that she had been doing fairly well up until she received her first treatment of Keytruda around January 7.  She subsequently developed arthralgias, including worsening upper back pain that sometimes radiates into her chest.  She was told that this is an expected side effect of Keytruda and apply ice to her joints for pain relief.  A week ago yesterday, she suddenly noticed that her heart was racing, similar to what she has experienced in the past with atrial fibrillation.  She has also felt more fatigued with this.  Home blood pressures have been somewhat low at times, though her last reading that she checked earlier today was 125/61.  She was told by our office earlier this week to hold her amlodipine  for SBP less than 120 and doxazosin  for SBP less than 100.  Ms. Heckman notes that she has only been taking amiodarone  200 mg daily instead of twice daily, as that is what was written on her bottle when she received a refill last month.  She reports being compliant with apixaban  2.5 mg twice daily.  She has not had any bleeding.  She is scheduled for her next oncology follow-up  and Keytruda treatment tomorrow at [facility].  ROS: See HPI  Studies Reviewed: SABRA   ECG (11/19/2024): Atrial fibrillation with rapid ventricular response (heart rate 115 bpm), left axis deviation, and left bundle branch block.  Compared to prior tracing from 07/31/2024, atrial fibrillation has replaced sinus rhythm.  TTE (05/03/2024): Normal LV size with mild LVH.  LVEF 50-55% with normal wall motion and indeterminate diastolic function.  Normal RV size and function.  Moderate left atrial enlargement.  Mild-moderate right atrial enlargement.  No pericardial effusion.  Mild mitral and mild-moderate tricuspid regurgitation.  Normal CVP.  Risk Assessment/Calculations:    CHA2DS2-VASc Score = 6   This indicates a 9.7% annual risk of stroke. The patient's score is based upon: CHF History: 1 HTN History: 1 Diabetes History: 0 Stroke History: 0 Vascular Disease History: 1 Age Score: 2 Gender Score: 1    HYPERTENSION CONTROL Vitals:   11/19/24 1542  BP: (!) 146/80    The patient's blood pressure is elevated above target today.  In order to address the patient's elevated BP: A current anti-hypertensive medication was adjusted today.          Physical Exam:   VS:  BP (!) 146/80 (BP Location: Left Arm, Patient Position: Sitting, Cuff Size: Normal)   Pulse (!) 115   Ht 5' 6 (1.676 m)   Wt 121 lb (54.9 kg)   SpO2 98%   BMI 19.53 kg/m    Wt Readings from Last 3 Encounters:  11/19/24 121 lb (54.9 kg)  11/19/24 121 lb 6.4 oz (55.1 kg)  09/30/24 119 lb (54 kg)    General:  NAD.  Accompanied by her husband. Neck: No JVD or HJR. Lungs: Clear to auscultation bilaterally without wheezes or crackles. Heart: Irregularly irregular rhythm with 1/6 systolic murmur. Abdomen: Soft, nontender, nondistended. Extremities: No lower extremity edema.  ASSESSMENT AND PLAN: .    Persistent atrial fibrillation with rapid ventricular response: Ms. Lorenzi has a history of persistent atrial fibrillation  having undergone cardioversion twice last year.  She has been on amiodarone  200 mg twice daily with improved rhythm control, though there was concern about long-term side effects of this.  She was scheduled to undergo PVI with Dr. Kennyth in 08/2024, but this was deferred following diagnosis of her melanoma.  Query if recent initiation of Keytruda as well as inadvertent de-escalation of amiodarone  from 200 mg twice daily to 200 mg daily may have contributed to recurrent atrial fibrillation.  Her heart rates are only mildly elevated today.  She is also normotensive to hypertensive in the office.  I have recommended increasing amiodarone  back to 200 mg twice daily.  We will also add metoprolol  tartrate 12.5 mg twice daily for rate control.  It should be noted that she has a history of sinus bradycardia; she will monitor her heart rates at home and discontinue the metoprolol  if her heart rate suddenly drops below 60 bpm.  Continue anticoagulation with apixaban  2.5 mg twice daily.  Most recent labs at [facility] on 10/29/2024 showed normal hemoglobin, platelets, TSH, potassium, and creatinine.  Heart failure with recovered ejection fraction: Most recent echocardiogram in 04/2024 showed low normal LVEF.  Suspect recent fatigue could be due to Keytruda treatment as well as recurrent atrial fibrillation.  Otherwise, Ms. Brossman has not had any other signs or symptoms of overt heart failure.  Continue furosemide  20 mg p.o. daily.  If symptoms worsen, repeat echocardiogram will need to be pursued.  Hypertension: Blood pressure normal to mildly elevated today.  Continue current regimen of amlodipine  and doxazosin .  Will add low-dose metoprolol , as above, for heart rate control.    Dispo: Follow-up with Dr. Kerby Needle, NP, next week  Signed, Lonni Hanson, MD   "

## 2024-11-19 NOTE — Progress Notes (Signed)
" ° °  Nurse Visit   Date of Encounter: 11/19/2024 ID: KENSLIE ABBRUZZESE, DOB September 12, 1941, MRN [id number]  PCP:  Glenard Mire, MD   [facility] Providers Cardiologist:  Evalene Lunger, MD Cardiology APP:  Gerard Frederick, NP  Electrophysiologist:  Fonda Kitty, MD      Visit Details   VS:  BP 124/60 (BP Location: Left Arm, Patient Position: Sitting, Cuff Size: Normal)   Pulse (!) 115   Ht 5' 6 (1.676 m)   Wt 121 lb 6.4 oz (55.1 kg)   SpO2 98%   BMI 19.59 kg/m  , BMI Body mass index is 19.59 kg/m.  Wt Readings from Last 3 Encounters:  11/19/24 121 lb 6.4 oz (55.1 kg)  09/30/24 119 lb (54 kg)  07/31/24 125 lb 9.6 oz (57 kg)     Reason for visit: EKG changes Performed today: Vitals, EKG, Provider consulted:Dr. End, and Education Changes (medications, testing, etc.) : OV following nurse visit Length of Visit: 30 minutes    Medications Adjustments/Labs and Tests Ordered: Orders Placed This Encounter  Procedures   EKG 12-Lead   No orders of the defined types were placed in this encounter.    Signed, Jon JONETTA Gasmen, RN  11/19/2024 2:21 PM  "

## 2024-11-19 NOTE — Patient Instructions (Signed)
 Medication Instructions:  Your physician recommends the following medication changes.  START TAKING: Metoprolol  tartrate 12.5 mg by mouth twice daily   INCREASE: Amiodarone  to 200 mg by mouth twice a daily     *If you need a refill on your cardiac medications before your next appointment, please call your pharmacy*  Lab Work: No labs ordered today    Testing/Procedures: No test ordered today   Follow-Up: At [facility], you and your health needs are our priority.  As part of our continuing mission to provide you with exceptional heart care, our providers are all part of one team.  This team includes your primary Cardiologist (physician) and Advanced Practice Providers or APPs (Physician Assistants and Nurse Practitioners) who all work together to provide you with the care you need, when you need it.  Your next appointment:   1 week(s)  Provider:   Fonda Kitty, MD or Suzann Riddle, NP

## 2024-11-26 NOTE — Progress Notes (Unsigned)
 "     Electrophysiology Clinic Note    Date:  11/27/2024  Patient ID:  [name], [name] 10/06/1941, MRN [id number] PCP:  Glenard Mire, MD  Cardiologist:  Evalene Lunger, MD  Cardiology APP:  Gerard Frederick, NP  Electrophysiologist:  Fonda Kitty, MD  Electrophysiology APP:  Mataya Kilduff, NP     Discussed the use of AI scribe software for clinical note transcription with the patient, who gave verbal consent to proceed.   Patient Profile    Chief Complaint: AFib  History of Present Illness: [name] is a 84 y.o. female with PMH notable for AVNRT ablation, persis AFib, SND, tachy-brady, LBBB, HTN, HLD, PAD, malignant melanoma currently on chemo; seen today for Fonda Kitty, MD for acute visit due to AFib.    She saw Dr. Kitty 05/2024 to discuss the treatment of her atrial fibrillation, and ablation was scheduled.  Unfortunately this was canceled due to her malignant melanoma diagnosis.  She saw Dr. Mady last week for DOD appt for AFib. She rec'd first Keytruda treatment the week prior. It was also noted that she inadvertently reduced amiodarone  to 200mg  daily from BID. At the visit, amio was increased to BID, and low dose toprol  was started.  She saw [facility] the day after, labs notable for elevated LFTs, planning repeat labs this week, and held next Keytruda dose.   Today, she continues to feel overall unwell. She intermittently feels palpitations and heart beating hard in her chest. Has joint pain.  She has diligently checked BP and pulse at home, most BP readings 90-110s systolic with pulse in 80-100s. She continues to take doxazosin  most days, skips it for systolic less than 100.  Continues to take eliquis  BID, no bleeding concerns.     Arrhythmia/Device History Amiodarone  - started ~2018    ROS:  Please see the history of present illness. All other systems are reviewed and otherwise negative.    Physical Exam    VS:  BP 120/60 (BP Location: Left Arm,  Patient Position: Sitting, Cuff Size: Normal)   Pulse (!) 106   Ht 5' 6 (1.676 m)   Wt 125 lb (56.7 kg)   SpO2 97%   BMI 20.18 kg/m  BMI: Body mass index is 20.18 kg/m.           Wt Readings from Last 3 Encounters:  11/27/24 125 lb (56.7 kg)  11/19/24 121 lb (54.9 kg)  11/19/24 121 lb 6.4 oz (55.1 kg)     GEN- The patient is well appearing, alert and oriented x 3 today.   Lungs- Clear to ausculation bilaterally, normal work of breathing.  Heart- Irregularly irregular rate and rhythm, no murmurs, rubs or gallops Extremities- No peripheral edema, warm, dry   Studies Reviewed   Previous EP, cardiology notes.    EKG is ordered. Personal review of EKG from today shows:    EKG Interpretation Date/Time:  Thursday November 27 2024 09:12:48 EST Ventricular Rate:  106 PR Interval:    QRS Duration:  168 QT Interval:  422 QTC Calculation: 560 R Axis:   -42  Text Interpretation: Atrial fibrillation Left axis deviation Left bundle branch block Confirmed by Bayleigh Loflin [phone]) on 11/27/2024 9:17:19 AM     TTE, 05/08/2024  1. Left ventricular ejection fraction, by estimation, is 50 to 55%. The left ventricle has low normal function. The left ventricle has no regional wall motion abnormalities. There is mild left ventricular hypertrophy. Left ventricular diastolic parameters are indeterminate.  2. Right ventricular systolic function is normal. The right ventricular size is normal.   3. Left atrial size was moderately dilated.   4. Right atrial size was mild to moderately dilated.   5. The mitral valve is normal in structure. Mild mitral valve regurgitation.   6. Tricuspid valve regurgitation is mild to moderate.   7. The aortic valve is tricuspid. Aortic valve regurgitation is not visualized.   8. The inferior vena cava is normal in size with greater than 50% respiratory variability, suggesting right atrial pressure of 3 mmHg.   TTE, 07/24/2021  1. Left ventricular ejection  fraction, by estimation, is 55 to 60%. The left ventricle has normal function. The left ventricle has no regional wall motion abnormalities. There is moderate left ventricular hypertrophy. Left ventricular diastolic parameters are consistent with Grade I diastolic dysfunction (impaired relaxation).   2. Right ventricular systolic function is normal. The right ventricular size is normal. There is normal pulmonary artery systolic pressure.   3. The mitral valve is abnormal. Mild mitral valve regurgitation. No evidence of mitral stenosis.   4. Tricuspid valve regurgitation is mild to moderate.   5. The aortic valve is tricuspid. There is mild calcification of the aortic valve. There is mild thickening of the aortic valve. Aortic valve regurgitation is mild. Mild to moderate aortic valve sclerosis/calcification is present, without any evidence of aortic stenosis.   6. The inferior vena cava is normal in size with greater than 50% respiratory variability, suggesting right atrial pressure of 3 mmHg.    Assessment and Plan     #) persis Afib #) bradycardia #) tachy-brady #) amiodarone  monitoring Afib was well-controlled on amiodarone  prior to initiation of Keytrude for melanoma, 1st dose 10/29/24 Appears to be in persis AFib since 1/20 by BP/pulse log She does not feel significantly worse since starting low dose metoprolol . Will cautiously increase. I've asked her to increase PM dose of lopressor  to 25mg  and continue 12.5mg  in AM. If tolerates this, will increase to 25mg  BID Continue 200mg  amiodarone  BID for 2 additional weeks.  If she remains in Afib, consider DCCV at follow-up LFTs slightly elevated on recent labs at [facility], repeating labs today. I will follow those results.    #) Hypercoag d/t persis afib CHA2DS2-VASc Score = at least 6 [CHF History: 1, HTN History: 1, Diabetes History: 0, Stroke History: 0, Vascular Disease History: 1, Age Score: 2, Gender Score: 1].  Therefore, the patient's annual  risk of stroke is 9.7 %.    Stroke ppx - 2.5mg  eliquis , appropriately dosed No bleeding concerns   #) HTN #) hypotension Continue to hold amlodipine  Hold doxazosin  for systolic BP less than 120 Increasing lopressor  as above Increase fluid and sodium intake in diet       Current medicines are reviewed at length with the patient today.   The patient has concerns regarding her medicines.  The following changes were made today:   INCREASE lopressor  to 12.5mg  am / 25mg  PM x 1 week. If tolerates, increase lopressor  to 25mg  BID HOLD doxazosin  for systolic BP < CONTINUE 200mg  amiodarone  BID x 2 weeks, then reduce to 200mg  daily   Labs/ tests ordered today include:  Orders Placed This Encounter  Procedures   EKG 12-Lead     Disposition: Follow up with Dr. Kennyth or EP APP in 4 weeks   Signed, Chantal Needle, NP  11/27/24  9:45 AM  Electrophysiology [facility] "

## 2024-11-27 ENCOUNTER — Encounter: Payer: Self-pay | Admitting: Cardiology

## 2024-11-27 ENCOUNTER — Ambulatory Visit: Admitting: Cardiology

## 2024-11-27 VITALS — BP 120/60 | HR 106 | Ht 66.0 in | Wt 125.0 lb

## 2024-11-27 DIAGNOSIS — I959 Hypotension, unspecified: Secondary | ICD-10-CM

## 2024-11-27 DIAGNOSIS — I4819 Other persistent atrial fibrillation: Secondary | ICD-10-CM | POA: Diagnosis not present

## 2024-11-27 DIAGNOSIS — I10 Essential (primary) hypertension: Secondary | ICD-10-CM | POA: Diagnosis not present

## 2024-11-27 DIAGNOSIS — Z5181 Encounter for therapeutic drug level monitoring: Secondary | ICD-10-CM | POA: Diagnosis not present

## 2024-11-27 DIAGNOSIS — I495 Sick sinus syndrome: Secondary | ICD-10-CM | POA: Diagnosis not present

## 2024-11-27 DIAGNOSIS — D6869 Other thrombophilia: Secondary | ICD-10-CM

## 2024-11-27 DIAGNOSIS — Z79899 Other long term (current) drug therapy: Secondary | ICD-10-CM

## 2024-11-27 NOTE — Patient Instructions (Addendum)
" °  Medication Instructions:  Your physician recommends the following medication changes.  Continue: Amiodarone  Continue amiodarone  200mg  twice a day for 2 more weeks, then reduce back to 200mg  once a day.  STOP TAKING: Amlodipine   Hold:  doxazosin  if BP top number (systolic) is less than 120  INCREASE: Metoprolol  to Increase metoprolol  to: 1/2 pill in morning, 1 whole pill in evening for 1 week.  If you don't feel worse - increase metoprolol  to a whole pill in AM and PM. *If you need a refill on your cardiac medications before your next appointment, please call your pharmacy*  Lab Work: No labs ordered today    Testing/Procedures: No test ordered today   Follow-Up: Please let us  know if anything changes.  Increase salt intake.   At [facility], you and your health needs are our priority.  As part of our continuing mission to provide you with exceptional heart care, our providers are all part of one team.  This team includes your primary Cardiologist (physician) and Advanced Practice Providers or APPs (Physician Assistants and Nurse Practitioners) who all work together to provide you with the care you need, when you need it.  Your next appointment:   4-6 week(s)  Provider:   Suzann Riddle, NP       Increase metoprolol  to: 1/2 pill in morning, 1 whole pill in evening for 1 week.  If you don't feel worse - increase metoprolol  to a whole pill in AM and PM.  Continue amiodarone  200mg  twice a day for 2 more weeks, then reduce back to 200mg  once a day.  Do not restart amlodipine    Skip doxazosin  if BP top number (systolic) is less than 120   "

## 2024-12-25 ENCOUNTER — Ambulatory Visit: Admitting: Cardiology

## 2025-01-07 ENCOUNTER — Ambulatory Visit: Admitting: Family Medicine

## 2025-01-14 ENCOUNTER — Ambulatory Visit: Admitting: Cardiology
